# Patient Record
Sex: Female | Born: 1940
Health system: Southern US, Community
[De-identification: ages and names within clinical notes are randomized; demographics above are authoritative.]

## PROBLEM LIST (undated history)

## (undated) DIAGNOSIS — I152 Hypertension secondary to endocrine disorders: Secondary | ICD-10-CM

## (undated) DIAGNOSIS — G4730 Sleep apnea, unspecified: Secondary | ICD-10-CM

## (undated) DIAGNOSIS — K219 Gastro-esophageal reflux disease without esophagitis: Secondary | ICD-10-CM

## (undated) DIAGNOSIS — E785 Hyperlipidemia, unspecified: Secondary | ICD-10-CM

## (undated) DIAGNOSIS — E559 Vitamin D deficiency, unspecified: Secondary | ICD-10-CM

## (undated) DIAGNOSIS — I10 Essential (primary) hypertension: Secondary | ICD-10-CM

## (undated) DIAGNOSIS — E1159 Type 2 diabetes mellitus with other circulatory complications: Secondary | ICD-10-CM

## (undated) DIAGNOSIS — M2550 Pain in unspecified joint: Secondary | ICD-10-CM

## (undated) DIAGNOSIS — M810 Age-related osteoporosis without current pathological fracture: Secondary | ICD-10-CM

## (undated) DIAGNOSIS — F329 Major depressive disorder, single episode, unspecified: Secondary | ICD-10-CM

## (undated) DIAGNOSIS — G8929 Other chronic pain: Secondary | ICD-10-CM

## (undated) DIAGNOSIS — E1169 Type 2 diabetes mellitus with other specified complication: Secondary | ICD-10-CM

## (undated) DIAGNOSIS — I509 Heart failure, unspecified: Secondary | ICD-10-CM

## (undated) DIAGNOSIS — F419 Anxiety disorder, unspecified: Secondary | ICD-10-CM

## (undated) DIAGNOSIS — E7800 Pure hypercholesterolemia, unspecified: Secondary | ICD-10-CM

## (undated) DIAGNOSIS — E039 Hypothyroidism, unspecified: Secondary | ICD-10-CM

## (undated) DIAGNOSIS — F32A Depression, unspecified: Secondary | ICD-10-CM

## (undated) DIAGNOSIS — M1990 Unspecified osteoarthritis, unspecified site: Secondary | ICD-10-CM

## (undated) HISTORY — DX: Pain in unspecified joint: M25.50

## (undated) HISTORY — DX: Essential (primary) hypertension: I10

## (undated) HISTORY — DX: Heart failure, unspecified: I50.9

## (undated) HISTORY — DX: Age-related osteoporosis without current pathological fracture: M81.0

## (undated) HISTORY — DX: Anxiety disorder, unspecified: F41.9

## (undated) HISTORY — PX: BREAST EXCISIONAL BIOPSY: SUR124

## (undated) HISTORY — DX: Type 2 diabetes mellitus with other specified complication: E11.69

## (undated) HISTORY — DX: Unspecified osteoarthritis, unspecified site: M19.90

## (undated) HISTORY — DX: Type 2 diabetes mellitus with other specified complication: E78.00

## (undated) HISTORY — DX: Type 2 diabetes mellitus with other circulatory complications: E11.59

## (undated) HISTORY — DX: Gastro-esophageal reflux disease without esophagitis: K21.9

## (undated) HISTORY — PX: ABDOMINAL HERNIA REPAIR: SHX539

## (undated) HISTORY — DX: Vitamin D deficiency, unspecified: E55.9

## (undated) HISTORY — DX: Other chronic pain: G89.29

## (undated) HISTORY — DX: Hyperlipidemia, unspecified: E78.5

## (undated) HISTORY — DX: Hypertension secondary to endocrine disorders: I15.2

---

## 1975-12-26 HISTORY — PX: ABDOMINAL HYSTERECTOMY: SHX81

## 1999-03-25 ENCOUNTER — Ambulatory Visit (HOSPITAL_COMMUNITY): Admission: RE | Admit: 1999-03-25 | Discharge: 1999-03-25 | Payer: Self-pay | Admitting: Gastroenterology

## 2001-07-29 ENCOUNTER — Ambulatory Visit (HOSPITAL_BASED_OUTPATIENT_CLINIC_OR_DEPARTMENT_OTHER): Admission: RE | Admit: 2001-07-29 | Discharge: 2001-07-29 | Payer: Self-pay | Admitting: General Surgery

## 2001-07-29 ENCOUNTER — Encounter (INDEPENDENT_AMBULATORY_CARE_PROVIDER_SITE_OTHER): Payer: Self-pay | Admitting: *Deleted

## 2001-12-13 ENCOUNTER — Encounter: Admission: RE | Admit: 2001-12-13 | Discharge: 2001-12-13 | Payer: Self-pay | Admitting: Family Medicine

## 2001-12-13 ENCOUNTER — Encounter: Payer: Self-pay | Admitting: Family Medicine

## 2001-12-13 ENCOUNTER — Encounter (INDEPENDENT_AMBULATORY_CARE_PROVIDER_SITE_OTHER): Payer: Self-pay

## 2002-02-25 ENCOUNTER — Encounter: Admission: RE | Admit: 2002-02-25 | Discharge: 2002-02-25 | Payer: Self-pay | Admitting: Family Medicine

## 2002-02-25 ENCOUNTER — Encounter: Payer: Self-pay | Admitting: Family Medicine

## 2002-03-06 ENCOUNTER — Encounter (INDEPENDENT_AMBULATORY_CARE_PROVIDER_SITE_OTHER): Payer: Self-pay | Admitting: Specialist

## 2002-03-06 ENCOUNTER — Ambulatory Visit (HOSPITAL_COMMUNITY): Admission: RE | Admit: 2002-03-06 | Discharge: 2002-03-06 | Payer: Self-pay | Admitting: Gastroenterology

## 2002-12-15 ENCOUNTER — Encounter: Admission: RE | Admit: 2002-12-15 | Discharge: 2002-12-15 | Payer: Self-pay | Admitting: Family Medicine

## 2002-12-15 ENCOUNTER — Encounter: Payer: Self-pay | Admitting: Family Medicine

## 2004-01-01 ENCOUNTER — Encounter: Admission: RE | Admit: 2004-01-01 | Discharge: 2004-01-01 | Payer: Self-pay | Admitting: Family Medicine

## 2005-01-16 ENCOUNTER — Encounter: Admission: RE | Admit: 2005-01-16 | Discharge: 2005-01-16 | Payer: Self-pay | Admitting: Family Medicine

## 2006-02-21 ENCOUNTER — Encounter: Admission: RE | Admit: 2006-02-21 | Discharge: 2006-02-21 | Payer: Self-pay | Admitting: Family Medicine

## 2007-02-26 ENCOUNTER — Encounter: Admission: RE | Admit: 2007-02-26 | Discharge: 2007-02-26 | Payer: Self-pay | Admitting: Unknown Physician Specialty

## 2008-02-27 ENCOUNTER — Encounter: Admission: RE | Admit: 2008-02-27 | Discharge: 2008-02-27 | Payer: Self-pay | Admitting: Family Medicine

## 2008-03-09 ENCOUNTER — Ambulatory Visit: Payer: Self-pay | Admitting: Cardiology

## 2008-03-09 LAB — CONVERTED CEMR LAB: Pro B Natriuretic peptide (BNP): 61 pg/mL (ref 0.0–100.0)

## 2008-03-23 ENCOUNTER — Encounter: Payer: Self-pay | Admitting: Cardiology

## 2008-03-23 ENCOUNTER — Ambulatory Visit: Payer: Self-pay

## 2008-04-15 ENCOUNTER — Ambulatory Visit: Payer: Self-pay | Admitting: Cardiology

## 2009-03-01 ENCOUNTER — Encounter: Admission: RE | Admit: 2009-03-01 | Discharge: 2009-03-01 | Payer: Self-pay | Admitting: Family Medicine

## 2010-03-02 ENCOUNTER — Encounter: Admission: RE | Admit: 2010-03-02 | Discharge: 2010-03-02 | Payer: Self-pay | Admitting: Unknown Physician Specialty

## 2011-02-09 ENCOUNTER — Other Ambulatory Visit: Payer: Self-pay | Admitting: Gastroenterology

## 2011-02-20 ENCOUNTER — Other Ambulatory Visit: Payer: Self-pay | Admitting: Family Medicine

## 2011-02-20 DIAGNOSIS — Z1231 Encounter for screening mammogram for malignant neoplasm of breast: Secondary | ICD-10-CM

## 2011-03-07 ENCOUNTER — Ambulatory Visit
Admission: RE | Admit: 2011-03-07 | Discharge: 2011-03-07 | Disposition: A | Discharge status: Home / Self Care | Payer: BC Managed Care – PPO | Source: Ambulatory Visit | Attending: Family Medicine | Admitting: Family Medicine

## 2011-03-07 DIAGNOSIS — Z1231 Encounter for screening mammogram for malignant neoplasm of breast: Secondary | ICD-10-CM

## 2011-05-09 NOTE — Assessment & Plan Note (Signed)
[facility]                            CARDIOLOGY OFFICE NOTE   NAME:Danielle Parrish, Danielle Parrish                       MRN:          [id number]  DATE:03/09/2008                            DOB:          11/29/1941    PRIMARY:  Danielle Parrish, P.A., [facility].   REASON FOR REFERRAL:  Evaluate patient with new onset dyspnea.   HISTORY OF PRESENT ILLNESS:  The patient is a pleasant 70 year old who  did have a stress test, she reports, many years ago in our office.  This  was apparently a nuclear study, and she thinks it was normal.  I do not  have these results.  She never had any other cardiac history.  She  noticed shortness of breath and decreased exercise tolerance for the  past couple of weeks.  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  She was working with  patients and had to sit down after doing her usual routine.  That day,  she left work early because of this tiredness, and since then she has  continued to have dyspnea with minimal exertion such as walking a level  distance in her house.  She is not describing resting shortness of  breath and has not had any PND or orthopnea.  She has felt very  fatigued.  She has not been having any chest discomfort, neck or arm  discomfort.  She has had no palpitations, presyncope or syncope.  The  patient has not had these symptoms before.  She has had some increased  abdominal swelling but has not had any overall weight gain or lower  extremity swelling.  She has had no fevers, chills or cough.  She does  have sleep apnea and has been tolerating her CPAP and has had no change  in this.   She was recently treated with Norvasc to treat her blood pressure.  She  said this dropped her pressures down to the one teens, and she stopped  this on her own.   PAST MEDICAL HISTORY:  1. Hypertension x40 years.  2. Hypothyroidism.  3.  Hyperlipidemia.  4. Sleep apnea treated with CPAP.   PAST SURGICAL HISTORY:  1. Hysterectomy.  2. Hernia repair.  3. Breast biopsy.   ALLERGIES:  None.   MEDICATIONS:  1. Zoloft 200 mg daily.  2. Benazepril 40 mg daily.  3. Atenolol 100 mg daily.  4. Synthroid 100 mcg daily.  5. Lovastatin 40 mg q.h.s.  6. Niaspan 500 mg q.h.s.  7. Hydrochlorothiazide 25 mg daily.  8. Aspirin 81 mg daily.  9. Fish oil.  10.Iron.  11.Calcium.   SOCIAL HISTORY:  The patient is a CNA.  She is widowed.  She has 2  children.  She has never smoked cigarettes, does not drink alcohol.   FAMILY HISTORY:  Noncontributory for early coronary artery disease.   REVIEW OF SYSTEMS:  As stated in the HPI and otherwise negative for  other systems.   PHYSICAL EXAMINATION:  GENERAL:  The patient  is in no distress.  VITAL SIGNS:  Blood pressure 186/89, heart rate 52 and regular, weight  194 pounds, body mass index 34.  HEENT:  Eyelids are unremarkable.  Pupils equal, round and reactive to  light.  Fundi within normal limits.  Oral mucosa unremarkable.  NECK:  No jugular distention at 45 degrees.  Carotid upstroke brisk and  symmetric.  No bruits, no thyromegaly.  LYMPHATICS:  No cervical, axillary or inguinal adenopathy.  LUNGS:  Clear to auscultation bilaterally.  BACK:  No costovertebral angle tenderness.  CHEST:  Unremarkable.  HEART:  PMI not displaced or sustained.  S1-S2 within normal limits.  No  S3, no S4, no clicks, rubs or murmurs.  ABDOMEN:  Obese, positive bowel sounds.  Normal in frequency and pitch.  No bruits, rebound, guarding.  No midline pulsatile mass.  No  hepatomegaly, no splenomegaly.  SKIN:  No rashes, no nodules.  EXTREMITIES:  2+ pulses throughout.  No edema, no cyanosis, no clubbing.  NEUROLOGIC:  Oriented to person, place and time.  Cranial nerves II-XII  grossly intact.  Motor grossly intact.   ELECTROCARDIOGRAM:  Sinus bradycardia, rate 52.  Axis within normal  limits,  intervals within normal limits.  No acute ST-T wave changes.   ASSESSMENT/PLAN:  1. Acute dyspnea.  The patient had the sudden onset of dyspnea without      other obvious change.  At this point, this could be an anginal      equivalent.  I am going to order a stress perfusion study.  She      would not be able to walk on a treadmill, so this will have to be      an adenosine Cardiolite.  I will check a BNP level, as well.  I      will also check an echocardiogram.  There is some vague history of      some heart failure but apparently with a preserved ejection      fraction.  I suspect that she probably has some diastolic      dysfunction, given her hypertension.  Further evaluation will be      based on these results.  2. Hypertension.  Blood pressure is elevated today.  I have encouraged      her to restart 2.5 mg of Norvasc which she was given.  3. Obesity.  She understands the need to lose weight with diet and      exercise, and we will talk about this again in the future.  4. Dyslipidemia.  This is under careful follow up of [facility].  5. Sleep apnea.  She continues to wear a CPAP.  6. Follow up will be after the results of the above.     Rollene Rotunda, MD, [facility]  Electronically Signed    JH/MedQ  DD: 03/09/2008  DT: 03/09/2008  Job #: [id number]   cc:   Danielle Parrish, P.A.

## 2011-05-09 NOTE — Assessment & Plan Note (Signed)
[facility]                            CARDIOLOGY OFFICE NOTE   NAME:Danielle Parrish, Danielle Parrish                       MRN:          [id number]  DATE:04/15/2008                            DOB:          [birth year]    PRIMARY CARE:  [name], P.A., [facility].   REASON FOR PRESENTATION:  Evaluate patient with dyspnea.   HISTORY OF PRESENT ILLNESS:  This is the second office visit for this  lovely, 70 year old white female, with dyspnea as described in the  previous note.  After that visit, I did perform an echocardiogram, which  demonstrated an EF of 55-65%.  There was some very mild aortic valve  thickness with a slight gradient and some trivial aortic insufficiency.  She had a stress perfusion study, which suggested possibly apical  ischemia, but a well preserved ejection fraction.  Her BNP level was 61.  Since  that time the patient has had none of the acute symptoms that she  had the evening at work.  She thinks her breathing has slowly gotten  better.  She did start her Norvasc and has had much better blood  pressure control.  She has not had any acute episodes of resting  shortness of breath.  She denies any PND or orthopnea.  She has had no  palpitations, no presyncope or syncope.  She denies any chest  discomfort, neck or arm discomfort.   PAST MEDICAL HISTORY:  Hypertension x40 years, hypothyroidism,  hyperlipidemia, sleep apnea treated with CPAP, hysterectomy, hernia  repair, breast biopsy.   ALLERGIES:  None.   MEDICATIONS:  1. Zoloft 200 mg daily.  2. Benazepril 40 mg daily.  3. Atenolol 100 mg daily.  4. Synthroid 100 mcg daily.  5. Lovastatin 80 mg daily.  6. Niaspan 500 mg nightly.  7. Hydrochlorothiazide 25 mg daily.  8. Aspirin 81 mg daily.  9. Fish oil,  10.Iron.  11.Calcium.  12.CPAP.  13.Norvasc 2.5 mg daily.   REVIEW OF SYSTEMS:  As stated in the HPI.  Negative for other systems.   PHYSICAL  EXAMINATION:  Patient is in no distress.  Blood pressure  110/68, heart rate 64, regular.  Weight 193 pounds.  Body mass index 34.  HEENT: Eyes unremarkable, pupils equal, round and reactive to light.  Fundi not visualized.  Oral mucosa unremarkable.  NECK:  No jugular venous distension at 45 degrees, carotid upstroke  brisk and symmetric.  No bruits, no thyromegaly.  LYMPHATICS:  No cervical, axillary, inguinal adenopathy.  LUNGS:  Clear to auscultation bilaterally.  BACK: No costovertebral angle tenderness.  CHEST:  Unremarkable.  HEART:  PMI not displaced or sustained.  S1, S2 within normal limits  No  S3, no S4, clicks, rubs or murmurs.  ABDOMEN:  Obese.  Positive bowel sounds normal in frequency and pitch.  No bruits, rebound, no guarding.  No midline pulsatile mass.  No  organomegaly.  SKIN:  No rashes.  EXTREMITIES:  2+ pulses.  No edema.   ASSESSMENT/PLAN:  1. Dyspnea.  The patient's dyspnea does not have a cardiac etiology  aside from the possibility of some mild diastolic dysfunction.  It      could be related to weight, sedentary lifestyle.  At this point no      further cardiovascular testing or specific therapy is warranted.  I      did suggest to her that she might want to see a pulmonologist.  She      said she did have a chest x-ray, which she thinks was normal.  I      asked her to follow up on this.  She will otherwise see Mr. Jacqulyn Bath to      consider any further evaluation or management.  2. Aortic stenosis.  Patient has a very mild aortic stenosis.  This      can be followed clinically and with repeat echocardiograms in a few      years.  3. Obesity.  She understands the need to lose weight with diet and      exercise.  4. Will be back in this clinic as needed.     Rollene Rotunda, MD, [facility]  Electronically Signed    JH/MedQ  DD: 04/15/2008  DT: 04/15/2008  Job #: [id number]

## 2011-05-12 NOTE — Op Note (Signed)
[place]  Patient:    Danielle Parrish, Danielle Parrish                       MRN: [id number] Proc. Date: 07/29/01 Adm. Date:  [id number] Attending:  Brandy Hale CC:         Gweneth Dimitri, M.D., [facility]   Operative Report  PREOPERATIVE DIAGNOSIS:  Cyst of scalp.  POSTOPERATIVE DIAGNOSIS:  Cyst of scalp.  PROCEDURE:  Excision of 2.0 cm diameter cyst of scalp.  SURGEON:  Angelia Mould. Derrell Lolling, M.D.  OPERATIVE INDICATION:  This is a 70 year old white female who has had a mass on the scalp at the vertex for many, many years.  This has never been infected, but it has been enlarging and has become more tender lately.  On examination, she has a 2-2.5 cm prominent subcutaneous mass at the vertex of the scalp consistent with a benign chronic subcutaneous cyst.  There is no other skin change, but this is quite large.  She is brought to the operating room electively.  DESCRIPTION OF PROCEDURE:  The patient was brought to [facility] day surgery center to the minor surgery room.  She was placed prone on the operating table with appropriate pillows for cushioning.  The hair was shaved around the prominent mass at the vertex of the scalp.  This was prepped and draped in a sterile fashion.  Xylocaine 1% with epinephrine was used as a local infiltration anesthetic.  A sagittally-oriented elliptical incision was made.  Using sharp dissection I excised a large, thick-walled, chronic sebaceous cyst.  This was sent to pathology for routine histology.  There was a fair amount of bleeding from the scalp vessels.  This required suture ligature of one of these vessels with a 4-0 Vicryl suture ligature.  Electrocautery was used to control the rest of the bleeding.  At the completion of the case there was excellent hemostasis.  The wound was irrigated.  The skin was closed with a running horizontal mattress suture of 3-0 nylon, and Durabond was used to cover  the skin.  The patient tolerated the procedure well and was returned to the waiting room in excellent condition.  Estimated blood loss was about 30 cc. Complications:  None.  Sponge, needle, and instrument counts were correct. DD:  07/29/01 TD:  07/30/01 Job: [id number]

## 2011-05-12 NOTE — Procedures (Signed)
[facility]  Patient:    Danielle Parrish, Danielle Parrish Visit Number: [id number] MRN: [id number]          Service Type: END Location: ENDO Attending Physician:  Louie Bun Dictated by:   Everardo All Madilyn Fireman, M.D. Proc. Date: 03/06/02 Admit Date:  03/06/2002   CC:         Gweneth Dimitri, M.D.   Procedure Report  PROCEDURE:  Colonoscopy with polypectomy.  INDICATION FOR PROCEDURE:  Rectal bleeding in a 70 year old patient with no previous colon screening.  DESCRIPTION OF PROCEDURE:  The patient was placed in the left lateral decubitus position and placed on the pulse monitor with continuous low-flow oxygen delivered by nasal cannula.  She was sedated with 50 mg of IV Demerol and 5 mg of IV Versed.  The Olympus video colonoscope was inserted into the rectum and advanced to the cecum, confirmed by transillumination at McBurneys point and visualization of the ileocecal valve and appendiceal orifice.  The prep was excellent.  The cecum and ascending colon appeared normal with no no masses, polyps, diverticula, or other mucosal abnormalities.  Within the transverse colon, there was a superficial polyp which was removed by hot biopsy.  This was approximately 6 mm in diameter.  The remainder of the transverse, descending, and sigmoid colon appeared normal with no further polyps, masses, diverticula, or other mucosa abnormalities.  The rectum likewise appeared normal, and retroflexed view of the anus revealed small internal hemorrhoids.  The colonoscope was then withdrawn, and the patient returned to the recovery room in stable condition.  She tolerated the procedure well, and there were no immediate complications.  IMPRESSION: 1. Small transverse colon polyp. 2. Internal hemorrhoids.  PLAN:  Treat hemorrhoids symptomatically and await histology on the polyp for determination of method and interval for future colons screening. Dictated by:   Everardo All Madilyn Fireman,  M.D. Attending Physician:  Louie Bun DD:  03/06/02 TD:  03/08/02 Job: [id number]

## 2012-03-20 ENCOUNTER — Other Ambulatory Visit: Payer: Self-pay | Admitting: Family Medicine

## 2012-03-20 DIAGNOSIS — Z1231 Encounter for screening mammogram for malignant neoplasm of breast: Secondary | ICD-10-CM

## 2012-04-10 ENCOUNTER — Ambulatory Visit
Admission: RE | Admit: 2012-04-10 | Discharge: 2012-04-10 | Disposition: A | Discharge status: Home / Self Care | Payer: BC Managed Care – PPO | Source: Ambulatory Visit | Attending: Family Medicine | Admitting: Family Medicine

## 2012-04-10 DIAGNOSIS — Z1231 Encounter for screening mammogram for malignant neoplasm of breast: Secondary | ICD-10-CM

## 2012-04-12 ENCOUNTER — Other Ambulatory Visit: Payer: Self-pay | Admitting: Family Medicine

## 2012-04-12 DIAGNOSIS — R928 Other abnormal and inconclusive findings on diagnostic imaging of breast: Secondary | ICD-10-CM

## 2012-04-16 DIAGNOSIS — H538 Other visual disturbances: Secondary | ICD-10-CM | POA: Diagnosis not present

## 2012-04-16 DIAGNOSIS — H2510 Age-related nuclear cataract, unspecified eye: Secondary | ICD-10-CM | POA: Diagnosis not present

## 2012-04-22 ENCOUNTER — Other Ambulatory Visit: Payer: Self-pay | Admitting: Family Medicine

## 2012-04-22 ENCOUNTER — Ambulatory Visit
Admission: RE | Admit: 2012-04-22 | Discharge: 2012-04-22 | Disposition: A | Discharge status: Home / Self Care | Payer: BC Managed Care – PPO | Source: Ambulatory Visit | Attending: Family Medicine | Admitting: Family Medicine

## 2012-04-22 DIAGNOSIS — N63 Unspecified lump in unspecified breast: Secondary | ICD-10-CM | POA: Diagnosis not present

## 2012-04-22 DIAGNOSIS — R928 Other abnormal and inconclusive findings on diagnostic imaging of breast: Secondary | ICD-10-CM

## 2012-05-01 ENCOUNTER — Ambulatory Visit
Admission: RE | Admit: 2012-05-01 | Discharge: 2012-05-01 | Disposition: A | Discharge status: Home / Self Care | Payer: BC Managed Care – PPO | Source: Ambulatory Visit | Attending: Family Medicine | Admitting: Family Medicine

## 2012-05-01 ENCOUNTER — Ambulatory Visit
Admission: RE | Admit: 2012-05-01 | Discharge: 2012-05-01 | Disposition: A | Discharge status: Home / Self Care | Payer: Medicare Other | Source: Ambulatory Visit | Attending: Family Medicine | Admitting: Family Medicine

## 2012-05-01 DIAGNOSIS — N63 Unspecified lump in unspecified breast: Secondary | ICD-10-CM | POA: Diagnosis not present

## 2012-05-01 DIAGNOSIS — D249 Benign neoplasm of unspecified breast: Secondary | ICD-10-CM | POA: Diagnosis not present

## 2012-05-10 DIAGNOSIS — E785 Hyperlipidemia, unspecified: Secondary | ICD-10-CM | POA: Diagnosis not present

## 2012-05-10 DIAGNOSIS — I10 Essential (primary) hypertension: Secondary | ICD-10-CM | POA: Diagnosis not present

## 2012-05-10 DIAGNOSIS — E119 Type 2 diabetes mellitus without complications: Secondary | ICD-10-CM | POA: Diagnosis not present

## 2012-05-10 DIAGNOSIS — E039 Hypothyroidism, unspecified: Secondary | ICD-10-CM | POA: Diagnosis not present

## 2012-08-09 DIAGNOSIS — I10 Essential (primary) hypertension: Secondary | ICD-10-CM | POA: Diagnosis not present

## 2012-08-09 DIAGNOSIS — E119 Type 2 diabetes mellitus without complications: Secondary | ICD-10-CM | POA: Diagnosis not present

## 2013-02-17 DIAGNOSIS — R5381 Other malaise: Secondary | ICD-10-CM | POA: Diagnosis not present

## 2013-02-17 DIAGNOSIS — E785 Hyperlipidemia, unspecified: Secondary | ICD-10-CM | POA: Diagnosis not present

## 2013-02-17 DIAGNOSIS — I10 Essential (primary) hypertension: Secondary | ICD-10-CM | POA: Diagnosis not present

## 2013-02-17 DIAGNOSIS — R5383 Other fatigue: Secondary | ICD-10-CM | POA: Diagnosis not present

## 2013-02-26 DIAGNOSIS — M79609 Pain in unspecified limb: Secondary | ICD-10-CM | POA: Diagnosis not present

## 2013-02-26 DIAGNOSIS — I10 Essential (primary) hypertension: Secondary | ICD-10-CM | POA: Diagnosis not present

## 2013-02-26 DIAGNOSIS — M7120 Synovial cyst of popliteal space [Baker], unspecified knee: Secondary | ICD-10-CM | POA: Diagnosis not present

## 2013-03-27 ENCOUNTER — Telehealth: Payer: Self-pay | Admitting: Family Medicine

## 2013-04-01 ENCOUNTER — Other Ambulatory Visit: Payer: Self-pay | Admitting: Family Medicine

## 2013-04-01 ENCOUNTER — Telehealth: Payer: Self-pay

## 2013-04-01 MED ORDER — HYDROCODONE-ACETAMINOPHEN 7.5-325 MG PO TABS: 1.0000 | ORAL_TABLET | Freq: Four times a day (QID) | ORAL | Status: DC | PRN

## 2013-04-01 NOTE — Telephone Encounter (Signed)
Spoke to grandson informed him her rx  hydrocodone ready for pick up He stated he would give her the message

## 2013-04-01 NOTE — Telephone Encounter (Signed)
Spoke with pt results given  appt with dr Modesto Charon Apr 27 2013

## 2013-04-01 NOTE — Telephone Encounter (Signed)
Noncompliant with follow up appointments. Needs OV to check his BP.

## 2013-04-01 NOTE — Telephone Encounter (Signed)
Last seen 02/17/13

## 2013-04-03 NOTE — Telephone Encounter (Signed)
Spoke wqith pt stated she already picked up rx and does not need another rx  Dr Modesto Charon aware.

## 2013-04-07 ENCOUNTER — Encounter: Payer: Self-pay | Admitting: Family Medicine

## 2013-04-28 ENCOUNTER — Other Ambulatory Visit: Payer: Self-pay | Admitting: *Deleted

## 2013-04-28 MED ORDER — HYDROCODONE-ACETAMINOPHEN 7.5-325 MG PO TABS: 1.0000 | ORAL_TABLET | Freq: Four times a day (QID) | ORAL | Status: DC | PRN

## 2013-04-28 NOTE — Telephone Encounter (Signed)
Patient last seen in office on 2-24.  Rx last filled on 4-9 for #45. Please advise. If approved please have nurse phone in to [facility] pharmacy. Thank you

## 2013-05-01 ENCOUNTER — Telehealth: Payer: Self-pay | Admitting: Family Medicine

## 2013-05-02 NOTE — Telephone Encounter (Signed)
rx at front desk and was written on may 5 Pt aware rx ready for pick up

## 2013-05-15 ENCOUNTER — Ambulatory Visit (INDEPENDENT_AMBULATORY_CARE_PROVIDER_SITE_OTHER): Payer: Commercial Indemnity | Admitting: Family Medicine

## 2013-05-15 ENCOUNTER — Encounter: Payer: Self-pay | Admitting: Family Medicine

## 2013-05-15 VITALS — BP 154/78 | HR 54 | Temp 97.4°F | Ht 62.0 in | Wt 177.0 lb

## 2013-05-15 DIAGNOSIS — E669 Obesity, unspecified: Secondary | ICD-10-CM

## 2013-05-15 DIAGNOSIS — I10 Essential (primary) hypertension: Secondary | ICD-10-CM

## 2013-05-15 DIAGNOSIS — R7309 Other abnormal glucose: Secondary | ICD-10-CM

## 2013-05-15 DIAGNOSIS — E1159 Type 2 diabetes mellitus with other circulatory complications: Secondary | ICD-10-CM | POA: Insufficient documentation

## 2013-05-15 DIAGNOSIS — R7303 Prediabetes: Secondary | ICD-10-CM

## 2013-05-15 DIAGNOSIS — E1169 Type 2 diabetes mellitus with other specified complication: Secondary | ICD-10-CM | POA: Insufficient documentation

## 2013-05-15 DIAGNOSIS — E785 Hyperlipidemia, unspecified: Secondary | ICD-10-CM

## 2013-05-15 DIAGNOSIS — I152 Hypertension secondary to endocrine disorders: Secondary | ICD-10-CM | POA: Insufficient documentation

## 2013-05-15 DIAGNOSIS — E039 Hypothyroidism, unspecified: Secondary | ICD-10-CM | POA: Diagnosis not present

## 2013-05-15 LAB — COMPLETE METABOLIC PANEL WITH GFR
ALT: 23 U/L (ref 0–35)
AST: 25 U/L (ref 0–37)
Albumin: 4.3 g/dL (ref 3.5–5.2)
Alkaline Phosphatase: 43 U/L (ref 39–117)
BUN: 10 mg/dL (ref 6–23)
CO2: 27 mEq/L (ref 19–32)
Calcium: 9.8 mg/dL (ref 8.4–10.5)
Chloride: 101 mEq/L (ref 96–112)
Creat: 0.86 mg/dL (ref 0.50–1.10)
GFR, Est African American: 78 mL/min
GFR, Est Non African American: 68 mL/min
Glucose, Bld: 101 mg/dL — ABNORMAL HIGH (ref 70–99)
Potassium: 4.1 mEq/L (ref 3.5–5.3)
Sodium: 138 mEq/L (ref 135–145)
Total Bilirubin: 0.5 mg/dL (ref 0.3–1.2)
Total Protein: 6.6 g/dL (ref 6.0–8.3)

## 2013-05-15 LAB — POCT GLYCOSYLATED HEMOGLOBIN (HGB A1C): Hemoglobin A1C: 5.8

## 2013-05-15 LAB — TSH: TSH: 1.917 u[IU]/mL (ref 0.350–4.500)

## 2013-05-15 MED ORDER — FENOFIBRATE 160 MG PO TABS: 160.0000 mg | ORAL_TABLET | Freq: Every day | ORAL | Status: DC

## 2013-05-15 NOTE — Progress Notes (Signed)
Patient ID: Danielle Parrish, female   DOB: [birth year], 72 y.o.   MRN: [id number] SUBJECTIVE: HPI: Patient is here for follow up of hypertension, Hypertension/Prediabetes:  denies Headache;deniesChest Pain;denies weakness;denies Shortness of Breath or Orthopnea;denies Visual changes;denies palpitations;denies cough;denies pedal edema;denies symptoms of TIA or stroke; admits to Compliance with medications. denies Problems with medications.  Quit drinking Dr Alcus Dad. And this  Seems to have helped with her weight loss. The legs don't hurt anymore. And she feels better.  PMH/PSH: reviewed/updated in Epic  SH/FH: reviewed/updated in Epic  Allergies: reviewed/updated in Epic  Medications: reviewed/updated in Epic  Immunizations: reviewed/updated in Epic  ROS: As above in the HPI. All other systems are stable or negative.  OBJECTIVE: APPEARANCE:  Patient in no acute distress.The patient appeared well nourished and normally developed. Acyanotic. Waist: VITAL SIGNS:BP 154/78  Pulse 54  Temp(Src) 97.4 F (36.3 C) (Oral)  Ht 5\' 2"  (1.575 m)  Wt 177 lb (80.287 kg)  BMI 32.37 kg/m2  140/84 recheck WF Obese  Weight reduced 12 lbs.  SKIN: warm and  Dry without overt rashes, tattoos and scars  HEAD and Neck: without JVD, Head and scalp: normal Eyes:No scleral icterus. Fundi normal, eye movements normal. Ears: Auricle normal, canal normal, Tympanic membranes normal, insufflation normal. Nose: normal Throat: normal Neck & thyroid: normal  CHEST & LUNGS: Chest wall: normal Lungs: Clear  CVS: Reveals the PMI to be normally located. Regular rhythm, First and Second Heart sounds are normal,  absence of murmurs, rubs or gallops. Peripheral vasculature: Radial pulses: normal Dorsal pedis pulses: normal Posterior pulses: normal  ABDOMEN:  Appearance: obese soft Benign,, no organomegaly, no masses, no Abdominal Aortic enlargement. No Guarding , no rebound. No Bruits. Bowel  sounds: normal  RECTAL: N/A GU: N/A  EXTREMETIES: nonedematous. Both Femoral and Pedal pulses are normal.  MUSCULOSKELETAL:  Spine:mild kyphosis Joints: intact  NEUROLOGIC: oriented to time,place and person; nonfocal. Strength is normal Sensory is normal  ASSESSMENT: HTN (hypertension) - Plan: COMPLETE METABOLIC PANEL WITH GFR  HLD (hyperlipidemia) - Plan: COMPLETE METABOLIC PANEL WITH GFR, NMR Lipoprofile with Lipids, fenofibrate 160 MG tablet  Prediabetes - Plan: COMPLETE METABOLIC PANEL WITH GFR, POCT glycosylated hemoglobin (Hb A1C)  Obesity, unspecified  Unspecified hypothyroidism - Plan: TSH   PLAN: Orders Placed This Encounter  Procedures  . TSH  . COMPLETE METABOLIC PANEL WITH GFR  . NMR Lipoprofile with Lipids  . POCT glycosylated hemoglobin (Hb A1C)   Meds ordered this encounter  Medications  . atenolol (TENORMIN) 100 MG tablet    Sig: Take 100 mg by mouth daily.  . benazepril (LOTENSIN) 40 MG tablet    Sig: Take 40 mg by mouth daily.  Marland Kitchen DISCONTD: fenofibrate 160 MG tablet    Sig: Take 160 mg by mouth daily.  . sertraline (ZOLOFT) 100 MG tablet    Sig: Take 100 mg by mouth daily. Take 2 tabs by mouth every day  . levothyroxine (SYNTHROID, LEVOTHROID) 100 MCG tablet    Sig: Take 100 mcg by mouth daily before breakfast.  . omeprazole (PRILOSEC) 40 MG capsule    Sig: Take 40 mg by mouth daily.  . Multiple Vitamin (MULTIVITAMIN) tablet    Sig: Take 1 tablet by mouth daily.  . ergocalciferol (VITAMIN D2) 50000 UNITS capsule    Sig: Take 50,000 Units by mouth once a week.  . lovastatin (MEVACOR) 40 MG tablet    Sig: Take 40 mg by mouth at bedtime. Take 2 tablets by mouth at bedtime  .  amLODipine (NORVASC) 5 MG tablet    Sig: Take 5 mg by mouth daily.  Marland Kitchen aspirin 81 MG tablet    Sig: Take 81 mg by mouth daily.  . fenofibrate 160 MG tablet    Sig: Take 1 tablet (160 mg total) by mouth daily.    Dispense:  30 tablet    Refill:  11    Diet and exercise  discussed. Recommend trying to be vegetarian 2 to 3 times a week to see if this will help her to improve her weight loss and health. 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 Modesto Charon, M.D.

## 2013-05-15 NOTE — Progress Notes (Signed)
Patient ID: Danielle Parrish, female   DOB: January 08, 1941, 72 y.o.   MRN: [id number] Patient had not taken her BP meds last night. She was busy working so BP was elevated today.  Her BPs at work and home are normal 124/64 136/67 133 79 124/59 118/57  A.  HTN excellent control  P As in body of the visit note.  Shaquisha Wynn P. Modesto Charon, M.D.

## 2013-05-16 LAB — NMR LIPOPROFILE WITH LIPIDS
Cholesterol, Total: 150 mg/dL (ref <200)
HDL Particle Number: 39.5 umol/L (ref >=30.5)
HDL Size: 9.3 nm (ref >=9.2)
HDL-C: 48 mg/dL (ref >=40)
LDL (calc): 77 mg/dL (ref <100)
LDL Particle Number: 1061 nmol/L — ABNORMAL HIGH (ref <1000)
LDL Size: 20.3 nm — ABNORMAL LOW (ref >20.5)
LP-IR Score: 56 — ABNORMAL HIGH (ref <=45)
Large HDL-P: 4.8 umol/L (ref >=4.8)
Large VLDL-P: 3.9 nmol/L — ABNORMAL HIGH (ref <=2.7)
Small LDL Particle Number: 687 nmol/L — ABNORMAL HIGH (ref <=527)
Triglycerides: 123 mg/dL (ref <150)
VLDL Size: 51.5 nm — ABNORMAL HIGH (ref <=46.6)

## 2013-05-17 NOTE — Progress Notes (Signed)
Quick Note:  Lab result at goal. No change in Medications for now. No Change in plans and follow up. ______ 

## 2013-05-20 ENCOUNTER — Encounter: Payer: Self-pay | Admitting: *Deleted

## 2013-05-23 ENCOUNTER — Other Ambulatory Visit: Payer: Self-pay | Admitting: Family Medicine

## 2013-05-23 DIAGNOSIS — N6320 Unspecified lump in the left breast, unspecified quadrant: Secondary | ICD-10-CM

## 2013-05-26 ENCOUNTER — Other Ambulatory Visit: Payer: Self-pay

## 2013-05-26 MED ORDER — AMLODIPINE BESYLATE 5 MG PO TABS: 5.0000 mg | ORAL_TABLET | Freq: Every day | ORAL | Status: DC

## 2013-05-26 MED ORDER — LOVASTATIN 40 MG PO TABS: 40.0000 mg | ORAL_TABLET | Freq: Every day | ORAL | Status: DC

## 2013-05-26 MED ORDER — LEVOTHYROXINE SODIUM 100 MCG PO TABS: 100.0000 ug | ORAL_TABLET | Freq: Every day | ORAL | Status: DC

## 2013-06-04 ENCOUNTER — Ambulatory Visit
Admission: RE | Admit: 2013-06-04 | Discharge: 2013-06-04 | Disposition: A | Discharge status: Home / Self Care | Payer: Medicare Other | Source: Ambulatory Visit | Attending: Family Medicine | Admitting: Family Medicine

## 2013-06-04 DIAGNOSIS — N6320 Unspecified lump in the left breast, unspecified quadrant: Secondary | ICD-10-CM

## 2013-06-04 DIAGNOSIS — R928 Other abnormal and inconclusive findings on diagnostic imaging of breast: Secondary | ICD-10-CM | POA: Diagnosis not present

## 2013-06-06 ENCOUNTER — Other Ambulatory Visit: Payer: Self-pay | Admitting: *Deleted

## 2013-06-06 NOTE — Telephone Encounter (Signed)
Last seen 05/15/13, last filled 04/28/13. Have Danielle Parrish H to call pt to pick up

## 2013-06-09 ENCOUNTER — Other Ambulatory Visit: Payer: Self-pay | Admitting: *Deleted

## 2013-06-09 MED ORDER — BENAZEPRIL HCL 40 MG PO TABS: 40.0000 mg | ORAL_TABLET | Freq: Every day | ORAL | Status: DC

## 2013-06-09 MED ORDER — HYDROCODONE-ACETAMINOPHEN 7.5-325 MG PO TABS: 1.0000 | ORAL_TABLET | Freq: Four times a day (QID) | ORAL | Status: DC | PRN

## 2013-06-16 ENCOUNTER — Other Ambulatory Visit: Payer: Self-pay | Admitting: *Deleted

## 2013-06-16 DIAGNOSIS — I10 Essential (primary) hypertension: Secondary | ICD-10-CM | POA: Diagnosis not present

## 2013-06-16 DIAGNOSIS — Z87898 Personal history of other specified conditions: Secondary | ICD-10-CM | POA: Diagnosis not present

## 2013-06-16 DIAGNOSIS — M7989 Other specified soft tissue disorders: Secondary | ICD-10-CM | POA: Diagnosis not present

## 2013-06-16 DIAGNOSIS — M79609 Pain in unspecified limb: Secondary | ICD-10-CM | POA: Diagnosis not present

## 2013-06-16 DIAGNOSIS — I824Z9 Acute embolism and thrombosis of unspecified deep veins of unspecified distal lower extremity: Secondary | ICD-10-CM | POA: Diagnosis not present

## 2013-06-16 DIAGNOSIS — Z7982 Long term (current) use of aspirin: Secondary | ICD-10-CM | POA: Diagnosis not present

## 2013-06-16 DIAGNOSIS — Z79899 Other long term (current) drug therapy: Secondary | ICD-10-CM | POA: Diagnosis not present

## 2013-06-16 MED ORDER — ERGOCALCIFEROL 1.25 MG (50000 UT) PO CAPS: 50000.0000 [IU] | ORAL_CAPSULE | ORAL | Status: DC

## 2013-06-25 ENCOUNTER — Other Ambulatory Visit: Payer: Self-pay | Admitting: Family Medicine

## 2013-07-07 ENCOUNTER — Other Ambulatory Visit: Payer: Self-pay

## 2013-07-07 NOTE — Telephone Encounter (Signed)
Last filled 06/06/13  Last seen 05/15/13  FPW   If approved print and have nurse call patient to pick up

## 2013-07-08 MED ORDER — HYDROCODONE-ACETAMINOPHEN 7.5-325 MG PO TABS: 1.0000 | ORAL_TABLET | Freq: Four times a day (QID) | ORAL | Status: DC | PRN

## 2013-07-15 ENCOUNTER — Other Ambulatory Visit: Payer: Self-pay | Admitting: *Deleted

## 2013-07-15 MED ORDER — HYDROCHLOROTHIAZIDE 25 MG PO TABS: 25.0000 mg | ORAL_TABLET | Freq: Every day | ORAL | Status: DC

## 2013-07-15 NOTE — Telephone Encounter (Signed)
Patient last seen in office on 5-22. Rx last filled on 04-18-13. Med not on medication list. Please advise

## 2013-08-08 ENCOUNTER — Other Ambulatory Visit: Payer: Self-pay | Admitting: *Deleted

## 2013-08-08 NOTE — Telephone Encounter (Signed)
Last filled 07/07/13, last seen 05/15/13 Will print, call pt. (Danielle Parrish)

## 2013-08-11 NOTE — Telephone Encounter (Signed)
[place] to address 

## 2013-08-11 NOTE — Telephone Encounter (Signed)
Please check with patient  Directly if she uses this. iIsaw her in the past and she did not need a refill. And son received the message and came for Rx the last refill after I saw her? Potential for diversion???

## 2013-08-14 NOTE — Telephone Encounter (Signed)
Spoke with pt this am and she states she still needs the hydrocodone and at the time of last visit ;she told Dr Modesto Charon she didn't need it but "meant I didn't need refill at that time . [facility] already [facility] her to pick up her rx

## 2013-08-14 NOTE — Telephone Encounter (Signed)
Patient already picked up.

## 2013-08-22 ENCOUNTER — Other Ambulatory Visit: Payer: Self-pay | Admitting: Family Medicine

## 2013-08-22 ENCOUNTER — Telehealth: Payer: Self-pay | Admitting: Family Medicine

## 2013-08-26 ENCOUNTER — Other Ambulatory Visit: Payer: Self-pay

## 2013-08-26 MED ORDER — HYDROCODONE-ACETAMINOPHEN 7.5-325 MG PO TABS: 1.0000 | ORAL_TABLET | Freq: Four times a day (QID) | ORAL | Status: DC | PRN

## 2013-08-26 NOTE — Telephone Encounter (Signed)
Called spoke to [place] and informed him that her rx ready for pick up and also called left message on her voice mail that rx ready

## 2013-08-26 NOTE — Telephone Encounter (Signed)
Rx ready for pick up. 

## 2013-08-26 NOTE — Telephone Encounter (Signed)
Last seen 05/15/13  FPW  Last filled 07/07/13   If approved print and route to nurse

## 2013-09-05 ENCOUNTER — Other Ambulatory Visit: Payer: Self-pay

## 2013-09-05 MED ORDER — ERGOCALCIFEROL 1.25 MG (50000 UT) PO CAPS: 50000.0000 [IU] | ORAL_CAPSULE | ORAL | Status: DC

## 2013-09-05 NOTE — Telephone Encounter (Signed)
Last Vit D level 05/10/12  48 Normal  FPW

## 2013-09-05 NOTE — Telephone Encounter (Signed)
Prescription renewed in EPIC. However, is now due for office visit and may be able to go on OTC dose. Too much can cause kidney stones.

## 2013-09-08 NOTE — Telephone Encounter (Signed)
Left message on pt cell phone

## 2013-09-18 ENCOUNTER — Ambulatory Visit (INDEPENDENT_AMBULATORY_CARE_PROVIDER_SITE_OTHER): Payer: Commercial Indemnity | Admitting: Family Medicine

## 2013-09-18 ENCOUNTER — Encounter: Payer: Self-pay | Admitting: Family Medicine

## 2013-09-18 VITALS — BP 141/67 | HR 46 | Temp 98.7°F | Ht 62.0 in | Wt 176.2 lb

## 2013-09-18 DIAGNOSIS — R7309 Other abnormal glucose: Secondary | ICD-10-CM

## 2013-09-18 DIAGNOSIS — E039 Hypothyroidism, unspecified: Secondary | ICD-10-CM | POA: Diagnosis not present

## 2013-09-18 DIAGNOSIS — E559 Vitamin D deficiency, unspecified: Secondary | ICD-10-CM | POA: Diagnosis not present

## 2013-09-18 DIAGNOSIS — M129 Arthropathy, unspecified: Secondary | ICD-10-CM

## 2013-09-18 DIAGNOSIS — F411 Generalized anxiety disorder: Secondary | ICD-10-CM

## 2013-09-18 DIAGNOSIS — I10 Essential (primary) hypertension: Secondary | ICD-10-CM

## 2013-09-18 DIAGNOSIS — R635 Abnormal weight gain: Secondary | ICD-10-CM | POA: Diagnosis not present

## 2013-09-18 DIAGNOSIS — R7303 Prediabetes: Secondary | ICD-10-CM

## 2013-09-18 DIAGNOSIS — G894 Chronic pain syndrome: Secondary | ICD-10-CM

## 2013-09-18 DIAGNOSIS — F419 Anxiety disorder, unspecified: Secondary | ICD-10-CM | POA: Insufficient documentation

## 2013-09-18 DIAGNOSIS — E785 Hyperlipidemia, unspecified: Secondary | ICD-10-CM

## 2013-09-18 DIAGNOSIS — K219 Gastro-esophageal reflux disease without esophagitis: Secondary | ICD-10-CM

## 2013-09-18 DIAGNOSIS — G8929 Other chronic pain: Secondary | ICD-10-CM

## 2013-09-18 DIAGNOSIS — M1990 Unspecified osteoarthritis, unspecified site: Secondary | ICD-10-CM | POA: Insufficient documentation

## 2013-09-18 DIAGNOSIS — M2550 Pain in unspecified joint: Secondary | ICD-10-CM

## 2013-09-18 DIAGNOSIS — E669 Obesity, unspecified: Secondary | ICD-10-CM

## 2013-09-18 LAB — POCT GLYCOSYLATED HEMOGLOBIN (HGB A1C): Hemoglobin A1C: 5.6

## 2013-09-18 MED ORDER — HYDROCODONE-ACETAMINOPHEN 7.5-325 MG PO TABS: 1.0000 | ORAL_TABLET | Freq: Four times a day (QID) | ORAL | Status: DC | PRN

## 2013-09-18 MED ORDER — FENOFIBRATE 160 MG PO TABS: 160.0000 mg | ORAL_TABLET | Freq: Every day | ORAL | Status: DC

## 2013-09-18 MED ORDER — OMEPRAZOLE 40 MG PO CPDR: DELAYED_RELEASE_CAPSULE | ORAL | Status: DC

## 2013-09-18 MED ORDER — ATENOLOL 100 MG PO TABS: ORAL_TABLET | ORAL | Status: DC

## 2013-09-18 MED ORDER — ERGOCALCIFEROL 1.25 MG (50000 UT) PO CAPS: 50000.0000 [IU] | ORAL_CAPSULE | ORAL | Status: DC

## 2013-09-18 MED ORDER — BENAZEPRIL HCL 40 MG PO TABS: 40.0000 mg | ORAL_TABLET | Freq: Every day | ORAL | Status: DC

## 2013-09-18 MED ORDER — AMLODIPINE BESYLATE 5 MG PO TABS: 5.0000 mg | ORAL_TABLET | Freq: Every day | ORAL | Status: DC

## 2013-09-18 MED ORDER — SERTRALINE HCL 100 MG PO TABS: ORAL_TABLET | ORAL | Status: DC

## 2013-09-18 MED ORDER — LEVOTHYROXINE SODIUM 100 MCG PO TABS: 100.0000 ug | ORAL_TABLET | Freq: Every day | ORAL | Status: DC

## 2013-09-18 MED ORDER — LOVASTATIN 40 MG PO TABS: 40.0000 mg | ORAL_TABLET | Freq: Every day | ORAL | Status: DC

## 2013-09-18 MED ORDER — HYDROCHLOROTHIAZIDE 25 MG PO TABS: 25.0000 mg | ORAL_TABLET | Freq: Every day | ORAL | Status: DC

## 2013-09-18 NOTE — Progress Notes (Signed)
Patient ID: Danielle Parrish, female   DOB: Dec 15, 1941, 72 y.o.   MRN: [id number] SUBJECTIVE: CC: Chief Complaint  Patient presents with  . Follow-up    4 month follow up   . Medication Refill    HPI: Has been okay since last visit. excepy for right leg cramp. Treated in [place] where she works. U/S negative for a blood clot . Received a shot of cortisone and it has been okay since.  Patient is here for follow up of hyperlipidemia/HTN/Prediabetes: denies Headache;denies Chest Pain;denies weakness;denies Shortness of Breath and orthopnea;denies Visual changes;denies palpitations;denies cough;denies pedal edema;denies symptoms of TIA or stroke;deniesClaudication symptoms. admits to Compliance with medications; denies Problems with medications. Doesn't check sugars. Once a week the BP goes high: she sometimes forgets her medications. Other times the BP is normal: 140/60-70s She knows she needs to do better. Works at the hospital in [place].   Past Medical History  Diagnosis Date  . Hypertension   . Diabetes mellitus without complication   . Thyroid disease   . Osteoporosis   . Hyperlipidemia    Past Surgical History  Procedure Laterality Date  . Abdominal hysterectomy    . Abdominal hernia repair     History   Social History  . Marital Status: Married    Spouse Name: N/A    Number of Children: N/A  . Years of Education: N/A   Occupational History  . Not on file.   Social History Main Topics  . Smoking status: Never Smoker   . Smokeless tobacco: Not on file  . Alcohol Use: Not on file  . Drug Use: Not on file  . Sexual Activity: Not on file   Other Topics Concern  . Not on file   Social History Narrative  . No narrative on file   No family history on file. Current Outpatient Prescriptions on File Prior to Visit  Medication Sig Dispense Refill  . amLODipine (NORVASC) 5 MG tablet Take 1 tablet (5 mg total) by mouth daily.  30 tablet  5  . aspirin 81 MG tablet Take 81  mg by mouth daily.      Marland Kitchen atenolol (TENORMIN) 100 MG tablet TAKE ONE TABLET DAILY  30 tablet  4  . benazepril (LOTENSIN) 40 MG tablet Take 1 tablet (40 mg total) by mouth daily.  30 tablet  4  . ergocalciferol (VITAMIN D2) 50000 UNITS capsule Take 1 capsule (50,000 Units total) by mouth once a week.  4 capsule  0  . fenofibrate 160 MG tablet Take 1 tablet (160 mg total) by mouth daily.  30 tablet  11  . hydrochlorothiazide (HYDRODIURIL) 25 MG tablet Take 1 tablet (25 mg total) by mouth daily.  30 tablet  3  . HYDROcodone-acetaminophen (NORCO) 7.5-325 MG per tablet Take 1 tablet by mouth every 6 (six) hours as needed for pain.  45 tablet  0  . levothyroxine (SYNTHROID, LEVOTHROID) 100 MCG tablet Take 1 tablet (100 mcg total) by mouth daily before breakfast.  30 tablet  11  . lovastatin (MEVACOR) 40 MG tablet Take 1 tablet (40 mg total) by mouth at bedtime. Take 2 tablets by mouth at bedtime  30 tablet  5  . Multiple Vitamin (MULTIVITAMIN) tablet Take 1 tablet by mouth daily.      Marland Kitchen omeprazole (PRILOSEC) 40 MG capsule TAKE (1) CAPSULE BY MOUTH EACH MORNING.  30 capsule  4  . sertraline (ZOLOFT) 100 MG tablet TAKE (2) TABLETS BY MOUTH DAILY  60 tablet  0   No current facility-administered medications on file prior to visit.   Allergies  Allergen Reactions  . Crestor [Rosuvastatin]   . Nsaids   . Penicillins   . Vytorin [Ezetimibe-Simvastatin]     There is no immunization history on file for this patient. Prior to Admission medications   Medication Sig Start Date End Date Taking? Authorizing Provider  amLODipine (NORVASC) 5 MG tablet Take 1 tablet (5 mg total) by mouth daily. 05/26/13  Yes Ileana Ladd, MD  aspirin 81 MG tablet Take 81 mg by mouth daily.   Yes Historical Provider, MD  atenolol (TENORMIN) 100 MG tablet TAKE ONE TABLET DAILY 06/25/13  Yes Ileana Ladd, MD  benazepril (LOTENSIN) 40 MG tablet Take 1 tablet (40 mg total) by mouth daily. 06/09/13  Yes Ileana Ladd, MD   calcium-vitamin D (OSCAL WITH D) 500-200 MG-UNIT per tablet Take 1 tablet by mouth.   Yes Historical Provider, MD  diphenhydrAMINE (BENADRYL) 25 MG tablet Take 25 mg by mouth 2 (two) times daily.   Yes Historical Provider, MD  ergocalciferol (VITAMIN D2) 50000 UNITS capsule Take 1 capsule (50,000 Units total) by mouth once a week. 09/05/13  Yes Ileana Ladd, MD  fenofibrate 160 MG tablet Take 1 tablet (160 mg total) by mouth daily. 05/15/13  Yes Ileana Ladd, MD  hydrochlorothiazide (HYDRODIURIL) 25 MG tablet Take 1 tablet (25 mg total) by mouth daily. 07/15/13  Yes Ileana Ladd, MD  HYDROcodone-acetaminophen (NORCO) 7.5-325 MG per tablet Take 1 tablet by mouth every 6 (six) hours as needed for pain. 08/26/13  Yes Ileana Ladd, MD  levothyroxine (SYNTHROID, LEVOTHROID) 100 MCG tablet Take 1 tablet (100 mcg total) by mouth daily before breakfast. 05/26/13  Yes Ileana Ladd, MD  lovastatin (MEVACOR) 40 MG tablet Take 1 tablet (40 mg total) by mouth at bedtime. Take 2 tablets by mouth at bedtime 05/26/13  Yes Ileana Ladd, MD  Multiple Vitamin (MULTIVITAMIN) tablet Take 1 tablet by mouth daily.   Yes Historical Provider, MD  niacin (SLO-NIACIN) 500 MG tablet Take 500 mg by mouth 2 (two) times daily with a meal.   Yes Historical Provider, MD  omeprazole (PRILOSEC) 40 MG capsule TAKE (1) CAPSULE BY MOUTH EACH MORNING. 06/25/13  Yes Ileana Ladd, MD  sertraline (ZOLOFT) 100 MG tablet TAKE (2) TABLETS BY MOUTH DAILY 08/22/13  Yes Ileana Ladd, MD  sertraline (ZOLOFT) 100 MG tablet Take 100 mg by mouth daily.   Yes Historical Provider, MD    ROS: As above in the HPI. All other systems are stable or negative.  OBJECTIVE: APPEARANCE:  Patient in no acute distress.The patient appeared well nourished and normally developed. Acyanotic. Waist: VITAL SIGNS:BP 141/67  Pulse 46  Temp(Src) 98.7 F (37.1 C) (Oral)  Ht 5\' 2"  (1.575 m)  Wt 176 lb 3.2 oz (79.924 kg)  BMI 32.22 kg/m2 WF obese  SKIN:  warm and  Dry without overt rashes, tattoos and scars  HEAD and Neck: without JVD, Head and scalp: normal Eyes:No scleral icterus. Fundi normal, eye movements normal. Ears: Auricle normal, canal normal, Tympanic membranes normal, insufflation normal. Nose: normal Throat: normal Neck & thyroid: normal  CHEST & LUNGS: Chest wall: normal Lungs: Clear  CVS: Reveals the PMI to be normally located. Regular rhythm, First and Second Heart sounds are normal,  absence of murmurs, rubs or gallops. Peripheral vasculature: Radial pulses: normal Dorsal pedis pulses: normal Posterior pulses: normal  ABDOMEN:  Appearance: obese  Benign, no organomegaly, no masses, no Abdominal Aortic enlargement. No Guarding , no rebound. No Bruits. Bowel sounds: normal  RECTAL: N/A GU: N/A  EXTREMETIES: nonedematous.  MUSCULOSKELETAL:  Spine: normal Joints: intact  NEUROLOGIC: oriented to time,place and person; nonfocal. Strength is normal Sensory is normal Reflexes are normal Cranial Nerves are normal.  Results for orders placed in visit on 05/15/13  TSH      Result Value Range   TSH 1.917  0.350 - 4.500 uIU/mL  COMPLETE METABOLIC PANEL WITH GFR      Result Value Range   Sodium 138  135 - 145 mEq/L   Potassium 4.1  3.5 - 5.3 mEq/L   Chloride 101  96 - 112 mEq/L   CO2 27  19 - 32 mEq/L   Glucose, Bld 101 (*) 70 - 99 mg/dL   BUN 10  6 - 23 mg/dL   Creat 9.81  1.91 - 4.78 mg/dL   Total Bilirubin 0.5  0.3 - 1.2 mg/dL   Alkaline Phosphatase 43  39 - 117 U/L   AST 25  0 - 37 U/L   ALT 23  0 - 35 U/L   Total Protein 6.6  6.0 - 8.3 g/dL   Albumin 4.3  3.5 - 5.2 g/dL   Calcium 9.8  8.4 - 29.5 mg/dL   GFR, Est African American 78     GFR, Est Non African American 68    NMR LIPOPROFILE WITH LIPIDS      Result Value Range   LDL Particle Number [id number] (*) <1000 nmol/L   LDL (calc) 77  <100 mg/dL   HDL-C 48  >=62 mg/dL   Triglycerides 130  <865 mg/dL   Cholesterol, Total 784  <200 mg/dL    HDL Particle Number 39.5  >=69.6 umol/L   Large HDL-P 4.8  >=4.8 umol/L   Large VLDL-P 3.9 (*) <=2.7 nmol/L   Small LDL Particle Number [id number] (*) <=527 nmol/L   LDL Size 20.3 (*) >20.5 nm   HDL Size 9.3  >=9.2 nm   VLDL Size 51.5 (*) <=46.6 nm   LP-IR Score 56 (*) <=45  POCT GLYCOSYLATED HEMOGLOBIN (HGB A1C)      Result Value Range   Hemoglobin A1C 5.8%      ASSESSMENT: HLD (hyperlipidemia) - Plan: fenofibrate 160 MG tablet, lovastatin (MEVACOR) 40 MG tablet, CMP14+EGFR, NMR, lipoprofile  HTN (hypertension) - Plan: benazepril (LOTENSIN) 40 MG tablet, atenolol (TENORMIN) 100 MG tablet, amLODipine (NORVASC) 5 MG tablet, hydrochlorothiazide (HYDRODIURIL) 25 MG tablet, CMP14+EGFR  Obesity, unspecified  Prediabetes - Plan: POCT glycosylated hemoglobin (Hb A1C)  Unspecified hypothyroidism - Plan: levothyroxine (SYNTHROID, LEVOTHROID) 100 MCG tablet, TSH  Unspecified vitamin D deficiency - Plan: ergocalciferol (VITAMIN D2) 50000 UNITS capsule, Vit D  25 hydroxy (rtn osteoporosis monitoring)  Chronic pain syndrome - Plan: HYDROcodone-acetaminophen (NORCO) 7.5-325 MG per tablet  GERD (gastroesophageal reflux disease) - Plan: omeprazole (PRILOSEC) 40 MG capsule  Generalized anxiety disorder - Plan: sertraline (ZOLOFT) 100 MG tablet  Anxiety  Vitamin D deficiency  Arthritis  Chronic pain of multiple joints   PLAN: Orders Placed This Encounter  Procedures  . CMP14+EGFR  . NMR, lipoprofile  . TSH  . Vit D  25 hydroxy (rtn osteoporosis monitoring)  . POCT glycosylated hemoglobin (Hb A1C)    Meds ordered this encounter  Medications  . niacin (SLO-NIACIN) 500 MG tablet    Sig: Take 500 mg by mouth 2 (two) times daily with a meal.  . calcium-vitamin D (OSCAL WITH  D) 500-200 MG-UNIT per tablet    Sig: Take 1 tablet by mouth.  . diphenhydrAMINE (BENADRYL) 25 MG tablet    Sig: Take 25 mg by mouth 2 (two) times daily.  Marland Kitchen DISCONTD: sertraline (ZOLOFT) 100 MG tablet    Sig: Take  100 mg by mouth daily.  . benazepril (LOTENSIN) 40 MG tablet    Sig: Take 1 tablet (40 mg total) by mouth daily.    Dispense:  30 tablet    Refill:  5  . atenolol (TENORMIN) 100 MG tablet    Sig: TAKE ONE TABLET DAILY    Dispense:  30 tablet    Refill:  5  . amLODipine (NORVASC) 5 MG tablet    Sig: Take 1 tablet (5 mg total) by mouth daily.    Dispense:  30 tablet    Refill:  5  . fenofibrate 160 MG tablet    Sig: Take 1 tablet (160 mg total) by mouth daily.    Dispense:  30 tablet    Refill:  5  . lovastatin (MEVACOR) 40 MG tablet    Sig: Take 1 tablet (40 mg total) by mouth at bedtime. Take 2 tablets by mouth at bedtime    Dispense:  30 tablet    Refill:  5  . ergocalciferol (VITAMIN D2) 50000 UNITS capsule    Sig: Take 1 capsule (50,000 Units total) by mouth once a week.    Dispense:  12 capsule    Refill:  0  . HYDROcodone-acetaminophen (NORCO) 7.5-325 MG per tablet    Sig: Take 1 tablet by mouth every 6 (six) hours as needed for pain.    Dispense:  45 tablet    Refill:  0  . omeprazole (PRILOSEC) 40 MG capsule    Sig: TAKE (1) CAPSULE BY MOUTH EACH MORNING.    Dispense:  30 capsule    Refill:  5  . sertraline (ZOLOFT) 100 MG tablet    Sig: TAKE (2) TABLETS BY MOUTH DAILY    Dispense:  60 tablet    Refill:  11  . hydrochlorothiazide (HYDRODIURIL) 25 MG tablet    Sig: Take 1 tablet (25 mg total) by mouth daily.    Dispense:  30 tablet    Refill:  5  . levothyroxine (SYNTHROID, LEVOTHROID) 100 MCG tablet    Sig: Take 1 tablet (100 mcg total) by mouth daily before breakfast.    Dispense:  30 tablet    Refill:  5   Organizing medications and compliance discussed and taking the tinme to take care of herself.       Dr Woodroe Mode Recommendations  For nutrition information, I recommend books:  1).Eat to Live by Dr Monico Hoar. 2).Prevent and Reverse Heart Disease by Dr Suzzette Righter. 3) Dr Katherina Right Book:  Program to Reverse Diabetes  Exercise  recommendations are:  If unable to walk, then the patient can exercise in a chair 3 times a day. By flapping arms like a bird gently and raising legs outwards to the front.  If ambulatory, the patient can go for walks for 30 minutes 3 times a week. Then increase the intensity and duration as tolerated.  Goal is to try to attain exercise frequency to 5 times a week.  If applicable: Best to perform resistance exercises (machines or weights) 2 days a week and cardio type exercises 3 days per week.  Keep active  Return in about 3 months (around 12/18/2013) for Recheck medical problems.  Keerthana Vanrossum P. Modesto Charon,  M.D.

## 2013-09-18 NOTE — Patient Instructions (Signed)
      Dr Mulki Roesler's Recommendations  For nutrition information, I recommend books:  1).Eat to Live by Dr Joel Fuhrman. 2).Prevent and Reverse Heart Disease by Dr Caldwell Esselstyn. 3) Dr Neal Barnard's Book:  Program to Reverse Diabetes  Exercise recommendations are:  If unable to walk, then the patient can exercise in a chair 3 times a day. By flapping arms like a bird gently and raising legs outwards to the front.  If ambulatory, the patient can go for walks for 30 minutes 3 times a week. Then increase the intensity and duration as tolerated.  Goal is to try to attain exercise frequency to 5 times a week.  If applicable: Best to perform resistance exercises (machines or weights) 2 days a week and cardio type exercises 3 days per week.  

## 2013-09-20 LAB — CMP14+EGFR
ALT: 14 IU/L (ref 0–32)
AST: 12 IU/L (ref 0–40)
Albumin/Globulin Ratio: 2 (ref 1.1–2.5)
Albumin: 4.2 g/dL (ref 3.5–4.8)
Alkaline Phosphatase: 49 IU/L (ref 39–117)
BUN/Creatinine Ratio: 15 (ref 11–26)
BUN: 12 mg/dL (ref 8–27)
CO2: 27 mmol/L (ref 18–29)
Calcium: 9.6 mg/dL (ref 8.6–10.2)
Chloride: 99 mmol/L (ref 97–108)
Creatinine, Ser: 0.79 mg/dL (ref 0.57–1.00)
GFR calc Af Amer: 86 mL/min/{1.73_m2} (ref >59)
GFR calc non Af Amer: 75 mL/min/{1.73_m2} (ref >59)
Globulin, Total: 2.1 g/dL (ref 1.5–4.5)
Glucose: 90 mg/dL (ref 65–99)
Potassium: 4.3 mmol/L (ref 3.5–5.2)
Sodium: 141 mmol/L (ref 134–144)
Total Bilirubin: 0.4 mg/dL (ref 0.0–1.2)
Total Protein: 6.3 g/dL (ref 6.0–8.5)

## 2013-09-20 LAB — NMR, LIPOPROFILE
Cholesterol: 137 mg/dL (ref <200)
HDL Cholesterol by NMR: 59 mg/dL (ref >=40)
HDL Particle Number: 38.6 umol/L (ref >=30.5)
LDL Particle Number: 1227 nmol/L — ABNORMAL HIGH (ref <1000)
LDL Size: 19.9 nm — ABNORMAL LOW (ref >20.5)
LDLC SERPL CALC-MCNC: 60 mg/dL (ref <100)
LP-IR Score: 54 — ABNORMAL HIGH (ref <=45)
Small LDL Particle Number: 1081 nmol/L — ABNORMAL HIGH (ref <=527)
Triglycerides by NMR: 88 mg/dL (ref <150)

## 2013-09-20 LAB — TSH: TSH: 1.67 u[IU]/mL (ref 0.450–4.500)

## 2013-09-20 LAB — VITAMIN D 25 HYDROXY (VIT D DEFICIENCY, FRACTURES): Vit D, 25-Hydroxy: 39.7 ng/mL (ref 30.0–100.0)

## 2013-10-15 ENCOUNTER — Ambulatory Visit (INDEPENDENT_AMBULATORY_CARE_PROVIDER_SITE_OTHER): Payer: Commercial Indemnity | Admitting: *Deleted

## 2013-10-15 DIAGNOSIS — Z23 Encounter for immunization: Secondary | ICD-10-CM | POA: Diagnosis not present

## 2013-10-15 NOTE — Progress Notes (Signed)
tdap and fluarix given pt tolerated well

## 2013-10-17 ENCOUNTER — Telehealth: Payer: Self-pay | Admitting: Family Medicine

## 2013-10-17 ENCOUNTER — Other Ambulatory Visit: Payer: Self-pay | Admitting: *Deleted

## 2013-10-17 DIAGNOSIS — G894 Chronic pain syndrome: Secondary | ICD-10-CM

## 2013-10-17 MED ORDER — HYDROCODONE-ACETAMINOPHEN 7.5-325 MG PO TABS: 1.0000 | ORAL_TABLET | Freq: Four times a day (QID) | ORAL | Status: DC | PRN

## 2013-10-17 NOTE — Telephone Encounter (Signed)
rx ready for pickup 

## 2013-10-17 NOTE — Telephone Encounter (Signed)
LAST SEEN AND FILLED BY [facility] ON 09/18/13. WILL PRINT

## 2013-11-12 ENCOUNTER — Other Ambulatory Visit: Payer: Self-pay | Admitting: *Deleted

## 2013-11-12 DIAGNOSIS — G894 Chronic pain syndrome: Secondary | ICD-10-CM

## 2013-11-12 MED ORDER — HYDROCODONE-ACETAMINOPHEN 7.5-325 MG PO TABS: 1.0000 | ORAL_TABLET | Freq: Four times a day (QID) | ORAL | Status: DC | PRN

## 2013-11-12 NOTE — Telephone Encounter (Signed)
LAST OV 09/18/13. LAST RF 10/20/13. PLEASE PRINT AND CALL PT WHEN READY. THANKS.

## 2013-11-12 NOTE — Telephone Encounter (Signed)
rx ready for pickup 

## 2013-11-13 NOTE — Telephone Encounter (Signed)
Up front patient aware 

## 2013-11-19 DIAGNOSIS — M62838 Other muscle spasm: Secondary | ICD-10-CM | POA: Diagnosis not present

## 2013-11-19 DIAGNOSIS — G8929 Other chronic pain: Secondary | ICD-10-CM | POA: Diagnosis not present

## 2013-11-19 DIAGNOSIS — M1710 Unilateral primary osteoarthritis, unspecified knee: Secondary | ICD-10-CM | POA: Diagnosis not present

## 2013-11-19 DIAGNOSIS — Z79899 Other long term (current) drug therapy: Secondary | ICD-10-CM | POA: Diagnosis not present

## 2013-11-19 DIAGNOSIS — M25569 Pain in unspecified knee: Secondary | ICD-10-CM | POA: Diagnosis not present

## 2013-11-27 DIAGNOSIS — IMO0002 Reserved for concepts with insufficient information to code with codable children: Secondary | ICD-10-CM | POA: Diagnosis not present

## 2013-11-27 DIAGNOSIS — M1710 Unilateral primary osteoarthritis, unspecified knee: Secondary | ICD-10-CM | POA: Diagnosis not present

## 2013-11-27 DIAGNOSIS — M25569 Pain in unspecified knee: Secondary | ICD-10-CM | POA: Diagnosis not present

## 2013-12-08 ENCOUNTER — Other Ambulatory Visit: Payer: Self-pay | Admitting: Family Medicine

## 2013-12-10 ENCOUNTER — Other Ambulatory Visit: Payer: Self-pay

## 2013-12-10 DIAGNOSIS — G894 Chronic pain syndrome: Secondary | ICD-10-CM

## 2013-12-10 NOTE — Telephone Encounter (Signed)
Last seen 09/18/13  [facility]  IF approved print and route to nurse

## 2013-12-11 MED ORDER — HYDROCODONE-ACETAMINOPHEN 7.5-325 MG PO TABS: 1.0000 | ORAL_TABLET | Freq: Four times a day (QID) | ORAL | Status: DC | PRN

## 2013-12-11 NOTE — Telephone Encounter (Signed)
Rx ready for pick up. 

## 2013-12-15 NOTE — Telephone Encounter (Signed)
Pt said she already picked up  rx.

## 2013-12-26 ENCOUNTER — Encounter (INDEPENDENT_AMBULATORY_CARE_PROVIDER_SITE_OTHER): Payer: Medicare Other | Admitting: Family Medicine

## 2013-12-27 NOTE — Progress Notes (Signed)
Patient ID: Danielle Parrish, female   DOB: 1941/04/18, 73 y.o.   MRN: [id number] No show

## 2014-01-05 ENCOUNTER — Telehealth: Payer: Self-pay | Admitting: Family Medicine

## 2014-01-06 NOTE — Telephone Encounter (Signed)
Needs to be seen . Not seen since September last year.

## 2014-01-06 NOTE — Telephone Encounter (Signed)
Spoke with pt and advised needs appt  Pt verbalized understanding

## 2014-01-15 ENCOUNTER — Encounter: Payer: Self-pay | Admitting: Family Medicine

## 2014-01-15 ENCOUNTER — Ambulatory Visit (INDEPENDENT_AMBULATORY_CARE_PROVIDER_SITE_OTHER): Payer: Commercial Indemnity | Admitting: Family Medicine

## 2014-01-15 VITALS — BP 138/76 | HR 56 | Temp 98.0°F | Ht 62.0 in | Wt 176.2 lb

## 2014-01-15 DIAGNOSIS — K219 Gastro-esophageal reflux disease without esophagitis: Secondary | ICD-10-CM

## 2014-01-15 DIAGNOSIS — E559 Vitamin D deficiency, unspecified: Secondary | ICD-10-CM | POA: Diagnosis not present

## 2014-01-15 DIAGNOSIS — M2550 Pain in unspecified joint: Secondary | ICD-10-CM

## 2014-01-15 DIAGNOSIS — E785 Hyperlipidemia, unspecified: Secondary | ICD-10-CM

## 2014-01-15 DIAGNOSIS — G894 Chronic pain syndrome: Secondary | ICD-10-CM

## 2014-01-15 DIAGNOSIS — F419 Anxiety disorder, unspecified: Secondary | ICD-10-CM

## 2014-01-15 DIAGNOSIS — M1990 Unspecified osteoarthritis, unspecified site: Secondary | ICD-10-CM

## 2014-01-15 DIAGNOSIS — R7309 Other abnormal glucose: Secondary | ICD-10-CM | POA: Diagnosis not present

## 2014-01-15 DIAGNOSIS — F411 Generalized anxiety disorder: Secondary | ICD-10-CM

## 2014-01-15 DIAGNOSIS — E039 Hypothyroidism, unspecified: Secondary | ICD-10-CM

## 2014-01-15 DIAGNOSIS — I10 Essential (primary) hypertension: Secondary | ICD-10-CM

## 2014-01-15 DIAGNOSIS — M129 Arthropathy, unspecified: Secondary | ICD-10-CM

## 2014-01-15 DIAGNOSIS — E669 Obesity, unspecified: Secondary | ICD-10-CM

## 2014-01-15 DIAGNOSIS — R7303 Prediabetes: Secondary | ICD-10-CM

## 2014-01-15 DIAGNOSIS — G8929 Other chronic pain: Secondary | ICD-10-CM

## 2014-01-15 LAB — POCT GLYCOSYLATED HEMOGLOBIN (HGB A1C): Hemoglobin A1C: 5.8

## 2014-01-15 MED ORDER — OMEPRAZOLE 40 MG PO CPDR: DELAYED_RELEASE_CAPSULE | ORAL | Status: DC

## 2014-01-15 MED ORDER — HYDROCODONE-ACETAMINOPHEN 7.5-325 MG PO TABS: 1.0000 | ORAL_TABLET | Freq: Four times a day (QID) | ORAL | Status: DC | PRN

## 2014-01-15 NOTE — Progress Notes (Signed)
Patient ID: Danielle Parrish, female   DOB: Aug 25, 1941, 73 y.o.   MRN: [id number] SUBJECTIVE: CC: Chief Complaint  Patient presents with  . Follow-up    needs FMLA forms completed states BP  goes up and down and Left knee pain     HPI:  Patient is here for follow up of hyperlipidemia/HTN Arthritis of the knee: denies Headache;denies Chest Pain;denies weakness;denies Shortness of Breath and orthopnea;denies Visual changes;denies palpitations;denies cough;denies pedal edema;denies symptoms of TIA or stroke;deniesClaudication symptoms. admits to Compliance with medications; denies Problems with medications.  Works at [employer]. Treated and had Xrays there. The left knee swollen and had to be treated. Has been off the knee. Needs FMLA form completed to be out of work. Has been out for more than a week. This week makes 2 weeks.   Past Medical History  Diagnosis Date  . Hypertension   . Diabetes mellitus without complication   . Thyroid disease   . Osteoporosis   . Hyperlipidemia   . Vitamin D deficiency   . Anxiety   . GERD (gastroesophageal reflux disease)   . Arthritis   . Chronic pain of multiple joints    Past Surgical History  Procedure Laterality Date  . Abdominal hysterectomy    . Abdominal hernia repair     History   Social History  . Marital Status: Married    Spouse Name: N/A    Number of Children: N/A  . Years of Education: N/A   Occupational History  . Not on file.   Social History Main Topics  . Smoking status: Never Smoker   . Smokeless tobacco: Not on file  . Alcohol Use: Not on file  . Drug Use: Not on file  . Sexual Activity: Not on file   Other Topics Concern  . Not on file   Social History Narrative  . No narrative on file   No family history on file. Current Outpatient Prescriptions on File Prior to Visit  Medication Sig Dispense Refill  . amLODipine (NORVASC) 5 MG tablet TAKE ONE TABLET DAILY  30 tablet  2  . aspirin 81 MG tablet  Take 81 mg by mouth daily.      Marland Kitchen atenolol (TENORMIN) 100 MG tablet TAKE ONE TABLET DAILY  30 tablet  5  . benazepril (LOTENSIN) 40 MG tablet Take 1 tablet (40 mg total) by mouth daily.  30 tablet  5  . calcium-vitamin D (OSCAL WITH D) 500-200 MG-UNIT per tablet Take 1 tablet by mouth.      . diphenhydrAMINE (BENADRYL) 25 MG tablet Take 25 mg by mouth 2 (two) times daily.      . ergocalciferol (VITAMIN D2) 50000 UNITS capsule Take 1 capsule (50,000 Units total) by mouth once a week.  12 capsule  0  . fenofibrate 160 MG tablet Take 1 tablet (160 mg total) by mouth daily.  30 tablet  5  . hydrochlorothiazide (HYDRODIURIL) 25 MG tablet Take 1 tablet (25 mg total) by mouth daily.  30 tablet  5  . levothyroxine (SYNTHROID, LEVOTHROID) 100 MCG tablet Take 1 tablet (100 mcg total) by mouth daily before breakfast.  30 tablet  5  . lovastatin (MEVACOR) 40 MG tablet Take 1 tablet (40 mg total) by mouth at bedtime. Take 2 tablets by mouth at bedtime  30 tablet  5  . Multiple Vitamin (MULTIVITAMIN) tablet Take 1 tablet by mouth daily.      . niacin (SLO-NIACIN) 500 MG tablet Take 500 mg by  mouth 2 (two) times daily with a meal.      . sertraline (ZOLOFT) 100 MG tablet TAKE (2) TABLETS BY MOUTH DAILY  60 tablet  11   No current facility-administered medications on file prior to visit.   Allergies  Allergen Reactions  . Crestor [Rosuvastatin]   . Nsaids   . Penicillins   . Vytorin [Ezetimibe-Simvastatin]    Immunization History  Administered Date(s) Administered  . Influenza,inj,Quad PF,36+ Mos 10/15/2013  . Pneumococcal-Unspecified 12/26/2013  . Tdap 10/15/2013  . Zoster 12/26/2013   Prior to Admission medications   Medication Sig Start Date End Date Taking? Authorizing Provider  amLODipine (NORVASC) 5 MG tablet TAKE ONE TABLET DAILY 12/08/13  Yes Vernie Shanks, MD  aspirin 81 MG tablet Take 81 mg by mouth daily.   Yes Historical Provider, MD  atenolol (TENORMIN) 100 MG tablet TAKE ONE TABLET  DAILY 09/18/13  Yes Vernie Shanks, MD  benazepril (LOTENSIN) 40 MG tablet Take 1 tablet (40 mg total) by mouth daily. 09/18/13  Yes Vernie Shanks, MD  calcium-vitamin D (OSCAL WITH D) 500-200 MG-UNIT per tablet Take 1 tablet by mouth.   Yes Historical Provider, MD  diphenhydrAMINE (BENADRYL) 25 MG tablet Take 25 mg by mouth 2 (two) times daily.   Yes Historical Provider, MD  ergocalciferol (VITAMIN D2) 50000 UNITS capsule Take 1 capsule (50,000 Units total) by mouth once a week. 09/18/13  Yes Vernie Shanks, MD  fenofibrate 160 MG tablet Take 1 tablet (160 mg total) by mouth daily. 09/18/13  Yes Vernie Shanks, MD  hydrochlorothiazide (HYDRODIURIL) 25 MG tablet Take 1 tablet (25 mg total) by mouth daily. 09/18/13  Yes Vernie Shanks, MD  HYDROcodone-acetaminophen (NORCO) 7.5-325 MG per tablet Take 1 tablet by mouth every 6 (six) hours as needed. 12/10/13  Yes Vernie Shanks, MD  levothyroxine (SYNTHROID, LEVOTHROID) 100 MCG tablet Take 1 tablet (100 mcg total) by mouth daily before breakfast. 09/18/13  Yes Vernie Shanks, MD  lovastatin (MEVACOR) 40 MG tablet Take 1 tablet (40 mg total) by mouth at bedtime. Take 2 tablets by mouth at bedtime 09/18/13  Yes Vernie Shanks, MD  Multiple Vitamin (MULTIVITAMIN) tablet Take 1 tablet by mouth daily.   Yes Historical Provider, MD  niacin (SLO-NIACIN) 500 MG tablet Take 500 mg by mouth 2 (two) times daily with a meal.   Yes Historical Provider, MD  omeprazole (PRILOSEC) 40 MG capsule TAKE (1) CAPSULE BY MOUTH EACH MORNING. 09/18/13  Yes Vernie Shanks, MD  sertraline (ZOLOFT) 100 MG tablet TAKE (2) TABLETS BY MOUTH DAILY 09/18/13  Yes Vernie Shanks, MD     ROS: As above in the HPI. All other systems are stable or negative.  OBJECTIVE: APPEARANCE:  Patient in no acute distress.The patient appeared well nourished and normally developed. Acyanotic. Waist: VITAL SIGNS:BP 138/76  Pulse 56  Temp(Src) 98 F (36.7 C) (Oral)  Ht '5\' 2"'  (1.575 m)  Wt 176 lb 3.2  oz (79.924 kg)  BMI 32.22 kg/m2  WF Obese SKIN: warm and  Dry without overt rashes, tattoos and scars  HEAD and Neck: without JVD, Head and scalp: normal Eyes:No scleral icterus. Fundi normal, eye movements normal. Ears: Auricle normal, canal normal, Tympanic membranes normal, insufflation normal. Nose: normal Throat: normal Neck & thyroid: normal  CHEST & LUNGS: Chest wall: normal Lungs: Clear  CVS: Reveals the PMI to be normally located. Regular rhythm, First and Second Heart sounds are normal,  absence of murmurs,  rubs or gallops. Peripheral vasculature: Radial pulses: normal Dorsal pedis pulses: normal Posterior pulses: normal  ABDOMEN:  Appearance: normal Benign, no organomegaly, no masses, no Abdominal Aortic enlargement. No Guarding , no rebound. No Bruits. Bowel sounds: normal  RECTAL: N/A GU: N/A  EXTREMETIES: nonedematous.  MUSCULOSKELETAL:  Spine: limited ROM Joints: crepitus knees. No effusion today , but patient limps and it is painful ROM.  NEUROLOGIC: oriented to time,place and person; nonfocal.  ASSESSMENT:  Prediabetes - Plan: POCT glycosylated hemoglobin (Hb A1C)  Obesity, unspecified  HTN (hypertension) - Plan: CMP14+EGFR  HLD (hyperlipidemia) - Plan: CMP14+EGFR, Lipid panel  Vitamin D deficiency  Unspecified hypothyroidism - Plan: TSH  GERD (gastroesophageal reflux disease) - Plan: omeprazole (PRILOSEC) 40 MG capsule  Anxiety  Arthritis - Plan: Ambulatory referral to Orthopedic Surgery  Chronic pain of multiple joints  Chronic pain syndrome - Plan: HYDROcodone-acetaminophen (NORCO) 7.5-325 MG per tablet especially of the knees. Left worse than right with advanced arthritis. She will need to get copies of her recent Xrays from [facility] where she works for the orthopedic consultation.   PLAN:       Dr Paula Libra Recommendations  For nutrition information, I recommend books:  1).Eat to Live by Dr Excell Seltzer. 2).Prevent and Reverse Heart Disease by Dr Karl Luke. 3) Dr Janene Harvey Book:  Program to Reverse Diabetes  Exercise recommendations are:  If unable to walk, then the patient can exercise in a chair 3 times a day. By flapping arms like a bird gently and raising legs outwards to the front.  If ambulatory, the patient can go for walks for 30 minutes 3 times a week. Then increase the intensity and duration as tolerated.  Goal is to try to attain exercise frequency to 5 times a week.  If applicable: Best to perform resistance exercises (machines or weights) 2 days a week and cardio type exercises 3 days per week.   Recommend using the voltaren cream four times a day.  Orders Placed This Encounter  Procedures  . CMP14+EGFR  . Lipid panel  . TSH  . Ambulatory referral to Orthopedic Surgery    Referral Priority:  Routine    Referral Type:  Surgical    Referral Reason:  Specialty Services Required    Requested Specialty:  Orthopedic Surgery    Number of Visits Requested:  1  . POCT glycosylated hemoglobin (Hb A1C)   Meds ordered this encounter  Medications  . diclofenac sodium (VOLTAREN) 1 % GEL    Sig: Apply 2 g topically 4 (four) times daily.  Marland Kitchen HYDROcodone-acetaminophen (NORCO) 7.5-325 MG per tablet    Sig: Take 1 tablet by mouth every 6 (six) hours as needed.    Dispense:  45 tablet    Refill:  0  . omeprazole (PRILOSEC) 40 MG capsule    Sig: TAKE (1) CAPSULE BY MOUTH EACH MORNING.    Dispense:  30 capsule    Refill:  5   Medications Discontinued During This Encounter  Medication Reason  . HYDROcodone-acetaminophen (NORCO) 7.5-325 MG per tablet Reorder  . omeprazole (PRILOSEC) 40 MG capsule Reorder   Return in about 3 months (around 04/15/2014) for Recheck medical problems.  [employer]esan [employer] P. Jacelyn Grip, M.D.

## 2014-01-15 NOTE — Patient Instructions (Signed)
      Dr Anthonio Mizzell's Recommendations  For nutrition information, I recommend books:  1).Eat to Live by Dr Joel Fuhrman. 2).Prevent and Reverse Heart Disease by Dr Caldwell Esselstyn. 3) Dr Neal Barnard's Book:  Program to Reverse Diabetes  Exercise recommendations are:  If unable to walk, then the patient can exercise in a chair 3 times a day. By flapping arms like a bird gently and raising legs outwards to the front.  If ambulatory, the patient can go for walks for 30 minutes 3 times a week. Then increase the intensity and duration as tolerated.  Goal is to try to attain exercise frequency to 5 times a week.  If applicable: Best to perform resistance exercises (machines or weights) 2 days a week and cardio type exercises 3 days per week.  

## 2014-01-16 LAB — LIPID PANEL
Chol/HDL Ratio: 2.4 ratio units (ref 0.0–4.4)
Cholesterol, Total: 151 mg/dL (ref 100–199)
HDL: 64 mg/dL (ref >39)
LDL Calculated: 68 mg/dL (ref 0–99)
Triglycerides: 96 mg/dL (ref 0–149)
VLDL Cholesterol Cal: 19 mg/dL (ref 5–40)

## 2014-01-16 LAB — CMP14+EGFR
ALT: 21 IU/L (ref 0–32)
AST: 22 IU/L (ref 0–40)
Albumin/Globulin Ratio: 2.1 (ref 1.1–2.5)
Albumin: 4.5 g/dL (ref 3.5–4.8)
Alkaline Phosphatase: 47 IU/L (ref 39–117)
BUN/Creatinine Ratio: 24 (ref 11–26)
BUN: 24 mg/dL (ref 8–27)
CO2: 25 mmol/L (ref 18–29)
Calcium: 9.8 mg/dL (ref 8.7–10.3)
Chloride: 97 mmol/L (ref 97–108)
Creatinine, Ser: 1.02 mg/dL — ABNORMAL HIGH (ref 0.57–1.00)
GFR calc Af Amer: 63 mL/min/{1.73_m2} (ref >59)
GFR calc non Af Amer: 55 mL/min/{1.73_m2} — ABNORMAL LOW (ref >59)
Globulin, Total: 2.1 g/dL (ref 1.5–4.5)
Glucose: 100 mg/dL — ABNORMAL HIGH (ref 65–99)
Potassium: 4.7 mmol/L (ref 3.5–5.2)
Sodium: 139 mmol/L (ref 134–144)
Total Bilirubin: 0.3 mg/dL (ref 0.0–1.2)
Total Protein: 6.6 g/dL (ref 6.0–8.5)

## 2014-01-16 LAB — TSH: TSH: 1.49 u[IU]/mL (ref 0.450–4.500)

## 2014-01-21 DIAGNOSIS — IMO0002 Reserved for concepts with insufficient information to code with codable children: Secondary | ICD-10-CM | POA: Diagnosis not present

## 2014-01-21 DIAGNOSIS — M1710 Unilateral primary osteoarthritis, unspecified knee: Secondary | ICD-10-CM | POA: Diagnosis not present

## 2014-01-23 ENCOUNTER — Other Ambulatory Visit: Payer: Self-pay | Admitting: *Deleted

## 2014-01-23 DIAGNOSIS — E559 Vitamin D deficiency, unspecified: Secondary | ICD-10-CM

## 2014-01-23 MED ORDER — ERGOCALCIFEROL 1.25 MG (50000 UT) PO CAPS: 50000.0000 [IU] | ORAL_CAPSULE | ORAL | Status: DC

## 2014-01-23 NOTE — Telephone Encounter (Signed)
Call patient : Prescription refilled & sent to pharmacy in [place]. 

## 2014-01-23 NOTE — Telephone Encounter (Signed)
Do you want patient to continue on rx vit d? Please advise

## 2014-01-27 ENCOUNTER — Other Ambulatory Visit: Payer: Self-pay | Admitting: Family Medicine

## 2014-02-02 ENCOUNTER — Telehealth: Payer: Self-pay | Admitting: Family Medicine

## 2014-02-02 NOTE — Telephone Encounter (Signed)
Pt notiifed and dates are from jan 11 for 4 weeks

## 2014-02-03 ENCOUNTER — Telehealth: Payer: Self-pay | Admitting: Family Medicine

## 2014-02-05 NOTE — Telephone Encounter (Signed)
Spoke to pt and she states she is ready to return to work  Needs note saying can return to work without restrictions  Will pick up 02-06-14 after 2 pm

## 2014-02-06 ENCOUNTER — Other Ambulatory Visit: Payer: Self-pay | Admitting: Family Medicine

## 2014-02-06 NOTE — Telephone Encounter (Signed)
Letter done

## 2014-02-18 ENCOUNTER — Telehealth: Payer: Self-pay | Admitting: Family Medicine

## 2014-02-20 ENCOUNTER — Other Ambulatory Visit: Payer: Self-pay | Admitting: Family Medicine

## 2014-02-20 DIAGNOSIS — G894 Chronic pain syndrome: Secondary | ICD-10-CM

## 2014-02-20 MED ORDER — HYDROCODONE-ACETAMINOPHEN 7.5-325 MG PO TABS: 1.0000 | ORAL_TABLET | Freq: Four times a day (QID) | ORAL | Status: DC | PRN

## 2014-02-20 NOTE — Telephone Encounter (Signed)
Aware,rx ready. 

## 2014-02-20 NOTE — Telephone Encounter (Signed)
Rx ready for pick up. 

## 2014-03-18 ENCOUNTER — Telehealth: Payer: Self-pay | Admitting: Family Medicine

## 2014-03-20 ENCOUNTER — Other Ambulatory Visit: Payer: Self-pay | Admitting: *Deleted

## 2014-03-20 DIAGNOSIS — E559 Vitamin D deficiency, unspecified: Secondary | ICD-10-CM

## 2014-03-20 MED ORDER — ERGOCALCIFEROL 1.25 MG (50000 UT) PO CAPS: 50000.0000 [IU] | ORAL_CAPSULE | ORAL | Status: DC

## 2014-03-23 ENCOUNTER — Other Ambulatory Visit: Payer: Self-pay | Admitting: Family Medicine

## 2014-03-23 DIAGNOSIS — G894 Chronic pain syndrome: Secondary | ICD-10-CM

## 2014-03-23 MED ORDER — HYDROCODONE-ACETAMINOPHEN 7.5-325 MG PO TABS
1.0000 | ORAL_TABLET | Freq: Four times a day (QID) | ORAL | Status: DC | PRN
Start: 2014-03-23 — End: 2014-04-23

## 2014-03-23 NOTE — Telephone Encounter (Signed)
Pt notified to pick up rx at front desk for her hydrocodone

## 2014-03-23 NOTE — Telephone Encounter (Signed)
Rx ready for pick up. 

## 2014-04-03 ENCOUNTER — Other Ambulatory Visit: Payer: Self-pay

## 2014-04-03 DIAGNOSIS — Z1231 Encounter for screening mammogram for malignant neoplasm of breast: Secondary | ICD-10-CM

## 2014-04-08 ENCOUNTER — Other Ambulatory Visit: Payer: Self-pay | Admitting: *Deleted

## 2014-04-08 DIAGNOSIS — E039 Hypothyroidism, unspecified: Secondary | ICD-10-CM

## 2014-04-08 DIAGNOSIS — K219 Gastro-esophageal reflux disease without esophagitis: Secondary | ICD-10-CM

## 2014-04-08 DIAGNOSIS — I10 Essential (primary) hypertension: Secondary | ICD-10-CM

## 2014-04-08 DIAGNOSIS — E785 Hyperlipidemia, unspecified: Secondary | ICD-10-CM

## 2014-04-08 DIAGNOSIS — F411 Generalized anxiety disorder: Secondary | ICD-10-CM

## 2014-04-08 MED ORDER — BENAZEPRIL HCL 40 MG PO TABS
40.0000 mg | ORAL_TABLET | Freq: Every day | ORAL | Status: DC
Start: 2014-04-08 — End: 2014-06-30

## 2014-04-08 MED ORDER — LEVOTHYROXINE SODIUM 100 MCG PO TABS: 100.0000 ug | ORAL_TABLET | Freq: Every day | ORAL | Status: DC

## 2014-04-08 MED ORDER — LOVASTATIN 40 MG PO TABS: 40.0000 mg | ORAL_TABLET | Freq: Every day | ORAL | Status: DC

## 2014-04-08 MED ORDER — FENOFIBRATE 160 MG PO TABS: 160.0000 mg | ORAL_TABLET | Freq: Every day | ORAL | Status: DC

## 2014-04-08 MED ORDER — SERTRALINE HCL 100 MG PO TABS
ORAL_TABLET | ORAL | Status: DC
Start: 2014-04-08 — End: 2014-06-30

## 2014-04-08 MED ORDER — HYDROCHLOROTHIAZIDE 25 MG PO TABS: 25.0000 mg | ORAL_TABLET | Freq: Every day | ORAL | Status: DC

## 2014-04-08 MED ORDER — OMEPRAZOLE 40 MG PO CPDR: DELAYED_RELEASE_CAPSULE | ORAL | Status: DC

## 2014-04-08 MED ORDER — AMLODIPINE BESYLATE 5 MG PO TABS: 5.0000 mg | ORAL_TABLET | Freq: Every day | ORAL | Status: DC

## 2014-04-08 MED ORDER — ATENOLOL 100 MG PO TABS: ORAL_TABLET | ORAL | Status: DC

## 2014-04-22 ENCOUNTER — Telehealth: Payer: Self-pay | Admitting: Family Medicine

## 2014-04-22 DIAGNOSIS — G894 Chronic pain syndrome: Secondary | ICD-10-CM

## 2014-04-23 ENCOUNTER — Other Ambulatory Visit: Payer: Self-pay | Admitting: Family Medicine

## 2014-04-23 DIAGNOSIS — G894 Chronic pain syndrome: Secondary | ICD-10-CM

## 2014-04-23 MED ORDER — HYDROCODONE-ACETAMINOPHEN 7.5-325 MG PO TABS: 1.0000 | ORAL_TABLET | Freq: Four times a day (QID) | ORAL | Status: DC | PRN

## 2014-04-23 NOTE — Telephone Encounter (Signed)
Rx ready for pick up. 

## 2014-04-23 NOTE — Telephone Encounter (Signed)
Patient aware.

## 2014-05-12 ENCOUNTER — Encounter: Payer: Self-pay | Admitting: Physician Assistant

## 2014-05-12 ENCOUNTER — Ambulatory Visit (INDEPENDENT_AMBULATORY_CARE_PROVIDER_SITE_OTHER): Payer: Medicare Other | Admitting: Physician Assistant

## 2014-05-12 VITALS — BP 126/70 | HR 81 | Temp 99.2°F | Ht 62.0 in | Wt 189.0 lb

## 2014-05-12 DIAGNOSIS — M25569 Pain in unspecified knee: Secondary | ICD-10-CM | POA: Diagnosis not present

## 2014-05-12 DIAGNOSIS — M12569 Traumatic arthropathy, unspecified knee: Secondary | ICD-10-CM

## 2014-05-12 DIAGNOSIS — M25562 Pain in left knee: Secondary | ICD-10-CM

## 2014-05-12 NOTE — Progress Notes (Signed)
Subjective:     Patient ID: Danielle Parrish, female   DOB: 1941-08-06, 73 y.o.   MRN: [id number]  HPI Pt with a hx of degen arthritis to the L knee Pt prev seen by Ortho and had injection to the knee ~ 3 months ago She has had return of sx of pain to the medial knee  Review of Systems + pain and swelling to the L knee Sx worse with activity Denies locking or giving way of the knee    Objective:   Physical Exam + edema/effusion to the L knee FROM f the knee but there is patellar crepitus No apprec laxity Strength is good distal + TTP along the medial joint line Offered injection Consent obtained 1.5cc Mar/1.5 cc kenalog under sterile technique to the medial joint space Dressing place    Assessment:     Knee pain    Plan:     Heat/Ice Activity as tol Informed we want to get at least 3 months between injections F/U prn

## 2014-05-12 NOTE — Patient Instructions (Signed)
Knee Pain Knee pain can be a result of an injury or other medical conditions. Treatment will depend on the cause of your pain. HOME CARE  Only take medicine as told by your doctor.  Keep a healthy weight. Being overweight can make the knee hurt more.  Stretch before exercising or playing sports.  If there is constant knee pain, change the way you exercise. Ask your doctor for advice.  Make sure shoes fit well. Choose the right shoe for the sport or activity.  Protect your knees. Wear kneepads if needed.  Rest when you are tired. GET HELP RIGHT AWAY IF:   Your knee pain does not stop.  Your knee pain does not get better.  Your knee joint feels hot to the touch.  You have a fever. MAKE SURE YOU:   Understand these instructions.  Will watch this condition.  Will get help right away if you are not doing well or get worse. Document Released: 03/09/2009 Document Revised: 03/04/2012 Document Reviewed: 03/09/2009 [employer] Patient Information 2014 [employer].  

## 2014-05-15 ENCOUNTER — Telehealth: Payer: Self-pay | Admitting: Family Medicine

## 2014-05-15 NOTE — Telephone Encounter (Signed)
Patient is still complaining with a lot of pain in her knee. She states she is using her pain medication and it is not helping at all what do you advise is her next step

## 2014-05-15 NOTE — Telephone Encounter (Signed)
Can refer back to Ortho Continue with meds

## 2014-05-19 NOTE — Telephone Encounter (Signed)
Patient will call ortho.

## 2014-05-28 ENCOUNTER — Telehealth: Payer: Self-pay | Admitting: Family Medicine

## 2014-05-28 DIAGNOSIS — G894 Chronic pain syndrome: Secondary | ICD-10-CM

## 2014-05-28 MED ORDER — HYDROCODONE-ACETAMINOPHEN 7.5-325 MG PO TABS: 1.0000 | ORAL_TABLET | Freq: Four times a day (QID) | ORAL | Status: DC | PRN

## 2014-05-28 NOTE — Telephone Encounter (Signed)
Pain medication refilled

## 2014-05-29 ENCOUNTER — Other Ambulatory Visit: Payer: Self-pay | Admitting: Family

## 2014-05-29 DIAGNOSIS — G894 Chronic pain syndrome: Secondary | ICD-10-CM

## 2014-05-29 MED ORDER — HYDROCODONE-ACETAMINOPHEN 7.5-325 MG PO TABS: 1.0000 | ORAL_TABLET | Freq: Four times a day (QID) | ORAL | Status: DC | PRN

## 2014-05-29 NOTE — Progress Notes (Signed)
Patient aware.

## 2014-05-29 NOTE — Telephone Encounter (Signed)
Patient aware.

## 2014-05-30 DIAGNOSIS — M1710 Unilateral primary osteoarthritis, unspecified knee: Secondary | ICD-10-CM | POA: Diagnosis not present

## 2014-06-05 ENCOUNTER — Ambulatory Visit
Admission: RE | Admit: 2014-06-05 | Discharge: 2014-06-05 | Disposition: A | Discharge status: Home / Self Care | Payer: Medicare Other | Source: Ambulatory Visit

## 2014-06-05 DIAGNOSIS — Z1231 Encounter for screening mammogram for malignant neoplasm of breast: Secondary | ICD-10-CM

## 2014-06-08 DIAGNOSIS — M1710 Unilateral primary osteoarthritis, unspecified knee: Secondary | ICD-10-CM | POA: Diagnosis not present

## 2014-06-16 DIAGNOSIS — M23305 Other meniscus derangements, unspecified medial meniscus, unspecified knee: Secondary | ICD-10-CM | POA: Diagnosis not present

## 2014-06-19 ENCOUNTER — Telehealth: Payer: Self-pay | Admitting: Family

## 2014-06-19 NOTE — Telephone Encounter (Signed)
Pt has decided to to do surgical clearance on 06/30/14.  Disregard message for appt

## 2014-06-30 ENCOUNTER — Ambulatory Visit (INDEPENDENT_AMBULATORY_CARE_PROVIDER_SITE_OTHER): Payer: Medicare Other | Admitting: Family

## 2014-06-30 ENCOUNTER — Encounter: Payer: Self-pay | Admitting: Family

## 2014-06-30 ENCOUNTER — Ambulatory Visit (INDEPENDENT_AMBULATORY_CARE_PROVIDER_SITE_OTHER): Payer: Medicare Other

## 2014-06-30 VITALS — BP 130/64 | HR 54 | Temp 98.7°F | Ht 62.0 in | Wt 185.0 lb

## 2014-06-30 DIAGNOSIS — Z419 Encounter for procedure for purposes other than remedying health state, unspecified: Secondary | ICD-10-CM

## 2014-06-30 DIAGNOSIS — E538 Deficiency of other specified B group vitamins: Secondary | ICD-10-CM | POA: Diagnosis not present

## 2014-06-30 DIAGNOSIS — E785 Hyperlipidemia, unspecified: Secondary | ICD-10-CM | POA: Diagnosis not present

## 2014-06-30 DIAGNOSIS — I10 Essential (primary) hypertension: Secondary | ICD-10-CM

## 2014-06-30 DIAGNOSIS — Z79899 Other long term (current) drug therapy: Secondary | ICD-10-CM | POA: Diagnosis not present

## 2014-06-30 DIAGNOSIS — K219 Gastro-esophageal reflux disease without esophagitis: Secondary | ICD-10-CM | POA: Diagnosis not present

## 2014-06-30 DIAGNOSIS — G894 Chronic pain syndrome: Secondary | ICD-10-CM

## 2014-06-30 DIAGNOSIS — F411 Generalized anxiety disorder: Secondary | ICD-10-CM

## 2014-06-30 DIAGNOSIS — E559 Vitamin D deficiency, unspecified: Secondary | ICD-10-CM | POA: Diagnosis not present

## 2014-06-30 DIAGNOSIS — R7989 Other specified abnormal findings of blood chemistry: Secondary | ICD-10-CM | POA: Diagnosis not present

## 2014-06-30 DIAGNOSIS — E039 Hypothyroidism, unspecified: Secondary | ICD-10-CM

## 2014-06-30 LAB — POCT CBC
Granulocyte percent: 53.5 %G (ref 37–80)
HCT, POC: 36.6 % — AB (ref 37.7–47.9)
Hemoglobin: 11.7 g/dL — AB (ref 12.2–16.2)
Lymph, poc: 1.8 (ref 0.6–3.4)
MCH, POC: 28.1 pg (ref 27–31.2)
MCHC: 32.1 g/dL (ref 31.8–35.4)
MCV: 87.7 fL (ref 80–97)
MPV: 7 fL (ref 0–99.8)
POC Granulocyte: 2.5 (ref 2–6.9)
POC LYMPH PERCENT: 37.8 %L (ref 10–50)
Platelet Count, POC: 238 10*3/uL (ref 142–424)
RBC: 4.2 M/uL (ref 4.04–5.48)
RDW, POC: 15.3 %
WBC: 4.7 10*3/uL (ref 4.6–10.2)

## 2014-06-30 MED ORDER — BENAZEPRIL HCL 40 MG PO TABS: 40.0000 mg | ORAL_TABLET | Freq: Every day | ORAL | Status: DC

## 2014-06-30 MED ORDER — SERTRALINE HCL 100 MG PO TABS: ORAL_TABLET | ORAL | Status: DC

## 2014-06-30 MED ORDER — HYDROCODONE-ACETAMINOPHEN 7.5-325 MG PO TABS: 1.0000 | ORAL_TABLET | Freq: Four times a day (QID) | ORAL | Status: DC | PRN

## 2014-06-30 MED ORDER — OMEPRAZOLE 40 MG PO CPDR: DELAYED_RELEASE_CAPSULE | ORAL | Status: DC

## 2014-06-30 MED ORDER — ATENOLOL 100 MG PO TABS: ORAL_TABLET | ORAL | Status: DC

## 2014-06-30 MED ORDER — HYDROCHLOROTHIAZIDE 25 MG PO TABS: 25.0000 mg | ORAL_TABLET | Freq: Every day | ORAL | Status: DC

## 2014-06-30 MED ORDER — AMLODIPINE BESYLATE 5 MG PO TABS: 5.0000 mg | ORAL_TABLET | Freq: Every day | ORAL | Status: DC

## 2014-06-30 MED ORDER — LOVASTATIN 40 MG PO TABS: 40.0000 mg | ORAL_TABLET | Freq: Every day | ORAL | Status: DC

## 2014-06-30 MED ORDER — FENOFIBRATE 160 MG PO TABS: 160.0000 mg | ORAL_TABLET | Freq: Every day | ORAL | Status: DC

## 2014-06-30 NOTE — Patient Instructions (Signed)

## 2014-06-30 NOTE — Progress Notes (Signed)
 Subjective:    Patient ID: Danielle Parrish, female    DOB: 05/15/1941, 72 y.o.   MRN: [id number]  Pt presents to the office for surgical clearance for left knee surgery. Pt has the following chronic conditions:  Hypertension This is a chronic problem. The current episode started more than 1 year ago. The problem has been resolved since onset. The problem is controlled. Associated symptoms include anxiety. Pertinent negatives include no blurred vision, chest pain, headaches, palpitations, peripheral edema or shortness of breath. Risk factors for coronary artery disease include dyslipidemia, obesity and post-menopausal state. Past treatments include ACE inhibitors, calcium channel blockers, beta blockers and diuretics. The current treatment provides significant improvement. Hypertensive end-organ damage includes a thyroid problem. There is no history of kidney disease, CAD/MI or heart failure. Identifiable causes of hypertension include sleep apnea.  Hyperlipidemia This is a chronic problem. The current episode started more than 1 year ago. The problem is controlled. Recent lipid tests were reviewed and are normal. Exacerbating diseases include hypothyroidism. She has no history of diabetes. Pertinent negatives include no chest pain, focal sensory loss, leg pain, myalgias or shortness of breath. Current antihyperlipidemic treatment includes statins and fibric acid derivatives. The current treatment provides significant improvement of lipids. Risk factors for coronary artery disease include dyslipidemia, hypertension, obesity and post-menopausal.  Thyroid Problem Presents for follow-up visit. Symptoms include anxiety. Patient reports no depressed mood, diarrhea, dry skin, fatigue, hair loss, heat intolerance, leg swelling or palpitations. The symptoms have been stable. Past treatments include levothyroxine. The treatment provided significant relief. Her past medical history is significant for  hyperlipidemia. There is no history of diabetes or heart failure.  Anxiety Presents for follow-up visit. Patient reports no chest pain, depressed mood, excessive worry, insomnia, irritability, nausea, nervous/anxious behavior, palpitations, panic, restlessness or shortness of breath. Symptoms occur rarely. The severity of symptoms is mild. The quality of sleep is good.   Her past medical history is significant for anxiety/panic attacks. There is no history of depression. Past treatments include SSRIs.  Gastrophageal Reflux She reports no chest pain, no choking, no coughing, no heartburn, no nausea, no sore throat, no tooth decay or no wheezing. This is a chronic problem. The current episode started more than 1 year ago. The problem occurs rarely. The problem has been resolved. The symptoms are aggravated by certain foods. Pertinent negatives include no fatigue. She has tried a PPI for the symptoms. The treatment provided significant relief.  *Pt also has sleep apnea which she uses a CPAP machine every night for.   Review of Systems  Constitutional: Negative.  Negative for irritability and fatigue.  HENT: Negative.  Negative for sore throat.   Eyes: Negative.  Negative for blurred vision.  Respiratory: Negative.  Negative for cough, choking, shortness of breath and wheezing.   Cardiovascular: Negative.  Negative for chest pain and palpitations.  Gastrointestinal: Negative.  Negative for heartburn, nausea and diarrhea.  Endocrine: Negative.  Negative for heat intolerance.  Genitourinary: Negative.   Musculoskeletal: Negative.  Negative for myalgias.  Neurological: Negative.  Negative for headaches.  Hematological: Negative.   Psychiatric/Behavioral: Negative.  The patient is not nervous/anxious and does not have insomnia.   All other systems reviewed and are negative.      Objective:   Physical Exam  Vitals reviewed. Constitutional: She is oriented to person, place, and time. She appears  well-developed and well-nourished. No distress.  HENT:  Head: Normocephalic and atraumatic.  Right Ear: External ear normal.  Mouth/Throat: Oropharynx   is clear and moist.  Eyes: Pupils are equal, round, and reactive to light.  Neck: Normal range of motion. Neck supple. No thyromegaly present.  Cardiovascular: Normal rate, regular rhythm, normal heart sounds and intact distal pulses.   No murmur heard. Pulmonary/Chest: Effort normal and breath sounds normal. No respiratory distress. She has no wheezes.  Abdominal: Soft. Bowel sounds are normal. She exhibits no distension. There is no tenderness.  Musculoskeletal: Normal range of motion. She exhibits tenderness. She exhibits no edema.  Slow movements of left knee r/t to pain   Neurological: She is alert and oriented to person, place, and time. She has normal reflexes. No cranial nerve deficit.  Skin: Skin is warm and dry.  Psychiatric: She has a normal mood and affect. Her behavior is normal. Judgment and thought content normal.   EKG-WNL Chest X-ray- WNL Preliminary reading by Christy Hawks, FNP [facility]    BP 130/64  Pulse 54  Temp(Src) 98.7 F (37.1 C) (Oral)  Ht 5' 2" (1.575 m)  Wt 185 lb (83.915 kg)  BMI 33.83 kg/m2  SpO2 93%     Assessment & Plan:  1. Essential hypertension - DG Chest 2 View; Future - EKG 12-Lead - CMP14+EGFR - amLODipine (NORVASC) 5 MG tablet; Take 1 tablet (5 mg total) by mouth daily.  Dispense: 90 tablet; Refill: 2 - hydrochlorothiazide (HYDRODIURIL) 25 MG tablet; Take 1 tablet (25 mg total) by mouth daily.  Dispense: 90 tablet; Refill: 3 - benazepril (LOTENSIN) 40 MG tablet; Take 1 tablet (40 mg total) by mouth daily.  Dispense: 90 tablet; Refill: 3 - atenolol (TENORMIN) 100 MG tablet; TAKE ONE TABLET DAILY  Dispense: 90 tablet; Refill: 3  2. Gastroesophageal reflux disease without esophagitis - omeprazole (PRILOSEC) 40 MG capsule; TAKE (1) CAPSULE BY MOUTH EACH MORNING.  Dispense: 90 capsule; Refill:  3  3. Unspecified hypothyroidism - Thyroid Panel With TSH  4. HLD (hyperlipidemia) - Lipid panel - lovastatin (MEVACOR) 40 MG tablet; Take 1 tablet (40 mg total) by mouth at bedtime.  Dispense: 90 tablet; Refill: 3 - fenofibrate 160 MG tablet; Take 1 tablet (160 mg total) by mouth daily.  Dispense: 90 tablet; Refill: 0  5. Vitamin D deficiency - Vit D  25 hydroxy (rtn osteoporosis monitoring)  6. Generalized anxiety disorder - sertraline (ZOLOFT) 100 MG tablet; TAKE (2) TABLETS BY MOUTH DAILY  Dispense: 180 tablet; Refill: 3  7. Surgery, elective - POCT CBC  8. Chronic pain syndrome  - HYDROcodone-acetaminophen (NORCO) 7.5-325 MG per tablet; Take 1 tablet by mouth every 6 (six) hours as needed.  Dispense: 45 tablet; Refill: 0   Continue all meds Labs pending Health Maintenance reviewed Diet and exercise encouraged RTO 3 months  Christy Hawks, FNP   

## 2014-07-01 ENCOUNTER — Other Ambulatory Visit: Payer: Self-pay | Admitting: Family

## 2014-07-01 DIAGNOSIS — I517 Cardiomegaly: Secondary | ICD-10-CM

## 2014-07-01 LAB — LIPID PANEL
Chol/HDL Ratio: 2.8 ratio units (ref 0.0–4.4)
Cholesterol, Total: 145 mg/dL (ref 100–199)
HDL: 52 mg/dL (ref >39)
LDL Calculated: 71 mg/dL (ref 0–99)
Triglycerides: 111 mg/dL (ref 0–149)
VLDL Cholesterol Cal: 22 mg/dL (ref 5–40)

## 2014-07-01 LAB — CMP14+EGFR
ALT: 17 IU/L (ref 0–32)
AST: 21 IU/L (ref 0–40)
Albumin/Globulin Ratio: 1.8 (ref 1.1–2.5)
Albumin: 4.2 g/dL (ref 3.5–4.8)
Alkaline Phosphatase: 50 IU/L (ref 39–117)
BUN/Creatinine Ratio: 18 (ref 11–26)
BUN: 17 mg/dL (ref 8–27)
CO2: 27 mmol/L (ref 18–29)
Calcium: 9.9 mg/dL (ref 8.7–10.3)
Chloride: 96 mmol/L — ABNORMAL LOW (ref 97–108)
Creatinine, Ser: 0.93 mg/dL (ref 0.57–1.00)
GFR calc Af Amer: 71 mL/min/{1.73_m2} (ref >59)
GFR calc non Af Amer: 61 mL/min/{1.73_m2} (ref >59)
Globulin, Total: 2.3 g/dL (ref 1.5–4.5)
Glucose: 95 mg/dL (ref 65–99)
Potassium: 4.4 mmol/L (ref 3.5–5.2)
Sodium: 137 mmol/L (ref 134–144)
Total Bilirubin: 0.3 mg/dL (ref 0.0–1.2)
Total Protein: 6.5 g/dL (ref 6.0–8.5)

## 2014-07-01 LAB — THYROID PANEL WITH TSH
Free Thyroxine Index: 3.3 (ref 1.2–4.9)
T3 Uptake Ratio: 33 % (ref 24–39)
T4, Total: 10.1 ug/dL (ref 4.5–12.0)
TSH: 1.51 u[IU]/mL (ref 0.450–4.500)

## 2014-07-01 LAB — VITAMIN D 25 HYDROXY (VIT D DEFICIENCY, FRACTURES): Vit D, 25-Hydroxy: 26.4 ng/mL — ABNORMAL LOW (ref 30.0–100.0)

## 2014-07-02 LAB — ANEMIA PROFILE B
Ferritin: 16 ng/mL (ref 15–150)
Folate: 17.1 ng/mL (ref >3.0)
Iron Saturation: 13 % — ABNORMAL LOW (ref 15–55)
Iron: 56 ug/dL (ref 35–155)
TIBC: 425 ug/dL (ref 250–450)
UIBC: 369 ug/dL (ref 150–375)
Vitamin B-12: 272 pg/mL (ref 211–946)

## 2014-07-02 LAB — SPECIMEN STATUS REPORT

## 2014-07-03 ENCOUNTER — Telehealth: Payer: Self-pay | Admitting: Family

## 2014-07-03 NOTE — Telephone Encounter (Signed)
Pt given appointment with instructions to call for cancellations

## 2014-07-06 ENCOUNTER — Other Ambulatory Visit: Payer: Self-pay | Admitting: Family

## 2014-07-06 MED ORDER — FERROUS SULFATE 90 (18 FE) MG PO TABS: 90.0000 mg | ORAL_TABLET | Freq: Two times a day (BID) | ORAL | Status: DC

## 2014-07-20 ENCOUNTER — Telehealth: Payer: Self-pay | Admitting: Family Medicine

## 2014-07-20 DIAGNOSIS — E039 Hypothyroidism, unspecified: Secondary | ICD-10-CM

## 2014-07-21 ENCOUNTER — Telehealth: Payer: Self-pay | Admitting: *Deleted

## 2014-07-21 DIAGNOSIS — E039 Hypothyroidism, unspecified: Secondary | ICD-10-CM

## 2014-07-21 MED ORDER — LEVOTHYROXINE SODIUM 100 MCG PO TABS: 100.0000 ug | ORAL_TABLET | Freq: Every day | ORAL | Status: DC

## 2014-07-21 NOTE — Telephone Encounter (Signed)
done

## 2014-08-06 ENCOUNTER — Ambulatory Visit (INDEPENDENT_AMBULATORY_CARE_PROVIDER_SITE_OTHER): Payer: Medicare Other | Admitting: Cardiovascular Disease

## 2014-08-06 ENCOUNTER — Encounter: Payer: Self-pay | Admitting: Cardiovascular Disease

## 2014-08-06 VITALS — BP 148/78 | HR 60 | Ht 62.0 in | Wt 187.0 lb

## 2014-08-06 DIAGNOSIS — R011 Cardiac murmur, unspecified: Secondary | ICD-10-CM | POA: Diagnosis not present

## 2014-08-06 DIAGNOSIS — R7303 Prediabetes: Secondary | ICD-10-CM

## 2014-08-06 DIAGNOSIS — I517 Cardiomegaly: Secondary | ICD-10-CM

## 2014-08-06 DIAGNOSIS — R7309 Other abnormal glucose: Secondary | ICD-10-CM

## 2014-08-06 DIAGNOSIS — I10 Essential (primary) hypertension: Secondary | ICD-10-CM

## 2014-08-06 DIAGNOSIS — E785 Hyperlipidemia, unspecified: Secondary | ICD-10-CM

## 2014-08-06 DIAGNOSIS — Z01810 Encounter for preprocedural cardiovascular examination: Secondary | ICD-10-CM

## 2014-08-06 NOTE — Assessment & Plan Note (Signed)
Discussed low carb diet.  Target hemoglobin A1c is 6.5 or less.  Continue current medications.  

## 2014-08-06 NOTE — Assessment & Plan Note (Signed)
Well controlled.  Continue current medications and low sodium Dash type diet.    

## 2014-08-06 NOTE — Assessment & Plan Note (Signed)
No need for stress test No chest pain and normal ECG  Vague history of "congestive failure"  With CE on CXR and SEM  Will order echo to look at LV/RV size and function and assess valves

## 2014-08-06 NOTE — Patient Instructions (Signed)
**Note De-Identified Quintasha Gren Obfuscation** Your physician recommends that you continue on your current medications as directed. Please refer to the Current Medication list given to you today.  Your physician has requested that you have an echocardiogram. Echocardiography is a painless test that uses sound waves to create images of your heart. It provides your doctor with information about the size and shape of your heart and how well your heart's chambers and valves are working. This procedure takes approximately one hour. There are no restrictions for this procedure.  Your physician recommends that you schedule a follow-up appointment in: as needed

## 2014-08-06 NOTE — Progress Notes (Signed)
Patient ID: Danielle Parrish, female   DOB: 07-Nov-1941, 73 y.o.   MRN: [id number]    73 yo referred by Dr Linden Dolin for preop clearance.  She has failed injections and needs arthroscopic left Knee surgery.  She indicates having "congestive" failure a long time ago but I see no documentation of this.  She has mild cardiomegaly on CXR.  No PND, orthopnea, edema.  She has HTN and elevated lipids on Rx.  She has had two myovues in past that were normal but none recently.  No chest pain  Activity limited by knees.  Compliant with meds. No previous anesthetic or bleeding issues.  She fell/tripped two days ago and has a black eye .       ROS: Denies fever, malais, weight loss, blurry vision, decreased visual acuity, cough, sputum, SOB, hemoptysis, pleuritic pain, palpitaitons, heartburn, abdominal pain, melena, lower extremity edema, claudication, or rash.  All other systems reviewed and negative   General: Affect appropriate Healthy:  appears stated age 49: echymosis over right orbit  Neck supple with no adenopathy JVP normal no bruits no thyromegaly Lungs clear with no wheezing and good diaphragmatic motion Heart:  S1/S2 SEM soft  murmur,rub, gallop or click PMI normal Abdomen: benighn, BS positve, no tenderness, no AAA no bruit.  No HSM or HJR Distal pulses intact with no bruits No edema Neuro non-focal Skin warm and dry No muscular weakness  Medications Current Outpatient Prescriptions  Medication Sig Dispense Refill  . amLODipine (NORVASC) 5 MG tablet Take 1 tablet (5 mg total) by mouth daily.  90 tablet  2  . aspirin 81 MG tablet Take 81 mg by mouth daily.      Marland Kitchen atenolol (TENORMIN) 100 MG tablet TAKE ONE TABLET DAILY  90 tablet  3  . benazepril (LOTENSIN) 40 MG tablet Take 1 tablet (40 mg total) by mouth daily.  90 tablet  3  . calcium-vitamin D (OSCAL WITH D) 500-200 MG-UNIT per tablet Take 1 tablet by mouth.      . diclofenac sodium (VOLTAREN) 1 % GEL Apply 2 g topically 4 (four)  times daily.      . diphenhydrAMINE (BENADRYL) 25 MG tablet Take 25 mg by mouth 2 (two) times daily.      . ergocalciferol (VITAMIN D2) 50000 UNITS capsule Take 1 capsule (50,000 Units total) by mouth once a week.  12 capsule  0  . fenofibrate 160 MG tablet Take 1 tablet (160 mg total) by mouth daily.  90 tablet  0  . Ferrous Sulfate 90 (18 FE) MG TABS Take 90 mg by mouth 2 (two) times daily.  60 each  6  . hydrochlorothiazide (HYDRODIURIL) 25 MG tablet Take 1 tablet (25 mg total) by mouth daily.  90 tablet  3  . HYDROcodone-acetaminophen (NORCO) 7.5-325 MG per tablet Take 1 tablet by mouth every 6 (six) hours as needed.  45 tablet  0  . levothyroxine (SYNTHROID, LEVOTHROID) 100 MCG tablet Take 1 tablet (100 mcg total) by mouth daily before breakfast.  15 tablet  0  . lovastatin (MEVACOR) 40 MG tablet Take 1 tablet (40 mg total) by mouth at bedtime.  90 tablet  3  . Multiple Vitamin (MULTIVITAMIN) tablet Take 1 tablet by mouth daily.      . niacin (SLO-NIACIN) 500 MG tablet Take 500 mg by mouth 2 (two) times daily with a meal.      . omeprazole (PRILOSEC) 40 MG capsule TAKE (1) CAPSULE BY MOUTH EACH MORNING.  90 capsule  3  . sertraline (ZOLOFT) 100 MG tablet TAKE (2) TABLETS BY MOUTH DAILY  180 tablet  3   No current facility-administered medications for this visit.    Allergies Crestor; Nsaids; Penicillins; and Vytorin  Family History: Family History  Problem Relation Age of Onset  . Hypertension Mother   . Stroke Mother   . Cancer Father     Social History: History   Social History  . Marital Status: Married    Spouse Name: N/A    Number of Children: N/A  . Years of Education: N/A   Occupational History  . Not on file.   Social History Main Topics  . Smoking status: Never Smoker   . Smokeless tobacco: Not on file  . Alcohol Use: No  . Drug Use: No  . Sexual Activity: Not on file   Other Topics Concern  . Not on file   Social History Narrative  . No narrative on  file    Electrocardiogram:  SR rate 51 normal   Assessment and Plan

## 2014-08-06 NOTE — Addendum Note (Signed)
**Note De-Identified Sherill Mangen Obfuscation** Addended by: Dennie Fetters on: 08/06/2014 09:46 AM   Modules accepted: Orders

## 2014-08-06 NOTE — Assessment & Plan Note (Signed)
Cholesterol is at goal.  Continue current dose of statin and diet Rx.  No myalgias or side effects.  F/U  LFT's in 6 months. Lab Results  Component Value Date   LDLCALC 71 06/30/2014

## 2014-08-07 ENCOUNTER — Telehealth: Payer: Self-pay | Admitting: Family Medicine

## 2014-08-07 ENCOUNTER — Telehealth: Payer: Self-pay | Admitting: *Deleted

## 2014-08-07 DIAGNOSIS — G894 Chronic pain syndrome: Secondary | ICD-10-CM

## 2014-08-07 MED ORDER — HYDROCODONE-ACETAMINOPHEN 7.5-325 MG PO TABS: 1.0000 | ORAL_TABLET | Freq: Four times a day (QID) | ORAL | Status: DC | PRN

## 2014-08-07 NOTE — Telephone Encounter (Signed)
Patient aware.

## 2014-08-07 NOTE — Telephone Encounter (Signed)
Aware,rx ready. 

## 2014-08-07 NOTE — Telephone Encounter (Signed)
Requesting refill on hydrocodone 7.5/325. Last written 06/30/14 for #45. If approved route to nurse to call and pickup.

## 2014-08-10 ENCOUNTER — Ambulatory Visit: Payer: Medicare Other | Admitting: Cardiology

## 2014-08-19 ENCOUNTER — Other Ambulatory Visit: Payer: Self-pay

## 2014-08-19 ENCOUNTER — Other Ambulatory Visit (INDEPENDENT_AMBULATORY_CARE_PROVIDER_SITE_OTHER): Payer: Medicare Other

## 2014-08-19 DIAGNOSIS — R011 Cardiac murmur, unspecified: Secondary | ICD-10-CM | POA: Diagnosis not present

## 2014-08-19 DIAGNOSIS — I517 Cardiomegaly: Secondary | ICD-10-CM

## 2014-08-19 DIAGNOSIS — I359 Nonrheumatic aortic valve disorder, unspecified: Secondary | ICD-10-CM

## 2014-09-08 ENCOUNTER — Telehealth: Payer: Self-pay | Admitting: Family Medicine

## 2014-09-08 DIAGNOSIS — G894 Chronic pain syndrome: Secondary | ICD-10-CM

## 2014-09-08 MED ORDER — HYDROCODONE-ACETAMINOPHEN 7.5-325 MG PO TABS: 1.0000 | ORAL_TABLET | Freq: Four times a day (QID) | ORAL | Status: DC | PRN

## 2014-09-08 NOTE — Telephone Encounter (Signed)
Patient aware rx ready to be picked up 

## 2014-09-11 ENCOUNTER — Telehealth: Payer: Self-pay | Admitting: Cardiovascular Disease

## 2014-09-11 NOTE — Telephone Encounter (Signed)
Patient would like results from ECHO and also questioning them being sent to another doctor to clear her for surgery

## 2014-09-11 NOTE — Telephone Encounter (Signed)
**Note De-Identified Danielle Parrish Obfuscation** The pt is due to have arthroscopic left Knee surgery with Dr Linden Dolin. She states that it was her understanding that if her Echo was ok she could have her surgery. Her Echo results were:  EF normal nothing too bad AV sclerosis not stenosis. She wants to know if she is cleared and if so he needs [place] to fax clearance to Dr Linden Dolin. Note sent to Dr Johnsie Cancel.

## 2014-09-13 NOTE — Telephone Encounter (Signed)
Ok to have surgery

## 2014-09-14 ENCOUNTER — Telehealth: Payer: Self-pay

## 2014-09-14 NOTE — Telephone Encounter (Signed)
There are no MRI results in [facility]. Where did pt have this done at?

## 2014-09-14 NOTE — Telephone Encounter (Signed)
Patient had MRI in [place]. This was ordered through Dr. Johnsie Cancel for preop clearance with Dr. Linden Dolin. Advised that she will have to obtain these results from the ordering physician and that he will have to forward the results and the preop clearance to Dr. Linden Dolin. Patient stated understanding.

## 2014-09-14 NOTE — Telephone Encounter (Signed)
Wants MRI results 

## 2014-09-14 NOTE — Telephone Encounter (Signed)
After reviewing chart it appears that patient is referring to an Echo. Not an MRI.

## 2014-09-14 NOTE — Telephone Encounter (Signed)
THIS  NOTE  FAXED  TO [facility]

## 2014-09-15 ENCOUNTER — Other Ambulatory Visit: Payer: Self-pay | Admitting: Surgical

## 2014-09-24 ENCOUNTER — Encounter (HOSPITAL_COMMUNITY): Payer: Self-pay | Admitting: Pharmacy Technician

## 2014-09-24 NOTE — Patient Instructions (Addendum)
Danielle Parrish  09/24/2014   Your procedure is scheduled on:  09/30/2014    Report to [facility].  Follow the Signs to [place] at  1200noon  Call this number if you have problems the morning of surgery: [phone]   Remember:   Do not eat food after midnite. May have clear liquids until 0730am then npo.    Take these medicines the morning of surgery with A SIP OF WATER: Amlodipine ( Norvasc), Hydrocodone if needed, Levothyroxine, Prilosec, Zoloft, Ocean nasal spray    Do not wear jewelry, make-up or nail polish.  Do not wear lotions, powders, or perfumes.  deodorant.  Do not shave 48 hours prior to surgery.   Do not bring valuables to the hospital.  Contacts, dentures or bridgework may not be worn into surgery.  Leave suitcase in the car. After surgery it may be brought to your room.  For patients admitted to the hospital, checkout time is 11:00 AM the day of  discharge.       Please read over the following fact sheets that you were given:    [place] Allowed                                                                     Foods Excluded  Coffee and tea, regular and decaf                             liquids that you cannot  Plain Jell-O in any flavor                                             see through such as: Fruit ices (not with fruit pulp)                                     milk, soups, orange juice  Iced Popsicles                                    All solid food Carbonated beverages, regular and diet                                    Cranberry, grape and apple juices Sports drinks like Gatorade Lightly seasoned clear broth or consume(fat free) Sugar, honey syrup  Sample Menu Breakfast                                Lunch                                     Supper Cranberry juice  Beef broth                            Chicken broth Jell-O                                     Grape juice                            Apple juice Coffee or tea                        Jell-O                                      Popsicle                                                Coffee or tea                        Coffee or tea  _____________________________________________________________________  [facility] - Preparing for Surgery Before surgery, you can play an important role.  Because skin is not sterile, your skin needs to be as free of germs as possible.  You can reduce the number of germs on your skin by washing with CHG (chlorahexidine gluconate) soap before surgery.  CHG is an antiseptic cleaner which kills germs and bonds with the skin to continue killing germs even after washing. Please DO NOT use if you have an allergy to CHG or antibacterial soaps.  If your skin becomes reddened/irritated stop using the CHG and inform your nurse when you arrive at Short Stay. Do not shave (including legs and underarms) for at least 48 hours prior to the first CHG shower.  You may shave your face/neck. Please follow these instructions carefully:  1.  Shower with CHG Soap the night before surgery and the  morning of Surgery.  2.  If you choose to wash your hair, wash your hair first as usual with your  normal  shampoo.  3.  After you shampoo, rinse your hair and body thoroughly to remove the  shampoo.                           4.  Use CHG as you would any other liquid soap.  You can apply chg directly  to the skin and wash                       Gently with a scrungie or clean washcloth.  5.  Apply the CHG Soap to your body ONLY FROM THE NECK DOWN.   Do not use on face/ open                           Wound or open sores. Avoid contact with eyes, ears mouth and genitals (private parts).                       Wash face,  Genitals (private parts) with your normal soap.             6.  Wash thoroughly, paying special attention to the area where your surgery  will be performed.  7.  Thoroughly rinse your body with warm water  from the neck down.  8.  DO NOT shower/wash with your normal soap after using and rinsing off  the CHG Soap.                9.  Pat yourself dry with a clean towel.            10.  Wear clean pajamas.            11.  Place clean sheets on your bed the night of your first shower and do not  sleep with pets. Day of Surgery : Do not apply any lotions/deodorants the morning of surgery.  Please wear clean clothes to the hospital/surgery center.  FAILURE TO FOLLOW THESE INSTRUCTIONS MAY RESULT IN THE CANCELLATION OF YOUR SURGERY PATIENT SIGNATURE_________________________________  NURSE SIGNATURE__________________________________  ________________________________________________________________________  coughing and deep breathing exercises, leg exercises

## 2014-09-28 ENCOUNTER — Encounter (INDEPENDENT_AMBULATORY_CARE_PROVIDER_SITE_OTHER): Payer: Self-pay

## 2014-09-28 ENCOUNTER — Encounter (HOSPITAL_COMMUNITY)
Admission: RE | Admit: 2014-09-28 | Discharge: 2014-09-28 | Disposition: A | Discharge status: Home / Self Care | Payer: Medicare Other | Source: Ambulatory Visit | Attending: Orthopedic Surgery | Admitting: Orthopedic Surgery

## 2014-09-28 ENCOUNTER — Encounter (HOSPITAL_COMMUNITY): Payer: Self-pay

## 2014-09-28 DIAGNOSIS — G4730 Sleep apnea, unspecified: Secondary | ICD-10-CM | POA: Diagnosis not present

## 2014-09-28 DIAGNOSIS — F419 Anxiety disorder, unspecified: Secondary | ICD-10-CM | POA: Diagnosis not present

## 2014-09-28 DIAGNOSIS — E559 Vitamin D deficiency, unspecified: Secondary | ICD-10-CM | POA: Diagnosis not present

## 2014-09-28 DIAGNOSIS — M179 Osteoarthritis of knee, unspecified: Secondary | ICD-10-CM | POA: Diagnosis not present

## 2014-09-28 DIAGNOSIS — S83242A Other tear of medial meniscus, current injury, left knee, initial encounter: Secondary | ICD-10-CM | POA: Diagnosis not present

## 2014-09-28 DIAGNOSIS — I10 Essential (primary) hypertension: Secondary | ICD-10-CM | POA: Diagnosis not present

## 2014-09-28 DIAGNOSIS — E039 Hypothyroidism, unspecified: Secondary | ICD-10-CM | POA: Diagnosis not present

## 2014-09-28 DIAGNOSIS — Y929 Unspecified place or not applicable: Secondary | ICD-10-CM | POA: Diagnosis not present

## 2014-09-28 DIAGNOSIS — X58XXXA Exposure to other specified factors, initial encounter: Secondary | ICD-10-CM | POA: Diagnosis not present

## 2014-09-28 DIAGNOSIS — Z886 Allergy status to analgesic agent status: Secondary | ICD-10-CM | POA: Diagnosis not present

## 2014-09-28 DIAGNOSIS — E785 Hyperlipidemia, unspecified: Secondary | ICD-10-CM | POA: Diagnosis not present

## 2014-09-28 DIAGNOSIS — K219 Gastro-esophageal reflux disease without esophagitis: Secondary | ICD-10-CM | POA: Diagnosis not present

## 2014-09-28 DIAGNOSIS — Z888 Allergy status to other drugs, medicaments and biological substances status: Secondary | ICD-10-CM | POA: Diagnosis not present

## 2014-09-28 DIAGNOSIS — Z880 Allergy status to penicillin: Secondary | ICD-10-CM | POA: Diagnosis not present

## 2014-09-28 DIAGNOSIS — M810 Age-related osteoporosis without current pathological fracture: Secondary | ICD-10-CM | POA: Diagnosis not present

## 2014-09-28 DIAGNOSIS — F329 Major depressive disorder, single episode, unspecified: Secondary | ICD-10-CM | POA: Diagnosis not present

## 2014-09-28 DIAGNOSIS — M659 Synovitis and tenosynovitis, unspecified: Secondary | ICD-10-CM | POA: Diagnosis not present

## 2014-09-28 HISTORY — DX: Hypothyroidism, unspecified: E03.9

## 2014-09-28 HISTORY — DX: Depression, unspecified: F32.A

## 2014-09-28 HISTORY — DX: Major depressive disorder, single episode, unspecified: F32.9

## 2014-09-28 HISTORY — DX: Sleep apnea, unspecified: G47.30

## 2014-09-28 LAB — CBC
HCT: 37.7 % (ref 36.0–46.0)
Hemoglobin: 12.5 g/dL (ref 12.0–15.0)
MCH: 30.6 pg (ref 26.0–34.0)
MCHC: 33.2 g/dL (ref 30.0–36.0)
MCV: 92.4 fL (ref 78.0–100.0)
Platelets: 267 10*3/uL (ref 150–400)
RBC: 4.08 MIL/uL (ref 3.87–5.11)
RDW: 13.2 % (ref 11.5–15.5)
WBC: 4.9 10*3/uL (ref 4.0–10.5)

## 2014-09-28 LAB — BASIC METABOLIC PANEL
Anion gap: 10 (ref 5–15)
BUN: 18 mg/dL (ref 6–23)
CO2: 31 mEq/L (ref 19–32)
Calcium: 10.2 mg/dL (ref 8.4–10.5)
Chloride: 97 mEq/L (ref 96–112)
Creatinine, Ser: 0.95 mg/dL (ref 0.50–1.10)
GFR calc Af Amer: 67 mL/min — ABNORMAL LOW (ref >90)
GFR calc non Af Amer: 58 mL/min — ABNORMAL LOW (ref >90)
Glucose, Bld: 108 mg/dL — ABNORMAL HIGH (ref 70–99)
Potassium: 5 mEq/L (ref 3.7–5.3)
Sodium: 138 mEq/L (ref 137–147)

## 2014-09-28 NOTE — Progress Notes (Signed)
Called and left Saintclair Halsted a message regarding ADMIT versus AMBULATORY SURGERY since patient is scheduled for a left knee arthroscopy.

## 2014-09-28 NOTE — Progress Notes (Signed)
ECHO- 08/19/14 [facility]  Dr Jenkins Rouge- 09/13/14 Clearance on chart  LOV with Dr Jenkins Rouge - 08/06/14- [facility]

## 2014-09-29 ENCOUNTER — Other Ambulatory Visit (HOSPITAL_COMMUNITY): Payer: Self-pay | Admitting: Orthopedic Surgery

## 2014-09-29 ENCOUNTER — Ambulatory Visit (HOSPITAL_BASED_OUTPATIENT_CLINIC_OR_DEPARTMENT_OTHER)
Admission: RE | Admit: 2014-09-29 | Discharge: 2014-09-29 | Disposition: A | Discharge status: Home / Self Care | Payer: Medicare Other | Source: Ambulatory Visit | Attending: Cardiovascular Disease | Admitting: Cardiovascular Disease

## 2014-09-29 DIAGNOSIS — I82402 Acute embolism and thrombosis of unspecified deep veins of left lower extremity: Secondary | ICD-10-CM

## 2014-09-29 DIAGNOSIS — M7989 Other specified soft tissue disorders: Secondary | ICD-10-CM

## 2014-09-29 DIAGNOSIS — Z01818 Encounter for other preprocedural examination: Secondary | ICD-10-CM

## 2014-09-29 DIAGNOSIS — S83242A Other tear of medial meniscus, current injury, left knee, initial encounter: Secondary | ICD-10-CM | POA: Diagnosis not present

## 2014-09-29 NOTE — Progress Notes (Signed)
Left Lower Ext. Venous Duplex Completed. Preliminary results by tech - Negative for DVT or SVT. There is a large complex mass behind the left knee extending into the proximal calf measuring approximately 5.6 x 1.6 x 2.9 cm. Oda Cogan, BS, RDMS, RVT

## 2014-09-29 NOTE — H&P (Signed)
Danielle Parrish is an 73 y.o. female.   Chief Complaint: left knee pain HPI: The patient presented with the chief complaint of left knee pain in February. She reported having pain in the left knee for several years but worse recently. She also had locking, catching, and swelling of the knee. She did not improve with conservative treatment including cortisone injections. MRI showed a complex tear of the medial meniscus of the left knee.    Past Medical History  Diagnosis Date  . Hypertension   . Thyroid disease   . Osteoporosis   . Hyperlipidemia   . Vitamin D deficiency   . Anxiety   . GERD (gastroesophageal reflux disease)   . Arthritis   . Chronic pain of multiple joints   . Sleep apnea     cpap   . Hypothyroidism   . Depression     Past Surgical History  Procedure Laterality Date  . Abdominal hernia repair    . Abdominal hysterectomy  1977    Family History  Problem Relation Age of Onset  . Hypertension Mother   . Stroke Mother   . Cancer Father    Social History:  reports that she has never smoked. She has never used smokeless tobacco. She reports that she does not drink alcohol or use illicit drugs.  Allergies:  Allergies  Allergen Reactions  . Crestor [Rosuvastatin] Other (See Comments)    Body aches.   . Nsaids     Burns stomach.   . Penicillins Hives  . Vytorin [Ezetimibe-Simvastatin]     Body aches.      Current outpatient prescriptions: acetaminophen (TYLENOL) 500 MG tablet, Take 500 mg by mouth once as needed for mild pain or headache., Disp: , Rfl: ;   amLODipine (NORVASC) 5 MG tablet, Take 5 mg by mouth every morning., Disp: , Rfl: ;   aspirin 81 MG tablet, Take 81 mg by mouth at bedtime. , Disp: , Rfl: ;   Aspirin-Acetaminophen (GOODYS BODY PAIN PO), Take 1 each by mouth once as needed (pain.)., Disp: , Rfl:  atenolol (TENORMIN) 100 MG tablet, Take 100 mg by mouth every evening., Disp: , Rfl: ;   benazepril (LOTENSIN) 40 MG tablet, Take 40 mg by  mouth every evening., Disp: , Rfl: ;   calcium-vitamin D (OSCAL WITH D) 500-200 MG-UNIT per tablet, Take 1 tablet by mouth every morning. , Disp: , Rfl: ;   diphenhydrAMINE (BENADRYL) 25 MG tablet, Take 25 mg by mouth 2 (two) times daily as needed for allergies. , Disp: , Rfl:  fenofibrate 160 MG tablet, Take 160 mg by mouth at bedtime., Disp: , Rfl: ;   Ferrous Sulfate 90 (18 FE) MG TABS, Take 1 tablet by mouth every morning., Disp: , Rfl: ;   hydrochlorothiazide (HYDRODIURIL) 25 MG tablet, Take 25 mg by mouth every morning., Disp: , Rfl: ;   HYDROcodone-acetaminophen (NORCO) 7.5-325 MG per tablet, Take 1 tablet by mouth every 6 (six) hours as needed for moderate pain., Disp: , Rfl:  levothyroxine (SYNTHROID, LEVOTHROID) 100 MCG tablet, Take 100 mcg by mouth daily before breakfast., Disp: , Rfl: ;   lovastatin (MEVACOR) 40 MG tablet, Take 40 mg by mouth at bedtime., Disp: , Rfl: ;   Multiple Vitamin (MULTIVITAMIN) tablet, Take 1 tablet by mouth every morning. , Disp: , Rfl: ;   niacin (SLO-NIACIN) 500 MG tablet, Take 500 mg by mouth at bedtime. , Disp: , Rfl:  omeprazole (PRILOSEC) 40 MG capsule, Take 40 mg  by mouth every morning., Disp: , Rfl: ;  sertraline (ZOLOFT) 100 MG tablet, Take 100 mg by mouth every morning., Disp: , Rfl: ;  sodium chloride (OCEAN) 0.65 % SOLN nasal spray, Place 1 spray into both nostrils as needed for congestion., Disp: , Rfl: ;   Vitamin D, Ergocalciferol, (DRISDOL) 50000 UNITS CAPS capsule, Take 50,000 Units by mouth every 7 (seven) days., Disp: , Rfl:  VITAMIN E PO, Take 1 tablet by mouth every morning., Disp: , Rfl:   Results for orders placed during the hospital encounter of 09/28/14 (from the past 48 hour(s))  CBC     Status: None   Collection Time    09/28/14 11:05 AM      Result Value Ref Range   WBC 4.9  4.0 - 10.5 K/uL   RBC 4.08  3.87 - 5.11 MIL/uL   Hemoglobin 12.5  12.0 - 15.0 g/dL   HCT 37.7  36.0 - 46.0 %   MCV 92.4  78.0 - 100.0 fL   MCH 30.6  26.0  - 34.0 pg   MCHC 33.2  30.0 - 36.0 g/dL   RDW 13.2  11.5 - 15.5 %   Platelets 267  150 - 400 K/uL  BASIC METABOLIC PANEL     Status: Abnormal   Collection Time    09/28/14 11:05 AM      Result Value Ref Range   Sodium 138  137 - 147 mEq/L   Potassium 5.0  3.7 - 5.3 mEq/L   Chloride 97  96 - 112 mEq/L   CO2 31  19 - 32 mEq/L   Glucose, Bld 108 (*) 70 - 99 mg/dL   BUN 18  6 - 23 mg/dL   Creatinine, Ser 0.95  0.50 - 1.10 mg/dL   Calcium 10.2  8.4 - 10.5 mg/dL   GFR calc non Af Amer 58 (*) >90 mL/min   GFR calc Af Amer 67 (*) >90 mL/min   Comment: (NOTE)     The eGFR has been calculated using the CKD EPI equation.     This calculation has not been validated in all clinical situations.     eGFR's persistently <90 mL/min signify possible Chronic Kidney     Disease.   Anion gap 10  5 - 15    Review of Systems  Constitutional: Negative.   HENT: Negative.   Eyes: Negative.   Respiratory: Positive for shortness of breath. Negative for cough, hemoptysis, sputum production and wheezing.        SOB on exertion  Cardiovascular: Negative.   Gastrointestinal: Positive for heartburn. Negative for nausea, vomiting, abdominal pain, diarrhea, constipation, blood in stool and melena.  Genitourinary: Negative.   Musculoskeletal: Positive for back pain and joint pain. Negative for falls, myalgias and neck pain.       Left knee pain  Skin: Negative.   Neurological: Negative.   Endo/Heme/Allergies: Negative.   Psychiatric/Behavioral: Negative.    Vitals Weight: 180 lb Height: 62 in Body Surface Area: 1.89 m Body Mass Index: 32.92 kg/m Pulse: 55 (Regular) BP: 157/73 (Sitting, Left Arm, Standard)  Physical Exam  Constitutional: She is oriented to person, place, and time. She appears well-developed and well-nourished. No distress.  HENT:  Head: Normocephalic and atraumatic.  Right Ear: External ear normal.  Left Ear: External ear normal.  Nose: Nose normal.  Mouth/Throat:  Oropharynx is clear and moist.  Eyes: Conjunctivae and EOM are normal.  Neck: Normal range of motion. Neck supple.  Cardiovascular: Normal rate,  regular rhythm, normal heart sounds and intact distal pulses.   No murmur heard. Respiratory: Effort normal and breath sounds normal. No respiratory distress. She has no wheezes.  GI: Soft. Bowel sounds are normal.  Musculoskeletal:       Right hip: Normal.       Left hip: Normal.       Right knee: Normal.       Left knee: She exhibits decreased range of motion, swelling and effusion. She exhibits no erythema. Tenderness found. Medial joint line and lateral joint line tenderness noted.       Right lower leg: She exhibits no tenderness and no swelling.       Left lower leg: She exhibits no tenderness and no swelling.  Neurological: She is alert and oriented to person, place, and time. She has normal strength and normal reflexes. No sensory deficit.  Skin: No rash noted. She is not diaphoretic. No erythema.  Psychiatric: She has a normal mood and affect. Her behavior is normal.     Assessment/Plan Left knee, medial meniscus tear She has a complex tear of the medial meniscus in her left knee. She needs a left knee arthroscopy with partial medial menisectomy. Risks and benefits of the procedure discussed with the patient by Dr. Gladstone Lighter.    H&P performed by Dr. Latanya Maudlin Documented by Ardeen Jourdain, PA-C   [place], Briza Bark Ander Purpura 09/29/2014, 5:12 PM

## 2014-09-30 ENCOUNTER — Ambulatory Visit (HOSPITAL_COMMUNITY)
Admission: RE | Admit: 2014-09-30 | Discharge: 2014-09-30 | Disposition: A | Discharge status: Home / Self Care | Payer: Medicare Other | Source: Ambulatory Visit | Attending: Orthopedic Surgery | Admitting: Orthopedic Surgery

## 2014-09-30 ENCOUNTER — Encounter (HOSPITAL_COMMUNITY): Payer: Self-pay | Admitting: *Deleted

## 2014-09-30 ENCOUNTER — Encounter (HOSPITAL_COMMUNITY)
Admission: RE | Disposition: A | Discharge status: Home / Self Care | Payer: Self-pay | Source: Ambulatory Visit | Attending: Orthopedic Surgery

## 2014-09-30 ENCOUNTER — Inpatient Hospital Stay (HOSPITAL_COMMUNITY): Payer: Medicare Other | Admitting: Anesthesiology

## 2014-09-30 ENCOUNTER — Encounter (HOSPITAL_COMMUNITY): Payer: Medicare Other | Admitting: Anesthesiology

## 2014-09-30 DIAGNOSIS — M1990 Unspecified osteoarthritis, unspecified site: Secondary | ICD-10-CM | POA: Diagnosis not present

## 2014-09-30 DIAGNOSIS — X58XXXA Exposure to other specified factors, initial encounter: Secondary | ICD-10-CM | POA: Insufficient documentation

## 2014-09-30 DIAGNOSIS — F419 Anxiety disorder, unspecified: Secondary | ICD-10-CM | POA: Insufficient documentation

## 2014-09-30 DIAGNOSIS — M1712 Unilateral primary osteoarthritis, left knee: Secondary | ICD-10-CM | POA: Diagnosis present

## 2014-09-30 DIAGNOSIS — M179 Osteoarthritis of knee, unspecified: Secondary | ICD-10-CM | POA: Diagnosis not present

## 2014-09-30 DIAGNOSIS — E559 Vitamin D deficiency, unspecified: Secondary | ICD-10-CM | POA: Insufficient documentation

## 2014-09-30 DIAGNOSIS — F329 Major depressive disorder, single episode, unspecified: Secondary | ICD-10-CM | POA: Insufficient documentation

## 2014-09-30 DIAGNOSIS — K219 Gastro-esophageal reflux disease without esophagitis: Secondary | ICD-10-CM | POA: Diagnosis not present

## 2014-09-30 DIAGNOSIS — I10 Essential (primary) hypertension: Secondary | ICD-10-CM | POA: Diagnosis not present

## 2014-09-30 DIAGNOSIS — M23304 Other meniscus derangements, unspecified medial meniscus, left knee: Secondary | ICD-10-CM | POA: Diagnosis not present

## 2014-09-30 DIAGNOSIS — E785 Hyperlipidemia, unspecified: Secondary | ICD-10-CM | POA: Insufficient documentation

## 2014-09-30 DIAGNOSIS — S83242A Other tear of medial meniscus, current injury, left knee, initial encounter: Secondary | ICD-10-CM

## 2014-09-30 DIAGNOSIS — E039 Hypothyroidism, unspecified: Secondary | ICD-10-CM | POA: Insufficient documentation

## 2014-09-30 DIAGNOSIS — Z888 Allergy status to other drugs, medicaments and biological substances status: Secondary | ICD-10-CM | POA: Insufficient documentation

## 2014-09-30 DIAGNOSIS — Z880 Allergy status to penicillin: Secondary | ICD-10-CM | POA: Insufficient documentation

## 2014-09-30 DIAGNOSIS — Z886 Allergy status to analgesic agent status: Secondary | ICD-10-CM | POA: Insufficient documentation

## 2014-09-30 DIAGNOSIS — S83249A Other tear of medial meniscus, current injury, unspecified knee, initial encounter: Secondary | ICD-10-CM | POA: Diagnosis present

## 2014-09-30 DIAGNOSIS — M810 Age-related osteoporosis without current pathological fracture: Secondary | ICD-10-CM | POA: Insufficient documentation

## 2014-09-30 DIAGNOSIS — Y929 Unspecified place or not applicable: Secondary | ICD-10-CM | POA: Insufficient documentation

## 2014-09-30 DIAGNOSIS — G4730 Sleep apnea, unspecified: Secondary | ICD-10-CM | POA: Insufficient documentation

## 2014-09-30 DIAGNOSIS — M659 Synovitis and tenosynovitis, unspecified: Secondary | ICD-10-CM | POA: Insufficient documentation

## 2014-09-30 HISTORY — PX: KNEE ARTHROSCOPY: SHX127

## 2014-09-30 SURGERY — ARTHROSCOPY, KNEE
Anesthesia: General | Laterality: Left

## 2014-09-30 MED ORDER — SODIUM CHLORIDE 0.9 % IJ SOLN
INTRAMUSCULAR | Status: AC
  Filled 2014-09-30: qty 10

## 2014-09-30 MED ORDER — FENTANYL CITRATE 0.05 MG/ML IJ SOLN
INTRAMUSCULAR | Status: DC | PRN
  Administered 2014-09-30 (×2): 50 ug via INTRAVENOUS

## 2014-09-30 MED ORDER — EPHEDRINE SULFATE 50 MG/ML IJ SOLN
INTRAMUSCULAR | Status: DC | PRN
  Administered 2014-09-30 (×3): 10 mg via INTRAVENOUS

## 2014-09-30 MED ORDER — DEXAMETHASONE SODIUM PHOSPHATE 10 MG/ML IJ SOLN
INTRAMUSCULAR | Status: AC
  Filled 2014-09-30: qty 1

## 2014-09-30 MED ORDER — BUPIVACAINE-EPINEPHRINE (PF) 0.25% -1:200000 IJ SOLN
INTRAMUSCULAR | Status: AC
  Filled 2014-09-30: qty 30

## 2014-09-30 MED ORDER — BUPIVACAINE HCL (PF) 0.5 % IJ SOLN
INTRAMUSCULAR | Status: DC | PRN
  Administered 2014-09-30: 10 mL

## 2014-09-30 MED ORDER — DEXAMETHASONE SODIUM PHOSPHATE 10 MG/ML IJ SOLN
INTRAMUSCULAR | Status: DC | PRN
  Administered 2014-09-30: 10 mg via INTRAVENOUS

## 2014-09-30 MED ORDER — LACTATED RINGERS IV SOLN
INTRAVENOUS | Status: DC
  Administered 2014-09-30: 1000 mL via INTRAVENOUS
  Administered 2014-09-30: 15:00:00 via INTRAVENOUS

## 2014-09-30 MED ORDER — BACITRACIN ZINC 500 UNIT/GM EX OINT
TOPICAL_OINTMENT | CUTANEOUS | Status: DC | PRN
  Administered 2014-09-30: 1 via TOPICAL

## 2014-09-30 MED ORDER — OXYCODONE HCL 5 MG PO TABS: 5.0000 mg | ORAL_TABLET | Freq: Once | ORAL | Status: DC | PRN

## 2014-09-30 MED ORDER — ONDANSETRON HCL 4 MG/2ML IJ SOLN
INTRAMUSCULAR | Status: AC
  Filled 2014-09-30: qty 2

## 2014-09-30 MED ORDER — MEPERIDINE HCL 50 MG/ML IJ SOLN: 6.2500 mg | INTRAMUSCULAR | Status: DC | PRN

## 2014-09-30 MED ORDER — LIDOCAINE HCL (CARDIAC) 20 MG/ML IV SOLN
INTRAVENOUS | Status: DC | PRN
  Administered 2014-09-30: 100 mg via INTRAVENOUS

## 2014-09-30 MED ORDER — HYDROMORPHONE HCL 1 MG/ML IJ SOLN: 0.2500 mg | INTRAMUSCULAR | Status: DC | PRN

## 2014-09-30 MED ORDER — CLINDAMYCIN PHOSPHATE 900 MG/50ML IV SOLN
900.0000 mg | INTRAVENOUS | Status: AC
  Administered 2014-09-30: 900 mg via INTRAVENOUS

## 2014-09-30 MED ORDER — PROPOFOL 10 MG/ML IV BOLUS
INTRAVENOUS | Status: DC | PRN
  Administered 2014-09-30: 200 mg via INTRAVENOUS

## 2014-09-30 MED ORDER — BACITRACIN-NEOMYCIN-POLYMYXIN 400-5-5000 EX OINT
TOPICAL_OINTMENT | CUTANEOUS | Status: AC
  Filled 2014-09-30: qty 1

## 2014-09-30 MED ORDER — BUPIVACAINE-EPINEPHRINE 0.25% -1:200000 IJ SOLN
INTRAMUSCULAR | Status: DC | PRN
  Administered 2014-09-30: 30 mL

## 2014-09-30 MED ORDER — PROMETHAZINE HCL 25 MG/ML IJ SOLN: 6.2500 mg | INTRAMUSCULAR | Status: DC | PRN

## 2014-09-30 MED ORDER — GLYCOPYRROLATE 0.2 MG/ML IJ SOLN
INTRAMUSCULAR | Status: DC | PRN
  Administered 2014-09-30: 0.2 mg via INTRAVENOUS

## 2014-09-30 MED ORDER — LACTATED RINGERS IR SOLN
Status: DC | PRN
  Administered 2014-09-30: 6000 mL

## 2014-09-30 MED ORDER — FENTANYL CITRATE 0.05 MG/ML IJ SOLN
INTRAMUSCULAR | Status: AC
  Filled 2014-09-30: qty 2

## 2014-09-30 MED ORDER — OXYCODONE-ACETAMINOPHEN 10-325 MG PO TABS: 1.0000 | ORAL_TABLET | ORAL | Status: DC | PRN

## 2014-09-30 MED ORDER — EPHEDRINE SULFATE 50 MG/ML IJ SOLN
INTRAMUSCULAR | Status: AC
  Filled 2014-09-30: qty 1

## 2014-09-30 MED ORDER — OXYCODONE HCL 5 MG/5ML PO SOLN
5.0000 mg | Freq: Once | ORAL | Status: DC | PRN
  Filled 2014-09-30: qty 5

## 2014-09-30 MED ORDER — ONDANSETRON HCL 4 MG/2ML IJ SOLN
INTRAMUSCULAR | Status: DC | PRN
  Administered 2014-09-30: 4 mg via INTRAVENOUS

## 2014-09-30 MED ORDER — BUPIVACAINE HCL (PF) 0.5 % IJ SOLN
INTRAMUSCULAR | Status: AC
  Filled 2014-09-30: qty 30

## 2014-09-30 MED ORDER — CLINDAMYCIN PHOSPHATE 900 MG/50ML IV SOLN
INTRAVENOUS | Status: AC
  Filled 2014-09-30: qty 50

## 2014-09-30 MED ORDER — SODIUM CHLORIDE 0.9 % IR SOLN
Status: AC
  Filled 2014-09-30: qty 1

## 2014-09-30 MED ORDER — PROPOFOL 10 MG/ML IV BOLUS
INTRAVENOUS | Status: AC
  Filled 2014-09-30: qty 20

## 2014-09-30 SURGICAL SUPPLY — 29 items
BANDAGE ELASTIC 4 VELCRO ST LF (GAUZE/BANDAGES/DRESSINGS) ×2 IMPLANT
BANDAGE ELASTIC 6 VELCRO ST LF (GAUZE/BANDAGES/DRESSINGS) ×2 IMPLANT
BLADE GREAT WHITE 4.2 (BLADE) IMPLANT
BLADE SURG SZ11 CARB STEEL (BLADE) IMPLANT
BNDG COHESIVE 6X5 TAN STRL LF (GAUZE/BANDAGES/DRESSINGS) ×2 IMPLANT
DRAPE SHEET LG 3/4 BI-LAMINATE (DRAPES) ×2 IMPLANT
DRSG EMULSION OIL 3X3 NADH (GAUZE/BANDAGES/DRESSINGS) ×2 IMPLANT
DRSG PAD ABDOMINAL 8X10 ST (GAUZE/BANDAGES/DRESSINGS) ×6 IMPLANT
DURAPREP 26ML APPLICATOR (WOUND CARE) ×2 IMPLANT
GAUZE SPONGE 4X4 12PLY STRL (GAUZE/BANDAGES/DRESSINGS) ×1 IMPLANT
GLOVE BIOGEL PI IND STRL 8 (GLOVE) ×1 IMPLANT
GLOVE BIOGEL PI INDICATOR 8 (GLOVE) ×1
GLOVE ECLIPSE 8.0 STRL XLNG CF (GLOVE) ×2 IMPLANT
GOWN STRL REUS W/TWL LRG LVL3 (GOWN DISPOSABLE) ×2 IMPLANT
GOWN STRL REUS W/TWL XL LVL3 (GOWN DISPOSABLE) ×2 IMPLANT
KIT BASIN OR (CUSTOM PROCEDURE TRAY) ×2 IMPLANT
MANIFOLD NEPTUNE II (INSTRUMENTS) ×2 IMPLANT
PACK ARTHROSCOPY WL (CUSTOM PROCEDURE TRAY) ×2 IMPLANT
PACK ICE MAXI GEL EZY WRAP (MISCELLANEOUS) ×2 IMPLANT
PAD MASON LEG HOLDER (PIN) ×2 IMPLANT
PADDING CAST COTTON 6X4 STRL (CAST SUPPLIES) ×1 IMPLANT
SET ARTHROSCOPY TUBING (MISCELLANEOUS) ×2
SET ARTHROSCOPY TUBING LN (MISCELLANEOUS) ×1 IMPLANT
SUT ETHILON 3 0 PS 1 (SUTURE) ×2 IMPLANT
SYR 20CC LL (SYRINGE) ×2 IMPLANT
TOWEL OR 17X26 10 PK STRL BLUE (TOWEL DISPOSABLE) ×2 IMPLANT
TUBING CONNECTING 10 (TUBING) ×2 IMPLANT
WAND 90 DEG TURBOVAC W/CORD (SURGICAL WAND) IMPLANT
WRAP KNEE MAXI GEL POST OP (GAUZE/BANDAGES/DRESSINGS) ×2 IMPLANT

## 2014-09-30 NOTE — Anesthesia Preprocedure Evaluation (Signed)
Anesthesia Evaluation  Patient identified by MRN, date of birth, ID band Patient awake    Reviewed: Allergy & Precautions, H&P , NPO status , Patient's Chart, lab work & pertinent test results, reviewed documented beta blocker date and time   Airway Mallampati: II TM Distance: >3 FB Neck ROM: Full    Dental no notable dental hx.    Pulmonary sleep apnea ,  breath sounds clear to auscultation  Pulmonary exam normal       Cardiovascular hypertension, Pt. on medications and Pt. on home beta blockers Rhythm:Regular Rate:Normal     Neuro/Psych PSYCHIATRIC DISORDERS Anxiety Depression negative neurological ROS     GI/Hepatic Neg liver ROS, GERD-  ,  Endo/Other  Hypothyroidism   Renal/GU negative Renal ROS     Musculoskeletal  (+) Arthritis -, Osteoarthritis,    Abdominal   Peds  Hematology negative hematology ROS (+)   Anesthesia Other Findings   Reproductive/Obstetrics negative OB ROS                           Anesthesia Physical Anesthesia Plan  ASA: II  Anesthesia Plan: General   Post-op Pain Management:    Induction: Intravenous  Airway Management Planned: LMA  Additional Equipment:   Intra-op Plan:   Post-operative Plan: Extubation in OR  Informed Consent: I have reviewed the patients History and Physical, chart, labs and discussed the procedure including the risks, benefits and alternatives for the proposed anesthesia with the patient or authorized representative who has indicated his/her understanding and acceptance.   Dental advisory given  Plan Discussed with: CRNA  Anesthesia Plan Comments:         Anesthesia Quick Evaluation

## 2014-09-30 NOTE — Interval H&P Note (Signed)
History and Physical Interval Note:  09/30/2014 1:41 PM  Danielle Parrish  has presented today for surgery, with the diagnosis of LEFT KNEE MEDIAL MENISCUS TEAR  The various methods of treatment have been discussed with the patient and family. After consideration of risks, benefits and other options for treatment, the patient has consented to  Procedure(s): LEFT ARTHROSCOPY KNEE (Left) as a surgical intervention .  The patient's history has been reviewed, patient examined, no change in status, stable for surgery.  I have reviewed the patient's chart and labs.  Questions were answered to the patient's satisfaction.     Livio Ledwith A

## 2014-09-30 NOTE — Progress Notes (Signed)
Obtained order for walker for patient status post left knee arthroscopy.

## 2014-09-30 NOTE — Transfer of Care (Signed)
Immediate Anesthesia Transfer of Care Note  Patient: Danielle Parrish  Procedure(s) Performed: Procedure(s): LEFT KNEE ARTHROSCOPY MEDIAL MENISECTOMY ABRASION CHONDROPLASTY SYNOVECTOMY SUPRAPATELLER CUFF (Left)  Patient Location: PACU  Anesthesia Type:General  Level of Consciousness: sedated  Airway & Oxygen Therapy: Patient Spontanous Breathing and Patient connected to face mask oxygen  Post-op Assessment: Report given to PACU RN and Post -op Vital signs reviewed and stable  Post vital signs: Reviewed and stable  Complications: No apparent anesthesia complications

## 2014-09-30 NOTE — Brief Op Note (Signed)
09/30/2014  2:53 PM  PATIENT:  Danielle Parrish  73 y.o. female  PRE-OPERATIVE DIAGNOSIS:  LEFT KNEE MEDIAL MENISCUS TEAR and Osteoarthritis  POST-OPERATIVE DIAGNOSIS:  LEFT KNEE MEDIAL MENISCUS TEAR and Osteoarthritis  PROCEDURE:  Procedure(s): LEFT KNEE ARTHROSCOPY MEDIAL MENISECTOMY ABRASION CHONDROPLASTY SYNOVECTOMY SUPRAPATELLER CUFF (Left). The Abraison Chondroplasty was of the Medial Femoral Condyle.  SURGEON:  Surgeon(s) and Role:    * Tobi Bastos, MD - Primary  :   ASSISTANTS: OR Tech  ANESTHESIA:   general  EBL:  Total I/O In: 900 [I.V.:900] Out: -   BLOOD ADMINISTERED:none  DRAINS: none   LOCAL MEDICATIONS USED:  MARCAINE  10cc 20f plain 0.5% was used locally at the start of the case and 30cc of 0.50% with Epinephrine was injected into the Knee Joint at the end of the case.  SPECIMEN:  No Specimen  DISPOSITION OF SPECIMEN:  N/A  COUNTS:  YES  TOURNIQUET:  * No tourniquets in log *  DICTATION: .Other Dictation: Dictation Number [id number]  PLAN OF CARE: Discharge to home after PACU  PATIENT DISPOSITION:  PACU - hemodynamically stable.   Delay start of Pharmacological VTE agent (>24hrs) due to surgical blood loss or risk of bleeding: yes

## 2014-09-30 NOTE — Op Note (Signed)
[name], Danielle Parrish NO.:  [id number]  MEDICAL RECORD NO.:  [id number]  LOCATION:  [facility]                         FACILITY:  [facility]  PHYSICIAN:  Kipp Brood. Kellyn Mccary, M.D.DATE OF BIRTH:  1941/05/20  DATE OF PROCEDURE:  09/30/2014 DATE OF DISCHARGE:  09/30/2014                              OPERATIVE REPORT   SURGEON:  Jori Moll A. Rayn Enderson, MD  ASSISTANT:  OR tech.  PREOPERATIVE DIAGNOSIS: 1. Osteoarthritis, left knee. 2. Complex tear, medial meniscus, left knee.  POSTOPERATIVE DIAGNOSIS: 1. Osteoarthritis, left knee. 2. Complex tear, medial meniscus, left knee.  OPERATION: 1. Synovectomy for chronic synovitis, suprapatellar pouch, left knee. 2. Medial meniscectomy, left knee. 3. Abrasion chondroplasty, medial femoral condyle, left knee.  DESCRIPTION OF PROCEDURE:  Under general anesthesia, routine orthopedic prepping and draping of the left lower extremity was carried out.  Left lower extremity was placed in a knee holder.  Appropriate time-out was first carried out.  I also marked the appropriate left leg in the holding area.  At this time, after sterile prep and draping, a small incision was made in the suprapatellar pouch.  Inflow cannula was inserted.  Also, she had 2 g of IV Ancef.  At this particular time, the knee was distended with fluid as I mentioned.  Another small punctate incision made in the anterolateral joint.  The arthroscope was entered from the lateral approach and a complete diagnostic arthroscopy was carried out.  I went up in the suprapatellar pouch with the ArthroCare and did a synovectomy.  She had extensive synovitis.  I went down in the lateral joint, she had some mild arthritic changes.  No major meniscal tears.  Cruciates were intact, and over the medial joint where the main problem was.  She had severe chondral loss of the tibial plateau and the femoral condyle.  I did an abrasion chondroplasty of medial femoral condyle.  Also, she  had a severe complex tear of the medial meniscus. We did a partial medial meniscectomy, thoroughly irrigated out the knee, inspected the knee.  There were no other abnormalities.  I closed all 3 punctate incisions with 3-0 nylon suture.  I injected 30 mL of 0.25% Marcaine with epinephrine in the joint.  Note, beginning her anesthesia was very light because of her pressure, so I did local injection only in the skin with plain Marcaine 0.5% about 10 mL.  After, the sterile dressings then were applied.  The patient left the operating room in satisfactory condition.  She will later need a total knee replacement.          ______________________________ Kipp Brood. Gladstone Lighter, M.D.     RAG/MEDQ  D:  09/30/2014  T:  09/30/2014  Job:  [id number]

## 2014-10-01 ENCOUNTER — Encounter (HOSPITAL_COMMUNITY): Payer: Self-pay | Admitting: Orthopedic Surgery

## 2014-10-01 NOTE — Anesthesia Postprocedure Evaluation (Signed)
Anesthesia Post Note  Patient: Danielle Parrish  Procedure(s) Performed: Procedure(s) (LRB): LEFT KNEE ARTHROSCOPY MEDIAL MENISECTOMY ABRASION CHONDROPLASTY SYNOVECTOMY SUPRAPATELLER CUFF (Left)  Anesthesia type: General  Patient location: PACU  Post pain: Pain level controlled  Post assessment: Post-op Vital signs reviewed  Last Vitals: BP 122/47  Pulse 66  Temp(Src) 36.6 C (Oral)  Resp 16  Ht 5\' 3"  (1.6 m)  Wt 185 lb (83.915 kg)  BMI 32.78 kg/m2  SpO2 98%  Post vital signs: Reviewed  Level of consciousness: sedated  Complications: No apparent anesthesia complications

## 2014-10-06 ENCOUNTER — Telehealth (HOSPITAL_COMMUNITY): Payer: Self-pay | Admitting: *Deleted

## 2014-10-31 ENCOUNTER — Ambulatory Visit (INDEPENDENT_AMBULATORY_CARE_PROVIDER_SITE_OTHER): Payer: Medicare Other | Admitting: Family Medicine

## 2014-10-31 VITALS — BP 131/70 | HR 55 | Temp 98.1°F | Ht 63.0 in | Wt 182.2 lb

## 2014-10-31 DIAGNOSIS — J069 Acute upper respiratory infection, unspecified: Secondary | ICD-10-CM

## 2014-10-31 MED ORDER — ERYTHROMYCIN BASE 333 MG PO TBEC: 333.0000 mg | DELAYED_RELEASE_TABLET | Freq: Three times a day (TID) | ORAL | Status: DC

## 2014-10-31 NOTE — Progress Notes (Signed)
   Subjective:    Patient ID: Danielle Parrish, female    DOB: 06/16/1941, 73 y.o.   MRN: [id number]  HPI C/o uri sx's.  She has had sinus drainage for 6 weeks.  She is spitting up green and yellow.  Review of Systems  Constitutional: Negative for fever.  HENT: Negative for ear pain.   Eyes: Negative for discharge.  Respiratory: Negative for cough.   Cardiovascular: Negative for chest pain.  Gastrointestinal: Negative for abdominal distention.  Endocrine: Negative for polyuria.  Genitourinary: Negative for difficulty urinating.  Musculoskeletal: Negative for gait problem and neck pain.  Skin: Negative for color change and rash.  Neurological: Negative for speech difficulty and headaches.  Psychiatric/Behavioral: Negative for agitation.       Objective:    BP 131/70 mmHg  Pulse 55  Temp(Src) 98.1 F (36.7 C) (Oral)  Ht 5\' 3"  (1.6 m)  Wt 182 lb 4 oz (82.668 kg)  BMI 32.29 kg/m2  SpO2 94%   Physical Exam  Constitutional: She is oriented to person, place, and time. She appears well-developed and well-nourished.  HENT:  Head: Normocephalic and atraumatic.  Mouth/Throat: Oropharynx is clear and moist.  Eyes: Pupils are equal, round, and reactive to light.  Neck: Normal range of motion. Neck supple.  Cardiovascular: Normal rate and regular rhythm.   No murmur heard. Pulmonary/Chest: Effort normal and breath sounds normal.  Abdominal: Soft. Bowel sounds are normal. There is no tenderness.  Neurological: She is alert and oriented to person, place, and time.  Skin: Skin is warm and dry.  Psychiatric: She has a normal mood and affect.          Assessment & Plan:     ICD-9-CM ICD-10-CM   1. URI (upper respiratory infection) 465.9 J06.9 erythromycin (ERY-TAB) 333 MG EC tablet   Push po fluids, rest, tylenol and motrin otc prn as directed for fever, arthralgias, and myalgias.  Follow up prn if sx's continue or persist.  Return if symptoms worsen or fail to  improve.  Lysbeth Penner FNP

## 2014-10-31 NOTE — Progress Notes (Signed)
   Subjective:    Patient ID: Danielle Parrish, female    DOB: [birth year], 73 y.o.   MRN: [id number]  HPI C/o cough congestion and uri sx's.  Review of Systems  Constitutional: Negative for fever.  HENT: Negative for ear pain.   Eyes: Negative for discharge.  Respiratory: Negative for cough.   Cardiovascular: Negative for chest pain.  Gastrointestinal: Negative for abdominal distention.  Endocrine: Negative for polyuria.  Genitourinary: Negative for difficulty urinating.  Musculoskeletal: Negative for gait problem and neck pain.  Skin: Negative for color change and rash.  Neurological: Negative for speech difficulty and headaches.  Psychiatric/Behavioral: Negative for agitation.       Objective:    BP 131/70 mmHg  Pulse 55  Temp(Src) 98.1 F (36.7 C) (Oral)  Ht 5\' 3"  (1.6 m)  Wt 182 lb 4 oz (82.668 kg)  BMI 32.29 kg/m2  SpO2 94%   Physical Exam  Constitutional: She is oriented to person, place, and time. She appears well-developed and well-nourished.  HENT:  Head: Normocephalic and atraumatic.  Mouth/Throat: Oropharynx is clear and moist.  Eyes: Pupils are equal, round, and reactive to light.  Neck: Normal range of motion. Neck supple.  Cardiovascular: Normal rate and regular rhythm.   No murmur heard. Pulmonary/Chest: Effort normal and breath sounds normal.  Abdominal: Soft. Bowel sounds are normal. There is no tenderness.  Neurological: She is alert and oriented to person, place, and time.  Skin: Skin is warm and dry.  Psychiatric: She has a normal mood and affect.          Assessment & Plan:  No diagnosis found.   No Follow-up on file.  Lysbeth Penner FNP

## 2014-11-02 ENCOUNTER — Telehealth: Payer: Self-pay | Admitting: Family Medicine

## 2014-11-02 ENCOUNTER — Other Ambulatory Visit: Payer: Self-pay | Admitting: *Deleted

## 2014-11-02 ENCOUNTER — Other Ambulatory Visit: Payer: Self-pay | Admitting: Family Medicine

## 2014-11-02 MED ORDER — DOXYCYCLINE HYCLATE 100 MG PO TABS: 100.0000 mg | ORAL_TABLET | Freq: Two times a day (BID) | ORAL | Status: DC

## 2014-11-02 NOTE — Telephone Encounter (Signed)
Patient was seen Saturday by you for URI . She needs cheaper medication.   Erythromycin cost too much .  She wanted to remind you she  can't take Z-packs as discussed with you. Please send in new script to [facility].  Thanks

## 2014-11-03 ENCOUNTER — Telehealth: Payer: Self-pay | Admitting: Family Medicine

## 2014-11-03 ENCOUNTER — Other Ambulatory Visit: Payer: Self-pay | Admitting: Family

## 2014-11-03 ENCOUNTER — Other Ambulatory Visit: Payer: Self-pay | Admitting: *Deleted

## 2014-11-03 NOTE — Telephone Encounter (Signed)
Last lipids 7/15.

## 2014-11-04 ENCOUNTER — Telehealth: Payer: Self-pay | Admitting: *Deleted

## 2014-11-04 MED ORDER — HYDROCHLOROTHIAZIDE 25 MG PO TABS: 25.0000 mg | ORAL_TABLET | ORAL | Status: DC

## 2014-11-04 NOTE — Telephone Encounter (Signed)
done

## 2014-11-16 ENCOUNTER — Other Ambulatory Visit: Payer: Self-pay | Admitting: Family

## 2014-11-16 ENCOUNTER — Telehealth: Payer: Self-pay | Admitting: Family

## 2014-11-16 NOTE — Telephone Encounter (Signed)
Diflucan called in.

## 2014-11-17 ENCOUNTER — Telehealth: Payer: Self-pay | Admitting: Family Medicine

## 2014-11-17 ENCOUNTER — Ambulatory Visit: Payer: Medicare Other | Admitting: *Deleted

## 2014-11-17 ENCOUNTER — Ambulatory Visit (INDEPENDENT_AMBULATORY_CARE_PROVIDER_SITE_OTHER): Payer: Medicare Other | Admitting: *Deleted

## 2014-11-17 DIAGNOSIS — Z23 Encounter for immunization: Secondary | ICD-10-CM

## 2014-11-17 MED ORDER — HYDROCODONE-ACETAMINOPHEN 7.5-325 MG PO TABS: 1.0000 | ORAL_TABLET | Freq: Four times a day (QID) | ORAL | Status: DC | PRN

## 2014-11-17 NOTE — Telephone Encounter (Signed)
Patient last seen by B. Oxford on 10-31-14 for URI.   Seen 06-30-14 by C. Hawks for hypertension.  Last time hydrocodone was filled was 09-08-14 for qty 45. Refill?

## 2014-11-17 NOTE — Telephone Encounter (Signed)
hydrcodone rx ready for pick up no more refills without being seen

## 2014-11-18 NOTE — Telephone Encounter (Signed)
Pt aware rx ready for pickup and that she won't get anymore refills until seen in office, pt voiced understanding.

## 2014-11-20 ENCOUNTER — Telehealth: Payer: Self-pay | Admitting: Nurse Practitioner

## 2014-11-20 MED ORDER — FLUCONAZOLE 150 MG PO TABS: 150.0000 mg | ORAL_TABLET | Freq: Once | ORAL | Status: DC

## 2014-11-20 NOTE — Telephone Encounter (Signed)
Diflucan sent to pharmacy.

## 2014-11-20 NOTE — Telephone Encounter (Signed)
Please advise 

## 2014-11-28 ENCOUNTER — Telehealth: Payer: Self-pay | Admitting: Nurse Practitioner

## 2014-11-28 ENCOUNTER — Other Ambulatory Visit: Payer: Self-pay | Admitting: Nurse Practitioner

## 2014-11-30 NOTE — Telephone Encounter (Signed)
Patient had a diflucan in November. Refill.

## 2014-12-02 ENCOUNTER — Encounter: Payer: Self-pay | Admitting: *Deleted

## 2014-12-02 MED ORDER — FLUCONAZOLE 150 MG PO TABS: 150.0000 mg | ORAL_TABLET | Freq: Once | ORAL | Status: DC

## 2015-01-07 ENCOUNTER — Telehealth: Payer: Self-pay | Admitting: Nurse Practitioner

## 2015-01-07 ENCOUNTER — Ambulatory Visit (INDEPENDENT_AMBULATORY_CARE_PROVIDER_SITE_OTHER): Payer: Medicare Other | Admitting: Nurse Practitioner

## 2015-01-07 ENCOUNTER — Encounter: Payer: Self-pay | Admitting: Nurse Practitioner

## 2015-01-07 VITALS — BP 136/65 | HR 56 | Temp 97.8°F | Ht 63.0 in | Wt 182.0 lb

## 2015-01-07 DIAGNOSIS — E039 Hypothyroidism, unspecified: Secondary | ICD-10-CM | POA: Diagnosis not present

## 2015-01-07 DIAGNOSIS — M1990 Unspecified osteoarthritis, unspecified site: Secondary | ICD-10-CM | POA: Diagnosis not present

## 2015-01-07 DIAGNOSIS — E669 Obesity, unspecified: Secondary | ICD-10-CM

## 2015-01-07 DIAGNOSIS — K219 Gastro-esophageal reflux disease without esophagitis: Secondary | ICD-10-CM | POA: Diagnosis not present

## 2015-01-07 DIAGNOSIS — E559 Vitamin D deficiency, unspecified: Secondary | ICD-10-CM | POA: Diagnosis not present

## 2015-01-07 DIAGNOSIS — R7303 Prediabetes: Secondary | ICD-10-CM

## 2015-01-07 DIAGNOSIS — D649 Anemia, unspecified: Secondary | ICD-10-CM | POA: Diagnosis not present

## 2015-01-07 DIAGNOSIS — R7309 Other abnormal glucose: Secondary | ICD-10-CM

## 2015-01-07 DIAGNOSIS — I10 Essential (primary) hypertension: Secondary | ICD-10-CM | POA: Diagnosis not present

## 2015-01-07 DIAGNOSIS — F419 Anxiety disorder, unspecified: Secondary | ICD-10-CM | POA: Diagnosis not present

## 2015-01-07 DIAGNOSIS — Z13820 Encounter for screening for osteoporosis: Secondary | ICD-10-CM | POA: Diagnosis not present

## 2015-01-07 DIAGNOSIS — E785 Hyperlipidemia, unspecified: Secondary | ICD-10-CM | POA: Diagnosis not present

## 2015-01-07 MED ORDER — HYDROCODONE-ACETAMINOPHEN 7.5-325 MG PO TABS: 1.0000 | ORAL_TABLET | Freq: Four times a day (QID) | ORAL | Status: DC | PRN

## 2015-01-07 MED ORDER — FENOFIBRATE 160 MG PO TABS: ORAL_TABLET | ORAL | Status: DC

## 2015-01-07 MED ORDER — BENAZEPRIL HCL 40 MG PO TABS: 40.0000 mg | ORAL_TABLET | Freq: Every evening | ORAL | Status: DC

## 2015-01-07 MED ORDER — AMLODIPINE BESYLATE 5 MG PO TABS: 5.0000 mg | ORAL_TABLET | ORAL | Status: DC

## 2015-01-07 MED ORDER — SERTRALINE HCL 100 MG PO TABS: 100.0000 mg | ORAL_TABLET | ORAL | Status: DC

## 2015-01-07 MED ORDER — LEVOTHYROXINE SODIUM 100 MCG PO TABS: 100.0000 ug | ORAL_TABLET | Freq: Every day | ORAL | Status: DC

## 2015-01-07 MED ORDER — LOVASTATIN 40 MG PO TABS: 40.0000 mg | ORAL_TABLET | Freq: Every day | ORAL | Status: DC

## 2015-01-07 MED ORDER — VITAMIN D (ERGOCALCIFEROL) 1.25 MG (50000 UNIT) PO CAPS: 50000.0000 [IU] | ORAL_CAPSULE | ORAL | Status: DC

## 2015-01-07 MED ORDER — ATENOLOL 100 MG PO TABS: 100.0000 mg | ORAL_TABLET | Freq: Every evening | ORAL | Status: DC

## 2015-01-07 MED ORDER — FERROUS SULFATE 90 (18 FE) MG PO TABS: 1.0000 | ORAL_TABLET | ORAL | Status: DC

## 2015-01-07 MED ORDER — OMEPRAZOLE 40 MG PO CPDR: 40.0000 mg | DELAYED_RELEASE_CAPSULE | ORAL | Status: DC

## 2015-01-07 NOTE — Progress Notes (Signed)
Subjective:    Patient ID: Danielle Parrish, female    DOB: [birth year], 74 y.o.   MRN: [id number]  Pt presents to the office for surgical clearance for left knee surgery. Pt has the following chronic conditions:  Hypertension This is a chronic problem. The current episode started more than 1 year ago. The problem is controlled. Pertinent negatives include no headaches. Risk factors for coronary artery disease include family history, obesity, post-menopausal state and sedentary lifestyle. Past treatments include calcium channel blockers, beta blockers, ACE inhibitors and diuretics. The current treatment provides moderate improvement. Compliance problems include diet and exercise.  Hypertensive end-organ damage includes a thyroid problem.  Hyperlipidemia This is a chronic problem. The current episode started more than 1 year ago. The problem is controlled. Recent lipid tests were reviewed and are high. She has no history of diabetes. Pertinent negatives include no myalgias. Current antihyperlipidemic treatment includes statins, nicotinic acid and fibric acid derivatives. The current treatment provides moderate improvement of lipids. Compliance problems include adherence to diet and adherence to exercise.  Risk factors for coronary artery disease include dyslipidemia, hypertension, obesity and post-menopausal.  Thyroid Problem Visit type: hypothyroidism. Patient reports no diarrhea or heat intolerance. The symptoms have been stable. Her past medical history is significant for hyperlipidemia. There is no history of diabetes.  Sleep apnea Uses CPAP nightly GAD/Depression Has been on zoloft  For awhile now- keeps her calm and from worrying all the time. anemia Daily iron supplements GERD Omeprazole daily- keeps symptoms under control Vitamin d def Vitamin d weekly  Review of Systems  Constitutional: Negative.   HENT: Negative.   Eyes: Negative.   Respiratory: Negative.   Cardiovascular:  Negative.   Gastrointestinal: Negative.  Negative for diarrhea.  Endocrine: Negative.  Negative for heat intolerance.  Genitourinary: Negative.   Musculoskeletal: Negative.  Negative for myalgias.  Neurological: Negative.  Negative for headaches.  Hematological: Negative.   Psychiatric/Behavioral: Negative.   All other systems reviewed and are negative.      Objective:   Physical Exam  Constitutional: She is oriented to person, place, and time. She appears well-developed and well-nourished. No distress.  HENT:  Head: Normocephalic and atraumatic.  Right Ear: External ear normal.  Mouth/Throat: Oropharynx is clear and moist.  Eyes: Pupils are equal, round, and reactive to light.  Neck: Normal range of motion. Neck supple. No thyromegaly present.  Cardiovascular: Normal rate, regular rhythm, normal heart sounds and intact distal pulses.   No murmur heard. Pulmonary/Chest: Effort normal and breath sounds normal. No respiratory distress. She has no wheezes.  Abdominal: Soft. Bowel sounds are normal. She exhibits no distension. There is no tenderness.  Musculoskeletal: Normal range of motion. She exhibits tenderness. She exhibits no edema.  Slow movements of left knee r/t to pain   Neurological: She is alert and oriented to person, place, and time. She has normal reflexes. No cranial nerve deficit.  Skin: Skin is warm and dry.  2cm raised flesh color mole on left upper abd wall.  Psychiatric: She has a normal mood and affect. Her behavior is normal. Judgment and thought content normal.  Vitals reviewed.   BP 136/65 mmHg  Pulse 56  Temp(Src) 97.8 F (36.6 C) (Oral)  Ht _0  (1.6 m)  Wt 182 lb (82.555 kg)  BMI 32.25 kg/m2     Assessment & Plan:  1. Essential hypertension Do not add slat to diet - amLODipine (NORVASC) 5 MG tablet; Take 1 tablet (5 mg total) by mouth  every morning.  Dispense: 90 tablet; Refill: 1 - atenolol (TENORMIN) 100 MG tablet; Take 1 tablet (100 mg  total) by mouth every evening.  Dispense: 90 tablet; Refill: 1 - benazepril (LOTENSIN) 40 MG tablet; Take 1 tablet (40 mg total) by mouth every evening.  Dispense: 90 tablet; Refill: 1 - CMP14+EGFR  2. Gastroesophageal reflux disease without esophagitis Avoid spicy foods Do not eat 2 hours prior to bedtime - omeprazole (PRILOSEC) 40 MG capsule; Take 1 capsule (40 mg total) by mouth every morning.  Dispense: 90 capsule; Refill: 1  3. Prediabetes Watch carbs in diet  4. Hypothyroidism, unspecified hypothyroidism type - levothyroxine (SYNTHROID, LEVOTHROID) 100 MCG tablet; Take 1 tablet (100 mcg total) by mouth daily before breakfast.  Dispense: 90 tablet; Refill: 1 - Thyroid Panel With TSH  5. Anxiety Stress management - sertraline (ZOLOFT) 100 MG tablet; Take 1 tablet (100 mg total) by mouth every morning.  Dispense: 90 tablet; Refill: 1  6. HLD (hyperlipidemia) Low fat diet - fenofibrate 160 MG tablet; TAKE 1 TABLET ONE TIME DAILY  Dispense: 90 tablet; Refill: 1 - lovastatin (MEVACOR) 40 MG tablet; Take 1 tablet (40 mg total) by mouth at bedtime.  Dispense: 90 tablet; Refill: 1 - NMR, lipoprofile  7. Obesity Discussed diet and exercise for person with BMI >25 Will recheck weight in 3-6 months   8. Vitamin D deficiency  - Vitamin D, Ergocalciferol, (DRISDOL) 50000 UNITS CAPS capsule; Take 1 capsule (50,000 Units total) by mouth every 7 (seven) days.  Dispense: 12 capsule; Refill: 1 - Vit D  25 hydroxy (rtn osteoporosis monitoring)  9. Arthritis - HYDROcodone-acetaminophen (NORCO) 7.5-325 MG per tablet; Take 1 tablet by mouth every 6 (six) hours as needed for moderate pain.  Dispense: 45 tablet; Refill: 0  10. Screening for osteoporosis Weight bearing exercises encouraged - DG Bone Density; Future  11. Anemia, unspecified anemia type - Ferrous Sulfate 90 (18 FE) MG TABS; Take 1 tablet by mouth every morning.  Dispense: 90 each; Refill: 1    Labs pending Health  maintenance reviewed Diet and exercise encouraged Continue all meds Follow up  In 3 month   [place], FNP

## 2015-01-07 NOTE — Telephone Encounter (Signed)
Danielle Parrish will come in 11-09-15 for labs to be drawn that were ordered on her 11-08-15 visit

## 2015-01-07 NOTE — Patient Instructions (Signed)

## 2015-01-07 NOTE — Telephone Encounter (Signed)
Stp advised she was to have labs drawn prior to leaving clinic, pt states she will come back tomorrow.

## 2015-01-08 ENCOUNTER — Other Ambulatory Visit: Payer: Medicare Other

## 2015-01-08 DIAGNOSIS — E785 Hyperlipidemia, unspecified: Secondary | ICD-10-CM | POA: Diagnosis not present

## 2015-01-08 DIAGNOSIS — I10 Essential (primary) hypertension: Secondary | ICD-10-CM | POA: Diagnosis not present

## 2015-01-08 DIAGNOSIS — E559 Vitamin D deficiency, unspecified: Secondary | ICD-10-CM | POA: Diagnosis not present

## 2015-01-08 DIAGNOSIS — E039 Hypothyroidism, unspecified: Secondary | ICD-10-CM | POA: Diagnosis not present

## 2015-01-08 NOTE — Progress Notes (Signed)
Labs only

## 2015-01-09 LAB — THYROID PANEL WITH TSH
Free Thyroxine Index: 2.4 (ref 1.2–4.9)
T3 Uptake Ratio: 27 % (ref 24–39)
T4, Total: 8.8 ug/dL (ref 4.5–12.0)
TSH: 1.43 u[IU]/mL (ref 0.450–4.500)

## 2015-01-09 LAB — CMP14+EGFR
ALT: 24 IU/L (ref 0–32)
AST: 21 IU/L (ref 0–40)
Albumin/Globulin Ratio: 2.1 (ref 1.1–2.5)
Albumin: 4.4 g/dL (ref 3.5–4.8)
Alkaline Phosphatase: 45 IU/L (ref 39–117)
BUN/Creatinine Ratio: 18 (ref 11–26)
BUN: 15 mg/dL (ref 8–27)
CO2: 27 mmol/L (ref 18–29)
Calcium: 9.8 mg/dL (ref 8.7–10.3)
Chloride: 98 mmol/L (ref 97–108)
Creatinine, Ser: 0.82 mg/dL (ref 0.57–1.00)
GFR calc Af Amer: 82 mL/min/{1.73_m2} (ref >59)
GFR calc non Af Amer: 71 mL/min/{1.73_m2} (ref >59)
Globulin, Total: 2.1 g/dL (ref 1.5–4.5)
Glucose: 103 mg/dL — ABNORMAL HIGH (ref 65–99)
Potassium: 5.1 mmol/L (ref 3.5–5.2)
Sodium: 140 mmol/L (ref 134–144)
Total Bilirubin: 0.4 mg/dL (ref 0.0–1.2)
Total Protein: 6.5 g/dL (ref 6.0–8.5)

## 2015-01-09 LAB — NMR, LIPOPROFILE
Cholesterol: 154 mg/dL (ref 100–199)
HDL Cholesterol by NMR: 47 mg/dL (ref >39)
HDL Particle Number: 38 umol/L (ref >=30.5)
LDL Particle Number: 1258 nmol/L — ABNORMAL HIGH (ref <1000)
LDL Size: 20.6 nm (ref >20.5)
LDL-C: 80 mg/dL (ref 0–99)
LP-IR Score: 70 — ABNORMAL HIGH (ref <=45)
Small LDL Particle Number: 828 nmol/L — ABNORMAL HIGH (ref <=527)
Triglycerides by NMR: 135 mg/dL (ref 0–149)

## 2015-01-09 LAB — VITAMIN D 25 HYDROXY (VIT D DEFICIENCY, FRACTURES): Vit D, 25-Hydroxy: 60.3 ng/mL (ref 30.0–100.0)

## 2015-01-11 ENCOUNTER — Telehealth: Payer: Self-pay | Admitting: Nurse Practitioner

## 2015-01-11 NOTE — Telephone Encounter (Signed)
Stp and she's aware that she needs to inform us when she uses multiple pharmacies for mulitple meds. Pt voiced understanding.

## 2015-02-10 ENCOUNTER — Other Ambulatory Visit: Payer: Self-pay | Admitting: Nurse Practitioner

## 2015-02-10 DIAGNOSIS — M1990 Unspecified osteoarthritis, unspecified site: Secondary | ICD-10-CM

## 2015-02-11 MED ORDER — HYDROCODONE-ACETAMINOPHEN 7.5-325 MG PO TABS: 1.0000 | ORAL_TABLET | Freq: Four times a day (QID) | ORAL | Status: DC | PRN

## 2015-02-11 NOTE — Telephone Encounter (Signed)
vicodin rx ready for pick up

## 2015-02-11 NOTE — Telephone Encounter (Signed)
Patient notified that rx up front and ready for pick up 

## 2015-03-09 ENCOUNTER — Other Ambulatory Visit: Payer: Self-pay | Admitting: Family

## 2015-03-10 ENCOUNTER — Telehealth: Payer: Self-pay

## 2015-03-10 ENCOUNTER — Other Ambulatory Visit: Payer: Self-pay | Admitting: Nurse Practitioner

## 2015-03-10 DIAGNOSIS — M1990 Unspecified osteoarthritis, unspecified site: Secondary | ICD-10-CM

## 2015-03-10 DIAGNOSIS — Z780 Asymptomatic menopausal state: Secondary | ICD-10-CM

## 2015-03-11 MED ORDER — HYDROCODONE-ACETAMINOPHEN 7.5-325 MG PO TABS: 1.0000 | ORAL_TABLET | Freq: Four times a day (QID) | ORAL | Status: DC | PRN

## 2015-03-11 NOTE — Telephone Encounter (Signed)
Pt aware.

## 2015-03-11 NOTE — Telephone Encounter (Signed)
Hydrocodone rx ready for pick up 

## 2015-03-15 ENCOUNTER — Other Ambulatory Visit: Payer: Medicare Other

## 2015-03-25 ENCOUNTER — Ambulatory Visit (INDEPENDENT_AMBULATORY_CARE_PROVIDER_SITE_OTHER): Payer: Medicare Other

## 2015-03-25 DIAGNOSIS — Z780 Asymptomatic menopausal state: Secondary | ICD-10-CM

## 2015-04-08 ENCOUNTER — Encounter: Payer: Self-pay | Admitting: Family

## 2015-04-08 ENCOUNTER — Ambulatory Visit (INDEPENDENT_AMBULATORY_CARE_PROVIDER_SITE_OTHER): Payer: Medicare Other | Admitting: Family

## 2015-04-08 VITALS — BP 134/78 | HR 54 | Temp 97.4°F | Ht 63.0 in | Wt 185.4 lb

## 2015-04-08 DIAGNOSIS — E559 Vitamin D deficiency, unspecified: Secondary | ICD-10-CM

## 2015-04-08 DIAGNOSIS — K219 Gastro-esophageal reflux disease without esophagitis: Secondary | ICD-10-CM | POA: Diagnosis not present

## 2015-04-08 DIAGNOSIS — E669 Obesity, unspecified: Secondary | ICD-10-CM

## 2015-04-08 DIAGNOSIS — F339 Major depressive disorder, recurrent, unspecified: Secondary | ICD-10-CM | POA: Insufficient documentation

## 2015-04-08 DIAGNOSIS — Z23 Encounter for immunization: Secondary | ICD-10-CM

## 2015-04-08 DIAGNOSIS — F329 Major depressive disorder, single episode, unspecified: Secondary | ICD-10-CM

## 2015-04-08 DIAGNOSIS — M1990 Unspecified osteoarthritis, unspecified site: Secondary | ICD-10-CM

## 2015-04-08 DIAGNOSIS — E039 Hypothyroidism, unspecified: Secondary | ICD-10-CM

## 2015-04-08 DIAGNOSIS — F32A Depression, unspecified: Secondary | ICD-10-CM

## 2015-04-08 DIAGNOSIS — E785 Hyperlipidemia, unspecified: Secondary | ICD-10-CM | POA: Diagnosis not present

## 2015-04-08 DIAGNOSIS — I10 Essential (primary) hypertension: Secondary | ICD-10-CM

## 2015-04-08 DIAGNOSIS — M129 Arthropathy, unspecified: Secondary | ICD-10-CM | POA: Diagnosis not present

## 2015-04-08 MED ORDER — FENOFIBRATE 160 MG PO TABS: ORAL_TABLET | ORAL | Status: DC

## 2015-04-08 NOTE — Addendum Note (Signed)
Addended by: Shelbie Ammons on: 04/08/2015 09:14 AM   Modules accepted: Orders

## 2015-04-08 NOTE — Patient Instructions (Signed)

## 2015-04-08 NOTE — Progress Notes (Signed)
Subjective:    Patient ID: Danielle Parrish, female    DOB: 1941/08/17, 74 y.o.   MRN: [id number] Pt presents to the office today for chronic follow up. Hyperlipidemia This is a chronic problem. The current episode started more than 1 year ago. The problem is controlled. Recent lipid tests were reviewed and are high. She has no history of diabetes. Pertinent negatives include no myalgias or shortness of breath. Current antihyperlipidemic treatment includes statins, nicotinic acid and fibric acid derivatives. The current treatment provides moderate improvement of lipids. Compliance problems include adherence to diet and adherence to exercise.  Risk factors for coronary artery disease include dyslipidemia, hypertension, obesity and post-menopausal.  Hypertension This is a chronic problem. The current episode started more than 1 year ago. The problem is controlled. Associated symptoms include anxiety. Pertinent negatives include no blurred vision, headaches, palpitations, peripheral edema or shortness of breath. Risk factors for coronary artery disease include family history, obesity, post-menopausal state and sedentary lifestyle. Past treatments include calcium channel blockers, beta blockers, ACE inhibitors and diuretics. The current treatment provides moderate improvement. Compliance problems include diet and exercise.  Hypertensive end-organ damage includes a thyroid problem. There is no history of kidney disease, CAD/MI, CVA or heart failure. Identifiable causes of hypertension include sleep apnea.  Thyroid Problem Presents for follow-up (hypothyroidism) visit. Patient reports no anxiety, depressed mood, diarrhea, fatigue, heat intolerance, nail problem or palpitations. The symptoms have been stable. Past treatments include levothyroxine. The treatment provided significant relief. Her past medical history is significant for hyperlipidemia. There is no history of diabetes or heart failure.   Anxiety Presents for follow-up visit. Patient reports no depressed mood, excessive worry, insomnia, nervous/anxious behavior, palpitations or shortness of breath. Symptoms occur rarely. The severity of symptoms is moderate. The quality of sleep is good.   Her past medical history is significant for anxiety/panic attacks and depression. Past treatments include SSRIs. Compliance with prior treatments has been good.      Review of Systems  Constitutional: Negative.  Negative for fatigue.  HENT: Negative.   Eyes: Negative.  Negative for blurred vision.  Respiratory: Negative.  Negative for shortness of breath.   Cardiovascular: Negative.  Negative for palpitations.  Gastrointestinal: Negative.  Negative for diarrhea.  Endocrine: Negative.  Negative for heat intolerance.  Genitourinary: Negative.   Musculoskeletal: Negative.  Negative for myalgias.  Neurological: Negative.  Negative for headaches.  Hematological: Negative.   Psychiatric/Behavioral: Negative.  The patient is not nervous/anxious and does not have insomnia.   All other systems reviewed and are negative.      Objective:   Physical Exam  Constitutional: She is oriented to person, place, and time. She appears well-developed and well-nourished. No distress.  HENT:  Head: Normocephalic and atraumatic.  Right Ear: External ear normal.  Left Ear: External ear normal.  Nose: Nose normal.  Mouth/Throat: Oropharynx is clear and moist.  Eyes: Pupils are equal, round, and reactive to light.  Neck: Normal range of motion. Neck supple. No thyromegaly present.  Cardiovascular: Normal rate, regular rhythm, normal heart sounds and intact distal pulses.   No murmur heard. Pulmonary/Chest: Effort normal and breath sounds normal. No respiratory distress. She has no wheezes.  Abdominal: Soft. Bowel sounds are normal. She exhibits no distension. There is no tenderness.  Musculoskeletal: Normal range of motion. She exhibits edema (Trace  amt in bilateral ankles). She exhibits no tenderness.  Neurological: She is alert and oriented to person, place, and time. She has normal reflexes. No cranial  nerve deficit.  Skin: Skin is warm and dry.  Psychiatric: She has a normal mood and affect. Her behavior is normal. Judgment and thought content normal.  Vitals reviewed.     BP 134/78 mmHg  Pulse 54  Temp(Src) 97.4 F (36.3 C) (Oral)  Ht _0  (1.6 m)  Wt 185 lb 6.4 oz (84.097 kg)  BMI 32.85 kg/m2     Assessment & Plan:  1. Essential hypertension - CMP14+EGFR  2. Gastroesophageal reflux disease without esophagitis - CMP14+EGFR  3. Hypothyroidism, unspecified hypothyroidism type - CMP14+EGFR - Thyroid Panel With TSH  4. Arthritis - CMP14+EGFR  5. HLD (hyperlipidemia) - CMP14+EGFR - Lipid panel - fenofibrate 160 MG tablet; TAKE 1 TABLET ONE TIME DAILY  Dispense: 90 tablet; Refill: 3  6. Obesity - CMP14+EGFR  7. Vitamin D deficiency - CMP14+EGFR - Vit D  25 hydroxy (rtn osteoporosis monitoring)  8. Depression - CMP14+EGFR   Continue all meds Labs pending Health Maintenance reviewed- Prevnar given today Diet and exercise encouraged RTO 3 months  Evelina Dun, FNP

## 2015-04-09 LAB — VITAMIN D 25 HYDROXY (VIT D DEFICIENCY, FRACTURES): Vit D, 25-Hydroxy: 44.9 ng/mL (ref 30.0–100.0)

## 2015-04-09 LAB — CMP14+EGFR
ALT: 41 IU/L — ABNORMAL HIGH (ref 0–32)
AST: 28 IU/L (ref 0–40)
Albumin/Globulin Ratio: 2 (ref 1.1–2.5)
Albumin: 4.5 g/dL (ref 3.5–4.8)
Alkaline Phosphatase: 59 IU/L (ref 39–117)
BUN/Creatinine Ratio: 20 (ref 11–26)
BUN: 16 mg/dL (ref 8–27)
Bilirubin Total: 0.3 mg/dL (ref 0.0–1.2)
CO2: 29 mmol/L (ref 18–29)
Calcium: 9.5 mg/dL (ref 8.7–10.3)
Chloride: 97 mmol/L (ref 97–108)
Creatinine, Ser: 0.82 mg/dL (ref 0.57–1.00)
GFR calc Af Amer: 82 mL/min/{1.73_m2} (ref >59)
GFR calc non Af Amer: 71 mL/min/{1.73_m2} (ref >59)
Globulin, Total: 2.2 g/dL (ref 1.5–4.5)
Glucose: 100 mg/dL — ABNORMAL HIGH (ref 65–99)
Potassium: 4.4 mmol/L (ref 3.5–5.2)
Sodium: 139 mmol/L (ref 134–144)
Total Protein: 6.7 g/dL (ref 6.0–8.5)

## 2015-04-09 LAB — LIPID PANEL
Chol/HDL Ratio: 3 ratio units (ref 0.0–4.4)
Cholesterol, Total: 149 mg/dL (ref 100–199)
HDL: 49 mg/dL (ref >39)
LDL Calculated: 72 mg/dL (ref 0–99)
Triglycerides: 138 mg/dL (ref 0–149)
VLDL Cholesterol Cal: 28 mg/dL (ref 5–40)

## 2015-04-09 LAB — THYROID PANEL WITH TSH
Free Thyroxine Index: 2.3 (ref 1.2–4.9)
T3 Uptake Ratio: 29 % (ref 24–39)
T4, Total: 8.1 ug/dL (ref 4.5–12.0)
TSH: 1.63 u[IU]/mL (ref 0.450–4.500)

## 2015-04-13 ENCOUNTER — Other Ambulatory Visit: Payer: Self-pay | Admitting: *Deleted

## 2015-04-13 DIAGNOSIS — M1990 Unspecified osteoarthritis, unspecified site: Secondary | ICD-10-CM

## 2015-04-13 MED ORDER — HYDROCODONE-ACETAMINOPHEN 7.5-325 MG PO TABS: 1.0000 | ORAL_TABLET | Freq: Four times a day (QID) | ORAL | Status: DC | PRN

## 2015-04-13 NOTE — Telephone Encounter (Signed)
Stp regarding test results, reviewed results and then pt is requesting refill on her hydrocodone. Last filled 3/17 #45.

## 2015-04-13 NOTE — Telephone Encounter (Signed)
-----   Message from Sharion Balloon, [place] sent at 04/12/2015 10:04 AM EDT ----- Kidney and liver function stable Cholesterol levels WNL Thyroid levels stable  Vit D levels WNL

## 2015-04-13 NOTE — Telephone Encounter (Signed)
Patient aware to come for hydrocodone script.

## 2015-04-13 NOTE — Telephone Encounter (Signed)
RX ready for pick up 

## 2015-05-04 DIAGNOSIS — H2513 Age-related nuclear cataract, bilateral: Secondary | ICD-10-CM | POA: Diagnosis not present

## 2015-05-19 ENCOUNTER — Other Ambulatory Visit: Payer: Self-pay | Admitting: Nurse Practitioner

## 2015-05-19 DIAGNOSIS — M1990 Unspecified osteoarthritis, unspecified site: Secondary | ICD-10-CM

## 2015-05-19 MED ORDER — HYDROCODONE-ACETAMINOPHEN 7.5-325 MG PO TABS: 1.0000 | ORAL_TABLET | Freq: Four times a day (QID) | ORAL | Status: DC | PRN

## 2015-05-19 NOTE — Telephone Encounter (Signed)
RX ready for pick up 

## 2015-05-20 NOTE — Telephone Encounter (Signed)
Aware,script for pain medication ready. 

## 2015-06-09 ENCOUNTER — Other Ambulatory Visit: Payer: Self-pay | Admitting: Family

## 2015-07-09 ENCOUNTER — Telehealth: Payer: Self-pay | Admitting: Family

## 2015-07-09 DIAGNOSIS — M1990 Unspecified osteoarthritis, unspecified site: Secondary | ICD-10-CM

## 2015-07-09 MED ORDER — HYDROCODONE-ACETAMINOPHEN 7.5-325 MG PO TABS: 1.0000 | ORAL_TABLET | Freq: Four times a day (QID) | ORAL | Status: DC | PRN

## 2015-07-09 NOTE — Telephone Encounter (Signed)
Pt notified Rx is ready for pick up.

## 2015-07-09 NOTE — Telephone Encounter (Signed)
RX ready for pick up 

## 2015-07-20 ENCOUNTER — Encounter: Payer: Self-pay | Admitting: Family Medicine

## 2015-07-20 ENCOUNTER — Ambulatory Visit (INDEPENDENT_AMBULATORY_CARE_PROVIDER_SITE_OTHER): Payer: Medicare Other | Admitting: Family Medicine

## 2015-07-20 VITALS — BP 88/54 | HR 66 | Temp 98.2°F | Ht 63.0 in | Wt 173.0 lb

## 2015-07-20 DIAGNOSIS — E871 Hypo-osmolality and hyponatremia: Secondary | ICD-10-CM

## 2015-07-20 DIAGNOSIS — I10 Essential (primary) hypertension: Secondary | ICD-10-CM | POA: Diagnosis not present

## 2015-07-20 DIAGNOSIS — M791 Myalgia, unspecified site: Secondary | ICD-10-CM

## 2015-07-20 LAB — POCT CBC
Granulocyte percent: 83.1 %G — AB (ref 37–80)
HCT, POC: 37.6 % — AB (ref 37.7–47.9)
Hemoglobin: 11.8 g/dL — AB (ref 12.2–16.2)
Lymph, poc: 1.3 (ref 0.6–3.4)
MCH, POC: 28.5 pg (ref 27–31.2)
MCHC: 31.5 g/dL — AB (ref 31.8–35.4)
MCV: 90.6 fL (ref 80–97)
MPV: 7.4 fL (ref 0–99.8)
POC Granulocyte: 9.5 — AB (ref 2–6.9)
POC LYMPH PERCENT: 11.7 %L (ref 10–50)
Platelet Count, POC: 318 10*3/uL (ref 142–424)
RBC: 4.15 M/uL (ref 4.04–5.48)
RDW, POC: 12.5 %
WBC: 11.4 10*3/uL — AB (ref 4.6–10.2)

## 2015-07-20 MED ORDER — ATENOLOL 50 MG PO TABS: 50.0000 mg | ORAL_TABLET | Freq: Every evening | ORAL | Status: DC

## 2015-07-20 MED ORDER — BENAZEPRIL HCL 20 MG PO TABS: 20.0000 mg | ORAL_TABLET | Freq: Every evening | ORAL | Status: DC

## 2015-07-20 MED ORDER — DOXYCYCLINE HYCLATE 100 MG PO TABS: 100.0000 mg | ORAL_TABLET | Freq: Two times a day (BID) | ORAL | Status: DC

## 2015-07-20 NOTE — Progress Notes (Signed)
Subjective:    Patient ID: Danielle Parrish, female    DOB: 09-23-1941, 74 y.o.   MRN: [id number]  HPI 74 year old female with a several week history of headache myalgias nausea. She has not eaten or drunk much in her weight has decreased by history. Also noted today that her blood pressure is much lower than normal. Review of her blood pressure meds includes Tenormin 100 mg, benazepril 40 mg, and hydrochlorothiazide 25 mg she has several reddened areas one on her left lower leg and one on her right lower quadrant that could be spiders or possibly tics. She denies any fever with the above symptoms.    Review of Systems  Constitutional: Positive for appetite change, fatigue and unexpected weight change.  Respiratory: Positive for cough.   Cardiovascular: Negative.   Gastrointestinal: Negative.   Neurological: Positive for headaches.  Psychiatric/Behavioral: Negative.    Patient Active Problem List   Diagnosis Date Noted  . Depression 04/08/2015  . Osteoarthritis of left knee 09/30/2014  . Vitamin D deficiency   . Anxiety   . GERD (gastroesophageal reflux disease)   . Arthritis   . Chronic pain of multiple joints   . HTN (hypertension) 05/15/2013  . HLD (hyperlipidemia) 05/15/2013  . Prediabetes 05/15/2013  . Obesity 05/15/2013  . Hypothyroidism 05/15/2013   Outpatient Encounter Prescriptions as of 07/20/2015  Medication Sig  . amLODipine (NORVASC) 5 MG tablet TAKE 1 TABLET ONE TIME DAILY  . atenolol (TENORMIN) 50 MG tablet Take 1 tablet (50 mg total) by mouth every evening.  . benazepril (LOTENSIN) 20 MG tablet Take 1 tablet (20 mg total) by mouth every evening.  . calcium-vitamin D (OSCAL WITH D) 500-200 MG-UNIT per tablet Take 1 tablet by mouth every morning.   . diphenhydrAMINE (BENADRYL) 25 MG tablet Take 25 mg by mouth 2 (two) times daily as needed for allergies.   . fenofibrate 160 MG tablet TAKE 1 TABLET ONE TIME DAILY  . Ferrous Sulfate 90 (18 FE) MG TABS Take 1 tablet by  mouth every morning.  . hydrochlorothiazide (HYDRODIURIL) 25 MG tablet Take 1 tablet (25 mg total) by mouth every morning.  Marland Kitchen HYDROcodone-acetaminophen (NORCO) 7.5-325 MG per tablet Take 1 tablet by mouth every 6 (six) hours as needed for moderate pain.  Marland Kitchen levothyroxine (SYNTHROID, LEVOTHROID) 100 MCG tablet Take 1 tablet (100 mcg total) by mouth daily before breakfast.  . lovastatin (MEVACOR) 40 MG tablet Take 1 tablet (40 mg total) by mouth at bedtime.  . Multiple Vitamin (MULTIVITAMIN) tablet Take 1 tablet by mouth every morning.   . niacin (SLO-NIACIN) 500 MG tablet Take 500 mg by mouth at bedtime.   Marland Kitchen omeprazole (PRILOSEC) 40 MG capsule Take 1 capsule (40 mg total) by mouth every morning.  . sertraline (ZOLOFT) 100 MG tablet Take 1 tablet (100 mg total) by mouth every morning.  . sodium chloride (OCEAN) 0.65 % SOLN nasal spray Place 1 spray into both nostrils as needed for congestion.  Marland Kitchen VITAMIN E PO Take 1 tablet by mouth every morning.  . [DISCONTINUED] atenolol (TENORMIN) 100 MG tablet Take 1 tablet (100 mg total) by mouth every evening.  . [DISCONTINUED] benazepril (LOTENSIN) 40 MG tablet Take 1 tablet (40 mg total) by mouth every evening.  Marland Kitchen doxycycline (VIBRA-TABS) 100 MG tablet Take 1 tablet (100 mg total) by mouth 2 (two) times daily.   No facility-administered encounter medications on file as of 07/20/2015.       Objective:   Physical Exam  Constitutional:  She is oriented to person, place, and time. She appears well-developed and well-nourished.  HENT:  Head: Normocephalic.  Frontal tenderness to percussion  Neck: Normal range of motion. Neck supple.  Cardiovascular: Normal rate and regular rhythm.   Pulmonary/Chest: Effort normal and breath sounds normal.  Neurological: She is alert and oriented to person, place, and time.  Psychiatric: She has a normal mood and affect.          Assessment & Plan:  1. Muscle pain I think the myalgias are a symptom of whatever is  going on with her. Considerations include sinus infection, tickborne illness. She has been on statin for many years so was thought likely that that's the cause of her muscle pain. Will provide Rx for doxycycline to treat possible sinus infection or tick fever - BMP8+EGFR - POCT CBC  2. Essential hypertension Since her blood pressure is low today I would like to cut in half doses of atenolol and benazepril to see if that perks her up a little - atenolol (TENORMIN) 50 MG tablet; Take 1 tablet (50 mg total) by mouth every evening.  Dispense: 90 tablet; Refill: 1 - benazepril (LOTENSIN) 20 MG tablet; Take 1 tablet (20 mg total) by mouth every evening.  Dispense: 90 tablet; Refill: 1  Wardell Honour MD

## 2015-07-20 NOTE — Patient Instructions (Signed)
You may receive a survey either by mail or email. Please take time to complete this as it helps us to serve you better.  Fall Prevention and Home Safety Falls cause injuries and can affect all age groups. It is possible to use preventive measures to significantly decrease the likelihood of falls. There are many simple measures which can make your home safer and prevent falls. OUTDOORS  Repair cracks and edges of walkways and driveways.  Remove high doorway thresholds.  Trim shrubbery on the main path into your home.  Have good outside lighting.  Clear walkways of tools, rocks, debris, and clutter.  Check that handrails are not broken and are securely fastened. Both sides of steps should have handrails.  Have leaves, snow, and ice cleared regularly.  Use sand or salt on walkways during winter months.  In the garage, clean up grease or oil spills. BATHROOM  Install night lights.  Install grab bars by the toilet and in the tub and shower.  Use non-skid mats or decals in the tub or shower.  Place a plastic non-slip stool in the shower to sit on, if needed.  Keep floors dry and clean up all water on the floor immediately.  Remove soap buildup in the tub or shower on a regular basis.  Secure bath mats with non-slip, double-sided rug tape.  Remove throw rugs and tripping hazards from the floors. BEDROOMS  Install night lights.  Make sure a bedside light is easy to reach.  Do not use oversized bedding.  Keep a telephone by your bedside.  Have a firm chair with side arms to use for getting dressed.  Remove throw rugs and tripping hazards from the floor. KITCHEN  Keep handles on pots and pans turned toward the center of the stove. Use back burners when possible.  Clean up spills quickly and allow time for drying.  Avoid walking on wet floors.  Avoid hot utensils and knives.  Position shelves so they are not too high or low.  Place commonly used objects within  easy reach.  If necessary, use a sturdy step stool with a grab bar when reaching.  Keep electrical cables out of the way.  Do not use floor polish or wax that makes floors slippery. If you must use wax, use non-skid floor wax.  Remove throw rugs and tripping hazards from the floor. STAIRWAYS  Never leave objects on stairs.  Place handrails on both sides of stairways and use them. Fix any loose handrails. Make sure handrails on both sides of the stairways are as long as the stairs.  Check carpeting to make sure it is firmly attached along stairs. Make repairs to worn or loose carpet promptly.  Avoid placing throw rugs at the top or bottom of stairways, or properly secure the rug with carpet tape to prevent slippage. Get rid of throw rugs, if possible.  Have an electrician put in a light switch at the top and bottom of the stairs. OTHER FALL PREVENTION TIPS  Wear low-heel or rubber-soled shoes that are supportive and fit well. Wear closed toe shoes.  When using a stepladder, make sure it is fully opened and both spreaders are firmly locked. Do not climb a closed stepladder.  Add color or contrast paint or tape to grab bars and handrails in your home. Place contrasting color strips on first and last steps.  Learn and use mobility aids as needed. Install an electrical emergency response system.  Turn on lights to avoid dark areas.   Replace light bulbs that burn out immediately. Get light switches that glow.  Arrange furniture to create clear pathways. Keep furniture in the same place.  Firmly attach carpet with non-skid or double-sided tape.  Eliminate uneven floor surfaces.  Select a carpet pattern that does not visually hide the edge of steps.  Be aware of all pets. OTHER HOME SAFETY TIPS  Set the water temperature for 120 F (48.8 C).  Keep emergency numbers on or near the telephone.  Keep smoke detectors on every level of the home and near sleeping areas. Document  Released: 12/01/2002 Document Revised: 06/11/2012 Document Reviewed: 03/01/2012 [employer] Patient Information 2015 [employer]. This information is not intended to replace advice given to you by your health care provider. Make sure you discuss any questions you have with your health care provider.  

## 2015-07-21 LAB — BMP8+EGFR
BUN/Creatinine Ratio: 26 (ref 11–26)
BUN: 23 mg/dL (ref 8–27)
CO2: 24 mmol/L (ref 18–29)
Calcium: 9.2 mg/dL (ref 8.7–10.3)
Chloride: 87 mmol/L — ABNORMAL LOW (ref 97–108)
Creatinine, Ser: 0.88 mg/dL (ref 0.57–1.00)
GFR calc Af Amer: 75 mL/min/{1.73_m2} (ref >59)
GFR calc non Af Amer: 65 mL/min/{1.73_m2} (ref >59)
Glucose: 139 mg/dL — ABNORMAL HIGH (ref 65–99)
Potassium: 4.1 mmol/L (ref 3.5–5.2)
Sodium: 130 mmol/L — ABNORMAL LOW (ref 134–144)

## 2015-07-22 NOTE — Addendum Note (Signed)
Addended by: Thana Ates on: 07/22/2015 04:48 PM   Modules accepted: Orders

## 2015-07-24 ENCOUNTER — Ambulatory Visit (INDEPENDENT_AMBULATORY_CARE_PROVIDER_SITE_OTHER): Payer: Medicare Other | Admitting: Family Medicine

## 2015-07-24 VITALS — BP 118/72 | HR 51 | Temp 97.4°F | Ht 63.0 in | Wt 169.0 lb

## 2015-07-24 DIAGNOSIS — I8312 Varicose veins of left lower extremity with inflammation: Secondary | ICD-10-CM | POA: Diagnosis not present

## 2015-07-24 NOTE — Progress Notes (Signed)
Subjective:  Patient ID: Danielle Parrish, female    DOB: 1941-08-18  Age: 74 y.o. MRN: [id number]  CC: left leg red and swollen and Nausea   HPI Danielle Parrish presents for recurrent edema. Minimal discomfort. Feels tight. Gradual worsening from trace edema about a week ago. Denies dydspnea. Energy is diminished. No chest pain or palpittations reported.  Nausea onset after starting the doxcycline a few days ago. Worse shortly after each dose.   History Danielle Parrish has a past medical history of Hypertension; Thyroid disease; Osteoporosis; Hyperlipidemia; Vitamin D deficiency; Anxiety; GERD (gastroesophageal reflux disease); Arthritis; Chronic pain of multiple joints; Sleep apnea; Hypothyroidism; and Depression.   She has past surgical history that includes Abdominal hernia repair; Abdominal hysterectomy (1977); and Knee arthroscopy (Left, 09/30/2014).   Her family history includes Cancer in her father; Hypertension in her mother; Stroke in her mother.She reports that she has never smoked. She has never used smokeless tobacco. She reports that she does not drink alcohol or use illicit drugs.  Current Outpatient Prescriptions on File Prior to Visit  Medication Sig Dispense Refill  . amLODipine (NORVASC) 5 MG tablet TAKE 1 TABLET ONE TIME DAILY 90 tablet 0  . atenolol (TENORMIN) 50 MG tablet Take 1 tablet (50 mg total) by mouth every evening. 90 tablet 1  . benazepril (LOTENSIN) 20 MG tablet Take 1 tablet (20 mg total) by mouth every evening. 90 tablet 1  . calcium-vitamin D (OSCAL WITH D) 500-200 MG-UNIT per tablet Take 1 tablet by mouth every morning.     . diphenhydrAMINE (BENADRYL) 25 MG tablet Take 25 mg by mouth 2 (two) times daily as needed for allergies.     Marland Kitchen doxycycline (VIBRA-TABS) 100 MG tablet Take 1 tablet (100 mg total) by mouth 2 (two) times daily. 20 tablet 0  . fenofibrate 160 MG tablet TAKE 1 TABLET ONE TIME DAILY 90 tablet 3  . Ferrous Sulfate 90 (18 FE) MG TABS Take 1 tablet by  mouth every morning. 90 each 1  . hydrochlorothiazide (HYDRODIURIL) 25 MG tablet Take 1 tablet (25 mg total) by mouth every morning. 30 tablet 0  . HYDROcodone-acetaminophen (NORCO) 7.5-325 MG per tablet Take 1 tablet by mouth every 6 (six) hours as needed for moderate pain. 45 tablet 0  . levothyroxine (SYNTHROID, LEVOTHROID) 100 MCG tablet Take 1 tablet (100 mcg total) by mouth daily before breakfast. 90 tablet 1  . lovastatin (MEVACOR) 40 MG tablet Take 1 tablet (40 mg total) by mouth at bedtime. 90 tablet 1  . Multiple Vitamin (MULTIVITAMIN) tablet Take 1 tablet by mouth every morning.     . niacin (SLO-NIACIN) 500 MG tablet Take 500 mg by mouth at bedtime.     Marland Kitchen omeprazole (PRILOSEC) 40 MG capsule Take 1 capsule (40 mg total) by mouth every morning. 90 capsule 1  . sertraline (ZOLOFT) 100 MG tablet Take 1 tablet (100 mg total) by mouth every morning. 90 tablet 1  . sodium chloride (OCEAN) 0.65 % SOLN nasal spray Place 1 spray into both nostrils as needed for congestion.    Marland Kitchen VITAMIN E PO Take 1 tablet by mouth every morning.     No current facility-administered medications on file prior to visit.    ROS Review of Systems  Constitutional: Negative for fever, chills, diaphoresis, appetite change, fatigue and unexpected weight change.  HENT: Negative for congestion, ear pain, hearing loss, postnasal drip, rhinorrhea, sneezing, sore throat and trouble swallowing.   Eyes: Negative for pain.  Respiratory:  Negative for cough, chest tightness and shortness of breath.   Cardiovascular: Negative for chest pain and palpitations.  Gastrointestinal: Negative for nausea, vomiting, abdominal pain, diarrhea and constipation.  Genitourinary: Negative for dysuria, frequency and menstrual problem.  Musculoskeletal: Negative for joint swelling and arthralgias.  Skin: Negative for rash.  Neurological: Negative for dizziness, weakness, numbness and headaches.  Psychiatric/Behavioral: Negative for dysphoric  mood and agitation.    Objective:  BP 118/72 mmHg  Pulse 51  Temp(Src) 97.4 F (36.3 C) (Oral)  Ht 5\' 3"  (1.6 m)  Wt 169 lb (76.658 kg)  BMI 29.94 kg/m2  Physical Exam  Constitutional: She is oriented to person, place, and time. She appears well-developed and well-nourished. No distress.  HENT:  Head: Normocephalic and atraumatic.  Right Ear: External ear normal.  Left Ear: External ear normal.  Nose: Nose normal.  Mouth/Throat: Oropharynx is clear and moist.  Eyes: Conjunctivae and EOM are normal. Pupils are equal, round, and reactive to light.  Neck: Normal range of motion. Neck supple. No thyromegaly present.  Cardiovascular: Normal rate, regular rhythm, normal heart sounds and intact distal pulses.   No murmur heard. Pulmonary/Chest: Effort normal and breath sounds normal. No respiratory distress. She has no wheezes. She has no rales.  Abdominal: Soft. Bowel sounds are normal. She exhibits no distension. There is no tenderness.  Lymphadenopathy:    She has no cervical adenopathy.  Neurological: She is alert and oriented to person, place, and time. She has normal reflexes.  Skin: Skin is warm and dry. There is erythema (left shin from ankle to 1/2 way to knee.).  Psychiatric: She has a normal mood and affect. Her behavior is normal. Judgment and thought content normal.    Assessment & Plan:   Danielle Parrish was seen today for left leg red and swollen and nausea.  Diagnoses and all orders for this visit:  Stasis dermatitis, acute, left  Other orders -     betamethasone acetate-betamethasone sodium phosphate (CELESTONE) injection 6 mg; Inject 1 mL (6 mg total) into the muscle once.   I am having Danielle Parrish maintain her multivitamin, niacin, calcium-vitamin D, diphenhydrAMINE, sodium chloride, VITAMIN E PO, hydrochlorothiazide, Ferrous Sulfate, levothyroxine, lovastatin, omeprazole, sertraline, fenofibrate, amLODipine, HYDROcodone-acetaminophen, doxycycline, atenolol, and  benazepril.  No orders of the defined types were placed in this encounter.   Monitor nauasea. If worsens, may need to switch antibiotic.  Follow-up: Return in about 2 weeks (around 08/07/2015).  Claretta Fraise, M.D.

## 2015-07-25 MED ORDER — BETAMETHASONE SOD PHOS & ACET 6 (3-3) MG/ML IJ SUSP
6.0000 mg | Freq: Once | INTRAMUSCULAR | Status: AC
  Administered 2015-07-24: 6 mg via INTRAMUSCULAR

## 2015-07-28 ENCOUNTER — Other Ambulatory Visit (INDEPENDENT_AMBULATORY_CARE_PROVIDER_SITE_OTHER): Payer: Medicare Other

## 2015-07-28 ENCOUNTER — Telehealth: Payer: Self-pay | Admitting: *Deleted

## 2015-07-28 ENCOUNTER — Ambulatory Visit: Payer: Medicare Other | Admitting: Family

## 2015-07-28 DIAGNOSIS — E871 Hypo-osmolality and hyponatremia: Secondary | ICD-10-CM | POA: Diagnosis not present

## 2015-07-28 NOTE — Telephone Encounter (Signed)
Patient came in for labs and wanted to speak with triage about her legs. She is being treated for venous stasis dermatitis and complains that her legs are burning. She has been applying Neosporin to the areas. The rash has improved but remains mildly erythemic. There are no breaks in the skin. Advised to d/c neosporin and to apply hydrocortisone cream instead. Suggested polysporin instead of neosporin for future antibiotic ointment use.  Patient will follow up if symptoms worsen or persist.

## 2015-07-28 NOTE — Progress Notes (Signed)
Lab only 

## 2015-07-29 LAB — BMP8+EGFR
BUN/Creatinine Ratio: 29 — ABNORMAL HIGH (ref 11–26)
BUN: 22 mg/dL (ref 8–27)
CO2: 27 mmol/L (ref 18–29)
Calcium: 10.6 mg/dL — ABNORMAL HIGH (ref 8.7–10.3)
Chloride: 92 mmol/L — ABNORMAL LOW (ref 97–108)
Creatinine, Ser: 0.77 mg/dL (ref 0.57–1.00)
GFR calc Af Amer: 88 mL/min/{1.73_m2} (ref >59)
GFR calc non Af Amer: 76 mL/min/{1.73_m2} (ref >59)
Glucose: 103 mg/dL — ABNORMAL HIGH (ref 65–99)
Potassium: 4.7 mmol/L (ref 3.5–5.2)
Sodium: 134 mmol/L (ref 134–144)

## 2015-07-30 ENCOUNTER — Telehealth: Payer: Self-pay | Admitting: Family Medicine

## 2015-07-30 NOTE — Telephone Encounter (Signed)
Her calcium is borderline high. Everything else looks good. There are some marginal numbers that do not appear significant. Overall she should be just fine.

## 2015-07-30 NOTE — Telephone Encounter (Signed)
Patient is calling requesting her lab results and Dr. Sabra Heck is out of the office will you please review her labs

## 2015-07-30 NOTE — Telephone Encounter (Signed)
Pt notified of results Verbalizes understanding She did say she continues to have nausea Please advise

## 2015-08-02 NOTE — Telephone Encounter (Signed)
Patient aware of labs and states she is weak and tired and wants to be seen with Sabra Heck this week, appointment given for Wednesday @ 4:00.

## 2015-08-04 ENCOUNTER — Ambulatory Visit (INDEPENDENT_AMBULATORY_CARE_PROVIDER_SITE_OTHER): Payer: Medicare Other | Admitting: Family Medicine

## 2015-08-04 ENCOUNTER — Encounter: Payer: Self-pay | Admitting: Family Medicine

## 2015-08-04 VITALS — BP 120/74 | HR 73 | Temp 97.5°F | Ht 63.0 in | Wt 163.0 lb

## 2015-08-04 DIAGNOSIS — R5383 Other fatigue: Secondary | ICD-10-CM | POA: Insufficient documentation

## 2015-08-04 DIAGNOSIS — R1314 Dysphagia, pharyngoesophageal phase: Secondary | ICD-10-CM | POA: Diagnosis not present

## 2015-08-04 DIAGNOSIS — R5381 Other malaise: Secondary | ICD-10-CM | POA: Diagnosis not present

## 2015-08-04 DIAGNOSIS — R634 Abnormal weight loss: Secondary | ICD-10-CM | POA: Diagnosis not present

## 2015-08-04 DIAGNOSIS — R630 Anorexia: Secondary | ICD-10-CM | POA: Diagnosis not present

## 2015-08-04 NOTE — Progress Notes (Signed)
Subjective:    Patient ID: Danielle Parrish, female    DOB: Mar 25, 1941, 74 y.o.   MRN: [id number]  HPI 74 year old female with anorexia and weight loss malaise and fatigue and dysphagia. She also relates some problems with her balance and several recent falls but denies memory loss or true incontinence. She was found to be hyponatremic initially and was also treated for sinusitis with doxycycline. Her headache resolved but the above symptoms persist. She has now lost weight in excess of 20 pounds over the past 3 months.  She denies specific pain but does have a sensation of food sticking in her esophagus and really has to make herself eat so she has anorexia  Patient Active Problem List   Diagnosis Date Noted  . Depression 04/08/2015  . Osteoarthritis of left knee 09/30/2014  . Vitamin D deficiency   . Anxiety   . GERD (gastroesophageal reflux disease)   . Arthritis   . Chronic pain of multiple joints   . HTN (hypertension) 05/15/2013  . HLD (hyperlipidemia) 05/15/2013  . Prediabetes 05/15/2013  . Obesity 05/15/2013  . Hypothyroidism 05/15/2013   Outpatient Encounter Prescriptions as of 08/04/2015  Medication Sig  . amLODipine (NORVASC) 5 MG tablet TAKE 1 TABLET ONE TIME DAILY  . atenolol (TENORMIN) 50 MG tablet Take 1 tablet (50 mg total) by mouth every evening.  . benazepril (LOTENSIN) 20 MG tablet Take 1 tablet (20 mg total) by mouth every evening.  . diphenhydrAMINE (BENADRYL) 25 MG tablet Take 25 mg by mouth 2 (two) times daily as needed for allergies.   Marland Kitchen doxycycline (VIBRA-TABS) 100 MG tablet Take 1 tablet (100 mg total) by mouth 2 (two) times daily.  . fenofibrate 160 MG tablet TAKE 1 TABLET ONE TIME DAILY  . Ferrous Sulfate 90 (18 FE) MG TABS Take 1 tablet by mouth every morning.  . hydrochlorothiazide (HYDRODIURIL) 25 MG tablet Take 1 tablet (25 mg total) by mouth every morning.  Marland Kitchen HYDROcodone-acetaminophen (NORCO) 7.5-325 MG per tablet Take 1 tablet by mouth every 6 (six)  hours as needed for moderate pain.  Marland Kitchen levothyroxine (SYNTHROID, LEVOTHROID) 100 MCG tablet Take 1 tablet (100 mcg total) by mouth daily before breakfast.  . lovastatin (MEVACOR) 40 MG tablet Take 1 tablet (40 mg total) by mouth at bedtime.  Marland Kitchen omeprazole (PRILOSEC) 40 MG capsule Take 1 capsule (40 mg total) by mouth every morning.  . sertraline (ZOLOFT) 100 MG tablet Take 1 tablet (100 mg total) by mouth every morning.  . sodium chloride (OCEAN) 0.65 % SOLN nasal spray Place 1 spray into both nostrils as needed for congestion.  Marland Kitchen VITAMIN E PO Take 1 tablet by mouth every morning.  . calcium-vitamin D (OSCAL WITH D) 500-200 MG-UNIT per tablet Take 1 tablet by mouth every morning.   . Multiple Vitamin (MULTIVITAMIN) tablet Take 1 tablet by mouth every morning.   . niacin (SLO-NIACIN) 500 MG tablet Take 500 mg by mouth at bedtime.    No facility-administered encounter medications on file as of 08/04/2015.      Review of Systems  Constitutional: Positive for appetite change and unexpected weight change.  HENT: Negative.   Respiratory: Negative.   Cardiovascular: Negative.   Gastrointestinal: Negative.   Genitourinary: Negative.   Neurological: Negative.   Psychiatric/Behavioral: Negative.        Objective:   Physical Exam  Constitutional: She is oriented to person, place, and time. She appears well-developed and well-nourished.  HENT:  Head: Normocephalic.  Eyes: Pupils are  equal, round, and reactive to light.  Neck: Normal range of motion.  Cardiovascular: Normal rate and regular rhythm.   Abdominal: Soft. Bowel sounds are normal. She exhibits no mass. There is no tenderness.  Musculoskeletal: Normal range of motion.  Neurological: She is alert and oriented to person, place, and time. She displays normal reflexes. Coordination normal.  Psychiatric: She has a normal mood and affect. Her behavior is normal.          Assessment & Plan:  1. Loss of weight With this constellation  of symptoms I am really concerned that there may be some underlying pathology especially possibility of malignancy exists - CMP14+EGFR - POCT UA - Microscopic Only - CBC with Differential/Platelet - Sedimentation rate  2. Dysphagia, pharyngoesophageal phase She is not having any excessive salivation or vomiting but possibility of esophageal stricture or mass exists - CBC with Differential/Platelet - Sedimentation rate  3. Anorexia This just goes along with her other symptoms of weight loss repeated labs today in addition sedimentation rate - CBC with Differential/Platelet - Sedimentation rate  4. Malaise and fatigue Hyponatremia may have contributed to previously and I think we need to look it sodium again as well as other labs  Wardell Honour MD - CBC with Differential/Platelet - Sedimentation rate

## 2015-08-05 ENCOUNTER — Telehealth: Payer: Self-pay | Admitting: Family Medicine

## 2015-08-05 ENCOUNTER — Other Ambulatory Visit: Payer: Medicare Other

## 2015-08-05 LAB — CBC WITH DIFFERENTIAL/PLATELET
Basophils Absolute: 0 10*3/uL (ref 0.0–0.2)
Basos: 1 %
EOS (ABSOLUTE): 0 10*3/uL (ref 0.0–0.4)
Eos: 1 %
Hematocrit: 37.1 % (ref 34.0–46.6)
Hemoglobin: 12.3 g/dL (ref 11.1–15.9)
Immature Grans (Abs): 0 10*3/uL (ref 0.0–0.1)
Immature Granulocytes: 0 %
Lymphocytes Absolute: 1.6 10*3/uL (ref 0.7–3.1)
Lymphs: 24 %
MCH: 29.7 pg (ref 26.6–33.0)
MCHC: 33.2 g/dL (ref 31.5–35.7)
MCV: 90 fL (ref 79–97)
Monocytes Absolute: 0.8 10*3/uL (ref 0.1–0.9)
Monocytes: 12 %
Neutrophils Absolute: 4.1 10*3/uL (ref 1.4–7.0)
Neutrophils: 62 %
Platelets: 278 10*3/uL (ref 150–379)
RBC: 4.14 x10E6/uL (ref 3.77–5.28)
RDW: 12.9 % (ref 12.3–15.4)
WBC: 6.5 10*3/uL (ref 3.4–10.8)

## 2015-08-05 LAB — CMP14+EGFR
ALT: 16 IU/L (ref 0–32)
AST: 24 IU/L (ref 0–40)
Albumin/Globulin Ratio: 1.1 (ref 1.1–2.5)
Albumin: 3.5 g/dL (ref 3.5–4.8)
Alkaline Phosphatase: 75 IU/L (ref 39–117)
BUN/Creatinine Ratio: 24 (ref 11–26)
BUN: 19 mg/dL (ref 8–27)
Bilirubin Total: 0.3 mg/dL (ref 0.0–1.2)
CO2: 31 mmol/L — ABNORMAL HIGH (ref 18–29)
Calcium: 9.9 mg/dL (ref 8.7–10.3)
Chloride: 93 mmol/L — ABNORMAL LOW (ref 97–108)
Creatinine, Ser: 0.79 mg/dL (ref 0.57–1.00)
GFR calc Af Amer: 85 mL/min/{1.73_m2} (ref >59)
GFR calc non Af Amer: 74 mL/min/{1.73_m2} (ref >59)
Globulin, Total: 3.3 g/dL (ref 1.5–4.5)
Glucose: 112 mg/dL — ABNORMAL HIGH (ref 65–99)
Potassium: 4.4 mmol/L (ref 3.5–5.2)
Sodium: 134 mmol/L (ref 134–144)
Total Protein: 6.8 g/dL (ref 6.0–8.5)

## 2015-08-05 LAB — POCT UA - MICROSCOPIC ONLY
Casts, Ur, LPF, POC: NEGATIVE
Mucus, UA: NEGATIVE
Yeast, UA: NEGATIVE

## 2015-08-05 LAB — SEDIMENTATION RATE: Sed Rate: 27 mm/hr (ref 0–40)

## 2015-08-05 NOTE — Addendum Note (Signed)
Addended by: Ilean China on: 08/05/2015 10:53 AM   Modules accepted: Orders

## 2015-08-05 NOTE — Telephone Encounter (Signed)
Patient states that she has been diagnosed with CHF in the past and she wants to know if you think any of her problems could be coming from this.

## 2015-08-06 NOTE — Telephone Encounter (Signed)
Does not have sx of CHF

## 2015-08-09 ENCOUNTER — Other Ambulatory Visit: Payer: Self-pay | Admitting: *Deleted

## 2015-08-09 ENCOUNTER — Other Ambulatory Visit: Payer: Medicare Other

## 2015-08-09 DIAGNOSIS — R8281 Pyuria: Secondary | ICD-10-CM

## 2015-08-09 NOTE — Telephone Encounter (Signed)
Patient has appointment with Dr. Sabra Heck tomorrow nurse already spoke with patient this morning.

## 2015-08-10 ENCOUNTER — Encounter: Payer: Self-pay | Admitting: Family Medicine

## 2015-08-10 ENCOUNTER — Ambulatory Visit (INDEPENDENT_AMBULATORY_CARE_PROVIDER_SITE_OTHER): Payer: Medicare Other | Admitting: Family Medicine

## 2015-08-10 VITALS — BP 113/66 | HR 75 | Temp 98.1°F | Ht 63.0 in | Wt 163.0 lb

## 2015-08-10 DIAGNOSIS — R609 Edema, unspecified: Secondary | ICD-10-CM

## 2015-08-10 DIAGNOSIS — M1990 Unspecified osteoarthritis, unspecified site: Secondary | ICD-10-CM

## 2015-08-10 MED ORDER — HYDROCODONE-ACETAMINOPHEN 7.5-325 MG PO TABS: 1.0000 | ORAL_TABLET | Freq: Four times a day (QID) | ORAL | Status: DC | PRN

## 2015-08-10 MED ORDER — FUROSEMIDE 20 MG PO TABS: 20.0000 mg | ORAL_TABLET | Freq: Every day | ORAL | Status: DC | PRN

## 2015-08-10 NOTE — Progress Notes (Signed)
Subjective:    Patient ID: Danielle Parrish, female    DOB: Sep 27, 1941, 74 y.o.   MRN: [id number]  HPI 74 year old female who been following for weight loss fatigue anorexia. She presents today with some lower extremity edema. Review of her lab work for the last several weeks she has normal renal function and normal protein and albumen. She tells me there is a remote history of heart failure but that has not been a problem in recent years. She denies shortness of breath or chest pain. She does have moderately severe varicosities in her lower legs and is likely that venous disease contributes to this edema.  Patient Active Problem List   Diagnosis Date Noted  . Loss of weight 08/04/2015  . Dysphagia, pharyngoesophageal phase 08/04/2015  . Anorexia 08/04/2015  . Fatigue 08/04/2015  . Malaise and fatigue 08/04/2015  . Depression 04/08/2015  . Osteoarthritis of left knee 09/30/2014  . Vitamin D deficiency   . Anxiety   . GERD (gastroesophageal reflux disease)   . Arthritis   . Chronic pain of multiple joints   . HTN (hypertension) 05/15/2013  . HLD (hyperlipidemia) 05/15/2013  . Prediabetes 05/15/2013  . Obesity 05/15/2013  . Hypothyroidism 05/15/2013   Outpatient Encounter Prescriptions as of 08/10/2015  Medication Sig  . amLODipine (NORVASC) 5 MG tablet TAKE 1 TABLET ONE TIME DAILY  . calcium-vitamin D (OSCAL WITH D) 500-200 MG-UNIT per tablet Take 1 tablet by mouth every morning.   . diphenhydrAMINE (BENADRYL) 25 MG tablet Take 25 mg by mouth 2 (two) times daily as needed for allergies.   . Ferrous Sulfate 90 (18 FE) MG TABS Take 1 tablet by mouth every morning.  . hydrochlorothiazide (HYDRODIURIL) 25 MG tablet Take 1 tablet (25 mg total) by mouth every morning.  Marland Kitchen HYDROcodone-acetaminophen (NORCO) 7.5-325 MG per tablet Take 1 tablet by mouth every 6 (six) hours as needed for moderate pain.  Marland Kitchen levothyroxine (SYNTHROID, LEVOTHROID) 100 MCG tablet Take 1 tablet (100 mcg total) by mouth  daily before breakfast.  . Multiple Vitamin (MULTIVITAMIN) tablet Take 1 tablet by mouth every morning.   . niacin (SLO-NIACIN) 500 MG tablet Take 500 mg by mouth at bedtime.   Marland Kitchen omeprazole (PRILOSEC) 40 MG capsule Take 1 capsule (40 mg total) by mouth every morning.  . sertraline (ZOLOFT) 100 MG tablet Take 1 tablet (100 mg total) by mouth every morning.  . sodium chloride (OCEAN) 0.65 % SOLN nasal spray Place 1 spray into both nostrils as needed for congestion.  Marland Kitchen VITAMIN E PO Take 1 tablet by mouth every morning.  . [DISCONTINUED] doxycycline (VIBRA-TABS) 100 MG tablet Take 1 tablet (100 mg total) by mouth 2 (two) times daily.  . [DISCONTINUED] atenolol (TENORMIN) 50 MG tablet Take 1 tablet (50 mg total) by mouth every evening.  . [DISCONTINUED] benazepril (LOTENSIN) 20 MG tablet Take 1 tablet (20 mg total) by mouth every evening.  . [DISCONTINUED] fenofibrate 160 MG tablet TAKE 1 TABLET ONE TIME DAILY  . [DISCONTINUED] lovastatin (MEVACOR) 40 MG tablet Take 1 tablet (40 mg total) by mouth at bedtime.   No facility-administered encounter medications on file as of 08/10/2015.       Review of Systems  Constitutional: Positive for unexpected weight change.  Respiratory: Negative.   Cardiovascular: Positive for leg swelling.  Musculoskeletal: Positive for back pain and arthralgias.  Neurological: Negative.   Psychiatric/Behavioral: Negative.        Objective:   Physical Exam  Constitutional: She appears well-developed and  well-nourished.  Cardiovascular: Normal rate and normal heart sounds.   Pulmonary/Chest: Effort normal and breath sounds normal.  Musculoskeletal: She exhibits edema.  I would estimate edema 2+ as morning. She states that it will be worse tonight but does typically resolve with overnight bedrest    BP 113/66 mmHg  Pulse 75  Temp(Src) 98.1 F (36.7 C) (Oral)  Ht 5\' 3"  (1.6 m)  Wt 163 lb (73.936 kg)  BMI 28.88 kg/m2       Assessment & Plan:  1.  Arthritis There has been some consideration for knee replacement patient does not feel like that is warranted as yet - HYDROcodone-acetaminophen (NORCO) 7.5-325 MG per tablet; Take 1 tablet by mouth every 6 (six) hours as needed for moderate pain.  Dispense: 45 tablet; Refill: 0  2. Edema Certain as to etiology of edema at. At this point I think venous stasis disease is most likely calls will provide Lasix 20 mg to take 2 or 3 days a week. On days she does not take Lasix continue with HCTZ  Wardell Honour MD

## 2015-08-12 ENCOUNTER — Telehealth: Payer: Self-pay | Admitting: Family Medicine

## 2015-08-12 NOTE — Telephone Encounter (Signed)
Is she using hydrocodone qid?

## 2015-08-12 NOTE — Telephone Encounter (Signed)
Complains of bilateral leg and ankle pain. States that it feels like a "charlie horse." Legs aren't very swollen this morning but she did go ahead and take Lasix. She didn't take it yesterday. Her pain medication hasn't helped either. Advised her to elevate her legs and rest. I will forward her message to Dr Sabra Heck for advice.

## 2015-08-13 ENCOUNTER — Telehealth: Payer: Self-pay | Admitting: Family Medicine

## 2015-08-13 ENCOUNTER — Other Ambulatory Visit: Payer: Self-pay | Admitting: Gastroenterology

## 2015-08-13 DIAGNOSIS — R1314 Dysphagia, pharyngoesophageal phase: Secondary | ICD-10-CM | POA: Diagnosis not present

## 2015-08-13 DIAGNOSIS — R634 Abnormal weight loss: Secondary | ICD-10-CM | POA: Diagnosis not present

## 2015-08-13 DIAGNOSIS — R1310 Dysphagia, unspecified: Secondary | ICD-10-CM

## 2015-08-13 NOTE — Telephone Encounter (Signed)
Patient hasn't been taking them qid. Asked her to try and see if that helps. She was worried about constipation. Advised to continue metamucil with plenty of water and to take a stool softener as well.  She has a follow up with Dr Sabra Heck early next week and will call back sooner if needed.

## 2015-08-13 NOTE — Telephone Encounter (Signed)
This was taken care of in a different encounter.

## 2015-08-16 ENCOUNTER — Ambulatory Visit
Admission: RE | Admit: 2015-08-16 | Discharge: 2015-08-16 | Disposition: A | Discharge status: Home / Self Care | Payer: Medicare Other | Source: Ambulatory Visit | Attending: Gastroenterology | Admitting: Gastroenterology

## 2015-08-16 ENCOUNTER — Other Ambulatory Visit: Payer: Medicare Other

## 2015-08-16 DIAGNOSIS — K224 Dyskinesia of esophagus: Secondary | ICD-10-CM | POA: Diagnosis not present

## 2015-08-16 DIAGNOSIS — R1310 Dysphagia, unspecified: Secondary | ICD-10-CM

## 2015-08-17 ENCOUNTER — Encounter: Payer: Self-pay | Admitting: Family Medicine

## 2015-08-17 ENCOUNTER — Ambulatory Visit: Payer: Medicare Other

## 2015-08-17 ENCOUNTER — Ambulatory Visit (INDEPENDENT_AMBULATORY_CARE_PROVIDER_SITE_OTHER): Payer: Medicare Other

## 2015-08-17 ENCOUNTER — Ambulatory Visit (INDEPENDENT_AMBULATORY_CARE_PROVIDER_SITE_OTHER): Payer: Medicare Other | Admitting: Family Medicine

## 2015-08-17 VITALS — BP 125/83 | HR 82 | Temp 97.3°F | Ht 63.0 in | Wt 164.6 lb

## 2015-08-17 DIAGNOSIS — M79672 Pain in left foot: Secondary | ICD-10-CM

## 2015-08-17 DIAGNOSIS — M79671 Pain in right foot: Secondary | ICD-10-CM | POA: Diagnosis not present

## 2015-08-17 DIAGNOSIS — I10 Essential (primary) hypertension: Secondary | ICD-10-CM | POA: Diagnosis not present

## 2015-08-17 NOTE — Progress Notes (Signed)
Subjective:    Patient ID: Danielle Parrish, female    DOB: [birth year], 74 y.o.   MRN: [id number]  HPI 74 year old female with multiple symptoms but no clear-cut diagnosis. She has been seen multiple times in the last 6 or 8 weeks with various symptoms including anorexia weight loss fatigue and weakness dysphagia. She saw a gastroenterologist last week who ordered barium swallow. Apparently this showed no obstruction or stricture. Generally, I think she feels better. Today she is complaining of some right lower quadrant pain and this is a new symptom. She is being more constipated since she takes more hydrocodone for her joint pain.  Patient Active Problem List   Diagnosis Date Noted  . Loss of weight 08/04/2015  . Dysphagia, pharyngoesophageal phase 08/04/2015  . Anorexia 08/04/2015  . Fatigue 08/04/2015  . Malaise and fatigue 08/04/2015  . Depression 04/08/2015  . Osteoarthritis of left knee 09/30/2014  . Vitamin D deficiency   . Anxiety   . GERD (gastroesophageal reflux disease)   . Arthritis   . Chronic pain of multiple joints   . HTN (hypertension) 05/15/2013  . HLD (hyperlipidemia) 05/15/2013  . Prediabetes 05/15/2013  . Obesity 05/15/2013  . Hypothyroidism 05/15/2013   Outpatient Encounter Prescriptions as of 08/17/2015  Medication Sig  . amLODipine (NORVASC) 5 MG tablet TAKE 1 TABLET ONE TIME DAILY  . Ferrous Sulfate 90 (18 FE) MG TABS Take 1 tablet by mouth every morning.  . furosemide (LASIX) 20 MG tablet Take 1 tablet (20 mg total) by mouth daily as needed.  Marland Kitchen HYDROcodone-acetaminophen (NORCO) 7.5-325 MG per tablet Take 1 tablet by mouth every 6 (six) hours as needed for moderate pain.  Marland Kitchen levothyroxine (SYNTHROID, LEVOTHROID) 100 MCG tablet Take 1 tablet (100 mcg total) by mouth daily before breakfast.  . Multiple Vitamin (MULTIVITAMIN) tablet Take 1 tablet by mouth every morning.   Marland Kitchen omeprazole (PRILOSEC) 40 MG capsule Take 1 capsule (40 mg total) by mouth every morning.   . sertraline (ZOLOFT) 100 MG tablet Take 1 tablet (100 mg total) by mouth every morning.  . sodium chloride (OCEAN) 0.65 % SOLN nasal spray Place 1 spray into both nostrils as needed for congestion.  . [DISCONTINUED] calcium-vitamin D (OSCAL WITH D) 500-200 MG-UNIT per tablet Take 1 tablet by mouth every morning.   . [DISCONTINUED] diphenhydrAMINE (BENADRYL) 25 MG tablet Take 25 mg by mouth 2 (two) times daily as needed for allergies.   . [DISCONTINUED] hydrochlorothiazide (HYDRODIURIL) 25 MG tablet Take 1 tablet (25 mg total) by mouth every morning.  . [DISCONTINUED] niacin (SLO-NIACIN) 500 MG tablet Take 500 mg by mouth at bedtime.   . [DISCONTINUED] VITAMIN E PO Take 1 tablet by mouth every morning.   No facility-administered encounter medications on file as of 08/17/2015.      Review of Systems  Constitutional: Positive for fatigue.  HENT: Negative.   Respiratory: Negative.   Cardiovascular: Negative.   Gastrointestinal: Positive for abdominal pain.  Psychiatric/Behavioral: Negative.        Objective:   Physical Exam  Constitutional: She appears well-developed and well-nourished.  Weight is up 2 pounds from last visit  HENT:  Head: Normocephalic.  Cardiovascular: Normal rate and regular rhythm.   Pulmonary/Chest: Effort normal and breath sounds normal.  Abdominal:  Bruising noted in right lower quadrant. Abdomen Contour is lumpy but I can feel no definite mass.  Musculoskeletal:  Pain in left heel hurts when she will stands on it. That raises suspicion of heel spur. X-ray  pending  There are bilateral heel spurs left greater than right.          Assessment & Plan:  1. Essential hypertension Blood pressure is good today at 125/83 continue with amlodipine and furosemide - DG Foot Complete Left; Future - DG Foot Complete Right; Future  2. Bilateral foot pain Bilateral spurring. I have recommended a Tuli cup to be used in her shoes. If this does not resolve symptoms  consider steroid-injection - DG Foot Complete Left; Future - DG Foot Complete Right; Future  Wardell Honour MD

## 2015-08-25 ENCOUNTER — Telehealth: Payer: Self-pay | Admitting: Family Medicine

## 2015-08-25 NOTE — Telephone Encounter (Signed)
Patient complains of constant epigastric pain that is worse with deep inhalation.  She has taken Tums and double up on her omeprazole. This has not helped. She was last seen by Dr Sabra Heck on 08/17/15. I would like to schedule with him tomorrow for continuity but he did not have any available appts. Appointment scheduled with Dr Wendi Snipes. She was reluctant to schedule with a new provider but reassured her that Dr Sabra Heck would be available for consultation if necessary.  Patient stated understanding and agreement to plan.

## 2015-08-26 ENCOUNTER — Ambulatory Visit (INDEPENDENT_AMBULATORY_CARE_PROVIDER_SITE_OTHER): Payer: Medicare Other | Admitting: Family Medicine

## 2015-08-26 ENCOUNTER — Encounter: Payer: Self-pay | Admitting: Family Medicine

## 2015-08-26 VITALS — BP 131/72 | HR 69 | Temp 97.1°F | Ht 63.0 in | Wt 160.6 lb

## 2015-08-26 DIAGNOSIS — R1013 Epigastric pain: Secondary | ICD-10-CM | POA: Insufficient documentation

## 2015-08-26 NOTE — Progress Notes (Signed)
   HPI  Patient presents today for epigastric pain evaluation  Patient explains dull achy midepigastric abdominal pain for the last 3-4 weeks. She states that the pain is worse with taking deep breaths. She has some nausea but no vomiting. She's lost her appetite but does tolerate food and fluids normally.  She's tried times, double dose of PPI, and her usual Vicodin dose which has not helped her pain.  She states that the pain is constant and intermittently worse with deep inspiration. Has no association of food. She has had chills.  She seen Dr. Amedeo Plenty at [facility], he's done a barium swallow which was normal.  PMH: Smoking status noted ROS: Per HPI  Objective: BP 131/72 mmHg  Pulse 69  Temp(Src) 97.1 F (36.2 C) (Oral)  Ht _0  (1.6 m)  Wt 160 lb 9.6 oz (72.848 kg)  BMI 28.46 kg/m2 Gen: NAD, alert, cooperative with exam HEENT: NCAT CV: RRR, good S1/S2, no murmur Resp: CTABL, no wheezes, non-labored Abd: Soft, tenderness to palpation right upper quadrant and mid epigastric area, moderate diastases recti, voluntary guarding, positive bowel sounds Ext: No edema, warm Neuro: Alert and oriented, No gross deficits  Assessment and plan:  # Epigastric pain 3-4 weeks of epigastric pain and 25 pounds weight loss reported from the patient, 20 kg in her chart over the last 4 months, considering her duration of pain and weight loss of think it's a good idea to go ahead and pursue imaging. Also checking lipase, CMP Barium swallow was normal at GI, request records CT ordered, with contrast Reviewed reasons for return in detail   Orders Placed This Encounter  Procedures  . CT Abdomen Pelvis W Contrast    Standing Status: Future     Number of Occurrences:      Standing Expiration Date: 11/24/2016    Order Specific Question:  Reason for Exam (SYMPTOM  OR DIAGNOSIS REQUIRED)    Answer:  epigastric pain 3-4 week swith weigh tloss    Order Specific Question:  Preferred imaging location?     Answer:  [facility]  . Lipase  . CBC with Differential    No orders of the defined types were placed in this encounter.    Laroy Apple, MD [facility] 08/26/2015, 8:49 AM

## 2015-08-26 NOTE — Patient Instructions (Signed)
Great to meet you!  We will call you with you results as soon as we can, this can take up to a week.   If you develop worsening symptoms, if you're unable to tolerate food or fluids by mouth, or if you develop fevers please get medical help right away

## 2015-08-27 LAB — CMP14+EGFR
ALT: 12 IU/L (ref 0–32)
AST: 13 IU/L (ref 0–40)
Albumin/Globulin Ratio: 1.3 (ref 1.1–2.5)
Albumin: 3.6 g/dL (ref 3.5–4.8)
Alkaline Phosphatase: 107 IU/L (ref 39–117)
BUN/Creatinine Ratio: 15 (ref 11–26)
BUN: 9 mg/dL (ref 8–27)
Bilirubin Total: 0.2 mg/dL (ref 0.0–1.2)
CO2: 25 mmol/L (ref 18–29)
Calcium: 9.5 mg/dL (ref 8.7–10.3)
Chloride: 95 mmol/L — ABNORMAL LOW (ref 97–108)
Creatinine, Ser: 0.62 mg/dL (ref 0.57–1.00)
GFR calc Af Amer: 103 mL/min/{1.73_m2} (ref >59)
GFR calc non Af Amer: 89 mL/min/{1.73_m2} (ref >59)
Globulin, Total: 2.8 g/dL (ref 1.5–4.5)
Glucose: 102 mg/dL — ABNORMAL HIGH (ref 65–99)
Potassium: 4.2 mmol/L (ref 3.5–5.2)
Sodium: 138 mmol/L (ref 134–144)
Total Protein: 6.4 g/dL (ref 6.0–8.5)

## 2015-08-27 LAB — CBC WITH DIFFERENTIAL/PLATELET
Basophils Absolute: 0 10*3/uL (ref 0.0–0.2)
Basos: 0 %
EOS (ABSOLUTE): 0.1 10*3/uL (ref 0.0–0.4)
Eos: 1 %
Hematocrit: 35.3 % (ref 34.0–46.6)
Hemoglobin: 11.2 g/dL (ref 11.1–15.9)
Immature Grans (Abs): 0 10*3/uL (ref 0.0–0.1)
Immature Granulocytes: 0 %
Lymphocytes Absolute: 1.4 10*3/uL (ref 0.7–3.1)
Lymphs: 20 %
MCH: 28.6 pg (ref 26.6–33.0)
MCHC: 31.7 g/dL (ref 31.5–35.7)
MCV: 90 fL (ref 79–97)
Monocytes Absolute: 0.7 10*3/uL (ref 0.1–0.9)
Monocytes: 11 %
Neutrophils Absolute: 4.5 10*3/uL (ref 1.4–7.0)
Neutrophils: 68 %
Platelets: 434 10*3/uL — ABNORMAL HIGH (ref 150–379)
RBC: 3.91 x10E6/uL (ref 3.77–5.28)
RDW: 13.5 % (ref 12.3–15.4)
WBC: 6.8 10*3/uL (ref 3.4–10.8)

## 2015-08-27 LAB — LIPASE: Lipase: 30 U/L (ref 0–59)

## 2015-08-31 ENCOUNTER — Other Ambulatory Visit: Payer: Self-pay | Admitting: Family Medicine

## 2015-08-31 DIAGNOSIS — M1990 Unspecified osteoarthritis, unspecified site: Secondary | ICD-10-CM

## 2015-08-31 NOTE — Telephone Encounter (Signed)
Last filled 08/10/15 for #45. Quantity is not lasting a month. Last seen 08/17/15

## 2015-09-01 ENCOUNTER — Ambulatory Visit (HOSPITAL_COMMUNITY)
Admission: RE | Admit: 2015-09-01 | Discharge: 2015-09-01 | Disposition: A | Discharge status: Home / Self Care | Payer: Medicare Other | Source: Ambulatory Visit | Attending: Family Medicine | Admitting: Family Medicine

## 2015-09-01 DIAGNOSIS — R634 Abnormal weight loss: Secondary | ICD-10-CM | POA: Diagnosis not present

## 2015-09-01 DIAGNOSIS — R1013 Epigastric pain: Secondary | ICD-10-CM | POA: Insufficient documentation

## 2015-09-01 DIAGNOSIS — R933 Abnormal findings on diagnostic imaging of other parts of digestive tract: Secondary | ICD-10-CM | POA: Diagnosis not present

## 2015-09-01 DIAGNOSIS — R110 Nausea: Secondary | ICD-10-CM | POA: Insufficient documentation

## 2015-09-01 MED ORDER — IOHEXOL 300 MG/ML  SOLN
100.0000 mL | Freq: Once | INTRAMUSCULAR | Status: AC | PRN
  Administered 2015-09-01: 100 mL via INTRAVENOUS

## 2015-09-01 MED ORDER — HYDROCODONE-ACETAMINOPHEN 7.5-325 MG PO TABS: 1.0000 | ORAL_TABLET | Freq: Four times a day (QID) | ORAL | Status: DC | PRN

## 2015-09-02 ENCOUNTER — Telehealth: Payer: Self-pay | Admitting: Family Medicine

## 2015-09-02 DIAGNOSIS — R0601 Orthopnea: Secondary | ICD-10-CM | POA: Insufficient documentation

## 2015-09-02 NOTE — Telephone Encounter (Signed)
Patient aware that script is ready for pickup

## 2015-09-02 NOTE — Telephone Encounter (Signed)
Called to discuss CT results.   It showed Trace pleural effusions, some free fluid, transient signs of gastritis. I doubt she is having gastritis for this duration. She has Hx of Grade 1 diastolic dysfunction and complains of worsening orthopnea on the phone. Eval with Echo  Consider CT chest if no improvement and no reason for pleural effusion can be found.  Continue lasix She is sending results of a sleep study, she needs Cpap ordered.   Laroy Apple, MD [facility] 09/02/2015, 1:09 PM

## 2015-09-03 ENCOUNTER — Other Ambulatory Visit: Payer: Self-pay | Admitting: *Deleted

## 2015-09-03 ENCOUNTER — Encounter: Payer: Self-pay | Admitting: Family Medicine

## 2015-09-03 ENCOUNTER — Ambulatory Visit (INDEPENDENT_AMBULATORY_CARE_PROVIDER_SITE_OTHER): Payer: Medicare Other | Admitting: Family Medicine

## 2015-09-03 VITALS — BP 118/70 | HR 76 | Temp 97.4°F | Ht 63.0 in | Wt 163.0 lb

## 2015-09-03 DIAGNOSIS — F419 Anxiety disorder, unspecified: Secondary | ICD-10-CM

## 2015-09-03 DIAGNOSIS — R1013 Epigastric pain: Secondary | ICD-10-CM

## 2015-09-03 DIAGNOSIS — R1031 Right lower quadrant pain: Secondary | ICD-10-CM

## 2015-09-03 DIAGNOSIS — E039 Hypothyroidism, unspecified: Secondary | ICD-10-CM

## 2015-09-03 MED ORDER — SERTRALINE HCL 100 MG PO TABS: 100.0000 mg | ORAL_TABLET | ORAL | Status: DC

## 2015-09-03 MED ORDER — AMLODIPINE BESYLATE 5 MG PO TABS: 5.0000 mg | ORAL_TABLET | Freq: Every day | ORAL | Status: DC

## 2015-09-03 MED ORDER — CIPROFLOXACIN HCL 500 MG PO TABS: 500.0000 mg | ORAL_TABLET | Freq: Two times a day (BID) | ORAL | Status: DC

## 2015-09-03 MED ORDER — METRONIDAZOLE 500 MG PO TABS: 500.0000 mg | ORAL_TABLET | Freq: Two times a day (BID) | ORAL | Status: DC

## 2015-09-03 MED ORDER — LEVOTHYROXINE SODIUM 100 MCG PO TABS: 100.0000 ug | ORAL_TABLET | Freq: Every day | ORAL | Status: DC

## 2015-09-03 NOTE — Patient Instructions (Signed)
Great to see you!  If you develop fevers, chills, bloody stools or worsening symptoms please dont hesitate to call us or seek emergency care.    SEEK IMMEDIATE MEDICAL CARE IF:   You develop chills.  You have an oral temperature above 102 F (38.9 C), not controlled by medicine.  You have extreme weakness, fainting, or dehydration.  You have repeated vomiting.  You develop severe belly (abdominal) pain or are passing bloody or tarry stools.

## 2015-09-03 NOTE — Progress Notes (Signed)
   HPI  Patient presents today for continued abdominal pain  Patient explains that she's had 3-4 weeks now of midepigastric and right lower quadrant pain. She states that the right lower quadrant pain is been present but much worse over the last few days.  She's had no other changes in her pain. She still denies fevers, chills, sweats, or food or fluid intolerance.  She states that the pain is sharp and continuous. She has no aggravating or alleviating factors. She states that her appetite is less but that she still tolerating at least one good meal daily. She states that she had bacon and For breakfast and that she did not have any worsening pain after that. She also states that she's been working hard cleaning her house today. She states her pain is an 8 out of 10. She has Norco that she uses chronically, this does not help the pain.  PMH: Smoking status noted ROS: Per HPI  Objective: BP 118/70 mmHg  Pulse 76  Temp(Src) 97.4 F (36.3 C) (Oral)  Ht 5\' 3"  (1.6 m)  Wt 163 lb (73.936 kg)  BMI 28.88 kg/m2 Gen: NAD, alert, cooperative with exam HEENT: NCAT CV: RRR, good S1/S2, no murmur Resp: CTABL, no wheezes, non-labored Abd: Soft, slight tenderness to palpation of the midepigastric area, no Murphy sign, no tenderness to palpation of right lower quadrant, very deep palpation with no tenderness, no guarding or rebound Ext: No edema, warm Neuro: Alert and oriented, No gross deficits  Assessment and plan:  # Abdominal pain Now 4 weeks of abdominal pain, abdominal exam is largely benign. She does have some tenderness in the midepigastric area. Her CT was reviewed in detail, she has some free fluid in the abdomen but no concerns for appendicitis or ovarian pathology. She has a transient appearance of possible gastritis. She states that she does not have a history of diverticula, she has had several polyps. At this time given the free fluid in her continued pain I think it's at least  reasonable to treat her for infectious colitis with Cipro and Flagyl. Red flags for return or seeking emergent medical care were reviewed.   Meds ordered this encounter  Medications  . ciprofloxacin (CIPRO) 500 MG tablet    Sig: Take 1 tablet (500 mg total) by mouth 2 (two) times daily.    Dispense:  20 tablet    Refill:  0  . metroNIDAZOLE (FLAGYL) 500 MG tablet    Sig: Take 1 tablet (500 mg total) by mouth 2 (two) times daily.    Dispense:  20 tablet    Refill:  0    Laroy Apple, MD [facility] 09/03/2015, 5:41 PM

## 2015-09-04 ENCOUNTER — Telehealth: Payer: Self-pay | Admitting: Family Medicine

## 2015-09-04 MED ORDER — METRONIDAZOLE 500 MG PO TABS: 500.0000 mg | ORAL_TABLET | Freq: Two times a day (BID) | ORAL | Status: DC

## 2015-09-04 NOTE — Telephone Encounter (Signed)
Re-sent to correct pharm

## 2015-09-09 ENCOUNTER — Ambulatory Visit (HOSPITAL_COMMUNITY)
Admission: RE | Admit: 2015-09-09 | Discharge: 2015-09-09 | Disposition: A | Discharge status: Home / Self Care | Payer: Medicare Other | Source: Ambulatory Visit | Attending: Family Medicine | Admitting: Family Medicine

## 2015-09-09 DIAGNOSIS — I10 Essential (primary) hypertension: Secondary | ICD-10-CM | POA: Insufficient documentation

## 2015-09-09 DIAGNOSIS — E785 Hyperlipidemia, unspecified: Secondary | ICD-10-CM | POA: Insufficient documentation

## 2015-09-09 DIAGNOSIS — R0601 Orthopnea: Secondary | ICD-10-CM | POA: Insufficient documentation

## 2015-09-10 ENCOUNTER — Telehealth: Payer: Self-pay | Admitting: Family Medicine

## 2015-09-10 ENCOUNTER — Encounter: Payer: Self-pay | Admitting: Family Medicine

## 2015-09-10 DIAGNOSIS — I5032 Chronic diastolic (congestive) heart failure: Secondary | ICD-10-CM | POA: Insufficient documentation

## 2015-09-10 NOTE — Telephone Encounter (Signed)
Pt calling to discuss echo results. Grade 2 diastolic dysfunction, unlikely contributing to pain.   Possibly her pain is pleuritic chect pain with effusions and worsening with deep breath.   She is tsill hurting but reports low heart rates to the 50s at home. Now having less energy.   Recommended close f.u next week to look at bradycardia and to discus her abd pain.   Laroy Apple, MD [facility] 09/10/2015, 4:45 PM

## 2015-09-10 NOTE — Telephone Encounter (Signed)
I called and left a voicemail, I was going to discuss her recent echocardiogram results.  She does not have any major abnormalities but does have grade 2 diastolic dysfunction. This is dysfunction in the way to the heart relaxes and fills with blood and is not a very serious finding. It is not likely the cause of any of her symptoms.  I will ask nursing to call and ask her to follow up she is still having abdominal pain.Laroy Apple, MD [facility] 09/10/2015, 9:13 AM

## 2015-09-14 ENCOUNTER — Ambulatory Visit (INDEPENDENT_AMBULATORY_CARE_PROVIDER_SITE_OTHER): Payer: Medicare Other | Admitting: Family Medicine

## 2015-09-14 ENCOUNTER — Encounter: Payer: Self-pay | Admitting: Family Medicine

## 2015-09-14 ENCOUNTER — Ambulatory Visit (INDEPENDENT_AMBULATORY_CARE_PROVIDER_SITE_OTHER): Payer: Medicare Other

## 2015-09-14 VITALS — BP 143/68 | HR 52 | Temp 98.4°F | Ht 63.0 in | Wt 160.2 lb

## 2015-09-14 DIAGNOSIS — R1013 Epigastric pain: Secondary | ICD-10-CM | POA: Diagnosis not present

## 2015-09-14 DIAGNOSIS — G4733 Obstructive sleep apnea (adult) (pediatric): Secondary | ICD-10-CM | POA: Insufficient documentation

## 2015-09-14 DIAGNOSIS — R001 Bradycardia, unspecified: Secondary | ICD-10-CM | POA: Diagnosis not present

## 2015-09-14 DIAGNOSIS — J90 Pleural effusion, not elsewhere classified: Secondary | ICD-10-CM

## 2015-09-14 MED ORDER — FLUCONAZOLE 150 MG PO TABS: 150.0000 mg | ORAL_TABLET | Freq: Once | ORAL | Status: DC

## 2015-09-14 NOTE — Progress Notes (Signed)
   HPI  Patient presents today for eval of continued abd pain  She explains that her abdominal pain is predominantly epigastric that is worsening with deep inspiration. She also has right lower quadrant pain. Been going on now for 6-8 weeks.  In the interval she has had an echocardiogram to evaluate for orthopnea with pleural effusions, she was found to have grade 2 diastolic dysfunction and trace mitral and tricuspid regurgitation In the last 2-3 weeks she has developed generalized fatigue and states that she's measured her heart rate to be in the upper 40s but most often in the low 50s.  She denies any syncope, dizziness, or near syncope. She denies dyspnea or chest pain.  She has a follow-up appointment coming up with her GI doctor.  PMH: Smoking status noted ROS: Per HPI  Objective: BP 143/68 mmHg  Pulse 52  Temp(Src) 98.4 F (36.9 C) (Oral)  Ht 5\' 3"  (1.6 m)  Wt 160 lb 3.2 oz (72.666 kg)  BMI 28.39 kg/m2 Gen: NAD, alert, cooperative with exam HEENT: NCAT CV: Bradycardia, regular rhythm, no murmur Resp: CTABL, no wheezes, non-labored Abd: Mild tenderness to palpation of bilateral upper quadrants and epigastric area, otherwise soft and nontender, no guarding, positive bowel sounds Ext: No edema, warm Neuro: Alert and oriented, No gross deficits  EKG 09/14/2015: Sinus bradycardia, poor R-wave progression, no ischemic changes. Computer reads old anterior infarct, however cannot appreciate any changes consistent with this in V3 or V4.   DG chest- L sided pleural effusion, no infiltrate  Assessment and plan:  # Epigastric abdominal pain Continued, unimproved Workup so far including CT scan negative for discrete etiology Consider referred pleuritic chest pain with worsening on deep inspiration, however this seems unlikely. Follow-up with GI coming up,will ask them to review my workup so far  # Pleural effusion, trace Chest x-ray with stable trace pleural effusions from  her CAT scan. Grade 2 diastolic dysfunction likely does not explain these Unclear etiology  # Bradycardia EKG today shows sinus bradycardia Besides fatigue she is not asymptomatic, I do not see medications that would cause this bradycardia Refer to cardiology  # OSA Reviewed records from 2004 sleep study with sever OSA orderefd CPAP Sleep study reviewed-  that was performed on 10/14/2003, she has severe obstructive sleep apnea with an apnea-hypopnea index of 103.8 and oxygen desaturation to 83% Recommended CT Pressure of 13 cm of water Simplicity small mask   Orders Placed This Encounter  Procedures  . DG Chest 2 View    Standing Status: Future     Number of Occurrences:      Standing Expiration Date: 11/13/2016    Order Specific Question:  Reason for Exam (SYMPTOM  OR DIAGNOSIS REQUIRED)    Answer:  pleural effusion on CT a few weeks ago, pleuritic pain    Order Specific Question:  Preferred imaging location?    Answer:  Internal  . EKG 12-Lead    Laroy Apple, MD [facility] 09/14/2015, 8:57 AM

## 2015-09-14 NOTE — Patient Instructions (Signed)
Great to see you today!  You will called by cardiology to schedule an appointment, There is no heart block or other reason I see for your low heart rate (good news!)  Please follow up with Dr. Amedeo Plenty like we discussed  Lets see you back in 4-6 weeks to to follow up on your abd pain.    Bradycardia Bradycardia is a term for a heart rate (pulse) that, in adults, is slower than 60 beats per minute. A normal rate is 60 to 100 beats per minute. A heart rate below 60 beats per minute may be normal for some adults with healthy hearts. If the rate is too slow, the heart may have trouble pumping the volume of blood the body needs. If the heart rate gets too low, blood flow to the brain may be decreased and may make you feel lightheaded, dizzy, or faint. The heart has a natural pacemaker in the top of the heart called the SA node (sinoatrial or sinus node). This pacemaker sends out regular electrical signals to the muscle of the heart, telling the heart muscle when to beat (contract). The electrical signal travels from the upper parts of the heart (atria) through the AV node (atrioventricular node), to the lower chambers of the heart (ventricles). The ventricles squeeze, pumping the blood from your heart to your lungs and to the rest of your body. CAUSES   Problem with the heart's electrical system.  Problem with the heart's natural pacemaker.  Heart disease, damage, or infection.  Medications.  Problems with minerals and salts (electrolytes). SYMPTOMS   Fainting (syncope).  Fatigue and weakness.  Shortness of breath (dyspnea).  Chest pain (angina).  Drowsiness.  Confusion. DIAGNOSIS   An electrocardiogram (ECG) can help your caregiver determine the type of slow heart rate you have.  If the cause is not seen on an ECG, you may need to wear a heart monitor that records your heart rhythm for several hours or days.  Blood tests. TREATMENT   Electrolyte  supplements.  Medications.  Withholding medication which is causing a slow heart rate.  Pacemaker placement. SEEK IMMEDIATE MEDICAL CARE IF:   You feel lightheaded or faint.  You develop an irregular heart rate.  You feel chest pain or have trouble breathing. MAKE SURE YOU:   Understand these instructions.  Will watch your condition.  Will get help right away if you are not doing well or get worse. Document Released: 09/02/2002 Document Revised: 03/04/2012 Document Reviewed: 03/18/2014 [facility] Patient Information 2015 [place], [place]. This information is not intended to replace advice given to you by your health care provider. Make sure you discuss any questions you have with your health care provider.

## 2015-09-16 ENCOUNTER — Other Ambulatory Visit: Payer: Self-pay

## 2015-09-16 ENCOUNTER — Telehealth: Payer: Self-pay | Admitting: Family Medicine

## 2015-09-16 DIAGNOSIS — Z1231 Encounter for screening mammogram for malignant neoplasm of breast: Secondary | ICD-10-CM

## 2015-09-16 NOTE — Telephone Encounter (Signed)
Patient aware of results.

## 2015-09-17 ENCOUNTER — Ambulatory Visit
Admission: RE | Admit: 2015-09-17 | Discharge: 2015-09-17 | Disposition: A | Discharge status: Home / Self Care | Payer: Medicare Other | Source: Ambulatory Visit

## 2015-09-17 DIAGNOSIS — Z1231 Encounter for screening mammogram for malignant neoplasm of breast: Secondary | ICD-10-CM

## 2015-09-21 ENCOUNTER — Telehealth: Payer: Self-pay | Admitting: Family Medicine

## 2015-09-21 MED ORDER — FLUCONAZOLE 150 MG PO TABS: 150.0000 mg | ORAL_TABLET | Freq: Once | ORAL | Status: DC

## 2015-09-21 NOTE — Telephone Encounter (Signed)
Refilled diflucan, decline norco Rx as it is a controlled substance and she should be seen before a refill.   Laroy Apple, MD [facility] 09/21/2015, 12:14 PM

## 2015-09-23 ENCOUNTER — Encounter: Payer: Self-pay | Admitting: Family Medicine

## 2015-09-23 ENCOUNTER — Ambulatory Visit (INDEPENDENT_AMBULATORY_CARE_PROVIDER_SITE_OTHER): Payer: Medicare Other | Admitting: Family Medicine

## 2015-09-23 VITALS — BP 114/73 | HR 54 | Temp 97.5°F | Wt 158.0 lb

## 2015-09-23 DIAGNOSIS — Z23 Encounter for immunization: Secondary | ICD-10-CM

## 2015-09-23 DIAGNOSIS — M1990 Unspecified osteoarthritis, unspecified site: Secondary | ICD-10-CM

## 2015-09-23 MED ORDER — HYDROCODONE-ACETAMINOPHEN 7.5-325 MG PO TABS: 1.0000 | ORAL_TABLET | Freq: Four times a day (QID) | ORAL | Status: DC | PRN

## 2015-09-23 MED ORDER — FLUCONAZOLE 150 MG PO TABS: 150.0000 mg | ORAL_TABLET | Freq: Once | ORAL | Status: DC

## 2015-09-23 NOTE — Progress Notes (Signed)
   HPI  Patient presents today for follow-up abdominal pain  Has continued abdominal pain, described as sharp intermittent left upper quadrant and right lower quadrant pain worse with cough and deep inspiration. It lasts a few moments and then goes away. She states that the pain is so severe that she is started using more hydrocodone.  She denies any dyspnea. She has had a left-sided sharp chest pain a few days ago originating from her left breast to her left back, it was not exertional and remained present for approximately 2 days when it resolved on its end. He had no aggravating or alleviating factors. It was not accompanied by tachycardia, dyspnea, or cough.  PMH: Smoking status noted ROS: Per HPI  Objective: BP 114/73 mmHg  Pulse 54  Temp(Src) 97.5 F (36.4 C) (Oral)  Wt 158 lb (71.668 kg) Gen: NAD, alert, cooperative with exam HEENT: NCAT CV: RRR, good S1/S2, no murmur Resp: CTABL, no wheezes, non-labored Abd: SNTND, BS present, no guarding or organomegaly Ext:RLE with 1+ pitting edema, calf size 35 cm symmetric Neuro: Alert and oriented, No gross deficits  Assessment and plan:  # Abdominal pain Persistent, unclear etiology Workup included CT abdomen showed mild nonspecific free fluid in the pelvis, small pleural effusions, no acute findings. Follow-up echocardiogram showed grade 2 diastolic dysfunction She states that she's had a small bowel follow-through with GI which was normal. She has a GI appointment on October 10 She's been giving hydrocodone as a temporizing treatment while her workup is completed, at this point it's very difficult to say what the next appropriate right step. She has several nonspecific findings with no unifying diagnosis. Appreciate GI assistance   Orders Placed This Encounter  Procedures  . Flu Vaccine QUAD 36+ mos IM    Meds ordered this encounter  Medications  . HYDROcodone-acetaminophen (NORCO) 7.5-325 MG tablet    Sig: Take 1  tablet by mouth every 6 (six) hours as needed for moderate pain.    Dispense:  45 tablet    Refill:  0  . fluconazole (DIFLUCAN) 150 MG tablet    Sig: Take 1 tablet (150 mg total) by mouth once. And repeat in 3 days    Dispense:  2 tablet    Refill:  [place], MD [facility] 09/23/2015, 11:23 AM

## 2015-09-23 NOTE — Patient Instructions (Signed)
Great to see you!  We will need to see you before another refill for hydrocodone because this medicine is temporary.   Be sure to see Dr. Amedeo Plenty and have him send [place] his records

## 2015-09-27 ENCOUNTER — Telehealth: Payer: Self-pay | Admitting: Family Medicine

## 2015-09-29 ENCOUNTER — Ambulatory Visit (INDEPENDENT_AMBULATORY_CARE_PROVIDER_SITE_OTHER): Payer: Medicare Other | Admitting: Cardiology

## 2015-09-29 ENCOUNTER — Encounter: Payer: Self-pay | Admitting: Cardiology

## 2015-09-29 VITALS — BP 142/80 | HR 49 | Ht 63.0 in | Wt 156.0 lb

## 2015-09-29 DIAGNOSIS — R001 Bradycardia, unspecified: Secondary | ICD-10-CM

## 2015-09-29 DIAGNOSIS — I10 Essential (primary) hypertension: Secondary | ICD-10-CM

## 2015-09-29 NOTE — Telephone Encounter (Signed)
Multiple attempts made to contact patient.  This encounter will now be closed  

## 2015-09-29 NOTE — Progress Notes (Signed)
Cardiology Office Note  Date: 09/29/2015   ID: Danielle Parrish, Danielle Parrish [birth year], MRN [id number]  PCP: Kenn File, MD  Consulting Cardiologist: Rozann Lesches, MD   Chief Complaint  Patient presents with  . Bradycardia    History of Present Illness: Danielle Parrish is a 74 y.o. female referred for cardiology consultation by Dr. Wendi Snipes. I reviewed her recent history. She has been noted to have bradycardia on evaluation, not entirely clear whether this has been symptom provoking or not. She mainly complains of abdominal discomfort, has had a 30 pound weight loss over a period of several weeks, nausea during the day. She has had recent imaging studies including a CT as outlined below. She continues to work with Dr. Wendi Snipes for further evaluation.  Record review finds prior evaluation by Dr. Johnsie Cancel in August 2015 for preoperative assessment. At that time her ECG showed sinus bradycardia at 51 bpm with borderline prolonged PR interval. She also had an echocardiogram reporting LVEF 60-65% with grade 1 diastolic dysfunction, sclerotic aortic valve with trivial aortic regurgitation, MAC, and mild left atrial enlargement.  She tells me that she has had a slow heart rate documented in the past. She does not endorse any sudden unexplained lightheadedness or syncope. She does have frequent falls and relates this to gait instability and trouble with her knees. She has never lost consciousness with these falls.  Recent ECG reviewed below, normal intervals noted with sinus bradycardia. TSH was within normal range earlier this year. She does not report any history of tick born illness. Current medications reviewed, nothing to clearly explain a slow heart rate, Norvasc would typically not be implicated.   Past Medical History  Diagnosis Date  . Essential hypertension   . Osteoporosis   . Hyperlipidemia   . Vitamin D deficiency   . Anxiety   . GERD (gastroesophageal reflux disease)   .  Arthritis   . Chronic pain of multiple joints   . Sleep apnea     CPAP  . Hypothyroidism   . Depression     Past Surgical History  Procedure Laterality Date  . Abdominal hernia repair    . Abdominal hysterectomy  1977  . Knee arthroscopy Left 09/30/2014    Procedure: LEFT KNEE ARTHROSCOPY MEDIAL MENISECTOMY ABRASION CHONDROPLASTY SYNOVECTOMY SUPRAPATELLER CUFF;  Surgeon: Tobi Bastos, MD;  Location: [facility];  Service: Orthopedics;  Laterality: Left;    Current Outpatient Prescriptions  Medication Sig Dispense Refill  . amLODipine (NORVASC) 5 MG tablet Take 1 tablet (5 mg total) by mouth daily. 90 tablet 1  . Ferrous Sulfate 90 (18 FE) MG TABS Take 1 tablet by mouth every morning. 90 each 1  . furosemide (LASIX) 20 MG tablet Take 1 tablet (20 mg total) by mouth daily as needed. 30 tablet 0  . HYDROcodone-acetaminophen (NORCO) 7.5-325 MG tablet Take 1 tablet by mouth every 6 (six) hours as needed for moderate pain. 45 tablet 0  . levothyroxine (SYNTHROID, LEVOTHROID) 100 MCG tablet Take 1 tablet (100 mcg total) by mouth daily before breakfast. 90 tablet 1  . Multiple Vitamin (MULTIVITAMIN) tablet Take 1 tablet by mouth every morning.     Marland Kitchen omeprazole (PRILOSEC) 40 MG capsule Take 1 capsule (40 mg total) by mouth every morning. 90 capsule 1  . sertraline (ZOLOFT) 100 MG tablet Take 1 tablet (100 mg total) by mouth every morning. 90 tablet 1  . sodium chloride (OCEAN) 0.65 % SOLN nasal spray Place 1 spray into both nostrils  as needed for congestion.     No current facility-administered medications for this visit.    Allergies:  Crestor; Nsaids; Penicillins; and Vytorin   Social History: The patient  reports that she has never smoked. She has never used smokeless tobacco. She reports that she does not drink alcohol or use illicit drugs.   Family History: The patient's family history includes Cancer in her father; Hypertension in her mother; Stroke in her mother.   ROS:  Please see  the history of present illness. Otherwise, complete review of systems is positive for intermittent abdominal pain, nausea, and weight loss as outlined. Chronic knee pain.  All other systems are reviewed and negative.   Physical Exam: VS:  BP 142/80 mmHg  Pulse 49  Ht 5\' 3"  (1.6 m)  Wt 156 lb (70.761 kg)  BMI 27.64 kg/m2  SpO2 98%, BMI Body mass index is 27.64 kg/(m^2).  Wt Readings from Last 3 Encounters:  09/29/15 156 lb (70.761 kg)  09/23/15 158 lb (71.668 kg)  09/14/15 160 lb 3.2 oz (72.666 kg)     General: Patient appears comfortable at rest. HEENT: Conjunctiva and lids normal, oropharynx clear. Neck: Supple, no elevated JVP or carotid bruits, no thyromegaly. Lungs: Clear to auscultation, nonlabored breathing at rest. Cardiac: Regular rate and rhythm, no S3, soft systolic murmur, no pericardial rub. Abdomen: Soft, nontender, bowel sounds present, no guarding or rebound. Extremities: Mild ankle edema, left greater than right, distal pulses 2+. Skin: Warm and dry. Musculoskeletal: No kyphosis. Neuropsychiatric: Alert and oriented x3, affect grossly appropriate.   ECG: Tracing from 09/14/2015 showed sinus bradycardia at 51 bpm with decreased R wave progression, normal intervals.  Recent Labwork: 04/08/2015: TSH 1.630 07/20/2015: Hemoglobin 11.8* 08/26/2015: ALT 12; AST 13; BUN 9; Creatinine, Ser 0.62; Potassium 4.2; Sodium 138     Component Value Date/Time   CHOL 149 04/08/2015 0845   CHOL 154 01/08/2015 1026   CHOL 150 05/15/2013 0941   TRIG 138 04/08/2015 0845   TRIG 135 01/08/2015 1026   TRIG 123 05/15/2013 0941   HDL 49 04/08/2015 0845   HDL 47 01/08/2015 1026   HDL 48 05/15/2013 0941   CHOLHDL 3.0 04/08/2015 0845   LDLCALC 72 04/08/2015 0845   LDLCALC 60 09/18/2013 0849   LDLCALC 77 05/15/2013 0941    Other Studies Reviewed Today:  Echocardiogram 09/09/2015: Study Conclusions  - Left ventricle: The cavity size was normal. Wall thickness was normal. Systolic  function was normal. The estimated ejection fraction was in the range of 55% to 60%. Wall motion was normal; there were no regional wall motion abnormalities. Features are consistent with a pseudonormal left ventricular filling pattern, with concomitant abnormal relaxation and increased filling pressure (grade 2 diastolic dysfunction). - Aortic valve: Mildly calcified annulus. Trileaflet; mildly calcified leaflets. There was no stenosis. Mean gradient (S): 7 mm Hg. VTI ratio of LVOT to aortic valve: 0.71. Valve area (VTI): 2.01 cm^2. Valve area (Vmax): 2.25 cm^2. Valve area (Vmean): 2.34 cm^2. - Mitral valve: Calcified annulus. There was trivial regurgitation. - Left atrium: The atrium was mildly dilated. - Right atrium: Central venous pressure (est): 3 mm Hg. - Tricuspid valve: There was trivial regurgitation. - Pulmonary arteries: PA peak pressure: 28 mm Hg (S). - Pericardium, extracardiac: There was no pericardial effusion.  Impressions:  - Normal LV wall thickness with LVEF 55-60% and grade 2 diastolic dysfunction. Mild left atrial enlargement. Mild mitral and aortic annular calcification. Sclerotic aortic valve without stenosis. Trivial tricuspid regurgitation with PASP 28  mmHg.   ASSESSMENT AND PLAN:  1. Sinus bradycardia, not entirely clear if symptomatic or not. She does not endorse any lightheadedness or frank syncope. Recent ECG reviewed with normal intervals. Echocardiogram shows preserved LVEF with grade 2 diastolic dysfunction. Aortic valve was sclerotic but not stenotic. She has no history of tick born illness, thyroid studies within the last year were within normal range. Recommendation is to obtain a 48-hour Holter monitor to better understand heart rate variability. Otherwise, would continue observation, there is no clear indication for pacemaker at this time.  2. Essential hypertension, on Norvasc. It would not be typical for Norvasc to lead to  significant bradycardia.  3. Hypothyroidism, on Synthroid. TSH was normal back in April.  Current medicines were reviewed at length with the patient today.   Orders Placed This Encounter  Procedures  . Holter monitor - 48 hour    Disposition: Call with results.   Signed, Satira Sark, MD, [facility] 09/29/2015 10:57 AM    [facility]. [address], [place], [place] [address] Phone: [phone]; Fax: [phone]

## 2015-09-29 NOTE — Patient Instructions (Signed)
Your physician recommends that you schedule a follow-up appointment in: To be determined- will call with results of holter monitor  Your physician has recommended that you wear a holter monitor. Holter monitors are medical devices that record the heart's electrical activity. Doctors most often use these monitors to diagnose arrhythmias. Arrhythmias are problems with the speed or rhythm of the heartbeat. The monitor is a small, portable device. You can wear one while you do your normal daily activities. This is usually used to diagnose what is causing palpitations/syncope (passing out).  Your physician recommends that you continue on your current medications as directed. Please refer to the Current Medication list given to you today.  Thanks for choosing [facility]!!!

## 2015-10-04 ENCOUNTER — Encounter (HOSPITAL_COMMUNITY): Payer: Medicare Other

## 2015-10-04 DIAGNOSIS — F458 Other somatoform disorders: Secondary | ICD-10-CM | POA: Diagnosis not present

## 2015-10-04 DIAGNOSIS — Z86010 Personal history of colonic polyps: Secondary | ICD-10-CM | POA: Diagnosis not present

## 2015-10-04 DIAGNOSIS — R1314 Dysphagia, pharyngoesophageal phase: Secondary | ICD-10-CM | POA: Diagnosis not present

## 2015-10-05 ENCOUNTER — Ambulatory Visit (HOSPITAL_COMMUNITY)
Admission: RE | Admit: 2015-10-05 | Discharge: 2015-10-05 | Disposition: A | Discharge status: Home / Self Care | Payer: Medicare Other | Source: Ambulatory Visit | Attending: Cardiology | Admitting: Cardiology

## 2015-10-05 DIAGNOSIS — R001 Bradycardia, unspecified: Secondary | ICD-10-CM | POA: Diagnosis not present

## 2015-10-06 ENCOUNTER — Ambulatory Visit (INDEPENDENT_AMBULATORY_CARE_PROVIDER_SITE_OTHER): Payer: Medicare Other | Admitting: Family Medicine

## 2015-10-06 ENCOUNTER — Telehealth: Payer: Self-pay | Admitting: Family Medicine

## 2015-10-06 ENCOUNTER — Encounter: Payer: Self-pay | Admitting: Family Medicine

## 2015-10-06 VITALS — BP 140/82 | HR 86 | Temp 99.5°F | Ht 63.0 in | Wt 154.0 lb

## 2015-10-06 DIAGNOSIS — S161XXA Strain of muscle, fascia and tendon at neck level, initial encounter: Secondary | ICD-10-CM | POA: Diagnosis not present

## 2015-10-06 MED ORDER — KETOROLAC TROMETHAMINE 30 MG/ML IJ SOLN
30.0000 mg | Freq: Once | INTRAMUSCULAR | Status: AC
Start: 2015-10-06 — End: 2015-10-06
  Administered 2015-10-06: 30 mg via INTRAMUSCULAR

## 2015-10-06 NOTE — Telephone Encounter (Signed)
Pt given appt this afternoon at 5:15 in the after hours clinic.

## 2015-10-06 NOTE — Progress Notes (Signed)
BP 140/82 mmHg  Pulse 86  Temp(Src) 99.5 F (37.5 C) (Oral)  Ht '5\' 3"'  (1.6 m)  Wt 154 lb (69.854 kg)  BMI 27.29 kg/m2   Subjective:    Patient ID: Danielle Parrish, female    DOB: 1941-03-05, 74 y.o.   MRN: [id number]  HPI: Danielle Parrish is a 74 y.o. female presenting on 10/06/2015 for Headache; Neck Pain; and Fever and chills   HPI Neck strain Patient has 3 day history of neck pain bilaterally in the muscles that she describes as tightness. That is leaving the headaches going up over her head. She gets headaches regularly she also gets muscular pains regularly, similar to a fibromyalgia patient. She took her Vicodin and it did not affect his pain. She denies any fevers or chills or visual changes or tingling or numbness.  Relevant past medical, surgical, family and social history reviewed and updated as indicated. Interim medical history since our last visit reviewed. Allergies and medications reviewed and updated.  Review of Systems  Constitutional: Negative for fever and chills.  HENT: Negative for congestion, ear discharge and ear pain.   Eyes: Negative for redness and visual disturbance.  Respiratory: Negative for chest tightness and shortness of breath.   Cardiovascular: Negative for chest pain and leg swelling.  Genitourinary: Negative for dysuria and difficulty urinating.  Musculoskeletal: Positive for myalgias, neck pain and neck stiffness. Negative for back pain and gait problem.  Skin: Negative for rash.  Neurological: Positive for headaches. Negative for light-headedness.  Psychiatric/Behavioral: Negative for behavioral problems and agitation.  All other systems reviewed and are negative.   Per HPI unless specifically indicated above     Medication List       This list is accurate as of: 10/06/15  5:47 PM.  Always use your most recent med list.               amLODipine 5 MG tablet  Commonly known as:  NORVASC  Take 1 tablet (5 mg total) by mouth daily.      Ferrous Sulfate 90 (18 FE) MG Tabs  Take 1 tablet by mouth every morning.     furosemide 20 MG tablet  Commonly known as:  LASIX  Take 1 tablet (20 mg total) by mouth daily as needed.     HYDROcodone-acetaminophen 7.5-325 MG tablet  Commonly known as:  NORCO  Take 1 tablet by mouth every 6 (six) hours as needed for moderate pain.     levothyroxine 100 MCG tablet  Commonly known as:  SYNTHROID, LEVOTHROID  Take 1 tablet (100 mcg total) by mouth daily before breakfast.     multivitamin tablet  Take 1 tablet by mouth every morning.     omeprazole 40 MG capsule  Commonly known as:  PRILOSEC  Take 1 capsule (40 mg total) by mouth every morning.     sertraline 100 MG tablet  Commonly known as:  ZOLOFT  Take 1 tablet (100 mg total) by mouth every morning.     sodium chloride 0.65 % Soln nasal spray  Commonly known as:  OCEAN  Place 1 spray into both nostrils as needed for congestion.           Objective:    BP 140/82 mmHg  Pulse 86  Temp(Src) 99.5 F (37.5 C) (Oral)  Ht '5\' 3"'  (1.6 m)  Wt 154 lb (69.854 kg)  BMI 27.29 kg/m2  Wt Readings from Last 3 Encounters:  10/06/15 154 lb (69.854 kg)  09/29/15 156 lb (70.761 kg)  09/23/15 158 lb (71.668 kg)    Physical Exam  Constitutional: She is oriented to person, place, and time. She appears well-developed and well-nourished. No distress.  Eyes: Conjunctivae and EOM are normal. Pupils are equal, round, and reactive to light.  Cardiovascular: Normal rate, regular rhythm, normal heart sounds and intact distal pulses.   No murmur heard. Pulmonary/Chest: Effort normal and breath sounds normal. No respiratory distress. She has no wheezes.  Musculoskeletal: Normal range of motion. She exhibits no edema.       Cervical back: She exhibits tenderness and spasm. She exhibits normal range of motion, no bony tenderness, no swelling, no edema, no deformity and normal pulse.  Neurological: She is alert and oriented to person, place,  and time. She has normal strength. No cranial nerve deficit or sensory deficit. Coordination normal.  Skin: Skin is warm and dry. No rash noted. She is not diaphoretic.  Psychiatric: She has a normal mood and affect. Her behavior is normal.  Vitals reviewed.   Results for orders placed or performed in visit on 08/26/15  CMP14+EGFR  Result Value Ref Range   Glucose 102 (H) 65 - 99 mg/dL   BUN 9 8 - 27 mg/dL   Creatinine, Ser 0.62 0.57 - 1.00 mg/dL   GFR calc non Af Amer 89 >59 mL/min/1.73   GFR calc Af Amer 103 >59 mL/min/1.73   BUN/Creatinine Ratio 15 11 - 26   Sodium 138 134 - 144 mmol/L   Potassium 4.2 3.5 - 5.2 mmol/L   Chloride 95 (L) 97 - 108 mmol/L   CO2 25 18 - 29 mmol/L   Calcium 9.5 8.7 - 10.3 mg/dL   Total Protein 6.4 6.0 - 8.5 g/dL   Albumin 3.6 3.5 - 4.8 g/dL   Globulin, Total 2.8 1.5 - 4.5 g/dL   Albumin/Globulin Ratio 1.3 1.1 - 2.5   Bilirubin Total 0.2 0.0 - 1.2 mg/dL   Alkaline Phosphatase 107 39 - 117 IU/L   AST 13 0 - 40 IU/L   ALT 12 0 - 32 IU/L  Lipase  Result Value Ref Range   Lipase 30 0 - 59 U/L  CBC with Differential  Result Value Ref Range   WBC 6.8 3.4 - 10.8 x10E3/uL   RBC 3.91 3.77 - 5.28 x10E6/uL   Hemoglobin 11.2 11.1 - 15.9 g/dL   Hematocrit 35.3 34.0 - 46.6 %   MCV 90 79 - 97 fL   MCH 28.6 26.6 - 33.0 pg   MCHC 31.7 31.5 - 35.7 g/dL   RDW 13.5 12.3 - 15.4 %   Platelets 434 (H) 150 - 379 x10E3/uL   Neutrophils 68 %   Lymphs 20 %   Monocytes 11 %   Eos 1 %   Basos 0 %   Neutrophils Absolute 4.5 1.4 - 7.0 x10E3/uL   Lymphocytes Absolute 1.4 0.7 - 3.1 x10E3/uL   Monocytes Absolute 0.7 0.1 - 0.9 x10E3/uL   EOS (ABSOLUTE) 0.1 0.0 - 0.4 x10E3/uL   Basophils Absolute 0.0 0.0 - 0.2 x10E3/uL   Immature Granulocytes 0 %   Immature Grans (Abs) 0.0 0.0 - 0.1 x10E3/uL      Assessment & Plan:       Problem List Items Addressed This Visit    None    Visit Diagnoses    Neck strain, initial encounter    -  Primary    She's had neck strain  and Tightness that is causing headaches for the past  2 days, we will try Toradol. If not improved send to PT    Relevant Medications    ketorolac (TORADOL) 30 MG/ML injection 30 mg (Start on 10/06/2015  6:00 PM)        Follow up plan: Return if symptoms worsen or fail to improve.  Caryl Pina, MD [facility] 10/06/2015, 5:47 PM

## 2015-10-06 NOTE — Patient Instructions (Signed)
Muscle Pain, Adult  Muscle pain (myalgia) may be caused by many things, including:  · Overuse or muscle strain, especially if you are not in shape. This is the most common cause of muscle pain.  · Injury.  · Bruises.  · Viruses, such as the flu.  · Infectious diseases.  · Fibromyalgia, which is a chronic condition that causes muscle tenderness, fatigue, and headache.  · Autoimmune diseases, including lupus.  · Certain drugs, including ACE inhibitors and statins.  Muscle pain may be mild or severe. In most cases, the pain lasts only a short time and goes away without treatment. To diagnose the cause of your muscle pain, your health care provider will take your medical history. This means he or she will ask you when your muscle pain began and what has been happening. If you have not had muscle pain for very long, your health care provider may want to wait before doing much testing. If your muscle pain has lasted a long time, your health care provider may want to run tests right away. If your health care provider thinks your muscle pain may be caused by illness, you may need to have additional tests to rule out certain conditions.   Treatment for muscle pain depends on the cause. Home care is often enough to relieve muscle pain. Your health care provider may also prescribe anti-inflammatory medicine.  HOME CARE INSTRUCTIONS  Watch your condition for any changes. The following actions may help to lessen any discomfort you are feeling:  · Only take over-the-counter or prescription medicines as directed by your health care provider.  · Apply ice to the sore muscle:    Put ice in a plastic bag.    Place a towel between your skin and the bag.    Leave the ice on for 15-20 minutes, 3-4 times a day.  · You may alternate applying hot and cold packs to the muscle as directed by your health care provider.  · If overuse is causing your muscle pain, slow down your activities until the pain goes away.    Remember that it is normal  to feel some muscle pain after starting a workout program. Muscles that have not been used often will be sore at first.    Do regular, gentle exercises if you are not usually active.    Warm up before exercising to lower your risk of muscle pain.  · Do not continue working out if the pain is very bad. Bad pain could mean you have injured a muscle.  SEEK MEDICAL CARE IF:  · Your muscle pain gets worse, and medicines do not help.  · You have muscle pain that lasts longer than 3 days.  · You have a rash or fever along with muscle pain.  · You have muscle pain after a tick bite.  · You have muscle pain while working out, even though you are in good physical condition.  · You have redness, soreness, or swelling along with muscle pain.  · You have muscle pain after starting a new medicine or changing the dose of a medicine.  SEEK IMMEDIATE MEDICAL CARE IF:  · You have trouble breathing.  · You have trouble swallowing.  · You have muscle pain along with a stiff neck, fever, and vomiting.  · You have severe muscle weakness or cannot move part of your body.  MAKE SURE YOU:   · Understand these instructions.  · Will watch your condition.  · Will get   help right away if you are not doing well or get worse.     This information is not intended to replace advice given to you by your health care provider. Make sure you discuss any questions you have with your health care provider.     Document Released: 11/02/2006 Document Revised: 01/01/2015 Document Reviewed: 10/07/2013  Elsevier Interactive Patient Education ©2016 [employer]

## 2015-10-11 ENCOUNTER — Telehealth: Payer: Self-pay | Admitting: Family Medicine

## 2015-10-11 NOTE — Telephone Encounter (Signed)
This is still causing her problems then she will have to be seen again for another shot to be given. I would be happy to see her.

## 2015-10-11 NOTE — Telephone Encounter (Signed)
Stp and she says her head is hurting again and it's not due to the neck strain, she wants to see if she can get another shot of Toradol. Please advise.

## 2015-10-11 NOTE — Telephone Encounter (Signed)
Patient aware and appointment given for tomorrow am with [facility].

## 2015-10-12 ENCOUNTER — Ambulatory Visit (INDEPENDENT_AMBULATORY_CARE_PROVIDER_SITE_OTHER): Payer: Medicare Other | Admitting: Family Medicine

## 2015-10-12 ENCOUNTER — Encounter: Payer: Self-pay | Admitting: Family Medicine

## 2015-10-12 VITALS — BP 174/78 | HR 57 | Temp 97.5°F | Ht 63.0 in | Wt 157.2 lb

## 2015-10-12 DIAGNOSIS — E039 Hypothyroidism, unspecified: Secondary | ICD-10-CM

## 2015-10-12 DIAGNOSIS — R51 Headache: Secondary | ICD-10-CM | POA: Diagnosis not present

## 2015-10-12 DIAGNOSIS — I10 Essential (primary) hypertension: Secondary | ICD-10-CM

## 2015-10-12 DIAGNOSIS — R519 Headache, unspecified: Secondary | ICD-10-CM

## 2015-10-12 MED ORDER — AMLODIPINE BESYLATE 10 MG PO TABS: 10.0000 mg | ORAL_TABLET | Freq: Every day | ORAL | Status: DC

## 2015-10-12 MED ORDER — KETOROLAC TROMETHAMINE 30 MG/ML IJ SOLN
30.0000 mg | Freq: Once | INTRAMUSCULAR | Status: AC
  Administered 2015-10-12: 30 mg via INTRAMUSCULAR

## 2015-10-12 MED ORDER — FUROSEMIDE 20 MG PO TABS: 20.0000 mg | ORAL_TABLET | Freq: Every day | ORAL | Status: DC | PRN

## 2015-10-12 NOTE — Progress Notes (Signed)
BP 174/78 mmHg  Pulse 57  Temp(Src) 97.5 F (36.4 C) (Oral)  Ht '5\' 3"'  (1.6 m)  Wt 157 lb 3.2 oz (71.305 kg)  BMI 27.85 kg/m2   Subjective:    Patient ID: Danielle Parrish, female    DOB: 15-Mar-1941, 74 y.o.   MRN: [id number]  HPI: Danielle Parrish is a 74 y.o. female presenting on 10/12/2015 for Headache; Hypertension; and Pain in right side of chest/rib area   HPI Hypertension Patient has elevated blood pressure today despite being on her 2 medications. She used to be on 4 medications for blood pressure but that was stopped because she was getting too low and dizzy. Over the past few days at home she is also begin blood pressures up in the 180s and 259D on the systolic side. She has been having headaches from this, but denies blurred vision or weakness or numbness or facial droop.  Hypothyroidism She is having a lot of symptoms of low heart rate is been evaluated by cardiology and high blood pressure and weight issues that she is gaining weight. She has not had her thyroid checked in 6 months. At that time it was normal. She denies any issues with hair changes.  Frequent headaches Patient is concerned about her frequent headaches which improved greatly after the Toradol shot she had last visit while she was here. She says her headaches now are in the temporal region on the left side, but denies any problems with photophobia or phonophobia. She is concerned about whether there could be a tumor or something inside her brain.  Relevant past medical, surgical, family and social history reviewed and updated as indicated. Interim medical history since our last visit reviewed. Allergies and medications reviewed and updated.  Review of Systems  Constitutional: Negative for fever and chills.  HENT: Negative for congestion, ear discharge and ear pain.   Eyes: Negative for redness and visual disturbance.  Respiratory: Negative for chest tightness and shortness of breath.   Cardiovascular:  Negative for chest pain, palpitations and leg swelling.  Endocrine: Negative for cold intolerance and heat intolerance.  Genitourinary: Negative for dysuria and difficulty urinating.  Musculoskeletal: Negative for back pain and gait problem.  Skin: Negative for color change and rash.  Neurological: Positive for headaches. Negative for dizziness, weakness, light-headedness and numbness.  Psychiatric/Behavioral: Negative for behavioral problems and agitation.  All other systems reviewed and are negative.   Per HPI unless specifically indicated above     Medication List       This list is accurate as of: 10/12/15  9:07 AM.  Always use your most recent med list.               amLODipine 10 MG tablet  Commonly known as:  NORVASC  Take 1 tablet (10 mg total) by mouth daily.     Ferrous Sulfate 90 (18 FE) MG Tabs  Take 1 tablet by mouth every morning.     furosemide 20 MG tablet  Commonly known as:  LASIX  Take 1 tablet (20 mg total) by mouth daily as needed.     hydrochlorothiazide 25 MG tablet  Commonly known as:  HYDRODIURIL  Take 25 mg by mouth daily.     HYDROcodone-acetaminophen 7.5-325 MG tablet  Commonly known as:  NORCO  Take 1 tablet by mouth every 6 (six) hours as needed for moderate pain.     levothyroxine 100 MCG tablet  Commonly known as:  SYNTHROID, LEVOTHROID  Take 1  tablet (100 mcg total) by mouth daily before breakfast.     multivitamin tablet  Take 1 tablet by mouth every morning.     omeprazole 40 MG capsule  Commonly known as:  PRILOSEC  Take 1 capsule (40 mg total) by mouth every morning.     sertraline 100 MG tablet  Commonly known as:  ZOLOFT  Take 1 tablet (100 mg total) by mouth every morning.     sodium chloride 0.65 % Soln nasal spray  Commonly known as:  OCEAN  Place 1 spray into both nostrils as needed for congestion.           Objective:    BP 174/78 mmHg  Pulse 57  Temp(Src) 97.5 F (36.4 C) (Oral)  Ht '5\' 3"'  (1.6 m)  Wt  157 lb 3.2 oz (71.305 kg)  BMI 27.85 kg/m2  Wt Readings from Last 3 Encounters:  10/12/15 157 lb 3.2 oz (71.305 kg)  10/06/15 154 lb (69.854 kg)  09/29/15 156 lb (70.761 kg)    Physical Exam  Constitutional: She is oriented to person, place, and time. She appears well-developed and well-nourished. No distress.  HENT:  Right Ear: External ear normal.  Left Ear: External ear normal.  Nose: Nose normal.  Mouth/Throat: Oropharynx is clear and moist. No oropharyngeal exudate.  Eyes: Conjunctivae and EOM are normal. Pupils are equal, round, and reactive to light.  Neck: Neck supple. No thyromegaly present.  Cardiovascular: Normal rate, regular rhythm, normal heart sounds and intact distal pulses.   No murmur heard. Pulmonary/Chest: Effort normal and breath sounds normal. No respiratory distress. She has no wheezes.  Musculoskeletal: Normal range of motion. She exhibits no edema or tenderness (no tenderness in back and neck were over scalp to palpation).  Lymphadenopathy:    She has no cervical adenopathy.  Neurological: She is alert and oriented to person, place, and time. Coordination normal.  Skin: Skin is warm and dry. No rash noted. She is not diaphoretic.  Psychiatric: She has a normal mood and affect. Her behavior is normal.  Vitals reviewed.   Results for orders placed or performed in visit on 08/26/15  CMP14+EGFR  Result Value Ref Range   Glucose 102 (H) 65 - 99 mg/dL   BUN 9 8 - 27 mg/dL   Creatinine, Ser 0.62 0.57 - 1.00 mg/dL   GFR calc non Af Amer 89 >59 mL/min/1.73   GFR calc Af Amer 103 >59 mL/min/1.73   BUN/Creatinine Ratio 15 11 - 26   Sodium 138 134 - 144 mmol/L   Potassium 4.2 3.5 - 5.2 mmol/L   Chloride 95 (L) 97 - 108 mmol/L   CO2 25 18 - 29 mmol/L   Calcium 9.5 8.7 - 10.3 mg/dL   Total Protein 6.4 6.0 - 8.5 g/dL   Albumin 3.6 3.5 - 4.8 g/dL   Globulin, Total 2.8 1.5 - 4.5 g/dL   Albumin/Globulin Ratio 1.3 1.1 - 2.5   Bilirubin Total 0.2 0.0 - 1.2 mg/dL    Alkaline Phosphatase 107 39 - 117 IU/L   AST 13 0 - 40 IU/L   ALT 12 0 - 32 IU/L  Lipase  Result Value Ref Range   Lipase 30 0 - 59 U/L  CBC with Differential  Result Value Ref Range   WBC 6.8 3.4 - 10.8 x10E3/uL   RBC 3.91 3.77 - 5.28 x10E6/uL   Hemoglobin 11.2 11.1 - 15.9 g/dL   Hematocrit 35.3 34.0 - 46.6 %   MCV 90 79 - 97  fL   MCH 28.6 26.6 - 33.0 pg   MCHC 31.7 31.5 - 35.7 g/dL   RDW 13.5 12.3 - 15.4 %   Platelets 434 (H) 150 - 379 x10E3/uL   Neutrophils 68 %   Lymphs 20 %   Monocytes 11 %   Eos 1 %   Basos 0 %   Neutrophils Absolute 4.5 1.4 - 7.0 x10E3/uL   Lymphocytes Absolute 1.4 0.7 - 3.1 x10E3/uL   Monocytes Absolute 0.7 0.1 - 0.9 x10E3/uL   EOS (ABSOLUTE) 0.1 0.0 - 0.4 x10E3/uL   Basophils Absolute 0.0 0.0 - 0.2 x10E3/uL   Immature Granulocytes 0 %   Immature Grans (Abs) 0.0 0.0 - 0.1 x10E3/uL      Assessment & Plan:   Problem List Items Addressed This Visit      Cardiovascular and Mediastinum   HTN (hypertension) - Primary    Blood pressure elevated, increased Norvasc from 5 to 10, continue hydrochlorothiazide      Relevant Medications   hydrochlorothiazide (HYDRODIURIL) 25 MG tablet   furosemide (LASIX) 20 MG tablet   amLODipine (NORVASC) 10 MG tablet     Endocrine   Hypothyroidism    Recheck thyroid levels      Relevant Orders   Thyroid Panel With TSH (Completed)    Other Visit Diagnoses    Frequent headaches        Do a CT scan and refer to neurology    Relevant Medications    amLODipine (NORVASC) 10 MG tablet    ketorolac (TORADOL) 30 MG/ML injection 30 mg (Completed)    Other Relevant Orders    CT Head Wo Contrast    Ambulatory referral to Neurology        Follow up plan: Return in about 4 weeks (around 11/09/2015), or if symptoms worsen or fail to improve, for htn.  Caryl Pina, MD [facility] 10/12/2015, 9:07 AM

## 2015-10-13 NOTE — Assessment & Plan Note (Signed)
Recheck thyroid levels

## 2015-10-13 NOTE — Assessment & Plan Note (Signed)
Blood pressure elevated, increased Norvasc from 5 to 10, continue hydrochlorothiazide

## 2015-10-14 ENCOUNTER — Telehealth: Payer: Self-pay | Admitting: Family Medicine

## 2015-10-14 LAB — THYROID PANEL WITH TSH
Free Thyroxine Index: 3.3 (ref 1.2–4.9)
T3 Uptake Ratio: 32 % (ref 24–39)
T4, Total: 10.3 ug/dL (ref 4.5–12.0)
TSH: 1.51 u[IU]/mL (ref 0.450–4.500)

## 2015-10-15 ENCOUNTER — Ambulatory Visit (HOSPITAL_COMMUNITY): Payer: Medicare Other

## 2015-10-20 ENCOUNTER — Telehealth: Payer: Self-pay | Admitting: Family Medicine

## 2015-10-20 DIAGNOSIS — M1990 Unspecified osteoarthritis, unspecified site: Secondary | ICD-10-CM

## 2015-10-21 NOTE — Telephone Encounter (Signed)
Called and discussed. No narcotic refill over the phone, she understands, states she will ome in tomorrow.   Laroy Apple, MD [facility] 10/21/2015, 1:26 PM

## 2015-10-21 NOTE — Telephone Encounter (Signed)
Last filled and seen by you on 09/23/15. Rx will print

## 2015-10-22 ENCOUNTER — Encounter: Payer: Self-pay | Admitting: Family Medicine

## 2015-10-22 ENCOUNTER — Ambulatory Visit (INDEPENDENT_AMBULATORY_CARE_PROVIDER_SITE_OTHER): Payer: Medicare Other | Admitting: Family Medicine

## 2015-10-22 VITALS — BP 127/75 | HR 73 | Temp 97.9°F | Ht 63.0 in | Wt 157.2 lb

## 2015-10-22 DIAGNOSIS — G894 Chronic pain syndrome: Secondary | ICD-10-CM | POA: Diagnosis not present

## 2015-10-22 DIAGNOSIS — M1990 Unspecified osteoarthritis, unspecified site: Secondary | ICD-10-CM | POA: Diagnosis not present

## 2015-10-22 DIAGNOSIS — M1712 Unilateral primary osteoarthritis, left knee: Secondary | ICD-10-CM | POA: Diagnosis not present

## 2015-10-22 DIAGNOSIS — R51 Headache: Secondary | ICD-10-CM

## 2015-10-22 DIAGNOSIS — R519 Headache, unspecified: Secondary | ICD-10-CM

## 2015-10-22 MED ORDER — HYDROCODONE-ACETAMINOPHEN 7.5-325 MG PO TABS: 1.0000 | ORAL_TABLET | Freq: Four times a day (QID) | ORAL | Status: DC | PRN

## 2015-10-22 NOTE — Progress Notes (Signed)
   HPI  Patient presents today here for follow-up of chronic pain and headache.  She's been taking hydrocodone for about 2-3 years. She began for left knee pain. She states that she does not require medications everyday, she uses a half a pill most of the time and a whole pill when the pain is most severe. She does go 1-2 days without medications frequently but never one week.H zHer last pill was this morning.  HA L sided temporal HA with dull persistent nature, some eye swelling that has resolved and some tearing.  State sthat her temple is frequently tender to the touch Has been going on for about 2 weeks, hydrocodone not helping much Has neuro appt next month   PMH: Smoking status noted ROS: Per HPI  Objective: BP 127/75 mmHg  Pulse 73  Temp(Src) 97.9 F (36.6 C) (Oral)  Ht $R'5\' 3"'Zu$  (1.6 m)  Wt 157 lb 3.2 oz (71.305 kg)  BMI 27.85 kg/m2 Gen: NAD, alert, cooperative with exam HEENT: NCAT, EOMI, PERRL, tenderness to palpation over L temporal area CV: RRR, good S1/S2, no murmur Resp: CTABL, no wheezes, non-labored Ext: No edema, warm Neuro: Alert and oriented, 2+ patellar tendon reflexes, strength 5/5 and sensation intact in all 4 extremities, cranial nerves II through XII intact  Assessment and plan:  # Chronic pain syndrome Primary chronic pain is her left knee pain. She is reasonable narcotic use We discussed a plan for safe narcotic prescribing including reviewing the state database, annual urine drug screens, and using only one practice for prescriptions. Also no prescriptions without appointment. She does go days without use, she also uses a half of a pill whenever she can. Consider changing SSRI to SNRI for adjunct pain relief  # HA Unclear etiology, possibly cluster HA, possibly GCA with temporal tenderness ESR to r/o GCA Encouraged keeping her neuro appt Consider sumatriptan sub q    Orders Placed This Encounter  Procedures  . Sedimentation rate  .  ToxASSURE Select 13 (MW), Urine    Meds ordered this encounter  Medications  . HYDROcodone-acetaminophen (NORCO) 7.5-325 MG tablet    Sig: Take 1 tablet by mouth every 6 (six) hours as needed for moderate pain.    Dispense:  45 tablet    Refill:  0    Laroy Apple, MD [facility] 10/22/2015, 10:58 AM

## 2015-10-22 NOTE — Patient Instructions (Addendum)
Great to see you!  You will need to be seen before refills for hydrocodone in general.   We will call with your lab result within 1 week  Be sure to keep your appointment with neurology

## 2015-10-29 LAB — TOXASSURE SELECT 13 (MW), URINE: PDF: 0

## 2015-10-29 LAB — SEDIMENTATION RATE: Sed Rate: 18 mm/hr (ref 0–40)

## 2015-11-05 ENCOUNTER — Telehealth: Payer: Self-pay | Admitting: Family Medicine

## 2015-11-06 ENCOUNTER — Encounter (HOSPITAL_COMMUNITY): Payer: Self-pay | Admitting: Emergency Medicine

## 2015-11-06 ENCOUNTER — Emergency Department (HOSPITAL_COMMUNITY): Payer: Medicare Other

## 2015-11-06 ENCOUNTER — Emergency Department (HOSPITAL_COMMUNITY)
Admission: EM | Admit: 2015-11-06 | Discharge: 2015-11-06 | Disposition: A | Discharge status: Home / Self Care | Payer: Medicare Other | Attending: Emergency Medicine | Admitting: Emergency Medicine

## 2015-11-06 DIAGNOSIS — K219 Gastro-esophageal reflux disease without esophagitis: Secondary | ICD-10-CM | POA: Diagnosis not present

## 2015-11-06 DIAGNOSIS — G510 Bell's palsy: Secondary | ICD-10-CM | POA: Diagnosis not present

## 2015-11-06 DIAGNOSIS — F1110 Opioid abuse, uncomplicated: Secondary | ICD-10-CM | POA: Diagnosis not present

## 2015-11-06 DIAGNOSIS — R29810 Facial weakness: Secondary | ICD-10-CM | POA: Diagnosis not present

## 2015-11-06 DIAGNOSIS — G8929 Other chronic pain: Secondary | ICD-10-CM | POA: Insufficient documentation

## 2015-11-06 DIAGNOSIS — N390 Urinary tract infection, site not specified: Secondary | ICD-10-CM

## 2015-11-06 DIAGNOSIS — F419 Anxiety disorder, unspecified: Secondary | ICD-10-CM | POA: Diagnosis not present

## 2015-11-06 DIAGNOSIS — E039 Hypothyroidism, unspecified: Secondary | ICD-10-CM | POA: Insufficient documentation

## 2015-11-06 DIAGNOSIS — Z79899 Other long term (current) drug therapy: Secondary | ICD-10-CM | POA: Diagnosis not present

## 2015-11-06 DIAGNOSIS — Z880 Allergy status to penicillin: Secondary | ICD-10-CM | POA: Diagnosis not present

## 2015-11-06 DIAGNOSIS — F329 Major depressive disorder, single episode, unspecified: Secondary | ICD-10-CM | POA: Insufficient documentation

## 2015-11-06 DIAGNOSIS — M1990 Unspecified osteoarthritis, unspecified site: Secondary | ICD-10-CM | POA: Insufficient documentation

## 2015-11-06 DIAGNOSIS — Z9981 Dependence on supplemental oxygen: Secondary | ICD-10-CM | POA: Diagnosis not present

## 2015-11-06 DIAGNOSIS — G4730 Sleep apnea, unspecified: Secondary | ICD-10-CM | POA: Insufficient documentation

## 2015-11-06 DIAGNOSIS — I10 Essential (primary) hypertension: Secondary | ICD-10-CM | POA: Diagnosis not present

## 2015-11-06 LAB — COMPREHENSIVE METABOLIC PANEL
ALT: 13 U/L — ABNORMAL LOW (ref 14–54)
AST: 14 U/L — ABNORMAL LOW (ref 15–41)
Albumin: 3.4 g/dL — ABNORMAL LOW (ref 3.5–5.0)
Alkaline Phosphatase: 121 U/L (ref 38–126)
Anion gap: 7 (ref 5–15)
BUN: 11 mg/dL (ref 6–20)
CO2: 32 mmol/L (ref 22–32)
Calcium: 9.6 mg/dL (ref 8.9–10.3)
Chloride: 99 mmol/L — ABNORMAL LOW (ref 101–111)
Creatinine, Ser: 0.66 mg/dL (ref 0.44–1.00)
GFR calc Af Amer: >60 mL/min (ref >60)
GFR calc non Af Amer: >60 mL/min (ref >60)
Glucose, Bld: 95 mg/dL (ref 65–99)
Potassium: 4 mmol/L (ref 3.5–5.1)
Sodium: 138 mmol/L (ref 135–145)
Total Bilirubin: 0.3 mg/dL (ref 0.3–1.2)
Total Protein: 7.5 g/dL (ref 6.5–8.1)

## 2015-11-06 LAB — PROTIME-INR
INR: 1.03 (ref 0.00–1.49)
Prothrombin Time: 13.7 seconds (ref 11.6–15.2)

## 2015-11-06 LAB — RAPID URINE DRUG SCREEN, HOSP PERFORMED
Amphetamines: NOT DETECTED
Barbiturates: NOT DETECTED
Benzodiazepines: NOT DETECTED
Cocaine: NOT DETECTED
Opiates: POSITIVE — AB
Tetrahydrocannabinol: NOT DETECTED

## 2015-11-06 LAB — URINALYSIS, ROUTINE W REFLEX MICROSCOPIC
Bilirubin Urine: NEGATIVE
Glucose, UA: NEGATIVE mg/dL
Hgb urine dipstick: NEGATIVE
Ketones, ur: NEGATIVE mg/dL
Leukocytes, UA: NEGATIVE
Nitrite: POSITIVE — AB
Protein, ur: NEGATIVE mg/dL
Specific Gravity, Urine: 1.015 (ref 1.005–1.030)
Urobilinogen, UA: 0.2 mg/dL (ref 0.0–1.0)
pH: 6 (ref 5.0–8.0)

## 2015-11-06 LAB — I-STAT CHEM 8, ED
BUN: 10 mg/dL (ref 6–20)
Calcium, Ion: 1.17 mmol/L (ref 1.13–1.30)
Chloride: 97 mmol/L — ABNORMAL LOW (ref 101–111)
Creatinine, Ser: 0.7 mg/dL (ref 0.44–1.00)
Glucose, Bld: 92 mg/dL (ref 65–99)
HCT: 40 % (ref 36.0–46.0)
Hemoglobin: 13.6 g/dL (ref 12.0–15.0)
Potassium: 3.9 mmol/L (ref 3.5–5.1)
Sodium: 137 mmol/L (ref 135–145)
TCO2: 29 mmol/L (ref 0–100)

## 2015-11-06 LAB — APTT: aPTT: 38 seconds — ABNORMAL HIGH (ref 24–37)

## 2015-11-06 LAB — CBC
HCT: 37 % (ref 36.0–46.0)
Hemoglobin: 11.7 g/dL — ABNORMAL LOW (ref 12.0–15.0)
MCH: 28.1 pg (ref 26.0–34.0)
MCHC: 31.6 g/dL (ref 30.0–36.0)
MCV: 88.7 fL (ref 78.0–100.0)
Platelets: 317 10*3/uL (ref 150–400)
RBC: 4.17 MIL/uL (ref 3.87–5.11)
RDW: 14.3 % (ref 11.5–15.5)
WBC: 8.8 10*3/uL (ref 4.0–10.5)

## 2015-11-06 LAB — DIFFERENTIAL
Basophils Absolute: 0 10*3/uL (ref 0.0–0.1)
Basophils Relative: 0 %
Eosinophils Absolute: 0.3 10*3/uL (ref 0.0–0.7)
Eosinophils Relative: 3 %
Lymphocytes Relative: 29 %
Lymphs Abs: 2.6 10*3/uL (ref 0.7–4.0)
Monocytes Absolute: 0.8 10*3/uL (ref 0.1–1.0)
Monocytes Relative: 10 %
Neutro Abs: 5.1 10*3/uL (ref 1.7–7.7)
Neutrophils Relative %: 58 %

## 2015-11-06 LAB — URINE MICROSCOPIC-ADD ON

## 2015-11-06 LAB — ETHANOL: Alcohol, Ethyl (B): <5 mg/dL (ref <5)

## 2015-11-06 MED ORDER — CIPROFLOXACIN HCL 250 MG PO TABS
500.0000 mg | ORAL_TABLET | Freq: Once | ORAL | Status: AC
  Administered 2015-11-06: 500 mg via ORAL
  Filled 2015-11-06: qty 2

## 2015-11-06 MED ORDER — CIPROFLOXACIN HCL 500 MG PO TABS: 500.0000 mg | ORAL_TABLET | Freq: Two times a day (BID) | ORAL | Status: DC

## 2015-11-06 MED ORDER — VALACYCLOVIR HCL 1 G PO TABS: 500.0000 mg | ORAL_TABLET | Freq: Two times a day (BID) | ORAL | Status: DC

## 2015-11-06 MED ORDER — ARTIFICIAL TEARS OP OINT: TOPICAL_OINTMENT | OPHTHALMIC | Status: DC | PRN

## 2015-11-06 NOTE — Discharge Instructions (Signed)
See your doctor this week - read attachments - occasionally you will need referral to a Neurologist for evaluation if you continue to have symptoms past 2 weeks.

## 2015-11-06 NOTE — ED Provider Notes (Signed)
CSN: [id number]     Arrival date & time 11/06/15  1723 History   First MD Initiated Contact with Patient 11/06/15 1725     Chief Complaint  Patient presents with  . Facial Droop     (Consider location/radiation/quality/duration/timing/severity/associated sxs/prior Treatment) HPI Comments: The patient is a 74 year old female, she has a history of hypertension and hyperlipidemia, she presents to the hospital with a complaint of right-sided facial droop which started last night at about 6:00 PM. The grandson noticed her face drooping, she has not had any other symptoms including blurred vision, slurred speech, difficulty with strength sensation or balance. The symptoms are persistent over the last 24 hours, nothing seems to make it better or worse. She has no prior history of transient ischemic attack or stroke. She has not taken any medications for this. She finally agreed to come to the hospital after a paramedic friend of the family evaluated her and encouraged her to come.  The history is provided by the patient.    Past Medical History  Diagnosis Date  . Essential hypertension   . Osteoporosis   . Hyperlipidemia   . Vitamin D deficiency   . Anxiety   . GERD (gastroesophageal reflux disease)   . Arthritis   . Chronic pain of multiple joints   . Sleep apnea     CPAP  . Hypothyroidism   . Depression    Past Surgical History  Procedure Laterality Date  . Abdominal hernia repair    . Abdominal hysterectomy  1977  . Knee arthroscopy Left 09/30/2014    Procedure: LEFT KNEE ARTHROSCOPY MEDIAL MENISECTOMY ABRASION CHONDROPLASTY SYNOVECTOMY SUPRAPATELLER CUFF;  Surgeon: Tobi Bastos, MD;  Location: [facility];  Service: Orthopedics;  Laterality: Left;   Family History  Problem Relation Age of Onset  . Hypertension Mother   . Stroke Mother   . Cancer Father    Social History  Substance Use Topics  . Smoking status: Never Smoker   . Smokeless tobacco: Never Used  . Alcohol Use:  No   OB History    No data available     Review of Systems  All other systems reviewed and are negative.     Allergies  Crestor; Nsaids; Penicillins; and Vytorin  Home Medications   Prior to Admission medications   Medication Sig Start Date End Date Taking? Authorizing Provider  amLODipine (NORVASC) 10 MG tablet Take 1 tablet (10 mg total) by mouth daily. 10/12/15  Yes Fransisca Kaufmann Dettinger, MD  diphenhydrAMINE (BENADRYL) 25 MG tablet Take 50 mg by mouth at bedtime.   Yes Historical Provider, MD  Ferrous Sulfate 90 (18 FE) MG TABS Take 1 tablet by mouth every morning. 01/07/15  Yes Mary-Margaret Hassell Done, FNP  furosemide (LASIX) 20 MG tablet Take 1 tablet (20 mg total) by mouth daily as needed. Patient taking differently: Take 20 mg by mouth daily.  10/12/15  Yes Fransisca Kaufmann Dettinger, MD  HYDROcodone-acetaminophen ([place]) 7.5-325 MG tablet Take 1 tablet by mouth every 6 (six) hours as needed for moderate pain. Patient taking differently: Take 0.5-1 tablets by mouth every 6 (six) hours as needed for moderate pain.  10/22/15  Yes Timmothy Euler, MD  levothyroxine (SYNTHROID, LEVOTHROID) 100 MCG tablet Take 1 tablet (100 mcg total) by mouth daily before breakfast. 09/03/15  Yes Wardell Honour, MD  Multiple Vitamin (MULTIVITAMIN) tablet Take 1 tablet by mouth every morning.    Yes Historical Provider, MD  niacin 500 MG tablet Take 500 mg by  mouth at bedtime.   Yes Historical Provider, MD  omeprazole (PRILOSEC) 40 MG capsule Take 1 capsule (40 mg total) by mouth every morning. 01/07/15  Yes Mary-Margaret Hassell Done, FNP  sertraline (ZOLOFT) 100 MG tablet Take 1 tablet (100 mg total) by mouth every morning. 09/03/15  Yes Wardell Honour, MD  vitamin E 400 UNIT capsule Take 400 Units by mouth daily.   Yes Historical Provider, MD  artificial tears (LACRILUBE) OINT ophthalmic ointment Place into both eyes every 3 (three) hours as needed for dry eyes. 11/06/15   Noemi Chapel, MD  ciprofloxacin (CIPRO)  500 MG tablet Take 1 tablet (500 mg total) by mouth 2 (two) times daily. 11/06/15   Noemi Chapel, MD  sodium chloride (OCEAN) 0.65 % SOLN nasal spray Place 1 spray into both nostrils as needed for congestion.    Historical Provider, MD  valACYclovir (VALTREX) 1000 MG tablet Take 0.5 tablets (500 mg total) by mouth 2 (two) times daily. 11/06/15   Noemi Chapel, MD   BP 150/94 mmHg  Pulse 79  Temp(Src) 97.8 F (36.6 C) (Oral)  Resp 18  Ht 5\' 4"  (1.626 m)  Wt 157 lb (71.215 kg)  BMI 26.94 kg/m2  SpO2 99% Physical Exam  Constitutional: She appears well-developed and well-nourished. No distress.  HENT:  Head: Normocephalic and atraumatic.  Mouth/Throat: Oropharynx is clear and moist. No oropharyngeal exudate.  Eyes: Conjunctivae and EOM are normal. Pupils are equal, round, and reactive to light. Right eye exhibits no discharge. Left eye exhibits no discharge. No scleral icterus.  Neck: Normal range of motion. Neck supple. No JVD present. No thyromegaly present.  Cardiovascular: Normal rate, regular rhythm, normal heart sounds and intact distal pulses.  Exam reveals no gallop and no friction rub.   No murmur heard. Pulmonary/Chest: Effort normal and breath sounds normal. No respiratory distress. She has no wheezes. She has no rales.  Abdominal: Soft. Bowel sounds are normal. She exhibits no distension and no mass. There is no tenderness.  Musculoskeletal: Normal range of motion. She exhibits no edema or tenderness.  Lymphadenopathy:    She has no cervical adenopathy.  Neurological: She is alert. Coordination normal.  Right-sided facial droop, inability to completely close the right eye, otherwise normal strength and sensation in all 4 extremities, cranial nerves III through XII are otherwise intact, vision is grossly normal, coordination is normal by finger-nose-finger and heel shin bilaterally, gait is normal. Memory is normal.  Skin: Skin is warm and dry. No rash noted. No erythema.   Psychiatric: She has a normal mood and affect. Her behavior is normal.  Nursing note and vitals reviewed.   ED Course  Procedures (including critical care time) Labs Review Labs Reviewed  APTT - Abnormal; Notable for the following:    aPTT 38 (*)    All other components within normal limits  CBC - Abnormal; Notable for the following:    Hemoglobin 11.7 (*)    All other components within normal limits  COMPREHENSIVE METABOLIC PANEL - Abnormal; Notable for the following:    Chloride 99 (*)    Albumin 3.4 (*)    AST 14 (*)    ALT 13 (*)    All other components within normal limits  URINE RAPID DRUG SCREEN, HOSP PERFORMED - Abnormal; Notable for the following:    Opiates POSITIVE (*)    All other components within normal limits  URINALYSIS, ROUTINE W REFLEX MICROSCOPIC (NOT AT [facility]) - Abnormal; Notable for the following:    Nitrite  POSITIVE (*)    All other components within normal limits  URINE MICROSCOPIC-ADD ON - Abnormal; Notable for the following:    Squamous Epithelial / LPF MANY (*)    Bacteria, UA MANY (*)    All other components within normal limits  I-STAT CHEM 8, ED - Abnormal; Notable for the following:    Chloride 97 (*)    All other components within normal limits  URINE CULTURE  ETHANOL  PROTIME-INR  DIFFERENTIAL  I-STAT TROPOININ, ED    Imaging Review Ct Head Wo Contrast  11/06/2015  CLINICAL DATA:  Right-sided facial droop for 1 day EXAM: CT HEAD WITHOUT CONTRAST TECHNIQUE: Contiguous axial images were obtained from the base of the skull through the vertex without intravenous contrast. COMPARISON:  None. FINDINGS: The bony calvarium is intact. No gross soft tissue abnormality is noted. No findings to suggest acute hemorrhage, acute infarction or space-occupying mass lesion are noted. IMPRESSION: No acute abnormality seen. Electronically Signed   By: Inez Catalina M.D.   On: 11/06/2015 18:28   I have personally reviewed and evaluated these images and lab  results as part of my medical decision-making.   EKG Interpretation   Date/Time:  Saturday November 06 2015 17:32:44 EST Ventricular Rate:  72 PR Interval:  190 QRS Duration: 84 QT Interval:  383 QTC Calculation: 419 R Axis:   3 Text Interpretation:  Sinus rhythm Low voltage, precordial leads No old  tracing to compare Confirmed by Tara Rud  MD, Lorane ([id number]) on 11/06/2015  5:40:04 PM      MDM   Final diagnoses:  Bell's palsy  UTI (lower urinary tract infection)    The patient does not have sparing of the forehead, she has no elevation of the right side, this appears to be consistent with a Bell's palsy but given the patient's risk factors we'll obtain CT scan imaging and lab workup. The patient is in agreement with the plan.  On repeat exam the patient has a very classic Bell's palsy exam with the inability to close the right eye, inability to raise the right forehead, facial droop on the right. There is still no other neurologic findings, this is all consistent with a benign source, she will be treated with Valtrex as well as with artificial tears and recommended follow-up closely with her family doctor. All of the results were discussed with the patient and her family members. Labs and CT scan unremarkable, urinalysis does reveal urinary infection for which she will need antibiotic therapy. They have expressed her understanding.  , Meds given in ED:  Medications  ciprofloxacin (CIPRO) tablet 500 mg (not administered)    New Prescriptions   ARTIFICIAL TEARS (LACRILUBE) OINT OPHTHALMIC OINTMENT    Place into both eyes every 3 (three) hours as needed for dry eyes.   CIPROFLOXACIN (CIPRO) 500 MG TABLET    Take 1 tablet (500 mg total) by mouth 2 (two) times daily.   VALACYCLOVIR (VALTREX) 1000 MG TABLET    Take 0.5 tablets (500 mg total) by mouth 2 (two) times daily.       Noemi Chapel, MD 11/06/15 2105

## 2015-11-06 NOTE — ED Notes (Signed)
Having facial drooping to right side of face since yesterday.  Right eye drooping and watery.  Denies any pain.  Dr Sabra Heck at bedside.

## 2015-11-10 LAB — URINE CULTURE: Culture: >=100000

## 2015-11-11 ENCOUNTER — Telehealth (HOSPITAL_BASED_OUTPATIENT_CLINIC_OR_DEPARTMENT_OTHER): Payer: Self-pay | Admitting: Emergency Medicine

## 2015-11-11 NOTE — Telephone Encounter (Signed)
Post ED Visit - Positive Culture Follow-up  Culture report reviewed by antimicrobial stewardship pharmacist:  [x]  Elenor Quinones, Pharm.D. []  Heide Guile, Pharm.D., BCPS []  Parks Neptune, Pharm.D. []  Alycia Rossetti, Pharm.D., BCPS []  [place], Pharm.D., BCPS, AAHIVP []  Legrand Como, Pharm.D., BCPS, AAHIVP []  Milus Glazier, Pharm.D. []  Stephens November, [place].D.  Positive urine culture E. coli Treated with ciprofloxacin, organism sensitive to the same and no further patient follow-up is required at this time.  [name] 11/11/2015, 10:43 AM

## 2015-11-12 ENCOUNTER — Ambulatory Visit (INDEPENDENT_AMBULATORY_CARE_PROVIDER_SITE_OTHER): Payer: Medicare Other | Admitting: Family Medicine

## 2015-11-12 ENCOUNTER — Encounter: Payer: Self-pay | Admitting: Family Medicine

## 2015-11-12 VITALS — BP 112/61 | HR 60 | Temp 97.0°F | Ht 64.0 in | Wt 157.0 lb

## 2015-11-12 DIAGNOSIS — M1990 Unspecified osteoarthritis, unspecified site: Secondary | ICD-10-CM

## 2015-11-12 DIAGNOSIS — G894 Chronic pain syndrome: Secondary | ICD-10-CM | POA: Diagnosis not present

## 2015-11-12 DIAGNOSIS — G510 Bell's palsy: Secondary | ICD-10-CM | POA: Diagnosis not present

## 2015-11-12 MED ORDER — HYDROCODONE-ACETAMINOPHEN 7.5-325 MG PO TABS: 0.5000 | ORAL_TABLET | Freq: Four times a day (QID) | ORAL | Status: DC | PRN

## 2015-11-12 MED ORDER — FLUCONAZOLE 150 MG PO TABS: 150.0000 mg | ORAL_TABLET | Freq: Once | ORAL | Status: DC

## 2015-11-12 NOTE — Addendum Note (Signed)
Addended by: Timmothy Euler on: 11/12/2015 11:43 AM   Modules accepted: Orders

## 2015-11-12 NOTE — Progress Notes (Signed)
   HPI  Patient presents today here for ER follow-up.  She was seen in the ER about 6 days ago and diagnosed with Bell's palsy. She was evaluated with a CAT scan of the head which ruled out an ischemic stroke. She's been treated also for UTI with ciprofloxacin. She is currently on Valtrex for the Bell's palsy. She is feeling better overall but still has one small area of tenderness on her right temple area She has formed body sensation right eye has not been able to get the ointment that was prescribed for her eye, she's been using an over-the-counter lubricating ointment recommended by the pharmacist. She states that the ointment messes up her vision, and she feels like there are stones in her eye.  She states that her droop seems to got worse that she has no other symptoms other than the d  PMH: Smoking status noted ROS: Per HPI  Objective: BP 112/61 mmHg  Pulse 60  Temp(Src) 97 F (36.1 C) (Oral)  Ht 5\' 4"  (1.626 m)  Wt 157 lb (71.215 kg)  BMI 26.94 kg/m2 Gen: NAD, alert, cooperative with exam HEENT: NCAT,  eye closing completely but inconsistently CV: RRR, good S1/S2, no murmur Resp: CTABL, no wheezes, non-labored Ext: 1-2+ pitting edema BL LE Neuro: Alert and oriented, R facial droop, CN 2-12 intact otherwise  Assessment and plan:  # Bell's palsy Stable to improving, continue Valtrex. Discussed with her signs and symptoms of a stroke and recommended that she seek immediate medical care if any of these develop. Refilled her chronic pain medication  Recommended artificial tears for her eye symptoms.    Laroy Apple, MD [facility] 11/12/2015, 10:50 AM

## 2015-11-12 NOTE — Patient Instructions (Signed)
Great to see you!  Try artificial tears for your eyes  Bell Palsy Bell palsy is a condition in which the muscles on one side of the face become paralyzed. This often causes one side of the face to droop. It is a common condition and most people recover completely. RISK FACTORS Risk factors for Bell palsy include:  Pregnancy.  Diabetes.  An infection by a virus, such as infections that cause cold sores. CAUSES  Bell palsy is caused by damage to or inflammation of a nerve in your face. It is unclear why this happens, but an infection by a virus may lead to it. Most of the time the reason it happens is unknown. SIGNS AND SYMPTOMS  Symptoms can range from mild to severe and can take place over a number of hours. Symptoms may include:  Being unable to:  Raise one or both eyebrows.  Close one or both eyes.  Feel parts of your face (facial numbness).  Drooping of the eyelid and corner of the mouth.  Weakness in the face.  Paralysis of half your face.  Loss of taste.  Sensitivity to loud noises.  Difficulty chewing.  Tearing up of the affected eye.  Dryness in the affected eye.  Drooling.  Pain behind one ear. DIAGNOSIS  Diagnosis of Bell palsy may include:  A medical history and physical exam.  An MRI.  A CT scan.  Electromyography (EMG). This is a test that checks how your nerves are working. TREATMENT  Treatment may include antiviral medicine to help shorten the length of the condition. Sometimes treatment is not needed and the symptoms go away on their own. HOME CARE INSTRUCTIONS   Take medicines only as directed by your health care provider.  Do facial massages and exercises as directed by your health care provider.  If your eye is affected:  Use moisturizing eye drops to prevent drying of your eye as directed by your health care provider.  Protect your eye as directed by your health care provider. SEEK MEDICAL CARE IF:  Your symptoms do not get  better or get worse.  You are drooling.  Your eye is red, irritated, or hurts. SEEK IMMEDIATE MEDICAL CARE IF:   Another part of your body feels weak or numb.  You have difficulty swallowing.  You have a fever along with symptoms of Bell palsy.  You develop neck pain. MAKE SURE YOU:   Understand these instructions.  Will watch your condition.  Will get help right away if you are not doing well or get worse.   This information is not intended to replace advice given to you by your health care provider. Make sure you discuss any questions you have with your health care provider.   Document Released: 12/11/2005 Document Revised: 09/01/2015 Document Reviewed: 03/20/2014 Elsevier Interactive Patient Education [employer].

## 2015-11-22 ENCOUNTER — Ambulatory Visit (INDEPENDENT_AMBULATORY_CARE_PROVIDER_SITE_OTHER): Payer: Medicare Other | Admitting: Family Medicine

## 2015-11-22 ENCOUNTER — Ambulatory Visit (INDEPENDENT_AMBULATORY_CARE_PROVIDER_SITE_OTHER): Payer: Medicare Other

## 2015-11-22 ENCOUNTER — Encounter: Payer: Self-pay | Admitting: Family Medicine

## 2015-11-22 VITALS — BP 135/69 | HR 79 | Temp 97.1°F | Ht 64.0 in | Wt 160.0 lb

## 2015-11-22 DIAGNOSIS — S6991XA Unspecified injury of right wrist, hand and finger(s), initial encounter: Secondary | ICD-10-CM | POA: Diagnosis not present

## 2015-11-22 DIAGNOSIS — S6291XA Unspecified fracture of right wrist and hand, initial encounter for closed fracture: Secondary | ICD-10-CM | POA: Diagnosis not present

## 2015-11-22 DIAGNOSIS — G510 Bell's palsy: Secondary | ICD-10-CM | POA: Diagnosis not present

## 2015-11-22 DIAGNOSIS — S0083XA Contusion of other part of head, initial encounter: Secondary | ICD-10-CM | POA: Diagnosis not present

## 2015-11-22 NOTE — Patient Instructions (Addendum)
Keep the left hand elevated and keep with the Ace bandage for support Apply ice off and on the rest of the evening Also apply ice to the right cheek Keep appointment tomorrow with orthopedic surgeon and keep appointment with neurologist as already planned in the morning Take Tylenol as needed for severe pain

## 2015-11-22 NOTE — Progress Notes (Signed)
Subjective:    Patient ID: Danielle Parrish, female    DOB: April 22, 1941, 74 y.o.   MRN: [id number]  HPI Patient here today for a fall that happened earlier today. She fell after getting out of the car. She has pain in her right cheek and  face and right hand. She has a slight contusion of the right cheek and the dressing that had been applied before coming to the office was removed and the area was cleansed and a new dressing was applied. She has a remote history of Bell's palsy on the right side also. The hand was grossly swollen and the fractures were apparent on the x-ray. She is alert and cooperative and indicates that she is had problems with falling in the past and actually has an appointment with the neurologist Tamara morning in [place].      Patient Active Problem List   Diagnosis Date Noted  . Chronic pain syndrome 10/22/2015  . Headache 10/22/2015  . Bradycardia 09/14/2015  . Pleural effusion 09/14/2015  . OSA (obstructive sleep apnea) 09/14/2015  . Chronic diastolic heart failure ([place]) 09/10/2015  . RLQ abdominal pain 09/03/2015  . Orthopnea 09/02/2015  . Abdominal pain, epigastric 08/26/2015  . Loss of weight 08/04/2015  . Dysphagia, pharyngoesophageal phase 08/04/2015  . Anorexia 08/04/2015  . Fatigue 08/04/2015  . Malaise and fatigue 08/04/2015  . Depression 04/08/2015  . Osteoarthritis of left knee 09/30/2014  . Vitamin D deficiency   . Anxiety   . GERD (gastroesophageal reflux disease)   . Arthritis   . Chronic pain of multiple joints   . HTN (hypertension) 05/15/2013  . HLD (hyperlipidemia) 05/15/2013  . Prediabetes 05/15/2013  . Obesity 05/15/2013  . Hypothyroidism 05/15/2013   Outpatient Encounter Prescriptions as of 11/22/2015  Medication Sig  . amLODipine (NORVASC) 10 MG tablet Take 1 tablet (10 mg total) by mouth daily.  Marland Kitchen artificial tears (LACRILUBE) OINT ophthalmic ointment Place into both eyes every 3 (three) hours as needed for dry eyes.  .  ciprofloxacin (CIPRO) 500 MG tablet Take 1 tablet (500 mg total) by mouth 2 (two) times daily.  . diphenhydrAMINE (BENADRYL) 25 MG tablet Take 50 mg by mouth at bedtime.  . Ferrous Sulfate 90 (18 FE) MG TABS Take 1 tablet by mouth every morning.  . furosemide (LASIX) 20 MG tablet Take 1 tablet (20 mg total) by mouth daily as needed. (Patient taking differently: Take 20 mg by mouth daily. )  . HYDROcodone-acetaminophen (NORCO) 7.5-325 MG tablet Take 0.5-1 tablets by mouth every 6 (six) hours as needed for moderate pain.  Marland Kitchen levothyroxine (SYNTHROID, LEVOTHROID) 100 MCG tablet Take 1 tablet (100 mcg total) by mouth daily before breakfast.  . Multiple Vitamin (MULTIVITAMIN) tablet Take 1 tablet by mouth every morning.   . niacin 500 MG tablet Take 500 mg by mouth at bedtime.  Marland Kitchen omeprazole (PRILOSEC) 40 MG capsule Take 1 capsule (40 mg total) by mouth every morning.  . sertraline (ZOLOFT) 100 MG tablet Take 1 tablet (100 mg total) by mouth every morning.  . sodium chloride (OCEAN) 0.65 % SOLN nasal spray Place 1 spray into both nostrils as needed for congestion.  . valACYclovir (VALTREX) 1000 MG tablet Take 0.5 tablets (500 mg total) by mouth 2 (two) times daily.  . vitamin E 400 UNIT capsule Take 400 Units by mouth daily.  . [DISCONTINUED] fluconazole (DIFLUCAN) 150 MG tablet Take 1 tablet (150 mg total) by mouth once. And repeat in 3 days   No  facility-administered encounter medications on file as of 11/22/2015.      Review of Systems  Constitutional: Negative.   HENT: Negative.   Eyes: Negative.   Respiratory: Negative.   Cardiovascular: Negative.   Gastrointestinal: Negative.   Endocrine: Negative.   Genitourinary: Negative.   Musculoskeletal: Positive for arthralgias (right hand, face).  Skin: Negative.   Allergic/Immunologic: Negative.   Neurological: Negative.   Hematological: Negative.   Psychiatric/Behavioral: Negative.        Objective:   Physical Exam  Constitutional: She  is oriented to person, place, and time. She appears well-developed and well-nourished. No distress.  The patient was alert and cooperative  HENT:  She has facial asymmetry secondary to Bell's palsy from the past. She has a contusion of the right orbit secondary to the fall and this area was cleansed and a new dressing was applied the patient said she is up-to-date on her tetanus shots.  Eyes: Conjunctivae and EOM are normal. Pupils are equal, round, and reactive to light. Right eye exhibits no discharge. Left eye exhibits no discharge. No scleral icterus.  Neck: Normal range of motion.  Cardiovascular: Normal rate, regular rhythm and normal heart sounds.   No murmur heard. Pulmonary/Chest: Effort normal and breath sounds normal. She has no wheezes. She has no rales.  Musculoskeletal: She exhibits edema and tenderness.  The patient has a significant amount of swelling and bruising to the right hand secondary to the fractures in the third and fourth proximal metacarpals  Neurological: She is alert and oriented to person, place, and time.  Skin: Skin is warm and dry. No rash noted.  Psychiatric: She has a normal mood and affect. Her behavior is normal. Judgment and thought content normal.  Nursing note and vitals reviewed.   BP 135/69 mmHg  Pulse 79  Temp(Src) 97.1 F (36.2 C) (Oral)  Ht 5\' 4"  (1.626 m)  Wt 160 lb (72.576 kg)  BMI 27.45 kg/m2  [facility] reading (PRIMARY) by  Dr. Camillo Flaming to the third and fourth proximal metacarpal                                       Assessment & Plan:   1. Hand injury, right, initial encounter -Ice and elevation - DG Hand Complete Right; Future - Ambulatory referral to Orthopedic Surgery  2. Facial contusion, initial encounter -Ice off and on this evening to keep swelling down  3. Bell's palsy -There is a history of Bell's palsy on the right side  4. Fracture of right hand, closed, initial encounter -Ice and elevation and Ace  bandage -Appointment with orthopedics tomorrow  Patient Instructions  Keep the left hand elevated and keep with the Ace bandage for support Apply ice off and on the rest of the evening Also apply ice to the right cheek Keep appointment tomorrow with orthopedic surgeon and keep appointment with neurologist as already planned in the morning Take Tylenol as needed for severe pain   Arrie Senate MD

## 2015-11-23 ENCOUNTER — Encounter: Payer: Self-pay | Admitting: Neurology

## 2015-11-23 ENCOUNTER — Ambulatory Visit (INDEPENDENT_AMBULATORY_CARE_PROVIDER_SITE_OTHER): Payer: Medicare Other | Admitting: Neurology

## 2015-11-23 VITALS — BP 132/76 | HR 84 | Ht 64.0 in | Wt 161.8 lb

## 2015-11-23 DIAGNOSIS — R51 Headache: Secondary | ICD-10-CM | POA: Diagnosis not present

## 2015-11-23 DIAGNOSIS — R296 Repeated falls: Secondary | ICD-10-CM

## 2015-11-23 DIAGNOSIS — R2681 Unsteadiness on feet: Secondary | ICD-10-CM | POA: Diagnosis not present

## 2015-11-23 DIAGNOSIS — M542 Cervicalgia: Secondary | ICD-10-CM

## 2015-11-23 DIAGNOSIS — R519 Headache, unspecified: Secondary | ICD-10-CM

## 2015-11-23 DIAGNOSIS — G510 Bell's palsy: Secondary | ICD-10-CM

## 2015-11-23 DIAGNOSIS — M79641 Pain in right hand: Secondary | ICD-10-CM | POA: Diagnosis not present

## 2015-11-23 DIAGNOSIS — S62644A Nondisplaced fracture of proximal phalanx of right ring finger, initial encounter for closed fracture: Secondary | ICD-10-CM | POA: Diagnosis not present

## 2015-11-23 DIAGNOSIS — M7989 Other specified soft tissue disorders: Secondary | ICD-10-CM | POA: Diagnosis not present

## 2015-11-23 MED ORDER — CYCLOBENZAPRINE HCL 5 MG PO TABS: 5.0000 mg | ORAL_TABLET | Freq: Three times a day (TID) | ORAL | Status: DC | PRN

## 2015-11-23 MED ORDER — GABAPENTIN 100 MG PO CAPS: ORAL_CAPSULE | ORAL | Status: DC

## 2015-11-23 NOTE — Addendum Note (Signed)
Addended by: Gerda Diss A on: 11/23/2015 10:59 AM   Modules accepted: Orders

## 2015-11-23 NOTE — Patient Instructions (Addendum)
1.  For headaches, start gabapentin 100mg  at bedtime for 7 days, then 100mg  twice daily.  Caution as may cause drowsiness or dizziness. 2.  Take cyclobenzaprine 5mg  when you develop neck pain.  Do not take more than three times daily as needed. 3.  Will get MRI of brain without contrast for new-onset headaches in patient over 50, frequent falls, gait instability 4.  Call in 3 or 4 weeks with update.  Follow up in 3 months.

## 2015-11-23 NOTE — Progress Notes (Signed)
NEUROLOGY CONSULTATION NOTE  [name] MRN: [id number] DOB: [birth year]  Referring provider: Dr. Warrick Parisian Primary care provider: Dr. Wendi Snipes  Reason for consult:  Headache  HISTORY OF PRESENT ILLNESS: Danielle Parrish is a 74 year old right-handed female with chronic knee pain, hypertension and hyperlipidemia who presents for headache.  History obtained by patient, her daughter, ED note and PCP note.  Labs and images of head CT reviewed.     She began having multiple medical problems in August, including dysphagia and decreased appetite, which was evaluated by GI with an unremarkable workup.  She was also evaluated by cardiology for sinus bradycardia in the 30s.  Workup, including Echo and holter, was also unremarkable and her HR has been normal.  She has been treated for sinus infection.  More recently, she has had edema in the feet and ankles and currently is on Lasix.  Around this same time, she developed headaches.  They are located in both temples (right worse than left), and back of head on left.  It is a 10/10 shooting pain that lasts up to 3 to 5 days.  They occur 3 or 4 times a month.  It is preceded by neck pain.  There is no associated nausea, photophobia, phonophobia or visual disturbance.  She takes hydrocodone daily for at least a couple of years for knee pain.  She denies prior history of headaches.    Over the past couple of years, she also reports gait instability and falls.  She says she just topples over.  She recently fell and fractured her ribs and right arm.  On 11/05/15 she developed right sided facial droop.  The next morning, she went to the ED for evaluation, where CT of the head was unremarkable.  UA did reveal UTI.  She was started on Valcyclovir for presumed Bell's palsy and Cipro for UTI.    Recent Sed Rate was 18.    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 Essential hypertension   . Osteoporosis   .  Hyperlipidemia   . Vitamin D deficiency   . Anxiety   . GERD (gastroesophageal reflux disease)   . Arthritis   . Chronic pain of multiple joints   . Sleep apnea     CPAP  . Hypothyroidism   . Depression     PAST SURGICAL HISTORY: Past Surgical History  Procedure Laterality Date  . Abdominal hernia repair    . Abdominal hysterectomy  1977  . Knee arthroscopy Left 09/30/2014    Procedure: LEFT KNEE ARTHROSCOPY MEDIAL MENISECTOMY ABRASION CHONDROPLASTY SYNOVECTOMY SUPRAPATELLER CUFF;  Surgeon: Tobi Bastos, MD;  Location: [facility];  Service: Orthopedics;  Laterality: Left;    MEDICATIONS: Current Outpatient Prescriptions on File Prior to Visit  Medication Sig Dispense Refill  . amLODipine (NORVASC) 10 MG tablet Take 1 tablet (10 mg total) by mouth daily. 90 tablet 3  . artificial tears (LACRILUBE) OINT ophthalmic ointment Place into both eyes every 3 (three) hours as needed for dry eyes. 3.5 g 1  . ciprofloxacin (CIPRO) 500 MG tablet Take 1 tablet (500 mg total) by mouth 2 (two) times daily. 14 tablet 0  . diphenhydrAMINE (BENADRYL) 25 MG tablet Take 50 mg by mouth at bedtime.    . Ferrous Sulfate 90 (18 FE) MG TABS Take 1 tablet by mouth every morning. 90 each 1  . furosemide (LASIX) 20 MG tablet Take 1 tablet (20 mg total)  by mouth daily as needed. (Patient taking differently: Take 20 mg by mouth daily. ) 30 tablet 2  . HYDROcodone-acetaminophen (NORCO) 7.5-325 MG tablet Take 0.5-1 tablets by mouth every 6 (six) hours as needed for moderate pain. 45 tablet 0  . levothyroxine (SYNTHROID, LEVOTHROID) 100 MCG tablet Take 1 tablet (100 mcg total) by mouth daily before breakfast. 90 tablet 1  . Multiple Vitamin (MULTIVITAMIN) tablet Take 1 tablet by mouth every morning.     . niacin 500 MG tablet Take 500 mg by mouth at bedtime.    Marland Kitchen omeprazole (PRILOSEC) 40 MG capsule Take 1 capsule (40 mg total) by mouth every morning. 90 capsule 1  . sertraline (ZOLOFT) 100 MG tablet Take 1 tablet  (100 mg total) by mouth every morning. 90 tablet 1  . sodium chloride (OCEAN) 0.65 % SOLN nasal spray Place 1 spray into both nostrils as needed for congestion.    . valACYclovir (VALTREX) 1000 MG tablet Take 0.5 tablets (500 mg total) by mouth 2 (two) times daily. 20 tablet 0  . vitamin E 400 UNIT capsule Take 400 Units by mouth daily.     No current facility-administered medications on file prior to visit.    ALLERGIES: Allergies  Allergen Reactions  . Crestor [Rosuvastatin] Other (See Comments)    Body aches.   . Nsaids     Burns stomach.   . Penicillins Hives    Has patient had a PCN reaction causing immediate rash, facial/tongue/throat swelling, SOB or lightheadedness with hypotension: Yes Has patient had a PCN reaction causing severe rash involving mucus membranes or skin necrosis: unknown Has patient had a PCN reaction that required hospitalization No Has patient had a PCN reaction occurring within the last 10 years: No If all of the above answers are "NO", then may proceed with Cephalosporin use.   . Vytorin [Ezetimibe-Simvastatin]     Body aches.     FAMILY HISTORY: Family History  Problem Relation Age of Onset  . Hypertension Mother   . Stroke Mother   . Cancer Father     SOCIAL HISTORY: Social History   Social History  . Marital Status: Married    Spouse Name: N/A  . Number of Children: N/A  . Years of Education: N/A   Occupational History  . Not on file.   Social History Main Topics  . Smoking status: Never Smoker   . Smokeless tobacco: Never Used  . Alcohol Use: No  . Drug Use: No  . Sexual Activity: Not on file   Other Topics Concern  . Not on file   Social History Narrative   Pt lives with grandson and wife. Does have stairs into the basement. Pt completed 12th grade.     REVIEW OF SYSTEMS: Constitutional: fatigue Eyes: No visual changes, double vision, eye pain Ear, nose and throat: No hearing loss, ear pain, nasal congestion, sore  throat Cardiovascular: No chest pain, palpitations Respiratory:  No shortness of breath at rest or with exertion, wheezes GastrointestinaI: nausea, decreased appetite Genitourinary:  No dysuria, urinary retention or frequency Musculoskeletal:  Neck and knee pain Integumentary: No rash, pruritus, skin lesions Neurological: as above Psychiatric: No depression, insomnia, anxiety Allergic/Immunologic: no itchy/runny eyes, nasal congestion, recent allergic reactions, rashes  PHYSICAL EXAM: Filed Vitals:   11/23/15 1002  BP: 132/76  Pulse: 84   General: No acute distress.  Patient appears well-groomed.  Head:  Normocephalic/atraumatic Eyes:  fundi unremarkable, without vessel changes, exudates, hemorrhages or papilledema. Neck: supple, no paraspinal  tenderness, full range of motion Back: No paraspinal tenderness Heart: regular rate and rhythm Lungs: Clear to auscultation bilaterally. Vascular: No carotid bruits. Neurological Exam: Mental status: alert and oriented to person, place, and time, recent and remote memory intact, fund of knowledge intact, attention and concentration intact, speech fluent and not dysarthric, language intact. Cranial nerves: CN I: not tested CN II: pupils equal, round and reactive to light, visual fields intact, fundi unremarkable, without vessel changes, exudates, hemorrhages or papilledema. CN III, IV, VI:  full range of motion, no nystagmus, no ptosis CN V: facial sensation intact CN VII: right upper and lower facial weakness CN VIII: hearing intact CN IX, X: gag intact, uvula midline CN XI: sternocleidomastoid and trapezius muscles intact CN XII: tongue midline Bulk & Tone: normal, no fasciculations. Motor:  5/5 throughout (unable to assess right upper extremity as it is in a cast and sling) Sensation:  Pinprick sensation inconsistent in feet. Vibration sensation intact. Deep Tendon Reflexes:  2+ throughout, toes downgoing.  Finger to nose testing:   Without dysmetria.  Heel to shin:  Without dysmetria.  Gait:  Mildly wide-based and cautious.  Able to turn, unable tandem walk. Romberg negative.  IMPRESSION: New onset headache in patient over 73 with no prior history of headache Gait instability and frequent falls.  She does not exhibit any focal weakness, signs of myelopathy or significant neuropathy.  I have no explanation at this time Right sided Bell's palsy  PLAN: 1.  Given the new-onset headaches in patient over 50, gait instability and frequent falls, we will get MRI of brain. 2.  For headache prevention, will start gabapentin 100mg  at bedtime for 7 days, then increase to 100mg  twice daily 3.  For neck pain (which may be a trigger for headache), will take cyclobenzaprine 5mg  (but caution strongly advised given side effects of drowsiness) 4.  Informed about rebound headache and limiting hydrocodone if possible. 5.  Follow up in 3 months.  Thank you for allowing me to take part in the care of this patient.  Metta Clines, DO  CC:  Kenn File, MD  Caryl Pina, MD

## 2015-11-27 DIAGNOSIS — S62644D Nondisplaced fracture of proximal phalanx of right ring finger, subsequent encounter for fracture with routine healing: Secondary | ICD-10-CM | POA: Diagnosis not present

## 2015-12-02 ENCOUNTER — Ambulatory Visit (INDEPENDENT_AMBULATORY_CARE_PROVIDER_SITE_OTHER): Payer: Medicare Other | Admitting: Family Medicine

## 2015-12-02 ENCOUNTER — Inpatient Hospital Stay: Admission: RE | Admit: 2015-12-02 | Payer: Medicare Other | Source: Ambulatory Visit

## 2015-12-02 ENCOUNTER — Encounter: Payer: Self-pay | Admitting: Family Medicine

## 2015-12-02 VITALS — BP 126/73 | HR 72 | Temp 98.1°F | Ht 64.0 in | Wt 160.2 lb

## 2015-12-02 DIAGNOSIS — M79621 Pain in right upper arm: Secondary | ICD-10-CM | POA: Diagnosis not present

## 2015-12-02 DIAGNOSIS — M25521 Pain in right elbow: Secondary | ICD-10-CM

## 2015-12-02 NOTE — Progress Notes (Addendum)
   HPI  Patient presents today here to discuss cast presure on her right arm and need for CPAP  Patient had an arm fracture and was casted fo, she complains of mild R 5th digit pain and pressure from the cast for 2-3 days. She has ahd the cast re-casted once by ortho due to writs pain.   She has been placing cottin balls in the cast since it began  She also complains of sleep apnea and is following up since her CPAP is still not arranged.   PMH: Smoking status noted ROS: Per HPI  Objective: BP 126/73 mmHg  Pulse 72  Temp(Src) 98.1 F (36.7 C) (Oral)  Ht 5\' 4"  (1.626 m)  Wt 160 lb 3.2 oz (72.666 kg)  BMI 27.48 kg/m2 Gen: NAD, alert, cooperative with exam HEENT: NCAT CV: RRR, good S1/S2, no murmur Resp: CTABL, no wheezes, non-labored Abd: SNTND, BS present, no guarding or organomegaly Ext: 1 + pitting edema BL Neuro: Alert and oriented, No gross deficits  R arm- 6-8 cottoin balls removed and the afinger of concern is visualized well without erythema or damage to thge skin, no tenderness to palpation on the finger at the site of concern  Assessment and plan:  # Cast related pain The area is well visualized and is intact and healthy appearing, recommended not packing cotton balls Call on call ortho or ED if worsening pain   # OSA Will follow up on CPAP order, poending with home health    Laroy Apple, MD [facility] 12/02/2015, 5:15 PM   Addendum  She has used CPAP in the past for OSA and gets very good benefit from it. She currently is suffering with decreased sleep since not having her CPAP. I feel she needs a CPAP to help her sleep and treat her OSA.   Laroy Apple, MD [facility] 12/13/2015, 12:50 PM

## 2015-12-03 NOTE — Progress Notes (Signed)
Ordered today, was waiting for her new insurance to start.

## 2015-12-09 DIAGNOSIS — S62642D Nondisplaced fracture of proximal phalanx of right middle finger, subsequent encounter for fracture with routine healing: Secondary | ICD-10-CM | POA: Diagnosis not present

## 2015-12-09 DIAGNOSIS — S62644D Nondisplaced fracture of proximal phalanx of right ring finger, subsequent encounter for fracture with routine healing: Secondary | ICD-10-CM | POA: Diagnosis not present

## 2015-12-09 DIAGNOSIS — M25641 Stiffness of right hand, not elsewhere classified: Secondary | ICD-10-CM | POA: Diagnosis not present

## 2015-12-09 DIAGNOSIS — M7989 Other specified soft tissue disorders: Secondary | ICD-10-CM | POA: Diagnosis not present

## 2015-12-13 ENCOUNTER — Other Ambulatory Visit: Payer: Medicare Other

## 2015-12-14 ENCOUNTER — Telehealth: Payer: Self-pay | Admitting: Family Medicine

## 2015-12-14 NOTE — Telephone Encounter (Signed)
Patient states that [facility] called and said they would be bringing her cpap machine and they never showed up and when she called them today they stated they didn't know what she was talking about. Can you help patient I see order from September

## 2015-12-15 DIAGNOSIS — M25641 Stiffness of right hand, not elsewhere classified: Secondary | ICD-10-CM | POA: Diagnosis not present

## 2015-12-15 DIAGNOSIS — M79641 Pain in right hand: Secondary | ICD-10-CM | POA: Diagnosis not present

## 2015-12-15 DIAGNOSIS — S62642D Nondisplaced fracture of proximal phalanx of right middle finger, subsequent encounter for fracture with routine healing: Secondary | ICD-10-CM | POA: Diagnosis not present

## 2015-12-15 DIAGNOSIS — S62644D Nondisplaced fracture of proximal phalanx of right ring finger, subsequent encounter for fracture with routine healing: Secondary | ICD-10-CM | POA: Diagnosis not present

## 2015-12-15 NOTE — Telephone Encounter (Signed)
Pt will never answer her phone and doesn't have machine. Her cpap is coming from [employer], not [place]. I have tried to contact her for weeks, even her grandson, he said, he couldn't help just keep trying

## 2015-12-16 ENCOUNTER — Telehealth: Payer: Self-pay | Admitting: Family Medicine

## 2015-12-17 NOTE — Telephone Encounter (Signed)
Spoke with pt, all is now clarified

## 2015-12-21 DIAGNOSIS — M25641 Stiffness of right hand, not elsewhere classified: Secondary | ICD-10-CM | POA: Diagnosis not present

## 2015-12-21 DIAGNOSIS — M79641 Pain in right hand: Secondary | ICD-10-CM | POA: Diagnosis not present

## 2015-12-21 DIAGNOSIS — S62642D Nondisplaced fracture of proximal phalanx of right middle finger, subsequent encounter for fracture with routine healing: Secondary | ICD-10-CM | POA: Diagnosis not present

## 2015-12-21 DIAGNOSIS — S62644D Nondisplaced fracture of proximal phalanx of right ring finger, subsequent encounter for fracture with routine healing: Secondary | ICD-10-CM | POA: Diagnosis not present

## 2015-12-22 DIAGNOSIS — S62642D Nondisplaced fracture of proximal phalanx of right middle finger, subsequent encounter for fracture with routine healing: Secondary | ICD-10-CM | POA: Diagnosis not present

## 2015-12-22 DIAGNOSIS — M79641 Pain in right hand: Secondary | ICD-10-CM | POA: Diagnosis not present

## 2015-12-22 DIAGNOSIS — S62644D Nondisplaced fracture of proximal phalanx of right ring finger, subsequent encounter for fracture with routine healing: Secondary | ICD-10-CM | POA: Diagnosis not present

## 2015-12-22 DIAGNOSIS — M25641 Stiffness of right hand, not elsewhere classified: Secondary | ICD-10-CM | POA: Diagnosis not present

## 2015-12-23 ENCOUNTER — Ambulatory Visit
Admission: RE | Admit: 2015-12-23 | Discharge: 2015-12-23 | Disposition: A | Discharge status: Home / Self Care | Payer: Medicare Other | Source: Ambulatory Visit | Attending: Neurology | Admitting: Neurology

## 2015-12-23 DIAGNOSIS — R51 Headache: Principal | ICD-10-CM

## 2015-12-23 DIAGNOSIS — R269 Unspecified abnormalities of gait and mobility: Secondary | ICD-10-CM | POA: Diagnosis not present

## 2015-12-23 DIAGNOSIS — R2681 Unsteadiness on feet: Secondary | ICD-10-CM

## 2015-12-23 DIAGNOSIS — R519 Headache, unspecified: Secondary | ICD-10-CM

## 2015-12-23 DIAGNOSIS — R296 Repeated falls: Secondary | ICD-10-CM

## 2015-12-28 ENCOUNTER — Other Ambulatory Visit: Payer: Self-pay | Admitting: Family Medicine

## 2015-12-28 ENCOUNTER — Encounter: Payer: Self-pay | Admitting: *Deleted

## 2015-12-28 DIAGNOSIS — M79641 Pain in right hand: Secondary | ICD-10-CM | POA: Diagnosis not present

## 2015-12-28 DIAGNOSIS — M1990 Unspecified osteoarthritis, unspecified site: Secondary | ICD-10-CM

## 2015-12-28 DIAGNOSIS — M25641 Stiffness of right hand, not elsewhere classified: Secondary | ICD-10-CM | POA: Diagnosis not present

## 2015-12-28 DIAGNOSIS — S62642D Nondisplaced fracture of proximal phalanx of right middle finger, subsequent encounter for fracture with routine healing: Secondary | ICD-10-CM | POA: Diagnosis not present

## 2015-12-28 DIAGNOSIS — S62644D Nondisplaced fracture of proximal phalanx of right ring finger, subsequent encounter for fracture with routine healing: Secondary | ICD-10-CM | POA: Diagnosis not present

## 2015-12-28 NOTE — Telephone Encounter (Signed)
Last filled 11/12/15, last seen 12/02/15. Rx will print

## 2015-12-28 NOTE — Telephone Encounter (Signed)
Needs an appointment to get refill of hydrocodone, we have discussed this before.   Laroy Apple, MD [facility] 12/28/2015, 4:26 PM

## 2015-12-29 DIAGNOSIS — S62644D Nondisplaced fracture of proximal phalanx of right ring finger, subsequent encounter for fracture with routine healing: Secondary | ICD-10-CM | POA: Diagnosis not present

## 2015-12-29 DIAGNOSIS — S62642D Nondisplaced fracture of proximal phalanx of right middle finger, subsequent encounter for fracture with routine healing: Secondary | ICD-10-CM | POA: Diagnosis not present

## 2015-12-29 DIAGNOSIS — M79641 Pain in right hand: Secondary | ICD-10-CM | POA: Diagnosis not present

## 2015-12-29 DIAGNOSIS — M25641 Stiffness of right hand, not elsewhere classified: Secondary | ICD-10-CM | POA: Diagnosis not present

## 2015-12-29 NOTE — Telephone Encounter (Signed)
Aware, must have an office visit to have refill on pain script.

## 2016-01-05 DIAGNOSIS — M79641 Pain in right hand: Secondary | ICD-10-CM | POA: Diagnosis not present

## 2016-01-05 DIAGNOSIS — S62642D Nondisplaced fracture of proximal phalanx of right middle finger, subsequent encounter for fracture with routine healing: Secondary | ICD-10-CM | POA: Diagnosis not present

## 2016-01-05 DIAGNOSIS — S62644D Nondisplaced fracture of proximal phalanx of right ring finger, subsequent encounter for fracture with routine healing: Secondary | ICD-10-CM | POA: Diagnosis not present

## 2016-01-05 DIAGNOSIS — M25641 Stiffness of right hand, not elsewhere classified: Secondary | ICD-10-CM | POA: Diagnosis not present

## 2016-01-06 DIAGNOSIS — M7989 Other specified soft tissue disorders: Secondary | ICD-10-CM | POA: Diagnosis not present

## 2016-01-06 DIAGNOSIS — S62642D Nondisplaced fracture of proximal phalanx of right middle finger, subsequent encounter for fracture with routine healing: Secondary | ICD-10-CM | POA: Diagnosis not present

## 2016-01-06 DIAGNOSIS — M79641 Pain in right hand: Secondary | ICD-10-CM | POA: Diagnosis not present

## 2016-01-06 DIAGNOSIS — M25641 Stiffness of right hand, not elsewhere classified: Secondary | ICD-10-CM | POA: Diagnosis not present

## 2016-01-06 DIAGNOSIS — S6991XD Unspecified injury of right wrist, hand and finger(s), subsequent encounter: Secondary | ICD-10-CM | POA: Diagnosis not present

## 2016-01-06 DIAGNOSIS — S62644D Nondisplaced fracture of proximal phalanx of right ring finger, subsequent encounter for fracture with routine healing: Secondary | ICD-10-CM | POA: Diagnosis not present

## 2016-01-07 ENCOUNTER — Other Ambulatory Visit: Payer: Self-pay | Admitting: Family Medicine

## 2016-01-10 DIAGNOSIS — M79641 Pain in right hand: Secondary | ICD-10-CM | POA: Diagnosis not present

## 2016-01-10 DIAGNOSIS — S62644D Nondisplaced fracture of proximal phalanx of right ring finger, subsequent encounter for fracture with routine healing: Secondary | ICD-10-CM | POA: Diagnosis not present

## 2016-01-10 DIAGNOSIS — M25641 Stiffness of right hand, not elsewhere classified: Secondary | ICD-10-CM | POA: Diagnosis not present

## 2016-01-10 DIAGNOSIS — S62642D Nondisplaced fracture of proximal phalanx of right middle finger, subsequent encounter for fracture with routine healing: Secondary | ICD-10-CM | POA: Diagnosis not present

## 2016-01-11 ENCOUNTER — Encounter: Payer: Self-pay | Admitting: Family Medicine

## 2016-01-11 ENCOUNTER — Ambulatory Visit (INDEPENDENT_AMBULATORY_CARE_PROVIDER_SITE_OTHER): Payer: Medicare Other | Admitting: Family Medicine

## 2016-01-11 VITALS — BP 128/70 | HR 57 | Temp 97.1°F | Ht 64.0 in | Wt 163.8 lb

## 2016-01-11 DIAGNOSIS — S62642D Nondisplaced fracture of proximal phalanx of right middle finger, subsequent encounter for fracture with routine healing: Secondary | ICD-10-CM | POA: Diagnosis not present

## 2016-01-11 DIAGNOSIS — M10072 Idiopathic gout, left ankle and foot: Secondary | ICD-10-CM | POA: Diagnosis not present

## 2016-01-11 DIAGNOSIS — S62644D Nondisplaced fracture of proximal phalanx of right ring finger, subsequent encounter for fracture with routine healing: Secondary | ICD-10-CM | POA: Diagnosis not present

## 2016-01-11 DIAGNOSIS — M25641 Stiffness of right hand, not elsewhere classified: Secondary | ICD-10-CM | POA: Diagnosis not present

## 2016-01-11 DIAGNOSIS — M109 Gout, unspecified: Secondary | ICD-10-CM | POA: Insufficient documentation

## 2016-01-11 DIAGNOSIS — M1990 Unspecified osteoarthritis, unspecified site: Secondary | ICD-10-CM | POA: Diagnosis not present

## 2016-01-11 DIAGNOSIS — M79641 Pain in right hand: Secondary | ICD-10-CM | POA: Diagnosis not present

## 2016-01-11 MED ORDER — HYDROCODONE-ACETAMINOPHEN 7.5-325 MG PO TABS: 0.5000 | ORAL_TABLET | Freq: Four times a day (QID) | ORAL | Status: DC | PRN

## 2016-01-11 MED ORDER — FUROSEMIDE 20 MG PO TABS
20.0000 mg | ORAL_TABLET | Freq: Every day | ORAL | Status: DC
Start: 2016-01-11 — End: 2017-03-31

## 2016-01-11 MED ORDER — GABAPENTIN 100 MG PO CAPS: ORAL_CAPSULE | ORAL | Status: DC

## 2016-01-11 MED ORDER — PREDNISONE 20 MG PO TABS: 40.0000 mg | ORAL_TABLET | Freq: Every day | ORAL | Status: DC

## 2016-01-11 NOTE — Progress Notes (Signed)
   HPI  Patient presents today here today to get a refill on chronic narcotics as well as left foot pain.  Left foot pain Reports 4 days of left ankle swelling, redness, and warmth. She had no injury or accident. She does not have a history of gout. She states that it hurts worse to walk on it, however she can move it and use it completely, several limitations walking with pain.  Chronic pain Chronic left knee pain She takes 0.5-1 pills of hydrocodone daily to help the pain. She does have days when she does not need medication She states that NSAIDs give her dyspepsia. He did have a narcotic prescription given by orthopedic surgery in the interim, however she came to the office and discuss this with me previously.   PMH: Smoking status noted ROS: Per HPI  Objective: BP 128/70 mmHg  Pulse 57  Temp(Src) 97.1 F (36.2 C) (Oral)  Ht 5\' 4"  (1.626 m)  Wt 163 lb 12.8 oz (74.299 kg)  BMI 28.10 kg/m2 Gen: NAD, alert, cooperative with exam HEENT: NCAT, mucous membranes appear dry Neck: No tender lymphadenopathy CV: RRR, good S1/S2, no murmur Resp: CTABL, no wheezes, non-labored Ext: Left foot slightly erythematous compared to the right with some nonpitting swelling and tenderness of palpation of the ankle, normal range of motion and no joint laxity Neuro: Alert and oriented, No gross deficits  Assessment and plan:  # Chronic pain syndrome Refilled hydrocodone She has started gabapentin and feels that it's helping, refill  # Ankle pain History and clinical exam most consistent with gout Treat with prednisone No concern for injury, no ligamentous normality Voiding NSAIDs given that she has dyspepsia with them    Meds ordered this encounter  Medications  . predniSONE (DELTASONE) 20 MG tablet    Sig: Take 2 tablets (40 mg total) by mouth daily with breakfast.    Dispense:  14 tablet    Refill:  0  . HYDROcodone-acetaminophen (NORCO) 7.5-325 MG tablet    Sig: Take 0.5-1  tablets by mouth every 6 (six) hours as needed for moderate pain.    Dispense:  45 tablet    Refill:  0    Please do not fill until 30 days after the previous fill.  . gabapentin (NEURONTIN) 100 MG capsule    Sig: Take 1cap at bedtime for 7 days, then 1 cap twice daily    Dispense:  60 capsule    Refill:  3  . furosemide (LASIX) 20 MG tablet    Sig: Take 1 tablet (20 mg total) by mouth daily.    Dispense:  30 tablet    Refill:  [place], MD [facility] 01/11/2016, 8:39 AM

## 2016-01-11 NOTE — Patient Instructions (Signed)
Great to see you!  Take prednisone for gout  Lets see you back in 3-6 weeks  Gout Gout is an inflammatory arthritis caused by a buildup of uric acid crystals in the joints. Uric acid is a chemical that is normally present in the blood. When the level of uric acid in the blood is too high it can form crystals that deposit in your joints and tissues. This causes joint redness, soreness, and swelling (inflammation). Repeat attacks are common. Over time, uric acid crystals can form into masses (tophi) near a joint, destroying bone and causing disfigurement. Gout is treatable and often preventable. CAUSES  The disease begins with elevated levels of uric acid in the blood. Uric acid is produced by your body when it breaks down a naturally found substance called purines. Certain foods you eat, such as meats and fish, contain high amounts of purines. Causes of an elevated uric acid level include:  Being passed down from parent to child (heredity).  Diseases that cause increased uric acid production (such as obesity, psoriasis, and certain cancers).  Excessive alcohol use.  Diet, especially diets rich in meat and seafood.  Medicines, including certain cancer-fighting medicines (chemotherapy), water pills (diuretics), and aspirin.  Chronic kidney disease. The kidneys are no longer able to remove uric acid well.  Problems with metabolism. Conditions strongly associated with gout include:  Obesity.  High blood pressure.  High cholesterol.  Diabetes. Not everyone with elevated uric acid levels gets gout. It is not understood why some people get gout and others do not. Surgery, joint injury, and eating too much of certain foods are some of the factors that can lead to gout attacks. SYMPTOMS   An attack of gout comes on quickly. It causes intense pain with redness, swelling, and warmth in a joint.  Fever can occur.  Often, only one joint is involved. Certain joints are more commonly  involved:  Base of the big toe.  Knee.  Ankle.  Wrist.  Finger. Without treatment, an attack usually goes away in a few days to weeks. Between attacks, you usually will not have symptoms, which is different from many other forms of arthritis. DIAGNOSIS  Your caregiver will suspect gout based on your symptoms and exam. In some cases, tests may be recommended. The tests may include:  Blood tests.  Urine tests.  X-rays.  Joint fluid exam. This exam requires a needle to remove fluid from the joint (arthrocentesis). Using a microscope, gout is confirmed when uric acid crystals are seen in the joint fluid. TREATMENT  There are two phases to gout treatment: treating the sudden onset (acute) attack and preventing attacks (prophylaxis).  Treatment of an Acute Attack.  Medicines are used. These include anti-inflammatory medicines or steroid medicines.  An injection of steroid medicine into the affected joint is sometimes necessary.  The painful joint is rested. Movement can worsen the arthritis.  You may use warm or cold treatments on painful joints, depending which works best for you.  Treatment to Prevent Attacks.  If you suffer from frequent gout attacks, your caregiver may advise preventive medicine. These medicines are started after the acute attack subsides. These medicines either help your kidneys eliminate uric acid from your body or decrease your uric acid production. You may need to stay on these medicines for a very long time.  The early phase of treatment with preventive medicine can be associated with an increase in acute gout attacks. For this reason, during the first few months of treatment,  your caregiver may also advise you to take medicines usually used for acute gout treatment. Be sure you understand your caregiver's directions. Your caregiver may make several adjustments to your medicine dose before these medicines are effective.  Discuss dietary treatment with your  caregiver or dietitian. Alcohol and drinks high in sugar and fructose and foods such as meat, poultry, and seafood can increase uric acid levels. Your caregiver or dietitian can advise you on drinks and foods that should be limited. HOME CARE INSTRUCTIONS   Do not take aspirin to relieve pain. This raises uric acid levels.  Only take over-the-counter or prescription medicines for pain, discomfort, or fever as directed by your caregiver.  Rest the joint as much as possible. When in bed, keep sheets and blankets off painful areas.  Keep the affected joint raised (elevated).  Apply warm or cold treatments to painful joints. Use of warm or cold treatments depends on which works best for you.  Use crutches if the painful joint is in your leg.  Drink enough fluids to keep your urine clear or pale yellow. This helps your body get rid of uric acid. Limit alcohol, sugary drinks, and fructose drinks.  Follow your dietary instructions. Pay careful attention to the amount of protein you eat. Your daily diet should emphasize fruits, vegetables, whole grains, and fat-free or low-fat milk products. Discuss the use of coffee, vitamin C, and cherries with your caregiver or dietitian. These may be helpful in lowering uric acid levels.  Maintain a healthy body weight. SEEK MEDICAL CARE IF:   You develop diarrhea, vomiting, or any side effects from medicines.  You do not feel better in 24 hours, or you are getting worse. SEEK IMMEDIATE MEDICAL CARE IF:   Your joint becomes suddenly more tender, and you have chills or a fever. MAKE SURE YOU:   Understand these instructions.  Will watch your condition.  Will get help right away if you are not doing well or get worse.   This information is not intended to replace advice given to you by your health care provider. Make sure you discuss any questions you have with your health care provider.   Document Released: 12/08/2000 Document Revised: 01/01/2015  Document Reviewed: 07/24/2012 Elsevier Interactive Patient Education [employer].

## 2016-01-19 DIAGNOSIS — M25641 Stiffness of right hand, not elsewhere classified: Secondary | ICD-10-CM | POA: Diagnosis not present

## 2016-01-19 DIAGNOSIS — S62642D Nondisplaced fracture of proximal phalanx of right middle finger, subsequent encounter for fracture with routine healing: Secondary | ICD-10-CM | POA: Diagnosis not present

## 2016-01-19 DIAGNOSIS — M79641 Pain in right hand: Secondary | ICD-10-CM | POA: Diagnosis not present

## 2016-01-19 DIAGNOSIS — S62644D Nondisplaced fracture of proximal phalanx of right ring finger, subsequent encounter for fracture with routine healing: Secondary | ICD-10-CM | POA: Diagnosis not present

## 2016-01-20 DIAGNOSIS — M79641 Pain in right hand: Secondary | ICD-10-CM | POA: Diagnosis not present

## 2016-01-20 DIAGNOSIS — M25641 Stiffness of right hand, not elsewhere classified: Secondary | ICD-10-CM | POA: Diagnosis not present

## 2016-01-20 DIAGNOSIS — S62642D Nondisplaced fracture of proximal phalanx of right middle finger, subsequent encounter for fracture with routine healing: Secondary | ICD-10-CM | POA: Diagnosis not present

## 2016-01-20 DIAGNOSIS — S62644D Nondisplaced fracture of proximal phalanx of right ring finger, subsequent encounter for fracture with routine healing: Secondary | ICD-10-CM | POA: Diagnosis not present

## 2016-01-24 DIAGNOSIS — M25641 Stiffness of right hand, not elsewhere classified: Secondary | ICD-10-CM | POA: Diagnosis not present

## 2016-01-24 DIAGNOSIS — M79641 Pain in right hand: Secondary | ICD-10-CM | POA: Diagnosis not present

## 2016-01-24 DIAGNOSIS — S62642D Nondisplaced fracture of proximal phalanx of right middle finger, subsequent encounter for fracture with routine healing: Secondary | ICD-10-CM | POA: Diagnosis not present

## 2016-01-24 DIAGNOSIS — S62644D Nondisplaced fracture of proximal phalanx of right ring finger, subsequent encounter for fracture with routine healing: Secondary | ICD-10-CM | POA: Diagnosis not present

## 2016-01-25 DIAGNOSIS — M25641 Stiffness of right hand, not elsewhere classified: Secondary | ICD-10-CM | POA: Diagnosis not present

## 2016-01-25 DIAGNOSIS — M79641 Pain in right hand: Secondary | ICD-10-CM | POA: Diagnosis not present

## 2016-01-25 DIAGNOSIS — S62642D Nondisplaced fracture of proximal phalanx of right middle finger, subsequent encounter for fracture with routine healing: Secondary | ICD-10-CM | POA: Diagnosis not present

## 2016-01-25 DIAGNOSIS — S62644D Nondisplaced fracture of proximal phalanx of right ring finger, subsequent encounter for fracture with routine healing: Secondary | ICD-10-CM | POA: Diagnosis not present

## 2016-02-01 DIAGNOSIS — S62644D Nondisplaced fracture of proximal phalanx of right ring finger, subsequent encounter for fracture with routine healing: Secondary | ICD-10-CM | POA: Diagnosis not present

## 2016-02-01 DIAGNOSIS — M25641 Stiffness of right hand, not elsewhere classified: Secondary | ICD-10-CM | POA: Diagnosis not present

## 2016-02-01 DIAGNOSIS — S62642D Nondisplaced fracture of proximal phalanx of right middle finger, subsequent encounter for fracture with routine healing: Secondary | ICD-10-CM | POA: Diagnosis not present

## 2016-02-01 DIAGNOSIS — M79641 Pain in right hand: Secondary | ICD-10-CM | POA: Diagnosis not present

## 2016-02-02 DIAGNOSIS — S62642D Nondisplaced fracture of proximal phalanx of right middle finger, subsequent encounter for fracture with routine healing: Secondary | ICD-10-CM | POA: Diagnosis not present

## 2016-02-02 DIAGNOSIS — S62644D Nondisplaced fracture of proximal phalanx of right ring finger, subsequent encounter for fracture with routine healing: Secondary | ICD-10-CM | POA: Diagnosis not present

## 2016-02-02 DIAGNOSIS — M79641 Pain in right hand: Secondary | ICD-10-CM | POA: Diagnosis not present

## 2016-02-02 DIAGNOSIS — M25641 Stiffness of right hand, not elsewhere classified: Secondary | ICD-10-CM | POA: Diagnosis not present

## 2016-02-03 DIAGNOSIS — M7989 Other specified soft tissue disorders: Secondary | ICD-10-CM | POA: Diagnosis not present

## 2016-02-03 DIAGNOSIS — S62642D Nondisplaced fracture of proximal phalanx of right middle finger, subsequent encounter for fracture with routine healing: Secondary | ICD-10-CM | POA: Diagnosis not present

## 2016-02-03 DIAGNOSIS — S62644D Nondisplaced fracture of proximal phalanx of right ring finger, subsequent encounter for fracture with routine healing: Secondary | ICD-10-CM | POA: Diagnosis not present

## 2016-02-03 DIAGNOSIS — M25641 Stiffness of right hand, not elsewhere classified: Secondary | ICD-10-CM | POA: Diagnosis not present

## 2016-02-07 DIAGNOSIS — M79641 Pain in right hand: Secondary | ICD-10-CM | POA: Diagnosis not present

## 2016-02-07 DIAGNOSIS — S62642D Nondisplaced fracture of proximal phalanx of right middle finger, subsequent encounter for fracture with routine healing: Secondary | ICD-10-CM | POA: Diagnosis not present

## 2016-02-07 DIAGNOSIS — S62644D Nondisplaced fracture of proximal phalanx of right ring finger, subsequent encounter for fracture with routine healing: Secondary | ICD-10-CM | POA: Diagnosis not present

## 2016-02-07 DIAGNOSIS — M25641 Stiffness of right hand, not elsewhere classified: Secondary | ICD-10-CM | POA: Diagnosis not present

## 2016-02-08 DIAGNOSIS — M25641 Stiffness of right hand, not elsewhere classified: Secondary | ICD-10-CM | POA: Diagnosis not present

## 2016-02-08 DIAGNOSIS — S62644D Nondisplaced fracture of proximal phalanx of right ring finger, subsequent encounter for fracture with routine healing: Secondary | ICD-10-CM | POA: Diagnosis not present

## 2016-02-08 DIAGNOSIS — S62642D Nondisplaced fracture of proximal phalanx of right middle finger, subsequent encounter for fracture with routine healing: Secondary | ICD-10-CM | POA: Diagnosis not present

## 2016-02-08 DIAGNOSIS — M79641 Pain in right hand: Secondary | ICD-10-CM | POA: Diagnosis not present

## 2016-02-14 DIAGNOSIS — M25641 Stiffness of right hand, not elsewhere classified: Secondary | ICD-10-CM | POA: Diagnosis not present

## 2016-02-14 DIAGNOSIS — S62642D Nondisplaced fracture of proximal phalanx of right middle finger, subsequent encounter for fracture with routine healing: Secondary | ICD-10-CM | POA: Diagnosis not present

## 2016-02-14 DIAGNOSIS — S62644D Nondisplaced fracture of proximal phalanx of right ring finger, subsequent encounter for fracture with routine healing: Secondary | ICD-10-CM | POA: Diagnosis not present

## 2016-02-14 DIAGNOSIS — M79641 Pain in right hand: Secondary | ICD-10-CM | POA: Diagnosis not present

## 2016-02-15 ENCOUNTER — Ambulatory Visit (INDEPENDENT_AMBULATORY_CARE_PROVIDER_SITE_OTHER): Payer: Medicare Other | Admitting: Family Medicine

## 2016-02-15 ENCOUNTER — Other Ambulatory Visit: Payer: Self-pay | Admitting: Family Medicine

## 2016-02-15 ENCOUNTER — Ambulatory Visit (INDEPENDENT_AMBULATORY_CARE_PROVIDER_SITE_OTHER): Payer: Medicare Other

## 2016-02-15 ENCOUNTER — Encounter: Payer: Self-pay | Admitting: Family Medicine

## 2016-02-15 VITALS — BP 146/77 | HR 77 | Temp 97.5°F | Ht 64.0 in | Wt 173.4 lb

## 2016-02-15 DIAGNOSIS — M25531 Pain in right wrist: Secondary | ICD-10-CM

## 2016-02-15 DIAGNOSIS — S62642D Nondisplaced fracture of proximal phalanx of right middle finger, subsequent encounter for fracture with routine healing: Secondary | ICD-10-CM | POA: Diagnosis not present

## 2016-02-15 DIAGNOSIS — S52121A Displaced fracture of head of right radius, initial encounter for closed fracture: Secondary | ICD-10-CM

## 2016-02-15 DIAGNOSIS — S62644D Nondisplaced fracture of proximal phalanx of right ring finger, subsequent encounter for fracture with routine healing: Secondary | ICD-10-CM | POA: Diagnosis not present

## 2016-02-15 DIAGNOSIS — S8001XA Contusion of right knee, initial encounter: Secondary | ICD-10-CM

## 2016-02-15 DIAGNOSIS — M25561 Pain in right knee: Secondary | ICD-10-CM | POA: Diagnosis not present

## 2016-02-15 DIAGNOSIS — M79641 Pain in right hand: Secondary | ICD-10-CM | POA: Diagnosis not present

## 2016-02-15 DIAGNOSIS — M25641 Stiffness of right hand, not elsewhere classified: Secondary | ICD-10-CM | POA: Diagnosis not present

## 2016-02-15 MED ORDER — OXYCODONE-ACETAMINOPHEN 5-325 MG PO TABS: 1.0000 | ORAL_TABLET | ORAL | Status: DC | PRN

## 2016-02-15 NOTE — Progress Notes (Signed)
Subjective:  Patient ID: Danielle Parrish, female    DOB: July 19, 1941  Age: 75 y.o. MRN: [id number]  CC: Knee Pain and Hand Pain   HPI Danielle Parrish presents for acute pain in the right hand and knee after falling on uneven pavement this morning. She landede on the knee and caught herself with the right hand outstretched. There is moderate level pain. Not relieved by hydrocodone. She has a history of osteoporosis    .  Danielle Parrish has a past medical history of Essential hypertension; Osteoporosis; Hyperlipidemia; Vitamin D deficiency; Anxiety; GERD (gastroesophageal reflux disease); Arthritis; Chronic pain of multiple joints; Sleep apnea; Hypothyroidism; and Depression.   She has past surgical history that includes Abdominal hernia repair; Abdominal hysterectomy (1977); and Knee arthroscopy (Left, 09/30/2014).   Her family history includes Cancer in her father; Hypertension in her mother; Stroke in her mother.She reports that she has never smoked. She has never used smokeless tobacco. She reports that she does not drink alcohol or use illicit drugs.    ROS Review of Systems  Constitutional: Negative for fever, activity change and appetite change.  HENT: Negative for congestion, rhinorrhea and sore throat.   Eyes: Negative for visual disturbance.  Respiratory: Negative for cough and shortness of breath.   Cardiovascular: Negative for chest pain and palpitations.  Gastrointestinal: Negative for nausea, abdominal pain and diarrhea.  Genitourinary: Negative for dysuria.  Musculoskeletal: Positive for myalgias and arthralgias.    Objective:  BP 146/77 mmHg  Pulse 77  Temp(Src) 97.5 F (36.4 C) (Oral)  Ht 5\' 4"  (1.626 m)  Wt 173 lb 6.4 oz (78.654 kg)  BMI 29.75 kg/m2  SpO2 94%  BP Readings from Last 3 Encounters:  02/15/16 146/77  01/11/16 128/70  12/02/15 126/73    Wt Readings from Last 3 Encounters:  02/15/16 173 lb 6.4 oz (78.654 kg)  01/11/16 163 lb 12.8 oz (74.299 kg)    12/02/15 160 lb 3.2 oz (72.666 kg)     Physical Exam  Constitutional: She appears well-developed and well-nourished.  HENT:  Head: Normocephalic.  Cardiovascular: Normal rate and regular rhythm.   No murmur heard. Pulmonary/Chest: Effort normal and breath sounds normal.  Musculoskeletal: Normal range of motion. She exhibits edema and tenderness.  The right  knee has full range of motion without discomfort  passively. Gait is normal without a limp. The joint lines are mildly tender. The patella is palpable with mild tenderness. Moderate edema. Not ballotable  Lachman / anterior drawer signs are negative for signs of instability and pain free. McMurray testing reveals no pop or excessive discomfort. Varus and valgus stree maneuvers do not cause ligamentous stretch or instability.   Right wrist has FROM mild tenderness. No edema. NV intact RUE & RLE       Lab Results  Component Value Date   WBC 8.8 11/06/2015   HGB 13.6 11/06/2015   HCT 40.0 11/06/2015   PLT 317 11/06/2015   GLUCOSE 92 11/06/2015   CHOL 149 04/08/2015   TRIG 138 04/08/2015   HDL 49 04/08/2015   LDLCALC 72 04/08/2015   ALT 13* 11/06/2015   AST 14* 11/06/2015   NA 137 11/06/2015   K 3.9 11/06/2015   CL 97* 11/06/2015   CREATININE 0.70 11/06/2015   BUN 10 11/06/2015   CO2 32 11/06/2015   TSH 1.510 10/12/2015   INR 1.03 11/06/2015   HGBA1C 5.8% 01/15/2014     Assessment & Plan:   Danielle Parrish was seen today for knee pain  and hand pain.  Diagnoses and all orders for this visit:  Right wrist pain -     DG Wrist Complete Right -     DME Other see comment  Right knee pain -     DG Knee 1-2 Views Right  Knee contusion, right, initial encounter  Right radial head fracture, closed, initial encounter -     Ambulatory referral to Orthopedics  Other orders -     oxyCODONE-acetaminophen (PERCOCET/ROXICET) 5-325 MG tablet; Take 1 tablet by mouth every 4 (four) hours as needed for severe pain.   Walk only  for essential activities such as toileting and eating. Stay off the right knee is much as possible. Apply ice packs frequently to the right knee and the right wrist. Wear wrist splint until seen by ortho.   The XR Reveals right radial head fracture - nondisplaced. No colles component appreciated  I have discontinued Danielle Parrish's predniSONE. I am also having her start on oxyCODONE-acetaminophen. Additionally, I am having her maintain her multivitamin, sodium chloride, Ferrous Sulfate, omeprazole, levothyroxine, sertraline, amLODipine, vitamin E, niacin, diphenhydrAMINE, HYDROcodone-acetaminophen, gabapentin, and furosemide.  Meds ordered this encounter  Medications  . oxyCODONE-acetaminophen (PERCOCET/ROXICET) 5-325 MG tablet    Sig: Take 1 tablet by mouth every 4 (four) hours as needed for severe pain.    Dispense:  30 tablet    Refill:  0   Walk only for essential activities such as toileting and eating. Stay off the right knee is much as possible. Apply ice packs frequently to the right knee and the right wrist.  Follow-up: Return in about 6 days (around 02/21/2016), or knee and wirst pain.  Claretta Fraise, M.D.

## 2016-02-15 NOTE — Patient Instructions (Signed)
Walk only for essential activities such as toileting and eating. Stay off the right knee is much as possible. Apply ice packs frequently to the right knee and the right wrist.

## 2016-02-15 NOTE — Progress Notes (Signed)
Quick Note:  Please contact the patient regarding:the xray confirmed that the area of concern in your wrist is a fracture. It appears minor, but you need to wear the brace and we will set up ortho referral. ______

## 2016-02-16 ENCOUNTER — Telehealth: Payer: Self-pay | Admitting: Family Medicine

## 2016-02-16 ENCOUNTER — Telehealth: Payer: Self-pay | Admitting: *Deleted

## 2016-02-16 NOTE — Progress Notes (Signed)
Pt notified appt scheduled with ortho

## 2016-02-16 NOTE — Telephone Encounter (Signed)
Pt wants to see Dr Drema Dallas (Ortho) instead of Dr Amedeo Plenty Please schedule

## 2016-02-16 NOTE — Telephone Encounter (Signed)
Called patient and she is OK with Dr Amedeo Plenty

## 2016-02-17 DIAGNOSIS — S63501A Unspecified sprain of right wrist, initial encounter: Secondary | ICD-10-CM | POA: Diagnosis not present

## 2016-02-17 MED ORDER — FLUCONAZOLE 150 MG PO TABS: ORAL_TABLET | ORAL | Status: DC

## 2016-02-17 NOTE — Telephone Encounter (Signed)
Stp and she gets yeast infections frequently, pt c/o vaginal itching thick white discharge, no foul smell or lesions noted. rx for Diflucan sent to pharmacy per Dr.Bradshaw and pt is aware. Advised pt if symptoms persist or worsen will ntbs for evaluation. Pt voiced understanding.

## 2016-02-18 ENCOUNTER — Ambulatory Visit (INDEPENDENT_AMBULATORY_CARE_PROVIDER_SITE_OTHER): Payer: Medicare Other

## 2016-02-18 ENCOUNTER — Encounter: Payer: Self-pay | Admitting: Family Medicine

## 2016-02-18 ENCOUNTER — Ambulatory Visit (INDEPENDENT_AMBULATORY_CARE_PROVIDER_SITE_OTHER): Payer: Medicare Other | Admitting: Family Medicine

## 2016-02-18 VITALS — BP 131/79 | HR 73 | Temp 98.3°F | Ht 64.0 in | Wt 174.8 lb

## 2016-02-18 DIAGNOSIS — I10 Essential (primary) hypertension: Secondary | ICD-10-CM

## 2016-02-18 DIAGNOSIS — G894 Chronic pain syndrome: Secondary | ICD-10-CM | POA: Diagnosis not present

## 2016-02-18 DIAGNOSIS — R0789 Other chest pain: Secondary | ICD-10-CM | POA: Diagnosis not present

## 2016-02-18 NOTE — Progress Notes (Signed)
   HPI  Patient presents today ere today with rib pain and needing a refill forchronic medications.  He explains that 3 days ago, the day that she was seen in this clinic 02/15/2016, [place]. She fell on her right side causing right knee painright away. After she left here she developed right-sided chest pain that concerned her. She has a sharp right-sided lower chest pain with deep inspiration, cough, or clearing her throat.  She has no signs ofillness including no fever, chills, sweats,increased cough, orshortness of breath.  She's been using oxycodone as prescribed 3 days ago which is giving her good pain relief.  He has been to orthopedic surgery since that visit andher right wrist which was previously broken2-3 months ago is okay.  PMH: Smoking status noted ROS: Per HPI  Objective: BP 131/79 mmHg  Pulse 73  Temp(Src) 98.3 F (36.8 C) (Oral)  Ht 5\' 4"  (1.626 m)  Wt 174 lb 12.8 oz (79.289 kg)  BMI 29.99 kg/m2 Gen: NAD, alert, cooperative with exam HEENT: NCAT, facial bruising CV: RRR, good S1/S2, no murmur Resp: CTABL, no wheezes, non-labored Abd: Soft, nontender, right lower ribs tenderness to palpation which reproduces pain approximately ribs7 through 9 no bruising apparent Ext: No edema, warm Neuro: Alert and oriented, No gross deficits  Plain film- ribs No acute findings   Assessment and plan:  # Sheila skeletal chest pain Continue pain medication as needed, ice Rib series clear Ice, pain meds RTC on monday as planned.     Orders Placed This Encounter  Procedures  . DG Ribs Unilateral W/Chest Right    Standing Status: Future     Number of Occurrences:      Standing Expiration Date: 04/19/2017    Order Specific Question:  Reason for Exam (SYMPTOM  OR DIAGNOSIS REQUIRED)    Answer:  fall with now rib pain    Order Specific Question:  Preferred imaging location?    Answer:  Internal    Meds ordered  this encounter  Medications  . vitamin C (ASCORBIC ACID) 500 MG tablet    Sig: Take 500 mg by mouth daily.    Laroy Apple, MD [facility] 02/18/2016, 1:32 PM

## 2016-02-22 ENCOUNTER — Encounter: Payer: Self-pay | Admitting: Family Medicine

## 2016-02-22 ENCOUNTER — Ambulatory Visit (INDEPENDENT_AMBULATORY_CARE_PROVIDER_SITE_OTHER): Payer: Medicare Other | Admitting: Family Medicine

## 2016-02-22 VITALS — BP 141/76 | HR 86 | Temp 98.2°F | Ht 64.0 in | Wt 175.6 lb

## 2016-02-22 DIAGNOSIS — S62644D Nondisplaced fracture of proximal phalanx of right ring finger, subsequent encounter for fracture with routine healing: Secondary | ICD-10-CM | POA: Diagnosis not present

## 2016-02-22 DIAGNOSIS — M1990 Unspecified osteoarthritis, unspecified site: Secondary | ICD-10-CM

## 2016-02-22 DIAGNOSIS — S62642D Nondisplaced fracture of proximal phalanx of right middle finger, subsequent encounter for fracture with routine healing: Secondary | ICD-10-CM | POA: Diagnosis not present

## 2016-02-22 DIAGNOSIS — M79641 Pain in right hand: Secondary | ICD-10-CM | POA: Diagnosis not present

## 2016-02-22 DIAGNOSIS — G894 Chronic pain syndrome: Secondary | ICD-10-CM | POA: Diagnosis not present

## 2016-02-22 DIAGNOSIS — M25641 Stiffness of right hand, not elsewhere classified: Secondary | ICD-10-CM | POA: Diagnosis not present

## 2016-02-22 MED ORDER — HYDROCODONE-ACETAMINOPHEN 7.5-325 MG PO TABS: 0.5000 | ORAL_TABLET | Freq: Four times a day (QID) | ORAL | Status: DC | PRN

## 2016-02-22 NOTE — Patient Instructions (Signed)
Great to see you!  Continue using the ice, compression of you rknee (an ace bandage) may also help  Try to begin cutting back on the oxycodone I have refilled your usual hydrocodone prescription

## 2016-02-22 NOTE — Progress Notes (Signed)
   HPI  Patient presents today follow-up after her fall last week.  Patient was seen last week after falling at the post office in the [facility].   She states she is till having R knee pain, R wrist pain, and R breast pain where she has diffuse bruising. Ice is helping.   She has been taking only oxycodone for pain, approx 3-4 pills daily. She has not been taking her usual hydrocodone.   She denies any fevers, dyspnea, or difficult eating or drinking  PMH: Smoking status noted ROS: Per HPI  Objective: BP 141/76 mmHg  Pulse 86  Temp(Src) 98.2 F (36.8 C) (Oral)  Ht 5\' 4"  (1.626 m)  Wt 175 lb 9.6 oz (79.652 kg)  BMI 30.13 kg/m2 Gen: NAD, alert, cooperative with exam HEENT: NCAT CV: RRR, good S1/S2, no murmur Resp: CTABL, no wheezes, non-labored Ext: No edema, warm Neuro: Alert and oriented, No gross deficits  MSK: R knee with mild effusion, no erythema, bruising, or gross deformity No pain with full flexion   Assessment and plan:  # chronic pain syndrome, pain after a fall Progressing slowly. Bruising resolving Refilled usual does of hydrocodone. Discussed extreme caution with narcotics and falls.  She will begins titrating back to her usual dose.  She describes bruising of the breast, declines exam RTC with any concerns or worsening symptoms.     Meds ordered this encounter  Medications  . HYDROcodone-acetaminophen (NORCO) 7.5-325 MG tablet    Sig: Take 0.5-1 tablets by mouth every 6 (six) hours as needed for moderate pain.    Dispense:  45 tablet    Refill:  0    Please do not fill until 30 days after the previous fill.    Laroy Apple, MD [facility] 02/22/2016, 3:25 PM

## 2016-02-23 DIAGNOSIS — M25641 Stiffness of right hand, not elsewhere classified: Secondary | ICD-10-CM | POA: Diagnosis not present

## 2016-02-23 DIAGNOSIS — M79641 Pain in right hand: Secondary | ICD-10-CM | POA: Diagnosis not present

## 2016-02-23 DIAGNOSIS — S62644D Nondisplaced fracture of proximal phalanx of right ring finger, subsequent encounter for fracture with routine healing: Secondary | ICD-10-CM | POA: Diagnosis not present

## 2016-02-23 DIAGNOSIS — S62642D Nondisplaced fracture of proximal phalanx of right middle finger, subsequent encounter for fracture with routine healing: Secondary | ICD-10-CM | POA: Diagnosis not present

## 2016-02-28 DIAGNOSIS — M79641 Pain in right hand: Secondary | ICD-10-CM | POA: Diagnosis not present

## 2016-02-28 DIAGNOSIS — S62642D Nondisplaced fracture of proximal phalanx of right middle finger, subsequent encounter for fracture with routine healing: Secondary | ICD-10-CM | POA: Diagnosis not present

## 2016-02-28 DIAGNOSIS — S62644D Nondisplaced fracture of proximal phalanx of right ring finger, subsequent encounter for fracture with routine healing: Secondary | ICD-10-CM | POA: Diagnosis not present

## 2016-02-28 DIAGNOSIS — M25641 Stiffness of right hand, not elsewhere classified: Secondary | ICD-10-CM | POA: Diagnosis not present

## 2016-02-29 DIAGNOSIS — M25641 Stiffness of right hand, not elsewhere classified: Secondary | ICD-10-CM | POA: Diagnosis not present

## 2016-02-29 DIAGNOSIS — S62644D Nondisplaced fracture of proximal phalanx of right ring finger, subsequent encounter for fracture with routine healing: Secondary | ICD-10-CM | POA: Diagnosis not present

## 2016-02-29 DIAGNOSIS — S62642D Nondisplaced fracture of proximal phalanx of right middle finger, subsequent encounter for fracture with routine healing: Secondary | ICD-10-CM | POA: Diagnosis not present

## 2016-02-29 DIAGNOSIS — M79641 Pain in right hand: Secondary | ICD-10-CM | POA: Diagnosis not present

## 2016-03-02 ENCOUNTER — Encounter: Payer: Self-pay | Admitting: Family

## 2016-03-02 ENCOUNTER — Ambulatory Visit (INDEPENDENT_AMBULATORY_CARE_PROVIDER_SITE_OTHER): Payer: Medicare Other | Admitting: Family

## 2016-03-02 VITALS — BP 126/74 | HR 92 | Temp 98.2°F | Ht 64.0 in | Wt 174.2 lb

## 2016-03-02 DIAGNOSIS — N390 Urinary tract infection, site not specified: Secondary | ICD-10-CM

## 2016-03-02 DIAGNOSIS — S62642D Nondisplaced fracture of proximal phalanx of right middle finger, subsequent encounter for fracture with routine healing: Secondary | ICD-10-CM | POA: Diagnosis not present

## 2016-03-02 DIAGNOSIS — R309 Painful micturition, unspecified: Secondary | ICD-10-CM | POA: Diagnosis not present

## 2016-03-02 DIAGNOSIS — M79644 Pain in right finger(s): Secondary | ICD-10-CM | POA: Diagnosis not present

## 2016-03-02 DIAGNOSIS — M1811 Unilateral primary osteoarthritis of first carpometacarpal joint, right hand: Secondary | ICD-10-CM | POA: Diagnosis not present

## 2016-03-02 DIAGNOSIS — M25641 Stiffness of right hand, not elsewhere classified: Secondary | ICD-10-CM | POA: Diagnosis not present

## 2016-03-02 LAB — URINALYSIS, COMPLETE
Bilirubin, UA: NEGATIVE
Glucose, UA: NEGATIVE
Ketones, UA: NEGATIVE
Nitrite, UA: NEGATIVE
Protein, UA: NEGATIVE
RBC, UA: NEGATIVE
Specific Gravity, UA: 1.015 (ref 1.005–1.030)
Urobilinogen, Ur: 0.2 mg/dL (ref 0.2–1.0)
pH, UA: 6 (ref 5.0–7.5)

## 2016-03-02 LAB — MICROSCOPIC EXAMINATION
Bacteria, UA: NONE SEEN
Epithelial Cells (non renal): NONE SEEN /hpf (ref 0–10)
RBC, UA: NONE SEEN /hpf (ref 0–?)

## 2016-03-02 MED ORDER — SULFAMETHOXAZOLE-TRIMETHOPRIM 800-160 MG PO TABS: 1.0000 | ORAL_TABLET | Freq: Two times a day (BID) | ORAL | Status: DC

## 2016-03-02 NOTE — Patient Instructions (Signed)

## 2016-03-02 NOTE — Progress Notes (Signed)
   Subjective:    Patient ID: Danielle Parrish, female    DOB: July 03, 1941, 75 y.o.   MRN: [id number]  Dysuria  This is a new problem. The current episode started in the past 7 days. The problem occurs intermittently. The problem has been unchanged. The quality of the pain is described as burning. The pain is at a severity of 4/10. The pain is mild. Associated symptoms include chills, a discharge, frequency and urgency. Pertinent negatives include no flank pain, hematuria, hesitancy or vomiting. She has tried increased fluids for the symptoms. The treatment provided mild relief.      Review of Systems  Constitutional: Positive for chills.  HENT: Negative.   Eyes: Negative.   Respiratory: Negative.  Negative for shortness of breath.   Cardiovascular: Negative.  Negative for palpitations.  Gastrointestinal: Negative.  Negative for vomiting.  Endocrine: Negative.   Genitourinary: Positive for dysuria, urgency and frequency. Negative for hesitancy, hematuria and flank pain.  Musculoskeletal: Negative.   Neurological: Negative.  Negative for headaches.  Hematological: Negative.   Psychiatric/Behavioral: Negative.   All other systems reviewed and are negative.      Objective:   Physical Exam  Constitutional: She is oriented to person, place, and time. She appears well-developed and well-nourished. No distress.  HENT:  Head: Normocephalic and atraumatic.  Eyes: Pupils are equal, round, and reactive to light.  Neck: Normal range of motion. Neck supple. No thyromegaly present.  Cardiovascular: Normal rate, regular rhythm, normal heart sounds and intact distal pulses.   No murmur heard. Pulmonary/Chest: Effort normal and breath sounds normal. No respiratory distress. She has no wheezes.  Abdominal: Soft. Bowel sounds are normal. She exhibits no distension. There is no tenderness.  Musculoskeletal: Normal range of motion. She exhibits no edema or tenderness.  Negative for CVA tenderness     Neurological: She is alert and oriented to person, place, and time. She has normal reflexes. No cranial nerve deficit.  Skin: Skin is warm and dry.  Psychiatric: She has a normal mood and affect. Her behavior is normal. Judgment and thought content normal.  Vitals reviewed.     BP 126/74 mmHg  Pulse 92  Temp(Src) 98.2 F (36.8 C) (Oral)  Ht 5\' 4"  (1.626 m)  Wt 174 lb 3.2 oz (79.017 kg)  BMI 29.89 kg/m2     Assessment & Plan:  1. Pain on voiding - Urinalysis, Complete  2. Urinary tract infection without hematuria, site unspecified -Force fluids AZO over the counter X2 days RTO prn - sulfamethoxazole-trimethoprim (BACTRIM DS) 800-160 MG tablet; Take 1 tablet by mouth 2 (two) times daily.  Dispense: 14 tablet; Refill: 0  Evelina Dun, FNP

## 2016-03-09 DIAGNOSIS — S6991XD Unspecified injury of right wrist, hand and finger(s), subsequent encounter: Secondary | ICD-10-CM | POA: Diagnosis not present

## 2016-03-17 ENCOUNTER — Telehealth: Payer: Self-pay | Admitting: Family Medicine

## 2016-03-21 ENCOUNTER — Other Ambulatory Visit: Payer: Self-pay | Admitting: Gastroenterology

## 2016-03-21 DIAGNOSIS — D126 Benign neoplasm of colon, unspecified: Secondary | ICD-10-CM | POA: Diagnosis not present

## 2016-03-21 DIAGNOSIS — D125 Benign neoplasm of sigmoid colon: Secondary | ICD-10-CM | POA: Diagnosis not present

## 2016-03-21 DIAGNOSIS — K5730 Diverticulosis of large intestine without perforation or abscess without bleeding: Secondary | ICD-10-CM | POA: Diagnosis not present

## 2016-03-21 DIAGNOSIS — Z86010 Personal history of colonic polyps: Secondary | ICD-10-CM | POA: Diagnosis not present

## 2016-03-23 ENCOUNTER — Ambulatory Visit: Payer: Medicare Other | Admitting: Neurology

## 2016-04-05 DIAGNOSIS — H538 Other visual disturbances: Secondary | ICD-10-CM | POA: Diagnosis not present

## 2016-04-05 DIAGNOSIS — H2513 Age-related nuclear cataract, bilateral: Secondary | ICD-10-CM | POA: Diagnosis not present

## 2016-04-10 ENCOUNTER — Other Ambulatory Visit: Payer: Self-pay | Admitting: Family Medicine

## 2016-04-11 ENCOUNTER — Other Ambulatory Visit: Payer: Self-pay | Admitting: Family Medicine

## 2016-04-11 DIAGNOSIS — F419 Anxiety disorder, unspecified: Secondary | ICD-10-CM

## 2016-04-11 DIAGNOSIS — I10 Essential (primary) hypertension: Secondary | ICD-10-CM

## 2016-04-11 MED ORDER — AMLODIPINE BESYLATE 10 MG PO TABS: 10.0000 mg | ORAL_TABLET | Freq: Every day | ORAL | Status: DC

## 2016-04-11 MED ORDER — SERTRALINE HCL 100 MG PO TABS: 100.0000 mg | ORAL_TABLET | ORAL | Status: DC

## 2016-04-11 NOTE — Telephone Encounter (Signed)
done

## 2016-04-13 ENCOUNTER — Ambulatory Visit (INDEPENDENT_AMBULATORY_CARE_PROVIDER_SITE_OTHER): Payer: Medicare Other | Admitting: Family Medicine

## 2016-04-13 ENCOUNTER — Encounter: Payer: Self-pay | Admitting: Family Medicine

## 2016-04-13 VITALS — BP 149/80 | HR 75 | Temp 98.0°F | Ht 64.0 in | Wt 177.8 lb

## 2016-04-13 DIAGNOSIS — F39 Unspecified mood [affective] disorder: Secondary | ICD-10-CM

## 2016-04-13 DIAGNOSIS — G894 Chronic pain syndrome: Secondary | ICD-10-CM | POA: Diagnosis not present

## 2016-04-13 DIAGNOSIS — M1990 Unspecified osteoarthritis, unspecified site: Secondary | ICD-10-CM

## 2016-04-13 MED ORDER — SERTRALINE HCL 100 MG PO TABS: 100.0000 mg | ORAL_TABLET | ORAL | Status: DC

## 2016-04-13 MED ORDER — HYDROCODONE-ACETAMINOPHEN 7.5-325 MG PO TABS: 0.5000 | ORAL_TABLET | Freq: Four times a day (QID) | ORAL | Status: DC | PRN

## 2016-04-13 NOTE — Patient Instructions (Addendum)
Great to see you!  Lets see you back in 2 months unless you need [place] sooner.

## 2016-04-13 NOTE — Progress Notes (Signed)
   HPI  Patient presents today here with ear wax impaction, pain, and depression.  Ears Patient explains she was being seen by audiology for hearing aid fitting and they told her that she had earwax impaction. They recommended seeing her doctor.  Pain Severe right knee pain, she has cut back significantly on her hydrocodone use. She states that she needs a refill for intermittent dosing.  Mood disorder, anxiety, depression States that she's been off of Zoloft for a while can tell a big difference, she is more emotional than usual, she has crying spells, she requests going back on it. Denies any suicidal thoughts  PMH: Smoking status noted ROS: Per HPI  Objective: BP 149/80 mmHg  Pulse 75  Temp(Src) 98 F (36.7 C) (Oral)  Ht 5\' 4"  (1.626 m)  Wt 177 lb 12.8 oz (80.65 kg)  BMI 30.50 kg/m2 Gen: NAD, alert, cooperative with exam HEENT: NCAT, TMs normal bilaterally, right TM was initially obscured by cerumen, however after washout it was easily identified CV: RRR, good S1/S2, no murmur Resp: CTABL, no wheezes, non-labored Ext: No edema, warm Neuro: Alert and oriented, No gross deficits  Assessment and plan:  # Earwax impaction Cleaned out today Recommend follow-up with audiology as needed  # Chronic knee pain, chronic pain syndrome Refilled hydrocodone I'm happy that she's reduced her dose  # Mood disorder, anxiety, depression Restart Zoloft Follow-up 2 months    Meds ordered this encounter  Medications  . HYDROcodone-acetaminophen (NORCO) 7.5-325 MG tablet    Sig: Take 0.5-1 tablets by mouth every 6 (six) hours as needed for moderate pain.    Dispense:  45 tablet    Refill:  0    Please do not fill until 30 days after the previous fill.  . sertraline (ZOLOFT) 100 MG tablet    Sig: Take 1 tablet (100 mg total) by mouth every morning.    Dispense:  90 tablet    Refill:  [place], MD [facility] 04/13/2016, 12:21 PM

## 2016-04-14 ENCOUNTER — Encounter (INDEPENDENT_AMBULATORY_CARE_PROVIDER_SITE_OTHER): Payer: Self-pay

## 2016-04-14 ENCOUNTER — Other Ambulatory Visit: Payer: Self-pay | Admitting: *Deleted

## 2016-04-14 DIAGNOSIS — M1990 Unspecified osteoarthritis, unspecified site: Secondary | ICD-10-CM

## 2016-04-14 MED ORDER — HYDROCODONE-ACETAMINOPHEN 7.5-325 MG PO TABS: 0.5000 | ORAL_TABLET | Freq: Four times a day (QID) | ORAL | Status: DC | PRN

## 2016-04-16 IMAGING — CR DG KNEE 1-2V*R*
2 series · 2 of 2 positions shown · non-contrast
Comparison: No prior.

CLINICAL DATA: Fall.  Pain.  Initial evaluation .

EXAM:
RIGHT KNEE - 1-2 VIEW

[view not recorded (1 of 2)]
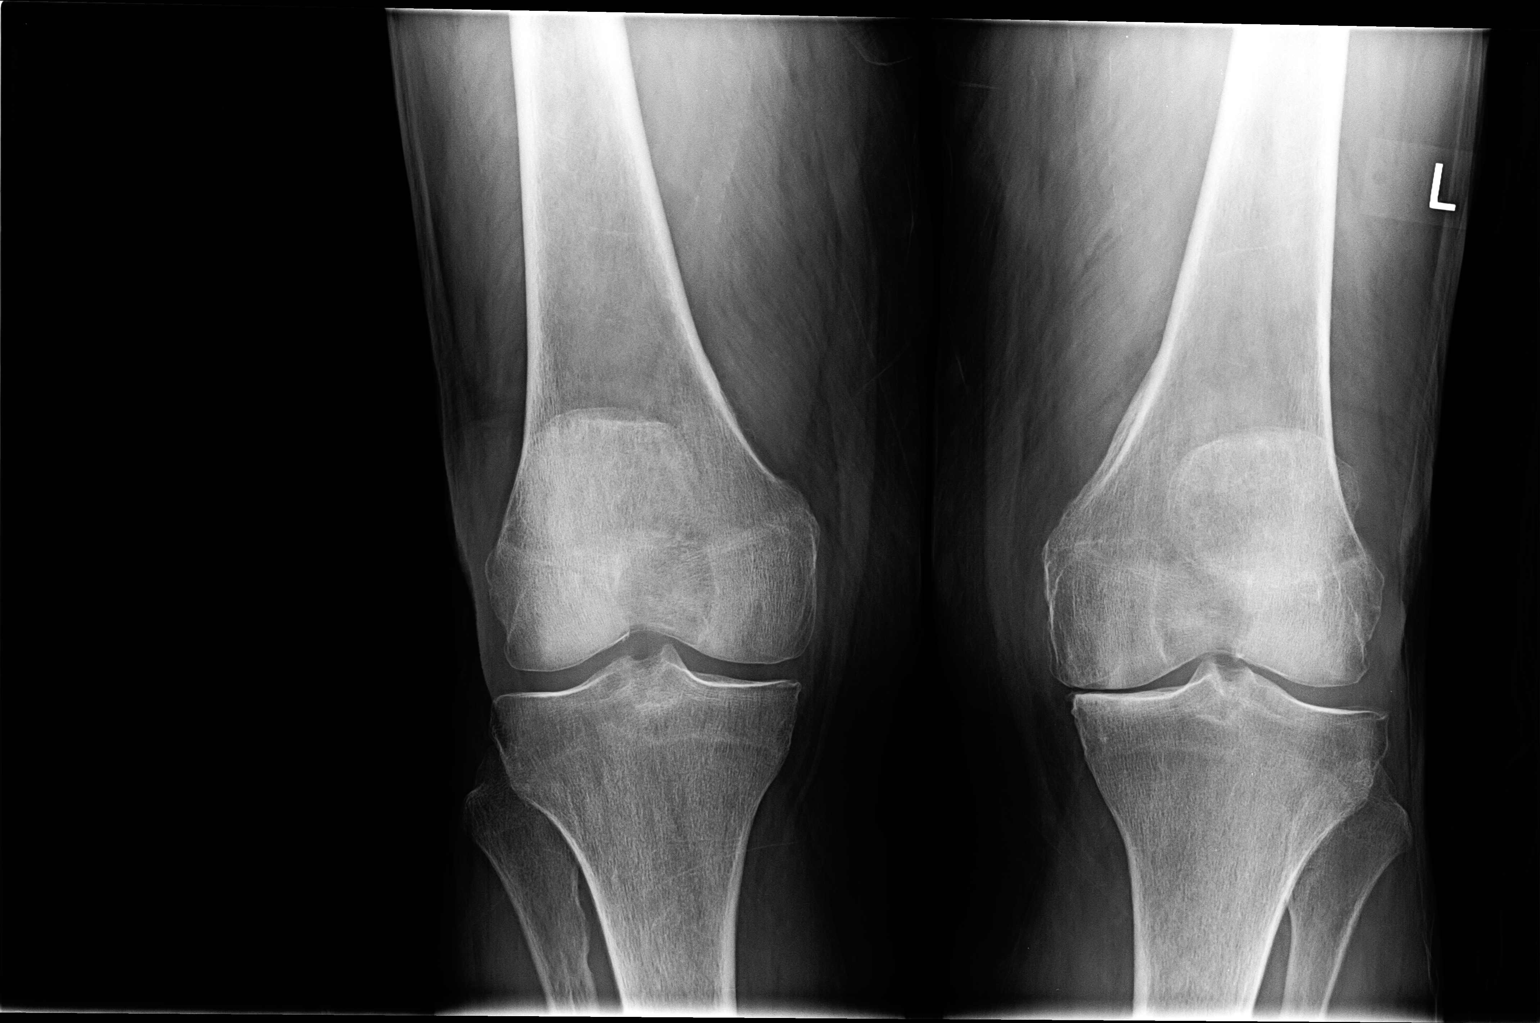

[view not recorded (2 of 2)]
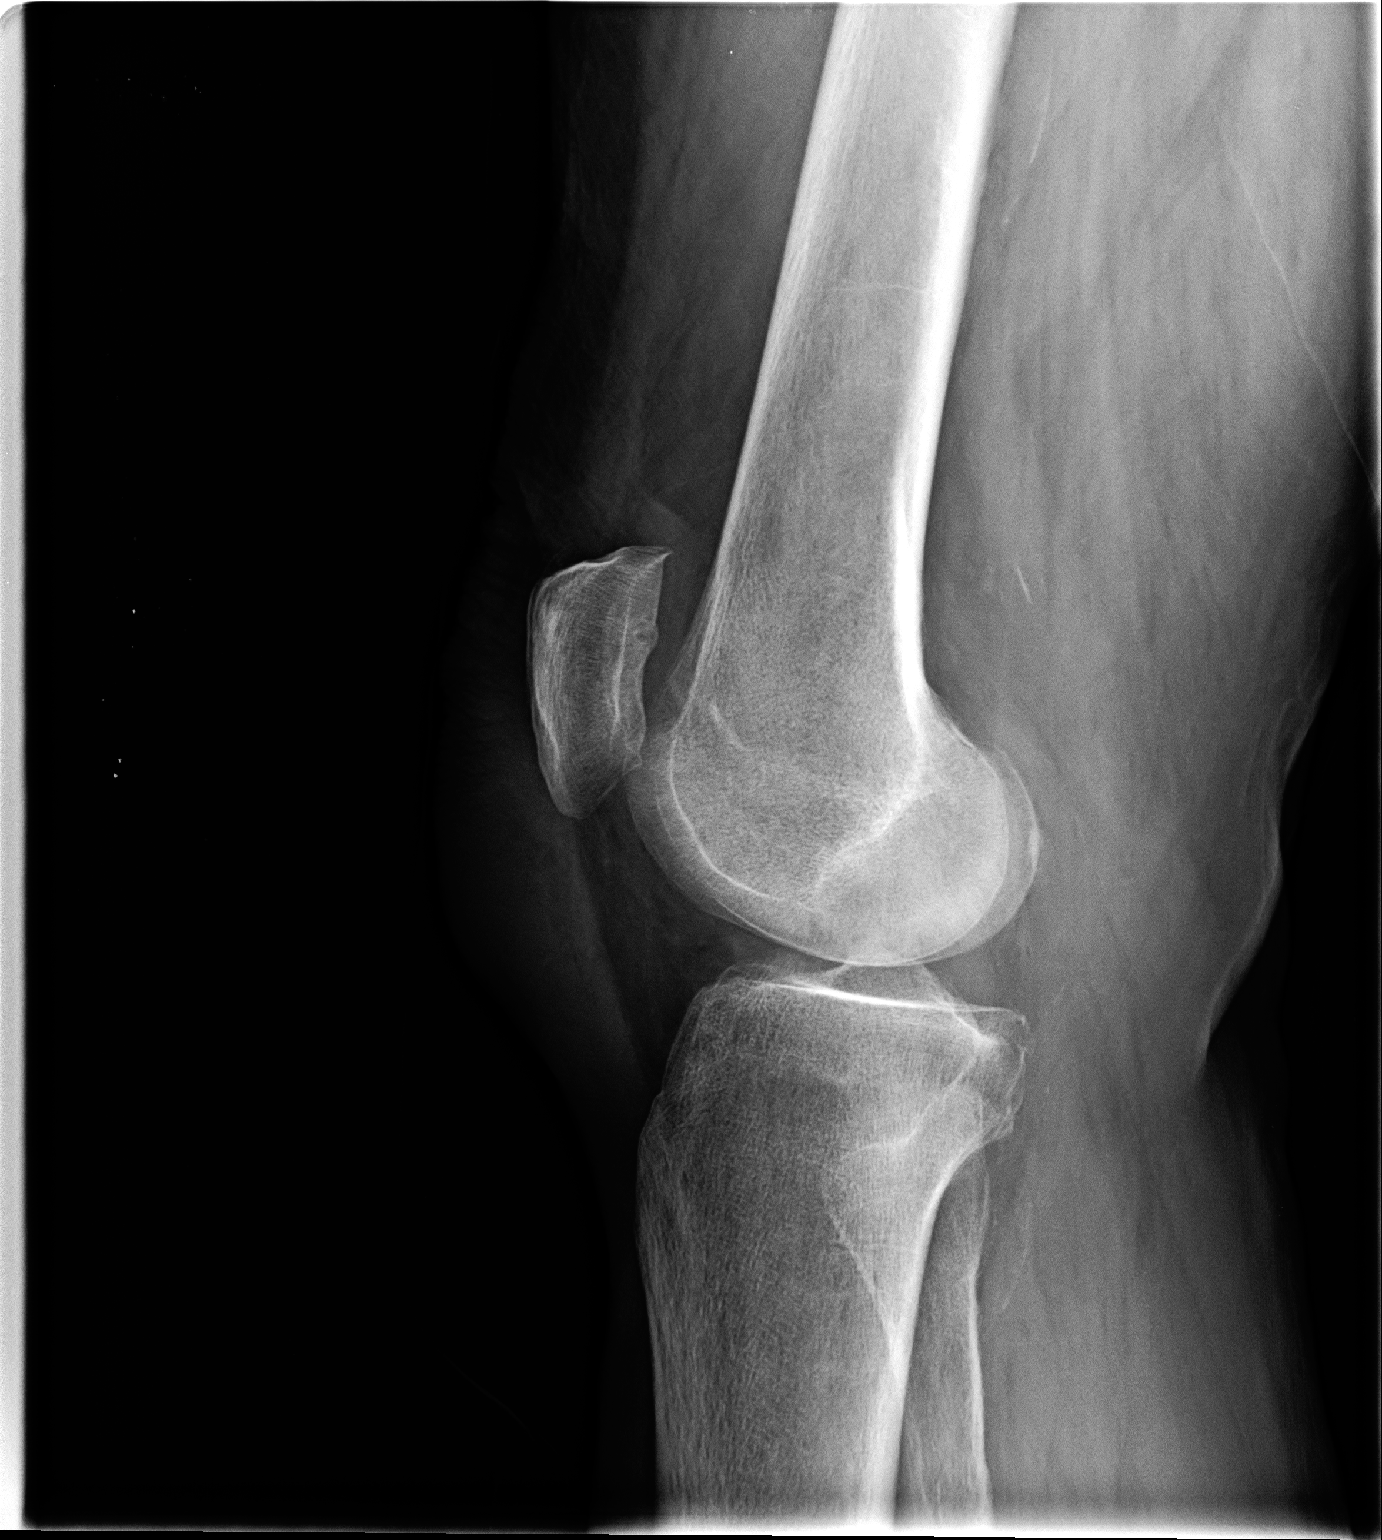

[2 of 2 positions shown; findings below may reference images not displayed]

FINDINGS: No acute bony or joint abnormality identified. Bilateral
degenerative change. No acute bony abnormality . Tiny right knee
joint effusion cannot be excluded . Peripheral vascular
calcification.
IMPRESSION: 1. Bilateral degenerative change. No acute bony abnormality. Tiny
right knee joint effusion cannot be excluded .

2. Peripheral vascular disease.

## 2016-05-29 ENCOUNTER — Ambulatory Visit: Payer: Medicare Other | Admitting: Family Medicine

## 2016-05-29 ENCOUNTER — Encounter: Payer: Self-pay | Admitting: Family Medicine

## 2016-05-29 ENCOUNTER — Telehealth: Payer: Self-pay | Admitting: *Deleted

## 2016-05-29 ENCOUNTER — Ambulatory Visit (INDEPENDENT_AMBULATORY_CARE_PROVIDER_SITE_OTHER): Payer: Medicare Other | Admitting: Family Medicine

## 2016-05-29 VITALS — BP 130/72 | HR 84 | Temp 100.4°F | Ht 64.0 in | Wt 180.4 lb

## 2016-05-29 DIAGNOSIS — L03116 Cellulitis of left lower limb: Secondary | ICD-10-CM | POA: Diagnosis not present

## 2016-05-29 MED ORDER — DOXYCYCLINE HYCLATE 100 MG PO TABS: 100.0000 mg | ORAL_TABLET | Freq: Two times a day (BID) | ORAL | Status: DC

## 2016-05-29 NOTE — Telephone Encounter (Signed)
Needs an OV for refill of pain meds. She discussed with dr. Warrick Parisian who reminded her that she will need to be seen for refill.   Laroy Apple, MD [facility] 05/29/2016, 4:58 PM

## 2016-05-29 NOTE — Telephone Encounter (Signed)
Danielle Parrish would like a refill on pain medication.  She was seen by another provider today but did not get that medication. (hydrocodone)

## 2016-05-29 NOTE — Progress Notes (Signed)
BP 130/72 mmHg  Pulse 84  Temp(Src) 100.4 F (38 C) (Oral)  Ht 5\' 4"  (1.626 m)  Wt 180 lb 6.4 oz (81.829 kg)  BMI 30.95 kg/m2   Subjective:    Patient ID: Danielle Parrish, female    DOB: [birth year], 75 y.o.   MRN: [id number]  HPI: Danielle Parrish is a 75 y.o. female presenting on 05/29/2016 for Left foot pain   HPI Left foot pain and swelling Patient has been having left foot pain and swelling on the medial aspect of her left foot that has been going on for the past 4 days. She denies knowing of any fevers or chills but does have a fever of 100.4 today here in the office. Now that she thinks about it she says she has been a little bit more warm throughout today. She denies any chills or shortness of breath or wheezing or pain going anywhere else besides that foot. She has had a history of gout but she does not remember what joint it was in. The pain is worse made medial foot and the swelling sense throughout.  Relevant past medical, surgical, family and social history reviewed and updated as indicated. Interim medical history since our last visit reviewed. Allergies and medications reviewed and updated.  Review of Systems  Constitutional: Negative for fever and chills.  HENT: Negative for congestion, ear discharge and ear pain.   Eyes: Negative for redness and visual disturbance.  Respiratory: Negative for chest tightness and shortness of breath.   Cardiovascular: Negative for chest pain and leg swelling.  Genitourinary: Negative for dysuria and difficulty urinating.  Musculoskeletal: Positive for joint swelling and arthralgias. Negative for back pain and gait problem.  Skin: Positive for color change. Negative for rash and wound.  Neurological: Negative for light-headedness and headaches.  Psychiatric/Behavioral: Negative for behavioral problems and agitation.  All other systems reviewed and are negative.   Per HPI unless specifically indicated above     Medication List      This list is accurate as of: 05/29/16  4:06 PM.  Always use your most recent med list.               amLODipine 10 MG tablet  Commonly known as:  NORVASC  Take 1 tablet (10 mg total) by mouth daily.     Biotin 10 MG Caps  Take by mouth.     diphenhydrAMINE 25 MG tablet  Commonly known as:  BENADRYL  Take 50 mg by mouth at bedtime.     doxycycline 100 MG tablet  Commonly known as:  VIBRA-TABS  Take 1 tablet (100 mg total) by mouth 2 (two) times daily. 1 po bid     Ferrous Sulfate 90 (18 Fe) MG Tabs  Take 1 tablet by mouth every morning.     furosemide 20 MG tablet  Commonly known as:  LASIX  Take 1 tablet (20 mg total) by mouth daily.     gabapentin 100 MG capsule  Commonly known as:  NEURONTIN  TAKE 1 CAPSULE AT BEDTIME FOR 7 DAYS, THEN 1 CAPSULE TWICE DAILY     HYDROcodone-acetaminophen 7.5-325 MG tablet  Commonly known as:  NORCO  Take 0.5-1 tablets by mouth every 6 (six) hours as needed for moderate pain.     levothyroxine 100 MCG tablet  Commonly known as:  SYNTHROID, LEVOTHROID  Take 1 tablet (100 mcg total) by mouth daily before breakfast.     multivitamin tablet  Take 1 tablet by  mouth every morning.     niacin 500 MG tablet  Take 500 mg by mouth at bedtime.     omeprazole 40 MG capsule  Commonly known as:  PRILOSEC  Take 1 capsule (40 mg total) by mouth every morning.     pyridOXINE 100 MG tablet  Commonly known as:  VITAMIN B-6  Take 100 mg by mouth daily.     sertraline 100 MG tablet  Commonly known as:  ZOLOFT  Take 1 tablet (100 mg total) by mouth every morning.     sodium chloride 0.65 % Soln nasal spray  Commonly known as:  OCEAN  Place 1 spray into both nostrils as needed for congestion.     vitamin B-12 100 MCG tablet  Commonly known as:  CYANOCOBALAMIN  Take 100 mcg by mouth daily.     vitamin C 500 MG tablet  Commonly known as:  ASCORBIC ACID  Take 500 mg by mouth daily.     vitamin E 400 UNIT capsule  Take 400 Units by mouth  daily.           Objective:    BP 130/72 mmHg  Pulse 84  Temp(Src) 100.4 F (38 C) (Oral)  Ht 5\' 4"  (1.626 m)  Wt 180 lb 6.4 oz (81.829 kg)  BMI 30.95 kg/m2  Wt Readings from Last 3 Encounters:  05/29/16 180 lb 6.4 oz (81.829 kg)  04/13/16 177 lb 12.8 oz (80.65 kg)  03/02/16 174 lb 3.2 oz (79.017 kg)    Physical Exam  Constitutional: She is oriented to person, place, and time. She appears well-developed and well-nourished. No distress.  Eyes: Conjunctivae and EOM are normal. Pupils are equal, round, and reactive to light.  Cardiovascular: Normal rate, regular rhythm, normal heart sounds and intact distal pulses.   No murmur heard. Pulmonary/Chest: Effort normal and breath sounds normal. No respiratory distress. She has no wheezes.  Musculoskeletal: Normal range of motion. She exhibits no edema or tenderness.  Neurological: She is alert and oriented to person, place, and time. Coordination normal.  Skin: Skin is warm and dry. Lesion noted. No rash noted. She is not diaphoretic. There is erythema (Swelling and erythema on medial aspect of left foot. No fluctuation or induration on wound).  Psychiatric: She has a normal mood and affect. Her behavior is normal.  Nursing note and vitals reviewed.     Assessment & Plan:   Problem List Items Addressed This Visit    None    Visit Diagnoses    Cellulitis of left lower extremity    -  Primary    Left foot cellulitis, fever, treat with doxycycline, also possible for gout but patient has low-grade fever so we'll treat like cellulitis    Relevant Medications    doxycycline (VIBRA-TABS) 100 MG tablet        Follow up plan: Return if symptoms worsen or fail to improve.  Counseling provided for all of the vaccine components No orders of the defined types were placed in this encounter.    Caryl Pina, MD [facility] 05/29/2016, 4:06 PM

## 2016-06-05 ENCOUNTER — Other Ambulatory Visit: Payer: Self-pay | Admitting: Family Medicine

## 2016-06-05 MED ORDER — FLUCONAZOLE 150 MG PO TABS
150.0000 mg | ORAL_TABLET | Freq: Once | ORAL | Status: DC
Start: 2016-06-05 — End: 2016-06-06

## 2016-06-05 NOTE — Telephone Encounter (Signed)
Sent Rx for diflucan, needs to have OV for refill of hydrocodne.    Laroy Apple, MD [facility] 06/05/2016, 5:39 PM

## 2016-06-05 NOTE — Telephone Encounter (Signed)
Last seen 04/13/16  Last filled 04/14/16

## 2016-06-06 ENCOUNTER — Encounter: Payer: Self-pay | Admitting: Family Medicine

## 2016-06-06 ENCOUNTER — Ambulatory Visit (INDEPENDENT_AMBULATORY_CARE_PROVIDER_SITE_OTHER): Payer: Medicare Other | Admitting: Family Medicine

## 2016-06-06 VITALS — BP 114/67 | HR 63 | Temp 97.8°F | Ht 64.0 in | Wt 178.6 lb

## 2016-06-06 DIAGNOSIS — E039 Hypothyroidism, unspecified: Secondary | ICD-10-CM | POA: Diagnosis not present

## 2016-06-06 DIAGNOSIS — M1712 Unilateral primary osteoarthritis, left knee: Secondary | ICD-10-CM | POA: Diagnosis not present

## 2016-06-06 DIAGNOSIS — Z79899 Other long term (current) drug therapy: Secondary | ICD-10-CM

## 2016-06-06 DIAGNOSIS — M1990 Unspecified osteoarthritis, unspecified site: Secondary | ICD-10-CM | POA: Diagnosis not present

## 2016-06-06 DIAGNOSIS — Z0289 Encounter for other administrative examinations: Secondary | ICD-10-CM | POA: Insufficient documentation

## 2016-06-06 MED ORDER — LEVOTHYROXINE SODIUM 100 MCG PO TABS: 100.0000 ug | ORAL_TABLET | Freq: Every day | ORAL | Status: DC

## 2016-06-06 MED ORDER — FLUCONAZOLE 150 MG PO TABS: 150.0000 mg | ORAL_TABLET | Freq: Once | ORAL | Status: DC

## 2016-06-06 MED ORDER — HYDROCODONE-ACETAMINOPHEN 7.5-325 MG PO TABS: 0.5000 | ORAL_TABLET | Freq: Four times a day (QID) | ORAL | Status: DC | PRN

## 2016-06-06 NOTE — Telephone Encounter (Signed)
Patient aware, appointment made today at 3:25 pm

## 2016-06-06 NOTE — Telephone Encounter (Signed)
Patent aware

## 2016-06-06 NOTE — Progress Notes (Signed)
   HPI  Patient presents today here for refill pain medications, follow up cellulitis, and use infection.  Patient has vaginal itching consistent with yeast infection after starting doxycycline. She's taken 1 pill a fluconazole, each she usually requires a follow-up pill.  Cellulitis Improving, feeling better. Tolerating doxycycline easily.  Pain Chronic left knee pain, she has been taking less pain medication lately.  She has lost her Synthroid, she needs a refill.  PMH: Smoking status noted ROS: Per HPI  Objective: BP 114/67 mmHg  Pulse 63  Temp(Src) 97.8 F (36.6 C) (Oral)  Ht 5\' 4"  (1.626 m)  Wt 178 lb 9.6 oz (81.012 kg)  BMI 30.64 kg/m2 Gen: NAD, alert, cooperative with exam HEENT: NCAT, CV: RRR, good S1/S2, no murmur Resp: CTABL, no wheezes, non-labored Ext: No edema, warm, area of previous cellulitis appears to be completely resolved, no redness, tenderness, or swelling Neuro: Alert and oriented, No gross deficits  Assessment and plan:  # Cellulitis of left lower extremity Improved, she still has a few days of antibiotics left. Finish antibiotics  # Left knee osteoarthritis, chronic pain, Refill hydrocodone per usual dose, she's not been using is much lately, I congratulated her on this.  # Hypothyroidism Refilled Synthroid 3 months Labs due in September/October     Meds ordered this encounter  Medications  . HYDROcodone-acetaminophen (NORCO) 7.5-325 MG tablet    Sig: Take 0.5-1 tablets by mouth every 6 (six) hours as needed for moderate pain.    Dispense:  45 tablet    Refill:  0    Please do not fill until 30 days after the previous fill.  . levothyroxine (SYNTHROID, LEVOTHROID) 100 MCG tablet    Sig: Take 1 tablet (100 mcg total) by mouth daily before breakfast.    Dispense:  90 tablet    Refill:  0  . fluconazole (DIFLUCAN) 150 MG tablet    Sig: Take 1 tablet (150 mg total) by mouth once. Repeat in 3 days    Dispense:  2 tablet    Refill:  [place], MD [facility] 06/06/2016, 3:41 PM

## 2016-06-06 NOTE — Patient Instructions (Signed)
Great to see you!  Lets follow up in September unless you need [place] sooner.

## 2016-06-13 NOTE — Telephone Encounter (Signed)
Medication was filled 06-06-2016.

## 2016-06-30 ENCOUNTER — Ambulatory Visit (INDEPENDENT_AMBULATORY_CARE_PROVIDER_SITE_OTHER): Payer: Medicare Other | Admitting: Family Medicine

## 2016-06-30 ENCOUNTER — Encounter: Payer: Self-pay | Admitting: Family Medicine

## 2016-06-30 VITALS — BP 144/77 | HR 65 | Temp 96.7°F | Ht 64.0 in | Wt 177.0 lb

## 2016-06-30 DIAGNOSIS — W57XXXA Bitten or stung by nonvenomous insect and other nonvenomous arthropods, initial encounter: Secondary | ICD-10-CM

## 2016-06-30 DIAGNOSIS — M79605 Pain in left leg: Secondary | ICD-10-CM | POA: Diagnosis not present

## 2016-06-30 DIAGNOSIS — I8393 Asymptomatic varicose veins of bilateral lower extremities: Secondary | ICD-10-CM | POA: Diagnosis not present

## 2016-06-30 DIAGNOSIS — T148 Other injury of unspecified body region: Secondary | ICD-10-CM

## 2016-06-30 DIAGNOSIS — E785 Hyperlipidemia, unspecified: Secondary | ICD-10-CM | POA: Diagnosis not present

## 2016-06-30 LAB — CMP14+EGFR
ALT: 17 IU/L (ref 0–32)
AST: 14 IU/L (ref 0–40)
Albumin/Globulin Ratio: 1.8 (ref 1.2–2.2)
Albumin: 4.2 g/dL (ref 3.5–4.8)
Alkaline Phosphatase: 86 IU/L (ref 39–117)
BUN/Creatinine Ratio: 11 — ABNORMAL LOW (ref 12–28)
BUN: 7 mg/dL — ABNORMAL LOW (ref 8–27)
Bilirubin Total: 0.3 mg/dL (ref 0.0–1.2)
CO2: 24 mmol/L (ref 18–29)
Calcium: 9.7 mg/dL (ref 8.7–10.3)
Chloride: 98 mmol/L (ref 96–106)
Creatinine, Ser: 0.63 mg/dL (ref 0.57–1.00)
GFR calc Af Amer: 101 mL/min/{1.73_m2} (ref >59)
GFR calc non Af Amer: 88 mL/min/{1.73_m2} (ref >59)
Globulin, Total: 2.4 g/dL (ref 1.5–4.5)
Glucose: 91 mg/dL (ref 65–99)
Potassium: 4.4 mmol/L (ref 3.5–5.2)
Sodium: 139 mmol/L (ref 134–144)
Total Protein: 6.6 g/dL (ref 6.0–8.5)

## 2016-06-30 LAB — LIPID PANEL
Chol/HDL Ratio: 4.4 ratio units (ref 0.0–4.4)
Cholesterol, Total: 235 mg/dL — ABNORMAL HIGH (ref 100–199)
HDL: 53 mg/dL (ref >39)
LDL Calculated: 152 mg/dL — ABNORMAL HIGH (ref 0–99)
Triglycerides: 152 mg/dL — ABNORMAL HIGH (ref 0–149)
VLDL Cholesterol Cal: 30 mg/dL (ref 5–40)

## 2016-06-30 MED ORDER — TRIAMCINOLONE ACETONIDE 0.1 % EX CREA: 1.0000 "application " | TOPICAL_CREAM | Freq: Two times a day (BID) | CUTANEOUS | Status: DC

## 2016-06-30 NOTE — Progress Notes (Signed)
Subjective:    Patient ID: Danielle Parrish, female    DOB: 01/15/1941, 75 y.o.   MRN: [id number]  HPI Patient here today for continued left leg pain and discomfort. Patient has had a ongoing problem with leg pain. She has been treated recently for arthritis and cellulitis. Redness in her feet comes and goes and really sounds more like a stasis dermatitis and cellulitis or gallops which she has also had. 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  Depression screen [facility] 2/9 06/30/2016 06/06/2016 05/29/2016 03/02/2016 12/02/2015  Decreased Interest 0 0 0 0 0  Down, Depressed, Hopeless 0 0 0 0 0  PHQ - 2 Score 0 0 0 0 0  Altered sleeping - - - - -  Tired, decreased energy - - - - -  Change in appetite - - - - -  Feeling bad or failure about yourself  - - - - -  Trouble concentrating - - - - -  Moving slowly or fidgety/restless - - - - -  Suicidal thoughts - - - - -  PHQ-9 Score - - - - -        Patient Active Problem List   Diagnosis Date Noted  . Pain medication agreement signed 06/06/2016  . Gout attack 01/11/2016  . OSA (obstructive sleep apnea) 09/14/2015  . Chronic diastolic heart failure ([place]) 09/10/2015  . Orthopnea 09/02/2015  . Dysphagia, pharyngoesophageal phase 08/04/2015  . Anorexia 08/04/2015  . Fatigue 08/04/2015  . Malaise and fatigue 08/04/2015  . Depression 04/08/2015  . Osteoarthritis of left knee 09/30/2014  . Vitamin D deficiency   . Anxiety   . GERD (gastroesophageal reflux disease)   . Arthritis   . Chronic pain of multiple joints   . HTN (hypertension) 05/15/2013  . HLD (hyperlipidemia) 05/15/2013  . Prediabetes 05/15/2013  . Obesity 05/15/2013  . Hypothyroidism 05/15/2013   Outpatient Encounter Prescriptions as of 06/30/2016  Medication Sig  . amLODipine (NORVASC) 10 MG tablet Take 1 tablet (10 mg total) by mouth daily.  . Biotin 10 MG CAPS Take by mouth.  . diphenhydrAMINE (BENADRYL) 25  MG tablet Take 50 mg by mouth at bedtime.  . Ferrous Sulfate 90 (18 FE) MG TABS Take 1 tablet by mouth every morning.  . furosemide (LASIX) 20 MG tablet Take 1 tablet (20 mg total) by mouth daily.  Marland Kitchen gabapentin (NEURONTIN) 100 MG capsule TAKE 1 CAPSULE AT BEDTIME FOR 7 DAYS, THEN 1 CAPSULE TWICE DAILY  . HYDROcodone-acetaminophen (NORCO) 7.5-325 MG tablet Take 0.5-1 tablets by mouth every 6 (six) hours as needed for moderate pain.  Marland Kitchen levothyroxine (SYNTHROID, LEVOTHROID) 100 MCG tablet Take 1 tablet (100 mcg total) by mouth daily before breakfast.  . Multiple Vitamin (MULTIVITAMIN) tablet Take 1 tablet by mouth every morning.   . niacin 500 MG tablet Take 500 mg by mouth at bedtime.  Marland Kitchen omeprazole (PRILOSEC) 40 MG capsule Take 1 capsule (40 mg total) by mouth every morning.  . pyridOXINE (VITAMIN B-6) 100 MG tablet Take 100 mg by mouth daily.  . sertraline (ZOLOFT) 100 MG tablet Take 1 tablet (100 mg total) by mouth every morning.  . sodium chloride (OCEAN) 0.65 % SOLN nasal spray Place 1 spray into both nostrils as needed for congestion.  . vitamin B-12 (CYANOCOBALAMIN) 100 MCG tablet Take 100 mcg by mouth daily.  . vitamin C (ASCORBIC ACID) 500 MG tablet Take 500 mg  by mouth daily.  . vitamin E 400 UNIT capsule Take 400 Units by mouth daily.  . [DISCONTINUED] doxycycline (VIBRA-TABS) 100 MG tablet Take 1 tablet (100 mg total) by mouth 2 (two) times daily. 1 po bid  . [DISCONTINUED] fluconazole (DIFLUCAN) 150 MG tablet Take 1 tablet (150 mg total) by mouth once. Repeat in 3 days   No facility-administered encounter medications on file as of 06/30/2016.      Review of Systems  Constitutional: Negative.   HENT: Negative.   Eyes: Negative.   Respiratory: Negative.   Cardiovascular: Negative.   Gastrointestinal: Negative.   Endocrine: Negative.   Genitourinary: Negative.   Musculoskeletal: Positive for arthralgias (left leg - mostly lower).  Skin: Negative.   Allergic/Immunologic:  Negative.   Neurological: Negative.   Hematological: Negative.   Psychiatric/Behavioral: Negative.        Objective:   Physical Exam  Constitutional: She appears well-developed and well-nourished.  Cardiovascular: Normal rate and normal heart sounds.   Musculoskeletal:  There are obvious visible and palpable varicosities in both legs. I think a lot of her symptoms related to her legs are related to this venous disease and wonder if she would benefit from some sort of intervention. Would like to send her to vascular surgery for consultation. She does wear support stockings when she is working.   BP 144/77 mmHg  Pulse 65  Temp(Src) 96.7 F (35.9 C) (Oral)  Ht '5\' 4"'  (1.626 m)  Wt 177 lb (80.287 kg)  BMI 30.37 kg/m2        Assessment & Plan:  1. Left leg pain Pain likely related to multiple factors including arthritis as well as venous disease; referral to vascular surgery - CMP14+EGFR - Lipid panel - Ambulatory referral to Vascular Surgery  2. Tick bite Asymptomatic at this time  3. Varicose veins of legs See above for comments. Did provide Rx for triamcinolone cream to use on her legs and feet for dermatitis - Ambulatory referral to Vascular Surgery  Wardell Honour MD

## 2016-07-03 ENCOUNTER — Other Ambulatory Visit: Payer: Self-pay | Admitting: Family Medicine

## 2016-07-03 ENCOUNTER — Telehealth: Payer: Self-pay | Admitting: Family Medicine

## 2016-07-03 DIAGNOSIS — M1990 Unspecified osteoarthritis, unspecified site: Secondary | ICD-10-CM

## 2016-07-04 NOTE — Telephone Encounter (Signed)
Appointment made 7/13 at 7:55.

## 2016-07-04 NOTE — Telephone Encounter (Signed)
Last seen and filled on 06/06/16

## 2016-07-06 ENCOUNTER — Ambulatory Visit (INDEPENDENT_AMBULATORY_CARE_PROVIDER_SITE_OTHER): Payer: Medicare Other | Admitting: Family Medicine

## 2016-07-06 ENCOUNTER — Encounter: Payer: Self-pay | Admitting: Family Medicine

## 2016-07-06 VITALS — BP 127/73 | HR 66 | Temp 97.3°F | Ht 64.0 in | Wt 175.6 lb

## 2016-07-06 DIAGNOSIS — F39 Unspecified mood [affective] disorder: Secondary | ICD-10-CM | POA: Diagnosis not present

## 2016-07-06 DIAGNOSIS — Z0289 Encounter for other administrative examinations: Secondary | ICD-10-CM

## 2016-07-06 DIAGNOSIS — F329 Major depressive disorder, single episode, unspecified: Secondary | ICD-10-CM

## 2016-07-06 DIAGNOSIS — M1990 Unspecified osteoarthritis, unspecified site: Secondary | ICD-10-CM

## 2016-07-06 DIAGNOSIS — M2550 Pain in unspecified joint: Secondary | ICD-10-CM

## 2016-07-06 DIAGNOSIS — E039 Hypothyroidism, unspecified: Secondary | ICD-10-CM

## 2016-07-06 DIAGNOSIS — I10 Essential (primary) hypertension: Secondary | ICD-10-CM

## 2016-07-06 DIAGNOSIS — Z79899 Other long term (current) drug therapy: Secondary | ICD-10-CM | POA: Diagnosis not present

## 2016-07-06 DIAGNOSIS — G8929 Other chronic pain: Secondary | ICD-10-CM | POA: Diagnosis not present

## 2016-07-06 DIAGNOSIS — E785 Hyperlipidemia, unspecified: Secondary | ICD-10-CM

## 2016-07-06 DIAGNOSIS — F32A Depression, unspecified: Secondary | ICD-10-CM

## 2016-07-06 MED ORDER — GABAPENTIN 100 MG PO CAPS: 100.0000 mg | ORAL_CAPSULE | Freq: Two times a day (BID) | ORAL | Status: DC

## 2016-07-06 MED ORDER — LOVASTATIN 40 MG PO TABS: 40.0000 mg | ORAL_TABLET | Freq: Every day | ORAL | Status: DC

## 2016-07-06 MED ORDER — LEVOTHYROXINE SODIUM 100 MCG PO TABS: 100.0000 ug | ORAL_TABLET | Freq: Every day | ORAL | Status: DC

## 2016-07-06 MED ORDER — AMLODIPINE BESYLATE 10 MG PO TABS: 10.0000 mg | ORAL_TABLET | Freq: Every day | ORAL | Status: DC

## 2016-07-06 MED ORDER — SERTRALINE HCL 100 MG PO TABS: 100.0000 mg | ORAL_TABLET | ORAL | Status: DC

## 2016-07-06 MED ORDER — HYDROCODONE-ACETAMINOPHEN 7.5-325 MG PO TABS: 0.5000 | ORAL_TABLET | Freq: Four times a day (QID) | ORAL | Status: DC | PRN

## 2016-07-06 NOTE — Progress Notes (Signed)
   HPI  Patient presents today here for pain medication refill.  Patient has a long history of chronic pain treated with narcotics area she waxes and wanes in her use, recently she's had increased leg pain and has used more medication than usual. Still, itsl been one month since her last refill. Her leg pain today seems to be improving slightly, she's been referred to vascular surgeon for evaluation a few days ago by one of my partners.  Depression Doing well, denies anhedonia and feelings of depression Denies suicidal thoughts.  Hypertension No problems with medication compliance No chest pain, dyspnea, palpitations Leg edema is resolving and waxing waning.    PMH: Smoking status noted ROS: Per HPI  Objective: BP 127/73 mmHg  Pulse 66  Temp(Src) 97.3 F (36.3 C) (Oral)  Ht 5\' 4"  (1.626 m)  Wt 175 lb 9.6 oz (79.652 kg)  BMI 30.13 kg/m2 Gen: NAD, alert, cooperative with exam HEENT: NCAT CV: RRR, good S1/S2, no murmur Resp: CTABL, no wheezes, non-labored Ext: No edema, warm Neuro: Alert and oriented, No gross deficits  Assessment and plan:  # Hypertension Well-controlled on amlodipine, continue Refilled  # HLD Checked last week Restart lovastatin, LDL 151  # Chronic pain Waxing and waining, currently slightly worse than usual Medications improve functional status in her case Refill Norco, increased to account by 15, given 7.5 mg pills times #60  # Depression Controlled Refilled Zoloft, doing well No suicidal ideation    Meds ordered this encounter  Medications  . HYDROcodone-acetaminophen (NORCO) 7.5-325 MG tablet    Sig: Take 0.5-1 tablets by mouth every 6 (six) hours as needed for moderate pain.    Dispense:  60 tablet    Refill:  0    Please do not fill until 30 days after the previous fill.  . sertraline (ZOLOFT) 100 MG tablet    Sig: Take 1 tablet (100 mg total) by mouth every morning.    Dispense:  90 tablet    Refill:  3  . amLODipine  (NORVASC) 10 MG tablet    Sig: Take 1 tablet (10 mg total) by mouth daily.    Dispense:  90 tablet    Refill:  0  . gabapentin (NEURONTIN) 100 MG capsule    Sig: Take 1 capsule (100 mg total) by mouth 2 (two) times daily.    Dispense:  180 capsule    Refill:  0  . lovastatin (MEVACOR) 40 MG tablet    Sig: Take 1 tablet (40 mg total) by mouth at bedtime.    Dispense:  90 tablet    Refill:  3  . levothyroxine (SYNTHROID, LEVOTHROID) 100 MCG tablet    Sig: Take 1 tablet (100 mcg total) by mouth daily before breakfast.    Dispense:  90 tablet    Refill:  [place], MD [facility] 07/06/2016, 8:08 AM

## 2016-07-06 NOTE — Patient Instructions (Signed)
Great to see you!  Lets see you again in 3 months (unless you need [place] sooner) and repeat your labs for high cholesterol

## 2016-07-27 ENCOUNTER — Encounter: Payer: Self-pay | Admitting: Vascular Surgery

## 2016-07-31 ENCOUNTER — Encounter: Payer: Self-pay | Admitting: Vascular Surgery

## 2016-07-31 ENCOUNTER — Ambulatory Visit (INDEPENDENT_AMBULATORY_CARE_PROVIDER_SITE_OTHER): Payer: Medicare Other | Admitting: Vascular Surgery

## 2016-07-31 VITALS — BP 146/84 | HR 66 | Ht 64.0 in | Wt 177.8 lb

## 2016-07-31 DIAGNOSIS — I83893 Varicose veins of bilateral lower extremities with other complications: Secondary | ICD-10-CM

## 2016-07-31 DIAGNOSIS — I83892 Varicose veins of left lower extremities with other complications: Secondary | ICD-10-CM | POA: Insufficient documentation

## 2016-07-31 NOTE — Progress Notes (Signed)
Subjective:     Patient ID: Danielle Parrish, female   DOB: [birth year], 75 y.o.   MRN: [id number]  HPI this 75 year old female was referred by Dr. Sabra Heck at [facility] him family medicine for evaluation of painful varicose veins and swelling in both legs. She states varicosities or been present for many years which have become increasingly painful and larger over the last 12 months. She has developed swelling in both ankles as the day progresses. She has no history of DVT thrombophlebitis stasis ulcers or bleeding. She does wear long leg elastic compression stockings which have not relieved her symptoms in his worn these for the past 2-3 years. Symptoms are beginning to affect her daily living.  Past Medical History:  Diagnosis Date  . Anxiety   . Arthritis   . CHF (congestive heart failure) ([place])   . Chronic pain of multiple joints   . Depression   . Essential hypertension   . GERD (gastroesophageal reflux disease)   . Hyperlipidemia   . Hypothyroidism   . Osteoporosis   . Sleep apnea    CPAP  . Vitamin D deficiency     Social History  Substance Use Topics  . Smoking status: Never Smoker  . Smokeless tobacco: Never Used  . Alcohol use No    Family History  Problem Relation Age of Onset  . Hypertension Mother   . Stroke Mother   . Cancer Father     Allergies  Allergen Reactions  . Crestor [Rosuvastatin] Other (See Comments)    Body aches.   . Nsaids     Burns stomach.   . Penicillins Hives    Has patient had a PCN reaction causing immediate rash, facial/tongue/throat swelling, SOB or lightheadedness with hypotension: Yes Has patient had a PCN reaction causing severe rash involving mucus membranes or skin necrosis: unknown Has patient had a PCN reaction that required hospitalization No Has patient had a PCN reaction occurring within the last 10 years: No If all of the above answers are "NO", then may proceed with Cephalosporin use.   . Vytorin [Ezetimibe-Simvastatin]      Body aches.      Current Outpatient Prescriptions:  .  amLODipine (NORVASC) 10 MG tablet, Take 1 tablet (10 mg total) by mouth daily., Disp: 90 tablet, Rfl: 0 .  Biotin 10 MG CAPS, Take by mouth., Disp: , Rfl:  .  Cholecalciferol (VITAMIN D-3 PO), Take by mouth., Disp: , Rfl:  .  diphenhydrAMINE (BENADRYL) 25 MG tablet, Take 50 mg by mouth at bedtime., Disp: , Rfl:  .  fenofibrate 160 MG tablet, Take 160 mg by mouth daily., Disp: , Rfl:  .  Ferrous Sulfate 90 (18 FE) MG TABS, Take 1 tablet by mouth every morning., Disp: 90 each, Rfl: 1 .  furosemide (LASIX) 20 MG tablet, Take 1 tablet (20 mg total) by mouth daily., Disp: 30 tablet, Rfl: 5 .  gabapentin (NEURONTIN) 100 MG capsule, Take 1 capsule (100 mg total) by mouth 2 (two) times daily., Disp: 180 capsule, Rfl: 0 .  HYDROcodone-acetaminophen (NORCO) 7.5-325 MG tablet, Take 0.5-1 tablets by mouth every 6 (six) hours as needed for moderate pain., Disp: 60 tablet, Rfl: 0 .  levothyroxine (SYNTHROID, LEVOTHROID) 100 MCG tablet, Take 1 tablet (100 mcg total) by mouth daily before breakfast., Disp: 90 tablet, Rfl: 3 .  lovastatin (MEVACOR) 40 MG tablet, Take 1 tablet (40 mg total) by mouth at bedtime., Disp: 90 tablet, Rfl: 3 .  Multiple Vitamin (MULTIVITAMIN) tablet,  Take 1 tablet by mouth every morning. , Disp: , Rfl:  .  niacin 500 MG tablet, Take 500 mg by mouth at bedtime., Disp: , Rfl:  .  omeprazole (PRILOSEC) 40 MG capsule, Take 1 capsule (40 mg total) by mouth every morning., Disp: 90 capsule, Rfl: 1 .  pyridOXINE (VITAMIN B-6) 100 MG tablet, Take 100 mg by mouth daily., Disp: , Rfl:  .  sertraline (ZOLOFT) 100 MG tablet, Take 1 tablet (100 mg total) by mouth every morning., Disp: 90 tablet, Rfl: 3 .  sodium chloride (OCEAN) 0.65 % SOLN nasal spray, Place 1 spray into both nostrils as needed for congestion., Disp: , Rfl:  .  triamcinolone cream (KENALOG) 0.1 %, Apply 1 application topically 2 (two) times daily., Disp: 30 g, Rfl: 1 .   vitamin B-12 (CYANOCOBALAMIN) 100 MCG tablet, Take 100 mcg by mouth daily., Disp: , Rfl:  .  vitamin C (ASCORBIC ACID) 500 MG tablet, Take 500 mg by mouth daily., Disp: , Rfl:  .  vitamin E 400 UNIT capsule, Take 400 Units by mouth daily., Disp: , Rfl:   Vitals:   07/31/16 1256 07/31/16 1258  BP: (!) 147/87 (!) 146/84  Pulse: 66   SpO2: 94%   Weight: 177 lb 12.8 oz (80.6 kg)   Height: 5\' 4"  (1.626 m)     Body mass index is 30.52 kg/m.          Review of Systems denies chest pain but does have dyspnea on exertion. Has history of congestive heart failure over the past several years treated with a diuretic. Also has history of hypertension, hyperlipidemia, GERD, sleep apnea. Able to ambulate about one block. Denies claudication symptoms. Other systems negative and complete review of systems     Objective:   Physical Exam BP (!) 146/84 (BP Location: Left Arm, Patient Position: Sitting, Cuff Size: Normal) Comment: recheck  Pulse 66   Ht 5\' 4"  (1.626 m)   Wt 177 lb 12.8 oz (80.6 kg)   SpO2 94%   BMI 30.52 kg/m     Gen.-alert and oriented x3 in no apparent distress HEENT normal for age Lungs no rhonchi or wheezing Cardiovascular regular rhythm no murmurs carotid pulses 3+ palpable no bruits audible Abdomen soft nontender no palpable masses Musculoskeletal free of  major deformities Skin clear -no rashes Neurologic normal Lower extremities 3+ femoral and dorsalis pedis pulses palpable bilaterally with 1+ edema bilaterally Bulging varicosities in both legs in the medial distal thigh and medial calf extending posteriorly and the great saphenous distribution. Early hyperpigmentation distally. No active ulcer noted.  Today I performed a bedside SonoSite ultrasound exam. This revealed both great saphenous veins to be enlarged with suspected reflux right greater than left supplying these painful varicosities       Assessment:     #1 painful varicosities and swelling  bilaterally due to suspected gross reflux bilateral great saphenous veins which are affecting patient's daily living #2 hypertension #3 hyperlipidemia #4 GERD #5 history of sleep apnea #6 history of congestive heart failure but no myocardial infarction    Plan:         #1 long leg elastic compression stockings 20-30 mm gradient #2 elevate legs as much as possible #3 ibuprofen daily on a regular basis for pain #4 return in 3 months-formal venous duplex exam will be performed at that time to assess for reflux in the great and small saphenous veins bilaterally We'll then make formal recommendation regarding treatment options pending on patient's  symptomatology at that time

## 2016-08-14 NOTE — Addendum Note (Signed)
Addended by: Mena Goes on: 08/14/2016 05:34 PM   Modules accepted: Orders

## 2016-08-21 ENCOUNTER — Encounter (HOSPITAL_COMMUNITY): Payer: Medicare Other

## 2016-08-30 ENCOUNTER — Other Ambulatory Visit: Payer: Self-pay | Admitting: Family Medicine

## 2016-08-30 NOTE — Telephone Encounter (Signed)
Is the policy that only he refills his chronic pain meds? Or since I am covering I do this?

## 2016-09-01 NOTE — Telephone Encounter (Signed)
Needs to be seen for refills of controlled substances.   Laroy Apple, MD [facility] 09/01/2016, 12:07 PM

## 2016-09-01 NOTE — Telephone Encounter (Signed)
Pt notified will NTBS Pt stated she does not have car at this time and is unable to come in for appt Will call back to schedule

## 2016-09-14 ENCOUNTER — Ambulatory Visit (INDEPENDENT_AMBULATORY_CARE_PROVIDER_SITE_OTHER): Payer: Medicare Other | Admitting: Family Medicine

## 2016-09-14 ENCOUNTER — Encounter: Payer: Self-pay | Admitting: Family Medicine

## 2016-09-14 VITALS — BP 136/78 | HR 62 | Temp 96.8°F | Ht 64.0 in | Wt 176.2 lb

## 2016-09-14 DIAGNOSIS — M1712 Unilateral primary osteoarthritis, left knee: Secondary | ICD-10-CM

## 2016-09-14 DIAGNOSIS — I10 Essential (primary) hypertension: Secondary | ICD-10-CM | POA: Diagnosis not present

## 2016-09-14 DIAGNOSIS — E785 Hyperlipidemia, unspecified: Secondary | ICD-10-CM

## 2016-09-14 DIAGNOSIS — Z23 Encounter for immunization: Secondary | ICD-10-CM

## 2016-09-14 DIAGNOSIS — Z0289 Encounter for other administrative examinations: Secondary | ICD-10-CM

## 2016-09-14 DIAGNOSIS — Z79899 Other long term (current) drug therapy: Secondary | ICD-10-CM

## 2016-09-14 DIAGNOSIS — M1990 Unspecified osteoarthritis, unspecified site: Secondary | ICD-10-CM | POA: Diagnosis not present

## 2016-09-14 MED ORDER — HYDROCODONE-ACETAMINOPHEN 7.5-325 MG PO TABS: 0.5000 | ORAL_TABLET | Freq: Four times a day (QID) | ORAL | 0 refills | Status: DC | PRN

## 2016-09-14 MED ORDER — DICLOFENAC SODIUM 1 % TD GEL: 2.0000 g | Freq: Four times a day (QID) | TRANSDERMAL | 5 refills | Status: DC

## 2016-09-14 MED ORDER — NYSTATIN 100000 UNIT/GM EX CREA: 1.0000 "application " | TOPICAL_CREAM | Freq: Two times a day (BID) | CUTANEOUS | 0 refills | Status: DC

## 2016-09-14 NOTE — Addendum Note (Signed)
Addended by: Karle Plumber on: 09/14/2016 08:35 AM   Modules accepted: Orders

## 2016-09-14 NOTE — Progress Notes (Signed)
   HPI  Patient presents today here for refill of hydrocodone, rash, knee pain, and chronic medical conditions.  Patient takes hydrocodone intermittently for chronic left knee and other chronic joint pains. She also requests for prescription for Voltaren gel to treat left knee pain. She has a friend who gets very good results with it.  She's had 3-4 days of itching under her bilateral breast and under her abdominal skin fold, she believes is a yeast infection.  Hyperlipidemia Has started taking medication that she had leftover at home, cannot remember the name.  Hypertension Good medication compliance No chest pain, dyspnea, palpitations  PMH: Smoking status noted ROS: Per HPI  Objective: BP 136/78   Pulse 62   Temp (!) 96.8 F (36 C) (Oral)   Ht 5\' 4"  (1.626 m)   Wt 176 lb 3.2 oz (79.9 kg)   BMI 30.24 kg/m  Gen: NAD, alert, cooperative with exam HEENT: NCAT, mild conjunctival injection bilaterally Ext: No edema, warm Neuro: Alert and oriented, No gross deficits  Skin: Erythematous maculopapular rash with satellite lesions under right breast, exam performed with my CMA present Janett Billow [place])  Assessment and plan:  # Chronic joint pain, pain medication agreement Refilled hydrocodone, she has reduced her use slowly over the last year. Also having Voltaren gel for left knee pain. [place] controlled substance database reviewed and it is consistent with what we have prescribed  # Hyperlipidemia Previous LDL elevated to the 150s, patient has started taking cholesterol medication, statin, that she had a home. Tolerating well. Repeat labs, patient will call back with name medication  # Hypertension Good medication compliance, well controlled  # HCM Flu shot today  Rash Intertrigo, yeast candidiasis Treat with nystatin cream    Orders Placed This Encounter  Procedures  . COMPLETE METABOLIC PANEL WITH GFR  . Lipid panel  . CBC with Differential     Meds ordered this encounter  Medications  . HYDROcodone-acetaminophen (NORCO) 7.5-325 MG tablet    Sig: Take 0.5-1 tablets by mouth every 6 (six) hours as needed for moderate pain.    Dispense:  60 tablet    Refill:  0    Please do not fill until 30 days after the previous fill.  . nystatin cream (MYCOSTATIN)    Sig: Apply 1 application topically 2 (two) times daily.    Dispense:  30 g    Refill:  0  . diclofenac sodium (VOLTAREN) 1 % GEL    Sig: Apply 2 g topically 4 (four) times daily.    Dispense:  100 g    Refill:  [place], MD [facility] 09/14/2016, 8:23 AM

## 2016-09-14 NOTE — Patient Instructions (Signed)
Great to see you!  I have sent nystatin for your rash, Voltaren gel for your knee pain, and I have refilled your hydrocodone.  Was follow-up in 2-3 months for chronic pain

## 2016-09-15 ENCOUNTER — Ambulatory Visit: Payer: Medicare Other | Admitting: Family Medicine

## 2016-09-15 LAB — CBC WITH DIFFERENTIAL/PLATELET
Basophils Absolute: 0 10*3/uL (ref 0.0–0.2)
Basos: 1 %
EOS (ABSOLUTE): 0.1 10*3/uL (ref 0.0–0.4)
Eos: 1 %
Hematocrit: 43.8 % (ref 34.0–46.6)
Hemoglobin: 14.1 g/dL (ref 11.1–15.9)
Immature Grans (Abs): 0 10*3/uL (ref 0.0–0.1)
Immature Granulocytes: 0 %
Lymphocytes Absolute: 2.5 10*3/uL (ref 0.7–3.1)
Lymphs: 44 %
MCH: 30.1 pg (ref 26.6–33.0)
MCHC: 32.2 g/dL (ref 31.5–35.7)
MCV: 93 fL (ref 79–97)
Monocytes Absolute: 0.5 10*3/uL (ref 0.1–0.9)
Monocytes: 8 %
Neutrophils Absolute: 2.6 10*3/uL (ref 1.4–7.0)
Neutrophils: 46 %
Platelets: 266 10*3/uL (ref 150–379)
RBC: 4.69 x10E6/uL (ref 3.77–5.28)
RDW: 13.9 % (ref 12.3–15.4)
WBC: 5.7 10*3/uL (ref 3.4–10.8)

## 2016-09-15 LAB — LIPID PANEL
Chol/HDL Ratio: 2.5 ratio units (ref 0.0–4.4)
Cholesterol, Total: 144 mg/dL (ref 100–199)
HDL: 58 mg/dL (ref >39)
LDL Calculated: 72 mg/dL (ref 0–99)
Triglycerides: 72 mg/dL (ref 0–149)
VLDL Cholesterol Cal: 14 mg/dL (ref 5–40)

## 2016-09-19 LAB — CMP14+EGFR
ALT: 25 IU/L (ref 0–32)
AST: 25 IU/L (ref 0–40)
Albumin/Globulin Ratio: 1.6 (ref 1.2–2.2)
Albumin: 4.4 g/dL (ref 3.5–4.8)
Alkaline Phosphatase: 59 IU/L (ref 39–117)
BUN/Creatinine Ratio: 16 (ref 12–28)
BUN: 13 mg/dL (ref 8–27)
Bilirubin Total: <0.2 mg/dL (ref 0.0–1.2)
CO2: 24 mmol/L (ref 18–29)
Calcium: 10.2 mg/dL (ref 8.7–10.3)
Chloride: 100 mmol/L (ref 96–106)
Creatinine, Ser: 0.83 mg/dL (ref 0.57–1.00)
GFR calc Af Amer: 80 mL/min/{1.73_m2} (ref >59)
GFR calc non Af Amer: 69 mL/min/{1.73_m2} (ref >59)
Globulin, Total: 2.7 g/dL (ref 1.5–4.5)
Glucose: 122 mg/dL — ABNORMAL HIGH (ref 65–99)
Potassium: 4.3 mmol/L (ref 3.5–5.2)
Sodium: 142 mmol/L (ref 134–144)
Total Protein: 7.1 g/dL (ref 6.0–8.5)

## 2016-09-19 LAB — SPECIMEN STATUS REPORT

## 2016-10-10 ENCOUNTER — Encounter (HOSPITAL_COMMUNITY): Payer: Medicare Other

## 2016-10-13 ENCOUNTER — Encounter: Payer: Self-pay | Admitting: Pediatrics

## 2016-10-13 ENCOUNTER — Telehealth: Payer: Self-pay | Admitting: Family Medicine

## 2016-10-13 ENCOUNTER — Other Ambulatory Visit: Payer: Self-pay | Admitting: Family Medicine

## 2016-10-13 ENCOUNTER — Ambulatory Visit (INDEPENDENT_AMBULATORY_CARE_PROVIDER_SITE_OTHER): Payer: Medicare Other | Admitting: Pediatrics

## 2016-10-13 VITALS — BP 155/90 | HR 81 | Temp 98.3°F | Ht 64.0 in | Wt 178.4 lb

## 2016-10-13 DIAGNOSIS — I10 Essential (primary) hypertension: Secondary | ICD-10-CM

## 2016-10-13 DIAGNOSIS — M10022 Idiopathic gout, left elbow: Secondary | ICD-10-CM

## 2016-10-13 DIAGNOSIS — K219 Gastro-esophageal reflux disease without esophagitis: Secondary | ICD-10-CM | POA: Diagnosis not present

## 2016-10-13 MED ORDER — PREDNISONE 10 MG PO TABS: 10.0000 mg | ORAL_TABLET | Freq: Every day | ORAL | 0 refills | Status: DC

## 2016-10-13 MED ORDER — OMEPRAZOLE 40 MG PO CPDR: 40.0000 mg | DELAYED_RELEASE_CAPSULE | ORAL | 1 refills | Status: DC

## 2016-10-13 NOTE — Progress Notes (Signed)
  Subjective:   Patient ID: Danielle Parrish, female    DOB: [birth year], 75 y.o.   MRN: [id number] CC: Elbow Pain (Left)  HPI: Danielle Parrish is a 75 y.o. female presenting for Elbow Pain (Left)  Started about a week ago Elbow has been red and hot R foot pain started a few days before elbow Started all of a sudden No injuries H/o gout in her foot Has had had in elbow before but feels similar Foot feeling slightly better now Elbow is no better Not taking anything for the pain  Relevant past medical, surgical, family and social history reviewed. Allergies and medications reviewed and updated. History  Smoking Status  . Never Smoker  Smokeless Tobacco  . Never Used   ROS: Per HPI   Objective:    BP (!) 155/90   Pulse 81   Temp 98.3 F (36.8 C) (Oral)   Ht 5\' 4"  (1.626 m)   Wt 178 lb 6.4 oz (80.9 kg)   BMI 30.62 kg/m   Wt Readings from Last 3 Encounters:  10/13/16 178 lb 6.4 oz (80.9 kg)  09/14/16 176 lb 3.2 oz (79.9 kg)  07/31/16 177 lb 12.8 oz (80.6 kg)    Gen: NAD, alert, cooperative with exam, NCAT EYES: EOMI, no conjunctival injection, or no icterus CV: DP pulses 2+ Resp:normal WOB Neuro: Alert and oriented, strength equal b/l UE and LE, coordination grossly normal MSK: L elbow red, slightly swollen, pain with elbow ext completely. No point tenderness over bony prominences, some swelling L midfoot, generalized tenderness over foot  Assessment & Plan:  Danielle Parrish was seen today for elbow pain.  Diagnoses and all orders for this visit:  Acute idiopathic gout of left elbow -     predniSONE (DELTASONE) 10 MG tablet; Take 1 tablet (10 mg total) by mouth daily with breakfast. Take 3 tabs for 3 days, 2 for 3 days, then 1 tab for 3 days  Gastroesophageal reflux disease without esophagitis -     omeprazole (PRILOSEC) 40 MG capsule; Take 1 capsule (40 mg total) by mouth every morning.   Follow up plan: prn Assunta Found, MD [place]

## 2016-10-13 NOTE — Telephone Encounter (Signed)
appt scheduled for L elbow pain

## 2016-10-27 ENCOUNTER — Encounter: Payer: Self-pay | Admitting: Vascular Surgery

## 2016-10-31 ENCOUNTER — Ambulatory Visit (HOSPITAL_COMMUNITY)
Admission: RE | Admit: 2016-10-31 | Discharge: 2016-10-31 | Disposition: A | Discharge status: Home / Self Care | Payer: Medicare Other | Source: Ambulatory Visit | Attending: Vascular Surgery | Admitting: Vascular Surgery

## 2016-10-31 ENCOUNTER — Ambulatory Visit (INDEPENDENT_AMBULATORY_CARE_PROVIDER_SITE_OTHER): Payer: Medicare Other | Admitting: Vascular Surgery

## 2016-10-31 ENCOUNTER — Encounter: Payer: Self-pay | Admitting: Vascular Surgery

## 2016-10-31 VITALS — BP 146/79 | HR 75 | Resp 20 | Ht 64.0 in | Wt 183.7 lb

## 2016-10-31 DIAGNOSIS — I83892 Varicose veins of left lower extremities with other complications: Secondary | ICD-10-CM

## 2016-10-31 DIAGNOSIS — I83893 Varicose veins of bilateral lower extremities with other complications: Secondary | ICD-10-CM

## 2016-10-31 DIAGNOSIS — R609 Edema, unspecified: Secondary | ICD-10-CM | POA: Diagnosis present

## 2016-10-31 NOTE — Progress Notes (Signed)
Subjective:     Patient ID: Danielle Parrish, female   DOB: 1941/05/15, 75 y.o.   MRN: [id number]  HPI This 75 year old female returns for continued follow-up regarding her varicose veins and swelling left worse than right. Patient states that she continues to have heavily aching burning discomfort in the left calf with swelling as the day progresses. She has tried long-leg elastic compression stockings 20-30 millimeter gradient as well as elevation and ibuprofen with no improvement in her symptoms. She has minimal symptoms in the contralateral right leg. She also has less edema in the right ankle. She has no history of DVT thrombophlebitis stasis ulcers or bleeding. She does have remote history of congestive heart failure. She's had no history of coronary artery disease or myocardial infarction.  Past Medical History:  Diagnosis Date  . Anxiety   . Arthritis   . CHF (congestive heart failure) ([place])   . Chronic pain of multiple joints   . Depression   . Essential hypertension   . GERD (gastroesophageal reflux disease)   . Hyperlipidemia   . Hypothyroidism   . Osteoporosis   . Sleep apnea    CPAP  . Vitamin D deficiency     Social History  Substance Use Topics  . Smoking status: Never Smoker  . Smokeless tobacco: Never Used  . Alcohol use No    Family History  Problem Relation Age of Onset  . Hypertension Mother   . Stroke Mother   . Cancer Father     Allergies  Allergen Reactions  . Crestor [Rosuvastatin] Other (See Comments)    Body aches.   . Nsaids     Burns stomach.   . Penicillins Hives    Has patient had a PCN reaction causing immediate rash, facial/tongue/throat swelling, SOB or lightheadedness with hypotension: Yes Has patient had a PCN reaction causing severe rash involving mucus membranes or skin necrosis: unknown Has patient had a PCN reaction that required hospitalization No Has patient had a PCN reaction occurring within the last 10 years: No If all of the  above answers are "NO", then may proceed with Cephalosporin use.   . Vytorin [Ezetimibe-Simvastatin]     Body aches.      Current Outpatient Prescriptions:  .  amLODipine (NORVASC) 10 MG tablet, TAKE 1 TABLET EVERY DAY, Disp: 90 tablet, Rfl: 0 .  Biotin 10 MG CAPS, Take by mouth., Disp: , Rfl:  .  Cholecalciferol (VITAMIN D-3 PO), Take by mouth., Disp: , Rfl:  .  diclofenac sodium (VOLTAREN) 1 % GEL, Apply 2 g topically 4 (four) times daily., Disp: 100 g, Rfl: 5 .  diphenhydrAMINE (BENADRYL) 25 MG tablet, Take 50 mg by mouth at bedtime., Disp: , Rfl:  .  fenofibrate 160 MG tablet, Take 160 mg by mouth daily., Disp: , Rfl:  .  Ferrous Sulfate 90 (18 FE) MG TABS, Take 1 tablet by mouth every morning., Disp: 90 each, Rfl: 1 .  furosemide (LASIX) 20 MG tablet, Take 1 tablet (20 mg total) by mouth daily., Disp: 30 tablet, Rfl: 5 .  gabapentin (NEURONTIN) 100 MG capsule, TAKE 1 CAPSULE TWICE DAILY, Disp: 180 capsule, Rfl: 0 .  HYDROcodone-acetaminophen (NORCO) 7.5-325 MG tablet, Take 0.5-1 tablets by mouth every 6 (six) hours as needed for moderate pain., Disp: 60 tablet, Rfl: 0 .  levothyroxine (SYNTHROID, LEVOTHROID) 100 MCG tablet, Take 1 tablet (100 mcg total) by mouth daily before breakfast., Disp: 90 tablet, Rfl: 3 .  lovastatin (MEVACOR) 40 MG tablet, Take  1 tablet (40 mg total) by mouth at bedtime., Disp: 90 tablet, Rfl: 3 .  Multiple Vitamin (MULTIVITAMIN) tablet, Take 1 tablet by mouth every morning. , Disp: , Rfl:  .  niacin 500 MG tablet, Take 500 mg by mouth at bedtime., Disp: , Rfl:  .  nystatin cream (MYCOSTATIN), Apply 1 application topically 2 (two) times daily., Disp: 30 g, Rfl: 0 .  omeprazole (PRILOSEC) 40 MG capsule, Take 1 capsule (40 mg total) by mouth every morning., Disp: 90 capsule, Rfl: 1 .  pyridOXINE (VITAMIN B-6) 100 MG tablet, Take 100 mg by mouth daily., Disp: , Rfl:  .  sertraline (ZOLOFT) 100 MG tablet, Take 1 tablet (100 mg total) by mouth every morning., Disp:  90 tablet, Rfl: 3 .  sodium chloride (OCEAN) 0.65 % SOLN nasal spray, Place 1 spray into both nostrils as needed for congestion., Disp: , Rfl:  .  vitamin B-12 (CYANOCOBALAMIN) 100 MCG tablet, Take 100 mcg by mouth daily., Disp: , Rfl:  .  vitamin C (ASCORBIC ACID) 500 MG tablet, Take 500 mg by mouth daily., Disp: , Rfl:  .  vitamin E 400 UNIT capsule, Take 400 Units by mouth daily., Disp: , Rfl:  .  predniSONE (DELTASONE) 10 MG tablet, Take 1 tablet (10 mg total) by mouth daily with breakfast. Take 3 tabs for 3 days, 2 for 3 days, then 1 tab for 3 days (Patient not taking: Reported on 10/31/2016), Disp: 18 tablet, Rfl: 0 .  triamcinolone cream (KENALOG) 0.1 %, Apply 1 application topically 2 (two) times daily. (Patient not taking: Reported on 10/31/2016), Disp: 30 g, Rfl: 1  Vitals:   10/31/16 1351 10/31/16 1354  BP: (!) 144/84 (!) 146/79  Pulse: 75   Resp: 20   SpO2: 93%   Weight: 183 lb 11.2 oz (83.3 kg)   Height: 5\' 4"  (1.626 m)     Body mass index is 31.53 kg/m.         Review of Systems Denies chest pain but does have dyspnea on exertion. Minimal claudication symptoms bilateral calf area left worse than right.    Objective:   Physical Exam BP (!) 146/79 (BP Location: Right Arm) Comment: recheck  Pulse 75   Resp 20   Ht 5\' 4"  (1.626 m)   Wt 183 lb 11.2 oz (83.3 kg)   SpO2 93%   BMI 31.53 kg/m   Gen. well-developed well-nourished female no apparent distress alert and oriented 3 Lungs no rhonchi or wheezing Left leg with bulging varicosities in the posterior calf and medial calf above the malleolus. Also patch of reticular veins and small varicosities in the medial distal thigh over about a 4 cm in diameter area which is tender to touch  Today I ordered a venous duplex exam of both legs which I reviewed and interpreted. There is no DVT. There is gross reflux in the left great saphenous system but not the small saphenous system or deep system. Right leg is free of any  significant reflux.  I performed a bedside sono site exam today confirming that the left great saphenous vein is enlarged with reflux down to the knee level supplying these painful varicosities    Assessment:     painful varicosities left leg with chronic edema not responding to conservative measures including long-leg elastic compression stockings 20-30 millimeter gradient, elevation, and ibuprofen. Symptoms are affecting patient's daily living and she does desire treatment.    Plan:     patient needs #1 laser ablation  left great saphenous vein followed by three-month waiting.. #2 she will return in 3 months for evaluation for possible sclerotherapy and or stab phlebectomy We will proceed with precertification to perform laser ablation in the near future

## 2016-11-01 ENCOUNTER — Other Ambulatory Visit: Payer: Self-pay | Admitting: *Deleted

## 2016-11-01 DIAGNOSIS — I83812 Varicose veins of left lower extremities with pain: Secondary | ICD-10-CM

## 2016-11-02 ENCOUNTER — Other Ambulatory Visit: Payer: Self-pay | Admitting: Family Medicine

## 2016-11-02 DIAGNOSIS — Z1231 Encounter for screening mammogram for malignant neoplasm of breast: Secondary | ICD-10-CM

## 2016-11-21 ENCOUNTER — Ambulatory Visit: Payer: Medicare Other | Admitting: Family Medicine

## 2016-11-23 ENCOUNTER — Ambulatory Visit (INDEPENDENT_AMBULATORY_CARE_PROVIDER_SITE_OTHER): Payer: Medicare Other | Admitting: Family Medicine

## 2016-11-23 ENCOUNTER — Encounter: Payer: Self-pay | Admitting: Family Medicine

## 2016-11-23 VITALS — BP 137/72 | HR 67 | Temp 97.1°F | Ht 64.0 in | Wt 180.6 lb

## 2016-11-23 DIAGNOSIS — I10 Essential (primary) hypertension: Secondary | ICD-10-CM

## 2016-11-23 DIAGNOSIS — M1990 Unspecified osteoarthritis, unspecified site: Secondary | ICD-10-CM

## 2016-11-23 DIAGNOSIS — G8929 Other chronic pain: Secondary | ICD-10-CM

## 2016-11-23 DIAGNOSIS — M2550 Pain in unspecified joint: Secondary | ICD-10-CM | POA: Diagnosis not present

## 2016-11-23 DIAGNOSIS — W19XXXA Unspecified fall, initial encounter: Secondary | ICD-10-CM

## 2016-11-23 DIAGNOSIS — Z0289 Encounter for other administrative examinations: Secondary | ICD-10-CM

## 2016-11-23 MED ORDER — HYDROCODONE-ACETAMINOPHEN 7.5-325 MG PO TABS: 0.5000 | ORAL_TABLET | Freq: Four times a day (QID) | ORAL | 0 refills | Status: DC | PRN

## 2016-11-23 MED ORDER — DICLOFENAC SODIUM 1 % TD GEL: 2.0000 g | Freq: Four times a day (QID) | TRANSDERMAL | 5 refills | Status: DC

## 2016-11-23 NOTE — Patient Instructions (Signed)
Great to see you!  Come back in 3 months for routine follow up  I hope you have a great christmas!

## 2016-11-23 NOTE — Progress Notes (Signed)
   HPI  Patient presents today to follow-up for chronic pain, hypertension, angina discuss a recent fall.  3 weeks ago the patient tripped while at work in the bathroom and landed on the posterior part of her head on concrete floor. She had a large "goose egg" but did not lose consciousness. She states that she did not have any headaches afterwards and the soreness from the area has completely resolved.  Hypertension Good medication compliance No chest pain or dyspnea.  Chronic pain Chronic left knee pain, controlled by Voltaren and Norco. Patient's 60 pills of Norco 10 to last about 6-10 weeks. She takes medications as needed, not scheduled, she takes a half a pill whenever possible, these control her pain is well.   PMH: Smoking status noted ROS: Per HPI  Objective: BP 137/72   Pulse 67   Temp 97.1 F (36.2 C) (Oral)   Ht 5\' 4"  (1.626 m)   Wt 180 lb 9.6 oz (81.9 kg)   BMI 31.00 kg/m  Gen: NAD, alert, cooperative with exam HEENT: NCAT, palpation of the head without tenderness or gross deformity CV: RRR, good S1/S2, no murmur Resp: CTABL, no wheezes, non-labored Ext: No edema, warm Neuro: Alert and oriented, No gross deficits  Assessment and plan:  # Fall Fall from what is described to be tripping on someone else's walker, patient now completely resolved from symptoms. Discussed low threshold for getting checked out for any falls   # Chronic pain syndrome, chronic left knee pain, pain management agreement signs Refilled hydrocodone 2 prescriptions, this should cover her for 3 months.  database reviewed today- no red flags   # Hypertension Well-controlled on Lasix and amlodipine. Continue current medications, labs up-to-date   Meds ordered this encounter  Medications  . HYDROcodone-acetaminophen (NORCO) 7.5-325 MG tablet    Sig: Take 0.5-1 tablets by mouth every 6 (six) hours as needed for moderate pain.    Dispense:  60 tablet    Refill:  0    Please do not  fill until 30 days after the previous fill.  Marland Kitchen HYDROcodone-acetaminophen (NORCO) 7.5-325 MG tablet    Sig: Take 0.5-1 tablets by mouth every 6 (six) hours as needed for moderate pain.    Dispense:  60 tablet    Refill:  0    Please do not fill until at least 30 days after date written  . diclofenac sodium (VOLTAREN) 1 % GEL    Sig: Apply 2 g topically 4 (four) times daily.    Dispense:  100 g    Refill:  [place], MD [place] 11/23/2016, 9:40 AM

## 2016-11-29 ENCOUNTER — Encounter: Payer: Self-pay | Admitting: Vascular Surgery

## 2016-11-30 ENCOUNTER — Ambulatory Visit
Admission: RE | Admit: 2016-11-30 | Discharge: 2016-11-30 | Disposition: A | Discharge status: Home / Self Care | Payer: Medicare Other | Source: Ambulatory Visit | Attending: Family Medicine | Admitting: Family Medicine

## 2016-11-30 DIAGNOSIS — Z1231 Encounter for screening mammogram for malignant neoplasm of breast: Secondary | ICD-10-CM | POA: Diagnosis not present

## 2016-12-05 ENCOUNTER — Encounter: Payer: Self-pay | Admitting: Vascular Surgery

## 2016-12-05 ENCOUNTER — Ambulatory Visit (INDEPENDENT_AMBULATORY_CARE_PROVIDER_SITE_OTHER): Payer: Medicare Other | Admitting: Vascular Surgery

## 2016-12-05 VITALS — BP 154/77 | HR 71 | Temp 98.0°F | Resp 16 | Ht 63.0 in | Wt 180.0 lb

## 2016-12-05 DIAGNOSIS — I83892 Varicose veins of left lower extremities with other complications: Secondary | ICD-10-CM

## 2016-12-05 NOTE — Progress Notes (Signed)
Subjective:     Patient ID: Danielle Parrish, female   DOB: 12-May-1941, 75 y.o.   MRN: [id number]  HPI 75 year old female had laser ablation left great saphenous vein from the proximal calf to near the saphenofemoral junction performed under local tumescent anesthesia. A total of approximately 1800 J of energy was utilized. She tolerated the procedure well.  Review of Systems     Objective:   Physical Exam BP (!) 154/77   Pulse 71   Temp 98 F (36.7 C)   Resp 16   Ht 5\' 3"  (1.6 m)   Wt 180 lb (81.6 kg)   SpO2 98%   BMI 31.89 kg/m        Assessment:     Well-tolerated laser ablation left great saphenous vein performed under local tumescent anesthesia    Plan:     Return in 1 week for venous duplex exam to confirm closure left great saphenous vein

## 2016-12-05 NOTE — Progress Notes (Signed)
Laser Ablation Procedure    Date: 12/05/2016   Danielle Parrish [place] DOB:02-Jan-1941  Consent signed: Yes    Surgeon:  Dr. Nelda Severe. Kellie Simmering  Procedure: Laser Ablation: left Greater Saphenous Vein  BP (!) 154/77   Pulse 71   Temp 98 F (36.7 C)   Resp 16   Ht 5\' 3"  (1.6 m)   Wt 180 lb (81.6 kg)   SpO2 98%   BMI 31.89 kg/m   Tumescent Anesthesia: 250 cc 0.9% NaCl with 50 cc Lidocaine HCL with 1% Epi and 15 cc 8.4% NaHCO3  Local Anesthesia: 3 cc Lidocaine HCL and NaHCO3 (ratio 2:1)  Pulsed Mode: 15 watts, 537ms delay, 1.0 duration  Total Energy: 1856             Total Pulses: 124               Total Time: 2:04    Patient tolerated procedure well  Notes:   Description of Procedure:  After marking the course of the secondary varicosities, the patient was placed on the operating table in the supine position, and the left leg was prepped and draped in sterile fashion.   Local anesthetic was administered and under ultrasound guidance the saphenous vein was accessed with a micro needle and guide wire; then the mirco puncture sheath was placed.  A guide wire was inserted saphenofemoral junction , followed by a 5 french sheath.  The position of the sheath and then the laser fiber below the junction was confirmed using the ultrasound.  Tumescent anesthesia was administered along the course of the saphenous vein using ultrasound guidance. The patient was placed in Trendelenburg position and protective laser glasses were placed on patient and staff, and the laser was fired at 15 watts continuous mode advancing 1-56mm/second for a total of 1856 joules.     Steri strips were applied to the stab wounds and ABD pads and thigh high compression stockings were applied.  Ace wrap bandages were applied over the phlebectomy sites and at the top of the saphenofemoral junction. Blood loss was less than 15 cc.  The patient ambulated out of the operating room having tolerated the procedure well.

## 2016-12-12 ENCOUNTER — Ambulatory Visit (INDEPENDENT_AMBULATORY_CARE_PROVIDER_SITE_OTHER): Payer: Medicare Other | Admitting: Vascular Surgery

## 2016-12-12 ENCOUNTER — Ambulatory Visit (HOSPITAL_COMMUNITY)
Admission: RE | Admit: 2016-12-12 | Discharge: 2016-12-12 | Disposition: A | Discharge status: Home / Self Care | Payer: Medicare Other | Source: Ambulatory Visit | Attending: Vascular Surgery | Admitting: Vascular Surgery

## 2016-12-12 ENCOUNTER — Encounter: Payer: Self-pay | Admitting: Vascular Surgery

## 2016-12-12 VITALS — BP 145/80 | HR 66 | Temp 97.8°F | Resp 14 | Ht 64.0 in | Wt 180.0 lb

## 2016-12-12 DIAGNOSIS — I83892 Varicose veins of left lower extremities with other complications: Secondary | ICD-10-CM | POA: Insufficient documentation

## 2016-12-12 DIAGNOSIS — I83812 Varicose veins of left lower extremities with pain: Secondary | ICD-10-CM | POA: Diagnosis not present

## 2016-12-12 DIAGNOSIS — Z9889 Other specified postprocedural states: Secondary | ICD-10-CM | POA: Insufficient documentation

## 2016-12-12 NOTE — Progress Notes (Signed)
Subjective:     Patient ID: Danielle Parrish, female   DOB: 1941-11-11, 75 y.o.   MRN: [id number]  HPI This 75 year old female returns 1 week post-laser ablation left great saphenous vein from distal thigh to near the saphenofemoral junction for gross reflux with painful varicosities and swelling. She has had mild to moderate discomfort along the course of the great saphenous vein which is improving. She did take her ibuprofen and where her elastic compression stockings as instructed. She has had some mild edema distally as she was experiencing previously.  Past Medical History:  Diagnosis Date  . Anxiety   . Arthritis   . CHF (congestive heart failure) ([place])   . Chronic pain of multiple joints   . Depression   . Essential hypertension   . GERD (gastroesophageal reflux disease)   . Hyperlipidemia   . Hypothyroidism   . Osteoporosis   . Sleep apnea    CPAP  . Vitamin D deficiency     Social History  Substance Use Topics  . Smoking status: Never Smoker  . Smokeless tobacco: Never Used  . Alcohol use No    Family History  Problem Relation Age of Onset  . Hypertension Mother   . Stroke Mother   . Cancer Father     Allergies  Allergen Reactions  . Crestor [Rosuvastatin] Other (See Comments)    Body aches.   . Nsaids     Burns stomach.   . Penicillins Hives    Has patient had a PCN reaction causing immediate rash, facial/tongue/throat swelling, SOB or lightheadedness with hypotension: Yes Has patient had a PCN reaction causing severe rash involving mucus membranes or skin necrosis: unknown Has patient had a PCN reaction that required hospitalization No Has patient had a PCN reaction occurring within the last 10 years: No If all of the above answers are "NO", then may proceed with Cephalosporin use.   . Vytorin [Ezetimibe-Simvastatin]     Body aches.      Current Outpatient Prescriptions:  .  amLODipine (NORVASC) 10 MG tablet, TAKE 1 TABLET EVERY DAY, Disp: 90 tablet,  Rfl: 0 .  Biotin 10 MG CAPS, Take by mouth., Disp: , Rfl:  .  Cholecalciferol (VITAMIN D-3 PO), Take by mouth., Disp: , Rfl:  .  diclofenac sodium (VOLTAREN) 1 % GEL, Apply 2 g topically 4 (four) times daily., Disp: 100 g, Rfl: 5 .  diphenhydrAMINE (BENADRYL) 25 MG tablet, Take 50 mg by mouth at bedtime., Disp: , Rfl:  .  fenofibrate 160 MG tablet, Take 160 mg by mouth daily., Disp: , Rfl:  .  Ferrous Sulfate 90 (18 FE) MG TABS, Take 1 tablet by mouth every morning., Disp: 90 each, Rfl: 1 .  furosemide (LASIX) 20 MG tablet, Take 1 tablet (20 mg total) by mouth daily., Disp: 30 tablet, Rfl: 5 .  gabapentin (NEURONTIN) 100 MG capsule, TAKE 1 CAPSULE TWICE DAILY, Disp: 180 capsule, Rfl: 0 .  HYDROcodone-acetaminophen (NORCO) 7.5-325 MG tablet, Take 0.5-1 tablets by mouth every 6 (six) hours as needed for moderate pain., Disp: 60 tablet, Rfl: 0 .  HYDROcodone-acetaminophen (NORCO) 7.5-325 MG tablet, Take 0.5-1 tablets by mouth every 6 (six) hours as needed for moderate pain., Disp: 60 tablet, Rfl: 0 .  levothyroxine (SYNTHROID, LEVOTHROID) 100 MCG tablet, Take 1 tablet (100 mcg total) by mouth daily before breakfast., Disp: 90 tablet, Rfl: 3 .  lovastatin (MEVACOR) 40 MG tablet, Take 1 tablet (40 mg total) by mouth at bedtime., Disp: 90 tablet,  Rfl: 3 .  Multiple Vitamin (MULTIVITAMIN) tablet, Take 1 tablet by mouth every morning. , Disp: , Rfl:  .  niacin 500 MG tablet, Take 500 mg by mouth at bedtime., Disp: , Rfl:  .  nystatin cream (MYCOSTATIN), Apply 1 application topically 2 (two) times daily., Disp: 30 g, Rfl: 0 .  omeprazole (PRILOSEC) 40 MG capsule, Take 1 capsule (40 mg total) by mouth every morning., Disp: 90 capsule, Rfl: 1 .  predniSONE (DELTASONE) 10 MG tablet, , Disp: , Rfl:  .  pyridOXINE (VITAMIN B-6) 100 MG tablet, Take 100 mg by mouth daily., Disp: , Rfl:  .  sertraline (ZOLOFT) 100 MG tablet, Take 1 tablet (100 mg total) by mouth every morning., Disp: 90 tablet, Rfl: 3 .  sodium  chloride (OCEAN) 0.65 % SOLN nasal spray, Place 1 spray into both nostrils as needed for congestion., Disp: , Rfl:  .  vitamin B-12 (CYANOCOBALAMIN) 100 MCG tablet, Take 100 mcg by mouth daily., Disp: , Rfl:  .  vitamin C (ASCORBIC ACID) 500 MG tablet, Take 500 mg by mouth daily., Disp: , Rfl:  .  vitamin E 400 UNIT capsule, Take 400 Units by mouth daily., Disp: , Rfl:   Vitals:   12/12/16 1340 12/12/16 1343  BP: (!) 153/78 (!) 145/80  Pulse: 64 66  Resp: 14   Temp: 97.8 F (36.6 C)   SpO2: 92%   Weight: 180 lb (81.6 kg)   Height: 5\' 4"  (1.626 m)     Body mass index is 30.9 kg/m.        Review of Systems Positive for GERD. Denies chest pain, dyspnea on exertion, PND, orthopnea, hemoptysis    Objective:   Physical Exam BP (!) 145/80 (BP Location: Left Arm, Patient Position: Sitting, Cuff Size: Large)   Pulse 66   Temp 97.8 F (36.6 C)   Resp 14   Ht 5\' 4"  (1.626 m)   Wt 180 lb (81.6 kg)   SpO2 92%   BMI 30.90 kg/m   Gen. well-developed well-nourished female no apparent distress alert and oriented 3 Lungs no rhonchi or wheezing Cardiovascular regular rhythm no murmurs Left leg with mild to moderate discomfort upon deep palpation of the great saphenous vein from thefi to the inguinal crease. Vein is palpable. 1+ distal edema. Bulging varicosities and medial calf and posterior calf noted. 3+ dorsalis pedis pulse palpable.  Today I ordered a venous duplex exam the left leg which I reviewed and interpreted. There is no DVT. There is total closure of the great saphenous vein from the distal thigh to within 3 cm of the saphenofemoral junction(     Assessment:     Successful laser ablation left great saphenous vein for gross reflux with painful varicosities    Plan:     Return in 3 months for further evaluation to determine if this stab phlebectomy and/or sclerotherapy will be indicated

## 2016-12-12 NOTE — Addendum Note (Signed)
Addended by: Lianne Cure A on: 12/12/2016 09:22 AM   Modules accepted: Orders

## 2017-02-06 ENCOUNTER — Other Ambulatory Visit: Payer: Self-pay | Admitting: Family Medicine

## 2017-02-13 ENCOUNTER — Ambulatory Visit: Payer: Medicare Other | Admitting: Family Medicine

## 2017-02-14 ENCOUNTER — Encounter: Payer: Self-pay | Admitting: Family Medicine

## 2017-02-16 ENCOUNTER — Ambulatory Visit (INDEPENDENT_AMBULATORY_CARE_PROVIDER_SITE_OTHER): Payer: Medicare Other

## 2017-02-16 ENCOUNTER — Ambulatory Visit (INDEPENDENT_AMBULATORY_CARE_PROVIDER_SITE_OTHER): Payer: Medicare Other | Admitting: Family Medicine

## 2017-02-16 ENCOUNTER — Encounter: Payer: Self-pay | Admitting: Family Medicine

## 2017-02-16 VITALS — BP 149/74 | HR 60 | Temp 97.6°F | Ht 64.0 in | Wt 179.4 lb

## 2017-02-16 DIAGNOSIS — M1990 Unspecified osteoarthritis, unspecified site: Secondary | ICD-10-CM | POA: Diagnosis not present

## 2017-02-16 DIAGNOSIS — I10 Essential (primary) hypertension: Secondary | ICD-10-CM

## 2017-02-16 DIAGNOSIS — M25561 Pain in right knee: Secondary | ICD-10-CM

## 2017-02-16 DIAGNOSIS — Z0289 Encounter for other administrative examinations: Secondary | ICD-10-CM

## 2017-02-16 MED ORDER — AMLODIPINE BESYLATE 10 MG PO TABS
10.0000 mg | ORAL_TABLET | Freq: Every day | ORAL | 3 refills | Status: DC
Start: 2017-02-16 — End: 2017-02-16

## 2017-02-16 MED ORDER — AMLODIPINE BESYLATE 10 MG PO TABS: 10.0000 mg | ORAL_TABLET | Freq: Every day | ORAL | 0 refills | Status: DC

## 2017-02-16 MED ORDER — HYDROCODONE-ACETAMINOPHEN 7.5-325 MG PO TABS: 0.5000 | ORAL_TABLET | Freq: Four times a day (QID) | ORAL | 0 refills | Status: DC | PRN

## 2017-02-16 MED ORDER — AMLODIPINE BESYLATE 10 MG PO TABS: 10.0000 mg | ORAL_TABLET | Freq: Every day | ORAL | 3 refills | Status: DC

## 2017-02-16 NOTE — Patient Instructions (Signed)
Great to see you!  Come back in 4 months unless you need [place] sooner.   We can refer to orthopedics if you would like for your knee

## 2017-02-16 NOTE — Progress Notes (Signed)
HPI  Patient presents today here to discuss right knee pain.  Patient has chronic left knee pain due to arthritis which is treated with hydrocodone intermittently, she has done well with this recently and reduce her hydrocodone dose quite a bit. She's needed more medication recently due to right knee pain.  Right knee pain started about one week ago, described as medial distal right knee pain No injury. No instability or locking symptoms.  Patient has hypertension as well, blood pressure medication has been missed for about one week due to running out of amlodipine.  PMH: Smoking status noted ROS: Per HPI  Objective: BP (!) 149/74   Pulse 60   Temp 97.6 F (36.4 C) (Oral)   Ht 5\' 4"  (1.626 m)   Wt 179 lb 6.4 oz (81.4 kg)   BMI 30.79 kg/m  Gen: NAD, alert, cooperative with exam HEENT: NCAT CV: RRR, good [id number], 2/6 systolic murmur loudest at RSB 2nd-3rd  IC space Resp: CTABL, no wheezes, non-labored Ext: No edema, warm Neuro: Alert and oriented, No gross deficits  MSK: R knee without erythema, effusion, bruising, or gross deformity No joint line tenderness.  ligamentously intact to Lachman's and with varus and valgus stress.  Negative McMurray's test  Slight bluish discoloration of the medial proximal ankle area, no warmth and no significant swelling.   Plain film shows mild OA changes on the right knee, medial compartment OA on the left  Assessment and plan:  # Right knee pain Clinically osteoarthritis, patient is getting good benefit from the medication she is taking for her left knee Offered orthopedics, she will consider  # Left knee arthritis, arthritis, pain medication agreement Stable Refill hydrocodone, I confirmed that she has one refill still on file at her pharmacy, given 1 additional refill to cover 3 months prescription.   # Hypertension Elevated today, likely due to being out of amlodipine, 2 week supply sent to local pharmacy, one year supply sent  to mail in pharmacy Continue Lasix, has diastolic dysfunction with no signs of volume overload    Orders Placed This Encounter  Procedures  . DG Knee 1-2 Views Right    Standing Status:   Future    Number of Occurrences:   1    Standing Expiration Date:   04/18/2018    Order Specific Question:   Reason for Exam (SYMPTOM  OR DIAGNOSIS REQUIRED)    Answer:   R knee pain,    Order Specific Question:   Preferred imaging location?    Answer:   Internal    Meds ordered this encounter  Medications  . DISCONTD: amLODipine (NORVASC) 10 MG tablet    Sig: Take 1 tablet (10 mg total) by mouth daily.    Dispense:  90 tablet    Refill:  3  . HYDROcodone-acetaminophen (NORCO) 7.5-325 MG tablet    Sig: Take 0.5-1 tablets by mouth every 6 (six) hours as needed for moderate pain.    Dispense:  60 tablet    Refill:  0    Please do not fill until 03/17/2017 or after  . DISCONTD: amLODipine (NORVASC) 10 MG tablet    Sig: Take 1 tablet (10 mg total) by mouth daily.    Dispense:  14 tablet    Refill:  0    D/C first rx  . amLODipine (NORVASC) 10 MG tablet    Sig: Take 1 tablet (10 mg total) by mouth daily.    Dispense:  90 tablet    Refill:  3    D/C first rx    Laroy Apple, MD [facility] 02/16/2017, 10:37 AM

## 2017-03-02 ENCOUNTER — Encounter: Payer: Self-pay | Admitting: Vascular Surgery

## 2017-03-13 ENCOUNTER — Ambulatory Visit: Payer: Medicare Other | Admitting: Vascular Surgery

## 2017-03-22 ENCOUNTER — Ambulatory Visit: Payer: Medicare Other

## 2017-03-31 ENCOUNTER — Encounter: Payer: Self-pay | Admitting: Pediatrics

## 2017-03-31 ENCOUNTER — Ambulatory Visit (INDEPENDENT_AMBULATORY_CARE_PROVIDER_SITE_OTHER): Payer: Medicare Other | Admitting: Pediatrics

## 2017-03-31 VITALS — BP 135/67 | HR 65 | Temp 97.6°F | Ht 64.0 in | Wt 182.4 lb

## 2017-03-31 DIAGNOSIS — G8929 Other chronic pain: Secondary | ICD-10-CM | POA: Diagnosis not present

## 2017-03-31 DIAGNOSIS — M25561 Pain in right knee: Secondary | ICD-10-CM

## 2017-03-31 MED ORDER — DICLOFENAC SODIUM 1 % TD GEL: 2.0000 g | Freq: Four times a day (QID) | TRANSDERMAL | 5 refills | Status: DC

## 2017-03-31 NOTE — Progress Notes (Signed)
  Subjective:   Patient ID: Danielle Parrish, female    DOB: Oct 25, 1941, 76 y.o.   MRN: [id number] CC: Knee Pain (right)   HPI: Danielle Parrish is a 76 y.o. female presenting for Knee Pain (right)   Ongoing for about a week No known injury Hurts medial knee Has had L knee pain for a while, R knee pain more recent over past few months Was seen 6 weeks ago for similar symptoms Pt says knee pain improved, then got worse Has been using voltaren gel, helps take the edge off Not able to take NSAIDs Does use vicodin some for pain, takes as prescribed Recent xray of R knee with OA  Relevant past medical, surgical, family and social history reviewed. Allergies and medications reviewed and updated. History  Smoking Status  . Never Smoker  Smokeless Tobacco  . Never Used   ROS: Per HPI   Objective:    BP 135/67   Pulse 65   Temp 97.6 F (36.4 C) (Oral)   Ht 5\' 4"  (1.626 m)   Wt 182 lb 6.4 oz (82.7 kg)   BMI 31.31 kg/m   Wt Readings from Last 3 Encounters:  03/31/17 182 lb 6.4 oz (82.7 kg)  02/16/17 179 lb 6.4 oz (81.4 kg)  12/12/16 180 lb (81.6 kg)    Gen: NAD, alert, cooperative with exam, NCAT EYES: EOMI, no conjunctival injection, or no icterus CV: NRRR, normal S1/S2, no murmur, distal pulses 2+ b/l Resp: CTABL, no wheezes, normal WOB Ext: No edema, warm Neuro: Alert and oriented, strength equal b/l UE and LE, coordination grossly normal MSK: tender over pes anserine bursa R knee No redness No joint line tenderness No effusion Skin: blanching slightly red rash over anterior ankles, non-tender, not warm  Assessment & Plan:  Danielle Parrish was seen today for knee pain.  Diagnoses and all orders for this visit:  Chronic pain of right knee Ice, rest, topical NSAIDs -     diclofenac sodium (VOLTAREN) 1 % GEL; Apply 2 g topically 4 (four) times daily. -     Ambulatory referral to Orthopedic Surgery  Follow up plan: 4 weeks Assunta Found, MD [place]

## 2017-04-02 ENCOUNTER — Telehealth: Payer: Self-pay | Admitting: Pediatrics

## 2017-04-02 ENCOUNTER — Telehealth: Payer: Self-pay | Admitting: Family Medicine

## 2017-04-02 DIAGNOSIS — G8929 Other chronic pain: Secondary | ICD-10-CM

## 2017-04-02 DIAGNOSIS — M25561 Pain in right knee: Principal | ICD-10-CM

## 2017-04-02 NOTE — Telephone Encounter (Signed)
That will be fine but let dr. Evette Doffing order and get results

## 2017-04-02 NOTE — Telephone Encounter (Signed)
Patient was seen on 04/07 by Dr. Evette Doffing for knee pain and is wanting to come in for just a Xray.  Last Xray showed Degenerative changes

## 2017-04-03 ENCOUNTER — Ambulatory Visit (INDEPENDENT_AMBULATORY_CARE_PROVIDER_SITE_OTHER): Payer: Medicare Other

## 2017-04-03 ENCOUNTER — Other Ambulatory Visit (INDEPENDENT_AMBULATORY_CARE_PROVIDER_SITE_OTHER): Payer: Medicare Other

## 2017-04-03 DIAGNOSIS — G8929 Other chronic pain: Secondary | ICD-10-CM | POA: Diagnosis not present

## 2017-04-03 DIAGNOSIS — M25561 Pain in right knee: Secondary | ICD-10-CM | POA: Diagnosis not present

## 2017-04-03 NOTE — Addendum Note (Signed)
Addended by: Antonietta Barcelona D on: 04/03/2017 08:20 AM   Modules accepted: Orders

## 2017-04-03 NOTE — Telephone Encounter (Signed)
Pt aware she can come for xray Order placed under Dr. Evette Doffing.

## 2017-04-03 NOTE — Telephone Encounter (Signed)
Closing encounter addressed in another TC encounter

## 2017-04-05 DIAGNOSIS — M7051 Other bursitis of knee, right knee: Secondary | ICD-10-CM | POA: Diagnosis not present

## 2017-04-05 DIAGNOSIS — M25561 Pain in right knee: Secondary | ICD-10-CM | POA: Diagnosis not present

## 2017-04-13 ENCOUNTER — Telehealth: Payer: Self-pay | Admitting: Family Medicine

## 2017-04-13 MED ORDER — GABAPENTIN 100 MG PO CAPS: 100.0000 mg | ORAL_CAPSULE | Freq: Two times a day (BID) | ORAL | 1 refills | Status: DC

## 2017-04-13 NOTE — Telephone Encounter (Signed)
What is the name of the medication? Gabapentin pt is out  Have you contacted your pharmacy to request a refill? No because its mail order, and needs a refill sent to [facility] would you like this sent to? needs a refill sent to [place]   Patient notified that their request is being sent to the clinical staff for review and that they should receive a call once it is complete. If they do not receive a call within 24 hours they can check with their pharmacy or our office.

## 2017-04-13 NOTE — Telephone Encounter (Signed)
Rx sent in to mail in [place], MD [facility] 04/13/2017, 5:10 PM

## 2017-04-13 NOTE — Telephone Encounter (Signed)
Error

## 2017-04-13 NOTE — Addendum Note (Signed)
Addended by: Wardell Heath on: 04/13/2017 06:13 PM   Modules accepted: Orders

## 2017-04-13 NOTE — Telephone Encounter (Signed)
Per patient request gabapentin was resent to [facility]

## 2017-04-16 ENCOUNTER — Encounter: Payer: Self-pay | Admitting: Family Medicine

## 2017-04-16 ENCOUNTER — Ambulatory Visit (INDEPENDENT_AMBULATORY_CARE_PROVIDER_SITE_OTHER): Payer: Medicare Other | Admitting: Family Medicine

## 2017-04-16 VITALS — BP 145/78 | HR 71 | Temp 97.0°F | Ht 64.0 in | Wt 178.2 lb

## 2017-04-16 DIAGNOSIS — G8929 Other chronic pain: Secondary | ICD-10-CM | POA: Diagnosis not present

## 2017-04-16 DIAGNOSIS — M7050 Other bursitis of knee, unspecified knee: Secondary | ICD-10-CM | POA: Diagnosis not present

## 2017-04-16 DIAGNOSIS — M25561 Pain in right knee: Secondary | ICD-10-CM | POA: Diagnosis not present

## 2017-04-16 MED ORDER — PREDNISONE 20 MG PO TABS: ORAL_TABLET | ORAL | 0 refills | Status: DC

## 2017-04-16 NOTE — Progress Notes (Signed)
BP (!) 145/78   Pulse 71   Temp 97 F (36.1 C) (Oral)   Ht 5\' 4"  (1.626 m)   Wt 178 lb 3.2 oz (80.8 kg)   BMI 30.59 kg/m    Subjective:    Patient ID: Danielle Parrish, female    DOB: [birth year], 76 y.o.   MRN: [id number]  HPI: Danielle Parrish is a 76 y.o. female presenting on 04/16/2017 for Knee Pain (right, one spot on inner side below knee cap. has seen Dr. Evette Doffing & Dr. Gladstone Lighter, pt suspects gout, she has had in past)   HPI Right medial knee pain Patient has been having right medial knee pain that has been bothering her for the past few weeks. She has seen Dr. Evette Doffing had a knee injection and then saw Dr. Gladstone Lighter and had an injection of the bursa, the injection of the bursa didn't help but the other did not. She did get some relief but it is coming back. She denies any fevers or chills or redness or warmth. She is concerned about whether or not could be gout wants to be tested for that. She does have some swelling in the knee and is able to ambulate but is using a cane when she doesn't normally use a cane. She denies any giving way or popping or catching.  Relevant past medical, surgical, family and social history reviewed and updated as indicated. Interim medical history since our last visit reviewed. Allergies and medications reviewed and updated.  Review of Systems  Constitutional: Negative for chills and fever.  HENT: Negative for congestion, ear discharge and ear pain.   Eyes: Negative for redness and visual disturbance.  Respiratory: Negative for chest tightness and shortness of breath.   Cardiovascular: Negative for chest pain and leg swelling.  Genitourinary: Negative for difficulty urinating and dysuria.  Musculoskeletal: Positive for arthralgias and myalgias. Negative for back pain and gait problem.  Skin: Negative for rash.  Neurological: Negative for light-headedness and headaches.  Psychiatric/Behavioral: Negative for agitation and behavioral problems.  All other  systems reviewed and are negative.   Per HPI unless specifically indicated above        Objective:    BP (!) 145/78   Pulse 71   Temp 97 F (36.1 C) (Oral)   Ht 5\' 4"  (1.626 m)   Wt 178 lb 3.2 oz (80.8 kg)   BMI 30.59 kg/m   Wt Readings from Last 3 Encounters:  04/16/17 178 lb 3.2 oz (80.8 kg)  03/31/17 182 lb 6.4 oz (82.7 kg)  02/16/17 179 lb 6.4 oz (81.4 kg)    Physical Exam  Constitutional: She is oriented to person, place, and time. She appears well-developed and well-nourished. No distress.  Eyes: Conjunctivae are normal.  Musculoskeletal: Normal range of motion. She exhibits no edema.       Right knee: She exhibits normal range of motion, no swelling, no effusion, normal alignment, no LCL laxity, normal patellar mobility, normal meniscus and no MCL laxity. Tenderness found.       Legs: Neurological: She is alert and oriented to person, place, and time. Coordination normal.  Skin: Skin is warm and dry. No rash noted. She is not diaphoretic.  Psychiatric: She has a normal mood and affect. Her behavior is normal.  Nursing note and vitals reviewed.      Assessment & Plan:   Problem List Items Addressed This Visit    None    Visit Diagnoses    Pes anserine bursitis    -  Primary   Relevant Medications   predniSONE (DELTASONE) 20 MG tablet   Other Relevant Orders   CBC with Differential/Platelet   Uric acid   Chronic pain of right knee       Relevant Orders   CBC with Differential/Platelet   Uric acid       Follow up plan: Return if symptoms worsen or fail to improve.  Counseling provided for all of the vaccine components Orders Placed This Encounter  Procedures  . CBC with Differential/Platelet  . Uric acid    Caryl Pina, MD [facility] 04/16/2017, 6:27 PM

## 2017-04-18 LAB — CBC WITH DIFFERENTIAL/PLATELET
Basophils Absolute: 0 10*3/uL (ref 0.0–0.2)
Basos: 0 %
EOS (ABSOLUTE): 0 10*3/uL (ref 0.0–0.4)
Eos: 0 %
Hematocrit: 46.1 % (ref 34.0–46.6)
Hemoglobin: 15.2 g/dL (ref 11.1–15.9)
Immature Grans (Abs): 0 10*3/uL (ref 0.0–0.1)
Immature Granulocytes: 0 %
Lymphocytes Absolute: 3.9 10*3/uL — ABNORMAL HIGH (ref 0.7–3.1)
Lymphs: 34 %
MCH: 30.8 pg (ref 26.6–33.0)
MCHC: 33 g/dL (ref 31.5–35.7)
MCV: 94 fL (ref 79–97)
Monocytes Absolute: 0.7 10*3/uL (ref 0.1–0.9)
Monocytes: 6 %
Neutrophils Absolute: 6.8 10*3/uL (ref 1.4–7.0)
Neutrophils: 60 %
Platelets: 262 10*3/uL (ref 150–379)
RBC: 4.93 x10E6/uL (ref 3.77–5.28)
RDW: 13.3 % (ref 12.3–15.4)
WBC: 11.4 10*3/uL — ABNORMAL HIGH (ref 3.4–10.8)

## 2017-04-18 LAB — URIC ACID: Uric Acid: 3.9 mg/dL (ref 2.5–7.1)

## 2017-05-24 ENCOUNTER — Encounter: Payer: Self-pay | Admitting: Family Medicine

## 2017-05-24 ENCOUNTER — Ambulatory Visit (INDEPENDENT_AMBULATORY_CARE_PROVIDER_SITE_OTHER): Payer: Medicare Other | Admitting: Family Medicine

## 2017-05-24 VITALS — BP 128/82 | HR 86 | Temp 98.3°F | Ht 64.0 in | Wt 181.4 lb

## 2017-05-24 DIAGNOSIS — E785 Hyperlipidemia, unspecified: Secondary | ICD-10-CM | POA: Diagnosis not present

## 2017-05-24 DIAGNOSIS — B373 Candidiasis of vulva and vagina: Secondary | ICD-10-CM

## 2017-05-24 DIAGNOSIS — Z0289 Encounter for other administrative examinations: Secondary | ICD-10-CM | POA: Diagnosis not present

## 2017-05-24 DIAGNOSIS — K219 Gastro-esophageal reflux disease without esophagitis: Secondary | ICD-10-CM | POA: Diagnosis not present

## 2017-05-24 DIAGNOSIS — F39 Unspecified mood [affective] disorder: Secondary | ICD-10-CM

## 2017-05-24 DIAGNOSIS — B3731 Acute candidiasis of vulva and vagina: Secondary | ICD-10-CM

## 2017-05-24 DIAGNOSIS — R21 Rash and other nonspecific skin eruption: Secondary | ICD-10-CM

## 2017-05-24 MED ORDER — SERTRALINE HCL 100 MG PO TABS
100.0000 mg | ORAL_TABLET | ORAL | 3 refills | Status: DC
Start: 2017-05-24 — End: 2018-02-21

## 2017-05-24 MED ORDER — SALINE SPRAY 0.65 % NA SOLN: 1.0000 | NASAL | 99 refills | Status: DC | PRN

## 2017-05-24 MED ORDER — FLUCONAZOLE 150 MG PO TABS: ORAL_TABLET | ORAL | 0 refills | Status: DC

## 2017-05-24 MED ORDER — OMEPRAZOLE 40 MG PO CPDR: 40.0000 mg | DELAYED_RELEASE_CAPSULE | ORAL | 1 refills | Status: DC

## 2017-05-24 MED ORDER — LOVASTATIN 40 MG PO TABS: 40.0000 mg | ORAL_TABLET | Freq: Every day | ORAL | 3 refills | Status: DC

## 2017-05-24 NOTE — Progress Notes (Signed)
HPI  Patient presents today to follow-up for chronic medical conditions as well as knee pain. Patient with chronic knee pain, this is new and more severe. Describes anterior medial knee pain, she has been seen by [place] on 2 occasions and by orthopedic surgery without improvement of pain. Patient does admit that pain has improved somewhat Ibuprofen 600 mg helps very effectively, however she understands this is dangerous for her at her age. Tramadol did not help, hydrocodone helping.  Patient has a pain contract with [place], she did receive tramadol from her orthopedic surgeon, we have discussed this and she understands not to do this again, she states that tramadol is not effective and did not realize that it was a controlled substance.  Rash Right lower leg rash that seems to be worsening over the last few days. This began about 2 weeks ago. She has long-term irritation of the tongue and eyes, she wonders if these are associated.  Patient states that she has a yeast infection after taking clindamycin from the dentist about 6 weeks ago.  Statins cause muscle aches for her. Patient taking niacin and fenofibrate. Not currently taking lovastatin. Lovastatin is much better than Crestor.  She needs a refill of Zoloft  PMH: Smoking status noted ROS: Per HPI  Objective: BP 128/82   Pulse 86   Temp 98.3 F (36.8 C) (Oral)   Ht _0  (1.626 m)   Wt 181 lb 6.4 oz (82.3 kg)   BMI 31.14 kg/m  Gen: NAD, alert, cooperative with exam HEENT: NCAT, EOMI, PERRL CV: RRR, good S1/S2, no murmur Resp: CTABL, no wheezes, non-labored Ext: No edema, warm Neuro: Alert and oriented, No gross deficits  Skin Right medial leg with petechial rash coalescing in several areas as shown below, also spreading up to the right proximal lateral leg     Assessment and plan:  # Rash "unclear etiology, petechial in appearance Labs including sedimentation rate and CBC. Does not appear to be infectious  # Pain  medication agreement signed, multiple joint pain Patient with right knee pain today Discussed very clearly with her that if she fills a controlled substance again without notifying us that we will no longer be able to prescribe controlled substances. In this case I believe that she was not trying to decieve [place].    # Hyperlipidemia Restart statin, discontinue 5 right and niacin Consider vascepa* Repeat labs in 2-3 months  # Yeast vaginitis Diflucan  GERD- refill PPI, stable Mood disorder- Refill SSRI, stable    Orders Placed This Encounter  Procedures  . CMP14+EGFR  . CBC with Differential/Platelet  . Sedimentation Rate    Meds ordered this encounter  Medications  . sertraline (ZOLOFT) 100 MG tablet    Sig: Take 1 tablet (100 mg total) by mouth every morning.    Dispense:  90 tablet    Refill:  3  . sodium chloride (OCEAN) 0.65 % SOLN nasal spray    Sig: Place 1 spray into both nostrils as needed for congestion.    Dispense:  1 Bottle    Refill:  prn  . omeprazole (PRILOSEC) 40 MG capsule    Sig: Take 1 capsule (40 mg total) by mouth every morning.    Dispense:  90 capsule    Refill:  1  . lovastatin (MEVACOR) 40 MG tablet    Sig: Take 1 tablet (40 mg total) by mouth at bedtime.    Dispense:  90 tablet    Refill:  3  DC niacin and fenofibrate  . fluconazole (DIFLUCAN) 150 MG tablet    Sig: Take one pill and repeat in 3 days    Dispense:  2 tablet    Refill:  0    Laroy Apple, MD [facility] 05/24/2017, 3:49 PM

## 2017-05-24 NOTE — Patient Instructions (Signed)
Great to see you!  Come back in 2 months for repeat labs ( please come fasting)  We will keep an eye on the rash and do a biopsy if needed, hopefully it will improve prior to that.

## 2017-05-25 LAB — CMP14+EGFR
ALT: 16 IU/L (ref 0–32)
AST: 17 IU/L (ref 0–40)
Albumin/Globulin Ratio: 1.5 (ref 1.2–2.2)
Albumin: 4.1 g/dL (ref 3.5–4.8)
Alkaline Phosphatase: 82 IU/L (ref 39–117)
BUN/Creatinine Ratio: 10 — ABNORMAL LOW (ref 12–28)
BUN: 12 mg/dL (ref 8–27)
Bilirubin Total: 0.3 mg/dL (ref 0.0–1.2)
CO2: 29 mmol/L (ref 18–29)
Calcium: 9.8 mg/dL (ref 8.7–10.3)
Chloride: 99 mmol/L (ref 96–106)
Creatinine, Ser: 1.17 mg/dL — ABNORMAL HIGH (ref 0.57–1.00)
GFR calc Af Amer: 52 mL/min/{1.73_m2} — ABNORMAL LOW (ref >59)
GFR calc non Af Amer: 45 mL/min/{1.73_m2} — ABNORMAL LOW (ref >59)
Globulin, Total: 2.8 g/dL (ref 1.5–4.5)
Glucose: 110 mg/dL — ABNORMAL HIGH (ref 65–99)
Potassium: 4.2 mmol/L (ref 3.5–5.2)
Sodium: 142 mmol/L (ref 134–144)
Total Protein: 6.9 g/dL (ref 6.0–8.5)

## 2017-05-25 LAB — CBC WITH DIFFERENTIAL/PLATELET
Basophils Absolute: 0 10*3/uL (ref 0.0–0.2)
Basos: 0 %
EOS (ABSOLUTE): 0.1 10*3/uL (ref 0.0–0.4)
Eos: 1 %
Hematocrit: 41.7 % (ref 34.0–46.6)
Hemoglobin: 14.1 g/dL (ref 11.1–15.9)
Immature Grans (Abs): 0 10*3/uL (ref 0.0–0.1)
Immature Granulocytes: 0 %
Lymphocytes Absolute: 2.2 10*3/uL (ref 0.7–3.1)
Lymphs: 35 %
MCH: 31.5 pg (ref 26.6–33.0)
MCHC: 33.8 g/dL (ref 31.5–35.7)
MCV: 93 fL (ref 79–97)
Monocytes Absolute: 0.7 10*3/uL (ref 0.1–0.9)
Monocytes: 11 %
Neutrophils Absolute: 3.3 10*3/uL (ref 1.4–7.0)
Neutrophils: 53 %
Platelets: 218 10*3/uL (ref 150–379)
RBC: 4.48 x10E6/uL (ref 3.77–5.28)
RDW: 13.1 % (ref 12.3–15.4)
WBC: 6.2 10*3/uL (ref 3.4–10.8)

## 2017-05-25 LAB — SEDIMENTATION RATE: Sed Rate: 17 mm/hr (ref 0–40)

## 2017-05-30 ENCOUNTER — Other Ambulatory Visit: Payer: Self-pay | Admitting: Family Medicine

## 2017-05-30 DIAGNOSIS — M1990 Unspecified osteoarthritis, unspecified site: Secondary | ICD-10-CM

## 2017-05-30 MED ORDER — FLUCONAZOLE 150 MG PO TABS: ORAL_TABLET | ORAL | 0 refills | Status: DC

## 2017-05-30 NOTE — Telephone Encounter (Signed)
Pt notified of RX 

## 2017-05-30 NOTE — Telephone Encounter (Signed)
Continued itching with discharge Please advise

## 2017-05-30 NOTE — Telephone Encounter (Signed)
Refilled, will need to be seen for wet prep if symptoms are not improving.   Laroy Apple, MD [facility] 05/30/2017, 11:50 AM

## 2017-06-08 ENCOUNTER — Ambulatory Visit (INDEPENDENT_AMBULATORY_CARE_PROVIDER_SITE_OTHER): Payer: Medicare Other | Admitting: Family Medicine

## 2017-06-08 ENCOUNTER — Encounter: Payer: Self-pay | Admitting: Family Medicine

## 2017-06-08 VITALS — BP 144/80 | HR 71 | Ht 64.0 in | Wt 177.6 lb

## 2017-06-08 DIAGNOSIS — M79604 Pain in right leg: Secondary | ICD-10-CM | POA: Diagnosis not present

## 2017-06-08 DIAGNOSIS — R7989 Other specified abnormal findings of blood chemistry: Secondary | ICD-10-CM

## 2017-06-08 DIAGNOSIS — M1990 Unspecified osteoarthritis, unspecified site: Secondary | ICD-10-CM | POA: Diagnosis not present

## 2017-06-08 DIAGNOSIS — L304 Erythema intertrigo: Secondary | ICD-10-CM

## 2017-06-08 LAB — BMP8+EGFR
BUN/Creatinine Ratio: 15 (ref 12–28)
BUN: 10 mg/dL (ref 8–27)
CO2: 29 mmol/L (ref 20–29)
Calcium: 9.9 mg/dL (ref 8.7–10.3)
Chloride: 99 mmol/L (ref 96–106)
Creatinine, Ser: 0.66 mg/dL (ref 0.57–1.00)
GFR calc Af Amer: 99 mL/min/{1.73_m2} (ref >59)
GFR calc non Af Amer: 86 mL/min/{1.73_m2} (ref >59)
Glucose: 102 mg/dL — ABNORMAL HIGH (ref 65–99)
Potassium: 3.9 mmol/L (ref 3.5–5.2)
Sodium: 141 mmol/L (ref 134–144)

## 2017-06-08 MED ORDER — HYDROCODONE-ACETAMINOPHEN 7.5-325 MG PO TABS: 0.5000 | ORAL_TABLET | Freq: Four times a day (QID) | ORAL | 0 refills | Status: DC | PRN

## 2017-06-08 MED ORDER — NYSTATIN 100000 UNIT/GM EX CREA: 1.0000 "application " | TOPICAL_CREAM | Freq: Two times a day (BID) | CUTANEOUS | 99 refills | Status: DC

## 2017-06-08 MED ORDER — NYSTATIN 100000 UNIT/GM EX CREA: 1.0000 "application " | TOPICAL_CREAM | Freq: Two times a day (BID) | CUTANEOUS | 0 refills | Status: DC

## 2017-06-08 NOTE — Patient Instructions (Signed)
Great to see you!  Come back in 3 months unless you need [place] sooner.   I have sent  Referral to Dr. Paulla Fore at [facility] sports medicine.   Try tylenol [id number] mg for pain.

## 2017-06-08 NOTE — Progress Notes (Signed)
   HPI  Patient presents today for follow-up elevated creatinine, knee pain, and intertrigo.  Intertrigo under the bilateral breast has returned, needs refill of nystatin, she usually has good response to this.  Right knee pain Continued, patient has seen orthopedics, x-ray was unchanged, injection was given with no improvement. Previous visit with me I recommended continued conservative treatment, she has not improved. Complains of medial proximal right leg pain, no persistent actual knee pain. Patient's finding it difficult to walk and work. She had good improvement with ibuprofen previously, however I recommended stopping this due to elevated creatinine.  Patient has stopped creatinine for the last 2 weeks. Would like the labs today. She has used hydrocodone chronically, her last refill was February, she did have tramadol for orthopedics since our last refill which was discussed last visit. Tramadol was not helpful.  PMH: Smoking status noted ROS: Per HPI  Objective: BP (!) 144/80   Pulse 71   Ht 5\' 4"  (1.626 m)   Wt 177 lb 9.6 oz (80.6 kg)   BMI 30.48 kg/m  Gen: NAD, alert, cooperative with exam HEENT: NCAT CV: RRR, good S1/S2, no murmur Resp: CTABL, no wheezes, non-labored Ext: No edema, warm Neuro: Alert and oriented, No gross deficits  MSK: R knee without erythema, effusion, bruising No joint line tenderness.  ligamentously intact to Lachman's and with varus and valgus stress.  Negative McMurray's test Point tenderness the proximal right leg, no visible abnormality in this area, no erythema, no induration, no apparent bony abnormality.   Assessment and plan:  # Right leg pain Refer to sports medicine for their opinion, appreciate evaluation and recommendations. Recommended trying full dose Tylenol for pain Refill narcotics  # Elevated creatinine Likely due to NSAIDs, repeat BMP  # Intertrigo Refill nystatin, patient generally has good response to  this.    Orders Placed This Encounter  Procedures  . Ambulatory referral to Sports Medicine    Referral Priority:   Routine    Referral Type:   Consultation    Number of Visits Requested:   1    Meds ordered this encounter  Medications  . OVER THE COUNTER MEDICATION    Sig: Stool softener  . OVER THE COUNTER MEDICATION    Sig: preservision  . nystatin cream (MYCOSTATIN)    Sig: Apply 1 application topically 2 (two) times daily.    Dispense:  30 g    Refill:  0  . HYDROcodone-acetaminophen (NORCO) 7.5-325 MG tablet    Sig: Take 0.5-1 tablets by mouth every 6 (six) hours as needed for moderate pain.    Dispense:  60 tablet    Refill:  0    Laroy Apple, MD [facility] 06/08/2017, 8:35 AM

## 2017-06-14 ENCOUNTER — Encounter: Payer: Self-pay | Admitting: Vascular Surgery

## 2017-06-14 ENCOUNTER — Encounter: Payer: Self-pay | Admitting: Sports Medicine

## 2017-06-14 ENCOUNTER — Ambulatory Visit: Payer: Self-pay

## 2017-06-14 ENCOUNTER — Ambulatory Visit (INDEPENDENT_AMBULATORY_CARE_PROVIDER_SITE_OTHER): Payer: Medicare Other | Admitting: Sports Medicine

## 2017-06-14 DIAGNOSIS — G8929 Other chronic pain: Secondary | ICD-10-CM

## 2017-06-14 DIAGNOSIS — M25561 Pain in right knee: Secondary | ICD-10-CM

## 2017-06-14 DIAGNOSIS — M23306 Other meniscus derangements, unspecified meniscus, right knee: Secondary | ICD-10-CM

## 2017-06-14 DIAGNOSIS — M1711 Unilateral primary osteoarthritis, right knee: Secondary | ICD-10-CM

## 2017-06-14 NOTE — Progress Notes (Signed)
OFFICE VISIT NOTE Danielle Parrish. Danielle Parrish, [place] at [facility] [phone]  Danielle Parrish - 76 y.o. female MRN [id number]  Date of birth: 10-27-1941  Visit Date: 06/14/2017  PCP: Timmothy Euler, MD   Referred by: Timmothy Euler, MD  Autumn McNeil,cma acting as scribe for Dr. Paulla Fore.  SUBJECTIVE:  No chief complaint on file.  HPI: As below and per problem based documentation when appropriate.  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 Previously seen by Orthopedic MD, xray performed. A few Steroid Injections done with very minimal relief. No relief with tramadol or hydrocodone. Unable to take Ibuprofen.due to affecting kidney function.  Taking 1 Tylenol every 4 hours with some relief. Works as a Quarry manager at [employer], therapeutic, on her feet constantly. She has been wearing a brace with support stocking that helps some. Going from a sitting to standing position triggers severe pain. Does not wake her up at night. Resting gives her the most relief. Minimal swelling. No extensive imaging done. Temporary relief with Voltaren gel.    Review of Systems  Constitutional: Negative for chills, diaphoresis, fever, malaise/fatigue and weight loss.  HENT: Negative.   Eyes: Negative.   Respiratory: Negative.   Cardiovascular: Negative.   Gastrointestinal: Negative.   Genitourinary: Negative.   Musculoskeletal: Positive for joint pain and myalgias. Negative for back pain, falls and neck pain.  Skin: Positive for rash. Negative for itching.  Neurological: Negative.   Endo/Heme/Allergies: Negative for environmental allergies and polydipsia. Does not bruise/bleed easily.  Psychiatric/Behavioral: Negative.     Otherwise per HPI.  HISTORY & PERTINENT PRIOR DATA:  No specialty comments available. She reports that she has never smoked. She has never used smokeless tobacco.   Recent Labs  04/16/17 1828  LABURIC 3.9   Medications &  Allergies reviewed per EMR Patient Active Problem List   Diagnosis Date Noted  . Meniscus degeneration, right 06/14/2017  . Varicose veins of left lower extremity with complications 93/57/0177  . Pain medication agreement signed 06/06/2016  . Gout attack 01/11/2016  . OSA (obstructive sleep apnea) 09/14/2015  . Chronic diastolic heart failure ([place]) 09/10/2015  . Orthopnea 09/02/2015  . Dysphagia, pharyngoesophageal phase 08/04/2015  . Anorexia 08/04/2015  . Fatigue 08/04/2015  . Malaise and fatigue 08/04/2015  . Depression 04/08/2015  . Osteoarthritis of left knee 09/30/2014  . Vitamin D deficiency   . Anxiety   . GERD (gastroesophageal reflux disease)   . Arthritis   . Chronic pain of multiple joints   . HTN (hypertension) 05/15/2013  . HLD (hyperlipidemia) 05/15/2013  . Prediabetes 05/15/2013  . Obesity 05/15/2013  . Hypothyroidism 05/15/2013   Past Medical History:  Diagnosis Date  . Anxiety   . Arthritis   . CHF (congestive heart failure) ([place])   . Chronic pain of multiple joints   . Depression   . Essential hypertension   . GERD (gastroesophageal reflux disease)   . Hyperlipidemia   . Hypothyroidism   . Osteoporosis   . Sleep apnea    CPAP  . Vitamin D deficiency    Family History  Problem Relation Age of Onset  . Hypertension Mother   . Stroke Mother   . Cancer Father    Past Surgical History:  Procedure Laterality Date  . ABDOMINAL HERNIA REPAIR    . ABDOMINAL HYSTERECTOMY  1977  . KNEE ARTHROSCOPY Left 09/30/2014   Procedure: LEFT KNEE ARTHROSCOPY MEDIAL MENISECTOMY ABRASION CHONDROPLASTY  SYNOVECTOMY SUPRAPATELLER CUFF;  Surgeon: Tobi Bastos, MD;  Location: [facility];  Service: Orthopedics;  Laterality: Left;   Social History   Occupational History  . Not on file.   Social History Main Topics  . Smoking status: Never Smoker  . Smokeless tobacco: Never Used  . Alcohol use No  . Drug use: No  . Sexual activity: Not on file    OBJECTIVE:    VS:  HT:    WT:   BMI:     BP:   HR: bpm  TEMP: ( )  RESP:  EXAM: Findings:  WDWN, NAD, Non-toxic appearing Alert & appropriately interactive Not depressed or anxious appearing No increased work of breathing. Pupils are equal. EOM intact without nystagmus No clubbing or cyanosis of the extremities appreciated No significant rashes/lesions/ulcerations overlying the examined area. DP & PT pulses 2+/4.  No significant pretibial edema. Sensation intact to light touch in lower extremities.  Right Knee: Generalized osteoarthritic bossing..   No significant effusion.   ROM: 0 to 120.   Extensor mechanism intact No significant medial or lateral joint line tenderness.  Tenderness to palpation over the medial proximal tibia just distal to the joint line.  No focal pain over the pes bursa but this is moderately tender.  No pain over the past musculature. Stable endpoint with valgus testing however 2-3 mm of opening.  Varus strain is stable.      No mechanical clicking with McMurray's..     Prior x-rays reviewed that show more patellofemoral wear on the right.  She has medial compartment collapse on the left but is asymptomatic.     No results found. ASSESSMENT & PLAN:   Problem List Items Addressed This Visit    Meniscus degeneration, right    Para meniscal cyst was injected as this is the area of her most significant symptoms.  If any lack of improvement can consider alternative injections including Visco supplementation.  6 week follow-up for clinical reevaluation.  Compression bracing discussed.  ++++++++++++++++++++++++++++++++++++++++++++ PROCEDURE NOTE -  ULTRASOUND GUIDEDINJECTION: Right medial parameniscal cyst injection Images were obtained and interpreted by myself, Teresa Coombs, DO  Images have been saved and stored to PACS system. Images obtained on: GE S7 Ultrasound machine  ULTRASOUND FINDINGS: Small effusion, degenerative spurring along the medial and lateral  joint lines.  Large degenerative bulging of the medial meniscus with inferior para meniscal cyst that is present but small.  No significant swelling or edema around the insertion of the peds anserine bursa.  DESCRIPTION OF PROCEDURE:  The patient's clinical condition is marked by substantial pain and/or significant functional disability. Other conservative therapy has not provided relief, is contraindicated, or not appropriate. There is a reasonable likelihood that injection will significantly improve the patient's pain and/or functional impairment. After discussing the risks, benefits and expected outcomes of the injection and all questions were reviewed and answered, the patient wished to undergo the above named procedure. Verbal consent was obtained. The ultrasound was used to identify the target structure and adjacent neurovascular structures. The skin was then prepped in sterile fashion and the target structure was injected under direct visualization using sterile technique as below: PREP: Alcohol, Ethel Chloride APPROACH: Medial, single injection 25g 1.5" needle INJECTATE: 2cc 1% lidocaine, 2cc 40mg  DepoMedrol ASPIRATE: N/A DRESSING: Band-Aid and 6" Ace-Wrap  Post procedural instructions including recommending icing and warning signs for infection were reviewed. This procedure was well tolerated and there were no complications.   IMPRESSION: Succesful US Guided Injection  Other Visit Diagnoses    Chronic pain of right knee    -  Primary   Relevant Orders   [place] LIMITED JOINT SPACE STRUCTURES LOW RIGHT(NO LINKED CHARGES)   Primary osteoarthritis of right knee          Follow-up: Return in about 6 weeks (around 07/26/2017).   CMA/ATC served as Education administrator during this visit. History, Physical, and Plan performed by medical provider. Documentation and orders reviewed and attested to.      Teresa Coombs, [facility] Physician

## 2017-06-14 NOTE — Assessment & Plan Note (Signed)
Para meniscal cyst was injected as this is the area of her most significant symptoms.  If any lack of improvement can consider alternative injections including Visco supplementation.  6 week follow-up for clinical reevaluation.  Compression bracing discussed.  ++++++++++++++++++++++++++++++++++++++++++++ PROCEDURE NOTE -  ULTRASOUND GUIDEDINJECTION: Right medial parameniscal cyst injection Images were obtained and interpreted by myself, Teresa Coombs, DO  Images have been saved and stored to PACS system. Images obtained on: GE S7 Ultrasound machine  ULTRASOUND FINDINGS: Small effusion, degenerative spurring along the medial and lateral joint lines.  Large degenerative bulging of the medial meniscus with inferior para meniscal cyst that is present but small.  No significant swelling or edema around the insertion of the peds anserine bursa.  DESCRIPTION OF PROCEDURE:  The patient's clinical condition is marked by substantial pain and/or significant functional disability. Other conservative therapy has not provided relief, is contraindicated, or not appropriate. There is a reasonable likelihood that injection will significantly improve the patient's pain and/or functional impairment. After discussing the risks, benefits and expected outcomes of the injection and all questions were reviewed and answered, the patient wished to undergo the above named procedure. Verbal consent was obtained. The ultrasound was used to identify the target structure and adjacent neurovascular structures. The skin was then prepped in sterile fashion and the target structure was injected under direct visualization using sterile technique as below: PREP: Alcohol, Ethel Chloride APPROACH: Medial, single injection 25g 1.5" needle INJECTATE: 2cc 1% lidocaine, 2cc 40mg  DepoMedrol ASPIRATE: N/A DRESSING: Band-Aid and 6" Ace-Wrap  Post procedural instructions including recommending icing and warning signs for infection were  reviewed. This procedure was well tolerated and there were no complications.   IMPRESSION: Succesful US Guided Injection

## 2017-06-14 NOTE — Patient Instructions (Signed)
You had an injection today.  Things to be aware of after injection are listed below: . You may experience no significant improvement or even a slight worsening in your symptoms during the first 24 to 48 hours.  After that we expect your symptoms to improve gradually over the next 2 weeks for the medicine to have its maximal effect.  You should continue to have improvement out to 6 weeks after your injection. . Dr. Giselle Brutus recommends icing the site of the injection for 20 minutes  1-2 times the day of your injection . You may shower but no swimming, tub bath or Jacuzzi for 24 hours. . If your bandage falls off this does not need to be replaced.  It is appropriate to remove the bandage after 4 hours. . You may resume light activities as tolerated unless otherwise directed per Dr. Shaivi Rothschild during your visit  POSSIBLE STEROID SIDE EFFECTS:  Side effects from injectable steroids tend to be less than when taken orally however you may experience some of the symptoms listed below.  If experienced these should only last for a short period of time. Change in menstrual flow  Edema (swelling)  Increased appetite Skin flushing (redness)  Skin rash/acne  Thrush (oral) Yeast vaginitis    Increased sweating  Depression Increased blood glucose levels Cramping and leg/calf  Euphoria (feeling happy)  POSSIBLE PROCEDURE SIDE EFFECTS: The side effects of the injection are usually fairly minimal however if you may experience some of the following side effects that are usually self-limited and will is off on their own.  If you are concerned please feel free to call the office with questions:  Increased numbness or tingling  Nausea or vomiting  Swelling or bruising at the injection site   Please call our office if if you experience any of the following symptoms over the next week as these can be signs of infection:   Fever greater than 100.5F  Significant swelling at the injection site  Significant redness or drainage  from the injection site  If after 2 weeks you are continuing to have worsening symptoms please call our office to discuss what the next appropriate actions should be including the potential for a return office visit or other diagnostic testing.    

## 2017-06-25 ENCOUNTER — Encounter: Payer: Self-pay | Admitting: Vascular Surgery

## 2017-06-25 ENCOUNTER — Ambulatory Visit (INDEPENDENT_AMBULATORY_CARE_PROVIDER_SITE_OTHER): Payer: Medicare Other | Admitting: Vascular Surgery

## 2017-06-25 VITALS — BP 140/89 | HR 69 | Temp 97.5°F | Resp 18 | Ht 64.0 in | Wt 180.4 lb

## 2017-06-25 DIAGNOSIS — I83892 Varicose veins of left lower extremities with other complications: Secondary | ICD-10-CM

## 2017-06-25 NOTE — Progress Notes (Signed)
Subjective:     Patient ID: Danielle Parrish, female   DOB: 03/07/1941, 76 y.o.   MRN: [id number]  HPI This 77 year old female returns 3 months post-laser ablation left great saphenous vein for painful varicosities and swelling. He states that she has had no swelling in the left ankle since her procedure. She also states that varicose veins are less prominent. She is having no pain. She has wearing elastic compression stockings. She has noted some new varicosities in the right calf. Past Medical History:  Diagnosis Date  . Anxiety   . Arthritis   . CHF (congestive heart failure) ([place])   . Chronic pain of multiple joints   . Depression   . Essential hypertension   . GERD (gastroesophageal reflux disease)   . Hyperlipidemia   . Hypothyroidism   . Osteoporosis   . Sleep apnea    CPAP  . Vitamin D deficiency     Social History  Substance Use Topics  . Smoking status: Never Smoker  . Smokeless tobacco: Never Used  . Alcohol use No    Family History  Problem Relation Age of Onset  . Hypertension Mother   . Stroke Mother   . Cancer Father     Allergies  Allergen Reactions  . Crestor [Rosuvastatin] Other (See Comments)    Body aches.   . Nsaids     Burns stomach.   . Penicillins Hives    Has patient had a PCN reaction causing immediate rash, facial/tongue/throat swelling, SOB or lightheadedness with hypotension: Yes Has patient had a PCN reaction causing severe rash involving mucus membranes or skin necrosis: unknown Has patient had a PCN reaction that required hospitalization No Has patient had a PCN reaction occurring within the last 10 years: No If all of the above answers are "NO", then may proceed with Cephalosporin use.   . Vytorin [Ezetimibe-Simvastatin]     Body aches.      Current Outpatient Prescriptions:  .  amLODipine (NORVASC) 10 MG tablet, Take 1 tablet (10 mg total) by mouth daily., Disp: 90 tablet, Rfl: 3 .  aspirin 81 MG chewable tablet, Chew 3 tablets by  mouth daily., Disp: , Rfl:  .  Biotin 10 MG CAPS, Take by mouth., Disp: , Rfl:  .  Cholecalciferol (VITAMIN D-3 PO), Take by mouth., Disp: , Rfl:  .  diclofenac sodium (VOLTAREN) 1 % GEL, Apply 2 g topically 4 (four) times daily., Disp: 100 g, Rfl: 5 .  diphenhydrAMINE (BENADRYL) 25 MG tablet, Take 50 mg by mouth at bedtime., Disp: , Rfl:  .  Ferrous Sulfate 90 (18 FE) MG TABS, Take 1 tablet by mouth every morning., Disp: 90 each, Rfl: 1 .  gabapentin (NEURONTIN) 100 MG capsule, Take 1 capsule (100 mg total) by mouth 2 (two) times daily., Disp: 180 capsule, Rfl: 1 .  HYDROcodone-acetaminophen (NORCO) 7.5-325 MG tablet, Take 0.5-1 tablets by mouth every 6 (six) hours as needed for moderate pain., Disp: 60 tablet, Rfl: 0 .  levothyroxine (SYNTHROID, LEVOTHROID) 100 MCG tablet, Take 1 tablet (100 mcg total) by mouth daily before breakfast., Disp: 90 tablet, Rfl: 3 .  lovastatin (MEVACOR) 40 MG tablet, Take 1 tablet (40 mg total) by mouth at bedtime., Disp: 90 tablet, Rfl: 3 .  Multiple Vitamin (MULTIVITAMIN) tablet, Take 1 tablet by mouth every morning. , Disp: , Rfl:  .  nystatin cream (MYCOSTATIN), Apply 1 application topically 2 (two) times daily., Disp: 30 g, Rfl: PRN .  omeprazole (PRILOSEC) 40  MG capsule, Take 1 capsule (40 mg total) by mouth every morning., Disp: 90 capsule, Rfl: 1 .  OVER THE COUNTER MEDICATION, Stool softener, Disp: , Rfl:  .  OVER THE COUNTER MEDICATION, preservision, Disp: , Rfl:  .  pyridOXINE (VITAMIN B-6) 100 MG tablet, Take 100 mg by mouth daily., Disp: , Rfl:  .  sertraline (ZOLOFT) 100 MG tablet, Take 1 tablet (100 mg total) by mouth every morning., Disp: 90 tablet, Rfl: 3 .  sodium chloride (OCEAN) 0.65 % SOLN nasal spray, Place 1 spray into both nostrils as needed for congestion., Disp: 1 Bottle, Rfl: prn .  vitamin C (ASCORBIC ACID) 500 MG tablet, Take 500 mg by mouth daily., Disp: , Rfl:  .  vitamin E 400 UNIT capsule, Take 400 Units by mouth daily., Disp: , Rfl:    Vitals:   06/25/17 1009  BP: 140/89  Pulse: 69  Resp: 18  Temp: 97.5 F (36.4 C)  TempSrc: Oral  SpO2: 90%  Weight: 180 lb 6.4 oz (81.8 kg)  Height: 5\' 4"  (1.626 m)    Body mass index is 30.97 kg/m.        Review of Systems Denies chest pain, dyspnea on exertion, PND, orthopnea, hemoptysis    Objective:   Physical Exam BP 140/89 (BP Location: Left Arm, Patient Position: Sitting, Cuff Size: Normal)   Pulse 69   Temp 97.5 F (36.4 C) (Oral)   Resp 18   Ht 5\' 4"  (1.626 m)   Wt 180 lb 6.4 oz (81.8 kg)   SpO2 90%   BMI 30.97 kg/m   Developed well-nourished female no apparent distress alert and oriented 3 Lungs no rhonchi or wheezing Cardiovascular regular rhythm no murmur Left leg has a few varicosities which are noted in the medial calf and posterior calf but no distal edema is noted no hyperpigmentation or ulcerations noted. 2+ dorsalis pedis pulse palpable. Right leg has a few varicosities in the medial calf with no distal edema.  Previous ultrasound performed in November 2017 revealed no significant reflux in the right great saphenous system. The ultrasound following the left great saphenous ablation showed total closure of the left great saphenous     Assessment:     Status post laser ablation left great saphenous vein for painful varicosities with swelling. The swelling is now resolved and the pain is gone. Varicosities are much less noticeable    Plan:     No need for further intervention on this patient and she'll return to see [place] on a when necessary basis

## 2017-07-02 ENCOUNTER — Telehealth: Payer: Self-pay | Admitting: Sports Medicine

## 2017-07-02 NOTE — Telephone Encounter (Signed)
Patient called to advise that te injection she got wore off a few days ago and she has almost equal pain to before she got the injection. She called back to request advice.

## 2017-07-02 NOTE — Telephone Encounter (Signed)
Pt received inj 06/14/2017.

## 2017-07-03 ENCOUNTER — Other Ambulatory Visit: Payer: Self-pay | Admitting: Family Medicine

## 2017-07-03 NOTE — Telephone Encounter (Signed)
Patient called to follow up on note. Advised no response yet. She states the her phone is broken and we might not be able to get to her via phone. She states that she will call back this afternoon.

## 2017-07-03 NOTE — Telephone Encounter (Signed)
Can try viscosupplementation and or bracing.  Can we get her approved for Monovisc or SynviscOne?  Consider DJO reaction brace

## 2017-07-03 NOTE — Telephone Encounter (Signed)
Called [phone], no answer/no VM.

## 2017-07-04 ENCOUNTER — Encounter: Payer: Self-pay | Admitting: Sports Medicine

## 2017-07-04 ENCOUNTER — Ambulatory Visit (INDEPENDENT_AMBULATORY_CARE_PROVIDER_SITE_OTHER): Payer: Medicare Other | Admitting: Sports Medicine

## 2017-07-04 VITALS — BP 124/82 | HR 69 | Ht 64.0 in | Wt 179.4 lb

## 2017-07-04 DIAGNOSIS — M23306 Other meniscus derangements, unspecified meniscus, right knee: Secondary | ICD-10-CM

## 2017-07-04 DIAGNOSIS — M25561 Pain in right knee: Secondary | ICD-10-CM

## 2017-07-04 NOTE — Progress Notes (Signed)
OFFICE VISIT NOTE [name]. Wayde Gopaul, [place] at [facility] [phone]  BERNETHA ANSCHUTZ - 76 y.o. female MRN [id number]  Date of birth: [birth year]  Visit Date: 07/04/2017  PCP: Timmothy Euler, MD   Referred by: Timmothy Euler, MD  Burlene Arnt, CMA acting as scribe for Dr. Paulla Fore.  SUBJECTIVE:   Chief Complaint  Patient presents with  . right leg pain   HPI: As below and per problem based documentation when appropriate.  Pt presents today in follow-up of recurrent right leg pain. She received steroid injection 06/14/17. Last xray was done 04/03/17. She has tried Tramadol and Hyrdocodone with no relief. Voltaren and support stocking have provided some relief in the past.   Pt reports that she did get about 2 weeks worth of relief from medial lower leg pain after receiving the injection but now the pain has returned. Pain is described as a stabbing pain that comes and goes when standing. She has minimal pain at rest. She hasn't noticed any swelling or bruising in the leg. She takes Tylenol Extra strength and gets minimal relief. She wears her brace when she is going to be up and moving. She says that she has been using the Voltaren but it doesn't seem to help. Pain is rated about 4/10 when sitting down but 8/10 when standing or walking. She has minimal pain when driving.   Pt denies fever, chills, night sweats.     Review of Systems  Constitutional: Negative for chills and fever.  Respiratory: Positive for shortness of breath and wheezing.   Cardiovascular: Negative for chest pain, palpitations and leg swelling.  Musculoskeletal: Negative for falls.  Neurological: Negative for dizziness, tingling and headaches.  Endo/Heme/Allergies: Does not bruise/bleed easily.    Otherwise per HPI.  HISTORY & PERTINENT PRIOR DATA:  20% coinsurance for Monovisc 07/03/17 BLS She reports that she has never smoked. She has never used smokeless  tobacco.   Recent Labs  04/16/17 1828  LABURIC 3.9   Medications & Allergies reviewed per EMR Patient Active Problem List   Diagnosis Date Noted  . Primary osteoarthritis of right knee 07/20/2017  . Meniscus degeneration, right 06/14/2017  . Varicose veins of left lower extremity with complications [id number]/41/[id number]  . Pain medication agreement signed 06/06/2016  . Gout attack 01/11/2016  . OSA (obstructive sleep apnea) 09/14/2015  . Chronic diastolic heart failure ([place]) 09/10/2015  . Orthopnea 09/02/2015  . Dysphagia, pharyngoesophageal phase 08/04/2015  . Anorexia 08/04/2015  . Fatigue 08/04/2015  . Malaise and fatigue 08/04/2015  . Depression 04/08/2015  . Osteoarthritis of left knee 09/30/2014  . Vitamin D deficiency   . Anxiety   . GERD (gastroesophageal reflux disease)   . Arthritis   . Chronic pain of multiple joints   . HTN (hypertension) 05/15/2013  . HLD (hyperlipidemia) 05/15/2013  . Prediabetes 05/15/2013  . Obesity 05/15/2013  . Hypothyroidism 05/15/2013   Past Medical History:  Diagnosis Date  . Anxiety   . Arthritis   . CHF (congestive heart failure) ([place])   . Chronic pain of multiple joints   . Depression   . Essential hypertension   . GERD (gastroesophageal reflux disease)   . Hyperlipidemia   . Hypothyroidism   . Osteoporosis   . Sleep apnea    CPAP  . Vitamin D deficiency    Family History  Problem Relation Age of Onset  . Hypertension Mother   . Stroke Mother   .  Cancer Father    Past Surgical History:  Procedure Laterality Date  . ABDOMINAL HERNIA REPAIR    . ABDOMINAL HYSTERECTOMY  1977  . KNEE ARTHROSCOPY Left 09/30/2014   Procedure: LEFT KNEE ARTHROSCOPY MEDIAL MENISECTOMY ABRASION CHONDROPLASTY SYNOVECTOMY SUPRAPATELLER CUFF;  Surgeon: Tobi Bastos, MD;  Location: [facility];  Service: Orthopedics;  Laterality: Left;   Social History   Occupational History  . Not on file.   Social History Main Topics  . Smoking status: Never  Smoker  . Smokeless tobacco: Never Used  . Alcohol use No  . Drug use: No  . Sexual activity: Not on file    OBJECTIVE:  VS:  HT:5\' 4"  (162.6 cm)   WT:179 lb 6.4 oz (81.4 kg)  BMI:30.9    BP:124/82  HR:69bpm  TEMP: ( )  RESP:94 % EXAM: Findings:  Right knee is overall well aligned.  She has a moderate effusion of the knee.  she has pain with McMurray's.  She is able to weight-bear and has a smooth arc but is limited in terminal extension.   No significant swelling of the lower extremity.  Sensation is intact light touch.  DP PT pulses palpable.     ASSESSMENT & PLAN:     ICD-10-CM   1. Right knee pain, unspecified chronicity M25.561 MR Knee Right Wo Contrast  2. Meniscus degeneration, right M23.306   ================================================================= Meniscus degeneration, right Given the persistent symptoms and now worsening mechanical symptoms with lack of improvement after intra-articular injection further advanced imaging for consideration of potential surgical intervention indicated.  MRI ordered today. ================================================================= Patient Instructions  We are ordering an MRI for you today.  The imaging office will be calling you to schedule your appointment after we obtain authorization from your insurance company.   Please be sure you have signed up for MyChart so that we can get your results to you.  We will be in touch with you as soon as we can.  Please know, it can take up to 3-4 business days for the radiologist and Dr. Paulla Fore to have time to review the results and determine the best appropriate action.  If there is something that appears to be surgical or needs a referral to other specialists we will let you know through [place] or telephone.  Otherwise we will plan to schedule a follow up appointment with Dr. Paulla Fore once we have the results.  ================================================================= No future  appointments.  Follow-up: Return for schedule follow-up after your have your MRI done to discuss results..   CMA/ATC served as Education administrator during this visit. History, Physical, and Plan performed by medical provider. Documentation and orders reviewed and attested to.      Teresa Coombs, [facility] Physician

## 2017-07-04 NOTE — Patient Instructions (Signed)

## 2017-07-19 ENCOUNTER — Encounter (HOSPITAL_COMMUNITY): Payer: Self-pay

## 2017-07-19 ENCOUNTER — Ambulatory Visit (HOSPITAL_COMMUNITY)
Admission: RE | Admit: 2017-07-19 | Discharge: 2017-07-19 | Disposition: A | Discharge status: Home / Self Care | Payer: Medicare Other | Source: Ambulatory Visit | Attending: Sports Medicine | Admitting: Sports Medicine

## 2017-07-19 DIAGNOSIS — X58XXXA Exposure to other specified factors, initial encounter: Secondary | ICD-10-CM | POA: Diagnosis not present

## 2017-07-19 DIAGNOSIS — S72431A Displaced fracture of medial condyle of right femur, initial encounter for closed fracture: Secondary | ICD-10-CM | POA: Diagnosis not present

## 2017-07-19 DIAGNOSIS — M25561 Pain in right knee: Secondary | ICD-10-CM | POA: Insufficient documentation

## 2017-07-19 DIAGNOSIS — M25761 Osteophyte, right knee: Secondary | ICD-10-CM | POA: Diagnosis not present

## 2017-07-19 DIAGNOSIS — M7121 Synovial cyst of popliteal space [Baker], right knee: Secondary | ICD-10-CM | POA: Insufficient documentation

## 2017-07-19 DIAGNOSIS — M25461 Effusion, right knee: Secondary | ICD-10-CM | POA: Diagnosis not present

## 2017-07-19 DIAGNOSIS — S83241A Other tear of medial meniscus, current injury, right knee, initial encounter: Secondary | ICD-10-CM | POA: Diagnosis not present

## 2017-07-19 DIAGNOSIS — M1711 Unilateral primary osteoarthritis, right knee: Secondary | ICD-10-CM | POA: Insufficient documentation

## 2017-07-19 NOTE — Telephone Encounter (Signed)
Patient does not use mychart, please call her at [phone].  She did not know you sent a message about MRI results until she just called to ask about them and I told her you messaged via mychart.  Thank you,  -LL

## 2017-07-20 ENCOUNTER — Ambulatory Visit (INDEPENDENT_AMBULATORY_CARE_PROVIDER_SITE_OTHER): Payer: Medicare Other | Admitting: Sports Medicine

## 2017-07-20 ENCOUNTER — Encounter: Payer: Self-pay | Admitting: Sports Medicine

## 2017-07-20 VITALS — BP 130/78 | HR 77 | Ht 64.0 in | Wt 178.8 lb

## 2017-07-20 DIAGNOSIS — M1711 Unilateral primary osteoarthritis, right knee: Secondary | ICD-10-CM | POA: Diagnosis not present

## 2017-07-20 NOTE — Progress Notes (Signed)
OFFICE VISIT NOTE Danielle Parrish. Danielle Parrish, [place] at [facility] [phone]  Danielle Parrish - 76 y.o. female MRN [id number]  Date of birth: November 16, 1941  Visit Date: 07/20/2017  PCP: Danielle Euler, MD   Referred by: Danielle Euler, MD  Danielle Parrish, CMA acting as scribe for Dr. Paulla Parrish.  SUBJECTIVE:   Chief Complaint  Patient presents with  . Follow-up    right knee pain   HPI: As below and per problem based documentation when appropriate.  [place] is an established patient who comes in today to review results of recent MRI of the right knee. MRI results are as follows: FINDINGS: MENISCI Medial meniscus: There is a complex tear of the body, which continues into the posterior horn has an oblique undersurface tear. Lateral meniscus: Grade 2 signal throughout the lateral meniscus without discrete tear. LIGAMENTS Cruciates:  Intact ACL and PCL. Collaterals: Medial collateral ligament is intact. Lateral collateral ligament complex is intact. CARTILAGE Patellofemoral: Full-thickness cartilage loss over the medial patellar facet with underlying subchondral marrow edema. Additional partial-thickness cartilage loss overlying the lateral patella facet and the deep trochlear groove. Medial: Diffuse cartilage thinning with areas of full-thickness cartilage loss over the medial femoral condyle. Tiny marginal osteophytes. Lateral: Diffuse cartilage thinning without focal full-thickness defect. Tiny marginal osteophytes. Joint:  Trace joint effusion. Popliteal Fossa:  Small Baker cyst.  Intact popliteus tendon. Extensor Mechanism:  Intact quadriceps tendon and patellar tendon. Bones: There is a subchondral insufficiency fracture of the medial femoral condyle with associated marrow edema. Prominent red marrow is noted in the distal femoral metaphysis. Degenerative edema within the peripheral aspect of the medial tibial plateau. Other:  None. IMPRESSION: 1. Subchondral insufficiency fracture of the medial femoral condyle. 2. Complex tear of the body and posterior horn of the medial meniscus. 3. Tricompartmental degenerative changes, worst in the medial and patellofemoral compartments.    Review of Systems  Constitutional: Negative for chills and fever.  Respiratory: Positive for shortness of breath and wheezing.   Cardiovascular: Negative for chest pain and palpitations.  Musculoskeletal: Negative for falls.  Neurological: Negative for dizziness, tingling and headaches.  Endo/Heme/Allergies: Does not bruise/bleed easily.    Otherwise per HPI.  HISTORY & PERTINENT PRIOR DATA:  20% coinsurance for Monovisc 07/03/17 BLS She reports that she has never smoked. She has never used smokeless tobacco.   Recent Labs  04/16/17 1828  LABURIC 3.9   Medications & Allergies reviewed per EMR Patient Active Problem List   Diagnosis Date Noted  . Primary osteoarthritis of right knee 07/20/2017  . Meniscus degeneration, right 06/14/2017  . Varicose veins of left lower extremity with complications 19/62/2297  . Pain medication agreement signed 06/06/2016  . Gout attack 01/11/2016  . OSA (obstructive sleep apnea) 09/14/2015  . Chronic diastolic heart failure ([place]) 09/10/2015  . Orthopnea 09/02/2015  . Dysphagia, pharyngoesophageal phase 08/04/2015  . Anorexia 08/04/2015  . Fatigue 08/04/2015  . Malaise and fatigue 08/04/2015  . Depression 04/08/2015  . Osteoarthritis of left knee 09/30/2014  . Vitamin D deficiency   . Anxiety   . GERD (gastroesophageal reflux disease)   . Arthritis   . Chronic pain of multiple joints   . HTN (hypertension) 05/15/2013  . HLD (hyperlipidemia) 05/15/2013  . Prediabetes 05/15/2013  . Obesity 05/15/2013  . Hypothyroidism 05/15/2013   Past Medical History:  Diagnosis Date  . Anxiety   . Arthritis   . CHF (congestive heart failure) ([place])   .  Chronic pain of multiple joints   .  Depression   . Essential hypertension   . GERD (gastroesophageal reflux disease)   . Hyperlipidemia   . Hypothyroidism   . Osteoporosis   . Sleep apnea    CPAP  . Vitamin D deficiency    Family History  Problem Relation Age of Onset  . Hypertension Mother   . Stroke Mother   . Cancer Father    Past Surgical History:  Procedure Laterality Date  . ABDOMINAL HERNIA REPAIR    . ABDOMINAL HYSTERECTOMY  1977  . KNEE ARTHROSCOPY Left 09/30/2014   Procedure: LEFT KNEE ARTHROSCOPY MEDIAL MENISECTOMY ABRASION CHONDROPLASTY SYNOVECTOMY SUPRAPATELLER CUFF;  Surgeon: Danielle Bastos, MD;  Location: [facility];  Service: Orthopedics;  Laterality: Left;   Social History   Occupational History  . Not on file.   Social History Main Topics  . Smoking status: Never Smoker  . Smokeless tobacco: Never Used  . Alcohol use No  . Drug use: No  . Sexual activity: Not on file    OBJECTIVE:  VS:  HT:5\' 4"  (162.6 cm)   WT:178 lb 12.8 oz (81.1 kg)  BMI:30.8    BP:130/78  HR:77bpm  TEMP: ( )  RESP:93 % EXAM: Findings:  Right lower extremities overall well aligned.  He has no significant effusion but has generalized synovitis.  He has pain with McMurray's pain is most focally over the medial proximal tibia.  No focal pain over the pedis bursa.  Pain is most focal along the joint line along the posterior medial joint line.  DP and PT pulses 2+/4.  Sensation intact to touch.  Ligamentously he has 3-4 mm of opening with valgus stressing.     Mr Knee Right Wo Contrast  Result Date: 07/19/2017 CLINICAL DATA:  Right knee pain for 4 months. EXAM: MRI OF THE RIGHT KNEE WITHOUT CONTRAST TECHNIQUE: Multiplanar, multisequence MR imaging of the knee was performed. No intravenous contrast was administered. COMPARISON:  Right knee x-rays dated April 03, 2017. FINDINGS: MENISCI Medial meniscus: There is a complex tear of the body, which continues into the posterior horn has an oblique undersurface tear. Lateral  meniscus: Grade 2 signal throughout the lateral meniscus without discrete tear. LIGAMENTS Cruciates:  Intact ACL and PCL. Collaterals: Medial collateral ligament is intact. Lateral collateral ligament complex is intact. CARTILAGE Patellofemoral: Full-thickness cartilage loss over the medial patellar facet with underlying subchondral marrow edema. Additional partial-thickness cartilage loss overlying the lateral patella facet and the deep trochlear groove. Medial: Diffuse cartilage thinning with areas of full-thickness cartilage loss over the medial femoral condyle. Tiny marginal osteophytes. Lateral: Diffuse cartilage thinning without focal full-thickness defect. Tiny marginal osteophytes. Joint:  Trace joint effusion. Popliteal Fossa:  Small Baker cyst.  Intact popliteus tendon. Extensor Mechanism:  Intact quadriceps tendon and patellar tendon. Bones: There is a subchondral insufficiency fracture of the medial femoral condyle with associated marrow edema. Prominent red marrow is noted in the distal femoral metaphysis. Degenerative edema within the peripheral aspect of the medial tibial plateau. Other: None. IMPRESSION: 1. Subchondral insufficiency fracture of the medial femoral condyle. 2. Complex tear of the body and posterior horn of the medial meniscus. 3. Tricompartmental degenerative changes, worst in the medial and patellofemoral compartments. Electronically Signed   By: Titus Dubin M.D.   On: 07/19/2017 09:21   ASSESSMENT & PLAN:     ICD-10-CM   1. Primary osteoarthritis of right knee M17.11   ================================================================= Primary osteoarthritis of right knee Mild degenerative change  more advanced on MRI than on plain films with subchondral insufficiency fracture the medial femoral condyle consistent with severe arthritis.  She has a complex tear of the posterior horn of the medial meniscus and tricompartmental degenerative changes.  Unfortunately she is not a  candidate for arthroscopic intervention and ultimately will require a total knee arthroplasty if surgical intervention is pursued.  I would like to see if we can help her symptoms from a pain standpoint with a medial offloading brace and and order and referral to [facility] has been provided to her.  We will plan to see her back in 4 weeks to see how  She is doing with her new brace and if any lack of improvement can consider repeat injection at that time.  Can also consider Visco supplementation but would like to start with repeat corticosteroid ================================================================= Patient Instructions  I would like for you to go to [facility] is located on [address] just [place].  They will be able to fit you for a knee brace that will help.  We will also get you preapproved for artificial joint fluid shots called Visco supplementation.  Medical illustrator Address: [address], [place], [place] [address] Phone: [phone] =================================================================  >50% of this 25 minute visit spent in direct patient counseling and/or coordination of care.  Discussion was focused on education regarding the in discussing the pathoetiology and anticipated clinical course of the above condition   Including surgical versus nonsurgical options.  Follow-up: Return in about 4 weeks (around 08/17/2017).   CMA/ATC served as Education administrator during this visit. History, Physical, and Plan performed by medical provider. Documentation and orders reviewed and attested to.      Teresa Coombs, [facility] Physician

## 2017-07-20 NOTE — Patient Instructions (Signed)
I would like for you to go to [facility] is located on [address] just [place].  They will be able to fit you for a knee brace that will help.  We will also get you preapproved for artificial joint fluid shots called Visco supplementation.  Medical illustrator Address: [address], [place], [place] [address] Phone: [phone]

## 2017-08-02 ENCOUNTER — Telehealth: Payer: Self-pay | Admitting: Family Medicine

## 2017-08-06 MED ORDER — LEVOTHYROXINE SODIUM 100 MCG PO TABS: 100.0000 ug | ORAL_TABLET | Freq: Every day | ORAL | 0 refills | Status: DC

## 2017-08-06 NOTE — Telephone Encounter (Signed)
Rx sent to [place], MD [facility] 08/06/2017, 7:41 AM

## 2017-08-13 NOTE — Assessment & Plan Note (Signed)
Given the persistent symptoms and now worsening mechanical symptoms with lack of improvement after intra-articular injection further advanced imaging for consideration of potential surgical intervention indicated.  MRI ordered today.

## 2017-08-15 NOTE — Assessment & Plan Note (Signed)
Mild degenerative change more advanced on MRI than on plain films with subchondral insufficiency fracture the medial femoral condyle consistent with severe arthritis.  She has a complex tear of the posterior horn of the medial meniscus and tricompartmental degenerative changes.  Unfortunately she is not a candidate for arthroscopic intervention and ultimately will require a total knee arthroplasty if surgical intervention is pursued.  I would like to see if we can help her symptoms from a pain standpoint with a medial offloading brace and and order and referral to [facility] has been provided to her.  We will plan to see her back in 4 weeks to see how  She is doing with her new brace and if any lack of improvement can consider repeat injection at that time.  Can also consider Visco supplementation but would like to start with repeat corticosteroid

## 2017-08-17 ENCOUNTER — Ambulatory Visit: Payer: Medicare Other | Admitting: Sports Medicine

## 2017-08-20 ENCOUNTER — Other Ambulatory Visit: Payer: Self-pay | Admitting: Family Medicine

## 2017-08-20 DIAGNOSIS — K219 Gastro-esophageal reflux disease without esophagitis: Secondary | ICD-10-CM

## 2017-08-23 DIAGNOSIS — N816 Rectocele: Secondary | ICD-10-CM | POA: Diagnosis not present

## 2017-10-18 ENCOUNTER — Ambulatory Visit (INDEPENDENT_AMBULATORY_CARE_PROVIDER_SITE_OTHER): Payer: Medicare Other | Admitting: Family Medicine

## 2017-10-18 ENCOUNTER — Encounter: Payer: Self-pay | Admitting: Family Medicine

## 2017-10-18 VITALS — BP 159/73 | HR 61 | Temp 97.2°F | Ht 64.0 in | Wt 183.6 lb

## 2017-10-18 DIAGNOSIS — M1990 Unspecified osteoarthritis, unspecified site: Secondary | ICD-10-CM

## 2017-10-18 DIAGNOSIS — I10 Essential (primary) hypertension: Secondary | ICD-10-CM | POA: Diagnosis not present

## 2017-10-18 DIAGNOSIS — E039 Hypothyroidism, unspecified: Secondary | ICD-10-CM

## 2017-10-18 DIAGNOSIS — E785 Hyperlipidemia, unspecified: Secondary | ICD-10-CM

## 2017-10-18 DIAGNOSIS — N3001 Acute cystitis with hematuria: Secondary | ICD-10-CM | POA: Diagnosis not present

## 2017-10-18 DIAGNOSIS — R300 Dysuria: Secondary | ICD-10-CM | POA: Diagnosis not present

## 2017-10-18 DIAGNOSIS — R7301 Impaired fasting glucose: Secondary | ICD-10-CM | POA: Diagnosis not present

## 2017-10-18 LAB — URINALYSIS, COMPLETE
Bilirubin, UA: NEGATIVE
Glucose, UA: NEGATIVE
Ketones, UA: NEGATIVE
Nitrite, UA: POSITIVE — AB
Protein, UA: NEGATIVE
Specific Gravity, UA: 1.025 (ref 1.005–1.030)
Urobilinogen, Ur: 0.2 mg/dL (ref 0.2–1.0)
pH, UA: 6 (ref 5.0–7.5)

## 2017-10-18 LAB — MICROSCOPIC EXAMINATION

## 2017-10-18 LAB — BAYER DCA HB A1C WAIVED: HB A1C (BAYER DCA - WAIVED): 6.2 % (ref <7.0)

## 2017-10-18 MED ORDER — HYDROCODONE-ACETAMINOPHEN 7.5-325 MG PO TABS: 0.5000 | ORAL_TABLET | Freq: Four times a day (QID) | ORAL | 0 refills | Status: DC | PRN

## 2017-10-18 MED ORDER — CIPROFLOXACIN HCL 250 MG PO TABS: 250.0000 mg | ORAL_TABLET | Freq: Two times a day (BID) | ORAL | 0 refills | Status: DC

## 2017-10-18 NOTE — Progress Notes (Signed)
   HPI  Patient presents today here for follow-up chronic medical conditions and UTI.  Chronic knee pain bilaterally, needs refill of hydrocodone, has not been taking this for a few months. No injury.  UTI Dysuria off and on for about 4 weeks.  No fever, chills, sweats.  She is tolerating food and fluids like usual.  Hyperlipidemia. Good medication compliance.  Hypertension Blood pressure at home is average 150s and 60s. Good medication compliance.  PMH: Smoking status noted ROS: Per HPI  Objective: BP (!) 159/73   Pulse 61   Temp (!) 97.2 F (36.2 C) (Oral)   Ht '5\' 4"'$  (1.626 m)   Wt 183 lb 9.6 oz (83.3 kg)   BMI 31.51 kg/m  Gen: NAD, alert, cooperative with exam HEENT: NCAT, EOMI, PERRL CV: RRR, good E2/A8, 3-4/1 systolic murmur Resp: CTABL, no wheezes, non-labored Ext: No edema, warm Neuro: Alert and oriented, No gross deficits  Assessment and plan:  #UTI No signs of urosepsis Treat with Cipro Culture  #Hypertension Elevated today, however in no red flags. No changes, given knee pain and UTI. Follow-up 1 month  #Chronic knee pain, pain medication agreement Refill Norco, patient has good established relationship with orthopedics  #Hypothyroidism Repeat labs, asymptomatic Continue Synthroid  Hyperlipidemia Repeat labs, continue statin Clinically stable    Orders Placed This Encounter  Procedures  . Urinalysis, Complete  . TSH  . CBC with Differential/Platelet  . CMP14+EGFR  . Lipid panel  . Bayer DCA Hb A1c Waived    Meds ordered this encounter  Medications  . HYDROcodone-acetaminophen (NORCO) 7.5-325 MG tablet    Sig: Take 0.5-1 tablets by mouth every 6 (six) hours as needed for moderate pain.    Dispense:  60 tablet    Refill:  0  . ciprofloxacin (CIPRO) 250 MG tablet    Sig: Take 1 tablet (250 mg total) by mouth 2 (two) times daily.    Dispense:  14 tablet    Refill:  0    Laroy Apple, MD [facility] 10/18/2017, 8:58 AM

## 2017-10-18 NOTE — Patient Instructions (Signed)
Great to see you!  Come [place] in 1 month to follow up for your blood pressure

## 2017-10-19 LAB — CBC WITH DIFFERENTIAL/PLATELET
Basophils Absolute: 0 10*3/uL (ref 0.0–0.2)
Basos: 0 %
EOS (ABSOLUTE): 0.1 10*3/uL (ref 0.0–0.4)
Eos: 1 %
Hematocrit: 40.2 % (ref 34.0–46.6)
Hemoglobin: 13.3 g/dL (ref 11.1–15.9)
Immature Grans (Abs): 0 10*3/uL (ref 0.0–0.1)
Immature Granulocytes: 0 %
Lymphocytes Absolute: 2.4 10*3/uL (ref 0.7–3.1)
Lymphs: 41 %
MCH: 30.7 pg (ref 26.6–33.0)
MCHC: 33.1 g/dL (ref 31.5–35.7)
MCV: 93 fL (ref 79–97)
Monocytes Absolute: 0.6 10*3/uL (ref 0.1–0.9)
Monocytes: 10 %
Neutrophils Absolute: 2.8 10*3/uL (ref 1.4–7.0)
Neutrophils: 48 %
Platelets: 229 10*3/uL (ref 150–379)
RBC: 4.33 x10E6/uL (ref 3.77–5.28)
RDW: 13.2 % (ref 12.3–15.4)
WBC: 5.8 10*3/uL (ref 3.4–10.8)

## 2017-10-19 LAB — CMP14+EGFR
ALT: 16 IU/L (ref 0–32)
AST: 16 IU/L (ref 0–40)
Albumin/Globulin Ratio: 1.8 (ref 1.2–2.2)
Albumin: 4.3 g/dL (ref 3.5–4.8)
Alkaline Phosphatase: 81 IU/L (ref 39–117)
BUN/Creatinine Ratio: 24 (ref 12–28)
BUN: 14 mg/dL (ref 8–27)
Bilirubin Total: 0.4 mg/dL (ref 0.0–1.2)
CO2: 28 mmol/L (ref 20–29)
Calcium: 9.8 mg/dL (ref 8.7–10.3)
Chloride: 100 mmol/L (ref 96–106)
Creatinine, Ser: 0.58 mg/dL (ref 0.57–1.00)
GFR calc Af Amer: 104 mL/min/{1.73_m2} (ref >59)
GFR calc non Af Amer: 90 mL/min/{1.73_m2} (ref >59)
Globulin, Total: 2.4 g/dL (ref 1.5–4.5)
Glucose: 106 mg/dL — ABNORMAL HIGH (ref 65–99)
Potassium: 4.4 mmol/L (ref 3.5–5.2)
Sodium: 141 mmol/L (ref 134–144)
Total Protein: 6.7 g/dL (ref 6.0–8.5)

## 2017-10-19 LAB — LIPID PANEL
Chol/HDL Ratio: 3.8 ratio (ref 0.0–4.4)
Cholesterol, Total: 200 mg/dL — ABNORMAL HIGH (ref 100–199)
HDL: 52 mg/dL (ref >39)
LDL Calculated: 118 mg/dL — ABNORMAL HIGH (ref 0–99)
Triglycerides: 148 mg/dL (ref 0–149)
VLDL Cholesterol Cal: 30 mg/dL (ref 5–40)

## 2017-10-19 LAB — TSH: TSH: 1.49 u[IU]/mL (ref 0.450–4.500)

## 2017-10-22 LAB — URINE CULTURE

## 2017-10-29 ENCOUNTER — Other Ambulatory Visit: Payer: Self-pay | Admitting: Family Medicine

## 2017-11-01 ENCOUNTER — Telehealth: Payer: Self-pay | Admitting: Family Medicine

## 2017-11-01 MED ORDER — FLUCONAZOLE 150 MG PO TABS: ORAL_TABLET | ORAL | 0 refills | Status: DC

## 2017-11-01 NOTE — Telephone Encounter (Signed)
Diflucan sent  Laroy Apple, MD [facility] 11/01/2017, 2:38 PM

## 2017-11-01 NOTE — Telephone Encounter (Signed)
White Vaginal discharge with burning and itching x1 week. Pt was recently on ABX. Pt wants [place]. Please advise. Pt states she has no Allergies.

## 2017-11-12 ENCOUNTER — Other Ambulatory Visit: Payer: Self-pay | Admitting: Family Medicine

## 2017-11-12 DIAGNOSIS — Z1231 Encounter for screening mammogram for malignant neoplasm of breast: Secondary | ICD-10-CM

## 2017-11-13 NOTE — Telephone Encounter (Signed)
Unable to contact patient.  This encounter will now be closed

## 2017-11-14 ENCOUNTER — Encounter: Payer: Self-pay | Admitting: Family

## 2017-11-14 ENCOUNTER — Ambulatory Visit (INDEPENDENT_AMBULATORY_CARE_PROVIDER_SITE_OTHER): Payer: Medicare Other | Admitting: Family

## 2017-11-14 ENCOUNTER — Ambulatory Visit (INDEPENDENT_AMBULATORY_CARE_PROVIDER_SITE_OTHER): Payer: Medicare Other | Admitting: *Deleted

## 2017-11-14 ENCOUNTER — Telehealth: Payer: Self-pay | Admitting: Family Medicine

## 2017-11-14 ENCOUNTER — Other Ambulatory Visit: Payer: Self-pay | Admitting: Family Medicine

## 2017-11-14 VITALS — BP 128/76 | HR 76 | Temp 97.0°F | Ht 64.0 in | Wt 183.4 lb

## 2017-11-14 DIAGNOSIS — B373 Candidiasis of vulva and vagina: Secondary | ICD-10-CM

## 2017-11-14 DIAGNOSIS — Z23 Encounter for immunization: Secondary | ICD-10-CM | POA: Diagnosis not present

## 2017-11-14 DIAGNOSIS — B372 Candidiasis of skin and nail: Secondary | ICD-10-CM

## 2017-11-14 DIAGNOSIS — B3731 Acute candidiasis of vulva and vagina: Secondary | ICD-10-CM

## 2017-11-14 LAB — WET PREP FOR TRICH, YEAST, CLUE
Clue Cell Exam: NEGATIVE
Trichomonas Exam: NEGATIVE
Yeast Exam: POSITIVE — AB

## 2017-11-14 MED ORDER — NYSTATIN 100000 UNIT/GM EX POWD: Freq: Four times a day (QID) | CUTANEOUS | 0 refills | Status: DC

## 2017-11-14 MED ORDER — FLUCONAZOLE 150 MG PO TABS: 150.0000 mg | ORAL_TABLET | ORAL | 0 refills | Status: DC | PRN

## 2017-11-14 NOTE — Patient Instructions (Signed)
Skin Yeast Infection Skin yeast infection is a condition in which there is an overgrowth of yeast (candida) that normally lives on the skin. This condition usually occurs in areas of the skin that are constantly warm and moist, such as the armpits or the groin. What are the causes? This condition is caused by a change in the normal balance of the yeast and bacteria that live on the skin. What increases the risk? This condition is more likely to develop in:  People who are obese.  Pregnant women.  Women who take birth control pills.  People who have diabetes.  People who take antibiotic medicines.  People who take steroid medicines.  People who are malnourished.  People who have a weak defense (immune) system.  People who are 65 years of age or older.  What are the signs or symptoms? Symptoms of this condition include:  A red, swollen area of the skin.  Bumps on the skin.  Itchiness.  How is this diagnosed? This condition is diagnosed with a medical history and physical exam. Your health care provider may check for yeast by taking light scrapings of the skin to be viewed under a microscope. How is this treated? This condition is treated with medicine. Medicines may be prescribed or be available over-the-counter. The medicines may be:  Taken by mouth (orally).  Applied as a cream.  Follow these instructions at home:  Take or apply over-the-counter and prescription medicines only as told by your health care provider.  Eat more yogurt. This may help to keep your yeast infection from returning.  Maintain a healthy weight. If you need help losing weight, talk with your health care provider.  Keep your skin clean and dry.  If you have diabetes, keep your blood sugar under control. Contact a health care provider if:  Your symptoms go away and then return.  Your symptoms do not get better with treatment.  Your symptoms get worse.  Your rash spreads.  You have a  fever or chills.  You have new symptoms.  You have new warmth or redness of your skin. This information is not intended to replace advice given to you by your health care provider. Make sure you discuss any questions you have with your health care provider. Document Released: 08/29/2011 Document Revised: 08/06/2016 Document Reviewed: 06/14/2015 Elsevier Interactive Patient Education  2018 Elsevier Inc.  

## 2017-11-14 NOTE — Telephone Encounter (Signed)
Patient scheduled for appointment today

## 2017-11-14 NOTE — Telephone Encounter (Signed)
Please have her schedule an appt to be seen w/ same day provider.  Yeast infection should be cleared up.  There may be something else going on

## 2017-11-14 NOTE — Telephone Encounter (Signed)
Patient states she was on an antibiotic a few weeks ago for a UTI and developed a yeast infection afterwards.  Dr. Wendi Snipes prescribed 2 diflucan for patient on 11/01/17 and she took these, but symptoms did not completely resolve.  Please advise.

## 2017-11-14 NOTE — Progress Notes (Signed)
error 

## 2017-11-14 NOTE — Addendum Note (Signed)
Addended by: Shelbie Ammons on: 11/14/2017 01:00 PM   Modules accepted: Orders

## 2017-11-14 NOTE — Progress Notes (Signed)
   Subjective:    Patient ID: Danielle Parrish, female    DOB: February 07, 1941, 76 y.o.   MRN: [id number]  Vaginal Discharge  The patient's primary symptoms include genital itching, a genital odor and vaginal discharge. The patient's pertinent negatives include no genital lesions. The current episode started 1 to 4 weeks ago. The problem occurs intermittently. The problem has been waxing and waning. The pain is mild. Associated symptoms include rash. Treatments tried: difluican. The treatment provided mild relief.  Rash  This is a recurrent problem. The current episode started 1 to 4 weeks ago. The problem has been waxing and waning since onset. Location: groin and bilateral breast. The rash is characterized by itchiness, pain and redness. Treatments tried: nystatin. The treatment provided mild relief.      Review of Systems  Genitourinary: Positive for vaginal discharge.  Skin: Positive for rash.  All other systems reviewed and are negative.      Objective:   Physical Exam  Constitutional: She is oriented to person, place, and time. She appears well-developed and well-nourished. No distress.  HENT:  Head: Normocephalic.  Eyes: Pupils are equal, round, and reactive to light.  Neck: Normal range of motion. Neck supple. No thyromegaly present.  Cardiovascular: Normal rate, regular rhythm, normal heart sounds and intact distal pulses.  No murmur heard. Pulmonary/Chest: Effort normal and breath sounds normal. No respiratory distress. She has no wheezes.  Abdominal: Soft. Bowel sounds are normal. She exhibits no distension. There is no tenderness.  Musculoskeletal: Normal range of motion. She exhibits no edema or tenderness.  Neurological: She is alert and oriented to person, place, and time. She has normal reflexes. No cranial nerve deficit.  Skin: Skin is warm and dry. Rash noted.  Erythemas yeast rash under bilateral breast and groin  Psychiatric: She has a normal mood and affect. Her  behavior is normal. Judgment and thought content normal.  Vitals reviewed.     BP 128/76   Pulse 76   Temp (!) 97 F (36.1 C) (Oral)   Ht 5\' 4"  (1.626 m)   Wt 183 lb 6.4 oz (83.2 kg)   BMI 31.48 kg/m      Assessment & Plan:  1. Vagina, candidiasis Keep clean and dry Do not scratch  - fluconazole (DIFLUCAN) 150 MG tablet; Take 1 tablet (150 mg total) by mouth every three (3) days as needed.  Dispense: 3 tablet; Refill: 0  2. Skin candidiasis Continue nystatin cream Keep clean and dry - fluconazole (DIFLUCAN) 150 MG tablet; Take 1 tablet (150 mg total) by mouth every three (3) days as needed.  Dispense: 3 tablet; Refill: 0 - nystatin (MYCOSTATIN/NYSTOP) powder; Apply topically 4 (four) times daily.  Dispense: 15 g; Refill: 0  RTO prn and keep follow up with PCP  Evelina Dun, FNP

## 2017-11-14 NOTE — Telephone Encounter (Signed)
What symptoms do you have? Yeast infection  How long have you been sick? Since she was out on antibiotic  Have you been seen for this problem? no  If your provider decides to give you a prescription, which pharmacy would you like for it to be sent to? [facility]   Patient informed that this information will be sent to the clinical staff for review and that they should receive a follow up call.

## 2017-11-14 NOTE — Telephone Encounter (Signed)
Left voicemail on patient cell phone number given to me by her grandson Marjory [phone].  Asked patient to call to schedule a same day appointment for today.

## 2017-12-11 ENCOUNTER — Ambulatory Visit
Admission: RE | Admit: 2017-12-11 | Discharge: 2017-12-11 | Disposition: A | Discharge status: Home / Self Care | Payer: Medicare Other | Source: Ambulatory Visit | Attending: Family Medicine | Admitting: Family Medicine

## 2017-12-11 DIAGNOSIS — Z1231 Encounter for screening mammogram for malignant neoplasm of breast: Secondary | ICD-10-CM | POA: Diagnosis not present

## 2017-12-27 ENCOUNTER — Other Ambulatory Visit: Payer: Self-pay | Admitting: Family Medicine

## 2017-12-27 DIAGNOSIS — I10 Essential (primary) hypertension: Secondary | ICD-10-CM

## 2017-12-27 MED ORDER — GABAPENTIN 100 MG PO CAPS: ORAL_CAPSULE | ORAL | 0 refills | Status: DC

## 2017-12-27 MED ORDER — AMLODIPINE BESYLATE 10 MG PO TABS: 10.0000 mg | ORAL_TABLET | Freq: Every day | ORAL | 0 refills | Status: DC

## 2017-12-27 MED ORDER — LEVOTHYROXINE SODIUM 100 MCG PO TABS: ORAL_TABLET | ORAL | 0 refills | Status: DC

## 2017-12-27 NOTE — Telephone Encounter (Signed)
Pt aware rx sent into pharmacy. 

## 2018-01-17 ENCOUNTER — Ambulatory Visit (INDEPENDENT_AMBULATORY_CARE_PROVIDER_SITE_OTHER): Payer: Medicare Other | Admitting: Family Medicine

## 2018-01-17 ENCOUNTER — Encounter: Payer: Self-pay | Admitting: Family Medicine

## 2018-01-17 VITALS — BP 165/77 | HR 70 | Temp 97.1°F | Ht 64.0 in | Wt 184.6 lb

## 2018-01-17 DIAGNOSIS — I10 Essential (primary) hypertension: Secondary | ICD-10-CM

## 2018-01-17 DIAGNOSIS — L989 Disorder of the skin and subcutaneous tissue, unspecified: Secondary | ICD-10-CM

## 2018-01-17 DIAGNOSIS — M1990 Unspecified osteoarthritis, unspecified site: Secondary | ICD-10-CM

## 2018-01-17 MED ORDER — DOXYCYCLINE HYCLATE 100 MG PO TABS: 100.0000 mg | ORAL_TABLET | Freq: Two times a day (BID) | ORAL | 0 refills | Status: DC

## 2018-01-17 MED ORDER — HYDROCODONE-ACETAMINOPHEN 7.5-325 MG PO TABS: 0.5000 | ORAL_TABLET | Freq: Four times a day (QID) | ORAL | 0 refills | Status: DC | PRN

## 2018-01-17 MED ORDER — MUPIROCIN 2 % EX OINT: 1.0000 "application " | TOPICAL_OINTMENT | Freq: Two times a day (BID) | CUTANEOUS | 0 refills | Status: DC

## 2018-01-17 NOTE — Patient Instructions (Signed)
Great to see you!  I have referred you to dermatology, I am treating this as an infection to start with.   Finish at least 7 days of doxycyline.

## 2018-01-17 NOTE — Progress Notes (Signed)
   HPI  Patient presents today here to follow-up for chronic pain as well as skin lesion on her left arm.  States the lesions been present for 4-6 weeks, it started out as a small pimple which she popped.  She states that it began to drain clear fluid and has now become larger, red, and tender.  She has bilateral knee pain which she uses hydrocodone for intermittently, she needs a refill of medication. Knee pain is about the same to slightly worse, the medication is very effective.  She often uses only half tablet  PMH: Smoking status noted ROS: Per HPI  Objective: BP (!) 165/77   Pulse 70   Temp (!) 97.1 F (36.2 C) (Oral)   Ht 5\' 4"  (1.626 m)   Wt 184 lb 9.6 oz (83.7 kg)   BMI 31.69 kg/m  Gen: NAD, alert, cooperative with exam HEENT: NCAT CV: RRR, good S1/S2, no murmur Resp: CTABL, no wheezes, non-labored Ext: No edema, warm Neuro: Alert and oriented, No gross deficits  Repeat manual blood pressure 140/90  Assessment and plan:  #Skin lesion Patient with erythematous skin lesion of the left arm, it is firm causing some slight concern for irritated squamous cell carcinoma, however with the erythema and drainage I have gone ahead and treated it as infectious. Also refer to dermatology, she will cancel referral if the lesion completely resolves with antibiotics  #Hypertension Elevated today, improved with rest No changes to medications  #Chronic pain, osteoarthritis of the right and left knee Refill hydrocodone, patient's last refill was about 3 months ago   Orders Placed This Encounter  Procedures  . Ambulatory referral to Dermatology    Referral Priority:   Routine    Referral Type:   Consultation    Referral Reason:   Specialty Services Required    Requested Specialty:   Dermatology    Number of Visits Requested:   1    Meds ordered this encounter  Medications  . doxycycline (VIBRA-TABS) 100 MG tablet    Sig: Take 1 tablet (100 mg total) by mouth 2 (two)  times daily. 1 po bid    Dispense:  20 tablet    Refill:  0  . mupirocin ointment (BACTROBAN) 2 %    Sig: Apply 1 application topically 2 (two) times daily.    Dispense:  22 g    Refill:  0  . HYDROcodone-acetaminophen (NORCO) 7.5-325 MG tablet    Sig: Take 0.5-1 tablets by mouth every 6 (six) hours as needed for moderate pain.    Dispense:  60 tablet    Refill:  0    Laroy Apple, MD [facility] 01/17/2018, 10:35 AM

## 2018-02-04 ENCOUNTER — Other Ambulatory Visit: Payer: Self-pay | Admitting: Family Medicine

## 2018-02-04 MED ORDER — FLUCONAZOLE 150 MG PO TABS: ORAL_TABLET | ORAL | 0 refills | Status: DC

## 2018-02-04 NOTE — Telephone Encounter (Signed)
Diflucan sent.   Laroy Apple, MD [facility] 02/04/2018, 5:16 PM  '

## 2018-02-04 NOTE — Telephone Encounter (Signed)
Patient aware via voicemail jkp 2/11

## 2018-02-12 ENCOUNTER — Other Ambulatory Visit: Payer: Self-pay | Admitting: Family Medicine

## 2018-02-21 ENCOUNTER — Ambulatory Visit (INDEPENDENT_AMBULATORY_CARE_PROVIDER_SITE_OTHER): Payer: Medicare Other | Admitting: Family Medicine

## 2018-02-21 ENCOUNTER — Encounter: Payer: Self-pay | Admitting: Family Medicine

## 2018-02-21 VITALS — BP 142/86 | HR 85 | Temp 97.3°F | Ht 64.0 in | Wt 184.6 lb

## 2018-02-21 DIAGNOSIS — L03115 Cellulitis of right lower limb: Secondary | ICD-10-CM | POA: Diagnosis not present

## 2018-02-21 DIAGNOSIS — F39 Unspecified mood [affective] disorder: Secondary | ICD-10-CM | POA: Diagnosis not present

## 2018-02-21 DIAGNOSIS — F32A Depression, unspecified: Secondary | ICD-10-CM

## 2018-02-21 DIAGNOSIS — F329 Major depressive disorder, single episode, unspecified: Secondary | ICD-10-CM | POA: Diagnosis not present

## 2018-02-21 DIAGNOSIS — L989 Disorder of the skin and subcutaneous tissue, unspecified: Secondary | ICD-10-CM

## 2018-02-21 MED ORDER — DOXYCYCLINE HYCLATE 100 MG PO TABS: 100.0000 mg | ORAL_TABLET | Freq: Two times a day (BID) | ORAL | 0 refills | Status: DC

## 2018-02-21 MED ORDER — FLUCONAZOLE 150 MG PO TABS: ORAL_TABLET | ORAL | 0 refills | Status: DC

## 2018-02-21 MED ORDER — SERTRALINE HCL 100 MG PO TABS: 150.0000 mg | ORAL_TABLET | ORAL | 3 refills | Status: DC

## 2018-02-21 MED ORDER — FUROSEMIDE 40 MG PO TABS: 40.0000 mg | ORAL_TABLET | Freq: Every day | ORAL | 0 refills | Status: DC

## 2018-02-21 NOTE — Progress Notes (Signed)
   HPI  Patient presents today here with bilateral feet swelling.  Patient explains that she has had bilateral foot swelling that feels tight and warm for about a week.  She states that her right leg feels warmer than the left. She denies any change in appetite, change in breathing. She denies fever, chills, sweats.  She has not had problems in her hands.  Depression Feels like Zoloft should be increased slightly, would like to take 1/2 tablets. No SI. Has increasing in crying spells when she discontinues the medication.  Skin lesion Patient was unaware that dermatology was trying to call her, she would like another referral,  left arm.  PMH: Smoking status noted ROS: Per HPI  Objective: BP (!) 142/86   Pulse 85   Temp (!) 97.3 F (36.3 C) (Oral)   Ht 5\' 4"  (1.626 m)   Wt 184 lb 9.6 oz (83.7 kg)   BMI 31.69 kg/m  Gen: NAD, alert, cooperative with exam HEENT: NCAT,  CV: RRR, good S1/S2, no murmur Resp: CTABL, no wheezes, non-labored Ext: 1+ pitting edema bilateral lower extremities, erythema bilaterally, warmth to the right lower extremity to the mid calf Neuro: Alert and oriented, No gross deficits Skin Left arm with hyperkeratotic skin lesion, likely cutaneous horn  Assessment and plan:  #Cellulitis right lower extremity Mild swelling, she does have a difference in warmth between the right and left leg Cover with doxycycline   #Skin lesion 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 Ambulatory referral to Dermatology    Referral Priority:   Routine    Referral Type:   Consultation    Referral Reason:   Specialty Services Required    Requested Specialty:   Dermatology    Number of Visits Requested:   1    Meds ordered this encounter  Medications  . furosemide (LASIX) 40 MG  tablet    Sig: Take 1 tablet (40 mg total) by mouth daily.    Dispense:  20 tablet    Refill:  0  . fluconazole (DIFLUCAN) 150 MG tablet    Sig: Take one pill and repeat in 3 days    Dispense:  2 tablet    Refill:  0  . sertraline (ZOLOFT) 100 MG tablet    Sig: Take 1.5 tablets (150 mg total) by mouth every morning.    Dispense:  135 tablet    Refill:  [place], MD [facility] 02/21/2018, 6:49 PM

## 2018-02-21 NOTE — Patient Instructions (Signed)
Great to see you!  Start doxycycline tonight for the leg redness.  You can stop after 7 days if symptoms have completely resolved.  Start Lasix 1 pill once daily for about 3 days tomorrow morning.  I have increased her Zoloft to 1/2 pills daily

## 2018-02-25 ENCOUNTER — Other Ambulatory Visit: Payer: Self-pay | Admitting: *Deleted

## 2018-02-25 DIAGNOSIS — F39 Unspecified mood [affective] disorder: Secondary | ICD-10-CM

## 2018-02-25 MED ORDER — SERTRALINE HCL 100 MG PO TABS
150.0000 mg | ORAL_TABLET | ORAL | 0 refills | Status: DC
Start: 2018-02-25 — End: 2018-06-13

## 2018-02-26 DIAGNOSIS — L821 Other seborrheic keratosis: Secondary | ICD-10-CM | POA: Diagnosis not present

## 2018-02-26 DIAGNOSIS — L570 Actinic keratosis: Secondary | ICD-10-CM | POA: Diagnosis not present

## 2018-02-26 DIAGNOSIS — D485 Neoplasm of uncertain behavior of skin: Secondary | ICD-10-CM | POA: Diagnosis not present

## 2018-02-26 DIAGNOSIS — D045 Carcinoma in situ of skin of trunk: Secondary | ICD-10-CM | POA: Diagnosis not present

## 2018-02-26 DIAGNOSIS — C44629 Squamous cell carcinoma of skin of left upper limb, including shoulder: Secondary | ICD-10-CM | POA: Diagnosis not present

## 2018-03-05 ENCOUNTER — Other Ambulatory Visit: Payer: Self-pay | Admitting: *Deleted

## 2018-03-05 DIAGNOSIS — I10 Essential (primary) hypertension: Secondary | ICD-10-CM

## 2018-03-05 MED ORDER — AMLODIPINE BESYLATE 10 MG PO TABS: 10.0000 mg | ORAL_TABLET | Freq: Every day | ORAL | 0 refills | Status: DC

## 2018-03-05 MED ORDER — LEVOTHYROXINE SODIUM 100 MCG PO TABS: ORAL_TABLET | ORAL | 3 refills | Status: DC

## 2018-03-05 NOTE — Addendum Note (Signed)
Addended by: Antonietta Barcelona D on: 03/05/2018 09:38 AM   Modules accepted: Orders

## 2018-03-19 ENCOUNTER — Other Ambulatory Visit: Payer: Self-pay | Admitting: *Deleted

## 2018-03-19 MED ORDER — GABAPENTIN 100 MG PO CAPS: ORAL_CAPSULE | ORAL | 0 refills | Status: DC

## 2018-03-20 ENCOUNTER — Other Ambulatory Visit: Payer: Self-pay | Admitting: Family Medicine

## 2018-03-20 DIAGNOSIS — I10 Essential (primary) hypertension: Secondary | ICD-10-CM

## 2018-03-21 DIAGNOSIS — C4442 Squamous cell carcinoma of skin of scalp and neck: Secondary | ICD-10-CM | POA: Diagnosis not present

## 2018-03-21 DIAGNOSIS — C44629 Squamous cell carcinoma of skin of left upper limb, including shoulder: Secondary | ICD-10-CM | POA: Diagnosis not present

## 2018-03-25 ENCOUNTER — Other Ambulatory Visit: Payer: Self-pay | Admitting: Family Medicine

## 2018-04-01 DIAGNOSIS — L0100 Impetigo, unspecified: Secondary | ICD-10-CM | POA: Diagnosis not present

## 2018-04-10 ENCOUNTER — Ambulatory Visit (INDEPENDENT_AMBULATORY_CARE_PROVIDER_SITE_OTHER): Payer: Medicare Other | Admitting: Family Medicine

## 2018-04-10 ENCOUNTER — Encounter: Payer: Self-pay | Admitting: Family Medicine

## 2018-04-10 VITALS — BP 120/73 | HR 82 | Temp 99.8°F | Ht 64.0 in | Wt 178.2 lb

## 2018-04-10 DIAGNOSIS — B372 Candidiasis of skin and nail: Secondary | ICD-10-CM | POA: Diagnosis not present

## 2018-04-10 DIAGNOSIS — M1990 Unspecified osteoarthritis, unspecified site: Secondary | ICD-10-CM

## 2018-04-10 DIAGNOSIS — R7303 Prediabetes: Secondary | ICD-10-CM | POA: Diagnosis not present

## 2018-04-10 DIAGNOSIS — R079 Chest pain, unspecified: Secondary | ICD-10-CM | POA: Diagnosis not present

## 2018-04-10 LAB — BAYER DCA HB A1C WAIVED: HB A1C (BAYER DCA - WAIVED): 5.8 % (ref <7.0)

## 2018-04-10 MED ORDER — HYDROCODONE-ACETAMINOPHEN 7.5-325 MG PO TABS: 0.5000 | ORAL_TABLET | Freq: Four times a day (QID) | ORAL | 0 refills | Status: DC | PRN

## 2018-04-10 MED ORDER — NYSTATIN 100000 UNIT/GM EX POWD: Freq: Four times a day (QID) | CUTANEOUS | 0 refills | Status: DC

## 2018-04-10 MED ORDER — FLUCONAZOLE 150 MG PO TABS: ORAL_TABLET | ORAL | 0 refills | Status: DC

## 2018-04-10 MED ORDER — NYSTATIN 100000 UNIT/GM EX CREA: 1.0000 "application " | TOPICAL_CREAM | Freq: Two times a day (BID) | CUTANEOUS | 99 refills | Status: DC

## 2018-04-10 NOTE — Progress Notes (Signed)
e

## 2018-04-10 NOTE — Patient Instructions (Signed)
Great to see you!   

## 2018-04-10 NOTE — Progress Notes (Signed)
   HPI  Patient presents today for follow-up multiple conditions as well as chest pain.  Patient states she has right-sided grabbing type chest pain that occurs frequently.  It happens at rest or with exercise.  It is not predictably exercise-induced. No association with food.  Prediabetes Watching diet moderately.  Patient has had difficulty with intertrigo of the groin and under her breast, refill requested of nystatin. Patient also has frequent vaginal irritation, she has just finished a course of antibiotics and believes she has a yeast infection.   PMH: Smoking status noted ROS: Per HPI  Objective: BP 120/73   Pulse 82   Temp 99.8 F (37.7 C) (Oral)   Ht '5\' 4"'$  (1.626 m)   Wt 178 lb 3.2 oz (80.8 kg)   BMI 30.59 kg/m  Gen: NAD, alert, cooperative with exam HEENT: NCAT CV: RRR, good S1/S2, no murmur Resp: CTABL, no wheezes, non-labored Ext: No edema, warm Neuro: Alert and oriented, No gross deficits  Assessment and plan:  #Prediabetes A1c pending, with increasing concern in frequency of yeast infection she could have uncontrolled CBGs  #Skin candidiasis, intertrigo, possible yeast infection Diflucan given, also nystatin powder and cream  #Arthritis Persistent knee pain Patient uses intermittent hydrocodone, refill  #Chest pain Atypical Risk factors include hyperlipidemia, hypertension, age EKG today shows no acute findings, does have a new inverted T wave in V2, previously only in V1 Refer to cardiology, appreciate the recommendations and evaluation No current chest pain, last episode was a few minutes this morning at rest.    Orders Placed This Encounter  Procedures  . CMP14+EGFR  . CBC with Differential/Platelet  . TSH  . Bayer DCA Hb A1c Waived  . Lipid panel  . Ambulatory referral to Cardiology    Referral Priority:   Routine    Referral Type:   Consultation    Referral Reason:   Specialty Services Required    Referred to Provider:   Satira Sark, MD    Requested Specialty:   Cardiology    Number of Visits Requested:   1  . EKG 12-Lead    Meds ordered this encounter  Medications  . nystatin (MYCOSTATIN/NYSTOP) powder    Sig: Apply topically 4 (four) times daily.    Dispense:  15 g    Refill:  0  . nystatin cream (MYCOSTATIN)    Sig: Apply 1 application topically 2 (two) times daily.    Dispense:  30 g    Refill:  PRN  . fluconazole (DIFLUCAN) 150 MG tablet    Sig: Take one pill and repeat in 3 days    Dispense:  2 tablet    Refill:  0  . HYDROcodone-acetaminophen (NORCO) 7.5-325 MG tablet    Sig: Take 0.5-1 tablets by mouth every 6 (six) hours as needed for moderate pain.    Dispense:  60 tablet    Refill:  0    Laroy Apple, MD [facility] 04/10/2018, 3:57 PM

## 2018-04-11 LAB — CMP14+EGFR
ALT: 23 IU/L (ref 0–32)
AST: 22 IU/L (ref 0–40)
Albumin/Globulin Ratio: 1.6 (ref 1.2–2.2)
Albumin: 4.4 g/dL (ref 3.5–4.8)
Alkaline Phosphatase: 83 IU/L (ref 39–117)
BUN/Creatinine Ratio: 17 (ref 12–28)
BUN: 11 mg/dL (ref 8–27)
Bilirubin Total: 0.3 mg/dL (ref 0.0–1.2)
CO2: 21 mmol/L (ref 20–29)
Calcium: 10.1 mg/dL (ref 8.7–10.3)
Chloride: 100 mmol/L (ref 96–106)
Creatinine, Ser: 0.64 mg/dL (ref 0.57–1.00)
GFR calc Af Amer: 100 mL/min/{1.73_m2} (ref >59)
GFR calc non Af Amer: 86 mL/min/{1.73_m2} (ref >59)
Globulin, Total: 2.8 g/dL (ref 1.5–4.5)
Glucose: 116 mg/dL — ABNORMAL HIGH (ref 65–99)
Potassium: 4.1 mmol/L (ref 3.5–5.2)
Sodium: 141 mmol/L (ref 134–144)
Total Protein: 7.2 g/dL (ref 6.0–8.5)

## 2018-04-11 LAB — LIPID PANEL
Chol/HDL Ratio: 3.8 ratio (ref 0.0–4.4)
Cholesterol, Total: 182 mg/dL (ref 100–199)
HDL: 48 mg/dL (ref >39)
LDL Calculated: 96 mg/dL (ref 0–99)
Triglycerides: 189 mg/dL — ABNORMAL HIGH (ref 0–149)
VLDL Cholesterol Cal: 38 mg/dL (ref 5–40)

## 2018-04-11 LAB — CBC WITH DIFFERENTIAL/PLATELET
Basophils Absolute: 0 10*3/uL (ref 0.0–0.2)
Basos: 0 %
EOS (ABSOLUTE): 0.1 10*3/uL (ref 0.0–0.4)
Eos: 1 %
Hematocrit: 43.5 % (ref 34.0–46.6)
Hemoglobin: 14.1 g/dL (ref 11.1–15.9)
Immature Grans (Abs): 0 10*3/uL (ref 0.0–0.1)
Immature Granulocytes: 0 %
Lymphocytes Absolute: 2.2 10*3/uL (ref 0.7–3.1)
Lymphs: 35 %
MCH: 31 pg (ref 26.6–33.0)
MCHC: 32.4 g/dL (ref 31.5–35.7)
MCV: 96 fL (ref 79–97)
Monocytes Absolute: 0.6 10*3/uL (ref 0.1–0.9)
Monocytes: 10 %
Neutrophils Absolute: 3.4 10*3/uL (ref 1.4–7.0)
Neutrophils: 54 %
Platelets: 234 10*3/uL (ref 150–379)
RBC: 4.55 x10E6/uL (ref 3.77–5.28)
RDW: 13.2 % (ref 12.3–15.4)
WBC: 6.2 10*3/uL (ref 3.4–10.8)

## 2018-04-11 LAB — TSH: TSH: 0.596 u[IU]/mL (ref 0.450–4.500)

## 2018-05-01 DIAGNOSIS — Z85828 Personal history of other malignant neoplasm of skin: Secondary | ICD-10-CM | POA: Diagnosis not present

## 2018-05-01 DIAGNOSIS — L570 Actinic keratosis: Secondary | ICD-10-CM | POA: Diagnosis not present

## 2018-05-01 DIAGNOSIS — D1801 Hemangioma of skin and subcutaneous tissue: Secondary | ICD-10-CM | POA: Diagnosis not present

## 2018-05-01 DIAGNOSIS — L819 Disorder of pigmentation, unspecified: Secondary | ICD-10-CM | POA: Diagnosis not present

## 2018-05-10 ENCOUNTER — Telehealth: Payer: Self-pay | Admitting: Family Medicine

## 2018-05-10 NOTE — Telephone Encounter (Signed)
Patient would like her gabapentin increased to four times a day instead of 2 times daily.  States it helps her with the higher amount and really helps her feet. Aware PCP is out of the office and will not be back until Monday- patient is fine with waiting. Please advise

## 2018-05-13 MED ORDER — GABAPENTIN 100 MG PO CAPS: 100.0000 mg | ORAL_CAPSULE | Freq: Four times a day (QID) | ORAL | 3 refills | Status: DC

## 2018-05-13 NOTE — Telephone Encounter (Signed)
Pt wanting to increase 100 mg gabapentin to QID from BID.   Ok with change, Rx sent  Laroy Apple, MD [facility] 05/13/2018, 7:40 AM

## 2018-06-09 ENCOUNTER — Other Ambulatory Visit: Payer: Self-pay | Admitting: Family Medicine

## 2018-06-09 DIAGNOSIS — I10 Essential (primary) hypertension: Secondary | ICD-10-CM

## 2018-06-10 NOTE — Telephone Encounter (Signed)
Last seen 04/10/18  Dr Bradshaw 

## 2018-06-13 ENCOUNTER — Ambulatory Visit (INDEPENDENT_AMBULATORY_CARE_PROVIDER_SITE_OTHER): Payer: Medicare Other | Admitting: Family Medicine

## 2018-06-13 ENCOUNTER — Encounter: Payer: Self-pay | Admitting: Family Medicine

## 2018-06-13 ENCOUNTER — Other Ambulatory Visit: Payer: Self-pay | Admitting: Physician Assistant

## 2018-06-13 VITALS — BP 124/79 | HR 82 | Temp 97.9°F | Ht 64.0 in | Wt 179.2 lb

## 2018-06-13 DIAGNOSIS — R05 Cough: Secondary | ICD-10-CM

## 2018-06-13 DIAGNOSIS — G8929 Other chronic pain: Secondary | ICD-10-CM | POA: Diagnosis not present

## 2018-06-13 DIAGNOSIS — M2550 Pain in unspecified joint: Secondary | ICD-10-CM | POA: Diagnosis not present

## 2018-06-13 DIAGNOSIS — L304 Erythema intertrigo: Secondary | ICD-10-CM | POA: Diagnosis not present

## 2018-06-13 DIAGNOSIS — M1990 Unspecified osteoarthritis, unspecified site: Secondary | ICD-10-CM

## 2018-06-13 DIAGNOSIS — R059 Cough, unspecified: Secondary | ICD-10-CM

## 2018-06-13 DIAGNOSIS — K068 Other specified disorders of gingiva and edentulous alveolar ridge: Secondary | ICD-10-CM

## 2018-06-13 DIAGNOSIS — F39 Unspecified mood [affective] disorder: Secondary | ICD-10-CM | POA: Diagnosis not present

## 2018-06-13 MED ORDER — HYDROCODONE-ACETAMINOPHEN 7.5-325 MG PO TABS: 0.5000 | ORAL_TABLET | Freq: Four times a day (QID) | ORAL | 0 refills | Status: DC | PRN

## 2018-06-13 MED ORDER — DOXYCYCLINE HYCLATE 100 MG PO TABS: 100.0000 mg | ORAL_TABLET | Freq: Two times a day (BID) | ORAL | 0 refills | Status: DC

## 2018-06-13 MED ORDER — SERTRALINE HCL 100 MG PO TABS: 150.0000 mg | ORAL_TABLET | ORAL | 3 refills | Status: DC

## 2018-06-13 MED ORDER — KETOCONAZOLE 2 % EX CREA: 1.0000 "application " | TOPICAL_CREAM | Freq: Every day | CUTANEOUS | 2 refills | Status: DC

## 2018-06-13 MED ORDER — FLUCONAZOLE 150 MG PO TABS: ORAL_TABLET | ORAL | 2 refills | Status: DC

## 2018-06-13 NOTE — Patient Instructions (Signed)
Great to see you!  Come back to see Danielle Parrish in 2-3 months

## 2018-06-13 NOTE — Progress Notes (Signed)
   HPI  Patient presents today here with chronic concerns and some acute problems.  Intertrigo-helped by Diflucan, using it occasionally, also helped by nystatin.  Needs a refill of Diflucan.  Cough Productive cough with thick white sputum, mild shortness of breath for 2 days. Patient also has gum pain on her left lower gumline, all of her teeth have been pulled in that area. She has tenderness to palpation, she is afraid she has an infection.  Patient needs refill of Zoloft, does well with the medication but has been out for a few days.  Chronic pain Multiple joints, knee arthritis is very significant Needs refill hydrocodone, using intermittently  PMH: Smoking status noted ROS: Per HPI  Objective: BP 124/79   Pulse 82   Temp 97.9 F (36.6 C) (Oral)   Ht 5\' 4"  (1.626 m)   Wt 179 lb 3.2 oz (81.3 kg)   BMI 30.76 kg/m  Gen: NAD, alert, cooperative with exam HEENT: NCAT, no gum lesions in the left lower gumline, no tenderness to palpation of the left jaw or parotid gland or submandibular glands, no tenderness to palpation of the sinuses CV: RRR, good S1/S2, no murmur Resp: CTABL, no wheezes, non-labored Ext: No edema, warm Neuro: Alert and oriented, No gross deficits  Assessment and plan:  #Gum pain, cough Covered with doxycycline, unlikely to be significant infection, however I understand her concerns.  #Intertrigo Diflucan given, also trial of ketoconazole cream  #Mood disorder Slightly worse, however out of medication, refill No SI  #Arthritis, chronic pain of multiple joints Refilled hydrocodone, discussed that next provider will likely review pain contract   Meds ordered this encounter  Medications  . fluconazole (DIFLUCAN) 150 MG tablet    Sig: Take one pill and repeat in 1 week    Dispense:  2 tablet    Refill:  2  . HYDROcodone-acetaminophen (NORCO) 7.5-325 MG tablet    Sig: Take 0.5-1 tablets by mouth every 6 (six) hours as needed for moderate pain.      Dispense:  60 tablet    Refill:  0  . sertraline (ZOLOFT) 100 MG tablet    Sig: Take 1.5 tablets (150 mg total) by mouth every morning.    Dispense:  135 tablet    Refill:  3  . ketoconazole (NIZORAL) 2 % cream    Sig: Apply 1 application topically daily.    Dispense:  60 g    Refill:  2  . doxycycline (VIBRA-TABS) 100 MG tablet    Sig: Take 1 tablet (100 mg total) by mouth 2 (two) times daily. 1 po bid    Dispense:  20 tablet    Refill:  0    Laroy Apple, MD [facility] 06/13/2018, 1:17 PM

## 2018-07-12 ENCOUNTER — Telehealth: Payer: Self-pay | Admitting: Family Medicine

## 2018-07-12 NOTE — Telephone Encounter (Signed)
Scheduled 7/30

## 2018-07-23 ENCOUNTER — Ambulatory Visit (INDEPENDENT_AMBULATORY_CARE_PROVIDER_SITE_OTHER): Payer: Medicare Other

## 2018-07-23 VITALS — BP 130/77 | HR 73 | Temp 98.0°F | Ht 64.0 in | Wt 170.0 lb

## 2018-07-23 DIAGNOSIS — Z0000 Encounter for general adult medical examination without abnormal findings: Secondary | ICD-10-CM

## 2018-07-23 NOTE — Patient Instructions (Signed)
  Danielle Parrish , Thank you for taking time to come for your Medicare Wellness Visit. I appreciate your ongoing commitment to your health goals. Please review the following plan we discussed and let me know if I can assist you in the future.   These are the goals we discussed: Goals    . Exercise 150 min/wk Moderate Activity       This is a list of the screening recommended for you and due dates:  Health Maintenance  Topic Date Due  . Flu Shot  07/25/2018  . Tetanus Vaccine  10/16/2023  . DEXA scan (bone density measurement)  Completed  . Pneumonia vaccines  Completed

## 2018-07-23 NOTE — Progress Notes (Addendum)
Subjective:   Danielle Parrish is a 77 y.o. female who presents for an Initial Medicare Annual Wellness Visit. She resides close by in [facility]. She currently lives alone. Her husband passed away in 03/23/2007. She has two children, a daughter that lives in [place], [place] and another daughter that lives in [place]. She is currently employed part time at [employer]. 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 She enjoys this type of work very much and it keeps her active. She also worked at [employer] for 12 years as a Quarry manager, Dentist for 29 years, [employer] for 9 years, and worked part time in [employer] many years ago. She enjoys reading the newspaper when she can. She doesn't attend church on a regular basis due to her work schedule but when she does she attends [facility] in [place]. She communicates with her children on a regular basis. She has a dog at home that she enjoys. She has steps that go down to her basement. She washes clothes in the basement and doesn't have any trouble getting up and down the stairs. She is independent of all ADLs and is very pleasant.   Review of Systems      Cardiac Risk Factors include: advanced age (>43men, >58 women);dyslipidemia;hypertension;obesity (BMI >30kg/m2)     Objective:    Today's Vitals   07/23/18 0822 07/23/18 0823  BP: 130/77   Pulse: 73   Temp: 98 F (36.7 C)   TempSrc: Oral   Weight: 170 lb (77.1 kg)   Height: 5\' 4"  (1.626 m)   PainSc:  9    Body mass index is 29.18 kg/m.  Advanced Directives 07/23/2018 06/25/2017 07/31/2016 11/06/2015 09/30/2014 09/28/2014  Does Patient Have a Medical Advance Directive? No No No No No No  Would patient like information on creating a medical advance directive? No - Patient declined No - Patient declined No - patient declined information No - patient declined information No - patient declined information No - patient declined  information   Offered patient information on an advanced directive but she declined at this time  Current Medications (verified) Outpatient Encounter Medications as of 07/23/2018  Medication Sig  . amLODipine (NORVASC) 10 MG tablet TAKE 1 TABLET BY MOUTH EVERY DAY  . aspirin 81 MG chewable tablet Chew 3 tablets by mouth daily.  . Biotin 10 MG CAPS Take by mouth.  . Cholecalciferol (VITAMIN D-3 PO) Take by mouth.  . diclofenac sodium (VOLTAREN) 1 % GEL Apply 2 g topically 4 (four) times daily.  . diphenhydrAMINE (BENADRYL) 25 MG tablet Take 50 mg by mouth at bedtime.  . Ferrous Sulfate 90 (18 FE) MG TABS Take 1 tablet by mouth every morning.  . furosemide (LASIX) 40 MG tablet Take 1 tablet (40 mg total) by mouth daily.  Marland Kitchen gabapentin (NEURONTIN) 100 MG capsule Take 100 mg by mouth 4 (four) times daily as needed.  Marland Kitchen HYDROcodone-acetaminophen (NORCO) 7.5-325 MG tablet Take 0.5-1 tablets by mouth every 6 (six) hours as needed for moderate pain.  Marland Kitchen ketoconazole (NIZORAL) 2 % cream Apply 1 application topically daily.  Marland Kitchen levothyroxine (SYNTHROID, LEVOTHROID) 100 MCG tablet Take 1 Tablet by mouth once daily BEFORE BREAKFAST  . lovastatin (MEVACOR) 40 MG tablet Take 1 tablet (40 mg total) by mouth at bedtime.  . Multiple Vitamin (MULTIVITAMIN) tablet Take 1 tablet by mouth every morning.   . mupirocin ointment (BACTROBAN)  2 % Apply 1 application topically 2 (two) times daily.  Marland Kitchen nystatin (MYCOSTATIN/NYSTOP) powder Apply topically 4 (four) times daily.  Marland Kitchen nystatin cream (MYCOSTATIN) Apply 1 application topically 2 (two) times daily.  Marland Kitchen omeprazole (PRILOSEC) 40 MG capsule Take 1 Capsule by mouth every morning  . OVER THE COUNTER MEDICATION Stool softener  . OVER THE COUNTER MEDICATION preservision  . pyridOXINE (VITAMIN B-6) 100 MG tablet Take 100 mg by mouth daily.  . sertraline (ZOLOFT) 100 MG tablet Take 1.5 tablets (150 mg total) by mouth every morning.  . sodium chloride (OCEAN) 0.65 % SOLN  nasal spray Place 1 spray into both nostrils as needed for congestion.  . vitamin C (ASCORBIC ACID) 500 MG tablet Take 500 mg by mouth daily.  . vitamin E 400 UNIT capsule Take 400 Units by mouth daily.  . [DISCONTINUED] doxycycline (VIBRA-TABS) 100 MG tablet Take 1 tablet (100 mg total) by mouth 2 (two) times daily. 1 po bid  . [DISCONTINUED] fluconazole (DIFLUCAN) 150 MG tablet Take one pill and repeat in 1 week  . [DISCONTINUED] gabapentin (NEURONTIN) 100 MG capsule Take 1 capsule (100 mg total) by mouth 4 (four) times daily. Take 1 Capsule by mouth 2 times a day  . [DISCONTINUED] gabapentin (NEURONTIN) 100 MG capsule TAKE 1 CAPSULE BY MOUTH TWICE A DAY   No facility-administered encounter medications on file as of 07/23/2018.     Allergies (verified) Crestor [rosuvastatin]; Nsaids; Penicillins; Vytorin [ezetimibe-simvastatin]; and Tolmetin   History: Past Medical History:  Diagnosis Date  . Anxiety   . Arthritis   . CHF (congestive heart failure) ([place])   . Chronic pain of multiple joints   . Depression   . Essential hypertension   . GERD (gastroesophageal reflux disease)   . Hyperlipidemia   . Hypothyroidism   . Osteoporosis   . Sleep apnea    CPAP  . Vitamin D deficiency    Past Surgical History:  Procedure Laterality Date  . ABDOMINAL HERNIA REPAIR    . ABDOMINAL HYSTERECTOMY  1977  . BREAST EXCISIONAL BIOPSY    . KNEE ARTHROSCOPY Left 09/30/2014   Procedure: LEFT KNEE ARTHROSCOPY MEDIAL MENISECTOMY ABRASION CHONDROPLASTY SYNOVECTOMY SUPRAPATELLER CUFF;  Surgeon: Tobi Bastos, MD;  Location: [facility];  Service: Orthopedics;  Laterality: Left;   Family History  Problem Relation Age of Onset  . Hypertension Mother   . Stroke Mother   . Cancer Father    Social History   Socioeconomic History  . Marital status: Widowed    Spouse name: Laverna Peace  . Number of children: 2  . Years of education: Not on file  . Highest education level: 12th grade  Occupational History    . Occupation: CNA    Comment: [employer]  . Financial resource strain: Very hard  . Food insecurity:    Worry: Sometimes true    Inability: Sometimes true  . Transportation needs:    Medical: No    Non-medical: No  Tobacco Use  . Smoking status: Never Smoker  . Smokeless tobacco: Never Used  Substance and Sexual Activity  . Alcohol use: No  . Drug use: No  . Sexual activity: Not Currently  Lifestyle  . Physical activity:    Days per week: 0 days    Minutes per session: 0 min  . Stress: Not at all  Relationships  . Social connections:    Talks on phone: More than three times a week    Gets together: More than  three times a week    Attends religious service: Never    Active member of club or organization: No    Attends meetings of clubs or organizations: Never    Relationship status: Widowed  Other Topics Concern  . Not on file  Social History Narrative  . Not on file   Patient states that sometimes she struggles paying bills and has to be careful with spending money.   Tobacco Counseling Patient has never been a smoker  Clinical Intake:  Pre-visit preparation completed: No  Pain : 0-10 Pain Score: 9  Pain Type: Chronic pain Pain Location: Knee Pain Orientation: Right, Left Pain Descriptors / Indicators: Aching, Nagging Pain Onset: More than a month ago Pain Frequency: Several days a week Pain Relieving Factors: Takes Hydrocodone prn. Has to get in a certain position to sleep Effect of Pain on Daily Activities: Makes it difficult some days  Pain Relieving Factors: Takes Hydrocodone prn. Has to get in a certain position to sleep  BMI - recorded: 30.76 Nutritional Status: BMI > 30  Obese Nutritional Risks: None Diabetes: No  How often do you need to have someone help you when you read instructions, pamphlets, or other written materials from your doctor or pharmacy?: 1 - Never What is the last grade level you completed in school?: 12th  grade  Interpreter Needed?: No  Information entered by :: Theodoro Clock LPN   Activities of Daily Living In your present state of health, do you have any difficulty performing the following activities: 07/23/2018  Hearing? Y  Comment Wears one hearing aid  Vision? Y  Comment Wears glasses all the time  Difficulty concentrating or making decisions? N  Walking or climbing stairs? Y  Comment When knees are hurting her  Dressing or bathing? N  Doing errands, shopping? N  Preparing Food and eating ? N  Using the Toilet? N  In the past six months, have you accidently leaked urine? N  Do you have problems with loss of bowel control? N  Managing your Medications? N  Managing your Finances? N  Housekeeping or managing your Housekeeping? N  Some recent data might be hidden     Immunizations and Health Maintenance Immunization History  Administered Date(s) Administered  . Influenza, High Dose Seasonal PF 11/14/2017  . Influenza,inj,Quad PF,6+ Mos 10/15/2013, 11/17/2014, 09/23/2015, 09/14/2016  . Pneumococcal Conjugate-13 04/08/2015  . Pneumococcal-Unspecified 12/26/2013  . Tdap 10/15/2013  . Zoster 12/26/2013   Patient uo to date on immunizations. Only recommended vaccine is shingrix. Checked with insurance today for price and it will be 173.13. Patient declines that today due to cost.   There are no preventive care reminders to display for this patient.  Patient Care Team: Timmothy Euler, MD as PCP - General (Family Medicine)  Indicate any recent Medical Services you may have received from other than [facility] providers in the past year (date may be approximate).     Assessment:   This is a routine wellness examination for [place].  Hearing/Vision screen Patient wears one hearing aid. She states that she needs two but cannot afford it.   Dietary issues and exercise activities discussed: Current Exercise Habits: The patient has a physically strenous job, but has no regular  exercise apart from work., Exercise limited by: orthopedic condition(s);cardiac condition(s)  Goals    . Exercise 150 min/wk Moderate Activity      Depression Screen PHQ 2/9 Scores 07/23/2018 06/13/2018 04/10/2018 02/21/2018 01/17/2018 11/14/2017 10/18/2017  PHQ - 2 Score 2  2 0 0 1 0 0  PHQ- 9 Score 8 5 - - - - -    Fall Risk Fall Risk  07/23/2018 06/13/2018 04/10/2018 02/21/2018 01/17/2018  Falls in the past year? No Yes No No No  Number falls in past yr: - 2 or more - - -  Injury with Fall? - Yes - - -  Comment - - - - -  Risk Factor Category  - - - - -  Risk for fall due to : - - - - -  Risk for fall due to: Comment - - - - -  Follow up - - - - -    Is the patient's home free of loose throw rugs in walkways, pet beds, electrical cords, etc?   yes      Grab bars in the bathroom? yes      Handrails on the stairs?   yes      Adequate lighting?   yes    Cognitive Function: MMSE - Mini Mental State Exam 07/23/2018  Orientation to time 5  Orientation to Place 5  Registration 3  Attention/ Calculation 4  Recall 2  Language- name 2 objects 2  Language- repeat 1  Language- follow 3 step command 3  Language- read & follow direction 1  Write a sentence 1  Copy design 1  Total score 28    Patient had no problem with MMSE    Screening Tests Health Maintenance  Topic Date Due  . INFLUENZA VACCINE  07/25/2018  . TETANUS/TDAP  10/16/2023  . DEXA SCAN  Completed  . PNA vac Low Risk Adult  Completed    Qualifies for Shingles Vaccine? Patient does qualify but after checking costs today it is 173.13. She declined today due to cost.   Cancer Screenings: Lung: Low Dose CT Chest recommended if Age 42-80 years, 30 pack-year currently smoking OR have quit w/in 15years. Patient does not qualify. Breast: Up to date on Mammogram? No   Patient does not want mammogram Up to date of Bone Density/Dexa? Yes Colorectal: NA  Additional Screenings:  Hepatitis C Screening:   Patient does not  qualify     Plan:     I have personally reviewed and noted the following in the patient's chart:   . Medical and social history . Use of alcohol, tobacco or illicit drugs  . Current medications and supplements . Functional ability and status . Nutritional status . Physical activity . Advanced directives . List of other physicians . Hospitalizations, surgeries, and ER visits in previous 12 months . Vitals . Screenings to include cognitive, depression, and falls . Referrals and appointments  In addition, I have reviewed and discussed with patient certain preventive protocols, quality metrics, and best practice recommendations. A written personalized care plan for preventive services as well as general preventive health recommendations were provided to patient.     Rolena Infante, LPN   04/02/1447   I have reviewed and agree with the above AWV documentation.   Terald Sleeper PA-C [facility] [address]  [place], [place] [address] [phone]

## 2018-09-11 ENCOUNTER — Ambulatory Visit (INDEPENDENT_AMBULATORY_CARE_PROVIDER_SITE_OTHER): Payer: Medicare Other

## 2018-09-11 ENCOUNTER — Ambulatory Visit: Payer: Medicare Other | Admitting: Family Medicine

## 2018-09-11 ENCOUNTER — Encounter: Payer: Self-pay | Admitting: Family Medicine

## 2018-09-11 ENCOUNTER — Ambulatory Visit (INDEPENDENT_AMBULATORY_CARE_PROVIDER_SITE_OTHER): Payer: Medicare Other | Admitting: Family Medicine

## 2018-09-11 VITALS — BP 151/85 | HR 95 | Temp 97.7°F | Ht 64.0 in | Wt 173.4 lb

## 2018-09-11 DIAGNOSIS — R0781 Pleurodynia: Secondary | ICD-10-CM | POA: Diagnosis not present

## 2018-09-11 DIAGNOSIS — W19XXXA Unspecified fall, initial encounter: Secondary | ICD-10-CM

## 2018-09-11 DIAGNOSIS — S299XXA Unspecified injury of thorax, initial encounter: Secondary | ICD-10-CM | POA: Diagnosis not present

## 2018-09-11 DIAGNOSIS — R0789 Other chest pain: Secondary | ICD-10-CM

## 2018-09-11 DIAGNOSIS — S20212A Contusion of left front wall of thorax, initial encounter: Secondary | ICD-10-CM

## 2018-09-11 NOTE — Progress Notes (Signed)
BP (!) 151/85   Pulse 95   Temp 97.7 F (36.5 C) (Oral)   Ht 5\' 4"  (1.626 m)   Wt 173 lb 6.4 oz (78.7 kg)   BMI 29.76 kg/m    Subjective:    Patient ID: Danielle Parrish, female    DOB: Jun 05, 1941, 77 y.o.   MRN: [id number]  HPI: Danielle Parrish is a 77 y.o. female presenting on 09/11/2018 for Chest Pain (Patient states she had a fall last week and c/o left sided rib pain and bilateral knee pain.)  Left side pain Patient had a fall last week and landed on her left shoulder and side as well as her knees. She tripped and fell on something. She did not feel dizziness or unsteady before her fall. She did not hit her head. The only area that is still bothering her from the fall is her left side in her upper abdominal area. It hurts the most when she is lying on her left side. She has not taken anything for the pain. She does not have pain with normal breaths, but its painful with deep inhales. She does admit to coughing more lately. She has not have a fever or trouble breathing.  Chest pain Patient has had chest pain over the past 6 months. She cannot correlate the chest pain to activity, eating, or anything in particular. When it occurs, it radiates into her back and lasts only a couple minutes. It occurs about 5 to 6 times per week. It goes away on its own. She does have a history of reflux, that she takes Omeprazole as needed.  Relevant past medical, surgical, family and social history reviewed and updated as indicated. Interim medical history since our last visit reviewed. Allergies and medications reviewed and updated.  Review of Systems  Constitutional: Negative for fever.  Respiratory: Negative for chest tightness and shortness of breath.   Cardiovascular: Positive for chest pain (intermittent). Negative for palpitations.  Musculoskeletal: Positive for myalgias (Left rib pain).  Neurological: Negative for dizziness, weakness and light-headedness.    Per HPI unless specifically  indicated above   Allergies as of 09/11/2018      Reactions   Crestor [rosuvastatin] Other (See Comments)   Body aches.    Nsaids    Burns stomach.    Penicillins Hives   Has patient had a PCN reaction causing immediate rash, facial/tongue/throat swelling, SOB or lightheadedness with hypotension: Yes Has patient had a PCN reaction causing severe rash involving mucus membranes or skin necrosis: unknown Has patient had a PCN reaction that required hospitalization No Has patient had a PCN reaction occurring within the last 10 years: No If all of the above answers are "NO", then may proceed with Cephalosporin use.   Vytorin [ezetimibe-simvastatin]    Body aches.    Tolmetin Nausea Only   Burns stomach.       Medication List        Accurate as of 09/11/18 11:59 PM. Always use your most recent med list.          amLODipine 10 MG tablet Commonly known as:  NORVASC TAKE 1 TABLET BY MOUTH EVERY DAY   aspirin 81 MG chewable tablet Chew 3 tablets by mouth daily.   Biotin 10 MG Caps Take by mouth.   diclofenac sodium 1 % Gel Commonly known as:  VOLTAREN Apply 2 g topically 4 (four) times daily.   diphenhydrAMINE 25 MG tablet Commonly known as:  BENADRYL Take 50 mg by  mouth at bedtime.   Ferrous Sulfate 90 (18 Fe) MG Tabs Take 1 tablet by mouth every morning.   furosemide 40 MG tablet Commonly known as:  LASIX Take 1 tablet (40 mg total) by mouth daily.   gabapentin 100 MG capsule Commonly known as:  NEURONTIN Take 100 mg by mouth 4 (four) times daily as needed.   HYDROcodone-acetaminophen 7.5-325 MG tablet Commonly known as:  NORCO Take 0.5-1 tablets by mouth every 6 (six) hours as needed for moderate pain.   ketoconazole 2 % cream Commonly known as:  NIZORAL Apply 1 application topically daily.   levothyroxine 100 MCG tablet Commonly known as:  SYNTHROID, LEVOTHROID Take 1 Tablet by mouth once daily BEFORE BREAKFAST   lovastatin 40 MG tablet Commonly known  as:  MEVACOR Take 1 tablet (40 mg total) by mouth at bedtime.   multivitamin tablet Take 1 tablet by mouth every morning.   mupirocin ointment 2 % Commonly known as:  BACTROBAN Apply 1 application topically 2 (two) times daily.   nystatin powder Commonly known as:  MYCOSTATIN/NYSTOP Apply topically 4 (four) times daily.   nystatin cream Commonly known as:  MYCOSTATIN Apply 1 application topically 2 (two) times daily.   omeprazole 40 MG capsule Commonly known as:  PRILOSEC Take 1 Capsule by mouth every morning   OVER THE COUNTER MEDICATION Stool softener   OVER THE COUNTER MEDICATION preservision   pyridOXINE 100 MG tablet Commonly known as:  VITAMIN B-6 Take 100 mg by mouth daily.   sertraline 100 MG tablet Commonly known as:  ZOLOFT Take 1.5 tablets (150 mg total) by mouth every morning.   sodium chloride 0.65 % Soln nasal spray Commonly known as:  OCEAN Place 1 spray into both nostrils as needed for congestion.   vitamin C 500 MG tablet Commonly known as:  ASCORBIC ACID Take 500 mg by mouth daily.   VITAMIN D-3 PO Take by mouth.   vitamin E 400 UNIT capsule Take 400 Units by mouth daily.          Objective:    BP (!) 151/85   Pulse 95   Temp 97.7 F (36.5 C) (Oral)   Ht 5\' 4"  (1.626 m)   Wt 173 lb 6.4 oz (78.7 kg)   BMI 29.76 kg/m   Wt Readings from Last 3 Encounters:  09/11/18 173 lb 6.4 oz (78.7 kg)  07/23/18 170 lb (77.1 kg)  06/13/18 179 lb 3.2 oz (81.3 kg)    Physical Exam  Constitutional: She is oriented to person, place, and time. She appears well-developed and well-nourished.  HENT:  Head: Atraumatic.  Cardiovascular: Normal rate, regular rhythm, normal heart sounds and intact distal pulses.  Pulmonary/Chest: Effort normal and breath sounds normal.  Abdominal: Soft. Bowel sounds are normal. There is no tenderness.  Musculoskeletal: Normal range of motion.       Left shoulder: She exhibits normal range of motion, no tenderness, no  swelling and normal strength.       Right knee: She exhibits normal range of motion and no swelling. No tenderness found.       Left knee: She exhibits normal range of motion and no swelling. No tenderness found.       Arms: Neurological: She is alert and oriented to person, place, and time.  Psychiatric: She has a normal mood and affect. Her behavior is normal.   Rib x-ray left: No signs of acute bony abnormalities     Assessment & Plan:   Problem List  Items Addressed This Visit    None    Visit Diagnoses    Fall, initial encounter    -  Primary   Relevant Orders   DG Ribs Unilateral W/Chest Left (Completed)   Rib contusion, left, initial encounter       Recommended using her Voltaren gel      Left rib contusion Patient reminded to take deep breaths at least every hour to prevent pneumonia. She was told she can use her Voltaren cream for pain relief as well as ibuprofen as needed. She was told to sleep on a pillow or use compression to help with the pain while sleeping.  Chest pain Follow up with PCP for further evaluation.  Likely related to GERD  Follow up plan: Return if symptoms worsen or fail to improve.  Counseling provided for all of the vaccine components Orders Placed This Encounter  Procedures  . DG Ribs Unilateral W/Chest Left    Caryl Pina, MD [facility] 09/17/2018, 9:47 PM

## 2018-09-29 ENCOUNTER — Encounter (HOSPITAL_COMMUNITY): Payer: Self-pay | Admitting: Emergency Medicine

## 2018-09-29 ENCOUNTER — Emergency Department (HOSPITAL_COMMUNITY): Payer: Medicare Other

## 2018-09-29 ENCOUNTER — Emergency Department (HOSPITAL_COMMUNITY)
Admission: EM | Admit: 2018-09-29 | Discharge: 2018-09-29 | Disposition: A | Discharge status: Home / Self Care | Payer: Medicare Other | Attending: Emergency Medicine | Admitting: Emergency Medicine

## 2018-09-29 ENCOUNTER — Other Ambulatory Visit: Payer: Self-pay

## 2018-09-29 DIAGNOSIS — Z7982 Long term (current) use of aspirin: Secondary | ICD-10-CM | POA: Diagnosis not present

## 2018-09-29 DIAGNOSIS — E785 Hyperlipidemia, unspecified: Secondary | ICD-10-CM | POA: Insufficient documentation

## 2018-09-29 DIAGNOSIS — K5730 Diverticulosis of large intestine without perforation or abscess without bleeding: Secondary | ICD-10-CM | POA: Diagnosis not present

## 2018-09-29 DIAGNOSIS — Y929 Unspecified place or not applicable: Secondary | ICD-10-CM | POA: Diagnosis not present

## 2018-09-29 DIAGNOSIS — S22080A Wedge compression fracture of T11-T12 vertebra, initial encounter for closed fracture: Secondary | ICD-10-CM | POA: Diagnosis not present

## 2018-09-29 DIAGNOSIS — I110 Hypertensive heart disease with heart failure: Secondary | ICD-10-CM | POA: Diagnosis not present

## 2018-09-29 DIAGNOSIS — E039 Hypothyroidism, unspecified: Secondary | ICD-10-CM | POA: Diagnosis not present

## 2018-09-29 DIAGNOSIS — W19XXXA Unspecified fall, initial encounter: Secondary | ICD-10-CM | POA: Diagnosis not present

## 2018-09-29 DIAGNOSIS — Y998 Other external cause status: Secondary | ICD-10-CM | POA: Diagnosis not present

## 2018-09-29 DIAGNOSIS — I5032 Chronic diastolic (congestive) heart failure: Secondary | ICD-10-CM | POA: Diagnosis not present

## 2018-09-29 DIAGNOSIS — S3992XA Unspecified injury of lower back, initial encounter: Secondary | ICD-10-CM | POA: Diagnosis present

## 2018-09-29 DIAGNOSIS — R109 Unspecified abdominal pain: Secondary | ICD-10-CM | POA: Diagnosis not present

## 2018-09-29 DIAGNOSIS — Y939 Activity, unspecified: Secondary | ICD-10-CM | POA: Diagnosis not present

## 2018-09-29 DIAGNOSIS — Z79899 Other long term (current) drug therapy: Secondary | ICD-10-CM | POA: Diagnosis not present

## 2018-09-29 LAB — CBC WITH DIFFERENTIAL/PLATELET
Basophils Absolute: 0 10*3/uL (ref 0.0–0.1)
Basophils Relative: 0 %
Eosinophils Absolute: 0.1 10*3/uL (ref 0.0–0.7)
Eosinophils Relative: 2 %
HCT: 39.6 % (ref 36.0–46.0)
Hemoglobin: 12.6 g/dL (ref 12.0–15.0)
Lymphocytes Relative: 32 %
Lymphs Abs: 1.8 10*3/uL (ref 0.7–4.0)
MCH: 29.8 pg (ref 26.0–34.0)
MCHC: 31.8 g/dL (ref 30.0–36.0)
MCV: 93.6 fL (ref 78.0–100.0)
Monocytes Absolute: 0.6 10*3/uL (ref 0.1–1.0)
Monocytes Relative: 12 %
Neutro Abs: 3 10*3/uL (ref 1.7–7.7)
Neutrophils Relative %: 54 %
Platelets: 261 10*3/uL (ref 150–400)
RBC: 4.23 MIL/uL (ref 3.87–5.11)
RDW: 13.1 % (ref 11.5–15.5)
WBC: 5.5 10*3/uL (ref 4.0–10.5)

## 2018-09-29 LAB — URINALYSIS, ROUTINE W REFLEX MICROSCOPIC
Bilirubin Urine: NEGATIVE
Glucose, UA: NEGATIVE mg/dL
Ketones, ur: NEGATIVE mg/dL
Nitrite: NEGATIVE
Protein, ur: NEGATIVE mg/dL
Specific Gravity, Urine: 1.006 (ref 1.005–1.030)
pH: 6 (ref 5.0–8.0)

## 2018-09-29 LAB — BASIC METABOLIC PANEL
Anion gap: 6 (ref 5–15)
BUN: 15 mg/dL (ref 8–23)
CO2: 30 mmol/L (ref 22–32)
Calcium: 9.5 mg/dL (ref 8.9–10.3)
Chloride: 100 mmol/L (ref 98–111)
Creatinine, Ser: 0.52 mg/dL (ref 0.44–1.00)
GFR calc Af Amer: >60 mL/min (ref >60)
GFR calc non Af Amer: >60 mL/min (ref >60)
Glucose, Bld: 117 mg/dL — ABNORMAL HIGH (ref 70–99)
Potassium: 3.6 mmol/L (ref 3.5–5.1)
Sodium: 136 mmol/L (ref 135–145)

## 2018-09-29 MED ORDER — HYDROCODONE-ACETAMINOPHEN 5-325 MG PO TABS
1.0000 | ORAL_TABLET | Freq: Once | ORAL | Status: AC
  Administered 2018-09-29: 1 via ORAL
  Filled 2018-09-29: qty 1

## 2018-09-29 MED ORDER — HYDROCODONE-ACETAMINOPHEN 5-325 MG PO TABS: 1.0000 | ORAL_TABLET | ORAL | 0 refills | Status: DC | PRN

## 2018-09-29 MED ORDER — HYDROCODONE-ACETAMINOPHEN 5-325 MG PO TABS: 1.0000 | ORAL_TABLET | Freq: Once | ORAL | Status: DC

## 2018-09-29 MED ORDER — IOPAMIDOL (ISOVUE-370) INJECTION 76%
100.0000 mL | Freq: Once | INTRAVENOUS | Status: AC | PRN
  Administered 2018-09-29: 100 mL via INTRAVENOUS

## 2018-09-29 NOTE — ED Triage Notes (Addendum)
Patient c/o lower back pain with intermittent sharp pain in right flank x1 week that is progressively getting worse. Denies any nausea, vomiting, diarrhea, urinary symptoms, known injury, or complications with BMs/urination. Patient states last BM this morning-normal, no blood noted.

## 2018-09-29 NOTE — ED Notes (Signed)
Repaged Vascular to Dr Orlena Sheldon @ [phone]

## 2018-09-29 NOTE — Discharge Instructions (Addendum)
You are evaluated in the emergency department for back pain.  You had lab work urinalysis and 2 CAT scans.  The primary finding is that you have a compression fracture of thoracic vertebra #12.  This will need to be followed up with your doctor and neurosurgery.  They also saw some inflammation on your aorta which we reviewed with vascular surgery and they did not think that this was a significant finding but offered that you could follow-up with them if your symptoms continue.  Please return if any worsening symptoms.

## 2018-09-29 NOTE — ED Triage Notes (Signed)
Patient now recalls falling last week in which she caught herself has her knees touched the ground.

## 2018-09-29 NOTE — ED Provider Notes (Signed)
[facility] Provider Note   CSN: [id number] Arrival date & time: 09/29/18  5027     History   Chief Complaint Chief Complaint  Patient presents with  . Back Pain    HPI Danielle Parrish is a 77 y.o. female.  She comes to the emergency department with complaints of mid and low back pain for about a week that has worsened over the last few days with a constant throbbing and then a sharp stabbing radiation around her right side to her abdomen.  It is not associated with any nausea vomiting diarrhea shortness of breath chest pain or urinary symptoms.  No numbness or weakness.  She had a minor fall a couple of weeks ago landing on her knees.  She is tried ibuprofen without relief.  The history is provided by the patient.  Back Pain   This is a new problem. The current episode started more than 1 week ago. The problem occurs constantly. The problem has not changed since onset.The pain is associated with falling. The pain is present in the lumbar spine. The quality of the pain is described as aching and shooting. Radiates to: r abdomen. The pain is moderate. The symptoms are aggravated by bending and twisting. Associated symptoms include abdominal pain. Pertinent negatives include no chest pain, no fever, no numbness, no bowel incontinence, no perianal numbness, no bladder incontinence, no dysuria, no leg pain, no paresthesias, no tingling and no weakness. She has tried NSAIDs for the symptoms. The treatment provided no relief.    Past Medical History:  Diagnosis Date  . Anxiety   . Arthritis   . CHF (congestive heart failure) ([place])   . Chronic pain of multiple joints   . Depression   . Essential hypertension   . GERD (gastroesophageal reflux disease)   . Hyperlipidemia   . Hypothyroidism   . Osteoporosis   . Sleep apnea    CPAP  . Vitamin D deficiency     Patient Active Problem List   Diagnosis Date Noted  . Primary osteoarthritis of right knee 07/20/2017  .  Meniscus degeneration, right 06/14/2017  . Varicose veins of left lower extremity with complications [id number]/11/8785  . Pain medication agreement signed 06/06/2016  . Gout attack 01/11/2016  . OSA (obstructive sleep apnea) 09/14/2015  . Chronic diastolic heart failure ([place]) 09/10/2015  . Orthopnea 09/02/2015  . Dysphagia, pharyngoesophageal phase 08/04/2015  . Anorexia 08/04/2015  . Fatigue 08/04/2015  . Malaise and fatigue 08/04/2015  . Depression 04/08/2015  . Osteoarthritis of left knee 09/30/2014  . Vitamin D deficiency   . Anxiety   . GERD (gastroesophageal reflux disease)   . Arthritis   . Chronic pain of multiple joints   . HTN (hypertension) 05/15/2013  . HLD (hyperlipidemia) 05/15/2013  . Pre-diabetes 05/15/2013  . Obesity 05/15/2013  . Hypothyroidism 05/15/2013    Past Surgical History:  Procedure Laterality Date  . ABDOMINAL HERNIA REPAIR    . ABDOMINAL HYSTERECTOMY  1977  . BREAST EXCISIONAL BIOPSY    . KNEE ARTHROSCOPY Left 09/30/2014   Procedure: LEFT KNEE ARTHROSCOPY MEDIAL MENISECTOMY ABRASION CHONDROPLASTY SYNOVECTOMY SUPRAPATELLER CUFF;  Surgeon: Tobi Bastos, MD;  Location: [facility];  Service: Orthopedics;  Laterality: Left;     OB History   None      Home Medications    Prior to Admission medications   Medication Sig Start Date End Date Taking? Authorizing Provider  amLODipine (NORVASC) 10 MG tablet TAKE 1 TABLET BY MOUTH EVERY  DAY 06/10/18   Timmothy Euler, MD  aspirin 81 MG chewable tablet Chew 3 tablets by mouth daily.    [provider]  Biotin 10 MG CAPS Take by mouth.    [provider]  Cholecalciferol (VITAMIN D-3 PO) Take by mouth.    [provider]  diclofenac sodium (VOLTAREN) 1 % GEL Apply 2 g topically 4 (four) times daily. 03/31/17   Eustaquio Maize, MD  diphenhydrAMINE (BENADRYL) 25 MG tablet Take 50 mg by mouth at bedtime.    [provider]  Ferrous Sulfate 90 (18 FE) MG TABS Take 1 tablet by  mouth every morning. 01/07/15   Hassell Done Mary-Margaret, FNP  furosemide (LASIX) 40 MG tablet Take 1 tablet (40 mg total) by mouth daily. 02/21/18   Timmothy Euler, MD  gabapentin (NEURONTIN) 100 MG capsule Take 100 mg by mouth 4 (four) times daily as needed.    [provider]  HYDROcodone-acetaminophen (NORCO) 7.5-325 MG tablet Take 0.5-1 tablets by mouth every 6 (six) hours as needed for moderate pain. 06/13/18   Terald Sleeper, PA-C  ketoconazole (NIZORAL) 2 % cream Apply 1 application topically daily. 06/13/18   Timmothy Euler, MD  levothyroxine (SYNTHROID, LEVOTHROID) 100 MCG tablet Take 1 Tablet by mouth once daily BEFORE BREAKFAST 03/05/18   Timmothy Euler, MD  lovastatin (MEVACOR) 40 MG tablet Take 1 tablet (40 mg total) by mouth at bedtime. 05/24/17   Timmothy Euler, MD  Multiple Vitamin (MULTIVITAMIN) tablet Take 1 tablet by mouth every morning.     [provider]  mupirocin ointment (BACTROBAN) 2 % Apply 1 application topically 2 (two) times daily. 01/17/18   Timmothy Euler, MD  nystatin (MYCOSTATIN/NYSTOP) powder Apply topically 4 (four) times daily. 04/10/18   Timmothy Euler, MD  nystatin cream (MYCOSTATIN) Apply 1 application topically 2 (two) times daily. 04/10/18   Timmothy Euler, MD  omeprazole (PRILOSEC) 40 MG capsule Take 1 Capsule by mouth every morning 08/21/17   Timmothy Euler, MD  OVER THE COUNTER MEDICATION Stool softener    [provider]  OVER THE COUNTER MEDICATION preservision    [provider]  pyridOXINE (VITAMIN B-6) 100 MG tablet Take 100 mg by mouth daily.    [provider]  sertraline (ZOLOFT) 100 MG tablet Take 1.5 tablets (150 mg total) by mouth every morning. 06/13/18   Timmothy Euler, MD  sodium chloride (OCEAN) 0.65 % SOLN nasal spray Place 1 spray into both nostrils as needed for congestion. 05/24/17   Timmothy Euler, MD  vitamin C (ASCORBIC ACID) 500 MG tablet Take 500 mg by mouth  daily.    [provider]  vitamin E 400 UNIT capsule Take 400 Units by mouth daily.    [provider]    Family History Family History  Problem Relation Age of Onset  . Hypertension Mother   . Stroke Mother   . Cancer Father     Social History Social History   Tobacco Use  . Smoking status: Never Smoker  . Smokeless tobacco: Never Used  Substance Use Topics  . Alcohol use: No  . Drug use: No     Allergies   Crestor [rosuvastatin]; Nsaids; Penicillins; Vytorin [ezetimibe-simvastatin]; and Tolmetin   Review of Systems Review of Systems  Constitutional: Negative for fever.  HENT: Negative for sore throat.   Eyes: Negative for visual disturbance.  Respiratory: Negative for shortness of breath.   Cardiovascular: Negative for chest  pain.  Gastrointestinal: Positive for abdominal pain. Negative for bowel incontinence.  Genitourinary: Negative for bladder incontinence and dysuria.  Musculoskeletal: Positive for back pain. Negative for neck pain.  Skin: Negative for rash.  Neurological: Negative for tingling, weakness, numbness and paresthesias.     Physical Exam Updated Vital Signs BP (!) 163/84   Pulse 70   Temp 97.8 F (36.6 C) (Oral)   Resp 18   Ht 5\' 3"  (1.6 m)   Wt 83.9 kg   SpO2 95%   BMI 32.77 kg/m   Physical Exam  Constitutional: She appears well-developed and well-nourished. No distress.  HENT:  Head: Normocephalic and atraumatic.  Eyes: Conjunctivae are normal.  Neck: Neck supple.  Cardiovascular: Normal rate and regular rhythm.  No murmur heard. Pulmonary/Chest: Effort normal and breath sounds normal. No respiratory distress.  Abdominal: Soft. There is no tenderness.  Musculoskeletal: She exhibits no edema or deformity.  She has some diffuse tenderness in her lower thoracic and lumbar spine with some paravertebral tenderness also.  Full range of motion of her extremities.  No numbness no weakness.  Neurological: She is alert.  She has normal strength. No sensory deficit. Gait normal. GCS eye subscore is 4. GCS verbal subscore is 5. GCS motor subscore is 6.  Skin: Skin is warm and dry.  Psychiatric: She has a normal mood and affect.  Nursing note and vitals reviewed.    ED Treatments / Results  Labs (all labs ordered are listed, but only abnormal results are displayed) Labs Reviewed  BASIC METABOLIC PANEL - Abnormal; Notable for the following components:      Result Value   Glucose, Bld 117 (*)    All other components within normal limits  URINALYSIS, ROUTINE W REFLEX MICROSCOPIC - Abnormal; Notable for the following components:   Color, Urine STRAW (*)    Hgb urine dipstick SMALL (*)    Leukocytes, UA SMALL (*)    Bacteria, UA RARE (*)    All other components within normal limits  CBC WITH DIFFERENTIAL/PLATELET    EKG None  Radiology Ct Renal Stone Study  Addendum Date: 09/29/2018   ADDENDUM REPORT: 09/29/2018 12:33 ADDENDUM: T12 vertebral body fracture without loss of height, posterior element involvement, or retropulsion, new since 09/01/2015. If this corresponds to the level of patient's pain and intervention is considered, MR lumbar spine suggested for further characterization. Electronically Signed   By: Lucrezia Europe M.D.   On: 09/29/2018 12:33   Result Date: 09/29/2018 CLINICAL DATA:  Pt c/o right sided lower back and flank pain x 1 week. Denies n/v, dysuria. EXAM: CT ABDOMEN AND PELVIS WITHOUT CONTRAST TECHNIQUE: Multidetector CT imaging of the abdomen and pelvis was performed following the standard protocol without IV contrast. COMPARISON:  08/31/2025 FINDINGS: Lower chest: No pleural or pericardial effusion. Visualized lung bases clear. Hepatobiliary: No focal liver abnormality is seen. No gallstones, gallbladder wall thickening, or biliary dilatation. Pancreas: Unremarkable. No pancreatic ductal dilatation or surrounding inflammatory changes. Spleen: Normal in size without focal abnormality.  Adrenals/Urinary Tract: 1 cm probable cyst, mid right kidney, incompletely characterized. No hydronephrosis or urolithiasis. Normal adrenals. Urinary bladder incompletely distended. Stomach/Bowel: Stomach and small bowel are nondistended. Appendix not discretely identified. No pericecal inflammatory/edematous change however. Moderate proximal colonic fecal material without dilatation. Scattered sigmoid diverticula without adjacent inflammatory/edematous change or abscess. Vascular/Lymphatic: Moderate aortoiliac atherosclerosis (ICD10-170.0) without aneurysm. Inflammatory/edematous changes around the mid abdominal aorta, new since previous. A few prominent left para-aortic and aortocaval nodes, none greater than 1 cm  short axis diameter. Reproductive: Status post hysterectomy. No adnexal masses. Other: No ascites.  No free air. Musculoskeletal: Multilevel spondylitic change in the lower thoracic and lumbar spine. Negative for fracture or worrisome bone lesion. Bilateral sacroiliitis. IMPRESSION: 1. New periaortic inflammatory/edematous change and regional borderline retroperitoneal adenopathy. Findings are nonspecific, may represent aortitis, other vasculitides. Consider CTA abdomen/pelvis to further characterize, rule out penetrating atheromatous ulcer. 2. Sigmoid diverticulosis. Electronically Signed: By: Lucrezia Europe M.D. On: 09/29/2018 10:58   Ct Angio Abd/pel W/ And/or W/o  Result Date: 09/29/2018 CLINICAL DATA:  Pt had renal stone study earlier today; new periaortic inflammatory/edematous change and regional borderline retroperitoneal adenopathy. EXAM: CTA ABDOMEN AND PELVIS WITH CONTRAST TECHNIQUE: Multidetector CT imaging of the abdomen and pelvis was performed using the standard protocol during bolus administration of intravenous contrast. Multiplanar reconstructed images and MIPs were obtained and reviewed to evaluate the vascular anatomy. CONTRAST:  183mL ISOVUE-370 IOPAMIDOL (ISOVUE-370) INJECTION 76%  COMPARISON:  Noncontrast study from earlier the same day, CT 05/01/2015 FINDINGS: VASCULAR Aorta: Moderate calcified atheromatous plaque particularly in the infrarenal segment. No dissection, aneurysm, penetrating atheromatous ulcer, or stenosis. Periaortic inflammatory/edematous change in the retroperitoneum extending from the level of the renal arteries to the level of the IMA origin. Celiac: Calcified ostial plaque resulting in short segment stenosis of possible hemodynamic significance, unremarkable distally. SMA: Partially calcified ostial plaque resulting in short segment stenosis of doubtful hemodynamic significance, unremarkable distally. Renals: Single right, widely patent. Duplicated left. The superior is dominant. Both show short segment narrowing just beyond their origins corresponding to the level of periaortic inflammatory/edematous change, new since most recent prior study 09/01/2015. both are patent distally. IMA: Patent without evidence of aneurysm, dissection, vasculitis or significant stenosis. Inflow: Mild scattered nonocclusive calcified plaque in bilateral common and internal iliac arteries. External iliac arteries unremarkable. Proximal Outflow: Bilateral common femoral and visualized portions of the superficial and profunda femoral arteries are patent without evidence of aneurysm, dissection, vasculitis or significant stenosis. Veins: No obvious venous abnormality within the limitations of this arterial phase study. Review of the MIP images confirms the above findings. NON-VASCULAR Lower chest: No acute abnormality. Hepatobiliary: No focal liver abnormality is seen. No gallstones, gallbladder wall thickening, or biliary dilatation. Pancreas: Unremarkable. No pancreatic ductal dilatation or surrounding inflammatory changes. Spleen: Normal in size without focal abnormality. Adrenals/Urinary Tract: Normal adrenals. Unremarkable left kidney. 1.3 cm lower pole right renal cyst. No hydronephrosis.  Urinary bladder physiologically distended. Stomach/Bowel: Stomach decompressed. Small bowel nondilated normal appendix. Colon is nondilated. Scattered sigmoid diverticula without adjacent inflammatory/edematous change or abscess. Lymphatic: Shotty subcentimeter left para-aortic and aortocaval nodes evident within the periaortic inflammatory/edematous change. No other abdominal or pelvic adenopathy. Reproductive: Status post hysterectomy. No adnexal masses. Other: No ascites.  No free air. Musculoskeletal: Fracture in the axial plane involving anterior and midportion of the T12 vertebral body, new since 09/01/2015. No evidence of involvement of the posterior cortex of the vertebral body. No retropulsion. There is no compression deformity or significant displacement. There is no evident involvement of posterior elements. Multilevel degenerative change in the lower thoracic and lumbar spine. IMPRESSION: VASCULAR 1. New segmental Periaortitis and regional retroperitoneal adenopathy, with probable involvement and narrowing of left renal arteries. Correlate with other clinical or laboratory findings of vasculitis. Early retroperitoneal fibrosis could have a similar appearance. NON-VASCULAR 1. T12 vertebral body fracture without loss of height, posterior element involvement, or retropulsion, new since 09/01/2015. If this corresponds to the level of patient's pain and intervention is considered, MR lumbar  spine suggested for further characterization. 2. Sigmoid diverticulosis. Electronically Signed   By: Lucrezia Europe M.D.   On: 09/29/2018 12:32    Procedures Procedures (including critical care time)  Medications Ordered in ED Medications  HYDROcodone-acetaminophen (NORCO/VICODIN) 5-325 MG per tablet 1 tablet (1 tablet Oral Given 09/29/18 0942)  HYDROcodone-acetaminophen (NORCO/VICODIN) 5-325 MG per tablet 1 tablet (1 tablet Oral Given 09/29/18 1106)  iopamidol (ISOVUE-370) 76 % injection 100 mL (100 mLs Intravenous  Contrast Given 09/29/18 1143)     Initial Impression / Assessment and Plan / ED Course  I have reviewed the triage vital signs and the nursing notes.  Pertinent labs & imaging results that were available during my care of the patient were reviewed by me and considered in my medical decision making (see chart for details).  Clinical Course as of Sep 29 1828  Sun Sep 29, 2018  1110 Patient CT imaging abnormal and that radiology see some peri-aortic inflammatory changes.  They recommend a CTA abdomen and pelvis.  I reviewed this with the patient and she is agreeable to repeat imaging.   [MB]  1254 Repeat CT shows a T12 compression fracture new from 2016.  It also shows some periaortic inflammatory changes.  Of updated the patient on the results and have put a call into vascular to review this with them   [MB]  1258 Aorta: Moderate calcified atheromatous plaque particularly in the infrarenal segment. No dissection, aneurysm, penetrating atheromatous ulcer, or stenosis. Periaortic inflammatory/edematous change in the retroperitoneum extending from the level of the renal arteries to the level of the IMA origin.     [MB]  1346 Discussed with Dr. Doren Custard from vascular surgery who we viewed her scans and felt that it probably is not the cause of her symptoms.  He offered that she can follow-up with him in the office if her symptoms continue.   [MB]    Clinical Course User Index [MB] Hayden Rasmussen, MD     Final Clinical Impressions(s) / ED Diagnoses   Final diagnoses:  Compression fracture of T12 vertebra, initial encounter [facility])    ED Discharge Orders         Ordered    HYDROcodone-acetaminophen (NORCO/VICODIN) 5-325 MG tablet  Every 4 hours PRN     09/29/18 1354           Hayden Rasmussen, MD 09/29/18 1830

## 2018-10-01 ENCOUNTER — Ambulatory Visit (INDEPENDENT_AMBULATORY_CARE_PROVIDER_SITE_OTHER): Payer: Medicare Other | Admitting: Family Medicine

## 2018-10-01 ENCOUNTER — Encounter: Payer: Self-pay | Admitting: Family Medicine

## 2018-10-01 VITALS — BP 175/88 | HR 81 | Temp 97.3°F | Ht 63.0 in | Wt 175.0 lb

## 2018-10-01 DIAGNOSIS — Z09 Encounter for follow-up examination after completed treatment for conditions other than malignant neoplasm: Secondary | ICD-10-CM

## 2018-10-01 DIAGNOSIS — I700 Atherosclerosis of aorta: Secondary | ICD-10-CM | POA: Diagnosis not present

## 2018-10-01 DIAGNOSIS — Z23 Encounter for immunization: Secondary | ICD-10-CM | POA: Diagnosis not present

## 2018-10-01 DIAGNOSIS — S22080A Wedge compression fracture of T11-T12 vertebra, initial encounter for closed fracture: Secondary | ICD-10-CM

## 2018-10-01 NOTE — Progress Notes (Addendum)
Subjective:    Patient ID: Danielle Parrish, female    DOB: 08/07/1941, 77 y.o.   MRN: [id number]  Chief Complaint:  ER followup, compression fracture and RLQ pain   HPI: Danielle Parrish is a 77 y.o. female presenting on 10/01/2018 for ER followup, compression fracture and RLQ pain  Pt presents today for follow up from an emergency room visit on 09/29/18. Pt went to the ED for back pain. Scans completed during this visit revealed a compression fracture of T12 and peri-aortic inflammation. Pts case was discussed with Dr. Doren Custard, vascular surgery. Pt was to schedule follow up appointment with Dr. Doren Custard if RLQ pain persisted. Pt was also provided a referral to neurosurgery, she has not made an appointment. Pt states her pain is in her mid to lower back and is sharp. States the pain is worse after activity. States it is controlled with the hydrocodone. States she had to work last night and was unable to take her medications. She did not take any over the counter pain medications during her shift at work. She denies nausea, vomiting, diarrhea, constipation, bowel or bladder incontinence, numbness, weakness, or loss of function.   Relevant past medical, surgical, family, and social history reviewed and updated as indicated.  Allergies and medications reviewed and updated.   Past Medical History:  Diagnosis Date  . Anxiety   . Arthritis   . CHF (congestive heart failure) ([place])   . Chronic pain of multiple joints   . Depression   . Essential hypertension   . GERD (gastroesophageal reflux disease)   . Hyperlipidemia   . Hypothyroidism   . Osteoporosis   . Sleep apnea    CPAP  . Vitamin D deficiency     Past Surgical History:  Procedure Laterality Date  . ABDOMINAL HERNIA REPAIR    . ABDOMINAL HYSTERECTOMY  1977  . BREAST EXCISIONAL BIOPSY    . KNEE ARTHROSCOPY Left 09/30/2014   Procedure: LEFT KNEE ARTHROSCOPY MEDIAL MENISECTOMY ABRASION CHONDROPLASTY SYNOVECTOMY SUPRAPATELLER CUFF;   Surgeon: Tobi Bastos, MD;  Location: [facility];  Service: Orthopedics;  Laterality: Left;    Social History   Socioeconomic History  . Marital status: Widowed    Spouse name: Laverna Peace  . Number of children: 2  . Years of education: Not on file  . Highest education level: 12th grade  Occupational History  . Occupation: CNA    Comment: [employer]  . Financial resource strain: Very hard  . Food insecurity:    Worry: Sometimes true    Inability: Sometimes true  . Transportation needs:    Medical: No    Non-medical: No  Tobacco Use  . Smoking status: Never Smoker  . Smokeless tobacco: Never Used  Substance and Sexual Activity  . Alcohol use: No  . Drug use: No  . Sexual activity: Not Currently  Lifestyle  . Physical activity:    Days per week: 0 days    Minutes per session: 0 min  . Stress: Not at all  Relationships  . Social connections:    Talks on phone: More than three times a week    Gets together: More than three times a week    Attends religious service: Never    Active member of club or organization: No    Attends meetings of clubs or organizations: Never    Relationship status: Widowed  . Intimate partner violence:    Fear of current or ex partner: No  Emotionally abused: No    Physically abused: No    Forced sexual activity: No  Other Topics Concern  . Not on file  Social History Narrative  . Not on file    Outpatient Encounter Medications as of 10/01/2018  Medication Sig  . amLODipine (NORVASC) 10 MG tablet TAKE 1 TABLET BY MOUTH EVERY DAY (Patient taking differently: Take 10 mg by mouth daily. )  . aspirin 81 MG chewable tablet Chew 4 tablets by mouth daily.   . Biotin 10 MG CAPS Take 1 capsule by mouth daily.   . Cholecalciferol (VITAMIN D-3 PO) Take 1 capsule by mouth daily.   . diclofenac sodium (VOLTAREN) 1 % GEL Apply 2 g topically 4 (four) times daily.  . diphenhydrAMINE (BENADRYL) 25 MG tablet Take 50 mg by mouth at bedtime as  needed for allergies.   . Ferrous Sulfate 90 (18 FE) MG TABS Take 1 tablet by mouth every morning.  . gabapentin (NEURONTIN) 100 MG capsule Take 100 mg by mouth 4 (four) times daily as needed.  Marland Kitchen HYDROcodone-acetaminophen (NORCO/VICODIN) 5-325 MG tablet Take 1-2 tablets by mouth every 4 (four) hours as needed for severe pain.  Marland Kitchen ketoconazole (NIZORAL) 2 % cream Apply 1 application topically daily.  Marland Kitchen levothyroxine (SYNTHROID, LEVOTHROID) 100 MCG tablet Take 1 Tablet by mouth once daily BEFORE BREAKFAST (Patient taking differently: Take 100 mcg by mouth daily before breakfast. Take 1 Tablet by mouth once daily BEFORE BREAKFAST)  . lovastatin (MEVACOR) 40 MG tablet Take 1 tablet (40 mg total) by mouth at bedtime.  . Multiple Vitamins-Minerals (MULTIVITAMIN ADULT) CHEW Chew 2 tablets by mouth daily.  . mupirocin ointment (BACTROBAN) 2 % Apply 1 application topically 2 (two) times daily.  Marland Kitchen nystatin (MYCOSTATIN/NYSTOP) powder Apply topically 4 (four) times daily.  Marland Kitchen nystatin cream (MYCOSTATIN) Apply 1 application topically 2 (two) times daily.  Marland Kitchen omeprazole (PRILOSEC) 40 MG capsule Take 1 Capsule by mouth every morning (Patient taking differently: Take 40 mg by mouth daily as needed. )  . pyridOXINE (VITAMIN B-6) 100 MG tablet Take 100 mg by mouth daily.  . sertraline (ZOLOFT) 100 MG tablet Take 1.5 tablets (150 mg total) by mouth every morning. (Patient taking differently: Take 100 mg by mouth 2 (two) times daily. )  . sodium chloride (OCEAN) 0.65 % SOLN nasal spray Place 1 spray into both nostrils as needed for congestion.   No facility-administered encounter medications on file as of 10/01/2018.     Allergies  Allergen Reactions  . Crestor [Rosuvastatin] Other (See Comments)    Body aches.   . Nsaids     Burns stomach.   . Penicillins Hives    Has patient had a PCN reaction causing immediate rash, facial/tongue/throat swelling, SOB or lightheadedness with hypotension: Yes Has patient had a  PCN reaction causing severe rash involving mucus membranes or skin necrosis: unknown Has patient had a PCN reaction that required hospitalization No Has patient had a PCN reaction occurring within the last 10 years: No If all of the above answers are "NO", then may proceed with Cephalosporin use.   . Vytorin [Ezetimibe-Simvastatin]     Body aches.   . Tolmetin Nausea Only    Burns stomach.     Review of Systems  Constitutional: Negative for appetite change, chills, fatigue and fever.  Respiratory: Negative for cough, chest tightness and shortness of breath.   Cardiovascular: Negative for chest pain, palpitations and leg swelling.  Gastrointestinal: Positive for abdominal pain (RLQ - intermittent sharp  shooting pain). Negative for abdominal distention, anal bleeding, blood in stool, constipation, diarrhea, nausea, rectal pain and vomiting.  Genitourinary: Negative for difficulty urinating, dysuria, flank pain, frequency and urgency.  Musculoskeletal: Positive for back pain (mid to lower back). Negative for arthralgias, myalgias, neck pain and neck stiffness.  Neurological: Negative for dizziness, seizures, weakness, light-headedness, numbness and headaches.  All other systems reviewed and are negative.       Objective:    BP (!) 175/88   Pulse 81   Temp (!) 97.3 F (36.3 C) (Oral)   Ht 5\' 3"  (1.6 m)   Wt 175 lb (79.4 kg)   BMI 31.00 kg/m    Wt Readings from Last 3 Encounters:  10/01/18 175 lb (79.4 kg)  09/29/18 185 lb (83.9 kg)  09/11/18 173 lb 6.4 oz (78.7 kg)    Physical Exam  Constitutional: She appears well-developed and well-nourished. No distress.  HENT:  Head: Normocephalic.  Cardiovascular: Normal rate, regular rhythm, normal heart sounds and intact distal pulses.  Pulmonary/Chest: Effort normal and breath sounds normal. No respiratory distress.  Abdominal: Soft. Bowel sounds are normal. She exhibits no distension and no mass. There is no tenderness. There is no  rebound and no guarding. No hernia.  Musculoskeletal:       Lumbar back: She exhibits decreased range of motion, tenderness and pain. She exhibits no swelling, no edema, no deformity, no laceration, no spasm and normal pulse.       Back:  Skin: Skin is warm and dry. Capillary refill takes less than 2 seconds.  Psychiatric: She has a normal mood and affect. Her behavior is normal. Judgment and thought content normal.  Nursing note and vitals reviewed.   Results for orders placed or performed during the hospital encounter of 16/10/96  Basic metabolic panel  Result Value Ref Range   Sodium 136 135 - 145 mmol/L   Potassium 3.6 3.5 - 5.1 mmol/L   Chloride 100 98 - 111 mmol/L   CO2 30 22 - 32 mmol/L   Glucose, Bld 117 (H) 70 - 99 mg/dL   BUN 15 8 - 23 mg/dL   Creatinine, Ser 0.52 0.44 - 1.00 mg/dL   Calcium 9.5 8.9 - 10.3 mg/dL   GFR calc non Af Amer >60 >60 mL/min   GFR calc Af Amer >60 >60 mL/min   Anion gap 6 5 - 15  CBC with Differential  Result Value Ref Range   WBC 5.5 4.0 - 10.5 K/uL   RBC 4.23 3.87 - 5.11 MIL/uL   Hemoglobin 12.6 12.0 - 15.0 g/dL   HCT 39.6 36.0 - 46.0 %   MCV 93.6 78.0 - 100.0 fL   MCH 29.8 26.0 - 34.0 pg   MCHC 31.8 30.0 - 36.0 g/dL   RDW 13.1 11.5 - 15.5 %   Platelets 261 150 - 400 K/uL   Neutrophils Relative % 54 %   Neutro Abs 3.0 1.7 - 7.7 K/uL   Lymphocytes Relative 32 %   Lymphs Abs 1.8 0.7 - 4.0 K/uL   Monocytes Relative 12 %   Monocytes Absolute 0.6 0.1 - 1.0 K/uL   Eosinophils Relative 2 %   Eosinophils Absolute 0.1 0.0 - 0.7 K/uL   Basophils Relative 0 %   Basophils Absolute 0.0 0.0 - 0.1 K/uL  Urinalysis, Routine w reflex microscopic  Result Value Ref Range   Color, Urine STRAW (A) YELLOW   APPearance CLEAR CLEAR   Specific Gravity, Urine 1.006 1.005 - 1.030  pH 6.0 5.0 - 8.0   Glucose, UA NEGATIVE NEGATIVE mg/dL   Hgb urine dipstick SMALL (A) NEGATIVE   Bilirubin Urine NEGATIVE NEGATIVE   Ketones, ur NEGATIVE NEGATIVE mg/dL    Protein, ur NEGATIVE NEGATIVE mg/dL   Nitrite NEGATIVE NEGATIVE   Leukocytes, UA SMALL (A) NEGATIVE   RBC / HPF 0-5 0 - 5 RBC/hpf   WBC, UA 6-10 0 - 5 WBC/hpf   Bacteria, UA RARE (A) NONE SEEN   Squamous Epithelial / LPF 0-5 0 - 5   Mucus PRESENT        Pertinent labs & imaging results that were available during my care of the patient were reviewed by me and considered in my medical decision making.  Assessment & Plan:  Jenesys was seen today for er followup, compression fracture and rlq pain.  Diagnoses and all orders for this visit:  Hospital discharge follow-up  Compression fracture of T12 vertebra, initial encounter ([place]) No new symptoms. Pt continues to have mid to low back pain and RLQ pain.  Pt to call neurosurgery and vascular surgery for follow up appointments as discussed in the ED. Pt to continue medications as prescribed in the ED.    Encounter for immunization.   Continue all other maintenance medications.  Follow up plan: Return if symptoms worsen or fail to improve.  Educational handout given for compression fracture  The above assessment and manag ement plan was discussed with the patient. The patient verbalized understanding of and has agreed to the management plan. Patient is aware to call the clinic if symptoms persist or worsen. Patient is aware when to return to the clinic for a follow-up visit. Patient educated on when it is appropriate to go to the emergency department.   Monia Pouch, FNP-C [facility] [phone]

## 2018-10-01 NOTE — Patient Instructions (Addendum)
Follow up with neurosurgery and Dr. Doren Custard as discussed when discharged from the emergency department   Spinal Compression Fracture A spinal compression fracture is a collapse of the bones that form the spine (vertebrae). With this type of fracture, the vertebrae become squashed (compressed) into a wedge shape. Most compression fractures happen in the middle or lower part of the spine. What are the causes? This condition may be caused by:  Thinning and loss of density in the bones (osteoporosis). This is the most common cause.  A fall.  A car or motorcycle accident.  Cancer.  Trauma, such as a heavy, direct hit to the head.  What increases the risk? You may be at greater risk for a spinal compression fracture if you:  Are 60 years old or older.  Have osteoporosis.  Have certain types of cancer, including: ? Multiple myeloma. ? Lymphoma. ? Prostate cancer. ? Lung cancer. ? Breast cancer.  What are the signs or symptoms? Symptoms of this condition include:  Severe pain.  Pain that gets worse over time.  Pain that is worse when you stand, walk, sit, or bend.  Sudden pain that is so bad that it is hard for you to move.  Bending or humping of the spine.  Gradual loss of height.  Numbness, tingling, or weakness in the back and legs.  Trouble walking.  Your symptoms will depend on the cause of the fracture and how quickly it develops. For example, fractures that are caused by osteoporosis can cause few symptoms, no symptoms, or symptoms that develop slowly over time. How is this diagnosed? This condition may be diagnosed based on symptoms, medical history, and a physical exam. During the physical exam, your health care provider may tap along the length of your spine to check for tenderness. Tests may be done to confirm the diagnosis. They may include:  A bone density test to check for osteoporosis.  Imaging tests, such as a spine X-ray, a CT scan, or MRI.  How is  this treated? Treatment for this condition depends on the cause and severity of the condition.Some fractures, such as those that are caused by osteoporosis, may heal on their own with supportive care. This may include:  Pain medicine.  Rest.  A back brace.  Physical therapy exercises.  Medicine that reduces bone pain.  Calcium and vitamin D supplements.  Fractures that cause the back to become misshapen, cause nerve pain or weakness, or do not respond to other treatment may be treated with a surgical procedure, such as:  Vertebroplasty. In this procedure, bone cement is injected into the collapsed vertebrae to stabilize them.  Balloon kyphoplasty. In this procedure, the collapsed vertebrae are expanded with a balloon and then bone cement is injected into them.  Spinal fusion. In this procedure, the collapsed vertebrae are connected (fused) to normal vertebrae.  Follow these instructions at home: General instructions  Take medicines only as directed by your health care provider.  Do not drive or operate heavy machinery while taking pain medicine.  If directed, apply ice to the injured area: ? Put ice in a plastic bag. ? Place a towel between your skin and the bag. ? Leave the ice on for 30 minutes every two hours at first. Then apply the ice as needed.  Wear your neck brace or back brace as directed by your health care provider.  Do not drink alcohol. Alcohol can interfere with your treatment.  Keep all follow-up visits as directed by your health  care provider. This is important. It can help to prevent permanent injury, disability, and long-lasting (chronic) pain. Activity  Stay in bed (on bed rest) only as directed by your health care provider. Being on bed rest for too long can make your condition worse.  Return to your normal activities as directed by your health care provider. Ask what activities are safe for you.  Do exercises to improve motion and strength in your  back (physical therapy), as recommended by your health care provider.  Exercise regularly as directed by your health care provider. Contact a health care provider if:  You have a fever.  You develop a cough that makes your pain worse.  Your pain medicine is not helping.  Your pain does not get better over time.  You cannot return to your normal activities as planned or expected. Get help right away if:  Your pain is very bad and it suddenly gets worse.  You are unable to move any body part (paralysis) that is below the level of your injury.  You have numbness, tingling, or weakness in any body part that is below the level of your injury.  You cannot control your bladder or bowels. This information is not intended to replace advice given to you by your health care provider. Make sure you discuss any questions you have with your health care provider. Document Released: 12/11/2005 Document Revised: 08/08/2016 Document Reviewed: 12/15/2014 Elsevier Interactive Patient Education  [employer].

## 2018-10-02 ENCOUNTER — Telehealth: Payer: Self-pay | Admitting: Family Medicine

## 2018-10-02 DIAGNOSIS — I700 Atherosclerosis of aorta: Secondary | ICD-10-CM | POA: Insufficient documentation

## 2018-10-02 NOTE — Telephone Encounter (Signed)
I will put the referral in. She had a referral from the ED for Dr. Doren Custard, I will place this again.

## 2018-10-02 NOTE — Telephone Encounter (Signed)
Patient aware.

## 2018-10-02 NOTE — Telephone Encounter (Signed)
Patient seen Danielle Parrish yesterday- patient states she needs a referral to cardiologist. Please advise

## 2018-10-02 NOTE — Addendum Note (Signed)
Addended by: Baruch Gouty on: 10/02/2018 02:59 PM   Modules accepted: Orders

## 2018-10-02 NOTE — Telephone Encounter (Signed)
PT states that she called cardiologist [facility] to make an apt they told her that we would need to call and send records to them so they could schedule. That offices fax number is [phone].  Pt is really wanting to get an apt there tomorrow 10/10 bc she has another dr apt at another office near there.

## 2018-10-03 DIAGNOSIS — S22088A Other fracture of T11-T12 vertebra, initial encounter for closed fracture: Secondary | ICD-10-CM | POA: Diagnosis not present

## 2018-10-04 ENCOUNTER — Telehealth: Payer: Self-pay | Admitting: Family Medicine

## 2018-10-04 DIAGNOSIS — S22088A Other fracture of T11-T12 vertebra, initial encounter for closed fracture: Secondary | ICD-10-CM | POA: Diagnosis not present

## 2018-10-04 NOTE — Telephone Encounter (Signed)
Changed to [facility]

## 2018-10-04 NOTE — Telephone Encounter (Signed)
Patient wants cardiology referral for [facility] in [place]. Please advise

## 2018-10-07 ENCOUNTER — Telehealth: Payer: Self-pay

## 2018-10-07 NOTE — Telephone Encounter (Signed)
It does not look like we initially placed this referral, looks like it was placed by the emergency department, if she would like [place] to we can call up to [facility] and see if they can get her in quicker but without seeing her I do not have an exact diagnosis to tell them into an urgent one so I would not initially but it is urgent but just give them a call and see if they can fit her in sooner than [facility]

## 2018-10-07 NOTE — Telephone Encounter (Signed)
Calling that she needs to see a cardiologist ASAP because she is hurting in her side.  Wants to go to [facility]  She says she has an appt in [place] at cardiology but not till Nov. And needs sooner.

## 2018-10-24 ENCOUNTER — Telehealth: Payer: Self-pay | Admitting: Family Medicine

## 2018-10-28 ENCOUNTER — Other Ambulatory Visit: Payer: Self-pay | Admitting: Family Medicine

## 2018-10-28 ENCOUNTER — Ambulatory Visit (INDEPENDENT_AMBULATORY_CARE_PROVIDER_SITE_OTHER): Payer: Medicare Other

## 2018-10-28 ENCOUNTER — Telehealth: Payer: Self-pay | Admitting: Family Medicine

## 2018-10-28 DIAGNOSIS — Z780 Asymptomatic menopausal state: Secondary | ICD-10-CM

## 2018-10-28 DIAGNOSIS — M8080XS Other osteoporosis with current pathological fracture, unspecified site, sequela: Secondary | ICD-10-CM

## 2018-10-28 DIAGNOSIS — M85851 Other specified disorders of bone density and structure, right thigh: Secondary | ICD-10-CM | POA: Diagnosis not present

## 2018-10-28 DIAGNOSIS — M8008XA Age-related osteoporosis with current pathological fracture, vertebra(e), initial encounter for fracture: Secondary | ICD-10-CM | POA: Insufficient documentation

## 2018-10-28 DIAGNOSIS — M85852 Other specified disorders of bone density and structure, left thigh: Secondary | ICD-10-CM | POA: Diagnosis not present

## 2018-10-28 DIAGNOSIS — M810 Age-related osteoporosis without current pathological fracture: Secondary | ICD-10-CM | POA: Diagnosis not present

## 2018-10-28 MED ORDER — ROMOSOZUMAB-AQQG 105 MG/1.17ML ~~LOC~~ SOSY: 210.0000 mg | PREFILLED_SYRINGE | SUBCUTANEOUS | 11 refills | Status: DC

## 2018-10-28 MED ORDER — CALCIUM CARBONATE-VITAMIN D 500-200 MG-UNIT PO TABS: 1.0000 | ORAL_TABLET | Freq: Every day | ORAL | 3 refills | Status: DC

## 2018-10-28 MED ORDER — DENOSUMAB 60 MG/ML ~~LOC~~ SOSY: 60.0000 mg | PREFILLED_SYRINGE | Freq: Once | SUBCUTANEOUS | 0 refills | Status: DC

## 2018-10-28 NOTE — Telephone Encounter (Signed)
Pt aware of DEXA scan results, T Score of -2.9, which indicates osteoporosis. Recommended starting Prolia. Pt states she is to see Dr. Beatris Ship on Thursday 10-31-18. States she will discuss this further with him.

## 2018-10-28 NOTE — Progress Notes (Signed)
Due to current compression fracture, will start with evenity instead of prolia.

## 2018-10-30 ENCOUNTER — Encounter: Payer: Self-pay | Admitting: Vascular Surgery

## 2018-10-30 ENCOUNTER — Ambulatory Visit (INDEPENDENT_AMBULATORY_CARE_PROVIDER_SITE_OTHER): Payer: Medicare Other | Admitting: Vascular Surgery

## 2018-10-30 ENCOUNTER — Encounter

## 2018-10-30 ENCOUNTER — Ambulatory Visit: Payer: Medicare Other | Admitting: Vascular Surgery

## 2018-10-30 VITALS — BP 155/87 | HR 65 | Temp 97.7°F | Resp 18 | Ht 63.0 in | Wt 172.0 lb

## 2018-10-30 DIAGNOSIS — I776 Arteritis, unspecified: Secondary | ICD-10-CM | POA: Diagnosis not present

## 2018-10-30 DIAGNOSIS — I83892 Varicose veins of left lower extremities with other complications: Secondary | ICD-10-CM | POA: Diagnosis not present

## 2018-10-30 NOTE — Progress Notes (Signed)
Patient name: Danielle Parrish MRN: [id number] DOB: [birth year] Sex: female  REASON FOR VISIT:   Periaortitis.  The consult is requested by the [facility] emergency department  HPI:   Danielle Parrish is a pleasant 77 y.o. female who was seen apparently at the [facility] emergency department on 09/29/2018 with right flank pain.  She was found to have a T12 compression fracture and has been in a brace since that time.  She underwent a CT of the abdomen and pelvis however which showed as an incidental finding periaortitis and some regional retroperitoneal adenopathy at the level of the renal arteries.  This resulted in some mild narrowing of the left renal artery.  She was sent for vascular consultation.  She has previously been seen in our office by Dr. Kellie Simmering with varicose veins of the left leg.  She tells me that her symptoms in the right flank have improved significantly.  She continues to have some mild persistent right lower quadrant pain.  She states that her blood pressures been under good control.  She has not had any hematuria.  She denies fever chills or significant weight loss.  She has been wearing her brace for her T12 fracture.  She denies any claudication, rest pain, or nonhealing ulcers.  Past Medical History:  Diagnosis Date  . Anxiety   . Arthritis   . CHF (congestive heart failure) ([place])   . Chronic pain of multiple joints   . Depression   . Essential hypertension   . GERD (gastroesophageal reflux disease)   . Hyperlipidemia   . Hypothyroidism   . Osteoporosis   . Sleep apnea    CPAP  . Vitamin D deficiency     Family History  Problem Relation Age of Onset  . Hypertension Mother   . Stroke Mother   . Cancer Father     SOCIAL HISTORY: Social History   Tobacco Use  . Smoking status: Never Smoker  . Smokeless tobacco: Never Used  Substance Use Topics  . Alcohol use: No    Allergies  Allergen Reactions  . Crestor [Rosuvastatin] Other (See Comments)    Body  aches.   . Nsaids     Burns stomach.   . Penicillins Hives    Has patient had a PCN reaction causing immediate rash, facial/tongue/throat swelling, SOB or lightheadedness with hypotension: Yes Has patient had a PCN reaction causing severe rash involving mucus membranes or skin necrosis: unknown Has patient had a PCN reaction that required hospitalization No Has patient had a PCN reaction occurring within the last 10 years: No If all of the above answers are "NO", then may proceed with Cephalosporin use.   . Vytorin [Ezetimibe-Simvastatin]     Body aches.   . Tolmetin Nausea Only    Burns stomach.     Current Outpatient Medications  Medication Sig Dispense Refill  . amLODipine (NORVASC) 10 MG tablet TAKE 1 TABLET BY MOUTH EVERY DAY (Patient taking differently: Take 10 mg by mouth daily. ) 90 tablet 1  . aspirin 81 MG chewable tablet Chew 4 tablets by mouth daily.     . Biotin 10 MG CAPS Take 1 capsule by mouth daily.     . calcium-vitamin D (OSCAL WITH D) 500-200 MG-UNIT tablet Take 1 tablet by mouth daily with breakfast. 30 tablet 3  . Cholecalciferol (VITAMIN D-3 PO) Take 1 capsule by mouth daily.     . diclofenac sodium (VOLTAREN) 1 % GEL Apply 2 g topically 4 (  four) times daily. 100 g 5  . diphenhydrAMINE (BENADRYL) 25 MG tablet Take 50 mg by mouth at bedtime as needed for allergies.     . Ferrous Sulfate 90 (18 FE) MG TABS Take 1 tablet by mouth every morning. 90 each 1  . gabapentin (NEURONTIN) 100 MG capsule Take 100 mg by mouth 4 (four) times daily as needed.    Marland Kitchen HYDROcodone-acetaminophen (NORCO/VICODIN) 5-325 MG tablet Take 1-2 tablets by mouth every 4 (four) hours as needed for severe pain. 20 tablet 0  . ketoconazole (NIZORAL) 2 % cream Apply 1 application topically daily. 60 g 2  . levothyroxine (SYNTHROID, LEVOTHROID) 100 MCG tablet Take 1 Tablet by mouth once daily BEFORE BREAKFAST (Patient taking differently: Take 100 mcg by mouth daily before breakfast. Take 1 Tablet by  mouth once daily BEFORE BREAKFAST) 90 tablet 3  . lovastatin (MEVACOR) 40 MG tablet Take 1 tablet (40 mg total) by mouth at bedtime. 90 tablet 3  . Multiple Vitamins-Minerals (MULTIVITAMIN ADULT) CHEW Chew 2 tablets by mouth daily.    . mupirocin ointment (BACTROBAN) 2 % Apply 1 application topically 2 (two) times daily. 22 g 0  . nystatin (MYCOSTATIN/NYSTOP) powder Apply topically 4 (four) times daily. 15 g 0  . nystatin cream (MYCOSTATIN) Apply 1 application topically 2 (two) times daily. 30 g PRN  . omeprazole (PRILOSEC) 40 MG capsule Take 1 Capsule by mouth every morning (Patient taking differently: Take 40 mg by mouth daily as needed. ) 90 capsule 0  . pyridOXINE (VITAMIN B-6) 100 MG tablet Take 100 mg by mouth daily.    . Romosozumab-aqqg (EVENITY) 105 MG/1.17ML SOSY Inject 210 mg into the skin every 30 (thirty) days for 12 doses. 2.4 mL 11  . sertraline (ZOLOFT) 100 MG tablet Take 1.5 tablets (150 mg total) by mouth every morning. (Patient taking differently: Take 100 mg by mouth 2 (two) times daily. ) 135 tablet 3  . sodium chloride (OCEAN) 0.65 % SOLN nasal spray Place 1 spray into both nostrils as needed for congestion. 1 Bottle prn   No current facility-administered medications for this visit.     REVIEW OF SYSTEMS:  [X]  denotes positive finding, [ ]  denotes negative finding Cardiac  Comments:  Chest pain or chest pressure:    Shortness of breath upon exertion: x   Short of breath when lying flat:    Irregular heart rhythm:        Vascular    Pain in calf, thigh, or hip brought on by ambulation:    Pain in feet at night that wakes you up from your sleep:     Blood clot in your veins:    Leg swelling:         Pulmonary    Oxygen at home:    Productive cough:     Wheezing:         Neurologic    Sudden weakness in arms or legs:     Sudden numbness in arms or legs:     Sudden onset of difficulty speaking or slurred speech:    Temporary loss of vision in one eye:       Problems with dizziness:         Gastrointestinal    Blood in stool:     Vomited blood:         Genitourinary    Burning when urinating:     Blood in urine:        Psychiatric    Major  depression:         Hematologic    Bleeding problems:    Problems with blood clotting too easily:        Skin    Rashes or ulcers:        Constitutional    Fever or chills:     PHYSICAL EXAM:   Vitals:   10/30/18 0945  BP: (!) 155/87  Pulse: 65  Resp: 18  Temp: 97.7 F (36.5 C)  SpO2: 90%  Weight: 172 lb (78 kg)  Height: 5\' 3"  (1.6 m)    GENERAL: The patient is a well-nourished female, in no acute distress. The vital signs are documented above. CARDIAC: There is a regular rate and rhythm.  She does have a systolic ejection murmur. VASCULAR: I do not detect carotid bruits. She has palpable femoral pulses and pedal pulses bilaterally. PULMONARY: There is good air exchange bilaterally without wheezing or rales. ABDOMEN: Soft and non-tender with normal pitched bowel sounds.  I do not palpate an aneurysm although her abdomen is difficult to assess because of her size. MUSCULOSKELETAL: There are no major deformities or cyanosis. NEUROLOGIC: No focal weakness or paresthesias are detected. SKIN: There are no ulcers or rashes noted. PSYCHIATRIC: The patient has a normal affect.  DATA:    CT ABDOMEN PELVIS: I have reviewed the CT of the abdomen and pelvis that was done on 09/29/2018.  I have reviewed the images.  There does appear to be some inflammation around the juxtarenal aorta.  There appears to be some mild narrowing of the left renal artery.  The peri-aortitis appears to be limited to this area.  I do not see any issues downstream from this.  She also has a T12 compression fracture and sigmoid diverticulosis.  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  I do not think her symptoms in the right flank are related to this.  I think  this is an incidental finding.  She has not had any fever chills or weight loss.  However given that the etiology of this is not clear I have recommended a follow-up CT scan 6 months from her last study which will be in April.  I do not think to be can simply follow this with ultrasound.  I will see her back after that study.  I reassured her that I do not think her symptoms in the right flank are related to this.  I think this is simply an incidental finding.  She does not have any symptoms currently to suggest a malignancy.  If this resolves then she will not need very aggressive follow-up.  However if this progresses she may need to be considered for biopsy if possible.  Deitra Mayo [facility] [address] [phone]

## 2018-10-31 DIAGNOSIS — R030 Elevated blood-pressure reading, without diagnosis of hypertension: Secondary | ICD-10-CM | POA: Diagnosis not present

## 2018-10-31 DIAGNOSIS — Z6830 Body mass index (BMI) 30.0-30.9, adult: Secondary | ICD-10-CM | POA: Diagnosis not present

## 2018-10-31 DIAGNOSIS — S22088A Other fracture of T11-T12 vertebra, initial encounter for closed fracture: Secondary | ICD-10-CM | POA: Diagnosis not present

## 2018-11-08 ENCOUNTER — Ambulatory Visit (INDEPENDENT_AMBULATORY_CARE_PROVIDER_SITE_OTHER): Payer: Medicare Other | Admitting: Family Medicine

## 2018-11-08 ENCOUNTER — Encounter: Payer: Self-pay | Admitting: Family Medicine

## 2018-11-08 VITALS — BP 131/87 | HR 79 | Temp 97.5°F | Ht 63.0 in | Wt 172.0 lb

## 2018-11-08 DIAGNOSIS — M2550 Pain in unspecified joint: Secondary | ICD-10-CM

## 2018-11-08 DIAGNOSIS — F320 Major depressive disorder, single episode, mild: Secondary | ICD-10-CM

## 2018-11-08 DIAGNOSIS — I10 Essential (primary) hypertension: Secondary | ICD-10-CM

## 2018-11-08 DIAGNOSIS — E039 Hypothyroidism, unspecified: Secondary | ICD-10-CM

## 2018-11-08 DIAGNOSIS — B3749 Other urogenital candidiasis: Secondary | ICD-10-CM | POA: Diagnosis not present

## 2018-11-08 DIAGNOSIS — I5032 Chronic diastolic (congestive) heart failure: Secondary | ICD-10-CM | POA: Diagnosis not present

## 2018-11-08 DIAGNOSIS — K219 Gastro-esophageal reflux disease without esophagitis: Secondary | ICD-10-CM

## 2018-11-08 DIAGNOSIS — N3001 Acute cystitis with hematuria: Secondary | ICD-10-CM | POA: Diagnosis not present

## 2018-11-08 DIAGNOSIS — S22080D Wedge compression fracture of T11-T12 vertebra, subsequent encounter for fracture with routine healing: Secondary | ICD-10-CM

## 2018-11-08 DIAGNOSIS — R109 Unspecified abdominal pain: Secondary | ICD-10-CM | POA: Diagnosis not present

## 2018-11-08 DIAGNOSIS — E1159 Type 2 diabetes mellitus with other circulatory complications: Secondary | ICD-10-CM

## 2018-11-08 DIAGNOSIS — M8080XS Other osteoporosis with current pathological fracture, unspecified site, sequela: Secondary | ICD-10-CM

## 2018-11-08 DIAGNOSIS — E1169 Type 2 diabetes mellitus with other specified complication: Secondary | ICD-10-CM

## 2018-11-08 DIAGNOSIS — R7303 Prediabetes: Secondary | ICD-10-CM | POA: Diagnosis not present

## 2018-11-08 DIAGNOSIS — E7800 Pure hypercholesterolemia, unspecified: Secondary | ICD-10-CM

## 2018-11-08 DIAGNOSIS — E782 Mixed hyperlipidemia: Secondary | ICD-10-CM | POA: Diagnosis not present

## 2018-11-08 DIAGNOSIS — E785 Hyperlipidemia, unspecified: Secondary | ICD-10-CM

## 2018-11-08 DIAGNOSIS — G8929 Other chronic pain: Secondary | ICD-10-CM

## 2018-11-08 DIAGNOSIS — I152 Hypertension secondary to endocrine disorders: Secondary | ICD-10-CM

## 2018-11-08 LAB — URINALYSIS, COMPLETE
Bilirubin, UA: NEGATIVE
Glucose, UA: NEGATIVE
Ketones, UA: NEGATIVE
Nitrite, UA: POSITIVE — AB
Protein, UA: NEGATIVE
Specific Gravity, UA: 1.02 (ref 1.005–1.030)
Urobilinogen, Ur: 0.2 mg/dL (ref 0.2–1.0)
pH, UA: 6 (ref 5.0–7.5)

## 2018-11-08 LAB — MICROSCOPIC EXAMINATION: Renal Epithel, UA: NONE SEEN /hpf

## 2018-11-08 LAB — BAYER DCA HB A1C WAIVED: HB A1C (BAYER DCA - WAIVED): 6.1 % (ref <7.0)

## 2018-11-08 MED ORDER — FLUCONAZOLE 150 MG PO TABS: 150.0000 mg | ORAL_TABLET | Freq: Once | ORAL | 0 refills | Status: AC

## 2018-11-08 MED ORDER — SERTRALINE HCL 100 MG PO TABS: 150.0000 mg | ORAL_TABLET | ORAL | 3 refills | Status: DC

## 2018-11-08 MED ORDER — GABAPENTIN 100 MG PO CAPS: 100.0000 mg | ORAL_CAPSULE | Freq: Four times a day (QID) | ORAL | 1 refills | Status: DC | PRN

## 2018-11-08 MED ORDER — LEVOTHYROXINE SODIUM 100 MCG PO TABS: ORAL_TABLET | ORAL | 3 refills | Status: DC

## 2018-11-08 MED ORDER — ROMOSOZUMAB-AQQG 105 MG/1.17ML ~~LOC~~ SOSY: 210.0000 mg | PREFILLED_SYRINGE | SUBCUTANEOUS | 11 refills | Status: DC

## 2018-11-08 MED ORDER — CALCIUM CARBONATE-VITAMIN D 500-200 MG-UNIT PO TABS: 1.0000 | ORAL_TABLET | Freq: Every day | ORAL | 3 refills | Status: DC

## 2018-11-08 MED ORDER — KETOCONAZOLE 2 % EX CREA: 1.0000 "application " | TOPICAL_CREAM | Freq: Every day | CUTANEOUS | 2 refills | Status: DC

## 2018-11-08 MED ORDER — LOVASTATIN 40 MG PO TABS: 40.0000 mg | ORAL_TABLET | Freq: Every day | ORAL | 3 refills | Status: DC

## 2018-11-08 MED ORDER — AMLODIPINE BESYLATE 10 MG PO TABS: 10.0000 mg | ORAL_TABLET | Freq: Every day | ORAL | 1 refills | Status: DC

## 2018-11-08 MED ORDER — SULFAMETHOXAZOLE-TRIMETHOPRIM 800-160 MG PO TABS: 1.0000 | ORAL_TABLET | Freq: Two times a day (BID) | ORAL | 0 refills | Status: DC

## 2018-11-08 MED ORDER — RANITIDINE HCL 150 MG PO TABS: 150.0000 mg | ORAL_TABLET | Freq: Two times a day (BID) | ORAL | 3 refills | Status: DC

## 2018-11-08 NOTE — Patient Instructions (Signed)

## 2018-11-08 NOTE — Progress Notes (Signed)
Subjective:    Patient ID: Danielle Parrish, female    DOB: 11/16/1941, 77 y.o.   MRN: [id number]  Chief Complaint:  Medical Management of Chronic Issues   HPI: Danielle Parrish is a 77 y.o. female presenting on 11/08/2018 for Medical Management of Chronic Issues  Pt reports she is doing ok overall. States she continues to have lower back pain from her recent fracture. States she has a follow up appointment with neurosurgery in 4 weeks, pt aware to follow up sooner if symptoms persist or worsen. Pt states she saw vascular surgery for her aortitis and has a follow up appointment with them in 4 weeks.   1. Essential hypertension  Complaint with meds - Yes Checking BP at home ranging no Exercising Regularly - No Watching Salt intake - No Pertinent ROS:  Headache - No Chest pain - No Dyspnea - Yes with exertion Palpitations - No LE edema - No They report good compliance with medications and can restate their regimen by memory. No medication side effects   2. Chronic diastolic heart failure (HCC)  Denies orthopnea, lower extremity swelling, weight gain. Does have intermittent exertional shortness of breath.    3. Gastroesophageal reflux disease without esophagitis  Only taking Prilosec as needed, usually 2 times per week. Denies sore throat, belching, heart burn, or coughing. States her symptoms are well controlled.    4. Hypothyroidism, unspecified type  States she is compliant with her medications. Denies changes in mood, weight, hair, skin, or nails. States she has been slightly constipated due to her pain medications. Denies fatigue.   5. Compression fracture of T12 vertebra with routine healing, subsequent encounter  Continues to have pain. She is wearing her brace and taking medications as prescribed. Is followed by neurosurgery and has a follow up appointment in 4 weeks.   6. Candidiasis of perineum  Pt complains of redness and itching to perineum and under skin folds. Pt  states this has been ongoing for several weeks. Has used the nystatin cream without relief of symptoms.    7. Pre-diabetes  Pt reports a history of prediabetes. She does not watch her diet or exercise on a regular basis. She does not check her sugar. Her last A1C was 5.8 on 04/10/2018. Denies polydipsia, polyuria, or polyphagia.    8. Mixed hyperlipidemia  She does not watch her diet or exercise on a regular basis. She does not take her medications as prescribed. States she takes the Mevacor twice a week. States she just wanted to stop taking so many medications, so she stopped taking it every day. She denies chest pain.   9. Current mild episode of major depressive disorder, unspecified whether recurrent ([place])  She reports good control with her medications. States she can tell when she does not take her medications. Denies increased depression, fatigue, or anxiety. Denies side effects from the medications.    Office Visit from 11/08/2018 in [place]  PHQ-9 Total Score  3       10. Chronic pain of multiple joints  Continue pain in hips, knees, back, and shoulders. States she uses the Voltaren gel and takes her hydrocodone and gabapentin as needed for pain. States she usually takes the gabapentin 2 times per day. Denies associated side effects from the gabapentin. States she does have some constipation from the hydrocodone but manages this with stool softeners.    11. Other osteoporosis with current pathological fracture, sequela  Pt states she discussed  the Evenity with her neurosurgeon and they agree to this therapy for her osteoporosis with recent fracture. Pt states she is not taking her calcium supplement. Pt does not exercise.      12. Right flank pain  Pt reports ongoing right flank pain and urinary frequency. Denies hematuria or dysuria. States this has been ongoing for 2-3 weeks. Denies fever, chills, confusion, or weakness.      Relevant past medical,  surgical, family, and social history reviewed and updated as indicated.  Allergies and medications reviewed and updated.   Past Medical History:  Diagnosis Date  . Anxiety   . Arthritis   . CHF (congestive heart failure) ([place])   . Chronic pain of multiple joints   . Depression   . Essential hypertension   . GERD (gastroesophageal reflux disease)   . Hyperlipidemia   . Hypothyroidism   . Osteoporosis   . Sleep apnea    CPAP  . Vitamin D deficiency     Past Surgical History:  Procedure Laterality Date  . ABDOMINAL HERNIA REPAIR    . ABDOMINAL HYSTERECTOMY  1977  . BREAST EXCISIONAL BIOPSY    . KNEE ARTHROSCOPY Left 09/30/2014   Procedure: LEFT KNEE ARTHROSCOPY MEDIAL MENISECTOMY ABRASION CHONDROPLASTY SYNOVECTOMY SUPRAPATELLER CUFF;  Surgeon: Tobi Bastos, MD;  Location: [facility];  Service: Orthopedics;  Laterality: Left;    Social History   Socioeconomic History  . Marital status: Widowed    Spouse name: Laverna Peace  . Number of children: 2  . Years of education: Not on file  . Highest education level: 12th grade  Occupational History  . Occupation: CNA    Comment: [employer]  . Financial resource strain: Very hard  . Food insecurity:    Worry: Sometimes true    Inability: Sometimes true  . Transportation needs:    Medical: No    Non-medical: No  Tobacco Use  . Smoking status: Never Smoker  . Smokeless tobacco: Never Used  Substance and Sexual Activity  . Alcohol use: No  . Drug use: No  . Sexual activity: Not Currently  Lifestyle  . Physical activity:    Days per week: 0 days    Minutes per session: 0 min  . Stress: Not at all  Relationships  . Social connections:    Talks on phone: More than three times a week    Gets together: More than three times a week    Attends religious service: Never    Active member of club or organization: No    Attends meetings of clubs or organizations: Never    Relationship status: Widowed  . Intimate  partner violence:    Fear of current or ex partner: No    Emotionally abused: No    Physically abused: No    Forced sexual activity: No  Other Topics Concern  . Not on file  Social History Narrative  . Not on file    Outpatient Encounter Medications as of 11/08/2018  Medication Sig  . amLODipine (NORVASC) 10 MG tablet Take 1 tablet (10 mg total) by mouth daily.  Marland Kitchen aspirin 81 MG chewable tablet Chew 4 tablets by mouth daily.   . Biotin 10 MG CAPS Take 1 capsule by mouth daily.   . Cholecalciferol (VITAMIN D-3 PO) Take 1 capsule by mouth daily.   . diclofenac sodium (VOLTAREN) 1 % GEL Apply 2 g topically 4 (four) times daily.  . diphenhydrAMINE (BENADRYL) 25 MG tablet Take 50 mg by  mouth at bedtime as needed for allergies.   . Ferrous Sulfate 90 (18 FE) MG TABS Take 1 tablet by mouth every morning.  . gabapentin (NEURONTIN) 100 MG capsule Take 1 capsule (100 mg total) by mouth 4 (four) times daily as needed.  Marland Kitchen HYDROcodone-acetaminophen (NORCO/VICODIN) 5-325 MG tablet Take 1-2 tablets by mouth every 4 (four) hours as needed for severe pain.  Marland Kitchen ketoconazole (NIZORAL) 2 % cream Apply 1 application topically daily.  Marland Kitchen levothyroxine (SYNTHROID, LEVOTHROID) 100 MCG tablet Take 1 Tablet by mouth once daily BEFORE BREAKFAST  . lovastatin (MEVACOR) 40 MG tablet Take 1 tablet (40 mg total) by mouth at bedtime.  . Multiple Vitamins-Minerals (MULTIVITAMIN ADULT) CHEW Chew 2 tablets by mouth daily.  . mupirocin ointment (BACTROBAN) 2 % Apply 1 application topically 2 (two) times daily.  Marland Kitchen nystatin (MYCOSTATIN/NYSTOP) powder Apply topically 4 (four) times daily.  Marland Kitchen nystatin cream (MYCOSTATIN) Apply 1 application topically 2 (two) times daily.  Marland Kitchen pyridOXINE (VITAMIN B-6) 100 MG tablet Take 100 mg by mouth daily.  . sertraline (ZOLOFT) 100 MG tablet Take 1.5 tablets (150 mg total) by mouth every morning.  . sodium chloride (OCEAN) 0.65 % SOLN nasal spray Place 1 spray into both nostrils as needed for  congestion.  . [DISCONTINUED] amLODipine (NORVASC) 10 MG tablet TAKE 1 TABLET BY MOUTH EVERY DAY (Patient taking differently: Take 10 mg by mouth daily. )  . [DISCONTINUED] gabapentin (NEURONTIN) 100 MG capsule Take 100 mg by mouth 4 (four) times daily as needed.  . [DISCONTINUED] ketoconazole (NIZORAL) 2 % cream Apply 1 application topically daily.  . [DISCONTINUED] levothyroxine (SYNTHROID, LEVOTHROID) 100 MCG tablet Take 1 Tablet by mouth once daily BEFORE BREAKFAST (Patient taking differently: Take 100 mcg by mouth daily before breakfast. Take 1 Tablet by mouth once daily BEFORE BREAKFAST)  . [DISCONTINUED] lovastatin (MEVACOR) 40 MG tablet Take 1 tablet (40 mg total) by mouth at bedtime.  . [DISCONTINUED] omeprazole (PRILOSEC) 40 MG capsule Take 1 Capsule by mouth every morning (Patient taking differently: Take 40 mg by mouth daily as needed. )  . [DISCONTINUED] sertraline (ZOLOFT) 100 MG tablet Take 1.5 tablets (150 mg total) by mouth every morning. (Patient taking differently: Take 100 mg by mouth 2 (two) times daily. )  . calcium-vitamin D (OSCAL WITH D) 500-200 MG-UNIT tablet Take 1 tablet by mouth daily with breakfast.  . fluconazole (DIFLUCAN) 150 MG tablet Take 1 tablet (150 mg total) by mouth once for 1 dose.  . ranitidine (ZANTAC) 150 MG tablet Take 1 tablet (150 mg total) by mouth 2 (two) times daily.  . Romosozumab-aqqg (EVENITY) 105 MG/1.17ML SOSY Inject 210 mg into the skin every 30 (thirty) days for 12 doses.  Marland Kitchen sulfamethoxazole-trimethoprim (BACTRIM DS) 800-160 MG tablet Take 1 tablet by mouth 2 (two) times daily for 7 days.  . [DISCONTINUED] calcium-vitamin D (OSCAL WITH D) 500-200 MG-UNIT tablet Take 1 tablet by mouth daily with breakfast.  . [DISCONTINUED] Romosozumab-aqqg (EVENITY) 105 MG/1.17ML SOSY Inject 210 mg into the skin every 30 (thirty) days for 12 doses. (Patient not taking: Reported on 11/08/2018)   No facility-administered encounter medications on file as of  11/08/2018.     Allergies  Allergen Reactions  . Crestor [Rosuvastatin] Other (See Comments)    Body aches.   . Nsaids     Burns stomach.   . Penicillins Hives    Has patient had a PCN reaction causing immediate rash, facial/tongue/throat swelling, SOB or lightheadedness with hypotension: Yes Has patient had a  PCN reaction causing severe rash involving mucus membranes or skin necrosis: unknown Has patient had a PCN reaction that required hospitalization No Has patient had a PCN reaction occurring within the last 10 years: No If all of the above answers are "NO", then may proceed with Cephalosporin use.   . Vytorin [Ezetimibe-Simvastatin]     Body aches.   . Tolmetin Nausea Only    Burns stomach.     Review of Systems  Constitutional: Negative for chills, fatigue, fever and unexpected weight change.  HENT: Negative for nosebleeds, sore throat and trouble swallowing.   Eyes: Negative for photophobia and visual disturbance.  Respiratory: Positive for shortness of breath (intermittent exertional ). Negative for cough and chest tightness.   Cardiovascular: Negative for chest pain, palpitations and leg swelling.  Gastrointestinal: Positive for constipation. Negative for abdominal distention, abdominal pain, blood in stool, nausea and vomiting.  Endocrine: Negative for cold intolerance, heat intolerance, polydipsia, polyphagia and polyuria.  Genitourinary: Positive for enuresis, flank pain (right) and frequency. Negative for difficulty urinating, dysuria, hematuria and vaginal discharge.  Musculoskeletal: Positive for arthralgias and back pain. Negative for gait problem, joint swelling, myalgias, neck pain and neck stiffness.  Skin: Positive for rash (under panniculus and to perineum).  Neurological: Negative for dizziness, tremors, seizures, syncope, weakness, light-headedness, numbness and headaches.  Psychiatric/Behavioral: Negative for behavioral problems, confusion, dysphoric mood,  sleep disturbance and suicidal ideas.  All other systems reviewed and are negative.       Objective:    BP 131/87   Pulse 79   Temp (!) 97.5 F (36.4 C) (Oral)   Ht '5\' 3"'  (1.6 m)   Wt 172 lb (78 kg)   BMI 30.47 kg/m    Wt Readings from Last 3 Encounters:  11/08/18 172 lb (78 kg)  10/30/18 172 lb (78 kg)  10/01/18 175 lb (79.4 kg)    Physical Exam  Constitutional: She is oriented to person, place, and time. She appears well-developed and well-nourished. She is cooperative. No distress.  HENT:  Head: Normocephalic and atraumatic.  Right Ear: Hearing, tympanic membrane, external ear and ear canal normal.  Left Ear: Tympanic membrane, external ear and ear canal normal. Decreased hearing (has hearing aid) is noted.  Nose: Nose normal.  Mouth/Throat: Uvula is midline, oropharynx is clear and moist and mucous membranes are normal. No posterior oropharyngeal erythema. No tonsillar exudate.  Eyes: Pupils are equal, round, and reactive to light. Conjunctivae, EOM and lids are normal.  Neck: Trachea normal, full passive range of motion without pain and phonation normal. Neck supple. No JVD present. Carotid bruit is not present. No thyroid mass and no thyromegaly present.  Cardiovascular: Normal rate, regular rhythm and normal pulses. Exam reveals no gallop and no friction rub.  Murmur heard.  Systolic murmur is present with a grade of 2/6. Pulmonary/Chest: Effort normal and breath sounds normal.  Abdominal: Soft. Normal appearance and bowel sounds are normal. There is no hepatosplenomegaly. There is no tenderness. There is no CVA tenderness.  Musculoskeletal:       Right shoulder: Normal.       Left shoulder: Normal.       Right knee: Normal.       Left knee: Normal.       Thoracic back: She exhibits tenderness. She exhibits no swelling, no edema and no deformity.       Lumbar back: She exhibits tenderness. She exhibits no swelling, no edema and no deformity.  Lymphadenopathy:     She  has no cervical adenopathy.  Neurological: She is alert and oriented to person, place, and time. She has normal strength. No cranial nerve deficit or sensory deficit.  Skin: Skin is warm and dry. Capillary refill takes less than 2 seconds. There is erythema (under panniculus ). No cyanosis.  Psychiatric: She has a normal mood and affect. Her speech is normal and behavior is normal. Judgment and thought content normal. Cognition and memory are normal.  Nursing note and vitals reviewed.   Results for orders placed or performed during the hospital encounter of 88/91/69  Basic metabolic panel  Result Value Ref Range   Sodium 136 135 - 145 mmol/L   Potassium 3.6 3.5 - 5.1 mmol/L   Chloride 100 98 - 111 mmol/L   CO2 30 22 - 32 mmol/L   Glucose, Bld 117 (H) 70 - 99 mg/dL   BUN 15 8 - 23 mg/dL   Creatinine, Ser 0.52 0.44 - 1.00 mg/dL   Calcium 9.5 8.9 - 10.3 mg/dL   GFR calc non Af Amer >60 >60 mL/min   GFR calc Af Amer >60 >60 mL/min   Anion gap 6 5 - 15  CBC with Differential  Result Value Ref Range   WBC 5.5 4.0 - 10.5 K/uL   RBC 4.23 3.87 - 5.11 MIL/uL   Hemoglobin 12.6 12.0 - 15.0 g/dL   HCT 39.6 36.0 - 46.0 %   MCV 93.6 78.0 - 100.0 fL   MCH 29.8 26.0 - 34.0 pg   MCHC 31.8 30.0 - 36.0 g/dL   RDW 13.1 11.5 - 15.5 %   Platelets 261 150 - 400 K/uL   Neutrophils Relative % 54 %   Neutro Abs 3.0 1.7 - 7.7 K/uL   Lymphocytes Relative 32 %   Lymphs Abs 1.8 0.7 - 4.0 K/uL   Monocytes Relative 12 %   Monocytes Absolute 0.6 0.1 - 1.0 K/uL   Eosinophils Relative 2 %   Eosinophils Absolute 0.1 0.0 - 0.7 K/uL   Basophils Relative 0 %   Basophils Absolute 0.0 0.0 - 0.1 K/uL  Urinalysis, Routine w reflex microscopic  Result Value Ref Range   Color, Urine STRAW (A) YELLOW   APPearance CLEAR CLEAR   Specific Gravity, Urine 1.006 1.005 - 1.030   pH 6.0 5.0 - 8.0   Glucose, UA NEGATIVE NEGATIVE mg/dL   Hgb urine dipstick SMALL (A) NEGATIVE   Bilirubin Urine NEGATIVE NEGATIVE    Ketones, ur NEGATIVE NEGATIVE mg/dL   Protein, ur NEGATIVE NEGATIVE mg/dL   Nitrite NEGATIVE NEGATIVE   Leukocytes, UA SMALL (A) NEGATIVE   RBC / HPF 0-5 0 - 5 RBC/hpf   WBC, UA 6-10 0 - 5 WBC/hpf   Bacteria, UA RARE (A) NONE SEEN   Squamous Epithelial / LPF 0-5 0 - 5   Mucus PRESENT      Urinalysis positive for yeast, leukocytes, nitrites, and trace blood.   Total time spent with patient 45 minutes.  Greater than 50% of encounter spent in coordination of care/counseling.   Pertinent labs & imaging results that were available during my care of the patient were reviewed by me and considered in my medical decision making.  Assessment & Plan:  Veleka was seen today for medical management of chronic issues.  Diagnoses and all orders for this visit:  Essential hypertension DASH diet. Exercise discussed. Continue medications as prescribed.  -     CBC with Differential/Platelet -     CMP14+EGFR -     Microalbumin /  creatinine urine ratio -     Lipid panel -     amLODipine (NORVASC) 10 MG tablet; Take 1 tablet (10 mg total) by mouth daily.  Chronic diastolic heart failure (HCC) Monitor weight. Limit sodium intake. Report any new or worsening symptoms. -     CMP14+EGFR -     Lipid panel  Gastroesophageal reflux disease without esophagitis Will switch Prilosec to zantac due to osteoporosis with recent fracture. Pt is well controlled on twice weekly Prilosec, so step down therapy should be effective.   Hypothyroidism, unspecified type Will adjust synthroid if warranted.  -     TSH -     levothyroxine (SYNTHROID, LEVOTHROID) 100 MCG tablet; Take 1 Tablet by mouth once daily BEFORE BREAKFAST  Compression fracture of T12 vertebra with routine healing, subsequent encounter Keep follow up with neurosurgery. Will start on Evenity. Refill for Oscal with D sent to pharmacy.  Candidiasis of perineum Urinalysis positive for yeast. Will treat with oral diflucan and provide ketoconazole cream  for skin folds. Keep skin folds clean and dry. Wear moisture wicking underwear.  -     ketoconazole (NIZORAL) 2 % cream; Apply 1 application topically daily. -     fluconazole (DIFLUCAN) 150 MG tablet; Take 1 tablet (150 mg total) by mouth once for 1 dose.  Type 2 diabetes mellitus with hypercholesterolemia (HCC) A1C today 6.1. Pt wants to trial diet and exercise for management and recheck A1C in 3 months. -     CMP14+EGFR -     Bayer DCA Hb A1c Waived  Mixed hyperlipidemia Diet and exercise encouraged. Medications as prescribed.  -     CMP14+EGFR -     Lipid panel -     lovastatin (MEVACOR) 40 MG tablet; Take 1 tablet (40 mg total) by mouth at bedtime.  Current mild episode of major depressive disorder, unspecified whether recurrent ([place]) Symptoms well controlled with Zoloft. Report any new or worsening symptoms. Medications as prescribed.  -     sertraline (ZOLOFT) 100 MG tablet; Take 1.5 tablets (150 mg total) by mouth every morning.  Chronic pain of multiple joints Continue medications as prescribed.  gabapentin (NEURONTIN) 100 MG capsule           Take 1 capsule (100 mg total) by mouth 4 (four) times daily as needed., Starting Fri 11/08/2018, Until Tue 01/07/2019, Normal    Other osteoporosis with current pathological fracture, sequela Keep follow up with neurosurgery. Will start on Evenity. Refill for Oscal with D sent to pharmacy. Weight bearing exercises encouraged.  -     Romosozumab-aqqg (EVENITY) 105 MG/1.17ML SOSY; Inject 210 mg into the skin every 30 (thirty) days for 12 doses.   Right flank pain Urinalysis positive for yeast, leukocytes, nitrites, and trace of blood.  -     Urinalysis, Complete  Acute cystitis with hematuria Urinalysis positive for yeast, leukocytes, nitrites, and trace of blood.  Increase water intake. Avoid baths, take showers. Proper wiping discussed. Will add culture to urine.  sulfamethoxazole-trimethoprim (BACTRIM DS) 800-160 MG tablet            Take 1 tablet by mouth 2 (two) times daily for 7 days., Starting Fri 11/08/2018, Until Fri 11/15/2018, Normal    Pt aware she has a pain management agreement with Dr. Warrick Parisian and will need to follow up with him for management of controlled substances.   Continue all other maintenance medications.  Follow up plan: Return in about 3 months (around 02/08/2019), or if symptoms worsen  or fail to improve.  Pt will follow up in 2 weeks to recheck urinalysis.   Educational handout given for health maintenance   The above assessment and management plan was discussed with the patient. The patient verbalized understanding of and has agreed to the management plan. Patient is aware to call the clinic if symptoms persist or worsen. Patient is aware when to return to the clinic for a follow-up visit. Patient educated on when it is appropriate to go to the emergency department.   Monia Pouch, FNP-C [facility] [phone]

## 2018-11-09 LAB — CMP14+EGFR
ALT: 13 IU/L (ref 0–32)
AST: 16 IU/L (ref 0–40)
Albumin/Globulin Ratio: 1.4 (ref 1.2–2.2)
Albumin: 4.2 g/dL (ref 3.5–4.8)
Alkaline Phosphatase: 97 IU/L (ref 39–117)
BUN/Creatinine Ratio: 14 (ref 12–28)
BUN: 10 mg/dL (ref 8–27)
Bilirubin Total: 0.2 mg/dL (ref 0.0–1.2)
CO2: 26 mmol/L (ref 20–29)
Calcium: 10.2 mg/dL (ref 8.7–10.3)
Chloride: 97 mmol/L (ref 96–106)
Creatinine, Ser: 0.73 mg/dL (ref 0.57–1.00)
GFR calc Af Amer: 92 mL/min/{1.73_m2} (ref >59)
GFR calc non Af Amer: 80 mL/min/{1.73_m2} (ref >59)
Globulin, Total: 3.1 g/dL (ref 1.5–4.5)
Glucose: 104 mg/dL — ABNORMAL HIGH (ref 65–99)
Potassium: 4.5 mmol/L (ref 3.5–5.2)
Sodium: 140 mmol/L (ref 134–144)
Total Protein: 7.3 g/dL (ref 6.0–8.5)

## 2018-11-09 LAB — CBC WITH DIFFERENTIAL/PLATELET
Basophils Absolute: 0 10*3/uL (ref 0.0–0.2)
Basos: 1 %
EOS (ABSOLUTE): 0.1 10*3/uL (ref 0.0–0.4)
Eos: 1 %
Hematocrit: 41.4 % (ref 34.0–46.6)
Hemoglobin: 13.2 g/dL (ref 11.1–15.9)
Immature Grans (Abs): 0 10*3/uL (ref 0.0–0.1)
Immature Granulocytes: 0 %
Lymphocytes Absolute: 2 10*3/uL (ref 0.7–3.1)
Lymphs: 28 %
MCH: 28.3 pg (ref 26.6–33.0)
MCHC: 31.9 g/dL (ref 31.5–35.7)
MCV: 89 fL (ref 79–97)
Monocytes Absolute: 0.6 10*3/uL (ref 0.1–0.9)
Monocytes: 9 %
Neutrophils Absolute: 4.3 10*3/uL (ref 1.4–7.0)
Neutrophils: 61 %
Platelets: 285 10*3/uL (ref 150–450)
RBC: 4.67 x10E6/uL (ref 3.77–5.28)
RDW: 12.7 % (ref 12.3–15.4)
WBC: 7 10*3/uL (ref 3.4–10.8)

## 2018-11-09 LAB — MICROALBUMIN / CREATININE URINE RATIO
Creatinine, Urine: 83 mg/dL
Microalb/Creat Ratio: 37.8 mg/g creat — ABNORMAL HIGH (ref 0.0–30.0)
Microalbumin, Urine: 31.4 ug/mL

## 2018-11-09 LAB — LIPID PANEL
Chol/HDL Ratio: 3.5 ratio (ref 0.0–4.4)
Cholesterol, Total: 165 mg/dL (ref 100–199)
HDL: 47 mg/dL (ref >39)
LDL Calculated: 89 mg/dL (ref 0–99)
Triglycerides: 143 mg/dL (ref 0–149)
VLDL Cholesterol Cal: 29 mg/dL (ref 5–40)

## 2018-11-09 LAB — TSH: TSH: 1.01 u[IU]/mL (ref 0.450–4.500)

## 2018-11-10 LAB — URINE CULTURE

## 2018-11-11 ENCOUNTER — Telehealth: Payer: Self-pay | Admitting: *Deleted

## 2018-11-11 ENCOUNTER — Other Ambulatory Visit: Payer: Self-pay | Admitting: Family Medicine

## 2018-11-11 DIAGNOSIS — N3001 Acute cystitis with hematuria: Secondary | ICD-10-CM

## 2018-11-11 MED ORDER — CIPROFLOXACIN HCL 500 MG PO TABS: 500.0000 mg | ORAL_TABLET | Freq: Two times a day (BID) | ORAL | 0 refills | Status: AC

## 2018-11-11 NOTE — Telephone Encounter (Signed)
Patient is able to get Prolia as buy and bill through her medical benefit at no cost to her. The script was originally sent in to her pharmacy. They contacted her and explained that it was being sent to the specialty pharmacy and that they would call her to authorize payment and shipment. I've explained the process to her and advised her to tell the specialty pharmacy that she does not agree to payment and shipment. If this is processed through her pharmacy benefit then she will owe a brand name copay and the amount paid by her insurance will go towards her doughnut hole. She is much better off using her medical benefit and purchasing it through our office. She stated understanding.   Appt scheduled for 11/19/2018 for injection. I will order Prolia from [facility].

## 2018-11-12 ENCOUNTER — Other Ambulatory Visit: Payer: Self-pay

## 2018-11-12 DIAGNOSIS — Z87440 Personal history of urinary (tract) infections: Secondary | ICD-10-CM

## 2018-11-18 ENCOUNTER — Telehealth: Payer: Self-pay | Admitting: Family Medicine

## 2018-11-18 ENCOUNTER — Encounter: Payer: Self-pay | Admitting: *Deleted

## 2018-11-18 NOTE — Telephone Encounter (Addendum)
Patient has central lower back pain. Denies flank pain, dysuria, or hematuria. Finished 5 days of cipro  2 days ago. E Coli present in culture and was sensitive to Cipro. Due to lack of other UTI symptoms I feel that another evaluation is warranted. Patient was agreeable and appt scheduled for tomorrow with her PCP at 2:05. She is aware that someone is available by phone 24x7 if she has any questions or problems. Severe worsening of symptoms would warrant an ED visit.

## 2018-11-18 NOTE — Telephone Encounter (Deleted)
Called patient

## 2018-11-19 ENCOUNTER — Other Ambulatory Visit: Payer: Self-pay | Admitting: Family Medicine

## 2018-11-19 ENCOUNTER — Ambulatory Visit (INDEPENDENT_AMBULATORY_CARE_PROVIDER_SITE_OTHER): Payer: Medicare Other | Admitting: Family Medicine

## 2018-11-19 ENCOUNTER — Encounter: Payer: Self-pay | Admitting: Family Medicine

## 2018-11-19 ENCOUNTER — Ambulatory Visit (INDEPENDENT_AMBULATORY_CARE_PROVIDER_SITE_OTHER): Payer: Medicare Other

## 2018-11-19 ENCOUNTER — Ambulatory Visit: Payer: Medicare Other

## 2018-11-19 VITALS — BP 136/70 | HR 70 | Temp 97.4°F | Ht 63.0 in | Wt 172.0 lb

## 2018-11-19 DIAGNOSIS — M545 Low back pain, unspecified: Secondary | ICD-10-CM

## 2018-11-19 DIAGNOSIS — Z1231 Encounter for screening mammogram for malignant neoplasm of breast: Secondary | ICD-10-CM

## 2018-11-19 DIAGNOSIS — Z87440 Personal history of urinary (tract) infections: Secondary | ICD-10-CM

## 2018-11-19 DIAGNOSIS — M549 Dorsalgia, unspecified: Secondary | ICD-10-CM | POA: Diagnosis not present

## 2018-11-19 LAB — MICROSCOPIC EXAMINATION: Renal Epithel, UA: NONE SEEN /hpf

## 2018-11-19 LAB — URINALYSIS, COMPLETE
Bilirubin, UA: NEGATIVE
Glucose, UA: NEGATIVE
Ketones, UA: NEGATIVE
Nitrite, UA: NEGATIVE
Protein, UA: NEGATIVE
Specific Gravity, UA: 1.02 (ref 1.005–1.030)
Urobilinogen, Ur: 0.2 mg/dL (ref 0.2–1.0)
pH, UA: 5.5 (ref 5.0–7.5)

## 2018-11-19 MED ORDER — KETOROLAC TROMETHAMINE 60 MG/2ML IM SOLN
60.0000 mg | Freq: Once | INTRAMUSCULAR | Status: AC
  Administered 2018-11-19: 60 mg via INTRAMUSCULAR

## 2018-11-19 NOTE — Patient Instructions (Signed)
Back Exercises If you have pain in your back, do these exercises 2-3 times each day or as told by your doctor. When the pain goes away, do the exercises once each day, but repeat the steps more times for each exercise (do more repetitions). If you do not have pain in your back, do these exercises once each day or as told by your doctor. Exercises Single Knee to Chest  Do these steps 3-5 times in a row for each leg: 1. Lie on your back on a firm bed or the floor with your legs stretched out. 2. Bring one knee to your chest. 3. Hold your knee to your chest by grabbing your knee or thigh. 4. Pull on your knee until you feel a gentle stretch in your lower back. 5. Keep doing the stretch for 10-30 seconds. 6. Slowly let go of your leg and straighten it.  Pelvic Tilt  Do these steps 5-10 times in a row: 1. Lie on your back on a firm bed or the floor with your legs stretched out. 2. Bend your knees so they point up to the ceiling. Your feet should be flat on the floor. 3. Tighten your lower belly (abdomen) muscles to press your lower back against the floor. This will make your tailbone point up to the ceiling instead of pointing down to your feet or the floor. 4. Stay in this position for 5-10 seconds while you gently tighten your muscles and breathe evenly.  Cat-Cow  Do these steps until your lower back bends more easily: 1. Get on your hands and knees on a firm surface. Keep your hands under your shoulders, and keep your knees under your hips. You may put padding under your knees. 2. Let your head hang down, and make your tailbone point down to the floor so your lower back is round like the back of a cat. 3. Stay in this position for 5 seconds. 4. Slowly lift your head and make your tailbone point up to the ceiling so your back hangs low (sags) like the back of a cow. 5. Stay in this position for 5 seconds.  Press-Ups  Do these steps 5-10 times in a row: 1. Lie on your belly (face-down)  on the floor. 2. Place your hands near your head, about shoulder-width apart. 3. While you keep your back relaxed and keep your hips on the floor, slowly straighten your arms to raise the top half of your body and lift your shoulders. Do not use your back muscles. To make yourself more comfortable, you may change where you place your hands. 4. Stay in this position for 5 seconds. 5. Slowly return to lying flat on the floor.  Bridges  Do these steps 10 times in a row: 1. Lie on your back on a firm surface. 2. Bend your knees so they point up to the ceiling. Your feet should be flat on the floor. 3. Tighten your butt muscles and lift your butt off of the floor until your waist is almost as high as your knees. If you do not feel the muscles working in your butt and the back of your thighs, slide your feet 1-2 inches farther away from your butt. 4. Stay in this position for 3-5 seconds. 5. Slowly lower your butt to the floor, and let your butt muscles relax.  If this exercise is too easy, try doing it with your arms crossed over your chest. Belly Crunches  Do these steps 5-10 times in   a row: 1. Lie on your back on a firm bed or the floor with your legs stretched out. 2. Bend your knees so they point up to the ceiling. Your feet should be flat on the floor. 3. Cross your arms over your chest. 4. Tip your chin a little bit toward your chest but do not bend your neck. 5. Tighten your belly muscles and slowly raise your chest just enough to lift your shoulder blades a tiny bit off of the floor. 6. Slowly lower your chest and your head to the floor.  Back Lifts Do these steps 5-10 times in a row: 1. Lie on your belly (face-down) with your arms at your sides, and rest your forehead on the floor. 2. Tighten the muscles in your legs and your butt. 3. Slowly lift your chest off of the floor while you keep your hips on the floor. Keep the back of your head in line with the curve in your back. Look at  the floor while you do this. 4. Stay in this position for 3-5 seconds. 5. Slowly lower your chest and your face to the floor.  Contact a doctor if:  Your back pain gets a lot worse when you do an exercise.  Your back pain does not lessen 2 hours after you exercise. If you have any of these problems, stop doing the exercises. Do not do them again unless your doctor says it is okay. Get help right away if:  You have sudden, very bad back pain. If this happens, stop doing the exercises. Do not do them again unless your doctor says it is okay. This information is not intended to replace advice given to you by your health care provider. Make sure you discuss any questions you have with your health care provider. Document Released: 01/13/2011 Document Revised: 05/18/2016 Document Reviewed: 02/04/2015 Elsevier Interactive Patient Education  2018 Elsevier Inc.  Back Pain, Adult Back pain is very common. The pain often gets better over time. The cause of back pain is usually not dangerous. Most people can learn to manage their back pain on their own. Follow these instructions at home: Watch your back pain for any changes. The following actions may help to lessen any pain you are feeling:  Stay active. Start with short walks on flat ground if you can. Try to walk farther each day.  Exercise regularly as told by your doctor. Exercise helps your back heal faster. It also helps avoid future injury by keeping your muscles strong and flexible.  Do not sit, drive, or stand in one place for more than 30 minutes.  Do not stay in bed. Resting more than 1-2 days can slow down your recovery.  Be careful when you bend or lift an object. Use good form when lifting: ? Bend at your knees. ? Keep the object close to your body. ? Do not twist.  Sleep on a firm mattress. Lie on your side, and bend your knees. If you lie on your back, put a pillow under your knees.  Take medicines only as told by your  doctor.  Put ice on the injured area. ? Put ice in a plastic bag. ? Place a towel between your skin and the bag. ? Leave the ice on for 20 minutes, 2-3 times a day for the first 2-3 days. After that, you can switch between ice and heat packs.  Avoid feeling anxious or stressed. Find good ways to deal with stress, such as exercise.    Maintain a healthy weight. Extra weight puts stress on your back.  Contact a doctor if:  You have pain that does not go away with rest or medicine.  You have worsening pain that goes down into your legs or buttocks.  You have pain that does not get better in one week.  You have pain at night.  You lose weight.  You have a fever or chills. Get help right away if:  You cannot control when you poop (bowel movement) or pee (urinate).  Your arms or legs feel weak.  Your arms or legs lose feeling (numbness).  You feel sick to your stomach (nauseous) or throw up (vomit).  You have belly (abdominal) pain.  You feel like you may pass out (faint). This information is not intended to replace advice given to you by your health care provider. Make sure you discuss any questions you have with your health care provider. Document Released: 05/29/2008 Document Revised: 05/18/2016 Document Reviewed: 04/14/2014 Elsevier Interactive Patient Education  2018 Elsevier Inc.  

## 2018-11-19 NOTE — Progress Notes (Signed)
Subjective:    Patient ID: Danielle Parrish, female    DOB: 10/19/1941, 77 y.o.   MRN: [id number]  Chief Complaint:  Urinary Tract Infection (hurting across lower back, sharp pain in right lower back)   HPI: Danielle Parrish is a 77 y.o. female presenting on 11/19/2018 for Urinary Tract Infection (hurting across lower back, sharp pain in right lower back)  Pt presents today with complaints of lower back pain. This is an ongoing problem. The current episode started 3 weeks ago. The problem occurs intermittently. The problem has been gradually worsening. The quality of the pain is described as sharp and burning. The pain is at a severity of 6/10. Associated symptoms include pain in right hip. Pt denies fever, chills, dysuria, unexplained weight loss, fatigue, recent injury, numbness or tingling in legs, bowel or bladder problems, weakness, or loss of function. She has tried hydrocodone for the symptoms. The treatment provided minimal relief.  She was recently treated for an UTI, will recheck today for resolution.   Relevant past medical, surgical, family, and social history reviewed and updated as indicated.  Allergies and medications reviewed and updated.   Past Medical History:  Diagnosis Date  . Anxiety   . Arthritis   . CHF (congestive heart failure) ([place])   . Chronic pain of multiple joints   . Depression   . Essential hypertension   . GERD (gastroesophageal reflux disease)   . Hyperlipidemia   . Hyperlipidemia associated with type 2 diabetes mellitus ([place])   . Hypertension associated with type 2 diabetes mellitus ([place])   . Hypothyroidism   . Osteoporosis   . Sleep apnea    CPAP  . Type 2 diabetes mellitus with hypercholesterolemia ([place])   . Vitamin D deficiency     Past Surgical History:  Procedure Laterality Date  . ABDOMINAL HERNIA REPAIR    . ABDOMINAL HYSTERECTOMY  1977  . BREAST EXCISIONAL BIOPSY    . KNEE ARTHROSCOPY Left 09/30/2014   Procedure: LEFT KNEE  ARTHROSCOPY MEDIAL MENISECTOMY ABRASION CHONDROPLASTY SYNOVECTOMY SUPRAPATELLER CUFF;  Surgeon: Tobi Bastos, MD;  Location: [facility];  Service: Orthopedics;  Laterality: Left;    Social History   Socioeconomic History  . Marital status: Widowed    Spouse name: Laverna Peace  . Number of children: 2  . Years of education: Not on file  . Highest education level: 12th grade  Occupational History  . Occupation: CNA    Comment: [employer]  . Financial resource strain: Very hard  . Food insecurity:    Worry: Sometimes true    Inability: Sometimes true  . Transportation needs:    Medical: No    Non-medical: No  Tobacco Use  . Smoking status: Never Smoker  . Smokeless tobacco: Never Used  Substance and Sexual Activity  . Alcohol use: No  . Drug use: No  . Sexual activity: Not Currently  Lifestyle  . Physical activity:    Days per week: 0 days    Minutes per session: 0 min  . Stress: Not at all  Relationships  . Social connections:    Talks on phone: More than three times a week    Gets together: More than three times a week    Attends religious service: Never    Active member of club or organization: No    Attends meetings of clubs or organizations: Never    Relationship status: Widowed  . Intimate partner violence:    Fear  of current or ex partner: No    Emotionally abused: No    Physically abused: No    Forced sexual activity: No  Other Topics Concern  . Not on file  Social History Narrative  . Not on file    Outpatient Encounter Medications as of 11/19/2018  Medication Sig  . amLODipine (NORVASC) 10 MG tablet Take 1 tablet (10 mg total) by mouth daily.  Marland Kitchen aspirin 81 MG chewable tablet Chew 4 tablets by mouth daily.   . Biotin 10 MG CAPS Take 1 capsule by mouth daily.   . calcium-vitamin D (OSCAL WITH D) 500-200 MG-UNIT tablet Take 1 tablet by mouth daily with breakfast.  . Cholecalciferol (VITAMIN D-3 PO) Take 1 capsule by mouth daily.   . diclofenac  sodium (VOLTAREN) 1 % GEL Apply 2 g topically 4 (four) times daily.  . diphenhydrAMINE (BENADRYL) 25 MG tablet Take 50 mg by mouth at bedtime as needed for allergies.   . Ferrous Sulfate 90 (18 FE) MG TABS Take 1 tablet by mouth every morning.  . gabapentin (NEURONTIN) 100 MG capsule Take 1 capsule (100 mg total) by mouth 4 (four) times daily as needed.  Marland Kitchen HYDROcodone-acetaminophen (NORCO/VICODIN) 5-325 MG tablet Take 1-2 tablets by mouth every 4 (four) hours as needed for severe pain.  Marland Kitchen ketoconazole (NIZORAL) 2 % cream Apply 1 application topically daily.  Marland Kitchen levothyroxine (SYNTHROID, LEVOTHROID) 100 MCG tablet Take 1 Tablet by mouth once daily BEFORE BREAKFAST  . lovastatin (MEVACOR) 40 MG tablet Take 1 tablet (40 mg total) by mouth at bedtime.  . Multiple Vitamins-Minerals (MULTIVITAMIN ADULT) CHEW Chew 2 tablets by mouth daily.  . mupirocin ointment (BACTROBAN) 2 % Apply 1 application topically 2 (two) times daily.  Marland Kitchen nystatin (MYCOSTATIN/NYSTOP) powder Apply topically 4 (four) times daily.  Marland Kitchen nystatin cream (MYCOSTATIN) Apply 1 application topically 2 (two) times daily.  Marland Kitchen pyridOXINE (VITAMIN B-6) 100 MG tablet Take 100 mg by mouth daily.  . ranitidine (ZANTAC) 150 MG tablet Take 1 tablet (150 mg total) by mouth 2 (two) times daily.  . Romosozumab-aqqg (EVENITY) 105 MG/1.17ML SOSY Inject 210 mg into the skin every 30 (thirty) days for 12 doses.  Marland Kitchen sertraline (ZOLOFT) 100 MG tablet Take 1.5 tablets (150 mg total) by mouth every morning.  . sodium chloride (OCEAN) 0.65 % SOLN nasal spray Place 1 spray into both nostrils as needed for congestion.   Facility-Administered Encounter Medications as of 11/19/2018  Medication  . ketorolac (TORADOL) injection 60 mg    Allergies  Allergen Reactions  . Crestor [Rosuvastatin] Other (See Comments)    Body aches.   . Nsaids     Burns stomach.   . Penicillins Hives    Has patient had a PCN reaction causing immediate rash, facial/tongue/throat  swelling, SOB or lightheadedness with hypotension: Yes Has patient had a PCN reaction causing severe rash involving mucus membranes or skin necrosis: unknown Has patient had a PCN reaction that required hospitalization No Has patient had a PCN reaction occurring within the last 10 years: No If all of the above answers are "NO", then may proceed with Cephalosporin use.   . Vytorin [Ezetimibe-Simvastatin]     Body aches.   . Tolmetin Nausea Only    Burns stomach.     Review of Systems  Constitutional: Negative for activity change, chills, fatigue, fever and unexpected weight change.  Respiratory: Negative for shortness of breath.   Cardiovascular: Negative for chest pain, palpitations and leg swelling.  Gastrointestinal: Negative for  abdominal pain, constipation, diarrhea, nausea, rectal pain and vomiting.  Genitourinary: Negative for dysuria, frequency, hematuria, pelvic pain, urgency and vaginal pain.  Musculoskeletal: Positive for back pain.  Neurological: Negative for dizziness, weakness, light-headedness, numbness and headaches.  Psychiatric/Behavioral: Negative for confusion.  All other systems reviewed and are negative.       Objective:    BP 136/70   Pulse 70   Temp (!) 97.4 F (36.3 C) (Oral)   Ht _0  (1.6 m)   Wt 172 lb (78 kg)   BMI 30.47 kg/m    Wt Readings from Last 3 Encounters:  11/19/18 172 lb (78 kg)  11/08/18 172 lb (78 kg)  10/30/18 172 lb (78 kg)    Physical Exam  Constitutional: She appears well-developed and well-nourished. She is cooperative. No distress.  HENT:  Head: Normocephalic.  Cardiovascular: Normal rate, regular rhythm and intact distal pulses.  Murmur heard.  Systolic murmur is present with a grade of 2/6. Pulmonary/Chest: Effort normal and breath sounds normal.  Musculoskeletal:       Lumbar back: She exhibits decreased range of motion, tenderness, bony tenderness and pain. She exhibits no swelling, no edema, no deformity, no  laceration, no spasm and normal pulse.       Back:  Neurological: She is alert. She has normal strength and normal reflexes. No sensory deficit.  Skin: Skin is warm, dry and intact. Capillary refill takes less than 2 seconds.  Psychiatric: She has a normal mood and affect. Her speech is normal and behavior is normal. Judgment and thought content normal. Cognition and memory are normal.  Nursing note and vitals reviewed.   Results for orders placed or performed in visit on 11/08/18  Urine Culture  Result Value Ref Range   Urine Culture, Routine Final report (A)    Organism ID, Bacteria Escherichia coli (A)    Antimicrobial Susceptibility Comment   Microscopic Examination  Result Value Ref Range   WBC, UA 6-10 (A) 0 - 5 /hpf   RBC, UA 3-10 (A) 0 - 2 /hpf   Epithelial Cells (non renal) 0-10 0 - 10 /hpf   Renal Epithel, UA None seen None seen /hpf   Bacteria, UA Many (A) None seen/Few   Yeast, UA Present None seen  CBC with Differential/Platelet  Result Value Ref Range   WBC 7.0 3.4 - 10.8 x10E3/uL   RBC 4.67 3.77 - 5.28 x10E6/uL   Hemoglobin 13.2 11.1 - 15.9 g/dL   Hematocrit 41.4 34.0 - 46.6 %   MCV 89 79 - 97 fL   MCH 28.3 26.6 - 33.0 pg   MCHC 31.9 31.5 - 35.7 g/dL   RDW 12.7 12.3 - 15.4 %   Platelets 285 150 - 450 x10E3/uL   Neutrophils 61 Not Estab. %   Lymphs 28 Not Estab. %   Monocytes 9 Not Estab. %   Eos 1 Not Estab. %   Basos 1 Not Estab. %   Neutrophils Absolute 4.3 1.4 - 7.0 x10E3/uL   Lymphocytes Absolute 2.0 0.7 - 3.1 x10E3/uL   Monocytes Absolute 0.6 0.1 - 0.9 x10E3/uL   EOS (ABSOLUTE) 0.1 0.0 - 0.4 x10E3/uL   Basophils Absolute 0.0 0.0 - 0.2 x10E3/uL   Immature Granulocytes 0 Not Estab. %   Immature Grans (Abs) 0.0 0.0 - 0.1 x10E3/uL  CMP14+EGFR  Result Value Ref Range   Glucose 104 (H) 65 - 99 mg/dL   BUN 10 8 - 27 mg/dL   Creatinine, Ser 0.73 0.57 - 1.00  mg/dL   GFR calc non Af Amer 80 >59 mL/min/1.73   GFR calc Af Amer 92 >59 mL/min/1.73    BUN/Creatinine Ratio 14 12 - 28   Sodium 140 134 - 144 mmol/L   Potassium 4.5 3.5 - 5.2 mmol/L   Chloride 97 96 - 106 mmol/L   CO2 26 20 - 29 mmol/L   Calcium 10.2 8.7 - 10.3 mg/dL   Total Protein 7.3 6.0 - 8.5 g/dL   Albumin 4.2 3.5 - 4.8 g/dL   Globulin, Total 3.1 1.5 - 4.5 g/dL   Albumin/Globulin Ratio 1.4 1.2 - 2.2   Bilirubin Total 0.2 0.0 - 1.2 mg/dL   Alkaline Phosphatase 97 39 - 117 IU/L   AST 16 0 - 40 IU/L   ALT 13 0 - 32 IU/L  Microalbumin / creatinine urine ratio  Result Value Ref Range   Creatinine, Urine 83.0 Not Estab. mg/dL   Microalbumin, Urine 31.4 Not Estab. ug/mL   Microalb/Creat Ratio 37.8 (H) 0.0 - 30.0 mg/g creat  Lipid panel  Result Value Ref Range   Cholesterol, Total 165 100 - 199 mg/dL   Triglycerides 143 0 - 149 mg/dL   HDL 47 >39 mg/dL   VLDL Cholesterol Cal 29 5 - 40 mg/dL   LDL Calculated 89 0 - 99 mg/dL   Chol/HDL Ratio 3.5 0.0 - 4.4 ratio  TSH  Result Value Ref Range   TSH 1.010 0.450 - 4.500 uIU/mL  Bayer DCA Hb A1c Waived  Result Value Ref Range   HB A1C (BAYER DCA - WAIVED) 6.1 <7.0 %  Urinalysis, Complete  Result Value Ref Range   Specific Gravity, UA 1.020 1.005 - 1.030   pH, UA 6.0 5.0 - 7.5   Color, UA Yellow Yellow   Appearance Ur Clear Clear   Leukocytes, UA 1+ (A) Negative   Protein, UA Negative Negative/Trace   Glucose, UA Negative Negative   Ketones, UA Negative Negative   RBC, UA Trace (A) Negative   Bilirubin, UA Negative Negative   Urobilinogen, Ur 0.2 0.2 - 1.0 mg/dL   Nitrite, UA Positive (A) Negative   Microscopic Examination See below:      X-Ray: Lumbar spine: No acute findings. Preliminary x-ray reading by Monia Pouch, FNP-C, [facility].   Pertinent labs & imaging results that were available during my care of the patient were reviewed by me and considered in my medical decision making.  Assessment & Plan:  Glynis was seen today for urinary tract infection.  Diagnoses and all orders for this visit:  Lumbar back  pain Pt with atraumatic lumbar back pain. Xray with office without acute findings. Pt states she is able to tolerate IM Toradol but not oral NSAIDS. Will dose with Toradol in office and refer to ortho for continue lower back pain. Pt declined PT referral.  -     DG Lumbar Spine 2-3 Views; Future -     ketorolac (TORADOL) injection 60 mg -     Ambulatory referral to Orthopedic Surgery  History of UTI Urinalysis in office: trace blood, 1+ leukocytes, and negative nitrites. Will culture and treat if warranted.  -     Urinalysis, Complete -     Urine Culture      Continue all other maintenance medications.  Follow up plan: Return if symptoms worsen or fail to improve.  Educational handout given for back pain, back exercises   The above assessment and management plan was discussed with the patient. The patient verbalized understanding of  and has agreed to the management plan. Patient is aware to call the clinic if symptoms persist or worsen. Patient is aware when to return to the clinic for a follow-up visit. Patient educated on when it is appropriate to go to the emergency department.   Monia Pouch, FNP-C [facility] [phone]

## 2018-11-22 ENCOUNTER — Other Ambulatory Visit: Payer: Self-pay | Admitting: Family Medicine

## 2018-11-22 DIAGNOSIS — N390 Urinary tract infection, site not specified: Secondary | ICD-10-CM

## 2018-11-22 DIAGNOSIS — B9620 Unspecified Escherichia coli [E. coli] as the cause of diseases classified elsewhere: Secondary | ICD-10-CM

## 2018-11-22 LAB — URINE CULTURE

## 2018-11-22 MED ORDER — CIPROFLOXACIN HCL 500 MG PO TABS: 500.0000 mg | ORAL_TABLET | Freq: Two times a day (BID) | ORAL | 0 refills | Status: AC

## 2018-11-22 NOTE — Addendum Note (Signed)
Addended by: Denyce Robert on: 11/22/2018 06:15 PM   Modules accepted: Miquel Dunn

## 2018-11-25 ENCOUNTER — Telehealth: Payer: Self-pay | Admitting: *Deleted

## 2018-11-25 NOTE — Telephone Encounter (Signed)
Osteoporosis Management  Patient prescribed Evenity for osteoporosis treatment. Summary of benefits from [place] received. Medication is covered at 100% through the medical benefit if purchased through the office. No summary provided for medical benefit if purchased from the pharmacy. No prior authorization required for the medical benefit coverage either. The problem is that our office does not purchase and bill for Evenity due to the high cost of the medication.   Coverage through the prescription benefit required a prior authorization which was denied. Even if approved I would be extremely reluctant to order it through the pharmacy benefit because of the high cost of the medication and the affect if would have on her benefits and the "dougnut" hole.   Discussed with patient. She is changing insurances at the beginning of the year. Since this is a year long medication and we aren't sure how it will be covered next year, then she decided to wait until January and start this process over with her new insurance.   Plan I will perform a new insurance verification after 12/25/2018 and reach back out to the patient at that time. I will update her PCP with this information.   [name], RN-BC, BSN

## 2018-11-26 DIAGNOSIS — S22088A Other fracture of T11-T12 vertebra, initial encounter for closed fracture: Secondary | ICD-10-CM | POA: Diagnosis not present

## 2018-11-28 ENCOUNTER — Telehealth: Payer: Self-pay | Admitting: Family Medicine

## 2018-11-28 ENCOUNTER — Other Ambulatory Visit: Payer: Medicare Other

## 2018-11-28 NOTE — Telephone Encounter (Signed)
Pt states she has taken all the antibiotics but her back is still hurting her. Advised her to come in and leave [place] a urine sample so we can check it and if her urine is clear she may ntbs for further evaluation and pt voiced understanding.

## 2018-11-28 NOTE — Telephone Encounter (Signed)
No answer, just kept ringing.

## 2018-11-29 ENCOUNTER — Other Ambulatory Visit: Payer: Medicare Other

## 2018-11-29 ENCOUNTER — Other Ambulatory Visit: Payer: Self-pay | Admitting: Family Medicine

## 2018-11-29 DIAGNOSIS — N390 Urinary tract infection, site not specified: Secondary | ICD-10-CM

## 2018-11-29 DIAGNOSIS — B9620 Unspecified Escherichia coli [E. coli] as the cause of diseases classified elsewhere: Secondary | ICD-10-CM

## 2018-11-29 LAB — URINALYSIS, COMPLETE
Bilirubin, UA: NEGATIVE
Glucose, UA: NEGATIVE
Ketones, UA: NEGATIVE
Nitrite, UA: NEGATIVE
Protein, UA: NEGATIVE
RBC, UA: NEGATIVE
Specific Gravity, UA: 1.01 (ref 1.005–1.030)
Urobilinogen, Ur: 0.2 mg/dL (ref 0.2–1.0)
pH, UA: 6 (ref 5.0–7.5)

## 2018-11-29 LAB — MICROSCOPIC EXAMINATION
RBC, UA: NONE SEEN /hpf (ref 0–2)
Renal Epithel, UA: NONE SEEN /hpf

## 2018-12-02 ENCOUNTER — Other Ambulatory Visit: Payer: Self-pay | Admitting: Family Medicine

## 2018-12-02 ENCOUNTER — Other Ambulatory Visit: Payer: Medicare Other

## 2018-12-02 DIAGNOSIS — B379 Candidiasis, unspecified: Secondary | ICD-10-CM

## 2018-12-02 DIAGNOSIS — N390 Urinary tract infection, site not specified: Secondary | ICD-10-CM | POA: Diagnosis not present

## 2018-12-02 DIAGNOSIS — Z87440 Personal history of urinary (tract) infections: Secondary | ICD-10-CM

## 2018-12-02 DIAGNOSIS — B9620 Unspecified Escherichia coli [E. coli] as the cause of diseases classified elsewhere: Secondary | ICD-10-CM | POA: Diagnosis not present

## 2018-12-02 LAB — URINALYSIS, COMPLETE
Bilirubin, UA: NEGATIVE
Glucose, UA: NEGATIVE
Ketones, UA: NEGATIVE
Nitrite, UA: NEGATIVE
Protein, UA: NEGATIVE
RBC, UA: NEGATIVE
Specific Gravity, UA: 1.015 (ref 1.005–1.030)
Urobilinogen, Ur: 0.2 mg/dL (ref 0.2–1.0)
pH, UA: 7 (ref 5.0–7.5)

## 2018-12-02 LAB — MICROSCOPIC EXAMINATION: Renal Epithel, UA: NONE SEEN /hpf

## 2018-12-02 MED ORDER — FLUCONAZOLE 150 MG PO TABS: 150.0000 mg | ORAL_TABLET | Freq: Once | ORAL | 0 refills | Status: AC

## 2018-12-02 NOTE — Progress Notes (Signed)
Pt with complaints of vaginal irritation and white discharge after antibiotic therapy.

## 2018-12-04 LAB — URINE CULTURE

## 2018-12-06 ENCOUNTER — Telehealth: Payer: Self-pay | Admitting: Family Medicine

## 2018-12-06 MED ORDER — FLUCONAZOLE 150 MG PO TABS: 150.0000 mg | ORAL_TABLET | Freq: Once | ORAL | 0 refills | Status: AC

## 2018-12-06 NOTE — Telephone Encounter (Signed)
Pt aware of results but requests another diflucan because dr Wendi Snipes always sent in 2 because 1 never got rid of her yeast infection. Advised rx sent to pharmacy as requested.

## 2018-12-20 ENCOUNTER — Telehealth: Payer: Self-pay | Admitting: Family Medicine

## 2018-12-20 DIAGNOSIS — E1159 Type 2 diabetes mellitus with other circulatory complications: Secondary | ICD-10-CM

## 2018-12-20 DIAGNOSIS — E039 Hypothyroidism, unspecified: Secondary | ICD-10-CM

## 2018-12-20 DIAGNOSIS — I10 Essential (primary) hypertension: Secondary | ICD-10-CM

## 2018-12-20 DIAGNOSIS — I152 Hypertension secondary to endocrine disorders: Secondary | ICD-10-CM

## 2018-12-20 MED ORDER — LEVOTHYROXINE SODIUM 100 MCG PO TABS: ORAL_TABLET | ORAL | 1 refills | Status: DC

## 2018-12-20 MED ORDER — AMLODIPINE BESYLATE 10 MG PO TABS: 10.0000 mg | ORAL_TABLET | Freq: Every day | ORAL | 1 refills | Status: DC

## 2018-12-20 NOTE — Telephone Encounter (Signed)
Pt is needing refills on amLODipine (NORVASC) 10 MG tablet levothyroxine (SYNTHROID, LEVOTHROID) 100 MCG tablet   Pharmacy: [facility]

## 2018-12-25 HISTORY — PX: EYE SURGERY: SHX253

## 2018-12-31 ENCOUNTER — Other Ambulatory Visit: Payer: Self-pay | Admitting: Family Medicine

## 2018-12-31 DIAGNOSIS — E1159 Type 2 diabetes mellitus with other circulatory complications: Secondary | ICD-10-CM

## 2018-12-31 DIAGNOSIS — S22088A Other fracture of T11-T12 vertebra, initial encounter for closed fracture: Secondary | ICD-10-CM | POA: Diagnosis not present

## 2018-12-31 DIAGNOSIS — E785 Hyperlipidemia, unspecified: Secondary | ICD-10-CM

## 2018-12-31 DIAGNOSIS — I10 Essential (primary) hypertension: Principal | ICD-10-CM

## 2018-12-31 DIAGNOSIS — R030 Elevated blood-pressure reading, without diagnosis of hypertension: Secondary | ICD-10-CM | POA: Diagnosis not present

## 2018-12-31 DIAGNOSIS — I152 Hypertension secondary to endocrine disorders: Secondary | ICD-10-CM

## 2018-12-31 DIAGNOSIS — E7800 Pure hypercholesterolemia, unspecified: Secondary | ICD-10-CM

## 2018-12-31 DIAGNOSIS — Z6833 Body mass index (BMI) 33.0-33.9, adult: Secondary | ICD-10-CM | POA: Diagnosis not present

## 2018-12-31 DIAGNOSIS — E1169 Type 2 diabetes mellitus with other specified complication: Secondary | ICD-10-CM

## 2018-12-31 DIAGNOSIS — I5032 Chronic diastolic (congestive) heart failure: Secondary | ICD-10-CM

## 2019-01-01 ENCOUNTER — Ambulatory Visit (INDEPENDENT_AMBULATORY_CARE_PROVIDER_SITE_OTHER): Payer: Medicare Other

## 2019-01-01 ENCOUNTER — Ambulatory Visit (INDEPENDENT_AMBULATORY_CARE_PROVIDER_SITE_OTHER): Payer: Medicare Other | Admitting: Family Medicine

## 2019-01-01 VITALS — BP 156/84 | HR 84 | Temp 98.2°F | Ht 63.0 in | Wt 176.0 lb

## 2019-01-01 DIAGNOSIS — M25511 Pain in right shoulder: Secondary | ICD-10-CM

## 2019-01-01 DIAGNOSIS — G8929 Other chronic pain: Secondary | ICD-10-CM | POA: Diagnosis not present

## 2019-01-01 DIAGNOSIS — M545 Low back pain, unspecified: Secondary | ICD-10-CM

## 2019-01-01 MED ORDER — METHYLPREDNISOLONE ACETATE 80 MG/ML IJ SUSP
80.0000 mg | Freq: Once | INTRAMUSCULAR | Status: AC
  Administered 2019-01-01: 80 mg via INTRAMUSCULAR

## 2019-01-01 NOTE — Patient Instructions (Signed)
You had multiple concerns today and I do recommend that you follow-up with Monia Pouch, your primary care provider, for other chronic issues.  For your back and shoulder pain, you were given a dose of Depo-Medrol intramuscularly today.  We are checking an x-ray of your shoulder and I have referred you to physical therapy [place].  You should see her back in the next 2 to 4 weeks for recheck.  Schedule an appointment with your cardiologist in [place] for other cardiac needs.

## 2019-01-01 NOTE — Progress Notes (Signed)
Subjective: CC: Low back pain/right shoulder pain PCP: Baruch Gouty, FNP AYT:KZSW Jerilynn Mages Chill is a 78 y.o. female presenting to clinic today for:  1.  Low back pain Patient reports a 43-month history of bilateral low back pain.  She reports that it extends across the entire low back in is constant.  Pain is refractory to gabapentin, heat and intermittent use of Norco.  She denies any lower extremity weakness, numbness, tingling or preceding injury to the low back but does report history of recent fracture and other vertebral segments.  She was seeing an orthopedist for this but was released from their care yesterday.  Physical therapy was recommended but she is not sure that this would be especially helpful.  Denies any saddle anesthesia, fecal incontinence, urinary retention.  2.  Right shoulder pain Patient reports a 2-week history of right shoulder pain that is a constant aching sensation extending down the lateral aspect of the right upper arm.  Denies any associated numbness or tingling.  No upper extremity weakness.  Pain is exacerbated by any movements which require her to raise her right upper extremity.  Again, use of Norco, heat and gabapentin are not especially helpful.   ROS: Per HPI  Allergies  Allergen Reactions  . Crestor [Rosuvastatin] Other (See Comments)    Body aches.   . Nsaids     Burns stomach.   . Penicillins Hives    Has patient had a PCN reaction causing immediate rash, facial/tongue/throat swelling, SOB or lightheadedness with hypotension: Yes Has patient had a PCN reaction causing severe rash involving mucus membranes or skin necrosis: unknown Has patient had a PCN reaction that required hospitalization No Has patient had a PCN reaction occurring within the last 10 years: No If all of the above answers are "NO", then may proceed with Cephalosporin use.   . Vytorin [Ezetimibe-Simvastatin]     Body aches.   . Tolmetin Nausea Only    Burns stomach.    Past  Medical History:  Diagnosis Date  . Anxiety   . Arthritis   . CHF (congestive heart failure) ([place])   . Chronic pain of multiple joints   . Depression   . Essential hypertension   . GERD (gastroesophageal reflux disease)   . Hyperlipidemia   . Hyperlipidemia associated with type 2 diabetes mellitus ([place])   . Hypertension associated with type 2 diabetes mellitus ([place])   . Hypothyroidism   . Osteoporosis   . Sleep apnea    CPAP  . Type 2 diabetes mellitus with hypercholesterolemia ([place])   . Vitamin D deficiency     Current Outpatient Medications:  .  amLODipine (NORVASC) 10 MG tablet, Take 1 tablet (10 mg total) by mouth daily., Disp: 90 tablet, Rfl: 1 .  aspirin 81 MG chewable tablet, Chew 4 tablets by mouth daily. , Disp: , Rfl:  .  Biotin 10 MG CAPS, Take 1 capsule by mouth daily. , Disp: , Rfl:  .  calcium-vitamin D (OSCAL WITH D) 500-200 MG-UNIT tablet, Take 1 tablet by mouth daily with breakfast., Disp: 30 tablet, Rfl: 3 .  Cholecalciferol (VITAMIN D-3 PO), Take 1 capsule by mouth daily. , Disp: , Rfl:  .  diclofenac sodium (VOLTAREN) 1 % GEL, Apply 2 g topically 4 (four) times daily., Disp: 100 g, Rfl: 5 .  diphenhydrAMINE (BENADRYL) 25 MG tablet, Take 50 mg by mouth at bedtime as needed for allergies. , Disp: , Rfl:  .  Ferrous Sulfate 90 (18 FE)  MG TABS, Take 1 tablet by mouth every morning., Disp: 90 each, Rfl: 1 .  gabapentin (NEURONTIN) 100 MG capsule, Take 1 capsule (100 mg total) by mouth 4 (four) times daily as needed., Disp: 120 capsule, Rfl: 1 .  HYDROcodone-acetaminophen (NORCO/VICODIN) 5-325 MG tablet, Take 1-2 tablets by mouth every 4 (four) hours as needed for severe pain., Disp: 20 tablet, Rfl: 0 .  ketoconazole (NIZORAL) 2 % cream, Apply 1 application topically daily., Disp: 60 g, Rfl: 2 .  levothyroxine (SYNTHROID, LEVOTHROID) 100 MCG tablet, Take 1 Tablet by mouth once daily BEFORE BREAKFAST, Disp: 90 tablet, Rfl: 1 .  lovastatin (MEVACOR) 40 MG tablet, Take 1  tablet (40 mg total) by mouth at bedtime., Disp: 90 tablet, Rfl: 3 .  Multiple Vitamins-Minerals (MULTIVITAMIN ADULT) CHEW, Chew 2 tablets by mouth daily., Disp: , Rfl:  .  mupirocin ointment (BACTROBAN) 2 %, Apply 1 application topically 2 (two) times daily., Disp: 22 g, Rfl: 0 .  nystatin (MYCOSTATIN/NYSTOP) powder, Apply topically 4 (four) times daily., Disp: 15 g, Rfl: 0 .  nystatin cream (MYCOSTATIN), Apply 1 application topically 2 (two) times daily., Disp: 30 g, Rfl: PRN .  pyridOXINE (VITAMIN B-6) 100 MG tablet, Take 100 mg by mouth daily., Disp: , Rfl:  .  ranitidine (ZANTAC) 150 MG tablet, Take 1 tablet (150 mg total) by mouth 2 (two) times daily., Disp: 60 tablet, Rfl: 3 .  Romosozumab-aqqg (EVENITY) 105 MG/1.17ML SOSY, Inject 210 mg into the skin every 30 (thirty) days for 12 doses., Disp: 2.4 mL, Rfl: 11 .  sertraline (ZOLOFT) 100 MG tablet, Take 1.5 tablets (150 mg total) by mouth every morning., Disp: 135 tablet, Rfl: 3 .  sodium chloride (OCEAN) 0.65 % SOLN nasal spray, Place 1 spray into both nostrils as needed for congestion., Disp: 1 Bottle, Rfl: prn Social History   Socioeconomic History  . Marital status: Widowed    Spouse name: Laverna Peace  . Number of children: 2  . Years of education: Not on file  . Highest education level: 12th grade  Occupational History  . Occupation: CNA    Comment: [employer]  . Financial resource strain: Very hard  . Food insecurity:    Worry: Sometimes true    Inability: Sometimes true  . Transportation needs:    Medical: No    Non-medical: No  Tobacco Use  . Smoking status: Never Smoker  . Smokeless tobacco: Never Used  Substance and Sexual Activity  . Alcohol use: No  . Drug use: No  . Sexual activity: Not Currently  Lifestyle  . Physical activity:    Days per week: 0 days    Minutes per session: 0 min  . Stress: Not at all  Relationships  . Social connections:    Talks on phone: More than three times a week     Gets together: More than three times a week    Attends religious service: Never    Active member of club or organization: No    Attends meetings of clubs or organizations: Never    Relationship status: Widowed  . Intimate partner violence:    Fear of current or ex partner: No    Emotionally abused: No    Physically abused: No    Forced sexual activity: No  Other Topics Concern  . Not on file  Social History Narrative  . Not on file   Family History  Problem Relation Age of Onset  . Hypertension Mother   .  Stroke Mother   . Cancer Father     Objective: Office vital signs reviewed. BP (!) 156/84   Pulse 84   Temp 98.2 F (36.8 C) (Oral)   Ht 5\' 3"  (1.6 m)   Wt 176 lb (79.8 kg)   BMI 31.18 kg/m   Physical Examination:  General: Awake, alert, No acute distress Cardio: regular rate and rhythm, S1S2 heard, no murmurs appreciated Pulm: clear to auscultation bilaterally, no wheezes, rhonchi or rales; normal work of breathing on room air Extremities: warm, well perfused, No edema, cyanosis or clubbing; +2 pulses bilaterally MSK: normal gait and hunched station  Thoracic spine: Prominent kyphotic curve noted.  No palpable bony abnormalities otherwise.  Lumbar spine: Active range of motion is limited secondary to pain.  She has no midline tenderness palpation.  No paraspinal muscle tenderness to palpation.  No palpable bony abnormalities.  She has full active range of motion of bilateral lower extremities.  Lower extremity strength is preserved and is 5 out of 5 bilaterally.  Light touch sensation grossly intact bilaterally as well.  Negative seated straight leg raise.  Right shoulder: Patient has full active range of motion bilaterally.  She does have pain with abduction of the right shoulder and internal rotation of the shoulder.  There are no palpable bony abnormalities or soft tissue defects.  She does have some atrophy of bilateral shoulder muscles.  No tenderness palpation to the  shoulder.  She has mild weakness with empty can testing.  She has mild pain with Hawkins test. Neuro: Sensation intact as above.  No results found.   Assessment/ Plan: 78 y.o. female   1. Chronic bilateral low back pain without sciatica I have recommended that she proceed with physical therapy and placed an order for her to be seen here in [place].  Depo-Medrol 80 mg administered intramuscularly today.  Okay to continue home care that has been prescribed by her PCP.  May need to consider further evaluation of the lumbar spine but I am unsure up to this point what has been actually done for the patient by her specialist.  Will defer further management of this to PCP. - methylPREDNISolone acetate (DEPO-MEDROL) injection 80 mg - Ambulatory referral to Physical Therapy  2. Acute pain of right shoulder X-ray was obtained to further evaluate.  While there were no visible abnormalities on exam, personal view of x-ray does not demonstrate fracture or dislocation.  I do question partial rotator cuff tear versus tendinopathy.  She was given a dose of Depo-Medrol 80 here in office and referred to physical therapy.  We also discussed consideration for referral back to orthopedics.  I will defer this to her PCP, who have asked her to follow-up within the next 2 to 4 weeks.  Okay to continue home care medications prescribed by PCP. - methylPREDNISolone acetate (DEPO-MEDROL) injection 80 mg - Ambulatory referral to Physical Therapy - DG Shoulder Right; Future   Orders Placed This Encounter  Procedures  . DG Shoulder Right    Standing Status:   Future    Number of Occurrences:   1    Standing Expiration Date:   03/01/2020    Order Specific Question:   Reason for Exam (SYMPTOM  OR DIAGNOSIS REQUIRED)    Answer:   2 week hx of right shoulder pain without injury    Order Specific Question:   Preferred imaging location?    Answer:   Internal    Order Specific Question:   Radiology Contrast Protocol -  do NOT  remove file path    Answer:   [email or web address]  . Ambulatory referral to Physical Therapy    Referral Priority:   Routine    Referral Type:   Physical Medicine    Referral Reason:   Specialty Services Required    Requested Specialty:   Physical Therapy    Number of Visits Requested:   1   Meds ordered this encounter  Medications  . methylPREDNISolone acetate (DEPO-MEDROL) injection 80 mg   Additionally, patient had many other concerns that she wanted discussed during today's visit.  However, I did explain to her that today was an acute care visit and did not allow for her many other chronic concerns.  I have asked that she follow-up with her PCP to further discuss any additional medical concerns.  Janora Norlander, DO [place] [phone]

## 2019-01-03 ENCOUNTER — Ambulatory Visit
Admission: RE | Admit: 2019-01-03 | Discharge: 2019-01-03 | Disposition: A | Discharge status: Home / Self Care | Payer: Medicare Other | Source: Ambulatory Visit | Attending: Family Medicine | Admitting: Family Medicine

## 2019-01-03 DIAGNOSIS — Z1231 Encounter for screening mammogram for malignant neoplasm of breast: Secondary | ICD-10-CM | POA: Diagnosis not present

## 2019-01-08 ENCOUNTER — Encounter: Payer: Self-pay | Admitting: *Deleted

## 2019-01-08 NOTE — Progress Notes (Signed)
Patient prescribed Evenity for osteoporosis with current vertebral fracture. She opted to wait until after the first of the year since her insurance changed from a medigap policy to an advantage plan.   Resubmitted insurance verification on [employer] website today. Will await coverage information before proceeding.   [name], RN-BC, BSN Nurse Case Manager [facility] Ph: [phone]

## 2019-01-09 ENCOUNTER — Other Ambulatory Visit: Payer: Self-pay

## 2019-01-09 DIAGNOSIS — M2550 Pain in unspecified joint: Principal | ICD-10-CM

## 2019-01-09 DIAGNOSIS — G8929 Other chronic pain: Secondary | ICD-10-CM

## 2019-01-09 MED ORDER — GABAPENTIN 100 MG PO CAPS: 100.0000 mg | ORAL_CAPSULE | Freq: Four times a day (QID) | ORAL | 0 refills | Status: DC | PRN

## 2019-01-15 ENCOUNTER — Ambulatory Visit: Payer: Medicare Other | Admitting: Physical Therapy

## 2019-01-16 ENCOUNTER — Telehealth: Payer: Medicare Other

## 2019-01-20 ENCOUNTER — Ambulatory Visit (INDEPENDENT_AMBULATORY_CARE_PROVIDER_SITE_OTHER): Payer: Medicare Other | Admitting: *Deleted

## 2019-01-20 DIAGNOSIS — I10 Essential (primary) hypertension: Secondary | ICD-10-CM

## 2019-01-20 DIAGNOSIS — I5032 Chronic diastolic (congestive) heart failure: Secondary | ICD-10-CM

## 2019-01-20 DIAGNOSIS — E1159 Type 2 diabetes mellitus with other circulatory complications: Secondary | ICD-10-CM | POA: Diagnosis not present

## 2019-01-20 DIAGNOSIS — M8008XS Age-related osteoporosis with current pathological fracture, vertebra(e), sequela: Secondary | ICD-10-CM

## 2019-01-20 DIAGNOSIS — G8929 Other chronic pain: Secondary | ICD-10-CM

## 2019-01-20 DIAGNOSIS — I700 Atherosclerosis of aorta: Secondary | ICD-10-CM

## 2019-01-20 DIAGNOSIS — M2550 Pain in unspecified joint: Secondary | ICD-10-CM

## 2019-01-20 DIAGNOSIS — E1169 Type 2 diabetes mellitus with other specified complication: Secondary | ICD-10-CM | POA: Diagnosis not present

## 2019-01-20 DIAGNOSIS — E7800 Pure hypercholesterolemia, unspecified: Secondary | ICD-10-CM

## 2019-01-20 DIAGNOSIS — F320 Major depressive disorder, single episode, mild: Secondary | ICD-10-CM

## 2019-01-20 NOTE — Chronic Care Management (AMB) (Signed)
Chronic Care Management   Initial Visit Note  01/20/2019 Name: Danielle Parrish MRN: [id number] DOB: [birth year]  Referred by: Baruch Gouty, FNP Reason for referral : Chronic Care Management (Initial Telephone Call)  Danielle Parrish is a 78 year old female primary care patient of Darla Lesches, [place] who was referred for CCM services due to DM, HTN, heart failure, chronic pain, osteoporosis, and CAD.   Subjective: Danielle Parrish lives at home home alone since her husband passed away in Feb 11, 2007. She has two adult children. One lives locally in [place] and the other in [place]. She is independent and performs ADLs on her own at this time. She states that she was seen for right shoulder pain a few weeks ago that has since resolved. There was no known injury and no associated symptoms. Hx of right humeral fracture. She has chronic joint pain and lower back pain and she has vicodin that she takes as needed for pain management.  She also tries conservative measures like heat application. She was referred to PT for lower back pain but has declined to schedule due to copay cost. She is willing to do home exercises if given proper instruction. She requested a test to be done at her next visit to see how she responds to mold and cigarette smoke. When asked why she would like theses tests, she did not provide a direct answer, but from our discussion it seems that she believes she is being exposed to mold in her home and she has visitors that smoke around her and in her home. She didn't feel that "No Smoking" signage or verbal requests would make them stop.   Objective:  Fall Risk  01/20/2019 01/01/2019 11/19/2018 11/08/2018  Falls in the past year? _0 Number falls in past yr: 1 0 1 1  Injury with Fall? _1 Comment - - - -  Risk Factor Category  - - - -  Risk for fall due to : History of fall(s);Medication side effect;Impaired balance/gait - - -  Risk for fall due to: Comment - - - -  Follow up Falls  prevention discussed - - -  Danielle Laban is at an increased risk for falls  BP Readings from Last 3 Encounters:  01/01/19 (!) 156/84  11/19/18 136/70  11/08/18 131/87    Lab Results  Component Value Date   HGBA1C 6.1 11/08/2018   HGBA1C 5.8 04/10/2018   HGBA1C 6.2 10/18/2017   Lab Results  Component Value Date   LDLCALC February 12, 1989 11/08/2018   CREATININE 0.73 11/08/2018    Dexa Scan Results from 10/28/2018: The BMD measured at Femur Neck Right is 0.641 g/cm2 with a T-score of -2.9.  # of ED visits last 12 months: 1 on 09/29/18 for a compression fracture   Assessment:  Danielle Parrish is at an increased risk for falls due to history of falls, medication side effects, and age related changes in balance. She is also at an increased risk for fractures do to her fall risk and osteoporosis. She would benefit from CCM to help manage osteoporosis, DM, HTN, heart disease, heart failure, chronic pain, and polypharmacy.   Review of patient status, including review of consultants reports, relevant laboratory and other test results, and collaboration with appropriate care team members and the patient's provider was performed as part of comprehensive patient evaluation and provision of chronic care management services.     Goals Addressed    . "I need help  with managing osteoporosis" (pt-stated)       Current Barriers:  . Cost of treatment . Insurance requirements for coverage  Nurse Case Manager Clinical Goal(s): Over the next 30 days patient will verbalize understanding of the plan of care for osteoporosis management which now includes an injectable medication, Prolia, that is given in office every 6 months. She will also verbalize understanding of the insurance coverage for Prolia, the cost of the medication, prescription assistance options, and general guidelines regarding treatment.  Interventions:  . Insurance verification submitted . Will review insurance coverage and out of pocket cost with  patient at office visit on 01/30/19 . Discussed that Evenity had been prescribed but that the cost is prohibitive . Benefits of treatment discussed . Side effects of treatment discussed . Handouts to be given at office visit on 01/30/2019 . Review medication requirements including appropriate Vit D and Calcium intake required  Patient Self Care Activities:  . Independent ADLs . Takes medication on her own  *initial goal documentation     . "I need to make sure that my heart is still doing ok" (pt-stated)       Current Barriers:   Knowledge deficit related to management of heart disease and heart failure  Knowledge deficit related to the s/s of heart attack in women  Nurse Case Manager Clinical Goal(s): Over the next 30 days patient will verbalize understanding of the signs & symptoms of heart disease, heart failure, and heart attacks in women.  Interventions:  . Assessed patient's knowledge of heart failure and heart attack . Discussed s/s . Noted previous referral to cardiology but no patient visit . Explained the importance of establishing care with a cardiologist even in the absence of symptoms . Reviewed previous cardiac test results . Will collaborate with PCP to place cardiac referral  . F/u on referral in 1 month  Patient Self Care Activities:  . Able to describe symptoms . Independent for ADLs at this time . Can drive to appointments  *initial goal documentation    . "I want to keep my pain under control"       Current Barriers:  . Lack of knowledge related to non narcotic pain management strategies . Cost of physical therapy copay  Nurse Case Manager Clinical Goal(s): Over the next 30 days patient will verbalize understanding of strategies to manage chronic pain with very limited, or no, use of prescription narcotics.   Interventions:  . Discussed physical therapy referral. Patient advsided that copay is to high to go for multiple visits. . Recommended a couple of  sessions with them so that they could do an assessment and possible form a home treatment plan that she could do on her own . No significant mobility issues and she is not home bound at this time. Would not qualify for Home Health services.  . Home Exercise handouts printed and she will pick them up at her office visit on 01/30/19 . Recommended alternating heat and/or ice for 20 minutes at a time several times a day . Medication list reviewed . Talked with patient about how she currently takes Vicodin. She only uses as needed and may not take it for several days in a row.   Patient Self Care Activities:  . Takes medication on her own . Independent ADLs  *initial goal documentation    . "I want to manage my breathing better" (pt-stated)       Current Barriers:  . Lack of family and/or visitor support  Nurse Case Manager Clinical Goal(s): Over the next 30 days patient will be able to verbalize understanding of the effects that different environmental factors have on her respiratory system.   Interventions:  . Talked with patient about her concerns and provided general education on the role environment plays in respiratory disease . Asked her to note symptoms as they happen and what factors could be causing them. Keep a log. . Reviewed patient's health record . Answered questions about available testing . Collaboration with PCP to discuss referral to allergist and in house spirometry testing to assess lung function . Patient should tell family and visitors that smoking is not allowed in her home. Signs can be placed at the entrance.   Patient Self Care Activities:  . Independent with ADLs  *initial goal documentation       Plan Next PCP appointment scheduled for: January 30, 2019.  I plan to talk with patient while she is in the office on 01/30/19 I will provide her with handouts related to the goals we discussed today Ongoing collaboration with patient, PCP, and other providers to  provide CCM services Consider referral to [facility] Pharmacist for polypharmacy medication review Consider LCSW referral to help with depression/anxiety  Danielle. Mcdonell was given information about Chronic Care Management services today including:  1. CCM service includes personalized support from designated clinical staff supervised by her physician, including individualized plan of care and coordination with other care providers 2. 24/7 contact phone numbers for assistance for urgent and routine care needs. 3. Service will only be billed when office clinical staff spend 20 minutes or more in a month to coordinate care. 4. Only one practitioner may furnish and bill the service in a calendar month. 5. The patient may stop CCM services at any time (effective at the end of the month) by phone call to the office staff. 6. The patient will be responsible for cost sharing (co-pay) of up to 20% of the service fee (after annual deductible is met).  Patient agreed to services and verbal consent obtained.    [name], RN-BC, BSN Nurse Case Manager [facility] Ph: [phone]

## 2019-01-20 NOTE — Patient Instructions (Signed)
Visit Information  Goals Addressed            This Visit's Progress   . "I need help with managing osteoporosis" (pt-stated)       Current Barriers:  . Cost of treatment . Insurance requirements for coverage  Nurse Case Manager Clinical Goal(s): Over the next 30 days patient will verbalize understanding of the plan of care for osteoporosis management which now includes an injectable medication, Prolia, that is given in office every 6 months. She will also verbalize understanding of the insurance coverage for Prolia, the cost of the medication, prescription assistance options, and general guidelines regarding treatment.  Interventions:  . Insurance verification submitted . Will review insurance coverage and out of pocket cost with patient at office visit on 01/30/19 . Discussed that Evenity had been prescribed but that the cost is prohibitive . Benefits of treatment discussed . Side effects of treatment discussed . Handouts to be given at office visit on 01/30/2019 . Review medication requirements including appropriate Vit D and Calcium intake required  Patient Self Care Activities:  . Independent ADLs . Takes medication on her own  *initial goal documentation     . "I need to make sure that my heart is still doing ok" (pt-stated)       Current Barriers:   Knowledge deficit related to management of heart disease and heart failure  Knowledge deficit related to the s/s of heart attack in women  Nurse Case Manager Clinical Goal(s): Over the next 30 days patient will verbalize understanding of the signs & symptoms of heart disease, heart failure, and heart attacks in women.  Interventions:  . Assessed patient's knowledge of heart failure and heart attack . Discussed s/s . Noted previous referral to cardiology but no patient visit . Explained the importance of establishing care with a cardiologist even in the absence of symptoms . Reviewed previous cardiac test results . Will  collaborate with PCP to place cardiac referral  . F/u on referral in 1 month  Patient Self Care Activities:  . Able to describe symptoms . Independent for ADLs at this time . Can drive to appointments  *initial goal documentation      . "I want to keep my pain under control"       Current Barriers:  . Lack of knowledge related to non narcotic pain management strategies . Cost of physical therapy copay  Nurse Case Manager Clinical Goal(s): Over the next 30 days patient will verbalize understanding of strategies to manage chronic pain with very limited, or no, use of prescription narcotics.   Interventions:  . Discussed physical therapy referral. Patient advsided that copay is to high to go for multiple visits. . Recommended a couple of sessions with them so that they could do an assessment and possible form a home treatment plan that she could do on her own . No significant mobility issues and she is not home bound at this time. Would not qualify for Home Health services.  . Home Exercise handouts printed and she will pick them up at her office visit on 01/30/19 . Recommended alternating heat and/or ice for 20 minutes at a time several times a day . Medication list reviewed . Talked with patient about how she currently takes Vicodin. She only uses as needed and may not take it for several days in a row.   Patient Self Care Activities:  . Takes medication on her own . Independent ADLs  *initial goal documentation      . "  I want to manage my breathing better" (pt-stated)       Current Barriers:  . Lack of family and/or visitor support  Nurse Case Manager Clinical Goal(s): Over the next 30 days patient will be able to verbalize understanding of the effects that different environmental factors have on her respiratory system.   Interventions:  . Talked with patient about her concerns and provided general education on the role environment plays in respiratory disease . Asked her to  note symptoms as they happen and what factors could be causing them. Keep a log. . Reviewed patient's health record . Answered questions about available testing . Collaboration with PCP to discuss referral to allergist and in house spirometry testing to assess lung function . Patient should tell family and visitors that smoking is not allowed in her home. Signs can be placed at the entrance.   Patient Self Care Activities:  . Independent with ADLs  *initial goal documentation          Education or Materials Provided:  1. Emmi handouts printed for visit on 01/30/19 2. Symptoms of heart failure and heart attack in women 3. Osteoporosis treatment options-Prolia    The patient verbalized understanding of instructions provided today and declined a print copy of patient instruction materials.    Ms. Rockman was given information about Chronic Care Management services today including:  1. CCM service includes personalized support from designated clinical staff supervised by her physician, including individualized plan of care and coordination with other care providers 2. 24/7 contact phone numbers for assistance for urgent and routine care needs. 3. Service will only be billed when office clinical staff spend 20 minutes or more in a month to coordinate care. 4. Only one practitioner may furnish and bill the service in a calendar month. 5. The patient may stop CCM services at any time (effective at the end of the month) by phone call to the office staff. 6. The patient will be responsible for cost sharing (co-pay) of up to 20% of the service fee (after annual deductible is met).  Patient agreed to services and verbal consent obtained.   Next PCP appointment scheduled for: 01/30/2019. I will talk with patient at this appointment.   [name], RN-BC, BSN Nurse Case Manager [facility] Ph: [phone]

## 2019-01-30 ENCOUNTER — Ambulatory Visit (INDEPENDENT_AMBULATORY_CARE_PROVIDER_SITE_OTHER): Payer: Medicare Other | Admitting: Family Medicine

## 2019-01-30 ENCOUNTER — Encounter: Payer: Self-pay | Admitting: Family Medicine

## 2019-01-30 VITALS — BP 168/70 | HR 56 | Temp 97.3°F | Ht 63.0 in | Wt 179.0 lb

## 2019-01-30 DIAGNOSIS — M545 Low back pain, unspecified: Secondary | ICD-10-CM | POA: Insufficient documentation

## 2019-01-30 DIAGNOSIS — F320 Major depressive disorder, single episode, mild: Secondary | ICD-10-CM | POA: Diagnosis not present

## 2019-01-30 DIAGNOSIS — G8929 Other chronic pain: Secondary | ICD-10-CM

## 2019-01-30 MED ORDER — SERTRALINE HCL 100 MG PO TABS: 200.0000 mg | ORAL_TABLET | Freq: Every day | ORAL | 3 refills | Status: DC

## 2019-01-30 MED ORDER — HYDROCODONE-ACETAMINOPHEN 5-325 MG PO TABS: 1.0000 | ORAL_TABLET | Freq: Four times a day (QID) | ORAL | 0 refills | Status: AC | PRN

## 2019-01-30 NOTE — Progress Notes (Signed)
Subjective:  Patient ID: Danielle Parrish, female    DOB: 01-Feb-1941, 78 y.o.   MRN: [id number]  Chief Complaint:  Followup low back pain (saw Dr. Lajuana Ripple one month ago)   HPI: Danielle Parrish is a 78 y.o. female presenting on 01/30/2019 for Followup low back pain (saw Dr. Lajuana Ripple one month ago)   1. Chronic midline low back pain without sciatica  Pt presents for ongoing midline low back pain. States she has not been to physical therapy as discussed. States she switched insurance and is going to try to go. States she takes the  Hydrocodone 3-4 times per week when the pain gets severe. States her pain is currently 8/10 and sharp to throbbing. She denies loss of function, weakness, numbness, tingling, or bowel or bladder incontinence.    2. Current mild episode of major depressive disorder, unspecified whether recurrent (HCC)  Currently on Zoloft 150 mg. States she feels her depression has worsened. States she is not suicidal or homicidal. States she just has increased depression.  Depression screen [facility] 2/9 01/30/2019 01/01/2019 11/19/2018 11/08/2018 10/01/2018  Decreased Interest 1 1 0 1 1  Down, Depressed, Hopeless 1 1 1 1 1   PHQ - 2 Score 2 2 1 2 2   Altered sleeping 1 0 - 0 0  Tired, decreased energy 1 1 - 0 1  Change in appetite 1 2 - 0 0  Feeling bad or failure about yourself  1 1 - 0 0  Trouble concentrating 1 1 - 1 1  Moving slowly or fidgety/restless 1 0 - 0 0  Suicidal thoughts 1 1 - 0 0  PHQ-9 Score 9 8 - 3 4  Difficult doing work/chores Not difficult at all Somewhat difficult - - -  Some recent data might be hidden        Relevant past medical, surgical, family, and social history reviewed and updated as indicated.  Allergies and medications reviewed and updated.   Past Medical History:  Diagnosis Date  . Anxiety   . Arthritis   . CHF (congestive heart failure) ([place])   . Chronic pain of multiple joints   . Depression   . Essential hypertension   . GERD  (gastroesophageal reflux disease)   . Hyperlipidemia   . Hyperlipidemia associated with type 2 diabetes mellitus ([place])   . Hypertension associated with type 2 diabetes mellitus ([place])   . Hypothyroidism   . Osteoporosis   . Sleep apnea    CPAP  . Type 2 diabetes mellitus with hypercholesterolemia ([place])   . Vitamin D deficiency     Past Surgical History:  Procedure Laterality Date  . ABDOMINAL HERNIA REPAIR    . ABDOMINAL HYSTERECTOMY  1977  . BREAST EXCISIONAL BIOPSY Right   . KNEE ARTHROSCOPY Left 09/30/2014   Procedure: LEFT KNEE ARTHROSCOPY MEDIAL MENISECTOMY ABRASION CHONDROPLASTY SYNOVECTOMY SUPRAPATELLER CUFF;  Surgeon: Tobi Bastos, MD;  Location: [facility];  Service: Orthopedics;  Laterality: Left;    Social History   Socioeconomic History  . Marital status: Widowed    Spouse name: Laverna Peace  . Number of children: 2  . Years of education: Not on file  . Highest education level: 12th grade  Occupational History  . Occupation: CNA    Comment: [employer]  . Financial resource strain: Very hard  . Food insecurity:    Worry: Sometimes true    Inability: Sometimes true  . Transportation needs:    Medical: No  Non-medical: No  Tobacco Use  . Smoking status: Never Smoker  . Smokeless tobacco: Never Used  Substance and Sexual Activity  . Alcohol use: No  . Drug use: No  . Sexual activity: Not Currently  Lifestyle  . Physical activity:    Days per week: 0 days    Minutes per session: 0 min  . Stress: Not at all  Relationships  . Social connections:    Talks on phone: More than three times a week    Gets together: More than three times a week    Attends religious service: Never    Active member of club or organization: No    Attends meetings of clubs or organizations: Never    Relationship status: Widowed  . Intimate partner violence:    Fear of current or ex partner: No    Emotionally abused: No    Physically abused: No    Forced sexual  activity: No  Other Topics Concern  . Not on file  Social History Narrative  . Not on file    Outpatient Encounter Medications as of 01/30/2019  Medication Sig  . amLODipine (NORVASC) 10 MG tablet Take 1 tablet (10 mg total) by mouth daily.  Marland Kitchen aspirin 81 MG chewable tablet Chew 4 tablets by mouth daily.   . Biotin 10 MG CAPS Take 1 capsule by mouth daily.   . calcium-vitamin D (OSCAL WITH D) 500-200 MG-UNIT tablet Take 1 tablet by mouth daily with breakfast.  . Cholecalciferol (VITAMIN D-3 PO) Take 1 capsule by mouth daily.   . diclofenac sodium (VOLTAREN) 1 % GEL Apply 2 g topically 4 (four) times daily.  . diphenhydrAMINE (BENADRYL) 25 MG tablet Take 50 mg by mouth at bedtime as needed for allergies.   . Ferrous Sulfate 90 (18 FE) MG TABS Take 1 tablet by mouth every morning.  . gabapentin (NEURONTIN) 100 MG capsule Take 1 capsule (100 mg total) by mouth 4 (four) times daily as needed.  Marland Kitchen ketoconazole (NIZORAL) 2 % cream Apply 1 application topically daily.  Marland Kitchen levothyroxine (SYNTHROID, LEVOTHROID) 100 MCG tablet Take 1 Tablet by mouth once daily BEFORE BREAKFAST  . lovastatin (MEVACOR) 40 MG tablet Take 1 tablet (40 mg total) by mouth at bedtime.  . Multiple Vitamins-Minerals (MULTIVITAMIN ADULT) CHEW Chew 2 tablets by mouth daily.  . mupirocin ointment (BACTROBAN) 2 % Apply 1 application topically 2 (two) times daily.  Marland Kitchen nystatin (MYCOSTATIN/NYSTOP) powder Apply topically 4 (four) times daily.  Marland Kitchen nystatin cream (MYCOSTATIN) Apply 1 application topically 2 (two) times daily.  Marland Kitchen pyridOXINE (VITAMIN B-6) 100 MG tablet Take 100 mg by mouth daily.  . ranitidine (ZANTAC) 150 MG tablet Take 1 tablet (150 mg total) by mouth 2 (two) times daily.  . sodium chloride (OCEAN) 0.65 % SOLN nasal spray Place 1 spray into both nostrils as needed for congestion.  . [DISCONTINUED] HYDROcodone-acetaminophen (NORCO/VICODIN) 5-325 MG tablet Take 1-2 tablets by mouth every 4 (four) hours as needed for severe  pain.  . [DISCONTINUED] sertraline (ZOLOFT) 100 MG tablet Take 1.5 tablets (150 mg total) by mouth every morning.  Marland Kitchen HYDROcodone-acetaminophen (LORTAB) 5-325 MG tablet Take 1 tablet by mouth every 6 (six) hours as needed for up to 7 days for moderate pain.  Marland Kitchen sertraline (ZOLOFT) 100 MG tablet Take 2 tablets (200 mg total) by mouth daily for 30 days.   No facility-administered encounter medications on file as of 01/30/2019.     Allergies  Allergen Reactions  . Crestor [Rosuvastatin] Other (  See Comments)    Body aches.   . Nsaids     Burns stomach.   . Penicillins Hives    Has patient had a PCN reaction causing immediate rash, facial/tongue/throat swelling, SOB or lightheadedness with hypotension: Yes Has patient had a PCN reaction causing severe rash involving mucus membranes or skin necrosis: unknown Has patient had a PCN reaction that required hospitalization No Has patient had a PCN reaction occurring within the last 10 years: No If all of the above answers are "NO", then may proceed with Cephalosporin use.   . Vytorin [Ezetimibe-Simvastatin]     Body aches.   . Tolmetin Nausea Only    Burns stomach.     Review of Systems  Constitutional: Positive for activity change and appetite change. Negative for chills, diaphoresis, fatigue, fever and unexpected weight change.  Eyes: Negative for photophobia and visual disturbance.  Respiratory: Negative for cough, choking, chest tightness and shortness of breath.   Cardiovascular: Negative for chest pain, palpitations and leg swelling.  Gastrointestinal: Negative for abdominal distention, abdominal pain, anal bleeding, blood in stool, constipation, diarrhea, nausea, rectal pain and vomiting.  Genitourinary: Negative for decreased urine volume and difficulty urinating.  Musculoskeletal: Positive for back pain (lower, chronic). Negative for gait problem.  Neurological: Negative for dizziness, tremors, facial asymmetry, weakness,  light-headedness, numbness and headaches.  Psychiatric/Behavioral: Positive for decreased concentration, dysphoric mood and sleep disturbance. Negative for agitation, behavioral problems, confusion, hallucinations, self-injury and suicidal ideas. The patient is not nervous/anxious and is not hyperactive.   All other systems reviewed and are negative.       Objective:  BP (!) 168/70   Pulse (!) 56   Temp (!) 97.3 F (36.3 C) (Oral)   Ht 5\' 3"  (1.6 m)   Wt 179 lb (81.2 kg)   BMI 31.71 kg/m    Wt Readings from Last 3 Encounters:  01/30/19 179 lb (81.2 kg)  01/01/19 176 lb (79.8 kg)  11/19/18 172 lb (78 kg)    Physical Exam Vitals signs and nursing note reviewed.  Constitutional:      General: She is not in acute distress.    Appearance: Normal appearance. She is not ill-appearing or toxic-appearing.  HENT:     Head: Normocephalic and atraumatic.     Right Ear: Tympanic membrane, ear canal and external ear normal.     Left Ear: Tympanic membrane, ear canal and external ear normal.  Neck:     Musculoskeletal: Normal range of motion and neck supple. No neck rigidity.  Cardiovascular:     Rate and Rhythm: Normal rate and regular rhythm.     Pulses: Normal pulses.     Heart sounds: Murmur present. Systolic murmur present with a grade of 2/6.  Abdominal:     General: Bowel sounds are normal.     Palpations: Abdomen is soft.     Tenderness: There is no abdominal tenderness. There is no right CVA tenderness or left CVA tenderness.  Musculoskeletal:     Thoracic back: Normal.     Lumbar back: She exhibits decreased range of motion, tenderness and pain. She exhibits no swelling, no edema, no deformity, no laceration, no spasm and normal pulse.  Skin:    General: Skin is warm and dry.     Capillary Refill: Capillary refill takes less than 2 seconds.  Neurological:     General: No focal deficit present.     Mental Status: She is alert and oriented to person, place, and time.  Sensory: Sensation is intact.     Motor: Motor function is intact.     Coordination: Coordination is intact.     Deep Tendon Reflexes: Reflexes are normal and symmetric.  Psychiatric:        Attention and Perception: Attention and perception normal.        Mood and Affect: Mood is depressed.        Speech: Speech normal.        Behavior: Behavior normal. Behavior is cooperative.        Thought Content: Thought content normal. Thought content does not include homicidal or suicidal ideation. Thought content does not include homicidal or suicidal plan.        Cognition and Memory: Cognition and memory normal.        Judgment: Judgment normal.     Results for orders placed or performed in visit on 12/02/18  Urine Culture  Result Value Ref Range   Urine Culture, Routine Final report    Organism ID, Bacteria Comment   Microscopic Examination  Result Value Ref Range   WBC, UA 11-30 (A) 0 - 5 /hpf   RBC, UA 0-2 0 - 2 /hpf   Epithelial Cells (non renal) 0-10 0 - 10 /hpf   Renal Epithel, UA None seen None seen /hpf   Bacteria, UA Few (A) None seen/Few  Urinalysis, Complete  Result Value Ref Range   Specific Gravity, UA 1.015 1.005 - 1.030   pH, UA 7.0 5.0 - 7.5   Color, UA Yellow Yellow   Appearance Ur Clear Clear   Leukocytes, UA 2+ (A) Negative   Protein, UA Negative Negative/Trace   Glucose, UA Negative Negative   Ketones, UA Negative Negative   RBC, UA Negative Negative   Bilirubin, UA Negative Negative   Urobilinogen, Ur 0.2 0.2 - 1.0 mg/dL   Nitrite, UA Negative Negative   Microscopic Examination See below:        Pertinent labs & imaging results that were available during my care of the patient were reviewed by me and considered in my medical decision making.  Assessment & Plan:  Jazae was seen today for followup low back pain.  Diagnoses and all orders for this visit:  Chronic midline low back pain without sciatica Will give one week of hydrocodone. If pain  continues, will refer to pain management. Pt aware she needs to make appointment with physical therapy as discussed. Report any new or worsening symptoms.  -     HYDROcodone-acetaminophen (LORTAB) 5-325 MG tablet; Take 1 tablet by mouth every 6 (six) hours as needed for up to 7 days for moderate pain. The Narcotic Database has been reviewed.  There were no red flags.    Current mild episode of major depressive disorder, unspecified whether recurrent (HCC) Will increase Zoloft to 200 mg daily. Report any new or worsening symptoms. Follow up in 6 weeks for reevaluation.  -     sertraline (ZOLOFT) 100 MG tablet; Take 2 tablets (200 mg total) by mouth daily for 30 days.     Continue all other maintenance medications.  Follow up plan: Return in about 6 weeks (around 03/13/2019), or if symptoms worsen or fail to improve.  Educational handout given for back exercises   The above assessment and management plan was discussed with the patient. The patient verbalized understanding of and has agreed to the management plan. Patient is aware to call the clinic if symptoms persist or worsen. Patient is aware when to return to  the clinic for a follow-up visit. Patient educated on when it is appropriate to go to the emergency department.   Monia Pouch, FNP-C [facility] [phone]

## 2019-01-30 NOTE — Patient Instructions (Signed)

## 2019-02-03 ENCOUNTER — Telehealth: Payer: Medicare Other | Admitting: *Deleted

## 2019-02-13 ENCOUNTER — Telehealth: Payer: Medicare Other

## 2019-02-13 ENCOUNTER — Ambulatory Visit: Payer: Medicare Other | Admitting: Family Medicine

## 2019-02-27 ENCOUNTER — Other Ambulatory Visit: Payer: Self-pay | Admitting: Family Medicine

## 2019-02-27 DIAGNOSIS — F320 Major depressive disorder, single episode, mild: Secondary | ICD-10-CM

## 2019-03-03 ENCOUNTER — Ambulatory Visit: Payer: Medicare Other | Admitting: Physical Therapy

## 2019-03-03 IMAGING — DX DG SHOULDER 2+V*R*
3 series · 3 of 3 positions shown · non-contrast
Comparison: Chest x-ray 09/11/2018

CLINICAL DATA: Right shoulder pain. Previous right humeral fracture
8500.

EXAM:
RIGHT SHOULDER - 2+ VIEW

[shoulder ap]
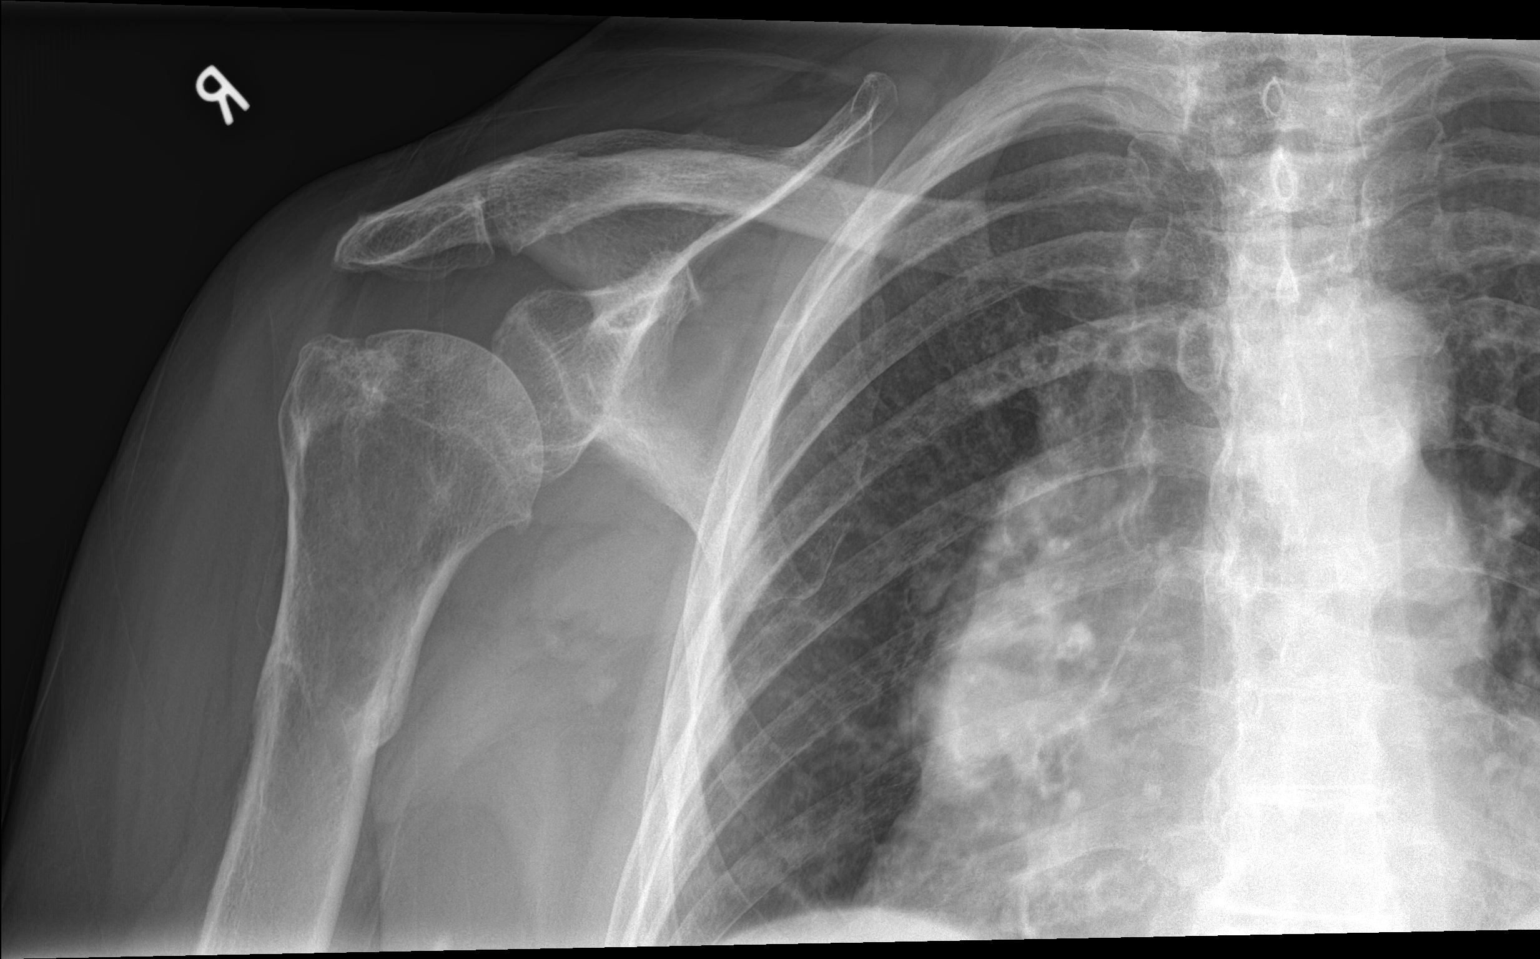

[shoulder obl]
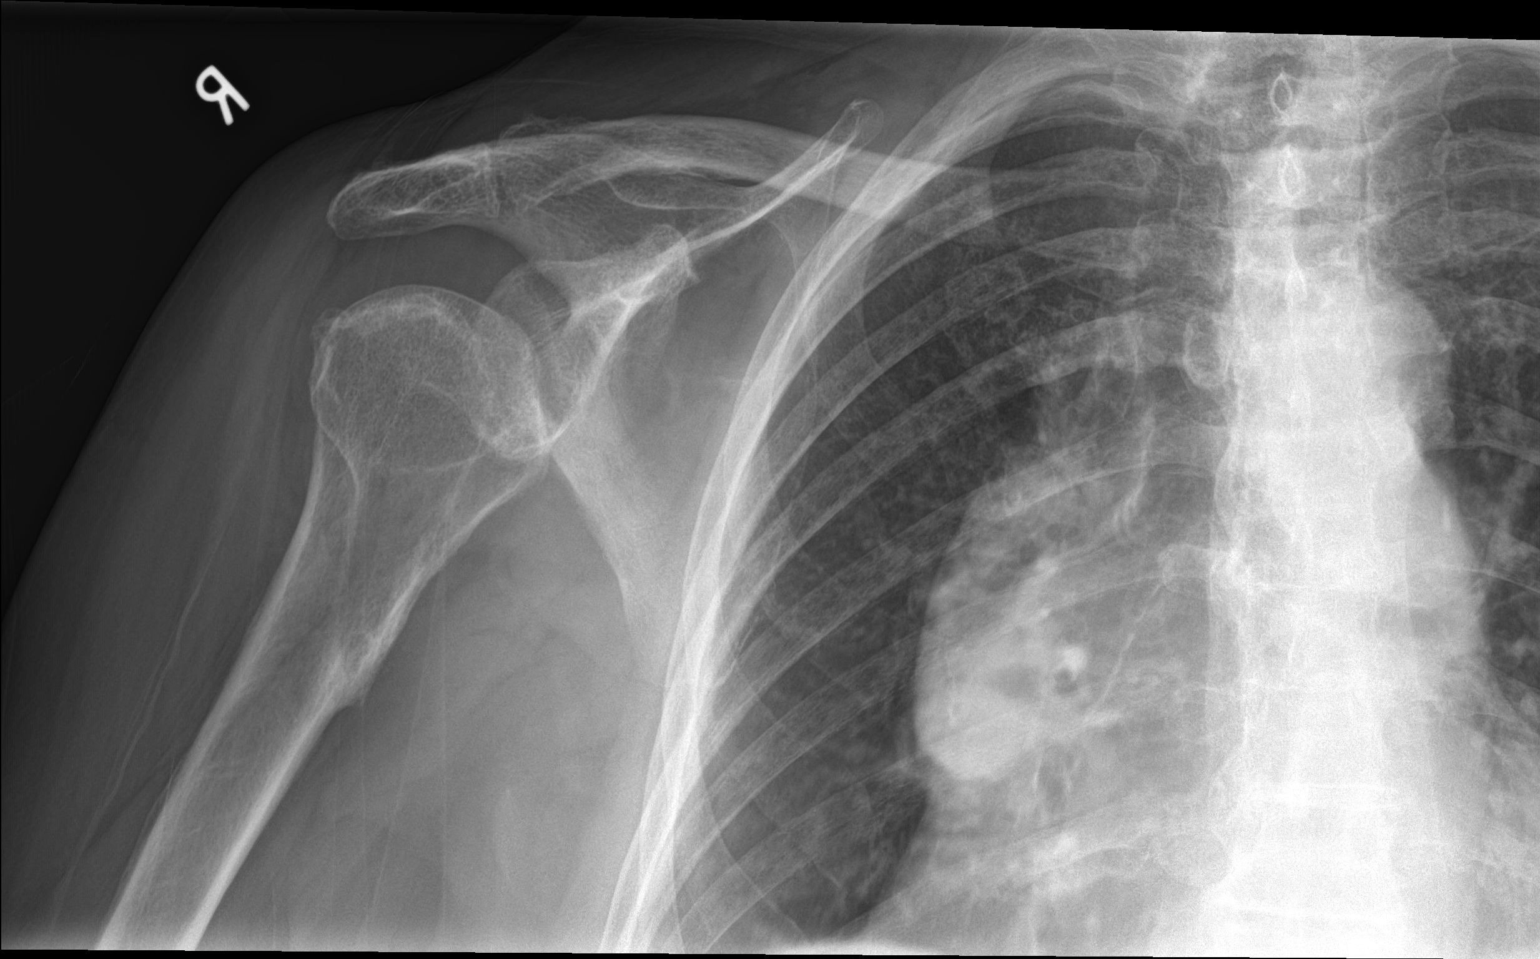

[shoulder axial]
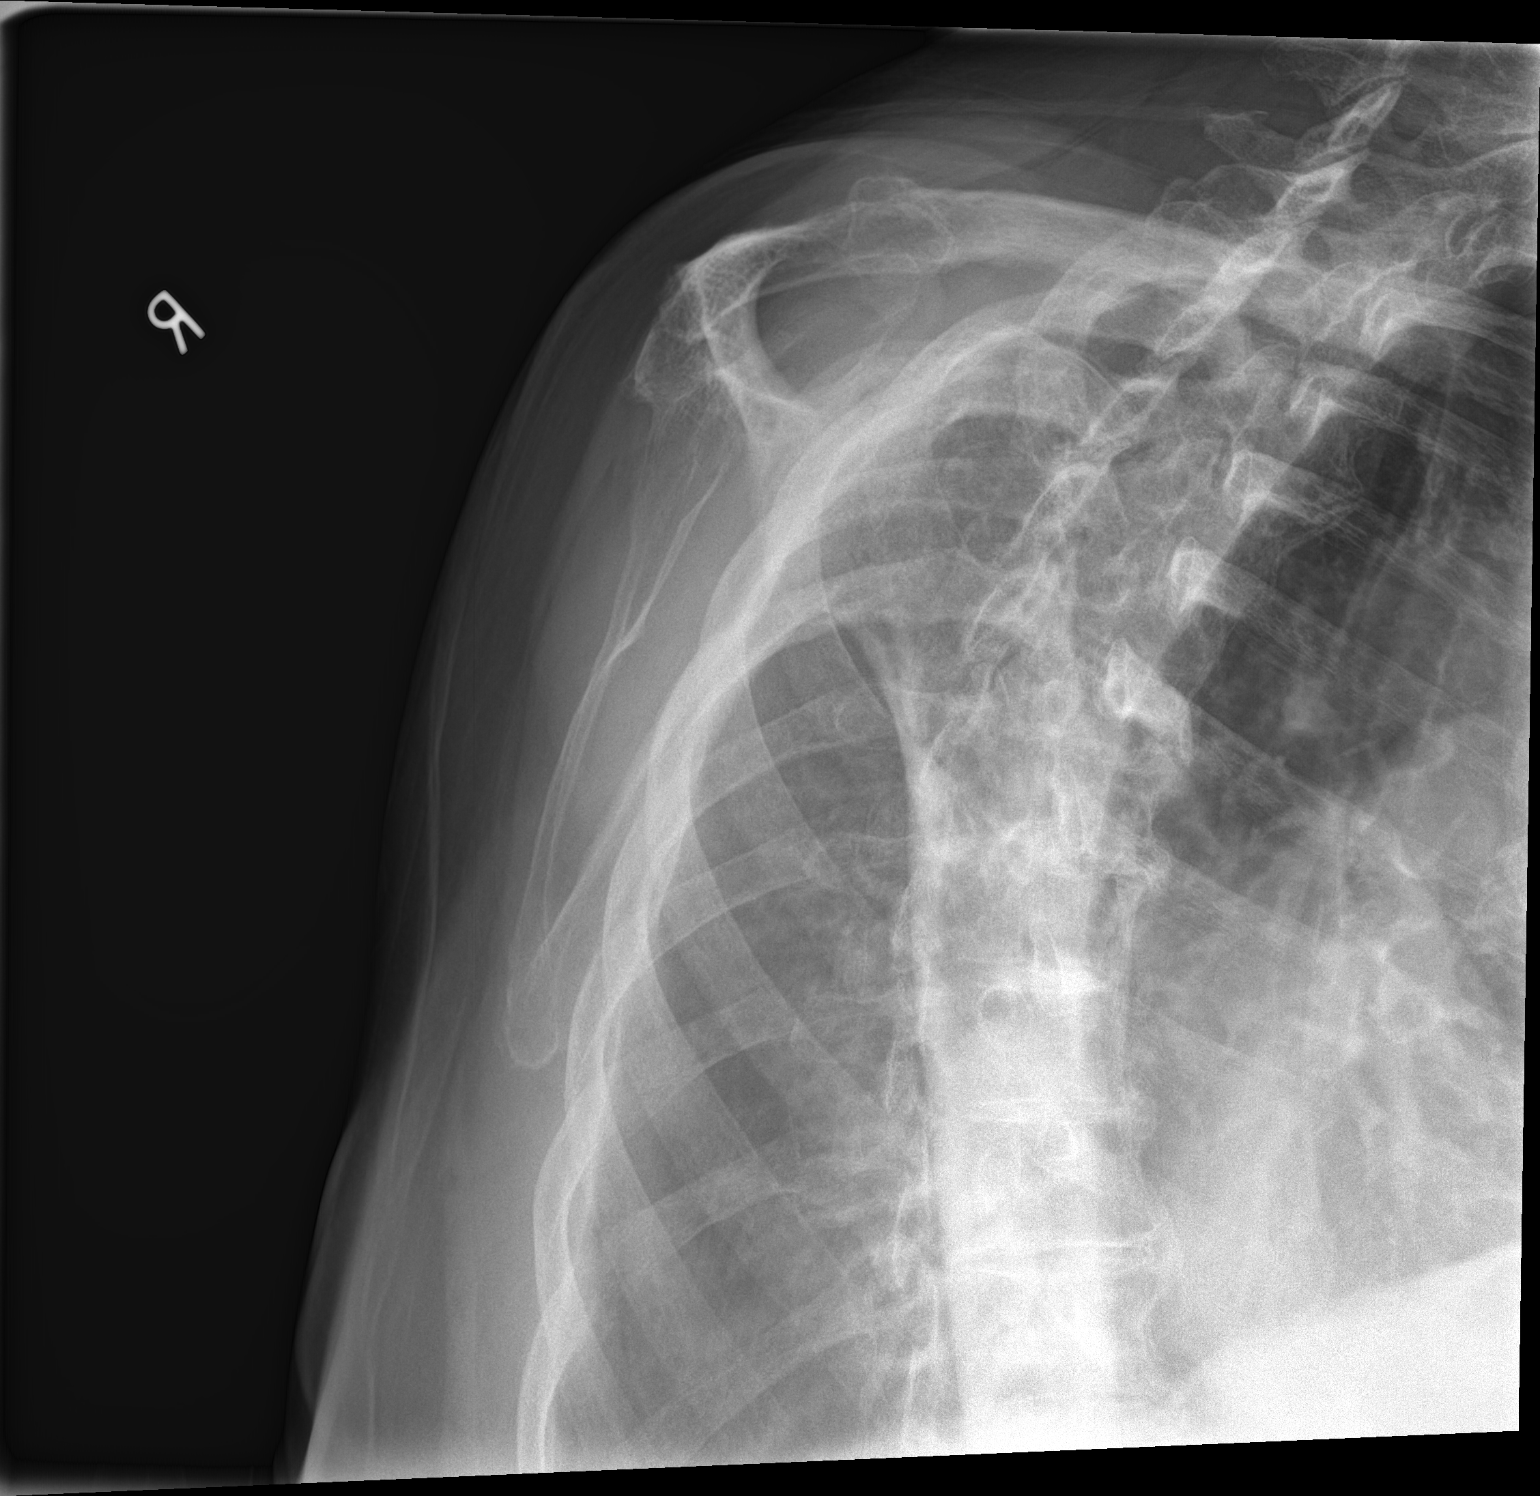

[3 of 3 positions shown; findings below may reference images not displayed]

FINDINGS: Mild degenerate changes of the AC joint and glenohumeral joints.
Evidence patient's old proximal right humeral diaphyseal fracture.
No evidence of acute fracture or dislocation.
IMPRESSION: No acute findings.

Degenerative change of the AC joint and glenohumeral joints.

## 2019-03-07 ENCOUNTER — Other Ambulatory Visit: Payer: Self-pay

## 2019-03-07 DIAGNOSIS — M549 Dorsalgia, unspecified: Secondary | ICD-10-CM

## 2019-03-13 ENCOUNTER — Ambulatory Visit (INDEPENDENT_AMBULATORY_CARE_PROVIDER_SITE_OTHER): Payer: Medicare Other | Admitting: Family Medicine

## 2019-03-13 ENCOUNTER — Other Ambulatory Visit: Payer: Self-pay

## 2019-03-13 ENCOUNTER — Encounter: Payer: Self-pay | Admitting: Family Medicine

## 2019-03-13 VITALS — BP 139/70 | HR 70 | Temp 98.6°F | Ht 63.0 in | Wt 184.0 lb

## 2019-03-13 DIAGNOSIS — E1169 Type 2 diabetes mellitus with other specified complication: Secondary | ICD-10-CM | POA: Diagnosis not present

## 2019-03-13 DIAGNOSIS — G8929 Other chronic pain: Secondary | ICD-10-CM

## 2019-03-13 DIAGNOSIS — E039 Hypothyroidism, unspecified: Secondary | ICD-10-CM

## 2019-03-13 DIAGNOSIS — I10 Essential (primary) hypertension: Secondary | ICD-10-CM

## 2019-03-13 DIAGNOSIS — M8000XS Age-related osteoporosis with current pathological fracture, unspecified site, sequela: Secondary | ICD-10-CM

## 2019-03-13 DIAGNOSIS — F339 Major depressive disorder, recurrent, unspecified: Secondary | ICD-10-CM

## 2019-03-13 DIAGNOSIS — M2550 Pain in unspecified joint: Secondary | ICD-10-CM

## 2019-03-13 DIAGNOSIS — I152 Hypertension secondary to endocrine disorders: Secondary | ICD-10-CM

## 2019-03-13 DIAGNOSIS — E785 Hyperlipidemia, unspecified: Secondary | ICD-10-CM

## 2019-03-13 DIAGNOSIS — E1159 Type 2 diabetes mellitus with other circulatory complications: Secondary | ICD-10-CM

## 2019-03-13 DIAGNOSIS — E7800 Pure hypercholesterolemia, unspecified: Secondary | ICD-10-CM

## 2019-03-13 DIAGNOSIS — M8000XA Age-related osteoporosis with current pathological fracture, unspecified site, initial encounter for fracture: Secondary | ICD-10-CM | POA: Insufficient documentation

## 2019-03-13 DIAGNOSIS — K219 Gastro-esophageal reflux disease without esophagitis: Secondary | ICD-10-CM

## 2019-03-13 LAB — BAYER DCA HB A1C WAIVED: HB A1C (BAYER DCA - WAIVED): 5.8 % (ref <7.0)

## 2019-03-13 MED ORDER — CALCIUM CARBONATE-VITAMIN D 500-200 MG-UNIT PO TABS: 1.0000 | ORAL_TABLET | Freq: Every day | ORAL | 3 refills | Status: DC

## 2019-03-13 MED ORDER — LEVOTHYROXINE SODIUM 100 MCG PO TABS: ORAL_TABLET | ORAL | 1 refills | Status: DC

## 2019-03-13 MED ORDER — LOVASTATIN 40 MG PO TABS: 40.0000 mg | ORAL_TABLET | Freq: Every day | ORAL | 3 refills | Status: DC

## 2019-03-13 MED ORDER — AMLODIPINE BESYLATE 10 MG PO TABS: 10.0000 mg | ORAL_TABLET | Freq: Every day | ORAL | 1 refills | Status: DC

## 2019-03-13 MED ORDER — ALENDRONATE SODIUM 70 MG PO TABS: 70.0000 mg | ORAL_TABLET | ORAL | 11 refills | Status: DC

## 2019-03-13 MED ORDER — SERTRALINE HCL 100 MG PO TABS: 200.0000 mg | ORAL_TABLET | Freq: Every day | ORAL | 3 refills | Status: DC

## 2019-03-13 MED ORDER — RANITIDINE HCL 150 MG PO TABS: 150.0000 mg | ORAL_TABLET | Freq: Two times a day (BID) | ORAL | 3 refills | Status: DC

## 2019-03-13 NOTE — Patient Instructions (Signed)
If your symptoms worsen or you have thoughts of suicide/homicide, PLEASE SEEK IMMEDIATE MEDICAL ATTENTION.  You may always call the National Suicide Hotline.  This is available 24 hours a day, 7 days a week.  Their number is: 1-800-273-8255  Taking the medicine as directed and not missing any doses is one of the best things you can do to treat your depression.  Here are some things to keep in mind:  1) Side effects (stomach upset, some increased anxiety) may happen before you notice a benefit.  These side effects typically go away over time. 2) Changes to your dose of medicine or a change in medication all together is sometimes necessary 3) Most people need to be on medication at least 12 months 4) Many people will notice an improvement within two weeks but the full effect of the medication can take up to 4-6 weeks 5) Stopping the medication when you start feeling better often results in a return of symptoms 6) Never discontinue your medication without contacting a health care professional first.  Some medications require gradual discontinuation/ taper and can make you sick if you stop them abruptly.  If your symptoms worsen or you have thoughts of suicide/homicide, PLEASE SEEK IMMEDIATE MEDICAL ATTENTION.  You may always call:  National Suicide Hotline: 800-273-8255 [place] Crisis Line: 336-832-9700 Crisis Recovery in [place]: 800-939-5911   These are available 24 hours a day, 7 days a week.   

## 2019-03-13 NOTE — Progress Notes (Signed)
Subjective:  Patient ID: Danielle Parrish, female    DOB: Jun 24, 1941, 78 y.o.   MRN: [id number]  Chief Complaint:  Depression (6 week follow up)   HPI: Danielle Parrish is a 78 y.o. female presenting on 03/13/2019 for Depression (6 week follow up)   1. Depression, recurrent ([place])  Doing fair. States she is taking her Zoloft twice daily instead of once daily. Proper instructions on dosing discussed. No side effects from medications. No SI / HI. Depression screen [facility] 2/9 03/13/2019 01/30/2019 01/01/2019 11/19/2018 11/08/2018  Decreased Interest _0 0 1  Down, Depressed, Hopeless _1 PHQ - 2 Score _2 Altered sleeping 0 1 0 - 0  Tired, decreased energy _3 - 0  Change in appetite _4 - 0  Feeling bad or failure about yourself  0 1 1 - 0  Trouble concentrating _5 - 1  Moving slowly or fidgety/restless 1 1 0 - 0  Suicidal thoughts 0 1 1 - 0  PHQ-9 Score _6 - 3  Difficult doing work/chores - Not difficult at all Somewhat difficult - -  Some recent data might be hidden     2. Age-related osteoporosis with current pathological fracture, sequela  Has not started Prolia due to insurance coverage.has been taking Oscal as prescribed. Does not partake in weight bearing exercise. Continues to have lower back and shoulder discomfort intermittently.    3. Hypertension associated with type 2 diabetes mellitus ([place])  Complaint with meds - Yes Checking BP at home ranging 130/70 Exercising Regularly - No Watching Salt intake - No Pertinent ROS:  Headache - No Chest pain - No Dyspnea - No Palpitations - No LE edema - No They report good compliance with medications and can restate their regimen by memory. No medication side effects.  BP Readings from Last 3 Encounters:  03/13/19 139/70  01/30/19 (!) 168/70  01/01/19 (!) 156/84     4. Hyperlipidemia associated with type 2 diabetes mellitus ([place])  Compliant with medications. Does not diet or exercise.    5.  Type 2 diabetes mellitus with hypercholesterolemia ([place])  Diabetes mellitus 2 diet controlled Compliant with meds - Diet controlled Checking CBGs? No Exercising regularly? - No Watching carbohydrate intake? - No Neuropathy ? - No Pertinent ROS:  Polyuria - No Polydipsia - No Vision problems - No   6. Chronic pain of multiple joints  Ongoing pain of lower back, shoulders, and knees. Pt has not followed up with PT as discussed. Pt states she has had issues with getting to her appointments but is working on that. She states the pain is daily, aching to sharp, 8/10 at worst. No loss of function or weakness.    7. Hypothyroidism, unspecified type  Compliant with medications. Denies changes in skin, mood, bowel habits, weight, or hair.    8. Gastroesophageal reflux disease without esophagitis  Not compliant with her Zantac, only takes on an as needed basis. Has noticed an increase in GERD symptoms. Importance of daily dosing of Zantac. No hemoptysis, sore throat, changes in voice, or cough.      Relevant past medical, surgical, family, and social history reviewed and updated as indicated.  Allergies and medications reviewed and updated.   Past Medical History:  Diagnosis Date  . Anxiety   . Arthritis   . CHF (congestive heart failure) ([place])   . Chronic pain of multiple  joints   . Depression   . Essential hypertension   . GERD (gastroesophageal reflux disease)   . Hyperlipidemia   . Hyperlipidemia associated with type 2 diabetes mellitus ([place])   . Hypertension associated with type 2 diabetes mellitus ([place])   . Hypothyroidism   . Osteoporosis   . Sleep apnea    CPAP  . Type 2 diabetes mellitus with hypercholesterolemia ([place])   . Vitamin D deficiency     Past Surgical History:  Procedure Laterality Date  . ABDOMINAL HERNIA REPAIR    . ABDOMINAL HYSTERECTOMY  1977  . BREAST EXCISIONAL BIOPSY Right   . KNEE ARTHROSCOPY Left 09/30/2014   Procedure: LEFT KNEE ARTHROSCOPY MEDIAL  MENISECTOMY ABRASION CHONDROPLASTY SYNOVECTOMY SUPRAPATELLER CUFF;  Surgeon: Tobi Bastos, MD;  Location: [facility];  Service: Orthopedics;  Laterality: Left;    Social History   Socioeconomic History  . Marital status: Widowed    Spouse name: Laverna Peace  . Number of children: 2  . Years of education: Not on file  . Highest education level: 12th grade  Occupational History  . Occupation: CNA    Comment: [employer]  . Financial resource strain: Very hard  . Food insecurity:    Worry: Sometimes true    Inability: Sometimes true  . Transportation needs:    Medical: No    Non-medical: No  Tobacco Use  . Smoking status: Never Smoker  . Smokeless tobacco: Never Used  Substance and Sexual Activity  . Alcohol use: No  . Drug use: No  . Sexual activity: Not Currently  Lifestyle  . Physical activity:    Days per week: 0 days    Minutes per session: 0 min  . Stress: Not at all  Relationships  . Social connections:    Talks on phone: More than three times a week    Gets together: More than three times a week    Attends religious service: Never    Active member of club or organization: No    Attends meetings of clubs or organizations: Never    Relationship status: Widowed  . Intimate partner violence:    Fear of current or ex partner: No    Emotionally abused: No    Physically abused: No    Forced sexual activity: No  Other Topics Concern  . Not on file  Social History Narrative  . Not on file    Outpatient Encounter Medications as of 03/13/2019  Medication Sig  . amLODipine (NORVASC) 10 MG tablet Take 1 tablet (10 mg total) by mouth daily.  Marland Kitchen aspirin 81 MG chewable tablet Chew 4 tablets by mouth daily.   . Biotin 10 MG CAPS Take 1 capsule by mouth daily.   . Cholecalciferol (VITAMIN D-3 PO) Take 1 capsule by mouth daily.   . diclofenac sodium (VOLTAREN) 1 % GEL Apply 2 g topically 4 (four) times daily.  . diphenhydrAMINE (BENADRYL) 25 MG tablet Take 50 mg by  mouth at bedtime as needed for allergies.   Marland Kitchen ketoconazole (NIZORAL) 2 % cream Apply 1 application topically daily.  Marland Kitchen levothyroxine (SYNTHROID, LEVOTHROID) 100 MCG tablet Take 1 Tablet by mouth once daily BEFORE BREAKFAST  . lovastatin (MEVACOR) 40 MG tablet Take 1 tablet (40 mg total) by mouth at bedtime.  . Multiple Vitamins-Minerals (MULTIVITAMIN ADULT) CHEW Chew 2 tablets by mouth daily.  . mupirocin ointment (BACTROBAN) 2 % Apply 1 application topically 2 (two) times daily.  Marland Kitchen nystatin (MYCOSTATIN/NYSTOP) powder Apply topically 4 (  four) times daily.  Marland Kitchen nystatin cream (MYCOSTATIN) Apply 1 application topically 2 (two) times daily.  Marland Kitchen pyridOXINE (VITAMIN B-6) 100 MG tablet Take 100 mg by mouth daily.  . sertraline (ZOLOFT) 100 MG tablet Take 2 tablets (200 mg total) by mouth daily for 30 days.  . sodium chloride (OCEAN) 0.65 % SOLN nasal spray Place 1 spray into both nostrils as needed for congestion.  . [DISCONTINUED] amLODipine (NORVASC) 10 MG tablet Take 1 tablet (10 mg total) by mouth daily.  . [DISCONTINUED] Ferrous Sulfate 90 (18 FE) MG TABS Take 1 tablet by mouth every morning.  . [DISCONTINUED] levothyroxine (SYNTHROID, LEVOTHROID) 100 MCG tablet Take 1 Tablet by mouth once daily BEFORE BREAKFAST  . [DISCONTINUED] lovastatin (MEVACOR) 40 MG tablet Take 1 tablet (40 mg total) by mouth at bedtime.  . [DISCONTINUED] sertraline (ZOLOFT) 100 MG tablet Take 2 tablets (200 mg total) by mouth daily for 30 days.  Marland Kitchen alendronate (FOSAMAX) 70 MG tablet Take 1 tablet (70 mg total) by mouth every 7 (seven) days. Take with a full glass of water on an empty stomach.  . calcium-vitamin D (OSCAL WITH D) 500-200 MG-UNIT tablet Take 1 tablet by mouth daily with breakfast.  . ranitidine (ZANTAC) 150 MG tablet Take 1 tablet (150 mg total) by mouth 2 (two) times daily.  . [DISCONTINUED] calcium-vitamin D (OSCAL WITH D) 500-200 MG-UNIT tablet Take 1 tablet by mouth daily with breakfast.  . [DISCONTINUED]  gabapentin (NEURONTIN) 100 MG capsule Take 1 capsule (100 mg total) by mouth 4 (four) times daily as needed.  . [DISCONTINUED] ranitidine (ZANTAC) 150 MG tablet Take 1 tablet (150 mg total) by mouth 2 (two) times daily.   No facility-administered encounter medications on file as of 03/13/2019.     Allergies  Allergen Reactions  . Crestor [Rosuvastatin] Other (See Comments)    Body aches.   . Nsaids     Burns stomach.   . Penicillins Hives    Has patient had a PCN reaction causing immediate rash, facial/tongue/throat swelling, SOB or lightheadedness with hypotension: Yes Has patient had a PCN reaction causing severe rash involving mucus membranes or skin necrosis: unknown Has patient had a PCN reaction that required hospitalization No Has patient had a PCN reaction occurring within the last 10 years: No If all of the above answers are "NO", then may proceed with Cephalosporin use.   . Vytorin [Ezetimibe-Simvastatin]     Body aches.   . Tolmetin Nausea Only    Burns stomach.     Review of Systems  Constitutional: Negative for activity change, appetite change, chills, fatigue, fever and unexpected weight change.  HENT: Positive for hearing loss (chronic).   Eyes: Negative for photophobia and visual disturbance.  Respiratory: Negative for cough, chest tightness, shortness of breath and wheezing.   Cardiovascular: Negative for chest pain, palpitations and leg swelling.  Gastrointestinal: Positive for abdominal pain (epigastric). Negative for abdominal distention, anal bleeding, blood in stool, constipation, diarrhea, nausea, rectal pain and vomiting.  Endocrine: Negative for cold intolerance, heat intolerance, polydipsia, polyphagia and polyuria.  Genitourinary: Negative for decreased urine volume and difficulty urinating.  Musculoskeletal: Positive for arthralgias and back pain. Negative for gait problem, joint swelling, myalgias, neck pain and neck stiffness.  Skin: Negative for rash  and wound.  Neurological: Negative for dizziness, tremors, seizures, syncope, facial asymmetry, speech difficulty, weakness, light-headedness, numbness and headaches.  Psychiatric/Behavioral: Positive for dysphoric mood. Negative for confusion.  All other systems reviewed and are negative.  Objective:  BP 139/70   Pulse 70   Temp 98.6 F (37 C) (Oral)   Ht 5' 3" (1.6 m)   Wt 184 lb (83.5 kg)   BMI 32.59 kg/m    Wt Readings from Last 3 Encounters:  03/13/19 184 lb (83.5 kg)  01/30/19 179 lb (81.2 kg)  01/01/19 176 lb (79.8 kg)    Physical Exam Vitals signs and nursing note reviewed.  Constitutional:      General: She is not in acute distress.    Appearance: Normal appearance. She is well-developed and well-groomed. She is obese. She is not ill-appearing or toxic-appearing.  HENT:     Head: Normocephalic and atraumatic.     Jaw: There is normal jaw occlusion.     Right Ear: Tympanic membrane, ear canal and external ear normal. Decreased hearing noted.     Left Ear: Tympanic membrane, ear canal and external ear normal. Decreased hearing noted.     Nose: Nose normal.     Mouth/Throat:     Lips: Pink.     Mouth: Mucous membranes are moist.     Pharynx: Oropharynx is clear. Uvula midline.  Eyes:     General: Lids are normal.     Extraocular Movements: Extraocular movements intact.     Conjunctiva/sclera: Conjunctivae normal.     Pupils: Pupils are equal, round, and reactive to light.  Neck:     Musculoskeletal: Neck supple.     Thyroid: No thyroid mass, thyromegaly or thyroid tenderness.     Vascular: No carotid bruit or JVD.     Trachea: Trachea and phonation normal.  Cardiovascular:     Rate and Rhythm: Normal rate and regular rhythm.     Pulses: Normal pulses.          Dorsalis pedis pulses are 2+ on the right side and 2+ on the left side.       Posterior tibial pulses are 2+ on the right side and 2+ on the left side.     Heart sounds: Murmur present. Systolic  murmur present with a grade of 2/6. No friction rub. No gallop.   Pulmonary:     Effort: Pulmonary effort is normal. No respiratory distress.     Breath sounds: Normal breath sounds.  Musculoskeletal:     Right lower leg: No edema.     Left lower leg: No edema.  Feet:     Right foot:     Protective Sensation: 10 sites tested. 10 sites sensed.     Skin integrity: Skin integrity normal.     Left foot:     Protective Sensation: 10 sites tested. 10 sites sensed.     Skin integrity: Skin integrity normal.  Skin:    General: Skin is warm and dry.     Capillary Refill: Capillary refill takes less than 2 seconds.  Neurological:     General: No focal deficit present.     Mental Status: She is alert and oriented to person, place, and time.     Cranial Nerves: Cranial nerves are intact.     Sensory: Sensation is intact.     Motor: Motor function is intact.     Coordination: Coordination is intact.     Gait: Gait is intact.     Deep Tendon Reflexes: Reflexes are normal and symmetric.  Psychiatric:        Attention and Perception: Attention and perception normal.        Mood and Affect: Mood is depressed.  Speech: Speech normal.        Behavior: Behavior normal. Behavior is cooperative.        Thought Content: Thought content normal. Thought content does not include homicidal or suicidal ideation. Thought content does not include homicidal or suicidal plan.        Cognition and Memory: Cognition and memory normal.        Judgment: Judgment normal.     Results for orders placed or performed in visit on 12/02/18  Urine Culture  Result Value Ref Range   Urine Culture, Routine Final report    Organism ID, Bacteria Comment   Microscopic Examination  Result Value Ref Range   WBC, UA 11-30 (A) 0 - 5 /hpf   RBC, UA 0-2 0 - 2 /hpf   Epithelial Cells (non renal) 0-10 0 - 10 /hpf   Renal Epithel, UA None seen None seen /hpf   Bacteria, UA Few (A) None seen/Few  Urinalysis, Complete   Result Value Ref Range   Specific Gravity, UA 1.015 1.005 - 1.030   pH, UA 7.0 5.0 - 7.5   Color, UA Yellow Yellow   Appearance Ur Clear Clear   Leukocytes, UA 2+ (A) Negative   Protein, UA Negative Negative/Trace   Glucose, UA Negative Negative   Ketones, UA Negative Negative   RBC, UA Negative Negative   Bilirubin, UA Negative Negative   Urobilinogen, Ur 0.2 0.2 - 1.0 mg/dL   Nitrite, UA Negative Negative   Microscopic Examination See below:      A1C 5.8 in office today.   Pertinent labs & imaging results that were available during my care of the patient were reviewed by me and considered in my medical decision making.  Assessment & Plan:  Danielle Parrish was seen today for depression.  Diagnoses and all orders for this visit:  Depression, recurrent ([place]) Proper dosing of medications discussed. Report any new or worsening symptoms. Medications as prescribed.  -     TSH -     sertraline (ZOLOFT) 100 MG tablet; Take 2 tablets (200 mg total) by mouth daily for 30 days.  Age-related osteoporosis with current pathological fracture, sequela Insurance will not cover Prolia, will start alendronate today. Labs pending.  -     CMP14+EGFR -     alendronate (FOSAMAX) 70 MG tablet; Take 1 tablet (70 mg total) by mouth every 7 (seven) days. Take with a full glass of water on an empty stomach. -     calcium-vitamin D (OSCAL WITH D) 500-200 MG-UNIT tablet; Take 1 tablet by mouth daily with breakfast.  Hypertension associated with type 2 diabetes mellitus ([place]) Stable, continue below. Labs pending.  -     CMP14+EGFR -     CBC with Differential/Platelet -     TSH -     Microalbumin / creatinine urine ratio -     amLODipine (NORVASC) 10 MG tablet; Take 1 tablet (10 mg total) by mouth daily.  Hyperlipidemia associated with type 2 diabetes mellitus ([place]) Labs pending. Diet and exercise encouraged. Medications as prescribed.  -     Lipid panel -     lovastatin (MEVACOR) 40 MG tablet; Take 1 tablet  (40 mg total) by mouth at bedtime.  Type 2 diabetes mellitus with hypercholesterolemia ([facility]) A1C 5.8 in office today. Continue diet and exercise. Labs pending.  -     CMP14+EGFR -     Microalbumin / creatinine urine ratio -     Bayer DCA Hb A1c Waived  Chronic pain of multiple joints Pt encouraged to attend PT appointment. Referral to pain management placed due to ongoing pain.  -     Ambulatory referral to Pain Clinic  Hypothyroidism, unspecified type TSH pending. Will change therapy if warranted.  -     levothyroxine (SYNTHROID, LEVOTHROID) 100 MCG tablet; Take 1 Tablet by mouth once daily BEFORE BREAKFAST  Gastroesophageal reflux disease without esophagitis Discussed proper daily dosing of medications. Report any new or worsening symptoms.  -     ranitidine (ZANTAC) 150 MG tablet; Take 1 tablet (150 mg total) by mouth 2 (two) times daily.     Continue all other maintenance medications.  Follow up plan: Return in 3 months (on 06/13/2019), or if symptoms worsen or fail to improve.  Educational handout given for depression   The above assessment and management plan was discussed with the patient. The patient verbalized understanding of and has agreed to the management plan. Patient is aware to call the clinic if symptoms persist or worsen. Patient is aware when to return to the clinic for a follow-up visit. Patient educated on when it is appropriate to go to the emergency department.   Monia Pouch, FNP-C [facility] [phone]

## 2019-03-14 ENCOUNTER — Other Ambulatory Visit: Payer: Self-pay | Admitting: Family Medicine

## 2019-03-14 DIAGNOSIS — I10 Essential (primary) hypertension: Principal | ICD-10-CM

## 2019-03-14 DIAGNOSIS — E1159 Type 2 diabetes mellitus with other circulatory complications: Secondary | ICD-10-CM

## 2019-03-14 LAB — CBC WITH DIFFERENTIAL/PLATELET
Basophils Absolute: 0 10*3/uL (ref 0.0–0.2)
Basos: 0 %
EOS (ABSOLUTE): 0.1 10*3/uL (ref 0.0–0.4)
Eos: 1 %
Hematocrit: 39.7 % (ref 34.0–46.6)
Hemoglobin: 12.9 g/dL (ref 11.1–15.9)
Immature Grans (Abs): 0 10*3/uL (ref 0.0–0.1)
Immature Granulocytes: 0 %
Lymphocytes Absolute: 1.8 10*3/uL (ref 0.7–3.1)
Lymphs: 26 %
MCH: 29.4 pg (ref 26.6–33.0)
MCHC: 32.5 g/dL (ref 31.5–35.7)
MCV: 90 fL (ref 79–97)
Monocytes Absolute: 0.8 10*3/uL (ref 0.1–0.9)
Monocytes: 12 %
Neutrophils Absolute: 4.1 10*3/uL (ref 1.4–7.0)
Neutrophils: 61 %
Platelets: 243 10*3/uL (ref 150–450)
RBC: 4.39 x10E6/uL (ref 3.77–5.28)
RDW: 12.6 % (ref 11.7–15.4)
WBC: 6.8 10*3/uL (ref 3.4–10.8)

## 2019-03-14 LAB — LIPID PANEL
Chol/HDL Ratio: 3.4 ratio (ref 0.0–4.4)
Cholesterol, Total: 164 mg/dL (ref 100–199)
HDL: 48 mg/dL (ref >39)
LDL Calculated: 94 mg/dL (ref 0–99)
Triglycerides: 109 mg/dL (ref 0–149)
VLDL Cholesterol Cal: 22 mg/dL (ref 5–40)

## 2019-03-14 LAB — CMP14+EGFR
ALT: 12 IU/L (ref 0–32)
AST: 12 IU/L (ref 0–40)
Albumin/Globulin Ratio: 1.6 (ref 1.2–2.2)
Albumin: 4.2 g/dL (ref 3.7–4.7)
Alkaline Phosphatase: 90 IU/L (ref 39–117)
BUN/Creatinine Ratio: 19 (ref 12–28)
BUN: 12 mg/dL (ref 8–27)
Bilirubin Total: 0.3 mg/dL (ref 0.0–1.2)
CO2: 26 mmol/L (ref 20–29)
Calcium: 9.7 mg/dL (ref 8.7–10.3)
Chloride: 97 mmol/L (ref 96–106)
Creatinine, Ser: 0.63 mg/dL (ref 0.57–1.00)
GFR calc Af Amer: 99 mL/min/{1.73_m2} (ref >59)
GFR calc non Af Amer: 86 mL/min/{1.73_m2} (ref >59)
Globulin, Total: 2.6 g/dL (ref 1.5–4.5)
Glucose: 115 mg/dL — ABNORMAL HIGH (ref 65–99)
Potassium: 4.1 mmol/L (ref 3.5–5.2)
Sodium: 141 mmol/L (ref 134–144)
Total Protein: 6.8 g/dL (ref 6.0–8.5)

## 2019-03-14 LAB — MICROALBUMIN / CREATININE URINE RATIO
Creatinine, Urine: 135.4 mg/dL
Microalb/Creat Ratio: 40 mg/g creat — ABNORMAL HIGH (ref 0–29)
Microalbumin, Urine: 54.5 ug/mL

## 2019-03-14 LAB — TSH: TSH: 1.45 u[IU]/mL (ref 0.450–4.500)

## 2019-03-14 MED ORDER — LISINOPRIL 10 MG PO TABS: 10.0000 mg | ORAL_TABLET | Freq: Every day | ORAL | 3 refills | Status: DC

## 2019-03-18 ENCOUNTER — Telehealth: Payer: Self-pay | Admitting: Family Medicine

## 2019-03-18 NOTE — Telephone Encounter (Signed)
lmtcb

## 2019-03-20 ENCOUNTER — Encounter: Payer: Self-pay | Admitting: *Deleted

## 2019-03-20 MED ORDER — DENOSUMAB 60 MG/ML ~~LOC~~ SOSY: PREFILLED_SYRINGE | SUBCUTANEOUS | 0 refills | Status: DC

## 2019-03-20 NOTE — Progress Notes (Signed)
PROLIA: Summary of Benefits Interpretation  March 20, 2019   Purchase Information  Last purchase location: [place] . Primary: UHC Medicare . Secondary: Medicaid  (If Medicaid, the patient's cost will be $0 and can be purchased at their local pharmacy through their Rx Benefits)  Summary of Benefits  . Received on: 03/20/2019 . Estimated Cost to Patient: $0 . Prior authorization required: No (If Yes, submit on Cover My Meds or refer to the SOB for online form location) o Auth or Reference #: n/a o Approval valid from  n/a  . Physician Purchase Covered: Yes  , with Medicaid as secondary insurance it is less expensive for patient to get medication at her pharmacy.  [facility] Covered:No  o Pharmacy Name:   Patient scheduled to come in for Prolia Injection - she will bring medication with her on 03/25/2019.   Hulen Skains, Deionna Marcantonio M   03/20/2019 [place] [phone]

## 2019-03-24 ENCOUNTER — Other Ambulatory Visit: Payer: Self-pay

## 2019-03-24 ENCOUNTER — Telehealth: Payer: Self-pay | Admitting: Family Medicine

## 2019-03-25 ENCOUNTER — Ambulatory Visit: Payer: Self-pay

## 2019-03-25 ENCOUNTER — Other Ambulatory Visit: Payer: Self-pay | Admitting: *Deleted

## 2019-03-25 MED ORDER — DENOSUMAB 60 MG/ML ~~LOC~~ SOSY: PREFILLED_SYRINGE | SUBCUTANEOUS | 0 refills | Status: DC

## 2019-03-31 ENCOUNTER — Other Ambulatory Visit: Payer: Self-pay | Admitting: Family Medicine

## 2019-03-31 DIAGNOSIS — G8929 Other chronic pain: Secondary | ICD-10-CM

## 2019-03-31 DIAGNOSIS — M2550 Pain in unspecified joint: Principal | ICD-10-CM

## 2019-04-09 ENCOUNTER — Telehealth: Payer: Self-pay | Admitting: *Deleted

## 2019-04-09 ENCOUNTER — Ambulatory Visit: Payer: Medicare Other | Admitting: Vascular Surgery

## 2019-04-09 NOTE — Telephone Encounter (Signed)
Received patient's Prolia from [facility].  Left patient a voicemail to call to schedule a time to come in for injection.

## 2019-04-10 ENCOUNTER — Other Ambulatory Visit: Payer: Self-pay

## 2019-04-10 ENCOUNTER — Ambulatory Visit (INDEPENDENT_AMBULATORY_CARE_PROVIDER_SITE_OTHER): Payer: Medicare Other | Admitting: *Deleted

## 2019-04-10 DIAGNOSIS — M8008XS Age-related osteoporosis with current pathological fracture, vertebra(e), sequela: Secondary | ICD-10-CM

## 2019-04-10 DIAGNOSIS — M810 Age-related osteoporosis without current pathological fracture: Secondary | ICD-10-CM | POA: Diagnosis not present

## 2019-04-10 MED ORDER — DENOSUMAB 60 MG/ML ~~LOC~~ SOSY
60.0000 mg | PREFILLED_SYRINGE | Freq: Once | SUBCUTANEOUS | Status: AC
  Administered 2019-04-10: 60 mg via SUBCUTANEOUS

## 2019-04-14 ENCOUNTER — Other Ambulatory Visit: Payer: Self-pay | Admitting: Family Medicine

## 2019-04-14 DIAGNOSIS — K219 Gastro-esophageal reflux disease without esophagitis: Secondary | ICD-10-CM

## 2019-04-14 MED ORDER — OMEPRAZOLE 20 MG PO CPDR: 20.0000 mg | DELAYED_RELEASE_CAPSULE | Freq: Two times a day (BID) | ORAL | 3 refills | Status: DC

## 2019-04-15 NOTE — Telephone Encounter (Signed)
Rx has been faxed to [facility]. °

## 2019-04-23 ENCOUNTER — Other Ambulatory Visit: Payer: Self-pay | Admitting: Family Medicine

## 2019-04-23 DIAGNOSIS — K219 Gastro-esophageal reflux disease without esophagitis: Secondary | ICD-10-CM

## 2019-05-02 ENCOUNTER — Other Ambulatory Visit: Payer: Self-pay | Admitting: Family Medicine

## 2019-05-02 DIAGNOSIS — K219 Gastro-esophageal reflux disease without esophagitis: Secondary | ICD-10-CM

## 2019-05-05 ENCOUNTER — Encounter: Payer: Self-pay | Admitting: Family

## 2019-05-05 ENCOUNTER — Other Ambulatory Visit: Payer: Self-pay

## 2019-05-05 ENCOUNTER — Ambulatory Visit (INDEPENDENT_AMBULATORY_CARE_PROVIDER_SITE_OTHER): Payer: Medicare Other | Admitting: Family

## 2019-05-05 ENCOUNTER — Ambulatory Visit (HOSPITAL_COMMUNITY)
Admission: RE | Admit: 2019-05-05 | Discharge: 2019-05-05 | Disposition: A | Discharge status: Home / Self Care | Payer: Medicare Other | Source: Ambulatory Visit | Attending: Family | Admitting: Family

## 2019-05-05 ENCOUNTER — Other Ambulatory Visit: Payer: Self-pay | Admitting: Family

## 2019-05-05 DIAGNOSIS — M79605 Pain in left leg: Secondary | ICD-10-CM

## 2019-05-05 DIAGNOSIS — G8929 Other chronic pain: Secondary | ICD-10-CM

## 2019-05-05 MED ORDER — DICLOFENAC SODIUM 1 % TD GEL: 2.0000 g | Freq: Four times a day (QID) | TRANSDERMAL | 5 refills | Status: DC

## 2019-05-05 NOTE — Progress Notes (Signed)
   Virtual Visit via telephone Note  I connected with Danielle Parrish on 05/05/19 at 9:39 AM by telephone and verified that I am speaking with the correct person using two identifiers. Danielle Parrish is currently located at [employer] home  and no one  is currently with her during visit. The provider, Evelina Dun, FNP is located in their office at time of visit.  I discussed the limitations, risks, security and privacy concerns of performing an evaluation and management service by telephone and the availability of in person appointments. I also discussed with the patient that there may be a patient responsible charge related to this service. The patient expressed understanding and agreed to proceed.   History and Present Illness:  Pt calls the office today with left lower leg pain that started 3 days ago. Denies any redness or swelling, but does states she has had warmth.  Leg Pain   The incident occurred 5 to 7 days ago. There was no injury mechanism. The pain is present in the left leg. The quality of the pain is described as aching. The pain is at a severity of 8/10. The pain is moderate. The pain has been constant since onset. Pertinent negatives include no inability to bear weight, numbness or tingling. She reports no foreign bodies present. The symptoms are aggravated by weight bearing and movement. She has tried rest and acetaminophen for the symptoms. The treatment provided mild relief.      Review of Systems  Neurological: Negative for tingling and numbness.  All other systems reviewed and are negative.    Observations/Objective: No SOB or distress noted  Assessment and Plan: 1. Left leg pain Will send for stat doppler to rule out DVT Report any SOB or chest pain Will follow up according to results of doppler - US Venous Img Lower Unilateral Left; Future     I discussed the assessment and treatment plan with the patient. The patient was provided an opportunity to  ask questions and all were answered. The patient agreed with the plan and demonstrated an understanding of the instructions.   The patient was advised to call back or seek an in-person evaluation if the symptoms worsen or if the condition fails to improve as anticipated.  The above assessment and management plan was discussed with the patient. The patient verbalized understanding of and has agreed to the management plan. Patient is aware to call the clinic if symptoms persist or worsen. Patient is aware when to return to the clinic for a follow-up visit. Patient educated on when it is appropriate to go to the emergency department.   Time call ended:  9:52 AM  I provided 13 minutes of non-face-to-face time during this encounter.    Evelina Dun, FNP

## 2019-05-22 ENCOUNTER — Other Ambulatory Visit: Payer: Self-pay | Admitting: Family Medicine

## 2019-05-22 DIAGNOSIS — K219 Gastro-esophageal reflux disease without esophagitis: Secondary | ICD-10-CM

## 2019-05-27 ENCOUNTER — Other Ambulatory Visit: Payer: Self-pay | Admitting: Vascular Surgery

## 2019-05-27 DIAGNOSIS — I83892 Varicose veins of left lower extremities with other complications: Secondary | ICD-10-CM

## 2019-06-04 ENCOUNTER — Ambulatory Visit (HOSPITAL_COMMUNITY): Admission: RE | Admit: 2019-06-04 | Payer: Medicare Other | Source: Ambulatory Visit

## 2019-06-05 ENCOUNTER — Other Ambulatory Visit: Payer: Self-pay | Admitting: Family Medicine

## 2019-06-05 DIAGNOSIS — K219 Gastro-esophageal reflux disease without esophagitis: Secondary | ICD-10-CM

## 2019-06-05 MED ORDER — OMEPRAZOLE 20 MG PO CPDR: 20.0000 mg | DELAYED_RELEASE_CAPSULE | Freq: Two times a day (BID) | ORAL | 0 refills | Status: DC

## 2019-06-05 NOTE — Addendum Note (Signed)
Addended by: Antonietta Barcelona D on: 06/05/2019 12:22 PM   Modules accepted: Orders

## 2019-06-05 NOTE — Telephone Encounter (Signed)
Refill failed, resent 

## 2019-06-06 ENCOUNTER — Telehealth: Payer: Medicare Other | Admitting: *Deleted

## 2019-06-08 ENCOUNTER — Other Ambulatory Visit: Payer: Self-pay | Admitting: Family Medicine

## 2019-06-08 DIAGNOSIS — B3749 Other urogenital candidiasis: Secondary | ICD-10-CM

## 2019-06-10 ENCOUNTER — Other Ambulatory Visit: Payer: Self-pay | Admitting: Family Medicine

## 2019-06-10 DIAGNOSIS — M8000XS Age-related osteoporosis with current pathological fracture, unspecified site, sequela: Secondary | ICD-10-CM

## 2019-06-10 DIAGNOSIS — K219 Gastro-esophageal reflux disease without esophagitis: Secondary | ICD-10-CM

## 2019-06-11 ENCOUNTER — Other Ambulatory Visit: Payer: Self-pay

## 2019-06-11 ENCOUNTER — Ambulatory Visit (HOSPITAL_COMMUNITY)
Admission: RE | Admit: 2019-06-11 | Discharge: 2019-06-11 | Disposition: A | Discharge status: Home / Self Care | Payer: Medicare Other | Source: Ambulatory Visit | Attending: Vascular Surgery | Admitting: Vascular Surgery

## 2019-06-11 ENCOUNTER — Other Ambulatory Visit: Payer: Medicare Other

## 2019-06-11 DIAGNOSIS — I83892 Varicose veins of left lower extremities with other complications: Secondary | ICD-10-CM | POA: Insufficient documentation

## 2019-06-11 LAB — POCT I-STAT CREATININE: Creatinine, Ser: 0.8 mg/dL (ref 0.44–1.00)

## 2019-06-11 MED ORDER — IOHEXOL 350 MG/ML SOLN
100.0000 mL | Freq: Once | INTRAVENOUS | Status: AC | PRN
  Administered 2019-06-11: 100 mL via INTRAVENOUS

## 2019-06-16 ENCOUNTER — Encounter: Payer: Self-pay | Admitting: Family Medicine

## 2019-06-16 ENCOUNTER — Other Ambulatory Visit: Payer: Self-pay

## 2019-06-16 ENCOUNTER — Ambulatory Visit (INDEPENDENT_AMBULATORY_CARE_PROVIDER_SITE_OTHER): Payer: Medicare Other | Admitting: Family Medicine

## 2019-06-16 DIAGNOSIS — Z91199 Patient's noncompliance with other medical treatment and regimen due to unspecified reason: Secondary | ICD-10-CM | POA: Insufficient documentation

## 2019-06-16 DIAGNOSIS — K219 Gastro-esophageal reflux disease without esophagitis: Secondary | ICD-10-CM | POA: Diagnosis not present

## 2019-06-16 DIAGNOSIS — E785 Hyperlipidemia, unspecified: Secondary | ICD-10-CM

## 2019-06-16 DIAGNOSIS — R062 Wheezing: Secondary | ICD-10-CM | POA: Diagnosis not present

## 2019-06-16 DIAGNOSIS — F339 Major depressive disorder, recurrent, unspecified: Secondary | ICD-10-CM | POA: Diagnosis not present

## 2019-06-16 DIAGNOSIS — E1159 Type 2 diabetes mellitus with other circulatory complications: Secondary | ICD-10-CM

## 2019-06-16 DIAGNOSIS — Z5329 Procedure and treatment not carried out because of patient's decision for other reasons: Secondary | ICD-10-CM | POA: Insufficient documentation

## 2019-06-16 DIAGNOSIS — E7800 Pure hypercholesterolemia, unspecified: Secondary | ICD-10-CM

## 2019-06-16 DIAGNOSIS — M8008XS Age-related osteoporosis with current pathological fracture, vertebra(e), sequela: Secondary | ICD-10-CM

## 2019-06-16 DIAGNOSIS — I152 Hypertension secondary to endocrine disorders: Secondary | ICD-10-CM

## 2019-06-16 DIAGNOSIS — I5032 Chronic diastolic (congestive) heart failure: Secondary | ICD-10-CM

## 2019-06-16 DIAGNOSIS — E1169 Type 2 diabetes mellitus with other specified complication: Secondary | ICD-10-CM

## 2019-06-16 DIAGNOSIS — I10 Essential (primary) hypertension: Secondary | ICD-10-CM

## 2019-06-16 DIAGNOSIS — E039 Hypothyroidism, unspecified: Secondary | ICD-10-CM

## 2019-06-16 MED ORDER — OMEPRAZOLE 20 MG PO CPDR: 20.0000 mg | DELAYED_RELEASE_CAPSULE | Freq: Two times a day (BID) | ORAL | 0 refills | Status: DC

## 2019-06-16 MED ORDER — SERTRALINE HCL 100 MG PO TABS: 200.0000 mg | ORAL_TABLET | Freq: Every day | ORAL | 3 refills | Status: DC

## 2019-06-16 MED ORDER — ALBUTEROL SULFATE HFA 108 (90 BASE) MCG/ACT IN AERS: 2.0000 | INHALATION_SPRAY | Freq: Four times a day (QID) | RESPIRATORY_TRACT | 1 refills | Status: DC | PRN

## 2019-06-16 NOTE — Progress Notes (Signed)
Virtual Visit via telephone Note Due to COVID-19, visit is conducted virtually and was requested by patient. This visit type was conducted due to national recommendations for restrictions regarding the COVID-19 Pandemic (e.g. social distancing) in an effort to limit this patient's exposure and mitigate transmission in our community. All issues noted in this document were discussed and addressed.  A physical exam was not performed with this format.   I connected with Danielle Parrish on 06/16/19 at 0900 by telephone and verified that I am speaking with the correct person using two identifiers. Danielle Parrish is currently located at home and no one is currently with them during visit. The provider, Monia Pouch, FNP is located in their office at time of visit.  I discussed the limitations, risks, security and privacy concerns of performing an evaluation and management service by telephone and the availability of in person appointments. I also discussed with the patient that there may be a patient responsible charge related to this service. The patient expressed understanding and agreed to proceed.  Subjective:  Patient ID: Danielle Parrish, female    DOB: Apr 29, 1941, 78 y.o.   MRN: [id number]  Chief Complaint:  Medical Management of Chronic Issues   HPI: Danielle Parrish is a 78 y.o. female presenting on 06/16/2019 for Medical Management of Chronic Issues   Pt presents today for management of chronic medical conditions. Pt states she continues to have the left lower abdominal pain. States she had a CT completed and is to follow up with Dr. Doren Custard on Wednesday for this. Pt states she has also developed intermittent wheezing. She denies cough or sputum production. She is a nonsmoker. No fever, chills, or hemoptysis. No chest pain or palpitations. States she does have some exertional shortness of breath. No resting shortness of breath. She states she has not tried anything for the symptoms.    1. Wheezing This is a new problem. Started several days ago and is worse with exertion. No resting shortness of breath or wheezing. No fever, chills, cough, or chest pain. No palpitations.   2. Gastroesophageal reflux disease without esophagitis Well controlled with current medications. No hemoptysis, cough, voice changes, melena, hematochezia, or dysphagia.   3. Depression, recurrent ([place]) States she does feel a little down at times. States she does have increased fatigue. No SI or HI. Is compliant with medications.   4. Hypertension associated with type 2 diabetes mellitus ([place]) Complaint with meds - Yes Checking BP at home ranging 140/80 Exercising Regularly - No Watching Salt intake - No Pertinent ROS:  Headache - No Chest pain - No Dyspnea - No Palpitations - No LE edema - No They report good compliance with medications and can restate their regimen by memory. No medication side effects.  BP Readings from Last 3 Encounters: 03/13/19 : 139/70 01/30/19 : (!) 168/70 01/01/19 : (!) 156/84   5. Chronic diastolic heart failure (HCC) States lower extremity swelling in the evenings. No resting shortness of breath, no weight gain, orthopnea, or PND. No chest pain, palpitations, or syncope.   6. Hyperlipidemia associated with type 2 diabetes mellitus ([place]) Compliant with medications. No associated adverse reactions. Does not watch diet or exercise on a regular basis.   7. Type 2 diabetes mellitus with hypercholesterolemia ([place]) Diet controlled. Does not check blood sugars at home. Last A1C was 5.8.   8. Acquired hypothyroidism Complaining of increased fatigue and malaise. No constipation or diarrhea. No significant weight changes.   9. Age-related osteoporosis  with current pathological fracture of vertebra, sequela Has had first prolia injection and tolerated it well. No jaw pain, loose teeth, or trouble chewing. Does take calcium daily.     Depression screen [facility] 2/9  06/16/2019 03/13/2019 01/30/2019 01/01/2019 11/19/2018  Decreased Interest 2 1 1 1  0  Down, Depressed, Hopeless 1 2 1 1 1   PHQ - 2 Score 3 3 2 2 1   Altered sleeping 2 0 1 0 -  Tired, decreased energy 3 3 1 1  -  Change in appetite 0 1 1 2  -  Feeling bad or failure about yourself  0 0 1 1 -  Trouble concentrating 0 1 1 1  -  Moving slowly or fidgety/restless 1 1 1  0 -  Suicidal thoughts 0 0 1 1 -  PHQ-9 Score 9 9 9 8  -  Difficult doing work/chores - - Not difficult at all Somewhat difficult -  Some recent data might be hidden      Relevant past medical, surgical, family, and social history reviewed and updated as indicated.  Allergies and medications reviewed and updated.   Past Medical History:  Diagnosis Date  . Anxiety   . Arthritis   . CHF (congestive heart failure) ([place])   . Chronic pain of multiple joints   . Depression   . Essential hypertension   . GERD (gastroesophageal reflux disease)   . Hyperlipidemia   . Hyperlipidemia associated with type 2 diabetes mellitus ([place])   . Hypertension associated with type 2 diabetes mellitus ([place])   . Hypothyroidism   . Osteoporosis   . Sleep apnea    CPAP  . Type 2 diabetes mellitus with hypercholesterolemia ([place])   . Vitamin D deficiency     Past Surgical History:  Procedure Laterality Date  . ABDOMINAL HERNIA REPAIR    . ABDOMINAL HYSTERECTOMY  1977  . BREAST EXCISIONAL BIOPSY Right   . KNEE ARTHROSCOPY Left 09/30/2014   Procedure: LEFT KNEE ARTHROSCOPY MEDIAL MENISECTOMY ABRASION CHONDROPLASTY SYNOVECTOMY SUPRAPATELLER CUFF;  Surgeon: Tobi Bastos, MD;  Location: [facility];  Service: Orthopedics;  Laterality: Left;    Social History   Socioeconomic History  . Marital status: Widowed    Spouse name: Laverna Peace  . Number of children: 2  . Years of education: Not on file  . Highest education level: 12th grade  Occupational History  . Occupation: CNA    Comment: [employer]  . Financial resource strain: Very  hard  . Food insecurity    Worry: Sometimes true    Inability: Sometimes true  . Transportation needs    Medical: No    Non-medical: No  Tobacco Use  . Smoking status: Never Smoker  . Smokeless tobacco: Never Used  Substance and Sexual Activity  . Alcohol use: No  . Drug use: No  . Sexual activity: Not Currently  Lifestyle  . Physical activity    Days per week: 0 days    Minutes per session: 0 min  . Stress: Not at all  Relationships  . Social connections    Talks on phone: More than three times a week    Gets together: More than three times a week    Attends religious service: Never    Active member of club or organization: No    Attends meetings of clubs or organizations: Never    Relationship status: Widowed  . Intimate partner violence    Fear of current or ex partner: No    Emotionally abused: No  Physically abused: No    Forced sexual activity: No  Other Topics Concern  . Not on file  Social History Narrative  . Not on file    Outpatient Encounter Medications as of 06/16/2019  Medication Sig  . albuterol (VENTOLIN HFA) 108 (90 Base) MCG/ACT inhaler Inhale 2 puffs into the lungs every 6 (six) hours as needed for wheezing or shortness of breath.  Marland Kitchen amLODipine (NORVASC) 10 MG tablet Take 1 tablet (10 mg total) by mouth daily.  Marland Kitchen aspirin 81 MG chewable tablet Chew 4 tablets by mouth daily.   . Biotin 10 MG CAPS Take 1 capsule by mouth daily.   . calcium-vitamin D (OSCAL WITH D) 500-200 MG-UNIT TABS tablet TAKE 1 TABLET BY MOUTH EVERY DAY WITH BREAKFAST  . Cholecalciferol (VITAMIN D-3 PO) Take 1 capsule by mouth daily.   Marland Kitchen denosumab (PROLIA) 60 MG/ML SOSY injection Bring to your doctor to inject 60 mg subcutaneously.  . diclofenac sodium (VOLTAREN) 1 % GEL Apply 2 g topically 4 (four) times daily.  . diphenhydrAMINE (BENADRYL) 25 MG tablet Take 50 mg by mouth at bedtime as needed for allergies.   Marland Kitchen gabapentin (NEURONTIN) 100 MG capsule TAKE 1 CAPSULE (100 MG TOTAL)  BY MOUTH 4 (FOUR) TIMES DAILY AS NEEDED.  Marland Kitchen ketoconazole (NIZORAL) 2 % cream APPLY TO AFFECTED AREA EVERY DAY  . levothyroxine (SYNTHROID, LEVOTHROID) 100 MCG tablet Take 1 Tablet by mouth once daily BEFORE BREAKFAST  . lisinopril (PRINIVIL,ZESTRIL) 10 MG tablet Take 1 tablet (10 mg total) by mouth daily.  Marland Kitchen lovastatin (MEVACOR) 40 MG tablet Take 1 tablet (40 mg total) by mouth at bedtime.  . Multiple Vitamins-Minerals (MULTIVITAMIN ADULT) CHEW Chew 2 tablets by mouth daily.  . mupirocin ointment (BACTROBAN) 2 % Apply 1 application topically 2 (two) times daily.  Marland Kitchen nystatin (MYCOSTATIN/NYSTOP) powder Apply topically 4 (four) times daily.  Marland Kitchen nystatin cream (MYCOSTATIN) Apply 1 application topically 2 (two) times daily.  Marland Kitchen omeprazole (PRILOSEC) 20 MG capsule Take 1 capsule (20 mg total) by mouth 2 (two) times daily before a meal.  . pyridOXINE (VITAMIN B-6) 100 MG tablet Take 100 mg by mouth daily.  . sertraline (ZOLOFT) 100 MG tablet Take 2 tablets (200 mg total) by mouth daily for 30 days.  . sodium chloride (OCEAN) 0.65 % SOLN nasal spray Place 1 spray into both nostrils as needed for congestion.  . [DISCONTINUED] alendronate (FOSAMAX) 70 MG tablet Take 1 tablet (70 mg total) by mouth every 7 (seven) days. Take with a full glass of water on an empty stomach.  . [DISCONTINUED] omeprazole (PRILOSEC) 20 MG capsule Take 1 capsule (20 mg total) by mouth 2 (two) times daily before a meal.  . [DISCONTINUED] sertraline (ZOLOFT) 100 MG tablet Take 2 tablets (200 mg total) by mouth daily for 30 days.   No facility-administered encounter medications on file as of 06/16/2019.     Allergies  Allergen Reactions  . Crestor [Rosuvastatin] Other (See Comments)    Body aches.   . Nsaids     Burns stomach.   . Penicillins Hives    Has patient had a PCN reaction causing immediate rash, facial/tongue/throat swelling, SOB or lightheadedness with hypotension: Yes Has patient had a PCN reaction causing severe  rash involving mucus membranes or skin necrosis: unknown Has patient had a PCN reaction that required hospitalization No Has patient had a PCN reaction occurring within the last 10 years: No If all of the above answers are "NO", then may proceed with  Cephalosporin use.   . Vytorin [Ezetimibe-Simvastatin]     Body aches.   . Tolmetin Nausea Only    Burns stomach.     Review of Systems  Constitutional: Positive for fatigue. Negative for activity change, appetite change, chills, fever and unexpected weight change.  HENT: Negative for congestion.   Eyes: Negative for photophobia and visual disturbance.  Respiratory: Positive for shortness of breath (exertional) and wheezing. Negative for cough and chest tightness.   Cardiovascular: Negative for chest pain, palpitations and leg swelling.  Gastrointestinal: Negative for abdominal distention, abdominal pain, anal bleeding, blood in stool, constipation, diarrhea, nausea, rectal pain and vomiting.  Endocrine: Negative for cold intolerance, heat intolerance, polydipsia, polyphagia and polyuria.  Genitourinary: Negative for decreased urine volume and difficulty urinating.  Musculoskeletal: Positive for arthralgias, back pain, gait problem and myalgias. Negative for joint swelling, neck pain and neck stiffness.  Skin: Negative for color change and pallor.  Neurological: Negative for dizziness, tremors, seizures, syncope, facial asymmetry, speech difficulty, weakness, light-headedness, numbness and headaches.  Hematological: Does not bruise/bleed easily.  Psychiatric/Behavioral: Positive for dysphoric mood and sleep disturbance. Negative for confusion.  All other systems reviewed and are negative.        Observations/Objective: No vital signs or physical exam, this was a telephone or virtual health encounter.  Pt alert and oriented, answers all questions appropriately, and able to speak in full sentences.    Assessment and Plan: Danielle Parrish was seen  today for medical management of chronic issues.  Diagnoses and all orders for this visit:  Wheezing New exertional wheezing. No other associated symptoms. Will trial below as needed for wheezing. Follow up in 4 weeks for PFT in office. Pt aware not to use inhaler the day before and day of appointment.  -     albuterol (VENTOLIN HFA) 108 (90 Base) MCG/ACT inhaler; Inhale 2 puffs into the lungs every 6 (six) hours as needed for wheezing or shortness of breath.  Gastroesophageal reflux disease without esophagitis Well controlled. Continue below.  -     omeprazole (PRILOSEC) 20 MG capsule; Take 1 capsule (20 mg total) by mouth 2 (two) times daily before a meal.  Depression, recurrent (HCC) Stable. Continue below.  -     sertraline (ZOLOFT) 100 MG tablet; Take 2 tablets (200 mg total) by mouth daily for 30 days.  Hypertension associated with type 2 diabetes mellitus ([place]) No refills needed today. Well controlled, continue current medications. Will see in office in 4 weeks. Diet and exercise encouraged.   Chronic diastolic heart failure (HCC) No increased leg swelling, orthopnea, PND, or resting SHOB. Does have leg swelling in the evenings. Report any new or worsening symptoms or weight gain of 3 pounds in 1 day of 5 pounds in 1 week.   Hyperlipidemia associated with type 2 diabetes mellitus ([place]) Diet and exercise encouraged. Medications as prescribed.   Type 2 diabetes mellitus with hypercholesterolemia (HCC) Diet controlled. Will recheck A1C in 4 weeks.   Acquired hypothyroidism Increased fatigue, will recheck TSH in 4 weeks and adjust therapy if warranted.   Age-related osteoporosis with current pathological fracture of vertebra, sequela Continue Prolia and calcium repletion. Report any jaw pain, loose teeth, or trouble swallowing.     Follow Up Instructions: Return in about 4 weeks (around 07/14/2019), or if symptoms worsen or fail to improve, for Wheezing.    I discussed the  assessment and treatment plan with the patient. The patient was provided an opportunity to ask questions and all were  answered. The patient agreed with the plan and demonstrated an understanding of the instructions.   The patient was advised to call back or seek an in-person evaluation if the symptoms worsen or if the condition fails to improve as anticipated.  The above assessment and management plan was discussed with the patient. The patient verbalized understanding of and has agreed to the management plan. Patient is aware to call the clinic if symptoms persist or worsen. Patient is aware when to return to the clinic for a follow-up visit. Patient educated on when it is appropriate to go to the emergency department.    I provided 25 minutes of non-face-to-face time during this encounter. The call started at 0900. The call ended at 0920. The other time was used for coordination of care.    Monia Pouch, FNP-C [facility] [address] [place], [place] [address] [phone]

## 2019-06-18 ENCOUNTER — Ambulatory Visit (INDEPENDENT_AMBULATORY_CARE_PROVIDER_SITE_OTHER): Payer: Medicare Other | Admitting: Vascular Surgery

## 2019-06-18 ENCOUNTER — Other Ambulatory Visit: Payer: Self-pay

## 2019-06-18 ENCOUNTER — Encounter: Payer: Self-pay | Admitting: Vascular Surgery

## 2019-06-18 VITALS — BP 143/90 | HR 79 | Temp 98.1°F | Resp 14 | Ht 63.0 in | Wt 187.9 lb

## 2019-06-18 DIAGNOSIS — I776 Arteritis, unspecified: Secondary | ICD-10-CM | POA: Diagnosis not present

## 2019-06-18 NOTE — Progress Notes (Signed)
Patient name: Danielle Parrish MRN: [id number] DOB: Mar 08, 1941 Sex: female  REASON FOR VISIT:   Follow-up of periaortitis.  HPI:   Danielle Parrish is a pleasant 78 y.o. female who I saw in consultation on 10/30/2018 with periaortitis.  The patient had been seen at a referring emergency department with right flank pain.  This prompted a CT the abdomen and pelvis and an incidental finding was periaortitis and some regional retroperitoneal adenopathy at the level of the suprarenal aorta.  This resulted in some mild narrowing of the left renal artery.  When I saw her in consultation her right flank symptoms had resolved.  That time the patient did not have any symptoms of fever chills or weight loss.  As the etiology of these findings were not clear I did recommend a follow-up CT scan in 6 months.  She comes in for a follow-up visit.  Since I saw her last, she denies any abdominal pain or back pain.  Lately she has been feeling tired but denies any fever, chills, or night sweats.  She is had no weight loss.  She has gained weight.  She has had no postprandial abdominal pain.  She denies claudication, rest pain, or nonhealing ulcers.  Past Medical History:  Diagnosis Date  . Anxiety   . Arthritis   . CHF (congestive heart failure) ([place])   . Chronic pain of multiple joints   . Depression   . Essential hypertension   . GERD (gastroesophageal reflux disease)   . Hyperlipidemia   . Hyperlipidemia associated with type 2 diabetes mellitus ([place])   . Hypertension associated with type 2 diabetes mellitus ([place])   . Hypothyroidism   . Osteoporosis   . Sleep apnea    CPAP  . Type 2 diabetes mellitus with hypercholesterolemia ([place])   . Vitamin D deficiency     Family History  Problem Relation Age of Onset  . Hypertension Mother   . Stroke Mother   . Cancer Father     SOCIAL HISTORY: Social History   Tobacco Use  . Smoking status: Never Smoker  . Smokeless tobacco: Never Used   Substance Use Topics  . Alcohol use: No    Allergies  Allergen Reactions  . Crestor [Rosuvastatin] Other (See Comments)    Body aches.   . Nsaids     Burns stomach.   . Penicillins Hives    Has patient had a PCN reaction causing immediate rash, facial/tongue/throat swelling, SOB or lightheadedness with hypotension: Yes Has patient had a PCN reaction causing severe rash involving mucus membranes or skin necrosis: unknown Has patient had a PCN reaction that required hospitalization No Has patient had a PCN reaction occurring within the last 10 years: No If all of the above answers are "NO", then may proceed with Cephalosporin use.   . Vytorin [Ezetimibe-Simvastatin]     Body aches.   . Tolmetin Nausea Only    Burns stomach.     Current Outpatient Medications  Medication Sig Dispense Refill  . albuterol (VENTOLIN HFA) 108 (90 Base) MCG/ACT inhaler Inhale 2 puffs into the lungs every 6 (six) hours as needed for wheezing or shortness of breath. 18 g 1  . amLODipine (NORVASC) 10 MG tablet Take 1 tablet (10 mg total) by mouth daily. 90 tablet 1  . aspirin 81 MG chewable tablet Chew 4 tablets by mouth daily.     . Biotin 10 MG CAPS Take 1 capsule by mouth daily.     Marland Kitchen  calcium-vitamin D (OSCAL WITH D) 500-200 MG-UNIT TABS tablet TAKE 1 TABLET BY MOUTH EVERY DAY WITH BREAKFAST 90 tablet 0  . Cholecalciferol (VITAMIN D-3 PO) Take 1 capsule by mouth daily.     Marland Kitchen denosumab (PROLIA) 60 MG/ML SOSY injection Bring to your doctor to inject 60 mg subcutaneously. 1 Syringe 0  . diclofenac sodium (VOLTAREN) 1 % GEL Apply 2 g topically 4 (four) times daily. 100 g 5  . diphenhydrAMINE (BENADRYL) 25 MG tablet Take 50 mg by mouth at bedtime as needed for allergies.     Marland Kitchen ketoconazole (NIZORAL) 2 % cream APPLY TO AFFECTED AREA EVERY DAY 60 g 2  . levothyroxine (SYNTHROID, LEVOTHROID) 100 MCG tablet Take 1 Tablet by mouth once daily BEFORE BREAKFAST 90 tablet 1  . lisinopril (PRINIVIL,ZESTRIL) 10 MG  tablet Take 1 tablet (10 mg total) by mouth daily. 90 tablet 3  . lovastatin (MEVACOR) 40 MG tablet Take 1 tablet (40 mg total) by mouth at bedtime. 90 tablet 3  . Multiple Vitamins-Minerals (MULTIVITAMIN ADULT) CHEW Chew 2 tablets by mouth daily.    . mupirocin ointment (BACTROBAN) 2 % Apply 1 application topically 2 (two) times daily. 22 g 0  . nystatin (MYCOSTATIN/NYSTOP) powder Apply topically 4 (four) times daily. 15 g 0  . nystatin cream (MYCOSTATIN) Apply 1 application topically 2 (two) times daily. 30 g PRN  . omeprazole (PRILOSEC) 20 MG capsule Take 1 capsule (20 mg total) by mouth 2 (two) times daily before a meal. 180 capsule 0  . pyridOXINE (VITAMIN B-6) 100 MG tablet Take 100 mg by mouth daily.    . sertraline (ZOLOFT) 100 MG tablet Take 2 tablets (200 mg total) by mouth daily for 30 days. 60 tablet 3  . sodium chloride (OCEAN) 0.65 % SOLN nasal spray Place 1 spray into both nostrils as needed for congestion. 1 Bottle prn  . gabapentin (NEURONTIN) 100 MG capsule TAKE 1 CAPSULE (100 MG TOTAL) BY MOUTH 4 (FOUR) TIMES DAILY AS NEEDED. 120 capsule 1   No current facility-administered medications for this visit.     REVIEW OF SYSTEMS:  [X]  denotes positive finding, [ ]  denotes negative finding Cardiac  Comments:  Chest pain or chest pressure:    Shortness of breath upon exertion: x   Short of breath when lying flat:    Irregular heart rhythm:        Vascular    Pain in calf, thigh, or hip brought on by ambulation:    Pain in feet at night that wakes you up from your sleep:     Blood clot in your veins:    Leg swelling:         Pulmonary    Oxygen at home:    Productive cough:     Wheezing:  x       Neurologic    Sudden weakness in arms or legs:  x   Sudden numbness in arms or legs:     Sudden onset of difficulty speaking or slurred speech:    Temporary loss of vision in one eye:     Problems with dizziness:         Gastrointestinal    Blood in stool:     Vomited  blood:         Genitourinary    Burning when urinating:     Blood in urine:        Psychiatric    Major depression:  Hematologic    Bleeding problems:    Problems with blood clotting too easily:        Skin    Rashes or ulcers:        Constitutional    Fever or chills:     PHYSICAL EXAM:   Vitals:   06/18/19 1239  BP: (!) 143/90  Pulse: 79  Resp: 14  Temp: 98.1 F (36.7 C)  TempSrc: Oral  SpO2: 91%  Weight: 187 lb 14.4 oz (85.2 kg)  Height: 5\' 3"  (1.6 m)    GENERAL: The patient is a well-nourished female, in no acute distress. The vital signs are documented above. CARDIAC: There is a regular rate and rhythm.  VASCULAR: I do not detect carotid bruits. She has palpable femoral and pedal pulses bilaterally. She has no significant lower extremity swelling. PULMONARY: There is good air exchange bilaterally without wheezing or rales. ABDOMEN: Soft and non-tender with normal pitched bowel sounds.  MUSCULOSKELETAL: There are no major deformities or cyanosis. NEUROLOGIC: No focal weakness or paresthesias are detected. SKIN: There are no ulcers or rashes noted. PSYCHIATRIC: The patient has a normal affect.  DATA:    CT ANGIOGRAM ABDOMEN PELVIS: I have reviewed the CT of the abdomen and pelvis that was done on 06/11/2019.  This shows improved appearance of the previously noted aortitis with a minimal amount of residual periaortic stranding.  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  The IVC and pelvic venous system are patent.  The prominent retroperitoneal and pelvic lymph nodes are unchanged compared the study in October 2019.   MEDICAL ISSUES:   PERIAORTITIS: She was found to have periaortitis is an incidental finding on a CT scan 6 months ago.  The etiology of this was not clear therefore I recommended a follow-up scan.  This shows significant improvement in the peri-aortitis.  She does have some prominent  retroperitoneal and pelvic lymph nodes but these are unchanged in size and more likely reactive.  She is not a smoker.  Given the significant improvement on CT scan I think it would be reasonable to get a follow-up scan in 3 years just to be sure that this has completely resolved.  I do not think this is something we can follow with ultrasound.  Certainly if she had issues with weight loss, night sweats, or chills we could consider CT scan sooner than 3 years to follow-up the adenopathy.  She knows to call sooner if she has problems.  Deitra Mayo [facility] [phone]

## 2019-07-15 ENCOUNTER — Ambulatory Visit (INDEPENDENT_AMBULATORY_CARE_PROVIDER_SITE_OTHER): Payer: Medicare Other | Admitting: Family Medicine

## 2019-07-15 ENCOUNTER — Encounter: Payer: Self-pay | Admitting: Family Medicine

## 2019-07-15 ENCOUNTER — Other Ambulatory Visit: Payer: Self-pay

## 2019-07-15 ENCOUNTER — Telehealth: Payer: Self-pay | Admitting: Family Medicine

## 2019-07-15 DIAGNOSIS — K219 Gastro-esophageal reflux disease without esophagitis: Secondary | ICD-10-CM

## 2019-07-15 DIAGNOSIS — I10 Essential (primary) hypertension: Secondary | ICD-10-CM

## 2019-07-15 DIAGNOSIS — I5032 Chronic diastolic (congestive) heart failure: Secondary | ICD-10-CM | POA: Diagnosis not present

## 2019-07-15 DIAGNOSIS — E1169 Type 2 diabetes mellitus with other specified complication: Secondary | ICD-10-CM

## 2019-07-15 DIAGNOSIS — F339 Major depressive disorder, recurrent, unspecified: Secondary | ICD-10-CM

## 2019-07-15 DIAGNOSIS — E7800 Pure hypercholesterolemia, unspecified: Secondary | ICD-10-CM

## 2019-07-15 DIAGNOSIS — E1159 Type 2 diabetes mellitus with other circulatory complications: Secondary | ICD-10-CM | POA: Diagnosis not present

## 2019-07-15 DIAGNOSIS — I152 Hypertension secondary to endocrine disorders: Secondary | ICD-10-CM

## 2019-07-15 DIAGNOSIS — M2550 Pain in unspecified joint: Secondary | ICD-10-CM

## 2019-07-15 DIAGNOSIS — E785 Hyperlipidemia, unspecified: Secondary | ICD-10-CM

## 2019-07-15 DIAGNOSIS — G4733 Obstructive sleep apnea (adult) (pediatric): Secondary | ICD-10-CM | POA: Diagnosis not present

## 2019-07-15 DIAGNOSIS — G8929 Other chronic pain: Secondary | ICD-10-CM

## 2019-07-15 DIAGNOSIS — E039 Hypothyroidism, unspecified: Secondary | ICD-10-CM

## 2019-07-15 MED ORDER — LOVASTATIN 40 MG PO TABS: 40.0000 mg | ORAL_TABLET | Freq: Every day | ORAL | 3 refills | Status: DC

## 2019-07-15 MED ORDER — LISINOPRIL 10 MG PO TABS: 10.0000 mg | ORAL_TABLET | Freq: Every day | ORAL | 3 refills | Status: DC

## 2019-07-15 MED ORDER — OMEPRAZOLE 20 MG PO CPDR: 20.0000 mg | DELAYED_RELEASE_CAPSULE | Freq: Two times a day (BID) | ORAL | 0 refills | Status: DC

## 2019-07-15 MED ORDER — AMLODIPINE BESYLATE 10 MG PO TABS: 10.0000 mg | ORAL_TABLET | Freq: Every day | ORAL | 1 refills | Status: DC

## 2019-07-15 MED ORDER — LEVOTHYROXINE SODIUM 100 MCG PO TABS: ORAL_TABLET | ORAL | 1 refills | Status: DC

## 2019-07-15 MED ORDER — SERTRALINE HCL 100 MG PO TABS: 200.0000 mg | ORAL_TABLET | Freq: Every day | ORAL | 3 refills | Status: DC

## 2019-07-15 MED ORDER — GABAPENTIN 100 MG PO CAPS: 100.0000 mg | ORAL_CAPSULE | Freq: Four times a day (QID) | ORAL | 1 refills | Status: DC | PRN

## 2019-07-15 NOTE — Telephone Encounter (Signed)
Called pt - aware of referral placed today

## 2019-07-15 NOTE — Progress Notes (Signed)
Virtual Visit via telephone Note Due to COVID-19 pandemic this visit was conducted virtually. This visit type was conducted due to national recommendations for restrictions regarding the COVID-19 Pandemic (e.g. social distancing, sheltering in place) in an effort to limit this patient's exposure and mitigate transmission in our community. All issues noted in this document were discussed and addressed.  A physical exam was not performed with this format.   I connected with [name] on 07/15/19 at 0750 by telephone and verified that I am speaking with the correct person using two identifiers. Danielle Parrish is currently located at home and no one is currently with them during visit. The provider, Monia Pouch, FNP is located in their office at time of visit.  I discussed the limitations, risks, security and privacy concerns of performing an evaluation and management service by telephone and the availability of in person appointments. I also discussed with the patient that there may be a patient responsible charge related to this service. The patient expressed understanding and agreed to proceed.  Subjective:  Patient ID: Danielle Parrish, female    DOB: May 23, 1941, 78 y.o.   MRN: [id number]  Chief Complaint:  Medical Management of Chronic Issues   HPI: Danielle Parrish is a 78 y.o. female presenting on 07/15/2019 for Medical Management of Chronic Issues   1. Hypertension associated with type 2 diabetes mellitus ([place]) Complaint with meds - Yes Checking BP at home - No Exercising Regularly - No Watching Salt intake - No Pertinent ROS:  Headache - No Chest pain - No Dyspnea - No Palpitations - No LE edema - Yes They report good compliance with medications and can restate their regimen by memory. No medication side effects.  BP Readings from Last 3 Encounters:  06/18/19 (!) 143/90  03/13/19 139/70  01/30/19 (!) 168/70     2. Chronic diastolic heart failure ([place])  Pt states she has increased fatigue. No significant weight gain, shortness of breath, chest pain, palpitations, syncope, or leg swelling. She does have minimal leg swelling, states no more than normal.   3. Gastroesophageal reflux disease without esophagitis Doing well on current medications. No hemoptysis, cough, voice change, sore throat, dysphagia, melena, or hematochezia.   4. OSA (obstructive sleep apnea) Increased daytime fatigue and lack of energy. Does wear a CPAP at night. Has not had settings adjusted in a long time per pt. Epworth Sleepiness Scale score of 17.   5. Hyperlipidemia associated with type 2 diabetes mellitus ([place]) Does not watch diet or exercise on a regular basis. Compliant with medications without associated side effects.   6. Type 2 diabetes mellitus with hypercholesterolemia ([place]) Diet controlled. Does not check blood sugars at home. Does not diet or exercise.   7. Acquired hypothyroidism Increased daytime fatigue and lack of energy. No skin, hair, or nail changes. No changes in bowel habits.   8. Chronic pain of multiple joints Ongoing for several months. Has not followed up with pain clinic. Takes gabapentin with some control of pain.   9. Depression, recurrent ([place]) Doing fairly well on current medications. States she still has some down days. No HI or SI.  Depression screen [facility] 2/9 07/15/2019 06/16/2019 03/13/2019 01/30/2019 01/01/2019  Decreased Interest 1 2 1 1 1   Down, Depressed, Hopeless 1 1 2 1 1   PHQ - 2 Score 2 3 3 2 2   Altered sleeping 1 2 0 1 0  Tired, decreased energy 3 3 3 1 1   Change in appetite  0 0 1 1 2   Feeling bad or failure about yourself  0 0 0 1 1  Trouble concentrating 1 0 1 1 1   Moving slowly or fidgety/restless 0 1 1 1  0  Suicidal thoughts 0 0 0 1 1  PHQ-9 Score 7 9 9 9 8   Difficult doing work/chores - - - Not difficult at all Somewhat difficult  Some recent data might be hidden     Relevant past medical, surgical, family, and  social history reviewed and updated as indicated.  Allergies and medications reviewed and updated.   Past Medical History:  Diagnosis Date  . Anxiety   . Arthritis   . CHF (congestive heart failure) ([place])   . Chronic pain of multiple joints   . Depression   . Essential hypertension   . GERD (gastroesophageal reflux disease)   . Hyperlipidemia   . Hyperlipidemia associated with type 2 diabetes mellitus ([place])   . Hypertension associated with type 2 diabetes mellitus ([place])   . Hypothyroidism   . Osteoporosis   . Sleep apnea    CPAP  . Type 2 diabetes mellitus with hypercholesterolemia ([place])   . Vitamin D deficiency     Past Surgical History:  Procedure Laterality Date  . ABDOMINAL HERNIA REPAIR    . ABDOMINAL HYSTERECTOMY  1977  . BREAST EXCISIONAL BIOPSY Right   . KNEE ARTHROSCOPY Left 09/30/2014   Procedure: LEFT KNEE ARTHROSCOPY MEDIAL MENISECTOMY ABRASION CHONDROPLASTY SYNOVECTOMY SUPRAPATELLER CUFF;  Surgeon: Tobi Bastos, MD;  Location: [facility];  Service: Orthopedics;  Laterality: Left;    Social History   Socioeconomic History  . Marital status: Widowed    Spouse name: Laverna Peace  . Number of children: 2  . Years of education: Not on file  . Highest education level: 12th grade  Occupational History  . Occupation: CNA    Comment: [employer]  . Financial resource strain: Very hard  . Food insecurity    Worry: Sometimes true    Inability: Sometimes true  . Transportation needs    Medical: No    Non-medical: No  Tobacco Use  . Smoking status: Never Smoker  . Smokeless tobacco: Never Used  Substance and Sexual Activity  . Alcohol use: No  . Drug use: No  . Sexual activity: Not Currently  Lifestyle  . Physical activity    Days per week: 0 days    Minutes per session: 0 min  . Stress: Not at all  Relationships  . Social connections    Talks on phone: More than three times a week    Gets together: More than three times a week    Attends  religious service: Never    Active member of club or organization: No    Attends meetings of clubs or organizations: Never    Relationship status: Widowed  . Intimate partner violence    Fear of current or ex partner: No    Emotionally abused: No    Physically abused: No    Forced sexual activity: No  Other Topics Concern  . Not on file  Social History Narrative  . Not on file    Outpatient Encounter Medications as of 07/15/2019  Medication Sig  . albuterol (VENTOLIN HFA) 108 (90 Base) MCG/ACT inhaler Inhale 2 puffs into the lungs every 6 (six) hours as needed for wheezing or shortness of breath.  Marland Kitchen amLODipine (NORVASC) 10 MG tablet Take 1 tablet (10 mg total) by mouth daily.  Marland Kitchen aspirin  81 MG chewable tablet Chew 4 tablets by mouth daily.   . Biotin 10 MG CAPS Take 1 capsule by mouth daily.   . calcium-vitamin D (OSCAL WITH D) 500-200 MG-UNIT TABS tablet TAKE 1 TABLET BY MOUTH EVERY DAY WITH BREAKFAST  . Cholecalciferol (VITAMIN D-3 PO) Take 1 capsule by mouth daily.   Marland Kitchen denosumab (PROLIA) 60 MG/ML SOSY injection Bring to your doctor to inject 60 mg subcutaneously.  . diclofenac sodium (VOLTAREN) 1 % GEL Apply 2 g topically 4 (four) times daily.  . diphenhydrAMINE (BENADRYL) 25 MG tablet Take 50 mg by mouth at bedtime as needed for allergies.   Marland Kitchen gabapentin (NEURONTIN) 100 MG capsule Take 1 capsule (100 mg total) by mouth 4 (four) times daily as needed.  Marland Kitchen ketoconazole (NIZORAL) 2 % cream APPLY TO AFFECTED AREA EVERY DAY  . levothyroxine (SYNTHROID) 100 MCG tablet Take 1 Tablet by mouth once daily BEFORE BREAKFAST  . lisinopril (ZESTRIL) 10 MG tablet Take 1 tablet (10 mg total) by mouth daily.  Marland Kitchen lovastatin (MEVACOR) 40 MG tablet Take 1 tablet (40 mg total) by mouth at bedtime.  . Multiple Vitamin (MULTI-VITAMIN) tablet Take by mouth.  . Multiple Vitamins-Minerals (MULTIVITAMIN ADULT) CHEW Chew 2 tablets by mouth daily.  . mupirocin ointment (BACTROBAN) 2 % Apply 1 application  topically 2 (two) times daily.  Marland Kitchen nystatin (MYCOSTATIN/NYSTOP) powder Apply topically 4 (four) times daily.  Marland Kitchen nystatin cream (MYCOSTATIN) Apply 1 application topically 2 (two) times daily.  Marland Kitchen omeprazole (PRILOSEC) 20 MG capsule Take 1 capsule (20 mg total) by mouth 2 (two) times daily before a meal.  . pyridOXINE (VITAMIN B-6) 100 MG tablet Take 100 mg by mouth daily.  . sertraline (ZOLOFT) 100 MG tablet Take 2 tablets (200 mg total) by mouth daily.  . sodium chloride (OCEAN) 0.65 % SOLN nasal spray Place 1 spray into both nostrils as needed for congestion.  . [DISCONTINUED] amLODipine (NORVASC) 10 MG tablet Take 1 tablet (10 mg total) by mouth daily.  . [DISCONTINUED] gabapentin (NEURONTIN) 100 MG capsule TAKE 1 CAPSULE (100 MG TOTAL) BY MOUTH 4 (FOUR) TIMES DAILY AS NEEDED.  . [DISCONTINUED] levothyroxine (SYNTHROID, LEVOTHROID) 100 MCG tablet Take 1 Tablet by mouth once daily BEFORE BREAKFAST  . [DISCONTINUED] lisinopril (PRINIVIL,ZESTRIL) 10 MG tablet Take 1 tablet (10 mg total) by mouth daily.  . [DISCONTINUED] lovastatin (MEVACOR) 40 MG tablet Take 1 tablet (40 mg total) by mouth at bedtime.  . [DISCONTINUED] omeprazole (PRILOSEC) 20 MG capsule Take 1 capsule (20 mg total) by mouth 2 (two) times daily before a meal.  . [DISCONTINUED] sertraline (ZOLOFT) 100 MG tablet Take 2 tablets (200 mg total) by mouth daily for 30 days.   No facility-administered encounter medications on file as of 07/15/2019.     Allergies  Allergen Reactions  . Crestor [Rosuvastatin] Other (See Comments)    Body aches.   . Nsaids     Burns stomach.   . Penicillins Hives    Has patient had a PCN reaction causing immediate rash, facial/tongue/throat swelling, SOB or lightheadedness with hypotension: Yes Has patient had a PCN reaction causing severe rash involving mucus membranes or skin necrosis: unknown Has patient had a PCN reaction that required hospitalization No Has patient had a PCN reaction occurring  within the last 10 years: No If all of the above answers are "NO", then may proceed with Cephalosporin use.   . Vytorin [Ezetimibe-Simvastatin]     Body aches.   . Tolmetin Nausea Only  Burns stomach.     Review of Systems  Constitutional: Positive for fatigue. Negative for activity change, appetite change, chills, diaphoresis, fever and unexpected weight change.  HENT: Negative for congestion.   Eyes: Negative for photophobia and visual disturbance.  Respiratory: Negative for cough, choking, chest tightness, shortness of breath and wheezing.   Cardiovascular: Positive for leg swelling. Negative for chest pain and palpitations.  Gastrointestinal: Negative for abdominal distention, abdominal pain, anal bleeding, blood in stool, constipation, diarrhea and nausea.  Genitourinary: Negative for decreased urine volume and difficulty urinating.  Musculoskeletal: Positive for arthralgias (chronic). Negative for myalgias.  Skin: Negative for color change and pallor.  Neurological: Positive for weakness (generalized). Negative for dizziness, tremors, seizures, syncope, facial asymmetry, speech difficulty, light-headedness, numbness and headaches.  Psychiatric/Behavioral: Positive for decreased concentration, dysphoric mood and sleep disturbance. Negative for agitation, behavioral problems, confusion, hallucinations, self-injury and suicidal ideas. The patient is not nervous/anxious and is not hyperactive.   All other systems reviewed and are negative.        Observations/Objective: No vital signs or physical exam, this was a telephone or virtual health encounter.  Pt alert and oriented, answers all questions appropriately, and able to speak in full sentences.    Assessment and Plan: Hiromi was seen today for medical management of chronic issues.  Diagnoses and all orders for this visit:  Hypertension associated with type 2 diabetes mellitus ([place]) Diet and exercise discussed. Follow up in  1 month for labs. Continue below.  -     amLODipine (NORVASC) 10 MG tablet; Take 1 tablet (10 mg total) by mouth daily. -     lisinopril (ZESTRIL) 10 MG tablet; Take 1 tablet (10 mg total) by mouth daily.  Chronic diastolic heart failure ([place]) Followed by cardiology. No indications of acute exacerbation.   Gastroesophageal reflux disease without esophagitis Stable. Continue below.  -     omeprazole (PRILOSEC) 20 MG capsule; Take 1 capsule (20 mg total) by mouth 2 (two) times daily before a meal.  OSA (obstructive sleep apnea) Ongoing daytime fatigue. Will refer to neurology for sleep study.  -     Ambulatory referral to Neurology  Hyperlipidemia associated with type 2 diabetes mellitus ([place]) Diet and exercise encouraged. Continue medications as prescribed.  -     lovastatin (MEVACOR) 40 MG tablet; Take 1 tablet (40 mg total) by mouth at bedtime.  Type 2 diabetes mellitus with hypercholesterolemia ([facility]) Diet controlled. Will check A1C at next appointment.   Acquired hypothyroidism Medications as prescribed. Will check TSH in 1 month.  -     levothyroxine (SYNTHROID) 100 MCG tablet; Take 1 Tablet by mouth once daily BEFORE BREAKFAST  Chronic pain of multiple joints Has not followed up with pain clinic. Wants to continue gabapentin.  -     gabapentin (NEURONTIN) 100 MG capsule; Take 1 capsule (100 mg total) by mouth 4 (four) times daily as needed.  Depression, recurrent ([place]) Continue medications as prescribed. Follow up in 1 month.  -     sertraline (ZOLOFT) 100 MG tablet; Take 2 tablets (200 mg total) by mouth daily.     Follow Up Instructions: Return in 1 month (on 08/15/2019), or if symptoms worsen or fail to improve, for HTN, DM, Lipids, Depression.    I discussed the assessment and treatment plan with the patient. The patient was provided an opportunity to ask questions and all were answered. The patient agreed with the plan and demonstrated an understanding of the  instructions.  The patient was advised to call back or seek an in-person evaluation if the symptoms worsen or if the condition fails to improve as anticipated.  The above assessment and management plan was discussed with the patient. The patient verbalized understanding of and has agreed to the management plan. Patient is aware to call the clinic if symptoms persist or worsen. Patient is aware when to return to the clinic for a follow-up visit. Patient educated on when it is appropriate to go to the emergency department.    I provided 25 minutes of non-face-to-face time during this encounter. The call started at 0750. The call ended at 0815. The other time was used for coordination of care.    Monia Pouch, FNP-C [facility] [address] [place], [place] [address] [phone] 07/15/19

## 2019-07-17 ENCOUNTER — Other Ambulatory Visit: Payer: Medicare Other

## 2019-07-17 ENCOUNTER — Other Ambulatory Visit: Payer: Self-pay

## 2019-07-17 DIAGNOSIS — E1169 Type 2 diabetes mellitus with other specified complication: Secondary | ICD-10-CM

## 2019-07-17 DIAGNOSIS — E1159 Type 2 diabetes mellitus with other circulatory complications: Secondary | ICD-10-CM

## 2019-07-17 DIAGNOSIS — G8929 Other chronic pain: Secondary | ICD-10-CM

## 2019-07-17 DIAGNOSIS — E039 Hypothyroidism, unspecified: Secondary | ICD-10-CM

## 2019-07-17 DIAGNOSIS — E7800 Pure hypercholesterolemia, unspecified: Secondary | ICD-10-CM

## 2019-07-17 DIAGNOSIS — F339 Major depressive disorder, recurrent, unspecified: Secondary | ICD-10-CM

## 2019-07-17 DIAGNOSIS — G4733 Obstructive sleep apnea (adult) (pediatric): Secondary | ICD-10-CM

## 2019-07-17 DIAGNOSIS — K219 Gastro-esophageal reflux disease without esophagitis: Secondary | ICD-10-CM

## 2019-07-17 DIAGNOSIS — I5032 Chronic diastolic (congestive) heart failure: Secondary | ICD-10-CM

## 2019-07-17 DIAGNOSIS — M2550 Pain in unspecified joint: Secondary | ICD-10-CM

## 2019-07-17 NOTE — Addendum Note (Signed)
Addended by: Zannie Cove on: 07/17/2019 11:53 AM   Modules accepted: Orders

## 2019-07-18 LAB — CBC WITH DIFFERENTIAL/PLATELET
Basophils Absolute: 0 10*3/uL (ref 0.0–0.2)
Basos: 0 %
EOS (ABSOLUTE): 0.1 10*3/uL (ref 0.0–0.4)
Eos: 1 %
Hematocrit: 41.2 % (ref 34.0–46.6)
Hemoglobin: 13.9 g/dL (ref 11.1–15.9)
Immature Grans (Abs): 0 10*3/uL (ref 0.0–0.1)
Immature Granulocytes: 0 %
Lymphocytes Absolute: 1.6 10*3/uL (ref 0.7–3.1)
Lymphs: 32 %
MCH: 30.5 pg (ref 26.6–33.0)
MCHC: 33.7 g/dL (ref 31.5–35.7)
MCV: 90 fL (ref 79–97)
Monocytes Absolute: 0.5 10*3/uL (ref 0.1–0.9)
Monocytes: 10 %
Neutrophils Absolute: 2.9 10*3/uL (ref 1.4–7.0)
Neutrophils: 57 %
Platelets: 211 10*3/uL (ref 150–450)
RBC: 4.56 x10E6/uL (ref 3.77–5.28)
RDW: 12.9 % (ref 11.7–15.4)
WBC: 5.1 10*3/uL (ref 3.4–10.8)

## 2019-07-18 LAB — CMP14+EGFR
ALT: 13 IU/L (ref 0–32)
AST: 16 IU/L (ref 0–40)
Albumin/Globulin Ratio: 1.5 (ref 1.2–2.2)
Albumin: 4 g/dL (ref 3.7–4.7)
Alkaline Phosphatase: 64 IU/L (ref 39–117)
BUN/Creatinine Ratio: 21 (ref 12–28)
BUN: 13 mg/dL (ref 8–27)
Bilirubin Total: 0.4 mg/dL (ref 0.0–1.2)
CO2: 23 mmol/L (ref 20–29)
Calcium: 9.2 mg/dL (ref 8.7–10.3)
Chloride: 102 mmol/L (ref 96–106)
Creatinine, Ser: 0.62 mg/dL (ref 0.57–1.00)
GFR calc Af Amer: 100 mL/min/{1.73_m2} (ref >59)
GFR calc non Af Amer: 87 mL/min/{1.73_m2} (ref >59)
Globulin, Total: 2.7 g/dL (ref 1.5–4.5)
Glucose: 105 mg/dL — ABNORMAL HIGH (ref 65–99)
Potassium: 4.5 mmol/L (ref 3.5–5.2)
Sodium: 142 mmol/L (ref 134–144)
Total Protein: 6.7 g/dL (ref 6.0–8.5)

## 2019-07-18 LAB — VITAMIN B12: Vitamin B-12: 1161 pg/mL (ref 232–1245)

## 2019-07-18 LAB — THYROID PANEL WITH TSH
Free Thyroxine Index: 2.4 (ref 1.2–4.9)
T3 Uptake Ratio: 27 % (ref 24–39)
T4, Total: 8.8 ug/dL (ref 4.5–12.0)
TSH: 1.78 u[IU]/mL (ref 0.450–4.500)

## 2019-07-18 LAB — VITAMIN D 25 HYDROXY (VIT D DEFICIENCY, FRACTURES): Vit D, 25-Hydroxy: 48.8 ng/mL (ref 30.0–100.0)

## 2019-07-28 ENCOUNTER — Ambulatory Visit (INDEPENDENT_AMBULATORY_CARE_PROVIDER_SITE_OTHER): Payer: Medicare Other

## 2019-07-28 DIAGNOSIS — Z0000 Encounter for general adult medical examination without abnormal findings: Secondary | ICD-10-CM

## 2019-07-28 NOTE — Progress Notes (Signed)
MEDICARE ANNUAL WELLNESS VISIT  07/28/2019  Telephone Visit Disclaimer This Medicare AWV was conducted by telephone due to national recommendations for restrictions regarding the COVID-19 Pandemic (e.g. social distancing).  I verified, using two identifiers, that I am speaking with Danielle Parrish or their authorized healthcare agent. I discussed the limitations, risks, security, and privacy concerns of performing an evaluation and management service by telephone and the potential availability of an in-person appointment in the future. The patient expressed understanding and agreed to proceed.   Subjective:  Danielle Parrish is a 78 y.o. female patient of Rakes, Connye Burkitt, FNP who had a Medicare Annual Wellness Visit today via telephone. Danielle Parrish is Retired and lives alone. she has 2 children. she reports that she is socially active and does interact with friends/family regularly. she is minimally physically active..  Patient Care Team: Rakes, Connye Burkitt, FNP as PCP - General (Family Medicine) Ilean China, RN as Case Manager Sandford Craze, MD as Referring Physician (Dermatology)  Advanced Directives 07/28/2019 09/29/2018 07/23/2018 06/25/2017 07/31/2016 11/06/2015 09/30/2014  Does Patient Have a Medical Advance Directive? No No No No No No No  Would patient like information on creating a medical advance directive? No - Patient declined - No - Patient declined No - Patient declined No - patient declined information No - patient declined information No - patient declined information    Hospital Utilization Over the Past 12 Months: # of hospitalizations or ER visits: 0 # of surgeries: 0  Review of Systems    Patient reports that her overall health is worse compared to last year.  Patient Reported Readings (BP, Pulse, CBG, Weight, etc) none  Review of Systems: History obtained from chart review  All other systems negative.  Pain Assessment Pain : 0-10 Pain Score: 6  Pain Type:  Chronic pain Pain Location: Knee Pain Onset: More than a month ago Pain Frequency: Intermittent Pain Relieving Factors: Takes Tylenol Effect of Pain on Daily Activities: Slows patient down some days  Pain Relieving Factors: Takes Tylenol  Current Medications & Allergies (verified) Allergies as of 07/28/2019      Reactions   Crestor [rosuvastatin] Other (See Comments)   Body aches.    Nsaids    Burns stomach.    Penicillins Hives   Has patient had a PCN reaction causing immediate rash, facial/tongue/throat swelling, SOB or lightheadedness with hypotension: Yes Has patient had a PCN reaction causing severe rash involving mucus membranes or skin necrosis: unknown Has patient had a PCN reaction that required hospitalization No Has patient had a PCN reaction occurring within the last 10 years: No If all of the above answers are "NO", then may proceed with Cephalosporin use.   Vytorin [ezetimibe-simvastatin]    Body aches.    Tolmetin Nausea Only   Burns stomach.       Medication List       Accurate as of July 28, 2019  9:03 AM. If you have any questions, ask your nurse or doctor.        STOP taking these medications   Multivitamin Adult Chew     TAKE these medications   albuterol 108 (90 Base) MCG/ACT inhaler Commonly known as: VENTOLIN HFA Inhale 2 puffs into the lungs every 6 (six) hours as needed for wheezing or shortness of breath.   amLODipine 10 MG tablet Commonly known as: NORVASC Take 1 tablet (10 mg total) by mouth daily.   aspirin 81 MG chewable tablet Chew 4 tablets by  mouth daily.   Biotin 10 MG Caps Take 1 capsule by mouth daily.   calcium-vitamin D 500-200 MG-UNIT Tabs tablet Commonly known as: OSCAL WITH D TAKE 1 TABLET BY MOUTH EVERY DAY WITH BREAKFAST   denosumab 60 MG/ML Sosy injection Commonly known as: PROLIA Bring to your doctor to inject 60 mg subcutaneously.   diclofenac sodium 1 % Gel Commonly known as: Voltaren Apply 2 g topically 4  (four) times daily.   diphenhydrAMINE 25 MG tablet Commonly known as: BENADRYL Take 50 mg by mouth at bedtime as needed for allergies.   gabapentin 100 MG capsule Commonly known as: NEURONTIN Take 1 capsule (100 mg total) by mouth 4 (four) times daily as needed.   ketoconazole 2 % cream Commonly known as: NIZORAL APPLY TO AFFECTED AREA EVERY DAY   levothyroxine 100 MCG tablet Commonly known as: SYNTHROID Take 1 Tablet by mouth once daily BEFORE BREAKFAST   lisinopril 10 MG tablet Commonly known as: ZESTRIL Take 1 tablet (10 mg total) by mouth daily.   lovastatin 40 MG tablet Commonly known as: MEVACOR Take 1 tablet (40 mg total) by mouth at bedtime.   Multi-Vitamin tablet Take by mouth.   mupirocin ointment 2 % Commonly known as: Bactroban Apply 1 application topically 2 (two) times daily.   nystatin powder Commonly known as: MYCOSTATIN/NYSTOP Apply topically 4 (four) times daily.   nystatin cream Commonly known as: MYCOSTATIN Apply 1 application topically 2 (two) times daily.   omeprazole 20 MG capsule Commonly known as: PRILOSEC Take 1 capsule (20 mg total) by mouth 2 (two) times daily before a meal.   pyridOXINE 100 MG tablet Commonly known as: VITAMIN B-6 Take 100 mg by mouth daily.   sertraline 100 MG tablet Commonly known as: ZOLOFT Take 2 tablets (200 mg total) by mouth daily.   sodium chloride 0.65 % Soln nasal spray Commonly known as: OCEAN Place 1 spray into both nostrils as needed for congestion.   VITAMIN D-3 PO Take 1 capsule by mouth daily.       History (reviewed): Past Medical History:  Diagnosis Date   Anxiety    Arthritis    CHF (congestive heart failure) (HCC)    Chronic pain of multiple joints    Depression    Essential hypertension    GERD (gastroesophageal reflux disease)    Hyperlipidemia    Hyperlipidemia associated with type 2 diabetes mellitus ([place])    Hypertension associated with type 2 diabetes mellitus  ([place])    Hypothyroidism    Osteoporosis    Sleep apnea    CPAP   Type 2 diabetes mellitus with hypercholesterolemia ([place])    Vitamin D deficiency    Past Surgical History:  Procedure Laterality Date   ABDOMINAL HERNIA REPAIR     ABDOMINAL HYSTERECTOMY  1977   BREAST EXCISIONAL BIOPSY Right    KNEE ARTHROSCOPY Left 09/30/2014   Procedure: LEFT KNEE ARTHROSCOPY MEDIAL MENISECTOMY ABRASION CHONDROPLASTY SYNOVECTOMY SUPRAPATELLER CUFF;  Surgeon: Tobi Bastos, MD;  Location: [facility];  Service: Orthopedics;  Laterality: Left;   Family History  Problem Relation Age of Onset   Hypertension Mother    Stroke Mother    Cancer Father    Social History   Socioeconomic History   Marital status: Widowed    Spouse name: Laverna Peace   Number of children: 2   Years of education: Not on file   Highest education level: 12th grade  Occupational History   Occupation: CNA    Comment: [employer]ist, product/process development strain: Somewhat hard   Food insecurity    Worry: Never true    Inability: Never true   Transportation needs    Medical: No    Non-medical: No  Tobacco Use   Smoking status: Never Smoker   Smokeless tobacco: Never Used  Substance and Sexual Activity   Alcohol use: No   Drug use: No   Sexual activity: Not Currently  Lifestyle   Physical activity    Days per week: 0 days    Minutes per session: 0 min   Stress: Only a little  Relationships   Social connections    Talks on phone: More than three times a week    Gets together: More than three times a week    Attends religious service: Never    Active member of club or organization: No    Attends meetings of clubs or organizations: Never    Relationship status: Widowed  Other Topics Concern   Not on file  Social History Narrative   Not on file    Activities of Daily Living In your present state of health, do you have any difficulty performing the following activities:  07/28/2019 01/20/2019  Hearing? Y N  Comment Wears hearing aid -  Vision? Y N  Comment Wears glasses most of the time -  Difficulty concentrating or making decisions? Y N  Comment Problems remembering things -  Walking or climbing stairs? N Y  Comment - Some due to chronic pain and body habitus  Dressing or bathing? N N  Doing errands, shopping? N N  Preparing Food and eating ? N N  Using the Toilet? N N  In the past six months, have you accidently leaked urine? Y Y  Comment At times -  Do you have problems with loss of bowel control? N N  Managing your Medications? N N  Managing your Finances? N N  Housekeeping or managing your Housekeeping? N N  Some recent data might be hidden    Patient Education/ Literacy How often do you need to have someone help you when you read instructions, pamphlets, or other written materials from your doctor or pharmacy?: 1 - Never What is the last grade level you completed in school?: 12th grade  Exercise Current Exercise Habits: The patient does not participate in regular exercise at present, Exercise limited by: cardiac condition(s)  Diet Patient reports consuming 3 meals a day and 2 snack(s) a day Patient reports that her primary diet is: Regular Patient reports that she does have regular access to food.   Depression Screen PHQ 2/9 Scores 07/15/2019 06/16/2019 03/13/2019 01/30/2019 01/01/2019 11/19/2018 11/08/2018  PHQ - 2 Score 2 3 3 2 2 1 2   PHQ- 9 Score 7 9 9 9 8  - 3     Fall Risk Fall Risk  01/20/2019 01/01/2019 11/19/2018 11/08/2018 10/01/2018  Falls in the past year? 1 1 1 1  Yes  Number falls in past yr: 1 0 1 1 2  or more  Injury with Fall? 1 1 1 1  Yes  Comment - - - - arm and finger  Risk Factor Category  - - - - -  Risk for fall due to : History of fall(s);Medication side effect;Impaired balance/gait - - - -  Risk for fall due to: Comment - - - - -  Follow up Falls prevention discussed - - - -     Objective:  Danielle Parrish seemed  alert and  oriented and she participated appropriately during our telephone visit.  Blood Pressure Weight BMI  BP Readings from Last 3 Encounters:  06/18/19 (!) 143/90  03/13/19 139/70  01/30/19 (!) 168/70   Wt Readings from Last 3 Encounters:  06/18/19 187 lb 14.4 oz (85.2 kg)  03/13/19 184 lb (83.5 kg)  01/30/19 179 lb (81.2 kg)   BMI Readings from Last 1 Encounters:  06/18/19 33.28 kg/m    *Unable to obtain current vital signs, weight, and BMI due to telephone visit type  Hearing/Vision   Ressie did not seem to have difficulty with hearing/understanding during the telephone conversation  Reports that she has not had a formal eye exam by an eye care professional within the past year  Reports that she has not had a formal hearing evaluation within the past year *Unable to fully assess hearing and vision during telephone visit type  Cognitive Function: 6CIT Screen 07/28/2019  What Year? 0 points  What month? 0 points  What time? 0 points  Count back from 20 2 points  Months in reverse 0 points  Repeat phrase 0 points  Total Score 2   (Normal:0-7, Significant for Dysfunction: >8)  Normal Cognitive Function Screening: Yes   Immunization & Health Maintenance Record Immunization History  Administered Date(s) Administered   Influenza, High Dose Seasonal PF 11/14/2017, 10/01/2018   Influenza,inj,Quad PF,6+ Mos 10/15/2013, 11/17/2014, 09/23/2015, 09/14/2016   Pneumococcal Conjugate-13 04/08/2015   Pneumococcal-Unspecified 12/26/2013   Tdap 10/15/2013   Zoster 12/26/2013    Health Maintenance  Topic Date Due   OPHTHALMOLOGY EXAM  02/09/2015   INFLUENZA VACCINE  07/26/2019   HEMOGLOBIN A1C  09/13/2019   FOOT EXAM  03/12/2020   TETANUS/TDAP  10/16/2023   DEXA SCAN  Completed   PNA vac Low Risk Adult  Completed       Assessment  This is a routine wellness examination for [employer] Parrish.  Health Maintenance: Due or Overdue Health Maintenance Due    Topic Date Due   OPHTHALMOLOGY EXAM  02/09/2015   INFLUENZA VACCINE  07/26/2019    Danielle Parrish does not need a referral for Community Assistance: Care Management:   no Social Work:    no Prescription Assistance:  no Nutrition/Diabetes Education:  no   Plan:  Personalized Goals Goals Addressed   None    Personalized Health Maintenance & Screening Recommendations  Patient due for eye exam and would like the shingrix vaccine  Lung Cancer Screening Recommended: no (Low Dose CT Chest recommended if Age 46-80 years, 30 pack-year currently smoking OR have quit w/in past 15 years) Hepatitis C Screening recommended: not applicable HIV Screening recommended: no  Advanced Directives: Written information was not prepared per patient's request.  Referrals & Orders No orders of the defined types were placed in this encounter.   Follow-up Plan  Follow-up with Baruch Gouty, FNP as planned  Schedule for an eye exam and come to office for Shingrix vaccine    I have personally reviewed and noted the following in the patients chart:    Medical and social history  Use of alcohol, tobacco or illicit drugs   Current medications and supplements  Functional ability and status  Nutritional status  Physical activity  Advanced directives  List of other physicians  Hospitalizations, surgeries, and ER visits in previous 12 months  Vitals  Screenings to include cognitive, depression, and falls  Referrals and appointments  In addition, I have reviewed and discussed with Danielle Parrish certain preventive  protocols, quality metrics, and best practice recommendations. A written personalized care plan for preventive services as well as general preventive health recommendations is available and can be mailed to the patient at her request.      Rolena Infante LPN 12/31/6158

## 2019-08-06 ENCOUNTER — Other Ambulatory Visit: Payer: Self-pay

## 2019-08-06 ENCOUNTER — Ambulatory Visit (INDEPENDENT_AMBULATORY_CARE_PROVIDER_SITE_OTHER): Payer: Medicare Other | Admitting: *Deleted

## 2019-08-06 DIAGNOSIS — Z23 Encounter for immunization: Secondary | ICD-10-CM

## 2019-08-06 NOTE — Progress Notes (Signed)
Pt given Shingrix vaccine Tolerated well 

## 2019-08-11 ENCOUNTER — Institutional Professional Consult (permissible substitution): Payer: Medicare Other | Admitting: Neurology

## 2019-08-13 ENCOUNTER — Other Ambulatory Visit: Payer: Self-pay | Admitting: Family Medicine

## 2019-08-13 DIAGNOSIS — R062 Wheezing: Secondary | ICD-10-CM

## 2019-08-15 ENCOUNTER — Ambulatory Visit: Payer: Medicare Other | Admitting: Family Medicine

## 2019-09-07 ENCOUNTER — Other Ambulatory Visit: Payer: Self-pay | Admitting: Family Medicine

## 2019-09-07 DIAGNOSIS — M8000XS Age-related osteoporosis with current pathological fracture, unspecified site, sequela: Secondary | ICD-10-CM

## 2019-09-14 ENCOUNTER — Other Ambulatory Visit: Payer: Self-pay | Admitting: Family Medicine

## 2019-09-14 DIAGNOSIS — B3749 Other urogenital candidiasis: Secondary | ICD-10-CM

## 2019-09-29 ENCOUNTER — Other Ambulatory Visit: Payer: Self-pay

## 2019-09-29 MED ORDER — DENOSUMAB 60 MG/ML ~~LOC~~ SOSY: PREFILLED_SYRINGE | SUBCUTANEOUS | 0 refills | Status: DC

## 2019-09-29 NOTE — Telephone Encounter (Signed)
Reorder Prolia per [employer]

## 2019-10-10 ENCOUNTER — Other Ambulatory Visit: Payer: Self-pay

## 2019-10-10 ENCOUNTER — Encounter: Payer: Self-pay | Admitting: Family Medicine

## 2019-10-10 ENCOUNTER — Ambulatory Visit (INDEPENDENT_AMBULATORY_CARE_PROVIDER_SITE_OTHER): Payer: Medicare Other | Admitting: Family Medicine

## 2019-10-10 DIAGNOSIS — J069 Acute upper respiratory infection, unspecified: Secondary | ICD-10-CM | POA: Diagnosis not present

## 2019-10-10 MED ORDER — HYDROCOD POLST-CPM POLST ER 10-8 MG/5ML PO SUER: 5.0000 mL | Freq: Two times a day (BID) | ORAL | 0 refills | Status: DC | PRN

## 2019-10-10 MED ORDER — PREDNISONE 20 MG PO TABS: ORAL_TABLET | ORAL | 0 refills | Status: DC

## 2019-10-10 MED ORDER — AZITHROMYCIN 250 MG PO TABS: ORAL_TABLET | ORAL | 0 refills | Status: DC

## 2019-10-10 MED ORDER — BENZONATATE 100 MG PO CAPS: 100.0000 mg | ORAL_CAPSULE | Freq: Three times a day (TID) | ORAL | 0 refills | Status: DC | PRN

## 2019-10-10 NOTE — Progress Notes (Signed)
Virtual Visit via telephone Note Due to COVID-19 pandemic this visit was conducted virtually. This visit type was conducted due to national recommendations for restrictions regarding the COVID-19 Pandemic (e.g. social distancing, sheltering in place) in an effort to limit this patient's exposure and mitigate transmission in our community. All issues noted in this document were discussed and addressed.  A physical exam was not performed with this format.   I connected with Danielle Parrish on 10/10/19 at 1400 by telephone and verified that I am speaking with the correct person using two identifiers. Danielle Parrish is currently located at home and no one is currently with them during visit. The provider, Monia Pouch, FNP is located in their office at time of visit.  I discussed the limitations, risks, security and privacy concerns of performing an evaluation and management service by telephone and the availability of in person appointments. I also discussed with the patient that there may be a patient responsible charge related to this service. The patient expressed understanding and agreed to proceed.  Subjective:  Patient ID: Danielle Parrish, female    DOB: 1941/12/25, 78 y.o.   MRN: [id number]  Chief Complaint:  URI   HPI: Danielle Parrish is a 78 y.o. female presenting on 10/10/2019 for URI   Pt reports ongoing cough, congestion, runny nose, and postnasal drip. Pt states this started about a month ago and is not getting better. Pt states she has been taking Tylenol and an allergy pill without relief of symptoms. States her cough is getting worse. She has chills and malaise with the symptoms.   URI  This is a new problem. The current episode started more than 1 month ago. The problem has been gradually worsening. Associated symptoms include congestion, coughing, ear pain, headaches, a plugged ear sensation, rhinorrhea, sinus pain, sneezing, a sore throat and wheezing. Pertinent  negatives include no abdominal pain, chest pain, diarrhea, dysuria, joint pain, joint swelling, nausea, neck pain, rash, swollen glands or vomiting. She has tried acetaminophen and antihistamine for the symptoms. The treatment provided no relief.     Relevant past medical, surgical, family, and social history reviewed and updated as indicated.  Allergies and medications reviewed and updated.   Past Medical History:  Diagnosis Date   Anxiety    Arthritis    CHF (congestive heart failure) (HCC)    Chronic pain of multiple joints    Depression    Essential hypertension    GERD (gastroesophageal reflux disease)    Hyperlipidemia    Hyperlipidemia associated with type 2 diabetes mellitus ([place])    Hypertension associated with type 2 diabetes mellitus ([place])    Hypothyroidism    Osteoporosis    Sleep apnea    CPAP   Type 2 diabetes mellitus with hypercholesterolemia ([place])    Vitamin D deficiency     Past Surgical History:  Procedure Laterality Date   ABDOMINAL HERNIA REPAIR     ABDOMINAL HYSTERECTOMY  1977   BREAST EXCISIONAL BIOPSY Right    KNEE ARTHROSCOPY Left 09/30/2014   Procedure: LEFT KNEE ARTHROSCOPY MEDIAL MENISECTOMY ABRASION CHONDROPLASTY SYNOVECTOMY SUPRAPATELLER CUFF;  Surgeon: Tobi Bastos, MD;  Location: [facility];  Service: Orthopedics;  Laterality: Left;    Social History   Socioeconomic History   Marital status: Widowed    Spouse name: Laverna Peace   Number of children: 2   Years of education: Not on file   Highest education level: 12th grade  Occupational History   Occupation: CNA  Comment: [employer]   Financial resource strain: Somewhat hard   Food insecurity    Worry: Never true    Inability: Never true   Transportation needs    Medical: No    Non-medical: No  Tobacco Use   Smoking status: Never Smoker   Smokeless tobacco: Never Used  Substance and Sexual Activity   Alcohol use: No   Drug use: No    Sexual activity: Not Currently  Lifestyle   Physical activity    Days per week: 0 days    Minutes per session: 0 min   Stress: Only a little  Relationships   Social connections    Talks on phone: More than three times a week    Gets together: More than three times a week    Attends religious service: Never    Active member of club or organization: No    Attends meetings of clubs or organizations: Never    Relationship status: Widowed   Intimate partner violence    Fear of current or ex partner: No    Emotionally abused: No    Physically abused: No    Forced sexual activity: No  Other Topics Concern   Not on file  Social History Narrative   Not on file    Outpatient Encounter Medications as of 10/10/2019  Medication Sig   albuterol (VENTOLIN HFA) 108 (90 Base) MCG/ACT inhaler TAKE 2 PUFFS BY MOUTH EVERY 6 HOURS AS NEEDED FOR WHEEZE OR SHORTNESS OF BREATH   amLODipine (NORVASC) 10 MG tablet Take 1 tablet (10 mg total) by mouth daily.   aspirin 81 MG chewable tablet Chew 4 tablets by mouth daily.    azithromycin (ZITHROMAX Z-PAK) 250 MG tablet As directed   benzonatate (TESSALON PERLES) 100 MG capsule Take 1 capsule (100 mg total) by mouth 3 (three) times daily as needed for cough.   Biotin 10 MG CAPS Take 1 capsule by mouth daily.    calcium carbonate (OS-CAL) 600 MG TABS tablet Take by mouth.   calcium-vitamin D (OSCAL WITH D) 500-200 MG-UNIT TABS tablet TAKE 1 TABLET BY MOUTH EVERY DAY WITH BREAKFAST   chlorpheniramine-HYDROcodone (TUSSIONEX) 10-8 MG/5ML SUER Take 5 mLs by mouth every 12 (twelve) hours as needed for cough (use if tessalon is not beneficial for cough).   Cholecalciferol (VITAMIN D-3 PO) Take 1 capsule by mouth daily.    Cholecalciferol (VITAMIN D3) POWD Take by mouth.   denosumab (PROLIA) 60 MG/ML SOSY injection Bring to your doctor to inject 60 mg subcutaneously.   diclofenac sodium (VOLTAREN) 1 % GEL Apply 2 g topically 4 (four) times daily.    diphenhydrAMINE (BENADRYL) 25 MG tablet Take 50 mg by mouth at bedtime as needed for allergies.    gabapentin (NEURONTIN) 100 MG capsule Take 1 capsule (100 mg total) by mouth 4 (four) times daily as needed.   ketoconazole (NIZORAL) 2 % cream APPLY TO AFFECTED AREA EVERY DAY   levothyroxine (SYNTHROID) 100 MCG tablet Take 1 Tablet by mouth once daily BEFORE BREAKFAST   lisinopril (ZESTRIL) 10 MG tablet Take 1 tablet (10 mg total) by mouth daily.   lovastatin (MEVACOR) 40 MG tablet Take 1 tablet (40 mg total) by mouth at bedtime.   Multiple Vitamin (MULTI-VITAMIN) tablet Take by mouth.   mupirocin ointment (BACTROBAN) 2 % Apply 1 application topically 2 (two) times daily.   nystatin (MYCOSTATIN/NYSTOP) powder Apply topically 4 (four) times daily.   nystatin cream (MYCOSTATIN) Apply 1 application topically 2 (  two) times daily.   omeprazole (PRILOSEC) 20 MG capsule Take 1 capsule (20 mg total) by mouth 2 (two) times daily before a meal.   predniSONE (DELTASONE) 20 MG tablet 2 po at sametime daily for 5 days   pyridOXINE (VITAMIN B-6) 100 MG tablet Take 100 mg by mouth daily.   sertraline (ZOLOFT) 100 MG tablet Take 2 tablets (200 mg total) by mouth daily.   sodium chloride (OCEAN) 0.65 % SOLN nasal spray Place 1 spray into both nostrils as needed for congestion.   [DISCONTINUED] chlorpheniramine-HYDROcodone (TUSSIONEX) 10-8 MG/5ML SUER Take 5 mLs by mouth every 12 (twelve) hours as needed for cough (use if tessalon is not beneficial for cough).   No facility-administered encounter medications on file as of 10/10/2019.     Allergies  Allergen Reactions   Crestor [Rosuvastatin] Other (See Comments)    Body aches.    Nsaids     Burns stomach.    Penicillins Hives    Has patient had a PCN reaction causing immediate rash, facial/tongue/throat swelling, SOB or lightheadedness with hypotension: Yes Has patient had a PCN reaction causing severe rash involving mucus membranes or  skin necrosis: unknown Has patient had a PCN reaction that required hospitalization No Has patient had a PCN reaction occurring within the last 10 years: No If all of the above answers are "NO", then may proceed with Cephalosporin use.    Vytorin [Ezetimibe-Simvastatin]     Body aches.    Tolmetin Nausea Only    Burns stomach.     Review of Systems  Constitutional: Positive for activity change, chills and fatigue. Negative for appetite change, diaphoresis, fever and unexpected weight change.  HENT: Positive for congestion, ear pain, postnasal drip, rhinorrhea, sinus pressure, sinus pain, sneezing and sore throat.   Respiratory: Positive for cough and wheezing. Negative for shortness of breath.   Cardiovascular: Negative for chest pain, palpitations and leg swelling.  Gastrointestinal: Negative for abdominal pain, diarrhea, nausea and vomiting.  Genitourinary: Negative for decreased urine volume, difficulty urinating and dysuria.  Musculoskeletal: Positive for arthralgias and myalgias. Negative for joint pain and neck pain.  Skin: Negative for color change, pallor and rash.  Neurological: Positive for headaches. Negative for dizziness, tremors, seizures, syncope, facial asymmetry, speech difficulty, weakness, light-headedness and numbness.  Psychiatric/Behavioral: Negative for confusion.  All other systems reviewed and are negative.        Observations/Objective: No vital signs or physical exam, this was a telephone or virtual health encounter.  Pt alert and oriented, answers all questions appropriately, and able to speak in full sentences.    Assessment and Plan: Danielle Parrish was seen today for uri.  Diagnoses and all orders for this visit:  URI with cough and congestion Ongoing URI symptoms for over 3 weeks despite symptomatic care. Will initiate antibiotics at this time and burst of steroids for cough and wheezing. Tessalon as needed for cough. Will also send in Tussionex as needed  for cough that is not controlled with tessalon. Pt aware to only take this as needed for cough not relieved by tessalon. Report any new, worsening, or persistent symptoms. Medications as prescribed.  -     benzonatate (TESSALON PERLES) 100 MG capsule; Take 1 capsule (100 mg total) by mouth 3 (three) times daily as needed for cough. -     predniSONE (DELTASONE) 20 MG tablet; 2 po at sametime daily for 5 days -     azithromycin (ZITHROMAX Z-PAK) 250 MG tablet; As directed -  chlorpheniramine-HYDROcodone ([place]) 10-8 MG/5ML SUER; Take 5 mLs by mouth every 12 (twelve) hours as needed for cough (use if tessalon is not beneficial for cough).     Follow Up Instructions: Return if symptoms worsen or fail to improve.    I discussed the assessment and treatment plan with the patient. The patient was provided an opportunity to ask questions and all were answered. The patient agreed with the plan and demonstrated an understanding of the instructions.   The patient was advised to call back or seek an in-person evaluation if the symptoms worsen or if the condition fails to improve as anticipated.  The above assessment and management plan was discussed with the patient. The patient verbalized understanding of and has agreed to the management plan. Patient is aware to call the clinic if they develop any new symptoms or if symptoms persist or worsen. Patient is aware when to return to the clinic for a follow-up visit. Patient educated on when it is appropriate to go to the emergency department.    I provided 15 minutes of non-face-to-face time during this encounter. The call started at 1400. The call ended at 1415. The other time was used for coordination of care.    Monia Pouch, FNP-C [facility] [address] [place], [place] [address] [phone] 10/10/19

## 2019-10-14 ENCOUNTER — Telehealth: Payer: Self-pay | Admitting: Family Medicine

## 2019-10-14 NOTE — Telephone Encounter (Signed)
Prolia will be delivered to our office on 10/16/2019.  Will need to contact patient and schedule appointment for injection when it is delivered.

## 2019-10-17 ENCOUNTER — Emergency Department (HOSPITAL_COMMUNITY)
Admission: EM | Admit: 2019-10-17 | Discharge: 2019-10-18 | Disposition: A | Discharge status: Home / Self Care | Payer: Medicare Other | Attending: Emergency Medicine | Admitting: Emergency Medicine

## 2019-10-17 ENCOUNTER — Encounter (HOSPITAL_COMMUNITY): Payer: Self-pay | Admitting: Emergency Medicine

## 2019-10-17 ENCOUNTER — Other Ambulatory Visit: Payer: Self-pay

## 2019-10-17 ENCOUNTER — Emergency Department (HOSPITAL_COMMUNITY): Payer: Medicare Other

## 2019-10-17 DIAGNOSIS — R350 Frequency of micturition: Secondary | ICD-10-CM | POA: Insufficient documentation

## 2019-10-17 DIAGNOSIS — I5032 Chronic diastolic (congestive) heart failure: Secondary | ICD-10-CM | POA: Insufficient documentation

## 2019-10-17 DIAGNOSIS — I110 Hypertensive heart disease with heart failure: Secondary | ICD-10-CM | POA: Diagnosis not present

## 2019-10-17 DIAGNOSIS — E039 Hypothyroidism, unspecified: Secondary | ICD-10-CM | POA: Diagnosis not present

## 2019-10-17 DIAGNOSIS — E119 Type 2 diabetes mellitus without complications: Secondary | ICD-10-CM | POA: Insufficient documentation

## 2019-10-17 DIAGNOSIS — Z79899 Other long term (current) drug therapy: Secondary | ICD-10-CM | POA: Diagnosis not present

## 2019-10-17 DIAGNOSIS — R531 Weakness: Secondary | ICD-10-CM | POA: Diagnosis not present

## 2019-10-17 DIAGNOSIS — N3090 Cystitis, unspecified without hematuria: Secondary | ICD-10-CM

## 2019-10-17 DIAGNOSIS — R109 Unspecified abdominal pain: Secondary | ICD-10-CM | POA: Diagnosis present

## 2019-10-17 LAB — URINALYSIS, ROUTINE W REFLEX MICROSCOPIC
Bilirubin Urine: NEGATIVE
Glucose, UA: NEGATIVE mg/dL
Hgb urine dipstick: NEGATIVE
Ketones, ur: NEGATIVE mg/dL
Nitrite: NEGATIVE
Protein, ur: NEGATIVE mg/dL
Specific Gravity, Urine: 1.008 (ref 1.005–1.030)
WBC, UA: >50 WBC/hpf — ABNORMAL HIGH (ref 0–5)
pH: 8 (ref 5.0–8.0)

## 2019-10-17 LAB — CBC
HCT: 51 % — ABNORMAL HIGH (ref 36.0–46.0)
Hemoglobin: 15.6 g/dL — ABNORMAL HIGH (ref 12.0–15.0)
MCH: 30.5 pg (ref 26.0–34.0)
MCHC: 30.6 g/dL (ref 30.0–36.0)
MCV: 99.8 fL (ref 80.0–100.0)
Platelets: 276 10*3/uL (ref 150–400)
RBC: 5.11 MIL/uL (ref 3.87–5.11)
RDW: 12.7 % (ref 11.5–15.5)
WBC: 9.6 10*3/uL (ref 4.0–10.5)
nRBC: 0 % (ref 0.0–0.2)

## 2019-10-17 LAB — BASIC METABOLIC PANEL
Anion gap: 11 (ref 5–15)
BUN: 11 mg/dL (ref 8–23)
CO2: 29 mmol/L (ref 22–32)
Calcium: 10 mg/dL (ref 8.9–10.3)
Chloride: 95 mmol/L — ABNORMAL LOW (ref 98–111)
Creatinine, Ser: 0.62 mg/dL (ref 0.44–1.00)
GFR calc Af Amer: >60 mL/min (ref >60)
GFR calc non Af Amer: >60 mL/min (ref >60)
Glucose, Bld: 133 mg/dL — ABNORMAL HIGH (ref 70–99)
Potassium: 3.6 mmol/L (ref 3.5–5.1)
Sodium: 135 mmol/L (ref 135–145)

## 2019-10-17 LAB — LIPASE, BLOOD: Lipase: 40 U/L (ref 11–51)

## 2019-10-17 NOTE — ED Provider Notes (Signed)
[facility] EMERGENCY DEPARTMENT Provider Note   CSN: [id number] Arrival date & time: 10/17/19  1608     History   Chief Complaint Chief Complaint  Patient presents with  . Weakness  . Flank Pain    HPI Danielle Parrish is a 78 y.o. female.     Patient complains of right flank pain.  Patient also having urinary frequency.  The history is provided by the patient. No language interpreter was used.  Flank Pain This is a new problem. The current episode started 1 to 2 hours ago. The problem occurs constantly. The problem has not changed since onset.Pertinent negatives include no chest pain, no abdominal pain and no headaches. Nothing aggravates the symptoms. Nothing relieves the symptoms. She has tried nothing for the symptoms. The treatment provided no relief.    Past Medical History:  Diagnosis Date  . Anxiety   . Arthritis   . CHF (congestive heart failure) ([place])   . Chronic pain of multiple joints   . Depression   . Essential hypertension   . GERD (gastroesophageal reflux disease)   . Hyperlipidemia   . Hyperlipidemia associated with type 2 diabetes mellitus ([place])   . Hypertension associated with type 2 diabetes mellitus ([place])   . Hypothyroidism   . Osteoporosis   . Sleep apnea    CPAP  . Type 2 diabetes mellitus with hypercholesterolemia ([place])   . Vitamin D deficiency     Patient Active Problem List   Diagnosis Date Noted  . Age-related osteoporosis with current pathological fracture 03/13/2019  . Chronic midline low back pain without sciatica 01/30/2019  . Age-related osteoporosis with current pathol fracture of vertebra ([place]) 10/28/2018  . Calcification of aorta (HCC) 10/02/2018  . Compression fracture of T12 vertebra ([place]) 10/01/2018  . Primary osteoarthritis of right knee 07/20/2017  . Meniscus degeneration, right 06/14/2017  . Varicose veins of left lower extremity with complications [id number]  . Pain medication agreement signed 06/06/2016  . Gout  attack 01/11/2016  . OSA (obstructive sleep apnea) 09/14/2015  . Chronic diastolic heart failure ([place]) 09/10/2015  . Orthopnea 09/02/2015  . Dysphagia, pharyngoesophageal phase 08/04/2015  . Anorexia 08/04/2015  . Fatigue 08/04/2015  . Malaise and fatigue 08/04/2015  . Depression, recurrent ([place]) 04/08/2015  . Osteoarthritis of left knee 09/30/2014  . Vitamin D deficiency   . Anxiety   . GERD (gastroesophageal reflux disease)   . Arthritis   . Chronic pain of multiple joints   . Hypertension associated with type 2 diabetes mellitus ([place]) 05/15/2013  . Hyperlipidemia associated with type 2 diabetes mellitus ([place]) 05/15/2013  . Type 2 diabetes mellitus with hypercholesterolemia ([place]) 05/15/2013  . Obesity 05/15/2013  . Hypothyroidism 05/15/2013    Past Surgical History:  Procedure Laterality Date  . ABDOMINAL HERNIA REPAIR    . ABDOMINAL HYSTERECTOMY  1977  . BREAST EXCISIONAL BIOPSY Right   . KNEE ARTHROSCOPY Left 09/30/2014   Procedure: LEFT KNEE ARTHROSCOPY MEDIAL MENISECTOMY ABRASION CHONDROPLASTY SYNOVECTOMY SUPRAPATELLER CUFF;  Surgeon: Tobi Bastos, MD;  Location: [facility];  Service: Orthopedics;  Laterality: Left;     OB History   No obstetric history on file.      Home Medications    Prior to Admission medications   Medication Sig Start Date End Date Taking? Authorizing Provider  albuterol (VENTOLIN HFA) 108 (90 Base) MCG/ACT inhaler TAKE 2 PUFFS BY MOUTH EVERY 6 HOURS AS NEEDED FOR WHEEZE OR SHORTNESS OF BREATH 08/13/19  Yes Rakes, Connye Burkitt, FNP  amLODipine (NORVASC) 10 MG tablet Take 1 tablet (10 mg total) by mouth daily. 07/15/19  Yes Rakes, Connye Burkitt, FNP  benzonatate (TESSALON PERLES) 100 MG capsule Take 1 capsule (100 mg total) by mouth 3 (three) times daily as needed for cough. 10/10/19  Yes Rakes, Connye Burkitt, FNP  Biotin 10 MG CAPS Take 1 capsule by mouth daily.    Yes [provider]  calcium-vitamin D (OSCAL WITH D) 500-200 MG-UNIT TABS tablet TAKE 1  TABLET BY MOUTH EVERY DAY WITH BREAKFAST 09/08/19  Yes Rakes, Connye Burkitt, FNP  chlorpheniramine-HYDROcodone (TUSSIONEX) 10-8 MG/5ML SUER Take 5 mLs by mouth every 12 (twelve) hours as needed for cough (use if tessalon is not beneficial for cough). 10/10/19  Yes Rakes, Connye Burkitt, FNP  Cholecalciferol (VITAMIN D-3 PO) Take 1 capsule by mouth daily.    Yes [provider]  Cholecalciferol (VITAMIN D3) POWD Take by mouth.   Yes [provider]  diclofenac sodium (VOLTAREN) 1 % GEL Apply 2 g topically 4 (four) times daily. 05/05/19  Yes Hawks, Christy A, FNP  diphenhydrAMINE (BENADRYL) 25 MG tablet Take 50 mg by mouth at bedtime as needed for allergies.    Yes [provider]  gabapentin (NEURONTIN) 100 MG capsule Take 1 capsule (100 mg total) by mouth 4 (four) times daily as needed. 07/15/19 10/17/19 Yes Rakes, Connye Burkitt, FNP  ketoconazole (NIZORAL) 2 % cream APPLY TO AFFECTED AREA EVERY DAY 09/15/19  Yes Rakes, Connye Burkitt, FNP  levothyroxine (SYNTHROID) 100 MCG tablet Take 1 Tablet by mouth once daily BEFORE BREAKFAST 07/15/19  Yes Rakes, Connye Burkitt, FNP  lisinopril (ZESTRIL) 10 MG tablet Take 1 tablet (10 mg total) by mouth daily. 07/15/19  Yes Rakes, Connye Burkitt, FNP  lovastatin (MEVACOR) 40 MG tablet Take 1 tablet (40 mg total) by mouth at bedtime. 07/15/19  Yes Rakes, Connye Burkitt, FNP  Multiple Vitamin (MULTI-VITAMIN) tablet Take by mouth.   Yes [provider]  omeprazole (PRILOSEC) 20 MG capsule Take 1 capsule (20 mg total) by mouth 2 (two) times daily before a meal. 07/15/19  Yes Rakes, Connye Burkitt, FNP  predniSONE (DELTASONE) 20 MG tablet 2 po at sametime daily for 5 days 10/10/19  Yes Rakes, Connye Burkitt, FNP  pyridOXINE (VITAMIN B-6) 100 MG tablet Take 100 mg by mouth daily.   Yes [provider]  sertraline (ZOLOFT) 100 MG tablet Take 2 tablets (200 mg total) by mouth daily. 07/15/19 10/17/19 Yes Rakes, Connye Burkitt, FNP  aspirin 81 MG chewable tablet Chew 4 tablets by mouth daily.      [provider]  azithromycin (ZITHROMAX Z-PAK) 250 MG tablet As directed 10/10/19   Baruch Gouty, FNP  denosumab (PROLIA) 60 MG/ML SOSY injection Bring to your doctor to inject 60 mg subcutaneously. 09/29/19   Baruch Gouty, FNP  mupirocin ointment (BACTROBAN) 2 % Apply 1 application topically 2 (two) times daily. Patient not taking: Reported on 10/17/2019 01/17/18   Timmothy Euler, MD  nystatin (MYCOSTATIN/NYSTOP) powder Apply topically 4 (four) times daily. 04/10/18   Timmothy Euler, MD  nystatin cream (MYCOSTATIN) Apply 1 application topically 2 (two) times daily. 04/10/18   Timmothy Euler, MD  sodium chloride (OCEAN) 0.65 % SOLN nasal spray Place 1 spray into both nostrils as needed for congestion. 05/24/17   Timmothy Euler, MD    Family History Family History  Problem Relation Age of Onset  . Hypertension Mother   . Stroke Mother   . Cancer Father  Social History Social History   Tobacco Use  . Smoking status: Never Smoker  . Smokeless tobacco: Never Used  Substance Use Topics  . Alcohol use: No  . Drug use: No     Allergies   Crestor [rosuvastatin], Nsaids, Penicillins, Vytorin [ezetimibe-simvastatin], and Tolmetin   Review of Systems Review of Systems  Constitutional: Negative for appetite change and fatigue.  HENT: Negative for congestion, ear discharge and sinus pressure.   Eyes: Negative for discharge.  Respiratory: Negative for cough.   Cardiovascular: Negative for chest pain.  Gastrointestinal: Negative for abdominal pain and diarrhea.  Genitourinary: Positive for flank pain. Negative for frequency and hematuria.  Musculoskeletal: Negative for back pain.  Skin: Negative for rash.  Neurological: Negative for seizures and headaches.  Psychiatric/Behavioral: Negative for hallucinations.     Physical Exam Updated Vital Signs BP (!) 167/84   Pulse 85   Temp 98.4 F (36.9 C) (Oral)   Resp (!) 24   Ht 5\' 3"  (1.6 m)   Wt 81.6 kg    SpO2 95%   BMI 31.89 kg/m   Physical Exam Vitals signs and nursing note reviewed.  Constitutional:      Appearance: She is well-developed.  HENT:     Head: Normocephalic.     Nose: Nose normal.  Eyes:     General: No scleral icterus.    Conjunctiva/sclera: Conjunctivae normal.  Neck:     Musculoskeletal: Neck supple.     Thyroid: No thyromegaly.  Cardiovascular:     Rate and Rhythm: Normal rate and regular rhythm.     Heart sounds: No murmur. No friction rub. No gallop.   Pulmonary:     Breath sounds: No stridor. No wheezing or rales.  Chest:     Chest wall: No tenderness.  Abdominal:     General: There is no distension.     Tenderness: There is no abdominal tenderness. There is no rebound.  Musculoskeletal: Normal range of motion.     Comments: Tender right flank  Lymphadenopathy:     Cervical: No cervical adenopathy.  Skin:    Findings: No erythema or rash.  Neurological:     Mental Status: She is oriented to person, place, and time.     Motor: No abnormal muscle tone.     Coordination: Coordination normal.  Psychiatric:        Behavior: Behavior normal.      ED Treatments / Results  Labs (all labs ordered are listed, but only abnormal results are displayed) Labs Reviewed  BASIC METABOLIC PANEL - Abnormal; Notable for the following components:      Result Value   Chloride 95 (*)    Glucose, Bld 133 (*)    All other components within normal limits  CBC - Abnormal; Notable for the following components:   Hemoglobin 15.6 (*)    HCT 51.0 (*)    All other components within normal limits  URINE CULTURE  LIPASE, BLOOD  URINALYSIS, ROUTINE W REFLEX MICROSCOPIC    EKG None  Radiology Ct Renal Stone Study  Result Date: 10/17/2019 CLINICAL DATA:  Right-sided flank pain EXAM: CT ABDOMEN AND PELVIS WITHOUT CONTRAST TECHNIQUE: Multidetector CT imaging of the abdomen and pelvis was performed following the standard protocol without IV contrast. COMPARISON:  CT  06/11/2019, 09/29/2018 FINDINGS: Lower chest: Lung bases demonstrate atelectasis or scarring in the right middle lobe and lingula. No consolidation or pleural effusion. Borderline to mild cardiomegaly. 11 mm distal Peri aortic node. Hepatobiliary: No focal hepatic  abnormality. Slight contour nodularity of the liver. No calcified gallstone or biliary dilatation Pancreas: Unremarkable. No pancreatic ductal dilatation or surrounding inflammatory changes. Spleen: Normal in size without focal abnormality. Adrenals/Urinary Tract: Adrenal glands are normal. Nonspecific perinephric fat stranding. No hydronephrosis. Stable probable cyst lower pole right kidney. Bladder is normal Stomach/Bowel: Stomach is within normal limits. Appendix appears normal. No evidence of bowel wall thickening, distention, or inflammatory changes. Sigmoid colon diverticular disease without acute inflammatory change. Vascular/Lymphatic: Moderate aortic atherosclerosis. No aneurysm. Small Peri aortic nodes. Minimal Peri aortic haziness. Reproductive: Status post hysterectomy. No adnexal masses. Other: Negative for free air or free fluid. Musculoskeletal: No acute or suspicious osseous abnormality. Chronic fracture deformity of T12 with sclerosis. Trace anterolisthesis L4 on L5. IMPRESSION: 1. No CT evidence for acute intra-abdominal or pelvic abnormality. Negative for hydronephrosis or ureteral stone. 2. Subtle contour nodularity of the liver, question cirrhosis 3. Sigmoid colon diverticular disease without acute inflammatory change Electronically Signed   By: Donavan Foil M.D.   On: 10/17/2019 22:48    Procedures Procedures (including critical care time)  Medications Ordered in ED Medications - No data to display   Initial Impression / Assessment and Plan / ED Course  I have reviewed the triage vital signs and the nursing notes.  Pertinent labs & imaging results that were available during my care of the patient were reviewed by me and  considered in my medical decision making (see chart for details).        Patient with flank pain.  CBC chemistries unremarkable.  CT scan of the abdomen shows no kidney stones.  Urinalysis pending, urinalysis shows UTI.  Patient given antibiotics and will follow up with PCP Final Clinical Impressions(s) / ED Diagnoses   Final diagnoses:  None    ED Discharge Orders    None       Milton Ferguson, MD 10/21/19 1204

## 2019-10-17 NOTE — ED Triage Notes (Signed)
Pt states that she is weak she is hurting on her right side around to her back.

## 2019-10-18 DIAGNOSIS — N3090 Cystitis, unspecified without hematuria: Secondary | ICD-10-CM | POA: Diagnosis not present

## 2019-10-18 LAB — D-DIMER, QUANTITATIVE: D-Dimer, Quant: 0.31 ug/mL-FEU (ref 0.00–0.50)

## 2019-10-18 MED ORDER — SODIUM CHLORIDE 0.9 % IV SOLN
1.0000 g | Freq: Once | INTRAVENOUS | Status: AC
  Administered 2019-10-18: 1 g via INTRAVENOUS
  Filled 2019-10-18: qty 10

## 2019-10-18 MED ORDER — CEPHALEXIN 500 MG PO CAPS: 500.0000 mg | ORAL_CAPSULE | Freq: Two times a day (BID) | ORAL | 0 refills | Status: DC

## 2019-10-18 MED ORDER — SODIUM CHLORIDE 0.9 % IV BOLUS
1000.0000 mL | Freq: Once | INTRAVENOUS | Status: AC
  Administered 2019-10-18: 1000 mL via INTRAVENOUS

## 2019-10-18 MED ORDER — FLUCONAZOLE 150 MG PO TABS
150.0000 mg | ORAL_TABLET | Freq: Once | ORAL | Status: AC
  Administered 2019-10-18: 150 mg via ORAL
  Filled 2019-10-18: qty 1

## 2019-10-18 NOTE — ED Provider Notes (Signed)
Care assumed from Dr. Roderic Palau.  Patient with right-sided abdominal pain and generalized weakness.  CT scan is reassuring.  Awaiting urinalysis.  UA shows pyuria.  Culture sent. CT scan shows no evidence of obstructive uropathy. D-dimer is negative.  Patient is tolerating p.o. and ambulatory with stable vital signs.  Labs are reassuring with stable creatinine.  She will be given a course of Keflex treat to for cystitis.  Increase oral hydration at home, PCP follow-up.  Return precautions discussed.  BP (!) 163/78 (BP Location: Left Arm)   Pulse 79   Temp 98.4 F (36.9 C) (Oral)   Resp 18   Ht 5\' 3"  (1.6 m)   Wt 81.6 kg   SpO2 96%   BMI 31.89 kg/m     Ezequiel Essex, MD 10/18/19 [id number]

## 2019-10-18 NOTE — ED Notes (Signed)
Pt ambulatory to restroom with no difficulty or distress noted.

## 2019-10-18 NOTE — Discharge Instructions (Signed)
Take the antibiotics as prescribed.  Follow up with your doctor. Return to the ED if you develop new or worsening symptoms. °

## 2019-10-19 LAB — URINE CULTURE: Culture: NO GROWTH

## 2019-10-22 ENCOUNTER — Emergency Department (HOSPITAL_COMMUNITY)
Admission: EM | Admit: 2019-10-22 | Discharge: 2019-10-22 | Disposition: A | Discharge status: Home / Self Care | Payer: Medicare Other | Attending: Emergency Medicine | Admitting: Emergency Medicine

## 2019-10-22 ENCOUNTER — Other Ambulatory Visit: Payer: Self-pay | Admitting: *Deleted

## 2019-10-22 ENCOUNTER — Encounter (HOSPITAL_COMMUNITY): Payer: Self-pay | Admitting: Emergency Medicine

## 2019-10-22 ENCOUNTER — Other Ambulatory Visit: Payer: Self-pay

## 2019-10-22 DIAGNOSIS — I110 Hypertensive heart disease with heart failure: Secondary | ICD-10-CM | POA: Insufficient documentation

## 2019-10-22 DIAGNOSIS — R1031 Right lower quadrant pain: Secondary | ICD-10-CM | POA: Diagnosis present

## 2019-10-22 DIAGNOSIS — E119 Type 2 diabetes mellitus without complications: Secondary | ICD-10-CM | POA: Insufficient documentation

## 2019-10-22 DIAGNOSIS — E039 Hypothyroidism, unspecified: Secondary | ICD-10-CM | POA: Diagnosis not present

## 2019-10-22 DIAGNOSIS — M25551 Pain in right hip: Secondary | ICD-10-CM | POA: Insufficient documentation

## 2019-10-22 DIAGNOSIS — Z79899 Other long term (current) drug therapy: Secondary | ICD-10-CM | POA: Diagnosis not present

## 2019-10-22 DIAGNOSIS — Z7982 Long term (current) use of aspirin: Secondary | ICD-10-CM | POA: Diagnosis not present

## 2019-10-22 DIAGNOSIS — Z20822 Contact with and (suspected) exposure to covid-19: Secondary | ICD-10-CM

## 2019-10-22 DIAGNOSIS — I509 Heart failure, unspecified: Secondary | ICD-10-CM | POA: Diagnosis not present

## 2019-10-22 DIAGNOSIS — M5416 Radiculopathy, lumbar region: Secondary | ICD-10-CM

## 2019-10-22 MED ORDER — TRAMADOL HCL 50 MG PO TABS: 50.0000 mg | ORAL_TABLET | Freq: Four times a day (QID) | ORAL | 0 refills | Status: DC | PRN

## 2019-10-22 NOTE — ED Provider Notes (Signed)
  Face-to-face evaluation   History: She complains of pain in right flank region for about 2 weeks, persistent, despite current treatment.  She was evaluated for the same, 6 days ago.  She denies dysuria, fever, chills, focal weakness or paresthesia.  Physical exam: Alert obese elderly female.  Mild tenderness right lower back, radiating across the room of the pelvis.  No associated skin changes which might be concerning for shingles.  Normal movement of arms and legs bilaterally.  Evaluation consistent with radiculopathy.  Doubt spinal myelopathy.  Medical screening examination/treatment/procedure(s) were conducted as a shared visit with non-physician practitioner(s) and myself.  I personally evaluated the patient during the encounter    Daleen Bo, MD 10/22/19 1526

## 2019-10-22 NOTE — ED Triage Notes (Signed)
Patient was seen here on Friday and was treated for a UTI. Patient states that she is not feeling better at this time and pain continues.

## 2019-10-22 NOTE — ED Provider Notes (Signed)
[facility] EMERGENCY DEPARTMENT Provider Note   CSN: [id number] Arrival date & time: 10/22/19  1301     History   Chief Complaint Chief Complaint  Patient presents with  . Flank Pain    HPI Danielle Parrish is a 78 y.o. female.     HPI   Danielle Parrish is a 78 y.o. female who presents to the Emergency Department complaining of continued pain of her right flank.  She was seen here on 10/18/2019 and treated with Keflex for cystitis.  She had CT renal stone study did did not show evidence of obstructive uropathy.  She has been taking Keflex twice daily without improvement.  She states that she was in the area today getting a Covid test and decided to seek evaluation for continued pain.  She describes the pain as aching and constant, unrelieved by rest or worsened by movement.  She has not taken any over-the-counter pain relievers for her symptoms.  She continues to deny fever, chills, abdominal pain, urine or bowel changes, pain, numbness, or weakness of her lower extremities.  Pain is isolated above her right hip   Past Medical History:  Diagnosis Date  . Anxiety   . Arthritis   . CHF (congestive heart failure) ([place])   . Chronic pain of multiple joints   . Depression   . Essential hypertension   . GERD (gastroesophageal reflux disease)   . Hyperlipidemia   . Hyperlipidemia associated with type 2 diabetes mellitus ([place])   . Hypertension associated with type 2 diabetes mellitus ([place])   . Hypothyroidism   . Osteoporosis   . Sleep apnea    CPAP  . Type 2 diabetes mellitus with hypercholesterolemia ([place])   . Vitamin D deficiency     Patient Active Problem List   Diagnosis Date Noted  . Age-related osteoporosis with current pathological fracture 03/13/2019  . Chronic midline low back pain without sciatica 01/30/2019  . Age-related osteoporosis with current pathol fracture of vertebra ([place]) 10/28/2018  . Calcification of aorta (H[facility]) 10/02/2018  . Compression fracture  of T12 vertebra ([place]) 10/01/2018  . Primary osteoarthritis of right knee 07/20/2017  . Meniscus degeneration, right 06/14/2017  . Varicose veins of left lower extremity with complications [id number]  . Pain medication agreement signed 06/06/2016  . Gout attack 01/11/2016  . OSA (obstructive sleep apnea) 09/14/2015  . Chronic diastolic heart failure ([place]) 09/10/2015  . Orthopnea 09/02/2015  . Dysphagia, pharyngoesophageal phase 08/04/2015  . Anorexia 08/04/2015  . Fatigue 08/04/2015  . Malaise and fatigue 08/04/2015  . Depression, recurrent ([place]) 04/08/2015  . Osteoarthritis of left knee 09/30/2014  . Vitamin D deficiency   . Anxiety   . GERD (gastroesophageal reflux disease)   . Arthritis   . Chronic pain of multiple joints   . Hypertension associated with type 2 diabetes mellitus ([place]) 05/15/2013  . Hyperlipidemia associated with type 2 diabetes mellitus ([place]) 05/15/2013  . Type 2 diabetes mellitus with hypercholesterolemia ([place]) 05/15/2013  . Obesity 05/15/2013  . Hypothyroidism 05/15/2013    Past Surgical History:  Procedure Laterality Date  . ABDOMINAL HERNIA REPAIR    . ABDOMINAL HYSTERECTOMY  1977  . BREAST EXCISIONAL BIOPSY Right   . KNEE ARTHROSCOPY Left 09/30/2014   Procedure: LEFT KNEE ARTHROSCOPY MEDIAL MENISECTOMY ABRASION CHONDROPLASTY SYNOVECTOMY SUPRAPATELLER CUFF;  Surgeon: Tobi Bastos, MD;  Location: [facility] ORS;  Service: Orthopedics;  Laterality: Left;     OB History   No obstetric history on file.  Home Medications    Prior to Admission medications   Medication Sig Start Date End Date Taking? Authorizing Provider  albuterol (VENTOLIN HFA) 108 (90 Base) MCG/ACT inhaler TAKE 2 PUFFS BY MOUTH EVERY 6 HOURS AS NEEDED FOR WHEEZE OR SHORTNESS OF BREATH 08/13/19   Baruch Gouty, FNP  amLODipine (NORVASC) 10 MG tablet Take 1 tablet (10 mg total) by mouth daily. 07/15/19   Baruch Gouty, FNP  aspirin 81 MG chewable tablet Chew 4 tablets by mouth daily.      [provider]  azithromycin (ZITHROMAX Z-PAK) 250 MG tablet As directed 10/10/19   Rakes, Connye Burkitt, FNP  benzonatate (TESSALON PERLES) 100 MG capsule Take 1 capsule (100 mg total) by mouth 3 (three) times daily as needed for cough. 10/10/19   Baruch Gouty, FNP  Biotin 10 MG CAPS Take 1 capsule by mouth daily.     [provider]  calcium-vitamin D (OSCAL WITH D) 500-200 MG-UNIT TABS tablet TAKE 1 TABLET BY MOUTH EVERY DAY WITH BREAKFAST 09/08/19   Rakes, Connye Burkitt, FNP  cephALEXin (KEFLEX) 500 MG capsule Take 1 capsule (500 mg total) by mouth 2 (two) times daily. 10/18/19   Rancour, Annie Main, MD  chlorpheniramine-HYDROcodone (TUSSIONEX) 10-8 MG/5ML SUER Take 5 mLs by mouth every 12 (twelve) hours as needed for cough (use if tessalon is not beneficial for cough). 10/10/19   Baruch Gouty, FNP  Cholecalciferol (VITAMIN D-3 PO) Take 1 capsule by mouth daily.     [provider]  Cholecalciferol (VITAMIN D3) POWD Take by mouth.    [provider]  denosumab (PROLIA) 60 MG/ML SOSY injection Bring to your doctor to inject 60 mg subcutaneously. 09/29/19   Baruch Gouty, FNP  diclofenac sodium (VOLTAREN) 1 % GEL Apply 2 g topically 4 (four) times daily. 05/05/19   Sharion Balloon, FNP  diphenhydrAMINE (BENADRYL) 25 MG tablet Take 50 mg by mouth at bedtime as needed for allergies.     [provider]  gabapentin (NEURONTIN) 100 MG capsule Take 1 capsule (100 mg total) by mouth 4 (four) times daily as needed. 07/15/19 10/17/19  Baruch Gouty, FNP  ketoconazole (NIZORAL) 2 % cream APPLY TO AFFECTED AREA EVERY DAY 09/15/19   Baruch Gouty, FNP  levothyroxine (SYNTHROID) 100 MCG tablet Take 1 Tablet by mouth once daily BEFORE BREAKFAST 07/15/19   Rakes, Connye Burkitt, FNP  lisinopril (ZESTRIL) 10 MG tablet Take 1 tablet (10 mg total) by mouth daily. 07/15/19   Baruch Gouty, FNP  lovastatin (MEVACOR) 40 MG tablet Take 1 tablet (40 mg total) by mouth at bedtime. 07/15/19    Baruch Gouty, FNP  Multiple Vitamin (MULTI-VITAMIN) tablet Take by mouth.    [provider]  mupirocin ointment (BACTROBAN) 2 % Apply 1 application topically 2 (two) times daily. Patient not taking: Reported on 10/17/2019 01/17/18   Timmothy Euler, MD  nystatin (MYCOSTATIN/NYSTOP) powder Apply topically 4 (four) times daily. 04/10/18   Timmothy Euler, MD  nystatin cream (MYCOSTATIN) Apply 1 application topically 2 (two) times daily. 04/10/18   Timmothy Euler, MD  omeprazole (PRILOSEC) 20 MG capsule Take 1 capsule (20 mg total) by mouth 2 (two) times daily before a meal. 07/15/19   Rakes, Connye Burkitt, FNP  predniSONE (DELTASONE) 20 MG tablet 2 po at sametime daily for 5 days 10/10/19   Baruch Gouty, FNP  pyridOXINE (VITAMIN B-6) 100 MG tablet Take 100 mg by mouth daily.    [provider]  sertraline (ZOLOFT) 100 MG tablet Take 2 tablets (200 mg total) by mouth daily. 07/15/19 10/17/19  Baruch Gouty, FNP  sodium chloride (OCEAN) 0.65 % SOLN nasal spray Place 1 spray into both nostrils as needed for congestion. 05/24/17   Timmothy Euler, MD    Family History Family History  Problem Relation Age of Onset  . Hypertension Mother   . Stroke Mother   . Cancer Father     Social History Social History   Tobacco Use  . Smoking status: Never Smoker  . Smokeless tobacco: Never Used  Substance Use Topics  . Alcohol use: No  . Drug use: No     Allergies   Crestor [rosuvastatin], Nsaids, Penicillins, Vytorin [ezetimibe-simvastatin], and Tolmetin   Review of Systems Review of Systems  Constitutional: Negative for chills and fever.  Respiratory: Negative for chest tightness and shortness of breath.   Cardiovascular: Negative for chest pain.  Gastrointestinal: Negative for abdominal pain, diarrhea, nausea and vomiting.  Genitourinary: Positive for flank pain. Negative for decreased urine volume, difficulty urinating, dysuria, frequency, vaginal bleeding and  vaginal discharge.  Musculoskeletal: Positive for arthralgias (right hip pain). Negative for back pain.  Skin: Negative for color change and wound.  Neurological: Negative for weakness and numbness.     Physical Exam Updated Vital Signs BP (!) 151/72 (BP Location: Right Arm)   Pulse 69   Temp 98.2 F (36.8 C) (Oral)   Resp 16   Ht 5\' 3"  (1.6 m)   Wt 81.6 kg   SpO2 92%   BMI 31.89 kg/m   Physical Exam Vitals signs and nursing note reviewed.  Constitutional:      Appearance: Normal appearance. She is not ill-appearing or toxic-appearing.  Cardiovascular:     Rate and Rhythm: Normal rate and regular rhythm.     Pulses: Normal pulses.  Pulmonary:     Effort: Pulmonary effort is normal.     Breath sounds: Normal breath sounds.  Chest:     Chest wall: No tenderness.  Abdominal:     General: There is no distension.     Palpations: Abdomen is soft.     Tenderness: There is no abdominal tenderness. There is no right CVA tenderness, left CVA tenderness or guarding.  Musculoskeletal:        General: No swelling, tenderness, deformity or signs of injury.     Comments: No tenderness to palpation of the lumbar spine.  Patient has full range of motion of the bilateral hips. Neg SLR bilaterally  Skin:    General: Skin is warm.     Capillary Refill: Capillary refill takes less than 2 seconds.     Findings: No rash.  Neurological:     Mental Status: She is alert.     Sensory: No sensory deficit.     Motor: No weakness.      ED Treatments / Results  Labs (all labs ordered are listed, but only abnormal results are displayed) Labs Reviewed - No data to display  EKG None  Radiology No results found.  Procedures Procedures (including critical care time)  Medications Ordered in ED Medications - No data to display   Initial Impression / Assessment and Plan / ED Course  I have reviewed the triage vital signs and the nursing notes.  Pertinent labs & imaging results that  were available during my care of the patient were reviewed by me and considered in my medical decision making (see chart for details).  Patient seen here 4 days ago for same.  She is currently taking cephalexin.  She returns today due to continued pain.  Pain appears to be isolated to the right hip.  I cannot reproduce the pain with range of motion, but CT imaging from previous visit does show some arthropathy of the lower spine.  This may be contributing to patient's symptoms which seem radicular.  Clinically, she is well-appearing and nontoxic.  No abdominal tenderness on exam.  Vitals reviewed.  No concerning symptoms for cauda equina or emergent neurological process.  Urine culture from previous visit reviewed and demonstrated no growth.  Patient also seen by Dr. Eulis Foster and care plan discussed.  Will have patient discontinue her Keflex and prescription written for short course of pain medication, I will defer prescribing steroids since pt recently completed a steroid course.   Final Clinical Impressions(s) / ED Diagnoses   Final diagnoses:  Hip pain, right    ED Discharge Orders    None       Kem Parkinson, PA-C 10/22/19 1424    Daleen Bo, MD 10/22/19 1526

## 2019-10-22 NOTE — Discharge Instructions (Addendum)
Try alternating ice and heat to your hip area.  Follow-up with your primary provider for recheck.

## 2019-10-23 LAB — NOVEL CORONAVIRUS, NAA: SARS-CoV-2, NAA: NOT DETECTED

## 2019-10-27 ENCOUNTER — Telehealth: Payer: Self-pay | Admitting: Family Medicine

## 2019-10-27 NOTE — Telephone Encounter (Signed)
Patient aware that [facility] office called and results faxed.

## 2019-10-27 NOTE — Telephone Encounter (Signed)
Patient informed of negative covid 19 result. She verbalized understanding.

## 2019-10-30 ENCOUNTER — Ambulatory Visit: Payer: Medicare Other

## 2019-11-06 ENCOUNTER — Other Ambulatory Visit: Payer: Self-pay

## 2019-11-07 ENCOUNTER — Ambulatory Visit (INDEPENDENT_AMBULATORY_CARE_PROVIDER_SITE_OTHER): Payer: Medicare Other

## 2019-11-07 DIAGNOSIS — M810 Age-related osteoporosis without current pathological fracture: Secondary | ICD-10-CM | POA: Diagnosis not present

## 2019-11-07 DIAGNOSIS — M8008XS Age-related osteoporosis with current pathological fracture, vertebra(e), sequela: Secondary | ICD-10-CM

## 2019-11-07 MED ORDER — DENOSUMAB 60 MG/ML ~~LOC~~ SOSY
60.0000 mg | PREFILLED_SYRINGE | Freq: Once | SUBCUTANEOUS | Status: AC
  Administered 2019-11-07: 60 mg via SUBCUTANEOUS

## 2019-11-07 NOTE — Progress Notes (Signed)
Prolia injection given to left upper arm.  Patient tolerated well.   Sent to pharmacy and delivered here.

## 2019-11-18 ENCOUNTER — Telehealth: Payer: Self-pay | Admitting: Family Medicine

## 2019-11-18 NOTE — Telephone Encounter (Signed)
No answer, no voicemail.

## 2019-11-28 NOTE — Telephone Encounter (Signed)
No answer, no voicemail, unable reach patient, encounter closed.

## 2019-12-01 ENCOUNTER — Other Ambulatory Visit: Payer: Self-pay | Admitting: Family Medicine

## 2019-12-01 ENCOUNTER — Other Ambulatory Visit: Payer: Self-pay

## 2019-12-01 ENCOUNTER — Telehealth: Payer: Self-pay | Admitting: Family Medicine

## 2019-12-01 DIAGNOSIS — B379 Candidiasis, unspecified: Secondary | ICD-10-CM

## 2019-12-01 MED ORDER — FLUCONAZOLE 150 MG PO TABS: 150.0000 mg | ORAL_TABLET | Freq: Once | ORAL | 0 refills | Status: AC

## 2019-12-01 NOTE — Telephone Encounter (Signed)
RX sent to pharmacy  

## 2019-12-01 NOTE — Telephone Encounter (Signed)
Pt aware.

## 2019-12-02 ENCOUNTER — Ambulatory Visit (INDEPENDENT_AMBULATORY_CARE_PROVIDER_SITE_OTHER): Payer: Medicare Other

## 2019-12-02 DIAGNOSIS — R300 Dysuria: Secondary | ICD-10-CM

## 2019-12-02 DIAGNOSIS — Z23 Encounter for immunization: Secondary | ICD-10-CM

## 2019-12-02 LAB — URINALYSIS, COMPLETE
Bilirubin, UA: NEGATIVE
Glucose, UA: NEGATIVE
Ketones, UA: NEGATIVE
Nitrite, UA: NEGATIVE
Protein,UA: NEGATIVE
RBC, UA: NEGATIVE
Specific Gravity, UA: 1.025 (ref 1.005–1.030)
Urobilinogen, Ur: 0.2 mg/dL (ref 0.2–1.0)
pH, UA: 5.5 (ref 5.0–7.5)

## 2019-12-02 LAB — MICROSCOPIC EXAMINATION: Renal Epithel, UA: NONE SEEN /hpf

## 2019-12-03 ENCOUNTER — Other Ambulatory Visit: Payer: Self-pay | Admitting: Family Medicine

## 2019-12-03 DIAGNOSIS — N3001 Acute cystitis with hematuria: Secondary | ICD-10-CM

## 2019-12-03 MED ORDER — CIPROFLOXACIN HCL 500 MG PO TABS: 500.0000 mg | ORAL_TABLET | Freq: Two times a day (BID) | ORAL | 0 refills | Status: DC

## 2019-12-05 ENCOUNTER — Ambulatory Visit: Payer: Self-pay | Admitting: Internal Medicine

## 2019-12-08 ENCOUNTER — Ambulatory Visit (INDEPENDENT_AMBULATORY_CARE_PROVIDER_SITE_OTHER): Payer: Medicare Other | Admitting: Family Medicine

## 2019-12-08 ENCOUNTER — Encounter: Payer: Self-pay | Admitting: Family Medicine

## 2019-12-08 DIAGNOSIS — R300 Dysuria: Secondary | ICD-10-CM | POA: Diagnosis not present

## 2019-12-08 DIAGNOSIS — R109 Unspecified abdominal pain: Secondary | ICD-10-CM | POA: Diagnosis not present

## 2019-12-08 NOTE — Progress Notes (Signed)
Virtual Visit via telephone Note Due to COVID-19 pandemic this visit was conducted virtually. This visit type was conducted due to national recommendations for restrictions regarding the COVID-19 Pandemic (e.g. social distancing, sheltering in place) in an effort to limit this patient's exposure and mitigate transmission in our community. All issues noted in this document were discussed and addressed.  A physical exam was not performed with this format.   78 connected with Danielle Parrish on 12/08/2019 at 1630 by telephone and verified that I am speaking with the correct person using two identifiers. Danielle Parrish is currently located at home and family is currently with them during visit. The provider, Monia Pouch, FNP is located in their office at time of visit.  I discussed the limitations, risks, security and privacy concerns of performing an evaluation and management service by telephone and the availability of in person appointments. I also discussed with the patient that there may be a patient responsible charge related to this service. The patient expressed understanding and agreed to proceed.  Subjective:  Patient ID: Danielle Parrish, female    DOB: 11-05-1941, 78 y.o.   MRN: [id number]  Chief Complaint:  Urinary Tract Infection   HPI: Danielle Parrish is a 78 y.o. female presenting on 12/08/2019 for Urinary Tract Infection   Pt reports ongoing right flank pain and dysuria. Pt recently completed Cipro for acute cystitis. States she completed the antibiotics yesterday and continues to have symptoms. States she does not have a fever. No chills, weakness, hematuria, confusion, or fever. No change in urine output.   Urinary Tract Infection  This is a recurrent problem. The current episode started 1 to 4 weeks ago. The problem occurs intermittently. The problem has been waxing and waning. The quality of the pain is described as burning, aching and stabbing. The pain is at a  severity of 4/10. The pain is mild. There has been no fever. She is not sexually active. There is no history of pyelonephritis. Associated symptoms include flank pain (chronic) and urgency. Pertinent negatives include no chills, discharge, frequency, hematuria, hesitancy, nausea, possible pregnancy, sweats or vomiting. She has tried antibiotics, acetaminophen and increased fluids for the symptoms. The treatment provided mild relief.     Relevant past medical, surgical, family, and social history reviewed and updated as indicated.  Allergies and medications reviewed and updated.   Past Medical History:  Diagnosis Date  . Anxiety   . Arthritis   . CHF (congestive heart failure) ([place])   . Chronic pain of multiple joints   . Depression   . Essential hypertension   . GERD (gastroesophageal reflux disease)   . Hyperlipidemia   . Hyperlipidemia associated with type 2 diabetes mellitus ([place])   . Hypertension associated with type 2 diabetes mellitus ([place])   . Hypothyroidism   . Osteoporosis   . Sleep apnea    CPAP  . Type 2 diabetes mellitus with hypercholesterolemia ([place])   . Vitamin D deficiency     Past Surgical History:  Procedure Laterality Date  . ABDOMINAL HERNIA REPAIR    . ABDOMINAL HYSTERECTOMY  1977  . BREAST EXCISIONAL BIOPSY Right   . KNEE ARTHROSCOPY Left 09/30/2014   Procedure: LEFT KNEE ARTHROSCOPY MEDIAL MENISECTOMY ABRASION CHONDROPLASTY SYNOVECTOMY SUPRAPATELLER CUFF;  Surgeon: Tobi Bastos, MD;  Location: [facility];  Service: Orthopedics;  Laterality: Left;    Social History   Socioeconomic History  . Marital status: Widowed    Spouse name: Laverna Peace  . Number  of children: 2  . Years of education: Not on file  . Highest education level: 12th grade  Occupational History  . Occupation: CNA    Comment: [employer]  . Smoking status: Never Smoker  . Smokeless tobacco: Never Used  Substance and Sexual Activity  . Alcohol use: No  . Drug use: No  .  Sexual activity: Not Currently  Other Topics Concern  . Not on file  Social History Narrative  . Not on file   Social Determinants of Health   Financial Resource Strain: Medium Risk  . Difficulty of Paying Living Expenses: Somewhat hard  Food Insecurity: No Food Insecurity  . Worried About Charity fundraiser in the Last Year: Never true  . Ran Out of Food in the Last Year: Never true  Transportation Needs:   . Lack of Transportation (Medical): Not on file  . Lack of Transportation (Non-Medical): Not on file  Physical Activity:   . Days of Exercise per Week: Not on file  . Minutes of Exercise per Session: Not on file  Stress: No Stress Concern Present  . Feeling of Stress : Only a little  Social Connections:   . Frequency of Communication with Friends and Family: Not on file  . Frequency of Social Gatherings with Friends and Family: Not on file  . Attends Religious Services: Not on file  . Active Member of Clubs or Organizations: Not on file  . Attends Archivist Meetings: Not on file  . Marital Status: Not on file  Intimate Partner Violence:   . Fear of Current or Ex-Partner: Not on file  . Emotionally Abused: Not on file  . Physically Abused: Not on file  . Sexually Abused: Not on file    Outpatient Encounter Medications as of 12/08/2019  Medication Sig  . albuterol (VENTOLIN HFA) 108 (90 Base) MCG/ACT inhaler TAKE 2 PUFFS BY MOUTH EVERY 6 HOURS AS NEEDED FOR WHEEZE OR SHORTNESS OF BREATH  . amLODipine (NORVASC) 10 MG tablet Take 1 tablet (10 mg total) by mouth daily.  Marland Kitchen aspirin 81 MG chewable tablet Chew 4 tablets by mouth daily.   Marland Kitchen azithromycin (ZITHROMAX Z-PAK) 250 MG tablet As directed  . benzonatate (TESSALON PERLES) 100 MG capsule Take 1 capsule (100 mg total) by mouth 3 (three) times daily as needed for cough.  . Biotin 10 MG CAPS Take 1 capsule by mouth daily.   . calcium-vitamin D (OSCAL WITH D) 500-200 MG-UNIT TABS tablet TAKE 1 TABLET BY MOUTH EVERY  DAY WITH BREAKFAST  . chlorpheniramine-HYDROcodone (TUSSIONEX) 10-8 MG/5ML SUER Take 5 mLs by mouth every 12 (twelve) hours as needed for cough (use if tessalon is not beneficial for cough).  . Cholecalciferol (VITAMIN D-3 PO) Take 1 capsule by mouth daily.   . Cholecalciferol (VITAMIN D3) POWD Take by mouth.  . denosumab (PROLIA) 60 MG/ML SOSY injection Bring to your doctor to inject 60 mg subcutaneously.  . diclofenac sodium (VOLTAREN) 1 % GEL Apply 2 g topically 4 (four) times daily.  . diphenhydrAMINE (BENADRYL) 25 MG tablet Take 50 mg by mouth at bedtime as needed for allergies.   Marland Kitchen gabapentin (NEURONTIN) 100 MG capsule Take 1 capsule (100 mg total) by mouth 4 (four) times daily as needed.  Marland Kitchen ketoconazole (NIZORAL) 2 % cream APPLY TO AFFECTED AREA EVERY DAY  . levothyroxine (SYNTHROID) 100 MCG tablet Take 1 Tablet by mouth once daily BEFORE BREAKFAST  . lisinopril (ZESTRIL) 10 MG tablet Take 1 tablet (  10 mg total) by mouth daily.  Marland Kitchen lovastatin (MEVACOR) 40 MG tablet Take 1 tablet (40 mg total) by mouth at bedtime.  . Multiple Vitamin (MULTI-VITAMIN) tablet Take by mouth.  . nystatin (MYCOSTATIN/NYSTOP) powder Apply topically 4 (four) times daily.  Marland Kitchen nystatin cream (MYCOSTATIN) Apply 1 application topically 2 (two) times daily.  Marland Kitchen omeprazole (PRILOSEC) 20 MG capsule Take 1 capsule (20 mg total) by mouth 2 (two) times daily before a meal.  . pyridOXINE (VITAMIN B-6) 100 MG tablet Take 100 mg by mouth daily.  . sertraline (ZOLOFT) 100 MG tablet Take 2 tablets (200 mg total) by mouth daily.  . sodium chloride (OCEAN) 0.65 % SOLN nasal spray Place 1 spray into both nostrils as needed for congestion.  . traMADol (ULTRAM) 50 MG tablet Take 1 tablet (50 mg total) by mouth every 6 (six) hours as needed.  . [DISCONTINUED] ciprofloxacin (CIPRO) 500 MG tablet Take 1 tablet (500 mg total) by mouth 2 (two) times daily for 5 days.  . [DISCONTINUED] mupirocin ointment (BACTROBAN) 2 % Apply 1 application  topically 2 (two) times daily. (Patient not taking: Reported on 10/17/2019)  . [DISCONTINUED] predniSONE (DELTASONE) 20 MG tablet 2 po at sametime daily for 5 days   No facility-administered encounter medications on file as of 12/08/2019.    Allergies  Allergen Reactions  . Crestor [Rosuvastatin] Other (See Comments)    Body aches.   . Nsaids     Burns stomach.   . Penicillins Hives    Has patient had a PCN reaction causing immediate rash, facial/tongue/throat swelling, SOB or lightheadedness with hypotension: Yes Has patient had a PCN reaction causing severe rash involving mucus membranes or skin necrosis: unknown Has patient had a PCN reaction that required hospitalization No Has patient had a PCN reaction occurring within the last 10 years: No If all of the above answers are "NO", then may proceed with Cephalosporin use.   . Vytorin [Ezetimibe-Simvastatin]     Body aches.   . Tolmetin Nausea Only    Burns stomach.     Review of Systems  Constitutional: Negative for activity change, appetite change, chills, diaphoresis, fatigue, fever and unexpected weight change.  HENT: Negative.   Eyes: Negative.  Negative for photophobia and visual disturbance.  Respiratory: Negative for cough, chest tightness and shortness of breath.   Cardiovascular: Negative for chest pain, palpitations and leg swelling.  Gastrointestinal: Negative for abdominal distention, abdominal pain, blood in stool, constipation, diarrhea, nausea and vomiting.  Endocrine: Negative.   Genitourinary: Positive for flank pain (chronic) and urgency. Negative for decreased urine volume, difficulty urinating, dysuria, frequency, hematuria, hesitancy, vaginal bleeding, vaginal discharge and vaginal pain.  Musculoskeletal: Positive for back pain. Negative for arthralgias, gait problem and myalgias.  Skin: Negative.   Allergic/Immunologic: Negative.   Neurological: Negative for dizziness, tremors, syncope, weakness,  light-headedness and headaches.  Hematological: Negative.   Psychiatric/Behavioral: Negative for confusion, hallucinations, sleep disturbance and suicidal ideas.  All other systems reviewed and are negative.        Observations/Objective: No vital signs or physical exam, this was a telephone or virtual health encounter.  Pt alert and oriented, answers all questions appropriately, and able to speak in full sentences.    Assessment and Plan: Kalyah was seen today for urinary tract infection.  Diagnoses and all orders for this visit:  Dysuria Abdominal discomfort in right flank Recurrent UTI symptoms with right flank pain despite recent completion of Cipro therapy. Pt will provide urine sample. Increase water  intake, avoid bladder irritants. Report any new or worsening symptoms. Pt aware of symptoms that require emergent care.  -     urinalysis- dip and micro; Future -     Urine culture; Future     Follow Up Instructions: Return if symptoms worsen or fail to improve.    I discussed the assessment and treatment plan with the patient. The patient was provided an opportunity to ask questions and all were answered. The patient agreed with the plan and demonstrated an understanding of the instructions.   The patient was advised to call back or seek an in-person evaluation if the symptoms worsen or if the condition fails to improve as anticipated.  The above assessment and management plan was discussed with the patient. The patient verbalized understanding of and has agreed to the management plan. Patient is aware to call the clinic if they develop any new symptoms or if symptoms persist or worsen. Patient is aware when to return to the clinic for a follow-up visit. Patient educated on when it is appropriate to go to the emergency department.    I provided 15 minutes of non-face-to-face time during this encounter. The call started at 1630. The call ended at [place]. The other time was used  for coordination of care.    Monia Pouch, FNP-C [facility] [address] [place], [place] [address] [phone] 12/08/2019

## 2019-12-09 ENCOUNTER — Other Ambulatory Visit: Payer: Medicare Other

## 2019-12-09 DIAGNOSIS — R109 Unspecified abdominal pain: Secondary | ICD-10-CM

## 2019-12-09 DIAGNOSIS — R300 Dysuria: Secondary | ICD-10-CM

## 2019-12-09 LAB — MICROSCOPIC EXAMINATION: Renal Epithel, UA: NONE SEEN /hpf

## 2019-12-09 LAB — URINALYSIS, COMPLETE
Bilirubin, UA: NEGATIVE
Glucose, UA: NEGATIVE
Ketones, UA: NEGATIVE
Nitrite, UA: NEGATIVE
Protein,UA: NEGATIVE
RBC, UA: NEGATIVE
Specific Gravity, UA: 1.02 (ref 1.005–1.030)
Urobilinogen, Ur: 0.2 mg/dL (ref 0.2–1.0)
pH, UA: 6 (ref 5.0–7.5)

## 2019-12-10 ENCOUNTER — Ambulatory Visit: Payer: Medicare Other | Admitting: Family Medicine

## 2019-12-10 ENCOUNTER — Other Ambulatory Visit: Payer: Self-pay

## 2019-12-10 LAB — URINE CULTURE

## 2019-12-11 ENCOUNTER — Other Ambulatory Visit: Payer: Self-pay

## 2019-12-11 ENCOUNTER — Other Ambulatory Visit: Payer: Self-pay | Admitting: Family Medicine

## 2019-12-11 DIAGNOSIS — B3749 Other urogenital candidiasis: Secondary | ICD-10-CM

## 2019-12-12 ENCOUNTER — Encounter: Payer: Self-pay | Admitting: Family Medicine

## 2019-12-12 ENCOUNTER — Ambulatory Visit (INDEPENDENT_AMBULATORY_CARE_PROVIDER_SITE_OTHER): Payer: Medicare Other | Admitting: Family Medicine

## 2019-12-12 VITALS — BP 128/75 | HR 80 | Temp 98.6°F | Resp 20 | Ht 63.0 in | Wt 190.0 lb

## 2019-12-12 DIAGNOSIS — E1169 Type 2 diabetes mellitus with other specified complication: Secondary | ICD-10-CM

## 2019-12-12 DIAGNOSIS — B379 Candidiasis, unspecified: Secondary | ICD-10-CM

## 2019-12-12 DIAGNOSIS — F339 Major depressive disorder, recurrent, unspecified: Secondary | ICD-10-CM

## 2019-12-12 DIAGNOSIS — E559 Vitamin D deficiency, unspecified: Secondary | ICD-10-CM

## 2019-12-12 DIAGNOSIS — R5383 Other fatigue: Secondary | ICD-10-CM

## 2019-12-12 DIAGNOSIS — R109 Unspecified abdominal pain: Secondary | ICD-10-CM

## 2019-12-12 DIAGNOSIS — M1711 Unilateral primary osteoarthritis, right knee: Secondary | ICD-10-CM

## 2019-12-12 DIAGNOSIS — E1159 Type 2 diabetes mellitus with other circulatory complications: Secondary | ICD-10-CM

## 2019-12-12 DIAGNOSIS — E039 Hypothyroidism, unspecified: Secondary | ICD-10-CM | POA: Diagnosis not present

## 2019-12-12 DIAGNOSIS — R5381 Other malaise: Secondary | ICD-10-CM

## 2019-12-12 DIAGNOSIS — I10 Essential (primary) hypertension: Secondary | ICD-10-CM

## 2019-12-12 DIAGNOSIS — G4733 Obstructive sleep apnea (adult) (pediatric): Secondary | ICD-10-CM

## 2019-12-12 DIAGNOSIS — R10A1 Flank pain, right side: Secondary | ICD-10-CM

## 2019-12-12 DIAGNOSIS — E7800 Pure hypercholesterolemia, unspecified: Secondary | ICD-10-CM

## 2019-12-12 DIAGNOSIS — E785 Hyperlipidemia, unspecified: Secondary | ICD-10-CM

## 2019-12-12 LAB — MICROSCOPIC EXAMINATION: Renal Epithel, UA: NONE SEEN /hpf

## 2019-12-12 LAB — URINALYSIS, COMPLETE
Bilirubin, UA: NEGATIVE
Glucose, UA: NEGATIVE
Ketones, UA: NEGATIVE
Nitrite, UA: NEGATIVE
Protein,UA: NEGATIVE
Specific Gravity, UA: 1.025 (ref 1.005–1.030)
Urobilinogen, Ur: 0.2 mg/dL (ref 0.2–1.0)
pH, UA: 5.5 (ref 5.0–7.5)

## 2019-12-12 LAB — BAYER DCA HB A1C WAIVED: HB A1C (BAYER DCA - WAIVED): 6.2 % (ref <7.0)

## 2019-12-12 MED ORDER — DICLOFENAC SODIUM 1 % EX GEL: 4.0000 g | Freq: Four times a day (QID) | CUTANEOUS | 5 refills | Status: DC

## 2019-12-12 MED ORDER — FLUCONAZOLE 150 MG PO TABS: ORAL_TABLET | ORAL | 0 refills | Status: DC

## 2019-12-12 NOTE — Progress Notes (Signed)
Culture pending, will treat if warranted. A1C 6.2, no change in therapy. Follow up in 3 months.

## 2019-12-12 NOTE — Patient Instructions (Signed)
Flank Pain, Adult Flank pain is pain in your side. The flank is the area of your side between your upper belly (abdomen) and your back. The pain may occur over a short time (acute), or it may be long-term or come back often (chronic). It may be mild or very bad. Pain in this area can be caused by many different things. Follow these instructions at home:   Drink enough fluid to keep your pee (urine) clear or pale yellow.  Rest as told by your doctor.  Take over-the-counter and prescription medicines only as told by your doctor.  Keep a journal to keep track of: ? What has caused your flank pain. ? What has made it feel better.  Keep all follow-up visits as told by your doctor. This is important. Contact a doctor if:  Medicine does not help your pain.  You have new symptoms.  Your pain gets worse.  You have a fever.  Your symptoms last longer than 2-3 days.  You have trouble peeing.  You are peeing more often than normal. Get help right away if:  You have trouble breathing.  You are short of breath.  Your belly hurts, or it is swollen or red.  You feel sick to your stomach (nauseous).  You throw up (vomit).  You feel like you will pass out, or you do pass out (faint).  You have blood in your pee. Summary  Flank pain is pain in your side. The flank is the area of your side between your upper belly (abdomen) and your back.  Flank pain may occur over a short time (acute), or it may be long-term or come back often (chronic). It may be mild or very bad.  Pain in this area can be caused by many different things.  Contact your doctor if your symptoms get worse or they last longer than 2-3 days. This information is not intended to replace advice given to you by your health care provider. Make sure you discuss any questions you have with your health care provider. Document Released: 09/19/2008 Document Revised: 11/23/2017 Document Reviewed: 04/02/2017 Elsevier Patient  Education  2020 [employer]  

## 2019-12-13 LAB — CMP14+EGFR
ALT: 23 IU/L (ref 0–32)
AST: 26 IU/L (ref 0–40)
Albumin/Globulin Ratio: 1.8 (ref 1.2–2.2)
Albumin: 4.7 g/dL (ref 3.7–4.7)
Alkaline Phosphatase: 78 IU/L (ref 39–117)
BUN/Creatinine Ratio: 23 (ref 12–28)
BUN: 14 mg/dL (ref 8–27)
Bilirubin Total: 0.3 mg/dL (ref 0.0–1.2)
CO2: 28 mmol/L (ref 20–29)
Calcium: 10.1 mg/dL (ref 8.7–10.3)
Chloride: 98 mmol/L (ref 96–106)
Creatinine, Ser: 0.61 mg/dL (ref 0.57–1.00)
GFR calc Af Amer: 100 mL/min/{1.73_m2} (ref >59)
GFR calc non Af Amer: 87 mL/min/{1.73_m2} (ref >59)
Globulin, Total: 2.6 g/dL (ref 1.5–4.5)
Glucose: 94 mg/dL (ref 65–99)
Potassium: 4.2 mmol/L (ref 3.5–5.2)
Sodium: 138 mmol/L (ref 134–144)
Total Protein: 7.3 g/dL (ref 6.0–8.5)

## 2019-12-13 LAB — LIPID PANEL
Chol/HDL Ratio: 4 ratio (ref 0.0–4.4)
Cholesterol, Total: 190 mg/dL (ref 100–199)
HDL: 48 mg/dL (ref >39)
LDL Chol Calc (NIH): 86 mg/dL (ref 0–99)
Triglycerides: 345 mg/dL — ABNORMAL HIGH (ref 0–149)
VLDL Cholesterol Cal: 56 mg/dL — ABNORMAL HIGH (ref 5–40)

## 2019-12-13 LAB — CBC WITH DIFFERENTIAL/PLATELET
Basophils Absolute: 0 10*3/uL (ref 0.0–0.2)
Basos: 1 %
EOS (ABSOLUTE): 0.1 10*3/uL (ref 0.0–0.4)
Eos: 1 %
Hematocrit: 44.1 % (ref 34.0–46.6)
Hemoglobin: 14.9 g/dL (ref 11.1–15.9)
Immature Grans (Abs): 0 10*3/uL (ref 0.0–0.1)
Immature Granulocytes: 0 %
Lymphocytes Absolute: 2.3 10*3/uL (ref 0.7–3.1)
Lymphs: 32 %
MCH: 31.6 pg (ref 26.6–33.0)
MCHC: 33.8 g/dL (ref 31.5–35.7)
MCV: 93 fL (ref 79–97)
Monocytes Absolute: 0.5 10*3/uL (ref 0.1–0.9)
Monocytes: 8 %
Neutrophils Absolute: 4.1 10*3/uL (ref 1.4–7.0)
Neutrophils: 58 %
Platelets: 221 10*3/uL (ref 150–450)
RBC: 4.72 x10E6/uL (ref 3.77–5.28)
RDW: 11.7 % (ref 11.7–15.4)
WBC: 7.1 10*3/uL (ref 3.4–10.8)

## 2019-12-13 LAB — THYROID PANEL WITH TSH
Free Thyroxine Index: 2.2 (ref 1.2–4.9)
T3 Uptake Ratio: 28 % (ref 24–39)
T4, Total: 7.7 ug/dL (ref 4.5–12.0)
TSH: 1.57 u[IU]/mL (ref 0.450–4.500)

## 2019-12-13 LAB — VITAMIN D 25 HYDROXY (VIT D DEFICIENCY, FRACTURES): Vit D, 25-Hydroxy: 32.4 ng/mL (ref 30.0–100.0)

## 2019-12-13 LAB — MICROALBUMIN / CREATININE URINE RATIO
Creatinine, Urine: 61 mg/dL
Microalb/Creat Ratio: 32 mg/g creat — ABNORMAL HIGH (ref 0–29)
Microalbumin, Urine: 19.6 ug/mL

## 2019-12-13 NOTE — Progress Notes (Addendum)
Subjective:  Patient ID: Ashleah Valtierra, female    DOB: [birth year], 78 y.o.   MRN: [id number]  Patient Care Team: Baruch Gouty, FNP as PCP - General (Family Medicine) Ilean China, RN as Case Manager Sandford Craze, MD as Referring Physician (Dermatology)   Chief Complaint:  ongoing right side pain (dysuria is better ) and Medical Management of Chronic Issues   HPI: Kashira Behunin is a 78 y.o. female presenting on 12/12/2019 for ongoing right side pain (dysuria is better ) and Medical Management of Chronic Issues  1. Abdominal discomfort in right flank Pt reports ongoing abdominal pain and right flank pain. This has been present for several months. States the pain is worse at times. She denies fever, chills, confusion, hematuria, nausea, vomiting, diarrhea, or constipation. She has had an abnormal CT in the past and was evaluated by vascular surgery who released her. She denies drastic weight changes.   2. Acquired hypothyroidism Pt is compliant with her medications. She does complain of fatigue, malaise, and general weakness. States she has noticed some increased depressive symptoms but nothing concerning. No skin, hair, or nail changes.   3. Hyperlipidemia associated with type 2 diabetes mellitus ([place]) Is compliant with medications. Does not diet or exercise. No adverse reactions to medications.   4. Hypertension associated with type 2 diabetes mellitus ([place]) Reports blood pressure has been well controlled. She is compliant with her medication regimen without associated side effects. No chest pain, palpitations, shortness of breath, dizziness, or leg swelling. No headaches.   5. Type 2 diabetes mellitus with hypercholesterolemia ([place]) Complaint with medications. No polyphagia, polydipsia, or polyuria. She does not exercise or watch diet. Has been well controlled.   6. Morbid obesity ([place]) Does not diet or exercise on a regular basis.   7. Vitamin D  deficiency Pt is taking oral repletion therapy. Denies bone pain and tenderness, muscle weakness, fracture, and difficulty walking. Lab Results  Component Value Date   VD25OH 48.8 07/17/2019   VD25OH 44.9 04/08/2015   VD25OH 60.3 01/08/2015   Lab Results  Component Value Date   CALCIUM 10.0 10/17/2019     8. Depression, recurrent ([place]) Doing well on current medications. Has been a little down due to the COVID-19 pandemic and sheltering in place. She denies SI or HI.    Office Visit from 07/15/2019 in [facility]  PHQ-9 Total Score  7      9. Malaise and fatigue Ongoing and worsening malaise and fatigue despite adequate sleeping. States she feels worse out most days and does not have the energy to complete certain tasks. States she is tired even if she sleeps well the night before.   10. Candidiasis Recently treated for an UTI with antibiotics. States she has had vaginal irritation, pruritis, and white colored discharge since completion of antibiotics. She has tried probiotics without relief of symptoms. No vaginal bleeding or pain. Not sexually active.   11. Primary osteoarthritis of right knee Ongoing pain in right knee. States worse after being on it all day. States she does take Tylenol at times which helps some with the pain. No injury. No new symptoms. Aching to sharp at times. 5/10 at worst.   12. OSA (obstructive sleep apnea) Pt wears a CPAP and needs a new one. Pt states her sleep study provider has retired and she needs to see someone to have a new sleep study completed.      Relevant past medical, surgical,  family, and social history reviewed and updated as indicated.  Allergies and medications reviewed and updated. Date reviewed: Chart in Epic.   Past Medical History:  Diagnosis Date  . Anxiety   . Arthritis   . CHF (congestive heart failure) ([place])   . Chronic pain of multiple joints   . Depression   . Essential hypertension   . GERD  (gastroesophageal reflux disease)   . Hyperlipidemia   . Hyperlipidemia associated with type 2 diabetes mellitus ([place])   . Hypertension associated with type 2 diabetes mellitus ([place])   . Hypothyroidism   . Osteoporosis   . Sleep apnea    CPAP  . Type 2 diabetes mellitus with hypercholesterolemia ([place])   . Vitamin D deficiency     Past Surgical History:  Procedure Laterality Date  . ABDOMINAL HERNIA REPAIR    . ABDOMINAL HYSTERECTOMY  1977  . BREAST EXCISIONAL BIOPSY Right   . EYE SURGERY  2020   bilateral cataract removal   . KNEE ARTHROSCOPY Left 09/30/2014   Procedure: LEFT KNEE ARTHROSCOPY MEDIAL MENISECTOMY ABRASION CHONDROPLASTY SYNOVECTOMY SUPRAPATELLER CUFF;  Surgeon: Tobi Bastos, MD;  Location: [facility];  Service: Orthopedics;  Laterality: Left;    Social History   Socioeconomic History  . Marital status: Widowed    Spouse name: Laverna Peace  . Number of children: 2  . Years of education: Not on file  . Highest education level: 12th grade  Occupational History  . Occupation: CNA    Comment: [employer]  . Smoking status: Never Smoker  . Smokeless tobacco: Never Used  Substance and Sexual Activity  . Alcohol use: No  . Drug use: No  . Sexual activity: Not Currently  Other Topics Concern  . Not on file  Social History Narrative  . Not on file   Social Determinants of Health   Financial Resource Strain: Medium Risk  . Difficulty of Paying Living Expenses: Somewhat hard  Food Insecurity: No Food Insecurity  . Worried About Charity fundraiser in the Last Year: Never true  . Ran Out of Food in the Last Year: Never true  Transportation Needs:   . Lack of Transportation (Medical): Not on file  . Lack of Transportation (Non-Medical): Not on file  Physical Activity:   . Days of Exercise per Week: Not on file  . Minutes of Exercise per Session: Not on file  Stress: No Stress Concern Present  . Feeling of Stress : Only a little  Social Connections:     . Frequency of Communication with Friends and Family: Not on file  . Frequency of Social Gatherings with Friends and Family: Not on file  . Attends Religious Services: Not on file  . Active Member of Clubs or Organizations: Not on file  . Attends Archivist Meetings: Not on file  . Marital Status: Not on file  Intimate Partner Violence:   . Fear of Current or Ex-Partner: Not on file  . Emotionally Abused: Not on file  . Physically Abused: Not on file  . Sexually Abused: Not on file    Outpatient Encounter Medications as of 12/12/2019  Medication Sig  . albuterol (VENTOLIN HFA) 108 (90 Base) MCG/ACT inhaler TAKE 2 PUFFS BY MOUTH EVERY 6 HOURS AS NEEDED FOR WHEEZE OR SHORTNESS OF BREATH  . amLODipine (NORVASC) 10 MG tablet Take 1 tablet (10 mg total) by mouth daily.  Marland Kitchen aspirin 81 MG chewable tablet Chew 4 tablets by mouth daily.   Marland Kitchen  Biotin 10 MG CAPS Take 1 capsule by mouth daily.   . calcium-vitamin D (OSCAL WITH D) 500-200 MG-UNIT TABS tablet TAKE 1 TABLET BY MOUTH EVERY DAY WITH BREAKFAST  . Cholecalciferol (VITAMIN D-3 PO) Take 1 capsule by mouth daily.   Marland Kitchen denosumab (PROLIA) 60 MG/ML SOSY injection Bring to your doctor to inject 60 mg subcutaneously.  . diphenhydrAMINE (BENADRYL) 25 MG tablet Take 50 mg by mouth at bedtime as needed for allergies.   Marland Kitchen gabapentin (NEURONTIN) 100 MG capsule Take 1 capsule (100 mg total) by mouth 4 (four) times daily as needed.  Marland Kitchen ketoconazole (NIZORAL) 2 % cream APPLY TO AFFECTED AREA EVERY DAY  . levothyroxine (SYNTHROID) 100 MCG tablet Take 1 Tablet by mouth once daily BEFORE BREAKFAST  . lisinopril (ZESTRIL) 10 MG tablet Take 1 tablet (10 mg total) by mouth daily.  Marland Kitchen lovastatin (MEVACOR) 40 MG tablet Take 1 tablet (40 mg total) by mouth at bedtime.  . Multiple Vitamin (MULTI-VITAMIN) tablet Take by mouth.  . Multiple Vitamins-Minerals (PRESERVISION AREDS 2 PO) Take by mouth.  . nystatin cream (MYCOSTATIN) Apply 1 application topically 2  (two) times daily.  Marland Kitchen omeprazole (PRILOSEC) 20 MG capsule Take 1 capsule (20 mg total) by mouth 2 (two) times daily before a meal.  . pyridOXINE (VITAMIN B-6) 100 MG tablet Take 100 mg by mouth daily.  . sertraline (ZOLOFT) 100 MG tablet Take 2 tablets (200 mg total) by mouth daily.  . sodium chloride (OCEAN) 0.65 % SOLN nasal spray Place 1 spray into both nostrils as needed for congestion.  . diclofenac Sodium (VOLTAREN) 1 % GEL Apply 4 g topically 4 (four) times daily.  . fluconazole (DIFLUCAN) 150 MG tablet 1 po q week x 4 weeks  . nystatin (MYCOSTATIN/NYSTOP) powder Apply topically 4 (four) times daily. (Patient not taking: Reported on 12/12/2019)  . [DISCONTINUED] azithromycin (ZITHROMAX Z-PAK) 250 MG tablet As directed  . [DISCONTINUED] benzonatate (TESSALON PERLES) 100 MG capsule Take 1 capsule (100 mg total) by mouth 3 (three) times daily as needed for cough.  . [DISCONTINUED] chlorpheniramine-HYDROcodone (TUSSIONEX) 10-8 MG/5ML SUER Take 5 mLs by mouth every 12 (twelve) hours as needed for cough (use if tessalon is not beneficial for cough).  . [DISCONTINUED] Cholecalciferol (VITAMIN D3) POWD Take by mouth.  . [DISCONTINUED] diclofenac sodium (VOLTAREN) 1 % GEL Apply 2 g topically 4 (four) times daily.  . [DISCONTINUED] traMADol (ULTRAM) 50 MG tablet Take 1 tablet (50 mg total) by mouth every 6 (six) hours as needed.   No facility-administered encounter medications on file as of 12/12/2019.    Allergies  Allergen Reactions  . Crestor [Rosuvastatin] Other (See Comments)    Body aches.   . Nsaids     Burns stomach.   . Penicillins Hives    Has patient had a PCN reaction causing immediate rash, facial/tongue/throat swelling, SOB or lightheadedness with hypotension: Yes Has patient had a PCN reaction causing severe rash involving mucus membranes or skin necrosis: unknown Has patient had a PCN reaction that required hospitalization No Has patient had a PCN reaction occurring within  the last 10 years: No If all of the above answers are "NO", then may proceed with Cephalosporin use.   . Vytorin [Ezetimibe-Simvastatin]     Body aches.   . Tolmetin Nausea Only    Burns stomach.     Review of Systems  Constitutional: Positive for activity change, appetite change and fatigue. Negative for chills, diaphoresis, fever and unexpected weight change.  HENT: Negative.  Eyes: Negative.  Negative for photophobia and visual disturbance.  Respiratory: Negative for cough, chest tightness and shortness of breath.   Cardiovascular: Negative for chest pain, palpitations and leg swelling.  Gastrointestinal: Positive for abdominal pain. Negative for abdominal distention, anal bleeding, blood in stool, constipation, diarrhea, nausea, rectal pain and vomiting.  Endocrine: Negative.  Negative for cold intolerance, heat intolerance, polydipsia, polyphagia and polyuria.  Genitourinary: Positive for flank pain (right) and vaginal discharge (white with vaginal pruritis). Negative for decreased urine volume, difficulty urinating, dysuria, frequency, hematuria, pelvic pain, urgency, vaginal bleeding and vaginal pain.  Musculoskeletal: Positive for arthralgias, back pain, gait problem (antalgic) and myalgias.  Skin: Negative.   Allergic/Immunologic: Negative.   Neurological: Positive for weakness (generalized). Negative for dizziness, tremors, seizures, syncope, facial asymmetry, speech difficulty, light-headedness, numbness and headaches.  Hematological: Negative.   Psychiatric/Behavioral: Negative for confusion, hallucinations, sleep disturbance and suicidal ideas.  All other systems reviewed and are negative.       Objective:  BP 128/75   Pulse 80   Temp 98.6 F (37 C)   Resp 20   Ht '5\' 3"'  (1.6 m)   Wt 190 lb (86.2 kg)   SpO2 96%   BMI 33.66 kg/m    Wt Readings from Last 3 Encounters:  12/12/19 190 lb (86.2 kg)  10/22/19 180 lb (81.6 kg)  10/17/19 180 lb (81.6 kg)    Physical  Exam Vitals and nursing note reviewed.  Constitutional:      General: She is not in acute distress.    Appearance: Normal appearance. She is well-developed and well-groomed. She is obese. She is not ill-appearing, toxic-appearing or diaphoretic.  HENT:     Head: Normocephalic and atraumatic.     Jaw: There is normal jaw occlusion.     Right Ear: Decreased hearing noted.     Left Ear: Decreased hearing noted.     Nose: Nose normal.     Mouth/Throat:     Lips: Pink.     Mouth: Mucous membranes are moist.     Pharynx: Oropharynx is clear. Uvula midline.  Eyes:     General: Lids are normal.     Extraocular Movements: Extraocular movements intact.     Conjunctiva/sclera: Conjunctivae normal.     Pupils: Pupils are equal, round, and reactive to light.  Neck:     Thyroid: No thyroid mass, thyromegaly or thyroid tenderness.     Vascular: No carotid bruit or JVD.     Trachea: Trachea and phonation normal.  Cardiovascular:     Rate and Rhythm: Normal rate and regular rhythm.     Chest Wall: PMI is not displaced.     Pulses: Normal pulses.     Heart sounds: Normal heart sounds. No murmur. No friction rub. No gallop.   Pulmonary:     Effort: Pulmonary effort is normal. No respiratory distress.     Breath sounds: Normal breath sounds. No wheezing.  Abdominal:     General: Bowel sounds are normal. There is no distension or abdominal bruit.     Palpations: Abdomen is soft. There is no hepatomegaly or splenomegaly.     Tenderness: There is no abdominal tenderness. There is no right CVA tenderness or left CVA tenderness.     Hernia: No hernia is present.  Musculoskeletal:     Cervical back: Normal range of motion and neck supple.     Thoracic back: Normal.     Lumbar back: Tenderness present. No swelling, edema, deformity, signs of  trauma, lacerations, spasms or bony tenderness. Normal range of motion. Negative right straight leg raise test and negative left straight leg raise test. No  scoliosis.       Back:     Right upper leg: Normal.     Right knee: No swelling, deformity, effusion, erythema, ecchymosis, lacerations, bony tenderness or crepitus. Decreased range of motion. Tenderness present. Normal pulse.     Right lower leg: Normal. No edema.     Left lower leg: No edema.  Lymphadenopathy:     Cervical: No cervical adenopathy.  Skin:    General: Skin is warm and dry.     Capillary Refill: Capillary refill takes less than 2 seconds.     Coloration: Skin is not cyanotic, jaundiced or pale.     Findings: No rash.  Neurological:     General: No focal deficit present.     Mental Status: She is alert and oriented to person, place, and time.     Cranial Nerves: Cranial nerves are intact. No cranial nerve deficit.     Sensory: Sensation is intact. No sensory deficit.     Motor: Motor function is intact. No weakness.     Coordination: Coordination is intact. Coordination normal.     Gait: Gait abnormal (antalgic gait).     Deep Tendon Reflexes: Reflexes are normal and symmetric. Reflexes normal.  Psychiatric:        Attention and Perception: Attention and perception normal.        Mood and Affect: Affect normal. Mood is depressed.        Speech: Speech normal.        Behavior: Behavior normal. Behavior is cooperative.        Thought Content: Thought content normal.        Cognition and Memory: Cognition and memory normal.        Judgment: Judgment normal.     Results for orders placed or performed in visit on 12/12/19  Microscopic Examination   URINE  Result Value Ref Range   WBC, UA 6-10 (A) 0 - 5 /hpf   RBC 3-10 (A) 0 - 2 /hpf   Epithelial Cells (non renal) 0-10 0 - 10 /hpf   Renal Epithel, UA None seen None seen /hpf   Bacteria, UA Few None seen/Few  hgba1c  Result Value Ref Range   HB A1C (BAYER DCA - WAIVED) 6.2 <7.0 %  urinalysis- dip and micro  Result Value Ref Range   Specific Gravity, UA 1.025 1.005 - 1.030   pH, UA 5.5 5.0 - 7.5   Color, UA  Yellow Yellow   Appearance Ur Clear Clear   Leukocytes,UA 1+ (A) Negative   Protein,UA Negative Negative/Trace   Glucose, UA Negative Negative   Ketones, UA Negative Negative   RBC, UA Trace (A) Negative   Bilirubin, UA Negative Negative   Urobilinogen, Ur 0.2 0.2 - 1.0 mg/dL   Nitrite, UA Negative Negative   Microscopic Examination See below:      urinalysis report above, culture pending  Pertinent labs & imaging results that were available during my care of the patient were reviewed by me and considered in my medical decision making.  Assessment & Plan:  Reda was seen today for ongoing right side pain.  Diagnoses and all orders for this visit:  Abdominal discomfort in right flank Ongoing and worsening right flank pain. Previous abnormal CT of abdomen. Was followed by vascular surgery but was released. Will order imaging today due to  ongoing symptoms. Urine culture pending, will treat if warranted.  -     CT Abdomen Pelvis W Contrast; Future -     urinalysis- dip and micro -     Urine culture -     Microscopic Examination  Acquired hypothyroidism Thyroid disease has been well controlled. Labs are pending. Adjustments to regimen will be made if warranted. Make sure to take medications on an empty stomach with a full glass of water. Make sure to avoid vitamins or supplements for at least 4 hours before and 4 hours after taking medications. Repeat labs in 3 months if adjustments are made and in 6 months if stable.   -     Thyroid Panel With TSH  Hyperlipidemia associated with type 2 diabetes mellitus ([place]) Diet encouraged - increase intake of fresh fruits and vegetables, increase intake of lean proteins. Bake, broil, or grill foods. Avoid fried, greasy, and fatty foods. Avoid fast foods. Increase intake of fiber-rich whole grains. Exercise encouraged - at least 150 minutes per week and advance as tolerated.  Goal BMI < 25. Continue medications as prescribed. Follow up in 3-6 months as  discussed.  -     Lipid panel  Hypertension associated with type 2 diabetes mellitus ([facility]) BP well controlled. Changes were not made in regimen today. Goal BP is 130/80. Pt aware to report any persistent high or low readings. DASH diet and exercise encouraged. Exercise at least 150 minutes per week and increase as tolerated. Goal BMI > 25. Stress management encouraged. Avoid nicotine and tobacco product use. Avoid excessive alcohol and NSAID's. Avoid more than 2000 mg of sodium daily. Medications as prescribed. Follow up as scheduled.  -     CT Abdomen Pelvis W Contrast; Future -     CMP14+EGFR -     CBC with Differential/Platelet  Type 2 diabetes mellitus with hypercholesterolemia ([facility]) A1C 6.2 today. No changes in regimen. Labs pending. Diet and exercise encouraged. Follow up in 3 months or sooner if needed.  -     CMP14+EGFR -     Microalbumin / creatinine urine ratio -     hgba1c  Morbid obesity ([place]) Diet and exercise encouraged. Labs pending.  -     CMP14+EGFR -     CBC with Differential/Platelet -     Lipid panel -     Thyroid Panel With TSH  Vitamin D deficiency Labs pending. Currently not on repletion therapy, will initiate if needed. Eat foods rich in Vit D including milk, orange juice, yogurt with vitamin D added, salmon or mackerel, canned tuna fish, cereals with vitamin D added, and cod liver oil. Get out in the sun but make sure to wear at least SPF 30 sunscreen.  -     Vitamin D 25 hydroxy  Depression, recurrent ([place]) Doing fairly well. Does not wish to change medications at this time. Will check thyroid function.  -     Thyroid Panel With TSH  Malaise and fatigue Ongoing symptoms associated with eight flank pain. Imaging ordered along with labs to rule out underlying causes such as malignancy, infectious process such as abscess, or electrolyte abnormalities / anemia.  -     CT Abdomen Pelvis W Contrast; Future -     CMP14+EGFR -     CBC with Differential/Platelet -      Thyroid Panel With TSH  Candidiasis Recent antibiotic therapy for UTI. Now with vaginal irritation, pruritis, and white discharge. Will treat with oral Diflucan. Report any  new, worsening, or persistent symptoms.  -     fluconazole (DIFLUCAN) 150 MG tablet; 1 po q week x 4 weeks  Primary osteoarthritis of right knee Ongoing pain in right knee, will trial below to see if beneficial. If symptoms persist, will refer to ortho.  -     diclofenac Sodium (VOLTAREN) 1 % GEL; Apply 4 g topically 4 (four) times daily.  OSA (obstructive sleep apnea) Pt wears CPAP and states is requesting new sleep medicine provider as hers retired. Referral placed today.  -     Ambulatory referral to Neurology    Total time spent with patient 40 mintues.  Greater than 50% of encounter spent in coordination of care/counseling.  Continue all other maintenance medications.  Follow up plan: Return in about 3 months (around 03/11/2020), or if symptoms worsen or fail to improve, for DM.  Continue healthy lifestyle choices, including diet (rich in fruits, vegetables, and lean proteins, and low in salt and simple carbohydrates) and exercise (at least 30 minutes of moderate physical activity daily).  Educational handout given for flank pain  The above assessment and management plan was discussed with the patient. The patient verbalized understanding of and has agreed to the management plan. Patient is aware to call the clinic if they develop any new symptoms or if symptoms persist or worsen. Patient is aware when to return to the clinic for a follow-up visit. Patient educated on when it is appropriate to go to the emergency department.   Monia Pouch, FNP-C [facility] [phone]

## 2019-12-14 ENCOUNTER — Other Ambulatory Visit: Payer: Self-pay | Admitting: Family Medicine

## 2019-12-14 DIAGNOSIS — G8929 Other chronic pain: Secondary | ICD-10-CM

## 2019-12-14 LAB — URINE CULTURE

## 2019-12-15 ENCOUNTER — Other Ambulatory Visit: Payer: Self-pay | Admitting: Family Medicine

## 2019-12-15 DIAGNOSIS — Z1231 Encounter for screening mammogram for malignant neoplasm of breast: Secondary | ICD-10-CM

## 2019-12-15 NOTE — Progress Notes (Signed)
Urine culture negative no need for treatment. Urine microalbumin 19.6, will continue to monitor. Liver function, kidney function, and glucose all normal. CBC, thyroid function, and vitamin D all normal. Glycerides very elevated at 345.  Need to limit fried, greasy, fatty, and fast foods in your diet.  Compliance with statin therapy is essential along with diet and exercise to lower these numbers.  We will recheck in 6 to 9 months.

## 2019-12-15 NOTE — Telephone Encounter (Signed)
I do not see any pain med? She said you knew about it. It is ok to wait till 12/16/19

## 2019-12-16 NOTE — Telephone Encounter (Signed)
I did not write for pain medications.

## 2019-12-16 NOTE — Telephone Encounter (Signed)
I explained to patient that we no longer had pain medication on her med list.  She said the last time she went to the hospital they had given her a few and she is almost out.  I informed the patient she would have to be seen and evaluated by Sharyn Lull.  Patient declines appointment.

## 2019-12-30 ENCOUNTER — Institutional Professional Consult (permissible substitution): Payer: Medicare Other | Admitting: Neurology

## 2019-12-30 ENCOUNTER — Telehealth: Payer: Self-pay

## 2019-12-30 NOTE — Telephone Encounter (Signed)
Spoke with the patient and she stated that she would rather see Dohmeier for a in office visit instead of doing a virtual visit. I advised her that Sheena from the sleep lab would contact her to r/s her appt. She was appreciative for the call.

## 2020-01-01 ENCOUNTER — Encounter: Payer: Self-pay | Admitting: Neurology

## 2020-01-01 ENCOUNTER — Other Ambulatory Visit: Payer: Self-pay

## 2020-01-01 ENCOUNTER — Ambulatory Visit (INDEPENDENT_AMBULATORY_CARE_PROVIDER_SITE_OTHER): Payer: Medicare Other | Admitting: Neurology

## 2020-01-01 VITALS — BP 130/79 | HR 68 | Temp 97.4°F | Ht 63.0 in | Wt 187.0 lb

## 2020-01-01 DIAGNOSIS — G4733 Obstructive sleep apnea (adult) (pediatric): Secondary | ICD-10-CM

## 2020-01-01 DIAGNOSIS — Z9989 Dependence on other enabling machines and devices: Secondary | ICD-10-CM | POA: Diagnosis not present

## 2020-01-01 NOTE — Progress Notes (Signed)
Subjective:    Patient ID: Danielle Parrish is a 79 y.o. female.  HPI     Star Age, MD, PhD [facility] [address], [address].O. [place], [place] [address]  Dear [name],   I saw your patient, Danielle Parrish, upon your kind request in my sleep clinic today for initial consultation of her sleep disorder, in particular, evaluation of her prior diagnosis of obstructive sleep apnea.  The patient is unaccompanied today.  As you know, Danielle Parrish is a 79 year old right-handed woman with an underlying medical history of hypertension, hyperlipidemia, reflux disease, depression, anxiety, congestive heart failure, hypothyroidism, osteoporosis, vitamin D deficiency, diabetes and obesity, who was previously diagnosed with obstructive sleep apnea and placed on CPAP therapy.  She had sleep study testing in 2004, I was able to review her split-night sleep study from 10/14/2003 which indicated severe obstructive sleep apnea with a baseline AHI of 103.8, O2 nadir of 83%.  She was advised to start CPAP therapy at 13 cm.  A CPAP compliance download was reviewed today from 12/03/2019 through 01/01/2020, which is a total of 30 days, during which time she used her machine only 15 days with percent use days greater than 4 hours at 43%, indicating suboptimal compliance with an average usage for days on treatment of 7 hours and 44 minutes, residual AHI at goal at 3.3/h, but leak high with a 95th percentile at 75.7 L/min on a pressure of 13 cm with EPR of 3. I reviewed your telemedicine note from 07/15/2019.  Her Epworth sleepiness score is 8 out of 24, fatigue severity score is 45 out of 63.  Her daughter lives with her, patient has another daughter in [place], [place].  She is a non-smoker and does not drink alcohol and drinks caffeine in the form of soda, about 10 ounce per day but sometimes more.  She reports that she does not always use her CPAP when she stays with her mother, she stays with her  3 year old mother and does not always know how long she will stay with her, she does not take her machine with her.  She uses nasal pillows and generally speaking gets her supplies on a 6 monthly basis from [employer].  She has been changing her filter on a regular basis.  She reports no issues using her machine and it looks like it was set up in 2017.  She reports a late bedtime of around 1 or 2 AM.  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  Rise time is generally between 10 and 1030.   Her Past Medical History Is Significant For: Past Medical History:  Diagnosis Date  . Anxiety   . Arthritis   . CHF (congestive heart failure) ([place])   . Chronic pain of multiple joints   . Depression   . Essential hypertension   . GERD (gastroesophageal reflux disease)   . Hyperlipidemia   . Hyperlipidemia associated with type 2 diabetes mellitus ([place])   . Hypertension associated with type 2 diabetes mellitus ([place])   . Hypothyroidism   . Osteoporosis   . Sleep apnea    CPAP  . Type 2 diabetes mellitus with hypercholesterolemia ([place])   . Vitamin D deficiency     Her Past Surgical History Is Significant For: Past Surgical History:  Procedure Laterality Date  . ABDOMINAL HERNIA REPAIR    . ABDOMINAL HYSTERECTOMY  1977  . BREAST EXCISIONAL BIOPSY Right   . EYE  SURGERY  2020   bilateral cataract removal   . KNEE ARTHROSCOPY Left 09/30/2014   Procedure: LEFT KNEE ARTHROSCOPY MEDIAL MENISECTOMY ABRASION CHONDROPLASTY SYNOVECTOMY SUPRAPATELLER CUFF;  Surgeon: Tobi Bastos, MD;  Location: [facility];  Service: Orthopedics;  Laterality: Left;    Her Family History Is Significant For: Family History  Problem Relation Age of Onset  . Hypertension Mother   . Stroke Mother   . Cancer Father     Her Social History Is Significant For: Social History   Socioeconomic History  . Marital status: Widowed    Spouse name: Laverna Peace  . Number of children: 2  . Years of education: Not on  file  . Highest education level: 12th grade  Occupational History  . Occupation: CNA    Comment: [employer]  . Smoking status: Never Smoker  . Smokeless tobacco: Never Used  Substance and Sexual Activity  . Alcohol use: No  . Drug use: No  . Sexual activity: Not Currently  Other Topics Concern  . Not on file  Social History Narrative  . Not on file   Social Determinants of Health   Financial Resource Strain: Medium Risk  . Difficulty of Paying Living Expenses: Somewhat hard  Food Insecurity: No Food Insecurity  . Worried About Charity fundraiser in the Last Year: Never true  . Ran Out of Food in the Last Year: Never true  Transportation Needs:   . Lack of Transportation (Medical): Not on file  . Lack of Transportation (Non-Medical): Not on file  Physical Activity:   . Days of Exercise per Week: Not on file  . Minutes of Exercise per Session: Not on file  Stress: No Stress Concern Present  . Feeling of Stress : Only a little  Social Connections:   . Frequency of Communication with Friends and Family: Not on file  . Frequency of Social Gatherings with Friends and Family: Not on file  . Attends Religious Services: Not on file  . Active Member of Clubs or Organizations: Not on file  . Attends Archivist Meetings: Not on file  . Marital Status: Not on file    Her Allergies Are:  Allergies  Allergen Reactions  . Crestor [Rosuvastatin] Other (See Comments)    Body aches.   . Nsaids     Burns stomach.   . Penicillins Hives    Has patient had a PCN reaction causing immediate rash, facial/tongue/throat swelling, SOB or lightheadedness with hypotension: Yes Has patient had a PCN reaction causing severe rash involving mucus membranes or skin necrosis: unknown Has patient had a PCN reaction that required hospitalization No Has patient had a PCN reaction occurring within the last 10 years: No If all of the above answers are "NO", then may proceed with  Cephalosporin use.   . Vytorin [Ezetimibe-Simvastatin]     Body aches.   . Tolmetin Nausea Only    Burns stomach.   :   Her Current Medications Are:  Outpatient Encounter Medications as of 01/01/2020  Medication Sig  . albuterol (VENTOLIN HFA) 108 (90 Base) MCG/ACT inhaler TAKE 2 PUFFS BY MOUTH EVERY 6 HOURS AS NEEDED FOR WHEEZE OR SHORTNESS OF BREATH  . amLODipine (NORVASC) 10 MG tablet Take 1 tablet (10 mg total) by mouth daily.  Marland Kitchen aspirin 81 MG chewable tablet Chew 4 tablets by mouth daily.   . Biotin 10 MG CAPS Take 1 capsule by mouth daily.   . calcium-vitamin D (OSCAL WITH D)  500-200 MG-UNIT TABS tablet TAKE 1 TABLET BY MOUTH EVERY DAY WITH BREAKFAST  . Cholecalciferol (VITAMIN D-3 PO) Take 1 capsule by mouth daily.   Marland Kitchen denosumab (PROLIA) 60 MG/ML SOSY injection Bring to your doctor to inject 60 mg subcutaneously.  . diclofenac Sodium (VOLTAREN) 1 % GEL Apply 4 g topically 4 (four) times daily.  . diphenhydrAMINE (BENADRYL) 25 MG tablet Take 50 mg by mouth at bedtime as needed for allergies.   Marland Kitchen gabapentin (NEURONTIN) 100 MG capsule TAKE 1 CAPSULE (100 MG TOTAL) BY MOUTH 4 (FOUR) TIMES DAILY AS NEEDED.  Marland Kitchen ketoconazole (NIZORAL) 2 % cream APPLY TO AFFECTED AREA EVERY DAY  . levothyroxine (SYNTHROID) 100 MCG tablet Take 1 Tablet by mouth once daily BEFORE BREAKFAST  . lisinopril (ZESTRIL) 10 MG tablet Take 1 tablet (10 mg total) by mouth daily.  Marland Kitchen lovastatin (MEVACOR) 40 MG tablet Take 1 tablet (40 mg total) by mouth at bedtime.  . Multiple Vitamin (MULTI-VITAMIN) tablet Take by mouth.  . Multiple Vitamins-Minerals (PRESERVISION AREDS 2 PO) Take by mouth.  . nystatin (MYCOSTATIN/NYSTOP) powder Apply topically 4 (four) times daily.  Marland Kitchen nystatin cream (MYCOSTATIN) Apply 1 application topically 2 (two) times daily.  Marland Kitchen omeprazole (PRILOSEC) 20 MG capsule Take 1 capsule (20 mg total) by mouth 2 (two) times daily before a meal.  . pyridOXINE (VITAMIN B-6) 100 MG tablet Take 100 mg by mouth  daily.  . sertraline (ZOLOFT) 100 MG tablet Take 2 tablets (200 mg total) by mouth daily.  . sodium chloride (OCEAN) 0.65 % SOLN nasal spray Place 1 spray into both nostrils as needed for congestion.  . [DISCONTINUED] fluconazole (DIFLUCAN) 150 MG tablet 1 po q week x 4 weeks (Patient not taking: Reported on 01/01/2020)   No facility-administered encounter medications on file as of 01/01/2020.  :  Review of Systems:  Out of a complete 14 point review of systems, all are reviewed and negative with the exception of these symptoms as listed below: Review of Systems  Constitutional: Negative.   HENT: Positive for hearing loss.   Eyes: Negative.   Respiratory: Positive for shortness of breath.   Cardiovascular: Negative.   Gastrointestinal: Negative.   Endocrine: Negative.   Genitourinary: Negative.   Allergic/Immunologic: Negative.   Neurological:       Memory loss Epworth Sleepiness Scale 0= would never doze 1= slight chance of dozing 2= moderate chance of dozing 3= high chance of dozing  Sitting and reading:0 Watching TV:1 Sitting inactive in a public place (ex. Theater or meeting):2 As a passenger in a car for an hour without a break: 0 Lying down to rest in the afternoon:2 Sitting and talking to someone:0 Sitting quietly after lunch (no alcohol):1 In a car, while stopped in traffic:0 Total:8   Psychiatric/Behavioral: Positive for confusion.       Depression Not enough sleep Decrease energy    Objective:  Neurological Exam  Physical Exam Physical Examination:   Vitals:   01/01/20 1519  BP: 130/79  Pulse: 68  Temp: (!) 97.4 F (36.3 C)    General Examination: The patient is a very pleasant 79 y.o. female in no acute distress. She appears well-developed and well-nourished and well groomed.   HEENT: Normocephalic, atraumatic, pupils are equal, round and reactive to light, extraocular tracking is good without limitation to gaze excursion or nystagmus noted. Hearing  is Mildly impaired, face is symmetric with normal facial animation, airway examination reveals severe mouth dryness, marginal dental hygiene, moderate airway crowding.  Tongue  protrudes centrally in palate elevates symmetrically.   Chest: Clear to auscultation without wheezing, rhonchi or crackles noted.  Heart: S1+S2+0, regular With a 2 out of 6 systolic murmur.   Abdomen: Soft, non-tender and non-distended with normal bowel sounds appreciated on auscultation.  Extremities: There is trace pitting edema in the distal lower extremities bilaterally.   Skin: Warm and dry without trophic changes noted.   Musculoskeletal: exam reveals no obvious joint deformities, tenderness or joint swelling or erythema.   Neurologically:  Mental status: The patient is awake, alert and oriented in all 4 spheres. Her immediate and remote memory, attention, language skills and fund of knowledge are appropriate. There is no evidence of aphasia, agnosia, apraxia or anomia. Speech is clear with normal prosody and enunciation. Thought process is linear. Mood is normal and affect is normal.  Cranial nerves II - XII are as described above under HEENT exam.  Motor exam: Normal bulk, strength and tone is noted. There is no tremor. Fine motor skills and coordination: grossly intact.  Cerebellar testing: No dysmetria or intention tremor. There is no truncal or gait ataxia.  Sensory exam: intact to light touch in the upper and lower extremities.  Gait, station and balance: She stands Slowly and with mild difficulty, walks with a cane.  Assessment and Plan:  In summary, Danielle Parrish is a very pleasant 79 y.o.-year old female with an underlying medical history of hypertension, hyperlipidemia, reflux disease, depression, anxiety, congestive heart failure, hypothyroidism, osteoporosis, vitamin D deficiency, diabetes and obesity, who Presents for evaluation of her obstructive sleep apnea.  She had a sleep study several years  ago but has a relatively new machine, her pressure setting indicates good reduction of her sleep disordered breathing but her compliance is suboptimal, primarily because she does not use it when she visits her mom.  She is advised to try to be consistent with her CPAP.  She usually gets supplies from her DME company, [employer] on a 6 monthly basis.  She also has some pillow inserts for her nasal pillows at home she believes.  She is advised to get her new supplies on a timely basis.  She is advised that we can follow-up routinely in 6 months, she can see one of our nurse practitioners.  We talked about the importance of treating obstructive sleep apnea and to be consistent with CPAP therapy.  We talked about the importance of keeping good sleep hygiene.  She is advised to call us with any interim questions or concerns.  I answered all her questions today and she was in agreement with the plan.  Thank you very much for allowing me to participate in the care of this nice patient. If I can be of any further assistance to you please do not hesitate to call me at [phone].  Sincerely,   Star Age, MD, PhD

## 2020-01-01 NOTE — Patient Instructions (Signed)
Please continue using your CPAP regularly. While your insurance requires that you use CPAP at least 4 hours each night on 70% of the nights, I recommend, that you not skip any nights and use it throughout the night if you can. Getting used to CPAP and staying with the treatment long term does take time and patience and discipline. Untreated obstructive sleep apnea when it is moderate to severe can have an adverse impact on cardiovascular health and raise her risk for heart disease, arrhythmias, hypertension, congestive heart failure, stroke and diabetes. Untreated obstructive sleep apnea causes sleep disruption, nonrestorative sleep, and sleep deprivation. This can have an impact on your day to day functioning and cause daytime sleepiness and impairment of cognitive function, memory loss, mood disturbance, and problems focussing. Using CPAP regularly can improve these symptoms.  Please try to use your CPAP consistently and also take it with you when you visit your mother.  Your CPAP machine is not quite old enough to be replaced, your set up date was in 2017.  You do need to get your supplies. Please call [facility] to replace your mask, headgear, filter and hose.  You can follow-up routinely with one of our nurse practitioners in 6 months.

## 2020-01-07 ENCOUNTER — Other Ambulatory Visit: Payer: Self-pay

## 2020-01-07 ENCOUNTER — Ambulatory Visit (HOSPITAL_COMMUNITY): Payer: Medicare Other

## 2020-01-07 ENCOUNTER — Ambulatory Visit (HOSPITAL_COMMUNITY)
Admission: RE | Admit: 2020-01-07 | Discharge: 2020-01-07 | Disposition: A | Discharge status: Home / Self Care | Payer: Medicare Other | Source: Ambulatory Visit | Attending: Family Medicine | Admitting: Family Medicine

## 2020-01-07 DIAGNOSIS — R109 Unspecified abdominal pain: Secondary | ICD-10-CM | POA: Insufficient documentation

## 2020-01-07 DIAGNOSIS — I10 Essential (primary) hypertension: Secondary | ICD-10-CM | POA: Insufficient documentation

## 2020-01-07 DIAGNOSIS — R5383 Other fatigue: Secondary | ICD-10-CM | POA: Insufficient documentation

## 2020-01-07 DIAGNOSIS — R5381 Other malaise: Secondary | ICD-10-CM | POA: Diagnosis present

## 2020-01-07 DIAGNOSIS — E1159 Type 2 diabetes mellitus with other circulatory complications: Secondary | ICD-10-CM | POA: Diagnosis present

## 2020-01-07 MED ORDER — IOHEXOL 300 MG/ML  SOLN
100.0000 mL | Freq: Once | INTRAMUSCULAR | Status: AC | PRN
  Administered 2020-01-07: 100 mL via INTRAVENOUS

## 2020-01-13 ENCOUNTER — Ambulatory Visit (HOSPITAL_COMMUNITY): Payer: Medicare Other

## 2020-01-14 ENCOUNTER — Encounter: Payer: Self-pay | Admitting: Family Medicine

## 2020-01-14 ENCOUNTER — Ambulatory Visit (INDEPENDENT_AMBULATORY_CARE_PROVIDER_SITE_OTHER): Payer: Medicare Other | Admitting: Family Medicine

## 2020-01-14 DIAGNOSIS — M2550 Pain in unspecified joint: Secondary | ICD-10-CM | POA: Diagnosis not present

## 2020-01-14 DIAGNOSIS — G8929 Other chronic pain: Secondary | ICD-10-CM

## 2020-01-14 DIAGNOSIS — M10072 Idiopathic gout, left ankle and foot: Secondary | ICD-10-CM

## 2020-01-14 MED ORDER — TRAMADOL HCL 50 MG PO TABS: 50.0000 mg | ORAL_TABLET | Freq: Three times a day (TID) | ORAL | 0 refills | Status: AC | PRN

## 2020-01-14 MED ORDER — PREDNISONE 20 MG PO TABS: ORAL_TABLET | ORAL | 0 refills | Status: DC

## 2020-01-14 NOTE — Progress Notes (Signed)
Virtual Visit via telephone Note Due to COVID-19 pandemic this visit was conducted virtually. This visit type was conducted due to national recommendations for restrictions regarding the COVID-19 Pandemic (e.g. social distancing, sheltering in place) in an effort to limit this patient's exposure and mitigate transmission in our community. All issues noted in this document were discussed and addressed.  A physical exam was not performed with this format.   I connected with Danielle Parrish on 01/14/2020 at 1020 by telephone and verified that I am speaking with the correct person using two identifiers. Danielle Parrish is currently located at home and family is currently with them during visit. The provider, Monia Pouch, FNP is located in their office at time of visit.  I discussed the limitations, risks, security and privacy concerns of performing an evaluation and management service by telephone and the availability of in person appointments. I also discussed with the patient that there may be a patient responsible charge related to this service. The patient expressed understanding and agreed to proceed.  Subjective:  Patient ID: Danielle Parrish, female    DOB: 28-Jul-1941, 79 y.o.   MRN: [id number]  Chief Complaint:  Gout   HPI: Danielle Parrish is a 79 y.o. female presenting on 01/14/2020 for Gout   Pt reports ongoing and worsening pain in her knees and back. States she has not been able to see pain management as she was trying to manage without medications. Pt states she now has gout in her left foot. States her ankle and heel is swollen, slightly red, and tender to touch. States she can not stand to have a sock or shoe on due to the pain. No injury or wounds. No chest pain or shortness of breath.     Relevant past medical, surgical, family, and social history reviewed and updated as indicated.  Allergies and medications reviewed and updated.   Past Medical History:  Diagnosis  Date  . Anxiety   . Arthritis   . CHF (congestive heart failure) ([place])   . Chronic pain of multiple joints   . Depression   . Essential hypertension   . GERD (gastroesophageal reflux disease)   . Hyperlipidemia   . Hyperlipidemia associated with type 2 diabetes mellitus ([place])   . Hypertension associated with type 2 diabetes mellitus ([place])   . Hypothyroidism   . Osteoporosis   . Sleep apnea    CPAP  . Type 2 diabetes mellitus with hypercholesterolemia ([place])   . Vitamin D deficiency     Past Surgical History:  Procedure Laterality Date  . ABDOMINAL HERNIA REPAIR    . ABDOMINAL HYSTERECTOMY  1977  . BREAST EXCISIONAL BIOPSY Right   . EYE SURGERY  2020   bilateral cataract removal   . KNEE ARTHROSCOPY Left 09/30/2014   Procedure: LEFT KNEE ARTHROSCOPY MEDIAL MENISECTOMY ABRASION CHONDROPLASTY SYNOVECTOMY SUPRAPATELLER CUFF;  Surgeon: Tobi Bastos, MD;  Location: [facility];  Service: Orthopedics;  Laterality: Left;    Social History   Socioeconomic History  . Marital status: Widowed    Spouse name: Laverna Peace  . Number of children: 2  . Years of education: Not on file  . Highest education level: 12th grade  Occupational History  . Occupation: CNA    Comment: [employer]  . Smoking status: Never Smoker  . Smokeless tobacco: Never Used  Substance and Sexual Activity  . Alcohol use: No  . Drug use: No  . Sexual activity: Not Currently  Other Topics Concern  . Not on file  Social History Narrative  . Not on file   Social Determinants of Health   Financial Resource Strain: Medium Risk  . Difficulty of Paying Living Expenses: Somewhat hard  Food Insecurity: No Food Insecurity  . Worried About Charity fundraiser in the Last Year: Never true  . Ran Out of Food in the Last Year: Never true  Transportation Needs:   . Lack of Transportation (Medical): Not on file  . Lack of Transportation (Non-Medical): Not on file  Physical Activity:   . Days of Exercise per  Week: Not on file  . Minutes of Exercise per Session: Not on file  Stress: No Stress Concern Present  . Feeling of Stress : Only a little  Social Connections:   . Frequency of Communication with Friends and Family: Not on file  . Frequency of Social Gatherings with Friends and Family: Not on file  . Attends Religious Services: Not on file  . Active Member of Clubs or Organizations: Not on file  . Attends Archivist Meetings: Not on file  . Marital Status: Not on file  Intimate Partner Violence:   . Fear of Current or Ex-Partner: Not on file  . Emotionally Abused: Not on file  . Physically Abused: Not on file  . Sexually Abused: Not on file    Outpatient Encounter Medications as of 01/14/2020  Medication Sig  . albuterol (VENTOLIN HFA) 108 (90 Base) MCG/ACT inhaler TAKE 2 PUFFS BY MOUTH EVERY 6 HOURS AS NEEDED FOR WHEEZE OR SHORTNESS OF BREATH  . amLODipine (NORVASC) 10 MG tablet Take 1 tablet (10 mg total) by mouth daily.  Marland Kitchen aspirin 81 MG chewable tablet Chew 4 tablets by mouth daily.   . Biotin 10 MG CAPS Take 1 capsule by mouth daily.   . calcium-vitamin D (OSCAL WITH D) 500-200 MG-UNIT TABS tablet TAKE 1 TABLET BY MOUTH EVERY DAY WITH BREAKFAST  . Cholecalciferol (VITAMIN D-3 PO) Take 1 capsule by mouth daily.   Marland Kitchen denosumab (PROLIA) 60 MG/ML SOSY injection Bring to your doctor to inject 60 mg subcutaneously.  . diclofenac Sodium (VOLTAREN) 1 % GEL Apply 4 g topically 4 (four) times daily.  . diphenhydrAMINE (BENADRYL) 25 MG tablet Take 50 mg by mouth at bedtime as needed for allergies.   Marland Kitchen gabapentin (NEURONTIN) 100 MG capsule TAKE 1 CAPSULE (100 MG TOTAL) BY MOUTH 4 (FOUR) TIMES DAILY AS NEEDED.  Marland Kitchen ketoconazole (NIZORAL) 2 % cream APPLY TO AFFECTED AREA EVERY DAY  . levothyroxine (SYNTHROID) 100 MCG tablet Take 1 Tablet by mouth once daily BEFORE BREAKFAST  . lisinopril (ZESTRIL) 10 MG tablet Take 1 tablet (10 mg total) by mouth daily.  Marland Kitchen lovastatin (MEVACOR) 40 MG  tablet Take 1 tablet (40 mg total) by mouth at bedtime.  . Multiple Vitamin (MULTI-VITAMIN) tablet Take by mouth.  . Multiple Vitamins-Minerals (PRESERVISION AREDS 2 PO) Take by mouth.  . nystatin (MYCOSTATIN/NYSTOP) powder Apply topically 4 (four) times daily.  Marland Kitchen nystatin cream (MYCOSTATIN) Apply 1 application topically 2 (two) times daily.  Marland Kitchen omeprazole (PRILOSEC) 20 MG capsule Take 1 capsule (20 mg total) by mouth 2 (two) times daily before a meal.  . predniSONE (DELTASONE) 20 MG tablet 2 po at sametime daily for 5 days  . pyridOXINE (VITAMIN B-6) 100 MG tablet Take 100 mg by mouth daily.  . sertraline (ZOLOFT) 100 MG tablet Take 2 tablets (200 mg total) by mouth daily.  . sodium chloride (OCEAN)  0.65 % SOLN nasal spray Place 1 spray into both nostrils as needed for congestion.  . traMADol (ULTRAM) 50 MG tablet Take 1 tablet (50 mg total) by mouth every 8 (eight) hours as needed for up to 5 days.   No facility-administered encounter medications on file as of 01/14/2020.    Allergies  Allergen Reactions  . Crestor [Rosuvastatin] Other (See Comments)    Body aches.   . Nsaids     Burns stomach.   . Penicillins Hives    Has patient had a PCN reaction causing immediate rash, facial/tongue/throat swelling, SOB or lightheadedness with hypotension: Yes Has patient had a PCN reaction causing severe rash involving mucus membranes or skin necrosis: unknown Has patient had a PCN reaction that required hospitalization No Has patient had a PCN reaction occurring within the last 10 years: No If all of the above answers are "NO", then may proceed with Cephalosporin use.   . Vytorin [Ezetimibe-Simvastatin]     Body aches.   . Tolmetin Nausea Only    Burns stomach.     Review of Systems  Constitutional: Negative for activity change, appetite change, chills, diaphoresis, fatigue, fever and unexpected weight change.  HENT: Negative.   Eyes: Negative.  Negative for photophobia and visual  disturbance.  Respiratory: Negative for cough, chest tightness, shortness of breath, wheezing and stridor.   Cardiovascular: Negative for chest pain, palpitations and leg swelling.  Gastrointestinal: Negative for abdominal pain, blood in stool, constipation, diarrhea, nausea and vomiting.  Endocrine: Negative.   Genitourinary: Negative for dysuria, frequency and urgency.  Musculoskeletal: Positive for arthralgias, back pain, gait problem and joint swelling. Negative for myalgias, neck pain and neck stiffness.  Skin: Positive for color change. Negative for rash and wound.  Allergic/Immunologic: Negative.   Neurological: Negative for dizziness, tremors, seizures, syncope, facial asymmetry, speech difficulty, weakness, light-headedness, numbness and headaches.  Hematological: Negative.   Psychiatric/Behavioral: Negative for confusion, hallucinations, sleep disturbance and suicidal ideas.  All other systems reviewed and are negative.        Observations/Objective: No vital signs or physical exam, this was a telephone or virtual health encounter.  Pt alert and oriented, answers all questions appropriately, and able to speak in full sentences.    Assessment and Plan: Danielle Parrish was seen today for gout.  Diagnoses and all orders for this visit:  Acute idiopathic gout of left foot Symptoms reported consistent with acute gout of left foot. Will initiate below with short term Ultram for pain control. Pt aware to report any new, worsening, or persistent symptoms.  -     predniSONE (DELTASONE) 20 MG tablet; 2 po at sametime daily for 5 days -     traMADol (ULTRAM) 50 MG tablet; Take 1 tablet (50 mg total) by mouth every 8 (eight) hours as needed for up to 5 days.  Chronic pain of multiple joints Has been managing with tylenol. States the pain has worsened over the last week. Will give short term Ultram for pain control.  -     traMADol (ULTRAM) 50 MG tablet; Take 1 tablet (50 mg total) by mouth  every 8 (eight) hours as needed for up to 5 days.     Follow Up Instructions: Return in about 4 weeks (around 02/11/2020), or if symptoms worsen or fail to improve.    I discussed the assessment and treatment plan with the patient. The patient was provided an opportunity to ask questions and all were answered. The patient agreed with the plan and  demonstrated an understanding of the instructions.   The patient was advised to call back or seek an in-person evaluation if the symptoms worsen or if the condition fails to improve as anticipated.  The above assessment and management plan was discussed with the patient. The patient verbalized understanding of and has agreed to the management plan. Patient is aware to call the clinic if they develop any new symptoms or if symptoms persist or worsen. Patient is aware when to return to the clinic for a follow-up visit. Patient educated on when it is appropriate to go to the emergency department.    I provided 15 minutes of non-face-to-face time during this encounter. The call started at 1020. The call ended at 1035. The other time was used for coordination of care.    Monia Pouch, FNP-C [facility] [address] [place], [place] [address] [phone] 01/14/2020

## 2020-01-23 ENCOUNTER — Telehealth: Payer: Medicare Other

## 2020-01-27 ENCOUNTER — Ambulatory Visit
Admission: RE | Admit: 2020-01-27 | Discharge: 2020-01-27 | Disposition: A | Discharge status: Home / Self Care | Payer: Medicare Other | Source: Ambulatory Visit | Attending: Family Medicine | Admitting: Family Medicine

## 2020-01-27 ENCOUNTER — Other Ambulatory Visit: Payer: Self-pay

## 2020-01-27 DIAGNOSIS — Z1231 Encounter for screening mammogram for malignant neoplasm of breast: Secondary | ICD-10-CM

## 2020-02-05 ENCOUNTER — Ambulatory Visit: Payer: Medicare Other | Admitting: *Deleted

## 2020-02-05 NOTE — Chronic Care Management (AMB) (Signed)
  Chronic Care Management   Outreach Note  02/05/2020 Name: Danielle Parrish MRN: [id number] DOB: [birth year]  Referred by: Baruch Gouty, FNP Reason for referral : Chronic Care Management (RN Follow up)   An unsuccessful telephone follow-up was attempted today. The patient was referred to the case management team for assistance with care management and care coordination.   Follow Up Plan: The care management team will reach out to the patient again over the next 30 days.   [name], BSN, RN-BC Embedded Chronic Care Manager [facility] [place] [facility] Direct Dial: [phone]

## 2020-02-09 ENCOUNTER — Other Ambulatory Visit: Payer: Self-pay | Admitting: Family Medicine

## 2020-02-09 DIAGNOSIS — M2550 Pain in unspecified joint: Secondary | ICD-10-CM

## 2020-02-09 DIAGNOSIS — G8929 Other chronic pain: Secondary | ICD-10-CM

## 2020-02-24 ENCOUNTER — Ambulatory Visit: Payer: Medicare Other | Admitting: *Deleted

## 2020-02-24 NOTE — Chronic Care Management (AMB) (Signed)
  Chronic Care Management   Outreach Note  02/24/2020 Name: Danielle Parrish MRN: [id number] DOB: September 25, 1941  Referred by: Baruch Gouty, FNP Reason for referral : Chronic Care Management (RN follow up)   An unsuccessful telephone follow-up was attempted today. The patient was referred to the case management team for assistance with care management and care coordination.   Follow Up Plan: The care management team will reach out to the patient again over the next 45 days.   [name], BSN, RN-BC Embedded Chronic Care Manager [facility] / [place] [facility] Direct Dial: [phone]

## 2020-02-26 ENCOUNTER — Encounter: Payer: Self-pay | Admitting: Family Medicine

## 2020-02-26 ENCOUNTER — Ambulatory Visit (INDEPENDENT_AMBULATORY_CARE_PROVIDER_SITE_OTHER): Payer: Medicare Other | Admitting: Family Medicine

## 2020-02-26 ENCOUNTER — Telehealth: Payer: Self-pay | Admitting: *Deleted

## 2020-02-26 DIAGNOSIS — M545 Low back pain, unspecified: Secondary | ICD-10-CM

## 2020-02-26 DIAGNOSIS — M79672 Pain in left foot: Secondary | ICD-10-CM

## 2020-02-26 DIAGNOSIS — G8929 Other chronic pain: Secondary | ICD-10-CM

## 2020-02-26 MED ORDER — LIDOCAINE 5 % EX PTCH: 1.0000 | MEDICATED_PATCH | CUTANEOUS | 0 refills | Status: DC

## 2020-02-26 NOTE — Progress Notes (Signed)
Virtual Visit via telephone Note Due to COVID-19 pandemic this visit was conducted virtually. This visit type was conducted due to national recommendations for restrictions regarding the COVID-19 Pandemic (e.g. social distancing, sheltering in place) in an effort to limit this patient's exposure and mitigate transmission in our community. All issues noted in this document were discussed and addressed.  A physical exam was not performed with this format.   I connected with Danielle Parrish on 02/26/2020 at 0800 by telephone and verified that I am speaking with the correct person using two identifiers. Danielle Parrish is currently located at home and no one is currently with them during visit. The provider, Monia Pouch, FNP is located in their office at time of visit.  I discussed the limitations, risks, security and privacy concerns of performing an evaluation and management service by telephone and the availability of in person appointments. I also discussed with the patient that there may be a patient responsible charge related to this service. The patient expressed understanding and agreed to proceed.  Subjective:  Patient ID: Danielle Parrish, female    DOB: [birth year], 79 y.o.   MRN: [id number]  Chief Complaint:  Abdominal Pain and Foot Pain   HPI: Danielle Parrish is a 79 y.o. female presenting on 02/26/2020 for Abdominal Pain and Foot Pain   Pt following up today for ongoing pain in left foot and right lower abdomen.   Abdominal Pain This is a chronic problem. The problem occurs daily. The problem has been waxing and waning. The pain is located in the RLQ and right flank. The pain is at a severity of 5/10. The pain is moderate. The quality of the pain is aching, cramping and sharp. The abdominal pain radiates to the right flank and RLQ. Associated symptoms include arthralgias and myalgias. Pertinent negatives include no anorexia, belching, constipation, diarrhea, dysuria, fever,  flatus, frequency, headaches, hematochezia, hematuria, melena, nausea, vomiting or weight loss. The pain is aggravated by certain positions, deep breathing, movement and palpation. The pain is relieved by nothing. She has tried oral narcotic analgesics and acetaminophen for the symptoms. The treatment provided mild relief. Prior diagnostic workup includes CT scan.  Foot Pain This is a chronic problem. The current episode started more than 1 month ago. The problem occurs daily. The problem has been waxing and waning. Associated symptoms include abdominal pain, arthralgias, joint swelling and myalgias. Pertinent negatives include no anorexia, change in bowel habit, chest pain, chills, congestion, coughing, diaphoresis, fatigue, fever, headaches, nausea, neck pain, numbness, rash, sore throat, swollen glands, urinary symptoms, vertigo, visual change, vomiting or weakness. The symptoms are aggravated by walking, standing and stress. She has tried acetaminophen, ice, heat, rest and NSAIDs for the symptoms. The treatment provided no relief.     Relevant past medical, surgical, family, and social history reviewed and updated as indicated.  Allergies and medications reviewed and updated.   Past Medical History:  Diagnosis Date  . Anxiety   . Arthritis   . CHF (congestive heart failure) ([place])   . Chronic pain of multiple joints   . Depression   . Essential hypertension   . GERD (gastroesophageal reflux disease)   . Hyperlipidemia   . Hyperlipidemia associated with type 2 diabetes mellitus ([place])   . Hypertension associated with type 2 diabetes mellitus ([place])   . Hypothyroidism   . Osteoporosis   . Sleep apnea    CPAP  . Type 2 diabetes mellitus with hypercholesterolemia ([place])   .  Vitamin D deficiency     Past Surgical History:  Procedure Laterality Date  . ABDOMINAL HERNIA REPAIR    . ABDOMINAL HYSTERECTOMY  1977  . BREAST EXCISIONAL BIOPSY Right   . EYE SURGERY  2020   bilateral cataract  removal   . KNEE ARTHROSCOPY Left 09/30/2014   Procedure: LEFT KNEE ARTHROSCOPY MEDIAL MENISECTOMY ABRASION CHONDROPLASTY SYNOVECTOMY SUPRAPATELLER CUFF;  Surgeon: Tobi Bastos, MD;  Location: [facility];  Service: Orthopedics;  Laterality: Left;    Social History   Socioeconomic History  . Marital status: Widowed    Spouse name: Laverna Peace  . Number of children: 2  . Years of education: Not on file  . Highest education level: 12th grade  Occupational History  . Occupation: CNA    Comment: [employer]  . Smoking status: Never Smoker  . Smokeless tobacco: Never Used  Substance and Sexual Activity  . Alcohol use: No  . Drug use: No  . Sexual activity: Not Currently  Other Topics Concern  . Not on file  Social History Narrative  . Not on file   Social Determinants of Health   Financial Resource Strain: Medium Risk  . Difficulty of Paying Living Expenses: Somewhat hard  Food Insecurity: No Food Insecurity  . Worried About Charity fundraiser in the Last Year: Never true  . Ran Out of Food in the Last Year: Never true  Transportation Needs:   . Lack of Transportation (Medical): Not on file  . Lack of Transportation (Non-Medical): Not on file  Physical Activity:   . Days of Exercise per Week: Not on file  . Minutes of Exercise per Session: Not on file  Stress: No Stress Concern Present  . Feeling of Stress : Only a little  Social Connections:   . Frequency of Communication with Friends and Family: Not on file  . Frequency of Social Gatherings with Friends and Family: Not on file  . Attends Religious Services: Not on file  . Active Member of Clubs or Organizations: Not on file  . Attends Archivist Meetings: Not on file  . Marital Status: Not on file  Intimate Partner Violence:   . Fear of Current or Ex-Partner: Not on file  . Emotionally Abused: Not on file  . Physically Abused: Not on file  . Sexually Abused: Not on file    Outpatient Encounter  Medications as of 02/26/2020  Medication Sig  . albuterol (VENTOLIN HFA) 108 (90 Base) MCG/ACT inhaler TAKE 2 PUFFS BY MOUTH EVERY 6 HOURS AS NEEDED FOR WHEEZE OR SHORTNESS OF BREATH  . amLODipine (NORVASC) 10 MG tablet Take 1 tablet (10 mg total) by mouth daily.  Marland Kitchen aspirin 81 MG chewable tablet Chew 4 tablets by mouth daily.   . Biotin 10 MG CAPS Take 1 capsule by mouth daily.   . calcium-vitamin D (OSCAL WITH D) 500-200 MG-UNIT TABS tablet TAKE 1 TABLET BY MOUTH EVERY DAY WITH BREAKFAST  . Cholecalciferol (VITAMIN D-3 PO) Take 1 capsule by mouth daily.   Marland Kitchen denosumab (PROLIA) 60 MG/ML SOSY injection Bring to your doctor to inject 60 mg subcutaneously.  . diclofenac Sodium (VOLTAREN) 1 % GEL Apply 4 g topically 4 (four) times daily.  . diphenhydrAMINE (BENADRYL) 25 MG tablet Take 50 mg by mouth at bedtime as needed for allergies.   Marland Kitchen gabapentin (NEURONTIN) 100 MG capsule TAKE 1 CAPSULE (100 MG TOTAL) BY MOUTH 4 (FOUR) TIMES DAILY AS NEEDED.  Marland Kitchen ketoconazole (NIZORAL) 2 %  cream APPLY TO AFFECTED AREA EVERY DAY  . levothyroxine (SYNTHROID) 100 MCG tablet Take 1 Tablet by mouth once daily BEFORE BREAKFAST  . lidocaine (LIDODERM) 5 % Place 1 patch onto the skin daily. Remove & Discard patch within 12 hours or as directed by MD  . lisinopril (ZESTRIL) 10 MG tablet Take 1 tablet (10 mg total) by mouth daily.  Marland Kitchen lovastatin (MEVACOR) 40 MG tablet Take 1 tablet (40 mg total) by mouth at bedtime.  . Multiple Vitamin (MULTI-VITAMIN) tablet Take by mouth.  . Multiple Vitamins-Minerals (PRESERVISION AREDS 2 PO) Take by mouth.  . nystatin (MYCOSTATIN/NYSTOP) powder Apply topically 4 (four) times daily.  Marland Kitchen nystatin cream (MYCOSTATIN) Apply 1 application topically 2 (two) times daily.  Marland Kitchen omeprazole (PRILOSEC) 20 MG capsule Take 1 capsule (20 mg total) by mouth 2 (two) times daily before a meal.  . predniSONE (DELTASONE) 20 MG tablet 2 po at sametime daily for 5 days  . pyridOXINE (VITAMIN B-6) 100 MG tablet Take  100 mg by mouth daily.  . sertraline (ZOLOFT) 100 MG tablet Take 2 tablets (200 mg total) by mouth daily.  . sodium chloride (OCEAN) 0.65 % SOLN nasal spray Place 1 spray into both nostrils as needed for congestion.   No facility-administered encounter medications on file as of 02/26/2020.    Allergies  Allergen Reactions  . Crestor [Rosuvastatin] Other (See Comments)    Body aches.   . Nsaids     Burns stomach.   . Penicillins Hives    Has patient had a PCN reaction causing immediate rash, facial/tongue/throat swelling, SOB or lightheadedness with hypotension: Yes Has patient had a PCN reaction causing severe rash involving mucus membranes or skin necrosis: unknown Has patient had a PCN reaction that required hospitalization No Has patient had a PCN reaction occurring within the last 10 years: No If all of the above answers are "NO", then may proceed with Cephalosporin use.   . Vytorin [Ezetimibe-Simvastatin]     Body aches.   . Tolmetin Nausea Only    Burns stomach.     Review of Systems  Constitutional: Positive for activity change. Negative for appetite change, chills, diaphoresis, fatigue, fever, unexpected weight change and weight loss.  HENT: Negative for congestion and sore throat.   Eyes: Negative for photophobia and visual disturbance.  Respiratory: Negative for cough and shortness of breath.   Cardiovascular: Negative for chest pain, palpitations and leg swelling.  Gastrointestinal: Positive for abdominal pain. Negative for abdominal distention, anal bleeding, anorexia, blood in stool, change in bowel habit, constipation, diarrhea, flatus, hematochezia, melena, nausea, rectal pain and vomiting.  Genitourinary: Negative for decreased urine volume, difficulty urinating, dysuria, frequency and hematuria.  Musculoskeletal: Positive for arthralgias, back pain, gait problem, joint swelling and myalgias. Negative for neck pain and neck stiffness.  Skin: Negative for rash.    Neurological: Negative for dizziness, vertigo, tremors, seizures, syncope, facial asymmetry, speech difficulty, weakness, light-headedness, numbness and headaches.  All other systems reviewed and are negative.        Observations/Objective: No vital signs or physical exam, this was a telephone or virtual health encounter.  Pt alert and oriented, answers all questions appropriately, and able to speak in full sentences.    Assessment and Plan: Danielle Parrish was seen today for abdominal pain and foot pain.  Diagnoses and all orders for this visit:  Chronic midline low back pain without sciatica Ongoing pain despite treatment with Voltaren. CT scan of abdomen negative for acute findings. Has compression fractures.  Will trial lidoderm patches to see if beneficial. Pt offered and declined referral to pain management and ortho.  -     lidocaine (LIDODERM) 5 %; Place 1 patch onto the skin daily. Remove & Discard patch within 12 hours or as directed by MD  Left foot pain Continued left foot pain despite therapy with prednisone and rest. Will refer to Podiatry for evaluation and treatment.  -     Ambulatory referral to Podiatry     Follow Up Instructions: Return in about 3 months (around 05/28/2020), or if symptoms worsen or fail to improve.    I discussed the assessment and treatment plan with the patient. The patient was provided an opportunity to ask questions and all were answered. The patient agreed with the plan and demonstrated an understanding of the instructions.   The patient was advised to call back or seek an in-person evaluation if the symptoms worsen or if the condition fails to improve as anticipated.  The above assessment and management plan was discussed with the patient. The patient verbalized understanding of and has agreed to the management plan. Patient is aware to call the clinic if they develop any new symptoms or if symptoms persist or worsen. Patient is aware when to return  to the clinic for a follow-up visit. Patient educated on when it is appropriate to go to the emergency department.    I provided 15 minutes of non-face-to-face time during this encounter. The call started at 0800. The call ended at 0815. The other time was used for coordination of care.    Monia Pouch, FNP-[facility] [address] [place], [place] [address] [phone] 02/26/2020

## 2020-02-26 NOTE — Telephone Encounter (Signed)
Prior Auth for Lidocaine 5% patches-In Process  Key: [id number] -   PA Case ID: [id number]   [facility] is reviewing your PA request. Typically an electronic response will be received within 72 hours. To check for an update later, open this request from your dashboard.

## 2020-03-02 NOTE — Telephone Encounter (Signed)
Plan approved / spoke with pt - aware ( she has already picked up med ) - [facility]

## 2020-03-10 ENCOUNTER — Other Ambulatory Visit: Payer: Self-pay | Admitting: Family Medicine

## 2020-03-10 DIAGNOSIS — M8000XS Age-related osteoporosis with current pathological fracture, unspecified site, sequela: Secondary | ICD-10-CM

## 2020-03-10 DIAGNOSIS — E1159 Type 2 diabetes mellitus with other circulatory complications: Secondary | ICD-10-CM

## 2020-03-12 ENCOUNTER — Other Ambulatory Visit: Payer: Self-pay | Admitting: Family Medicine

## 2020-03-12 DIAGNOSIS — B3749 Other urogenital candidiasis: Secondary | ICD-10-CM

## 2020-03-15 ENCOUNTER — Other Ambulatory Visit: Payer: Self-pay

## 2020-03-15 ENCOUNTER — Ambulatory Visit (INDEPENDENT_AMBULATORY_CARE_PROVIDER_SITE_OTHER): Payer: Medicare Other | Admitting: Family Medicine

## 2020-03-15 ENCOUNTER — Encounter: Payer: Self-pay | Admitting: Family Medicine

## 2020-03-15 VITALS — BP 148/77 | HR 65 | Temp 99.0°F | Resp 20 | Ht 63.0 in | Wt 189.0 lb

## 2020-03-15 DIAGNOSIS — I10 Essential (primary) hypertension: Secondary | ICD-10-CM | POA: Diagnosis not present

## 2020-03-15 DIAGNOSIS — M2550 Pain in unspecified joint: Secondary | ICD-10-CM

## 2020-03-15 DIAGNOSIS — E559 Vitamin D deficiency, unspecified: Secondary | ICD-10-CM

## 2020-03-15 DIAGNOSIS — E1169 Type 2 diabetes mellitus with other specified complication: Secondary | ICD-10-CM

## 2020-03-15 DIAGNOSIS — E039 Hypothyroidism, unspecified: Secondary | ICD-10-CM

## 2020-03-15 DIAGNOSIS — N3090 Cystitis, unspecified without hematuria: Secondary | ICD-10-CM

## 2020-03-15 DIAGNOSIS — R300 Dysuria: Secondary | ICD-10-CM

## 2020-03-15 DIAGNOSIS — R062 Wheezing: Secondary | ICD-10-CM

## 2020-03-15 DIAGNOSIS — G8929 Other chronic pain: Secondary | ICD-10-CM

## 2020-03-15 DIAGNOSIS — E785 Hyperlipidemia, unspecified: Secondary | ICD-10-CM

## 2020-03-15 DIAGNOSIS — E1159 Type 2 diabetes mellitus with other circulatory complications: Secondary | ICD-10-CM | POA: Diagnosis not present

## 2020-03-15 DIAGNOSIS — E7800 Pure hypercholesterolemia, unspecified: Secondary | ICD-10-CM | POA: Diagnosis not present

## 2020-03-15 DIAGNOSIS — I152 Hypertension secondary to endocrine disorders: Secondary | ICD-10-CM

## 2020-03-15 LAB — URINALYSIS, COMPLETE
Bilirubin, UA: NEGATIVE
Glucose, UA: NEGATIVE
Ketones, UA: NEGATIVE
Nitrite, UA: NEGATIVE
Protein,UA: NEGATIVE
RBC, UA: NEGATIVE
Specific Gravity, UA: 1.015 (ref 1.005–1.030)
Urobilinogen, Ur: 0.2 mg/dL (ref 0.2–1.0)
pH, UA: 6 (ref 5.0–7.5)

## 2020-03-15 LAB — BAYER DCA HB A1C WAIVED: HB A1C (BAYER DCA - WAIVED): 6.5 % (ref <7.0)

## 2020-03-15 LAB — MICROSCOPIC EXAMINATION: Renal Epithel, UA: NONE SEEN /hpf

## 2020-03-15 MED ORDER — LISINOPRIL 20 MG PO TABS: 20.0000 mg | ORAL_TABLET | Freq: Every day | ORAL | 3 refills | Status: DC

## 2020-03-15 MED ORDER — ALBUTEROL SULFATE HFA 108 (90 BASE) MCG/ACT IN AERS: INHALATION_SPRAY | RESPIRATORY_TRACT | 11 refills | Status: DC

## 2020-03-15 MED ORDER — GABAPENTIN 100 MG PO CAPS: 200.0000 mg | ORAL_CAPSULE | Freq: Three times a day (TID) | ORAL | 1 refills | Status: DC

## 2020-03-15 NOTE — Progress Notes (Signed)
Subjective:  Patient ID: Danielle Parrish, female    DOB: 13-Mar-1941, 79 y.o.   MRN: [id number]  Patient Care Team: Dettinger, Fransisca Kaufmann, MD as PCP - General (Family Medicine) Ilean China, RN as Case Manager Sandford Craze, MD as Referring Physician (Dermatology)   Chief Complaint:  Medical Management of Chronic Issues (3 mo ), Hypertension, and Hypothyroidism   HPI: Danielle Parrish is a 79 y.o. female presenting on 03/15/2020 for Medical Management of Chronic Issues (3 mo ), Hypertension, and Hypothyroidism    1. Type 2 diabetes mellitus with hypercholesterolemia ([place]) This has been diet controlled. Pt does not exercise on a regular basis due to chronic pain. States she does try to watch her diet. No polyuria, polyphagia, or polydipsia.   2. Acquired hypothyroidism Compliant with medications - Yes Current medications - Synthroid 100 mcg Adverse side effects - No Weight - stable  Bowel habit changes - No Heat or cold intolerance - No Mood changes - No Changes in sleep habits - No Fatigue - Yes Skin, hair, or nail changes - No Tremor - No Palpitations - No Edema - yes Shortness of breath - No  3. Hyperlipidemia associated with type 2 diabetes mellitus ([place]) Compliant with medications - Yes Current medications - lovastatin Side effects from medications - No Diet - tries to eat healthy, not always compliant Exercise - none Family and personal medical history reviewed. Smoking and ETOH history reviewed.    4. Hypertension associated with type 2 diabetes mellitus ([place]) Complaint with meds - Yes Current Medications - Norvasc, Lisinopril Checking BP at home ranging 180/80 Exercising Regularly - No Watching Salt intake - Yes Pertinent ROS:  Headache - No Fatigue - Yes Visual Disturbances - No Chest pain - No Dyspnea - No Palpitations - No LE edema - Yes They report good compliance with medications and can restate their regimen by memory. No medication  side effects. Family, social, and smoking history reviewed.   5. Morbid obesity ([place]) Does try to watch what she eats but does not exercise due to pain in knees, legs, and hips.   6. Vitamin D deficiency Pt is taking oral repletion therapy. Denies bone pain and tenderness, muscle weakness, fracture, and difficulty walking.   7. Dysuria Pt c/o dysuria that started a few days ago. No fever, chills, weakness, or flank pain. States she has dysuria with almost every void. No other associated symptoms.   8. Chronic pain of multiple joints Ongoing and worsening pain of bilateral knees and hips. Pt states she would like to see ortho to get injections. She does not wish to have steroid injections, would like regenerative or viscosupplementation   Relevant past medical, surgical, family, and social history reviewed and updated as indicated.  Allergies and medications reviewed and updated. Date reviewed: Chart in Epic.   Past Medical History:  Diagnosis Date  . Anxiety   . Arthritis   . CHF (congestive heart failure) ([place])   . Chronic pain of multiple joints   . Depression   . Essential hypertension   . GERD (gastroesophageal reflux disease)   . Hyperlipidemia   . Hyperlipidemia associated with type 2 diabetes mellitus ([place])   . Hypertension associated with type 2 diabetes mellitus ([place])   . Hypothyroidism   . Osteoporosis   . Sleep apnea    CPAP  . Type 2 diabetes mellitus with hypercholesterolemia ([place])   . Vitamin D deficiency     Past Surgical History:  Procedure Laterality Date  . ABDOMINAL HERNIA REPAIR    . ABDOMINAL HYSTERECTOMY  1977  . BREAST EXCISIONAL BIOPSY Right   . EYE SURGERY  2020   bilateral cataract removal   . KNEE ARTHROSCOPY Left 09/30/2014   Procedure: LEFT KNEE ARTHROSCOPY MEDIAL MENISECTOMY ABRASION CHONDROPLASTY SYNOVECTOMY SUPRAPATELLER CUFF;  Surgeon: Tobi Bastos, MD;  Location: [facility];  Service: Orthopedics;  Laterality: Left;    Social  History   Socioeconomic History  . Marital status: Widowed    Spouse name: Laverna Peace  . Number of children: 2  . Years of education: Not on file  . Highest education level: 12th grade  Occupational History  . Occupation: CNA    Comment: [employer]  . Smoking status: Never Smoker  . Smokeless tobacco: Never Used  Substance and Sexual Activity  . Alcohol use: No  . Drug use: No  . Sexual activity: Not Currently  Other Topics Concern  . Not on file  Social History Narrative  . Not on file   Social Determinants of Health   Financial Resource Strain: Medium Risk  . Difficulty of Paying Living Expenses: Somewhat hard  Food Insecurity: No Food Insecurity  . Worried About Charity fundraiser in the Last Year: Never true  . Ran Out of Food in the Last Year: Never true  Transportation Needs:   . Lack of Transportation (Medical):   Marland Kitchen Lack of Transportation (Non-Medical):   Physical Activity:   . Days of Exercise per Week:   . Minutes of Exercise per Session:   Stress: No Stress Concern Present  . Feeling of Stress : Only a little  Social Connections:   . Frequency of Communication with Friends and Family:   . Frequency of Social Gatherings with Friends and Family:   . Attends Religious Services:   . Active Member of Clubs or Organizations:   . Attends Archivist Meetings:   Marland Kitchen Marital Status:   Intimate Partner Violence:   . Fear of Current or Ex-Partner:   . Emotionally Abused:   Marland Kitchen Physically Abused:   . Sexually Abused:     Outpatient Encounter Medications as of 03/15/2020  Medication Sig  . albuterol (VENTOLIN HFA) 108 (90 Base) MCG/ACT inhaler TAKE 2 PUFFS BY MOUTH EVERY 6 HOURS AS NEEDED FOR WHEEZE OR SHORTNESS OF BREATH  . amLODipine (NORVASC) 10 MG tablet TAKE 1 TABLET BY MOUTH EVERY DAY  . aspirin 81 MG chewable tablet Chew 4 tablets by mouth daily.   . Biotin 10 MG CAPS Take 1 capsule by mouth daily.   . calcium-vitamin D (OSCAL WITH D)  500-200 MG-UNIT TABS tablet TAKE 1 TABLET BY MOUTH EVERY DAY WITH BREAKFAST  . Cholecalciferol (VITAMIN D-3 PO) Take 1 capsule by mouth daily.   Marland Kitchen denosumab (PROLIA) 60 MG/ML SOSY injection Bring to your doctor to inject 60 mg subcutaneously.  . diclofenac Sodium (VOLTAREN) 1 % GEL Apply 4 g topically 4 (four) times daily.  . diphenhydrAMINE (BENADRYL) 25 MG tablet Take 50 mg by mouth at bedtime as needed for allergies.   Marland Kitchen gabapentin (NEURONTIN) 100 MG capsule Take 2 capsules (200 mg total) by mouth 3 (three) times daily.  Marland Kitchen ketoconazole (NIZORAL) 2 % cream APPLY TO AFFECTED AREA EVERY DAY  . levothyroxine (SYNTHROID) 100 MCG tablet Take 1 Tablet by mouth once daily BEFORE BREAKFAST  . lidocaine (LIDODERM) 5 % Place 1 patch onto the skin daily. Remove & Discard patch within 12 hours  or as directed by MD  . lovastatin (MEVACOR) 40 MG tablet Take 1 tablet (40 mg total) by mouth at bedtime.  . Multiple Vitamin (MULTI-VITAMIN) tablet Take by mouth.  . Multiple Vitamins-Minerals (PRESERVISION AREDS 2 PO) Take by mouth.  . nystatin (MYCOSTATIN/NYSTOP) powder Apply topically 4 (four) times daily.  Marland Kitchen nystatin cream (MYCOSTATIN) Apply 1 application topically 2 (two) times daily.  Marland Kitchen omeprazole (PRILOSEC) 20 MG capsule Take 1 capsule (20 mg total) by mouth 2 (two) times daily before a meal.  . pyridOXINE (VITAMIN B-6) 100 MG tablet Take 100 mg by mouth daily.  . sertraline (ZOLOFT) 100 MG tablet Take 2 tablets (200 mg total) by mouth daily.  . sodium chloride (OCEAN) 0.65 % SOLN nasal spray Place 1 spray into both nostrils as needed for congestion.  . [DISCONTINUED] albuterol (VENTOLIN HFA) 108 (90 Base) MCG/ACT inhaler TAKE 2 PUFFS BY MOUTH EVERY 6 HOURS AS NEEDED FOR WHEEZE OR SHORTNESS OF BREATH  . [DISCONTINUED] gabapentin (NEURONTIN) 100 MG capsule TAKE 1 CAPSULE (100 MG TOTAL) BY MOUTH 4 (FOUR) TIMES DAILY AS NEEDED.  . [DISCONTINUED] lisinopril (ZESTRIL) 10 MG tablet Take 1 tablet (10 mg total) by  mouth daily.  Marland Kitchen lisinopril (ZESTRIL) 20 MG tablet Take 1 tablet (20 mg total) by mouth daily.  . [DISCONTINUED] ketoconazole (NIZORAL) 2 % cream APPLY TO AFFECTED AREA EVERY DAY  . [DISCONTINUED] predniSONE (DELTASONE) 20 MG tablet 2 po at sametime daily for 5 days   No facility-administered encounter medications on file as of 03/15/2020.    Allergies  Allergen Reactions  . Crestor [Rosuvastatin] Other (See Comments)    Body aches.   . Nsaids     Burns stomach.   . Penicillins Hives    Has patient had a PCN reaction causing immediate rash, facial/tongue/throat swelling, SOB or lightheadedness with hypotension: Yes Has patient had a PCN reaction causing severe rash involving mucus membranes or skin necrosis: unknown Has patient had a PCN reaction that required hospitalization No Has patient had a PCN reaction occurring within the last 10 years: No If all of the above answers are "NO", then may proceed with Cephalosporin use.   . Vytorin [Ezetimibe-Simvastatin]     Body aches.   . Tolmetin Nausea Only    Burns stomach.     Review of Systems  Constitutional: Positive for activity change and fever. Negative for appetite change, chills, diaphoresis, fatigue and unexpected weight change.  HENT: Negative.   Eyes: Negative.  Negative for photophobia and visual disturbance.  Respiratory: Positive for wheezing (intermittent). Negative for cough, chest tightness and shortness of breath.   Cardiovascular: Positive for leg swelling. Negative for chest pain and palpitations.  Gastrointestinal: Negative for abdominal distention, abdominal pain, anal bleeding, blood in stool, constipation, diarrhea, nausea, rectal pain and vomiting.  Endocrine: Negative.  Negative for cold intolerance, heat intolerance, polydipsia, polyphagia and polyuria.  Genitourinary: Positive for dysuria. Negative for decreased urine volume, difficulty urinating, flank pain, frequency, hematuria and urgency.  Musculoskeletal:  Positive for arthralgias, back pain, gait problem and joint swelling. Negative for myalgias.  Skin: Negative.   Allergic/Immunologic: Negative.   Neurological: Negative for dizziness, tremors, seizures, syncope, facial asymmetry, speech difficulty, weakness, light-headedness, numbness and headaches.  Hematological: Negative.   Psychiatric/Behavioral: Negative for confusion, hallucinations, sleep disturbance and suicidal ideas.  All other systems reviewed and are negative.       Objective:  BP (!) 148/77 (BP Location: Right Wrist, Cuff Size: Small)   Pulse 65   Temp  99 F (37.2 C)   Resp 20   Ht '5\' 3"'$  (1.6 m)   Wt 189 lb (85.7 kg)   SpO2 91%   BMI 33.48 kg/m    Wt Readings from Last 3 Encounters:  03/15/20 189 lb (85.7 kg)  01/01/20 187 lb (84.8 kg)  12/12/19 190 lb (86.2 kg)    Physical Exam Vitals and nursing note reviewed.  Constitutional:      General: She is not in acute distress.    Appearance: Normal appearance. She is well-developed and well-groomed. She is morbidly obese. She is not ill-appearing, toxic-appearing or diaphoretic.  HENT:     Head: Normocephalic and atraumatic.     Jaw: There is normal jaw occlusion.     Right Ear: Decreased hearing noted.     Left Ear: Decreased hearing noted.     Ears:     Comments: Hearing aids    Nose: Nose normal.     Mouth/Throat:     Lips: Pink.     Mouth: Mucous membranes are moist.     Pharynx: Oropharynx is clear. Uvula midline.  Eyes:     General: Lids are normal.     Extraocular Movements: Extraocular movements intact.     Conjunctiva/sclera: Conjunctivae normal.     Pupils: Pupils are equal, round, and reactive to light.  Neck:     Thyroid: No thyroid mass, thyromegaly or thyroid tenderness.     Vascular: No carotid bruit or JVD.     Trachea: Trachea and phonation normal.  Cardiovascular:     Rate and Rhythm: Normal rate and regular rhythm.     Chest Wall: PMI is not displaced.     Pulses: Normal pulses.      Heart sounds: Normal heart sounds. No murmur. No friction rub. No gallop.   Pulmonary:     Effort: Pulmonary effort is normal. No respiratory distress.     Breath sounds: Normal breath sounds. No wheezing.  Abdominal:     General: Bowel sounds are normal. There is no distension or abdominal bruit.     Palpations: Abdomen is soft. There is no hepatomegaly or splenomegaly.     Tenderness: There is no abdominal tenderness. There is no right CVA tenderness or left CVA tenderness.     Hernia: No hernia is present.  Musculoskeletal:     Cervical back: Normal range of motion and neck supple.     Thoracic back: Normal.     Lumbar back: Tenderness present. No swelling, edema, deformity, signs of trauma, lacerations, spasms or bony tenderness. Decreased range of motion. Negative right straight leg raise test and negative left straight leg raise test. No scoliosis.     Right hip: Tenderness present. No deformity, lacerations, bony tenderness or crepitus. Decreased range of motion. Normal strength.     Left hip: Normal.     Right knee: Swelling present. No deformity, effusion, erythema, ecchymosis, bony tenderness or crepitus. Decreased range of motion. No tenderness. Normal alignment, normal meniscus and normal patellar mobility.     Left knee: Swelling present. No deformity, effusion, erythema, ecchymosis, bony tenderness or crepitus. Decreased range of motion. No tenderness. Normal alignment, normal meniscus and normal patellar mobility.     Right lower leg: No deformity, lacerations, tenderness or bony tenderness. 1+ Edema present.     Left lower leg: No deformity, lacerations, tenderness or bony tenderness. 1+ Edema present.     Right ankle: Normal.     Left ankle: Normal.  Lymphadenopathy:  Cervical: No cervical adenopathy.  Skin:    General: Skin is warm and dry.     Capillary Refill: Capillary refill takes less than 2 seconds.     Coloration: Skin is not cyanotic, jaundiced or pale.      Findings: No rash.  Neurological:     General: No focal deficit present.     Mental Status: She is alert and oriented to person, place, and time.     Cranial Nerves: Cranial nerves are intact. No cranial nerve deficit.     Sensory: Sensation is intact. No sensory deficit.     Motor: Motor function is intact. No weakness.     Coordination: Coordination is intact. Coordination normal.     Gait: Gait abnormal (antalgic, uses cane).     Deep Tendon Reflexes: Reflexes are normal and symmetric. Reflexes normal.  Psychiatric:        Attention and Perception: Attention and perception normal.        Mood and Affect: Mood and affect normal.        Speech: Speech normal.        Behavior: Behavior normal. Behavior is cooperative.        Thought Content: Thought content normal.        Cognition and Memory: Cognition and memory normal.        Judgment: Judgment normal.     Results for orders placed or performed in visit on 12/12/19  Urine culture   Specimen: Urine   URINE  Result Value Ref Range   Urine Culture, Routine Final report    Organism ID, Bacteria Comment   Microscopic Examination   URINE  Result Value Ref Range   WBC, UA 6-10 (A) 0 - 5 /hpf   RBC 3-10 (A) 0 - 2 /hpf   Epithelial Cells (non renal) 0-10 0 - 10 /hpf   Renal Epithel, UA None seen None seen /hpf   Bacteria, UA Few None seen/Few  CMP14+EGFR  Result Value Ref Range   Glucose 94 65 - 99 mg/dL   BUN 14 8 - 27 mg/dL   Creatinine, Ser 0.61 0.57 - 1.00 mg/dL   GFR calc non Af Amer 87 >59 mL/min/1.73   GFR calc Af Amer 100 >59 mL/min/1.73   BUN/Creatinine Ratio 23 12 - 28   Sodium 138 134 - 144 mmol/L   Potassium 4.2 3.5 - 5.2 mmol/L   Chloride 98 96 - 106 mmol/L   CO2 28 20 - 29 mmol/L   Calcium 10.1 8.7 - 10.3 mg/dL   Total Protein 7.3 6.0 - 8.5 g/dL   Albumin 4.7 3.7 - 4.7 g/dL   Globulin, Total 2.6 1.5 - 4.5 g/dL   Albumin/Globulin Ratio 1.8 1.2 - 2.2   Bilirubin Total 0.3 0.0 - 1.2 mg/dL   Alkaline  Phosphatase 78 39 - 117 IU/L   AST 26 0 - 40 IU/L   ALT 23 0 - 32 IU/L  CBC with Differential/Platelet  Result Value Ref Range   WBC 7.1 3.4 - 10.8 x10E3/uL   RBC 4.72 3.77 - 5.28 x10E6/uL   Hemoglobin 14.9 11.1 - 15.9 g/dL   Hematocrit 44.1 34.0 - 46.6 %   MCV 93 79 - 97 fL   MCH 31.6 26.6 - 33.0 pg   MCHC 33.8 31.5 - 35.7 g/dL   RDW 11.7 11.7 - 15.4 %   Platelets 221 150 - 450 x10E3/uL   Neutrophils 58 Not Estab. %   Lymphs 32 Not Estab. %  Monocytes 8 Not Estab. %   Eos 1 Not Estab. %   Basos 1 Not Estab. %   Neutrophils Absolute 4.1 1.4 - 7.0 x10E3/uL   Lymphocytes Absolute 2.3 0.7 - 3.1 x10E3/uL   Monocytes Absolute 0.5 0.1 - 0.9 x10E3/uL   EOS (ABSOLUTE) 0.1 0.0 - 0.4 x10E3/uL   Basophils Absolute 0.0 0.0 - 0.2 x10E3/uL   Immature Granulocytes 0 Not Estab. %   Immature Grans (Abs) 0.0 0.0 - 0.1 x10E3/uL  Lipid panel  Result Value Ref Range   Cholesterol, Total 190 100 - 199 mg/dL   Triglycerides 345 (H) 0 - 149 mg/dL   HDL 48 >39 mg/dL   VLDL Cholesterol Cal 56 (H) 5 - 40 mg/dL   LDL Chol Calc (NIH) 86 0 - 99 mg/dL   Chol/HDL Ratio 4.0 0.0 - 4.4 ratio  Thyroid Panel With TSH  Result Value Ref Range   TSH 1.570 0.450 - 4.500 uIU/mL   T4, Total 7.7 4.5 - 12.0 ug/dL   T3 Uptake Ratio 28 24 - 39 %   Free Thyroxine Index 2.2 1.2 - 4.9  Vitamin D 25 hydroxy  Result Value Ref Range   Vit D, 25-Hydroxy 32.4 30.0 - 100.0 ng/mL  Microalbumin / creatinine urine ratio  Result Value Ref Range   Creatinine, Urine 61.0 Not Estab. mg/dL   Microalbumin, Urine 19.6 Not Estab. ug/mL   Microalb/Creat Ratio 32 (H) 0 - 29 mg/g creat  [id number]  Result Value Ref Range   HB A1C (BAYER DCA - WAIVED) 6.2 <7.0 %  urinalysis- dip and micro  Result Value Ref Range   Specific Gravity, UA 1.025 1.005 - 1.030   pH, UA 5.5 5.0 - 7.5   Color, UA Yellow Yellow   Appearance Ur Clear Clear   Leukocytes,UA 1+ (A) Negative   Protein,UA Negative Negative/Trace   Glucose, UA Negative Negative     Ketones, UA Negative Negative   RBC, UA Trace (A) Negative   Bilirubin, UA Negative Negative   Urobilinogen, Ur 0.2 0.2 - 1.0 mg/dL   Nitrite, UA Negative Negative   Microscopic Examination See below:        Pertinent labs & imaging results that were available during my care of the patient were reviewed by me and considered in my medical decision making.  Assessment & Plan:  Cindie was seen today for medical management of chronic issues, hypertension and hypothyroidism.  Diagnoses and all orders for this visit:  Type 2 diabetes mellitus with hypercholesterolemia (HCC) A1C 6.5. Diet and exercise encouraged. Pt aware if this increases, medications will need to be initiated.  -     CMP14+EGFR -     Microalbumin / creatinine urine ratio -     Bayer DCA Hb A1c Waived  Acquired hypothyroidism Thyroid disease has been well controlled. Labs are pending. Adjustments to regimen will be made if warranted. Make sure to take medications on an empty stomach with a full glass of water. Make sure to avoid vitamins or supplements for at least 4 hours before and 4 hours after taking medications. Repeat labs in 3 months if adjustments are made and in 6 months if stable.   -     Thyroid Panel With TSH  Hyperlipidemia associated with type 2 diabetes mellitus ([place]) Diet encouraged - increase intake of fresh fruits and vegetables, increase intake of lean proteins. Bake, broil, or grill foods. Avoid fried, greasy, and fatty foods. Avoid fast foods. Increase intake of fiber-rich whole grains. Exercise  encouraged - at least 150 minutes per week and advance as tolerated.  Goal BMI < 25. Continue medications as prescribed. Follow up in 3-6 months as discussed.  -     CMP14+EGFR -     Lipid panel  Hypertension associated with type 2 diabetes mellitus (HCC) BP fairly controlled. Changes were made in regimen today, increased Lisinopril to 20 mg daily.. Goal BP is 130/80. Pt aware to report any persistent high or  low readings. DASH diet and exercise encouraged. Exercise at least 150 minutes per week and increase as tolerated. Goal BMI > 25. Stress management encouraged. Avoid nicotine and tobacco product use. Avoid excessive alcohol and NSAID's. Avoid more than 2000 mg of sodium daily. Medications as prescribed. Follow up as scheduled.  -     CBC with Differential/Platelet -     CMP14+EGFR -     Lipid panel -     Thyroid Panel With TSH -     Microalbumin / creatinine urine ratio -     lisinopril (ZESTRIL) 20 MG tablet; Take 1 tablet (20 mg total) by mouth daily.  Morbid obesity ([place]) Diet and exercise encouraged. Labs pending.  -     CBC with Differential/Platelet -     CMP14+EGFR -     Lipid panel -     Thyroid Panel With TSH  Vitamin D deficiency Labs pending. Continue repletion therapy. If indicated, will change repletion dosage. Eat foods rich in Vit D including milk, orange juice, yogurt with vitamin D added, salmon or mackerel, canned tuna fish, cereals with vitamin D added, and cod liver oil. Get out in the sun but make sure to wear at least SPF 30 sunscreen.  -     VITAMIN D 25 Hydroxy (Vit-D Deficiency, Fractures)  Dysuria Urinalysis unremarkable in office. Culture pending, will initiate therapy if warranted.  -     Urinalysis, Complete -     Urine Culture  Chronic pain of multiple joints Ongoing and worsening. Will increase gabapentin to 200 mg three times daily to see if beneficial. Referral to ortho due to ongoing and worsening symptoms.  -     gabapentin (NEURONTIN) 100 MG capsule; Take 2 capsules (200 mg total) by mouth 3 (three) times daily. -     Ambulatory referral to Orthopedic Surgery  Wheezing Intermittent. Will refill below. Pt aware to report any new, worsening, or persistent symptoms.  -     albuterol (VENTOLIN HFA) 108 (90 Base) MCG/ACT inhaler; TAKE 2 PUFFS BY MOUTH EVERY 6 HOURS AS NEEDED FOR WHEEZE OR SHORTNESS OF BREATH     Continue all other maintenance  medications.  Follow up plan: Return in about 4 weeks (around 04/12/2020), or if symptoms worsen or fail to improve, for pain.   Medical decision-making:  30 minutes spent today reviewing the medical chart, counseling the patient/family, and documenting today's visit.  Continue healthy lifestyle choices, including diet (rich in fruits, vegetables, and lean proteins, and low in salt and simple carbohydrates) and exercise (at least 30 minutes of moderate physical activity daily).  Educational handout given for health maintenance  The above assessment and management plan was discussed with the patient. The patient verbalized understanding of and has agreed to the management plan. Patient is aware to call the clinic if they develop any new symptoms or if symptoms persist or worsen. Patient is aware when to return to the clinic for a follow-up visit. Patient educated on when it is appropriate to go to the emergency  department.   Monia Pouch, FNP-C [facility] [phone]

## 2020-03-15 NOTE — Patient Instructions (Signed)
Health Maintenance After Age 80 After age 80, you are at a higher risk for certain long-term diseases and infections as well as injuries from falls. Falls are a major cause of broken bones and head injuries in people who are older than age 80. Getting regular preventive care can help to keep you healthy and well. Preventive care includes getting regular testing and making lifestyle changes as recommended by your health care provider. Talk with your health care provider about:  Which screenings and tests you should have. A screening is a test that checks for a disease when you have no symptoms.  A diet and exercise plan that is right for you. What should I know about screenings and tests to prevent falls? Screening and testing are the best ways to find a health problem early. Early diagnosis and treatment give you the best chance of managing medical conditions that are common after age 80. Certain conditions and lifestyle choices may make you more likely to have a fall. Your health care provider may recommend:  Regular vision checks. Poor vision and conditions such as cataracts can make you more likely to have a fall. If you wear glasses, make sure to get your prescription updated if your vision changes.  Medicine review. Work with your health care provider to regularly review all of the medicines you are taking, including over-the-counter medicines. Ask your health care provider about any side effects that may make you more likely to have a fall. Tell your health care provider if any medicines that you take make you feel dizzy or sleepy.  Osteoporosis screening. Osteoporosis is a condition that causes the bones to get weaker. This can make the bones weak and cause them to break more easily.  Blood pressure screening. Blood pressure changes and medicines to control blood pressure can make you feel dizzy.  Strength and balance checks. Your health care provider may recommend certain tests to check your  strength and balance while standing, walking, or changing positions.  Foot health exam. Foot pain and numbness, as well as not wearing proper footwear, can make you more likely to have a fall.  Depression screening. You may be more likely to have a fall if you have a fear of falling, feel emotionally low, or feel unable to do activities that you used to do.  Alcohol use screening. Using too much alcohol can affect your balance and may make you more likely to have a fall. What actions can I take to lower my risk of falls? General instructions  Talk with your health care provider about your risks for falling. Tell your health care provider if: ? You fall. Be sure to tell your health care provider about all falls, even ones that seem minor. ? You feel dizzy, sleepy, or off-balance.  Take over-the-counter and prescription medicines only as told by your health care provider. These include any supplements.  Eat a healthy diet and maintain a healthy weight. A healthy diet includes low-fat dairy products, low-fat (lean) meats, and fiber from whole grains, beans, and lots of fruits and vegetables. Home safety  Remove any tripping hazards, such as rugs, cords, and clutter.  Install safety equipment such as grab bars in bathrooms and safety rails on stairs.  Keep rooms and walkways well-lit. Activity   Follow a regular exercise program to stay fit. This will help you maintain your balance. Ask your health care provider what types of exercise are appropriate for you.  If you need a cane or   walker, use it as recommended by your health care provider.  Wear supportive shoes that have nonskid soles. Lifestyle  Do not drink alcohol if your health care provider tells you not to drink.  If you drink alcohol, limit how much you have: ? 0-1 drink a day for women. ? 0-2 drinks a day for men.  Be aware of how much alcohol is in your drink. In the [place] one drink equals one typical bottle of beer (12  oz), one-half glass of wine (5 oz), or one shot of hard liquor (1 oz).  Do not use any products that contain nicotine or tobacco, such as cigarettes and e-cigarettes. If you need help quitting, ask your health care provider. Summary  Having a healthy lifestyle and getting preventive care can help to protect your health and wellness after age 80.  Screening and testing are the best way to find a health problem early and help you avoid having a fall. Early diagnosis and treatment give you the best chance for managing medical conditions that are more common for people who are older than age 80.  Falls are a major cause of broken bones and head injuries in people who are older than age 80. Take precautions to prevent a fall at home.  Work with your health care provider to learn what changes you can make to improve your health and wellness and to prevent falls. This information is not intended to replace advice given to you by your health care provider. Make sure you discuss any questions you have with your health care provider. Document Revised: 04/03/2019 Document Reviewed: 10/24/2017 Elsevier Patient Education  2020 [employer].  

## 2020-03-16 ENCOUNTER — Telehealth: Payer: Medicare Other

## 2020-03-16 LAB — CMP14+EGFR
ALT: 20 IU/L (ref 0–32)
AST: 18 IU/L (ref 0–40)
Albumin/Globulin Ratio: 2 (ref 1.2–2.2)
Albumin: 4.5 g/dL (ref 3.7–4.7)
Alkaline Phosphatase: 68 IU/L (ref 39–117)
BUN/Creatinine Ratio: 20 (ref 12–28)
BUN: 12 mg/dL (ref 8–27)
Bilirubin Total: 0.3 mg/dL (ref 0.0–1.2)
CO2: 27 mmol/L (ref 20–29)
Calcium: 10.2 mg/dL (ref 8.7–10.3)
Chloride: 100 mmol/L (ref 96–106)
Creatinine, Ser: 0.61 mg/dL (ref 0.57–1.00)
GFR calc Af Amer: 100 mL/min/{1.73_m2} (ref >59)
GFR calc non Af Amer: 87 mL/min/{1.73_m2} (ref >59)
Globulin, Total: 2.2 g/dL (ref 1.5–4.5)
Glucose: 81 mg/dL (ref 65–99)
Potassium: 4.3 mmol/L (ref 3.5–5.2)
Sodium: 139 mmol/L (ref 134–144)
Total Protein: 6.7 g/dL (ref 6.0–8.5)

## 2020-03-16 LAB — CBC WITH DIFFERENTIAL/PLATELET
Basophils Absolute: 0 10*3/uL (ref 0.0–0.2)
Basos: 1 %
EOS (ABSOLUTE): 0.1 10*3/uL (ref 0.0–0.4)
Eos: 1 %
Hematocrit: 43.8 % (ref 34.0–46.6)
Hemoglobin: 14.5 g/dL (ref 11.1–15.9)
Immature Grans (Abs): 0 10*3/uL (ref 0.0–0.1)
Immature Granulocytes: 0 %
Lymphocytes Absolute: 2 10*3/uL (ref 0.7–3.1)
Lymphs: 39 %
MCH: 31.5 pg (ref 26.6–33.0)
MCHC: 33.1 g/dL (ref 31.5–35.7)
MCV: 95 fL (ref 79–97)
Monocytes Absolute: 0.5 10*3/uL (ref 0.1–0.9)
Monocytes: 10 %
Neutrophils Absolute: 2.7 10*3/uL (ref 1.4–7.0)
Neutrophils: 49 %
Platelets: 203 10*3/uL (ref 150–450)
RBC: 4.6 x10E6/uL (ref 3.77–5.28)
RDW: 11.8 % (ref 11.7–15.4)
WBC: 5.3 10*3/uL (ref 3.4–10.8)

## 2020-03-16 LAB — LIPID PANEL
Chol/HDL Ratio: 3.8 ratio (ref 0.0–4.4)
Cholesterol, Total: 168 mg/dL (ref 100–199)
HDL: 44 mg/dL (ref >39)
LDL Chol Calc (NIH): 75 mg/dL (ref 0–99)
Triglycerides: 304 mg/dL — ABNORMAL HIGH (ref 0–149)
VLDL Cholesterol Cal: 49 mg/dL — ABNORMAL HIGH (ref 5–40)

## 2020-03-16 LAB — THYROID PANEL WITH TSH
Free Thyroxine Index: 1.8 (ref 1.2–4.9)
T3 Uptake Ratio: 25 % (ref 24–39)
T4, Total: 7.1 ug/dL (ref 4.5–12.0)
TSH: 1.74 u[IU]/mL (ref 0.450–4.500)

## 2020-03-16 LAB — VITAMIN D 25 HYDROXY (VIT D DEFICIENCY, FRACTURES): Vit D, 25-Hydroxy: 62 ng/mL (ref 30.0–100.0)

## 2020-03-16 LAB — MICROALBUMIN / CREATININE URINE RATIO
Creatinine, Urine: 24.4 mg/dL
Microalb/Creat Ratio: 34 mg/g creat — ABNORMAL HIGH (ref 0–29)
Microalbumin, Urine: 8.2 ug/mL

## 2020-03-17 LAB — URINE CULTURE

## 2020-03-17 MED ORDER — CEPHALEXIN 500 MG PO CAPS: 500.0000 mg | ORAL_CAPSULE | Freq: Four times a day (QID) | ORAL | 0 refills | Status: AC

## 2020-03-17 NOTE — Addendum Note (Signed)
Addended by: Baruch Gouty on: 03/17/2020 08:56 PM   Modules accepted: Orders

## 2020-03-18 ENCOUNTER — Other Ambulatory Visit: Payer: Self-pay | Admitting: Family Medicine

## 2020-03-18 ENCOUNTER — Other Ambulatory Visit: Payer: Self-pay

## 2020-03-18 DIAGNOSIS — I152 Hypertension secondary to endocrine disorders: Secondary | ICD-10-CM

## 2020-03-18 DIAGNOSIS — E1159 Type 2 diabetes mellitus with other circulatory complications: Secondary | ICD-10-CM

## 2020-03-18 DIAGNOSIS — Z87440 Personal history of urinary (tract) infections: Secondary | ICD-10-CM

## 2020-03-23 DIAGNOSIS — M79672 Pain in left foot: Secondary | ICD-10-CM | POA: Diagnosis not present

## 2020-03-23 DIAGNOSIS — M779 Enthesopathy, unspecified: Secondary | ICD-10-CM | POA: Diagnosis not present

## 2020-03-28 ENCOUNTER — Other Ambulatory Visit: Payer: Self-pay | Admitting: Family Medicine

## 2020-03-28 DIAGNOSIS — K219 Gastro-esophageal reflux disease without esophagitis: Secondary | ICD-10-CM

## 2020-03-30 DIAGNOSIS — M25562 Pain in left knee: Secondary | ICD-10-CM | POA: Diagnosis not present

## 2020-03-30 DIAGNOSIS — M170 Bilateral primary osteoarthritis of knee: Secondary | ICD-10-CM | POA: Diagnosis not present

## 2020-03-30 DIAGNOSIS — M25561 Pain in right knee: Secondary | ICD-10-CM | POA: Diagnosis not present

## 2020-03-30 DIAGNOSIS — M1712 Unilateral primary osteoarthritis, left knee: Secondary | ICD-10-CM | POA: Diagnosis not present

## 2020-04-02 ENCOUNTER — Other Ambulatory Visit: Payer: Self-pay | Admitting: Family Medicine

## 2020-04-02 ENCOUNTER — Telehealth: Payer: Self-pay | Admitting: Family Medicine

## 2020-04-02 MED ORDER — FLUCONAZOLE 150 MG PO TABS: 150.0000 mg | ORAL_TABLET | ORAL | 0 refills | Status: DC | PRN

## 2020-04-02 NOTE — Telephone Encounter (Signed)
Diflucan Prescription sent to pharmacy   

## 2020-04-02 NOTE — Telephone Encounter (Signed)
  Incoming Patient Call  04/02/2020  What symptoms do you have? Burning  When she pees. Has a yeast infection from ABX for UTI  How long have you been sick? 4-5 days  Have you been seen for this problem? NO  If your provider decides to give you a prescription, which [facility]   Patient informed that this information will be sent to the clinical staff for review and that they should receive a follow up call.

## 2020-04-02 NOTE — Telephone Encounter (Signed)
Patient aware.

## 2020-04-05 ENCOUNTER — Ambulatory Visit (INDEPENDENT_AMBULATORY_CARE_PROVIDER_SITE_OTHER): Payer: Medicare Other | Admitting: Family Medicine

## 2020-04-05 ENCOUNTER — Other Ambulatory Visit: Payer: Self-pay

## 2020-04-05 ENCOUNTER — Encounter: Payer: Self-pay | Admitting: Family Medicine

## 2020-04-05 VITALS — BP 134/67 | HR 59 | Temp 97.5°F | Ht 63.0 in | Wt 185.5 lb

## 2020-04-05 DIAGNOSIS — Z87440 Personal history of urinary (tract) infections: Secondary | ICD-10-CM

## 2020-04-05 LAB — URINALYSIS, COMPLETE
Bilirubin, UA: NEGATIVE
Glucose, UA: NEGATIVE
Ketones, UA: NEGATIVE
Nitrite, UA: NEGATIVE
Protein,UA: NEGATIVE
RBC, UA: NEGATIVE
Specific Gravity, UA: 1.015 (ref 1.005–1.030)
Urobilinogen, Ur: 0.2 mg/dL (ref 0.2–1.0)
pH, UA: 6 (ref 5.0–7.5)

## 2020-04-05 LAB — MICROSCOPIC EXAMINATION
Bacteria, UA: NONE SEEN
RBC, Urine: NONE SEEN /hpf (ref 0–2)
Renal Epithel, UA: NONE SEEN /hpf

## 2020-04-05 NOTE — Progress Notes (Signed)
BP 134/67   Pulse (!) 59   Temp (!) 97.5 F (36.4 C) (Temporal)   Ht 5\' 3"  (1.6 m)   Wt 185 lb 8 oz (84.1 kg)   SpO2 96%   BMI 32.86 kg/m    Subjective:   Patient ID: Danielle Parrish, female    DOB: 1941-02-14, 79 y.o.   MRN: [id number]  HPI: Kristyna Abdella is a 79 y.o. female presenting on 04/05/2020 for Medical Management of Chronic Issues (4 week recheck - was [facility] pt)   HPI Patient is coming in for recheck for UTIs.  She has a history of recurrent UTIs very frequently.  She was just diagnosed with one a couple weeks ago and treated with antibiotics and finished the full course.  She denies any symptoms today but is just coming in for recheck.  Patient denies any fevers or chills or flank pain.  Relevant past medical, surgical, family and social history reviewed and updated as indicated. Interim medical history since our last visit reviewed. Allergies and medications reviewed and updated.  Review of Systems  Constitutional: Negative for chills and fever.  Respiratory: Negative for chest tightness and shortness of breath.   Cardiovascular: Negative for chest pain and leg swelling.  Gastrointestinal: Negative for abdominal pain.  Genitourinary: Positive for dysuria, frequency and urgency. Negative for difficulty urinating, hematuria, vaginal bleeding, vaginal discharge and vaginal pain.  Psychiatric/Behavioral: Negative for agitation and behavioral problems.  All other systems reviewed and are negative.   Per HPI unless specifically indicated above   Allergies as of 04/05/2020      Reactions   Crestor [rosuvastatin] Other (See Comments)   Body aches.    Nsaids    Burns stomach.    Penicillins Hives   Has patient had a PCN reaction causing immediate rash, facial/tongue/throat swelling, SOB or lightheadedness with hypotension: Yes Has patient had a PCN reaction causing severe rash involving mucus membranes or skin necrosis: unknown Has patient had a PCN reaction  that required hospitalization No Has patient had a PCN reaction occurring within the last 10 years: No If all of the above answers are "NO", then may proceed with Cephalosporin use.   Vytorin [ezetimibe-simvastatin]    Body aches.    Tolmetin Nausea Only   Burns stomach.       Medication List       Accurate as of April 05, 2020 12:12 PM. If you have any questions, ask your nurse or doctor.        STOP taking these medications   fluconazole 150 MG tablet Commonly known as: DIFLUCAN Stopped by: Fransisca Kaufmann Jaxxson Cavanah, MD     TAKE these medications   albuterol 108 (90 Base) MCG/ACT inhaler Commonly known as: VENTOLIN HFA TAKE 2 PUFFS BY MOUTH EVERY 6 HOURS AS NEEDED FOR WHEEZE OR SHORTNESS OF BREATH   amLODipine 10 MG tablet Commonly known as: NORVASC TAKE 1 TABLET BY MOUTH EVERY DAY   aspirin 81 MG chewable tablet Chew 4 tablets by mouth daily.   Biotin 10 MG Caps Take 1 capsule by mouth daily.   calcium-vitamin D 500-200 MG-UNIT Tabs tablet Commonly known as: OSCAL WITH D TAKE 1 TABLET BY MOUTH EVERY DAY WITH BREAKFAST   denosumab 60 MG/ML Sosy injection Commonly known as: PROLIA Bring to your doctor to inject 60 mg subcutaneously.   diclofenac Sodium 1 % Gel Commonly known as: Voltaren Apply 4 g topically 4 (four) times daily.   diphenhydrAMINE 25 MG tablet Commonly known  as: BENADRYL Take 50 mg by mouth at bedtime as needed for allergies.   gabapentin 100 MG capsule Commonly known as: NEURONTIN Take 2 capsules (200 mg total) by mouth 3 (three) times daily.   ketoconazole 2 % cream Commonly known as: NIZORAL APPLY TO AFFECTED AREA EVERY DAY   levothyroxine 100 MCG tablet Commonly known as: SYNTHROID Take 1 Tablet by mouth once daily BEFORE BREAKFAST   lidocaine 5 % Commonly known as: Lidoderm Place 1 patch onto the skin daily. Remove & Discard patch within 12 hours or as directed by MD   lisinopril 20 MG tablet Commonly known as: ZESTRIL Take 1  tablet (20 mg total) by mouth daily.   lovastatin 40 MG tablet Commonly known as: MEVACOR Take 1 tablet (40 mg total) by mouth at bedtime.   Multi-Vitamin tablet Take by mouth.   nystatin powder Commonly known as: MYCOSTATIN/NYSTOP Apply topically 4 (four) times daily.   nystatin cream Commonly known as: MYCOSTATIN Apply 1 application topically 2 (two) times daily.   omeprazole 20 MG capsule Commonly known as: PRILOSEC TAKE 1 CAPSULE (20 MG TOTAL) BY MOUTH 2 (TWO) TIMES DAILY BEFORE A MEAL.   PRESERVISION AREDS 2 PO Take by mouth.   pyridOXINE 100 MG tablet Commonly known as: VITAMIN B-6 Take 100 mg by mouth daily.   sertraline 100 MG tablet Commonly known as: ZOLOFT Take 2 tablets (200 mg total) by mouth daily.   sodium chloride 0.65 % Soln nasal spray Commonly known as: OCEAN Place 1 spray into both nostrils as needed for congestion.   VITAMIN D-3 PO Take 1 capsule by mouth daily.        Objective:   BP 134/67   Pulse (!) 59   Temp (!) 97.5 F (36.4 C) (Temporal)   Ht 5\' 3"  (1.6 m)   Wt 185 lb 8 oz (84.1 kg)   SpO2 96%   BMI 32.86 kg/m   Wt Readings from Last 3 Encounters:  04/05/20 185 lb 8 oz (84.1 kg)  03/15/20 189 lb (85.7 kg)  01/01/20 187 lb (84.8 kg)    Physical Exam Vitals and nursing note reviewed.  Constitutional:      General: She is not in acute distress.    Appearance: She is well-developed. She is not diaphoretic.  Eyes:     Conjunctiva/sclera: Conjunctivae normal.  Abdominal:     General: There is no distension.     Tenderness: There is no abdominal tenderness. There is no right CVA tenderness, left CVA tenderness, guarding or rebound.  Skin:    General: Skin is warm and dry.     Findings: No rash.  Neurological:     Mental Status: She is alert and oriented to person, place, and time.     Coordination: Coordination normal.  Psychiatric:        Behavior: Behavior normal.     Urinalysis, trace leukocytes, 0-5 WBCs, 0-10  epithelial cells  Assessment & Plan:   Problem List Items Addressed This Visit    None    Visit Diagnoses    History of recurrent UTIs    -  Primary   History of UTI           Follow up plan: Return in about 2 months (around 06/05/2020), or if symptoms worsen or fail to improve, for Follow-up chronic medical recheck hypertension and thyroid.  Counseling provided for all of the vaccine components No orders of the defined types were placed in this encounter.  Caryl Pina, MD [facility] 04/05/2020, 12:12 PM

## 2020-04-06 NOTE — Progress Notes (Signed)
Lmtcb.

## 2020-04-07 ENCOUNTER — Telehealth: Payer: Self-pay | Admitting: Family Medicine

## 2020-04-07 MED ORDER — DENOSUMAB 60 MG/ML ~~LOC~~ SOSY: PREFILLED_SYRINGE | SUBCUTANEOUS | 0 refills | Status: DC

## 2020-04-07 NOTE — Telephone Encounter (Signed)
  Prescription Request  04/07/2020  What is the name of the medication or equipment? Prolia  Have you contacted your pharmacy to request a refill? (if applicable) Yes  Which [facility] would you [facility] requesting refill on Prolia Rx for patient. Pt was last seen on 04/05/20.   Patient notified that their request is being sent to the clinical staff for review and that they should receive a response within 2 business days.

## 2020-04-07 NOTE — Telephone Encounter (Signed)
Sent!

## 2020-04-13 ENCOUNTER — Telehealth: Payer: Self-pay | Admitting: Family Medicine

## 2020-04-13 NOTE — Telephone Encounter (Signed)
Spoke with patient and advised last Prolia shot was on 11/07/2019.  Informed patient we will be submitted this for benefit verification from her insurance and we will contact her to schedule an appointment for 05/07/2020 or after.  Patient voices understanding.

## 2020-04-14 ENCOUNTER — Telehealth: Payer: Self-pay | Admitting: Neurology

## 2020-04-14 DIAGNOSIS — Z9989 Dependence on other enabling machines and devices: Secondary | ICD-10-CM

## 2020-04-14 DIAGNOSIS — G4733 Obstructive sleep apnea (adult) (pediatric): Secondary | ICD-10-CM

## 2020-04-14 NOTE — Telephone Encounter (Signed)
Pt last visit was was in Jan of 2021 order has been placed and faxed to [facility].

## 2020-04-14 NOTE — Telephone Encounter (Signed)
Pt states she needs a new prescription for her cpap supplies sent to [facility] fax#[phone]

## 2020-05-07 ENCOUNTER — Ambulatory Visit (INDEPENDENT_AMBULATORY_CARE_PROVIDER_SITE_OTHER): Payer: Medicare Other | Admitting: *Deleted

## 2020-05-07 ENCOUNTER — Other Ambulatory Visit: Payer: Self-pay

## 2020-05-07 ENCOUNTER — Telehealth: Payer: Self-pay | Admitting: Family Medicine

## 2020-05-07 DIAGNOSIS — N3090 Cystitis, unspecified without hematuria: Secondary | ICD-10-CM

## 2020-05-07 DIAGNOSIS — M8008XS Age-related osteoporosis with current pathological fracture, vertebra(e), sequela: Secondary | ICD-10-CM | POA: Diagnosis not present

## 2020-05-07 LAB — MICROSCOPIC EXAMINATION
RBC, Urine: NONE SEEN /hpf (ref 0–2)
Renal Epithel, UA: NONE SEEN /hpf

## 2020-05-07 LAB — URINALYSIS, COMPLETE
Bilirubin, UA: NEGATIVE
Glucose, UA: NEGATIVE
Ketones, UA: NEGATIVE
Nitrite, UA: NEGATIVE
Protein,UA: NEGATIVE
RBC, UA: NEGATIVE
Specific Gravity, UA: 1.02 (ref 1.005–1.030)
Urobilinogen, Ur: 0.2 mg/dL (ref 0.2–1.0)
pH, UA: 5.5 (ref 5.0–7.5)

## 2020-05-07 MED ORDER — DENOSUMAB 60 MG/ML ~~LOC~~ SOSY
60.0000 mg | PREFILLED_SYRINGE | Freq: Once | SUBCUTANEOUS | Status: AC
  Administered 2020-05-07: 60 mg via SUBCUTANEOUS

## 2020-05-07 NOTE — Progress Notes (Signed)
Patient in today for Prolia injection. 60 mg given SQ right arm. Patient tolerated well. Patient also complains of cystitis. Urine sample left for testing.

## 2020-05-10 ENCOUNTER — Telehealth: Payer: Self-pay | Admitting: Family Medicine

## 2020-05-10 NOTE — Telephone Encounter (Signed)
Please review urine and advise.

## 2020-05-10 NOTE — Telephone Encounter (Signed)
NA

## 2020-05-10 NOTE — Telephone Encounter (Signed)
Please let the patient know that her urine did not necessarily grow anything and did not show anything major, if she continues to have symptoms then please let us know.  If she continues to have symptoms they come in for a visit.

## 2020-05-13 ENCOUNTER — Telehealth: Payer: Self-pay | Admitting: Family Medicine

## 2020-05-13 NOTE — Telephone Encounter (Signed)
Per lab notes appt made

## 2020-05-13 NOTE — Telephone Encounter (Signed)
Per lab results appt made with J

## 2020-05-14 ENCOUNTER — Other Ambulatory Visit: Payer: Self-pay

## 2020-05-14 ENCOUNTER — Encounter: Payer: Self-pay | Admitting: Nurse Practitioner

## 2020-05-14 ENCOUNTER — Ambulatory Visit (INDEPENDENT_AMBULATORY_CARE_PROVIDER_SITE_OTHER): Payer: Medicare Other | Admitting: Nurse Practitioner

## 2020-05-14 VITALS — BP 130/59 | HR 54 | Temp 98.3°F | Resp 20 | Ht 63.0 in | Wt 192.0 lb

## 2020-05-14 DIAGNOSIS — G8929 Other chronic pain: Secondary | ICD-10-CM | POA: Diagnosis not present

## 2020-05-14 DIAGNOSIS — M545 Low back pain, unspecified: Secondary | ICD-10-CM

## 2020-05-14 MED ORDER — PREDNISONE 10 MG (21) PO TBPK: ORAL_TABLET | ORAL | 0 refills | Status: DC

## 2020-05-14 MED ORDER — ACETAMINOPHEN 500 MG PO TABS: 500.0000 mg | ORAL_TABLET | Freq: Four times a day (QID) | ORAL | 0 refills | Status: DC | PRN

## 2020-05-14 MED ORDER — CYCLOBENZAPRINE HCL 5 MG PO TABS: 5.0000 mg | ORAL_TABLET | Freq: Three times a day (TID) | ORAL | 1 refills | Status: DC | PRN

## 2020-05-14 NOTE — Assessment & Plan Note (Signed)
Back pain not well controlled, patient has tried different treatments with only mild to moderate effect. patient is unable to use NSAID. Started patient on prednisone pack to help with inflammation, flexeril as needed  to help relax back muscles and tylenol for aches and pain. Provided education to patient with printed handout. Patient knows to follow up with worsening or unresolved symptoms.  Rx sent to pharmacy.

## 2020-05-14 NOTE — Progress Notes (Signed)
Acute Office Visit  Subjective:    Patient ID: Danielle Parrish, female    DOB: [birth year], 79 y.o.   MRN: [id number]  Chief Complaint  Patient presents with  . Back Pain    right side lower back     Back Pain This is a chronic problem. The pain is at a severity of 8/10. The pain is severe. The pain is the same all the time. The symptoms are aggravated by bending, position, standing and lying down. Stiffness is present all day. Pertinent negatives include no abdominal pain, chest pain, fever or headaches. She has tried heat for the symptoms. The treatment provided no relief.    Past Medical History:  Diagnosis Date  . Anxiety   . Arthritis   . CHF (congestive heart failure) ([place])   . Chronic pain of multiple joints   . Depression   . Essential hypertension   . GERD (gastroesophageal reflux disease)   . Hyperlipidemia   . Hyperlipidemia associated with type 2 diabetes mellitus ([place])   . Hypertension associated with type 2 diabetes mellitus ([place])   . Hypothyroidism   . Osteoporosis   . Sleep apnea    CPAP  . Type 2 diabetes mellitus with hypercholesterolemia ([place])   . Vitamin D deficiency     Past Surgical History:  Procedure Laterality Date  . ABDOMINAL HERNIA REPAIR    . ABDOMINAL HYSTERECTOMY  1977  . BREAST EXCISIONAL BIOPSY Right   . EYE SURGERY  2020   bilateral cataract removal   . KNEE ARTHROSCOPY Left 09/30/2014   Procedure: LEFT KNEE ARTHROSCOPY MEDIAL MENISECTOMY ABRASION CHONDROPLASTY SYNOVECTOMY SUPRAPATELLER CUFF;  Surgeon: Tobi Bastos, MD;  Location: [facility];  Service: Orthopedics;  Laterality: Left;    Family History  Problem Relation Age of Onset  . Hypertension Mother   . Stroke Mother   . Cancer Father     Social History   Socioeconomic History  . Marital status: Widowed    Spouse name: Laverna Peace  . Number of children: 2  . Years of education: Not on file  . Highest education level: 12th grade  Occupational History  . Occupation: CNA      Comment: [employer]  . Smoking status: Never Smoker  . Smokeless tobacco: Never Used  Substance and Sexual Activity  . Alcohol use: No  . Drug use: No  . Sexual activity: Not Currently  Other Topics Concern  . Not on file  Social History Narrative  . Not on file   Social Determinants of Health   Financial Resource Strain: Medium Risk  . Difficulty of Paying Living Expenses: Somewhat hard  Food Insecurity: No Food Insecurity  . Worried About Charity fundraiser in the Last Year: Never true  . Ran Out of Food in the Last Year: Never true  Transportation Needs:   . Lack of Transportation (Medical):   Marland Kitchen Lack of Transportation (Non-Medical):   Physical Activity:   . Days of Exercise per Week:   . Minutes of Exercise per Session:   Stress: No Stress Concern Present  . Feeling of Stress : Only a little  Social Connections:   . Frequency of Communication with Friends and Family:   . Frequency of Social Gatherings with Friends and Family:   . Attends Religious Services:   . Active Member of Clubs or Organizations:   . Attends Archivist Meetings:   Marland Kitchen Marital Status:   Intimate Partner Violence:   .  Fear of Current or Ex-Partner:   . Emotionally Abused:   Marland Kitchen Physically Abused:   . Sexually Abused:     Outpatient Medications Prior to Visit  Medication Sig Dispense Refill  . albuterol (VENTOLIN HFA) 108 (90 Base) MCG/ACT inhaler TAKE 2 PUFFS BY MOUTH EVERY 6 HOURS AS NEEDED FOR WHEEZE OR SHORTNESS OF BREATH 18 g 11  . amLODipine (NORVASC) 10 MG tablet TAKE 1 TABLET BY MOUTH EVERY DAY 90 tablet 0  . aspirin 81 MG chewable tablet Chew 4 tablets by mouth daily.     . Biotin 10 MG CAPS Take 1 capsule by mouth daily.     . calcium-vitamin D (OSCAL WITH D) 500-200 MG-UNIT TABS tablet TAKE 1 TABLET BY MOUTH EVERY DAY WITH BREAKFAST 90 tablet 1  . Cholecalciferol (VITAMIN D-3 PO) Take 1 capsule by mouth daily.     Marland Kitchen denosumab (PROLIA) 60 MG/ML SOSY injection  Bring to your doctor to inject 60 mg subcutaneously. 1 mL 0  . diclofenac Sodium (VOLTAREN) 1 % GEL Apply 4 g topically 4 (four) times daily. 350 g 5  . diphenhydrAMINE (BENADRYL) 25 MG tablet Take 50 mg by mouth at bedtime as needed for allergies.     Marland Kitchen gabapentin (NEURONTIN) 100 MG capsule Take 2 capsules (200 mg total) by mouth 3 (three) times daily. 120 capsule 1  . ketoconazole (NIZORAL) 2 % cream APPLY TO AFFECTED AREA EVERY DAY 60 g 2  . levothyroxine (SYNTHROID) 100 MCG tablet Take 1 Tablet by mouth once daily BEFORE BREAKFAST 90 tablet 1  . lidocaine (LIDODERM) 5 % Place 1 patch onto the skin daily. Remove & Discard patch within 12 hours or as directed by MD 30 patch 0  . lisinopril (ZESTRIL) 20 MG tablet Take 1 tablet (20 mg total) by mouth daily. 90 tablet 3  . lovastatin (MEVACOR) 40 MG tablet Take 1 tablet (40 mg total) by mouth at bedtime. 90 tablet 3  . Multiple Vitamin (MULTI-VITAMIN) tablet Take by mouth.    . Multiple Vitamins-Minerals (PRESERVISION AREDS 2 PO) Take by mouth.    . nystatin (MYCOSTATIN/NYSTOP) powder Apply topically 4 (four) times daily. 15 g 0  . nystatin cream (MYCOSTATIN) Apply 1 application topically 2 (two) times daily. 30 g PRN  . omeprazole (PRILOSEC) 20 MG capsule TAKE 1 CAPSULE (20 MG TOTAL) BY MOUTH 2 (TWO) TIMES DAILY BEFORE A MEAL. 180 capsule 0  . pyridOXINE (VITAMIN B-6) 100 MG tablet Take 100 mg by mouth daily.    . sertraline (ZOLOFT) 100 MG tablet Take 2 tablets (200 mg total) by mouth daily. 60 tablet 3  . sodium chloride (OCEAN) 0.65 % SOLN nasal spray Place 1 spray into both nostrils as needed for congestion. 1 Bottle prn   No facility-administered medications prior to visit.    Allergies  Allergen Reactions  . Crestor [Rosuvastatin] Other (See Comments)    Body aches.   . Nsaids     Burns stomach.   . Penicillins Hives    Has patient had a PCN reaction causing immediate rash, facial/tongue/throat swelling, SOB or lightheadedness with  hypotension: Yes Has patient had a PCN reaction causing severe rash involving mucus membranes or skin necrosis: unknown Has patient had a PCN reaction that required hospitalization No Has patient had a PCN reaction occurring within the last 10 years: No If all of the above answers are "NO", then may proceed with Cephalosporin use.   . Vytorin [Ezetimibe-Simvastatin]     Body aches.   Marland Kitchen  Tolmetin Nausea Only    Burns stomach.     Review of Systems  Constitutional: Negative for activity change, appetite change, fatigue and fever.  HENT: Negative.   Eyes: Negative.   Respiratory: Negative for cough and shortness of breath.   Cardiovascular: Negative for chest pain and palpitations.  Gastrointestinal: Negative for abdominal distention, abdominal pain and nausea.  Endocrine: Negative.   Genitourinary: Negative.   Musculoskeletal: Positive for back pain.  Skin: Positive for rash. Negative for color change.  Neurological: Negative for headaches.  Psychiatric/Behavioral: The patient is not nervous/anxious.        Objective:    Physical Exam Constitutional:      Appearance: Normal appearance.  Eyes:     Conjunctiva/sclera: Conjunctivae normal.  Cardiovascular:     Rate and Rhythm: Regular rhythm.     Pulses: Normal pulses.     Heart sounds: Normal heart sounds.  Pulmonary:     Breath sounds: Normal breath sounds.  Abdominal:     General: Bowel sounds are normal.     Tenderness: There is no abdominal tenderness.  Musculoskeletal:        General: Tenderness present.     Cervical back: Normal range of motion.     Comments: Back pain  Skin:    General: Skin is warm.     Findings: No erythema or rash.  Neurological:     Mental Status: She is alert.  Psychiatric:        Mood and Affect: Mood normal.        Behavior: Behavior normal.     BP (!) 130/59   Pulse (!) 54   Temp 98.3 F (36.8 C)   Resp 20   Ht 5\' 3"  (1.6 m)   Wt 192 lb (87.1 kg)   SpO2 96%   BMI 34.01  kg/m  Wt Readings from Last 3 Encounters:  05/14/20 192 lb (87.1 kg)  04/05/20 185 lb 8 oz (84.1 kg)  03/15/20 189 lb (85.7 kg)    Health Maintenance Due  Topic Date Due  . OPHTHALMOLOGY EXAM  02/09/2015  . FOOT EXAM  03/12/2020      Lab Results  Component Value Date   TSH 1.740 03/15/2020   Lab Results  Component Value Date   WBC 5.3 03/15/2020   HGB 14.5 03/15/2020   HCT 43.8 03/15/2020   MCV 95 03/15/2020   PLT 203 03/15/2020   Lab Results  Component Value Date   NA 139 03/15/2020   K 4.3 03/15/2020   CO2 27 03/15/2020   GLUCOSE 81 03/15/2020   BUN 12 03/15/2020   CREATININE 0.61 03/15/2020   BILITOT 0.3 03/15/2020   ALKPHOS 68 03/15/2020   AST 18 03/15/2020   ALT 20 03/15/2020   PROT 6.7 03/15/2020   ALBUMIN 4.5 03/15/2020   CALCIUM 10.2 03/15/2020   ANIONGAP 11 10/17/2019   Lab Results  Component Value Date   CHOL 168 03/15/2020   Lab Results  Component Value Date   HDL 44 03/15/2020   Lab Results  Component Value Date   LDLCALC 75 03/15/2020   Lab Results  Component Value Date   TRIG 304 (H) 03/15/2020   Lab Results  Component Value Date   CHOLHDL 3.8 03/15/2020   Lab Results  Component Value Date   HGBA1C 6.5 03/15/2020       Assessment & Plan:  Chronic midline low back pain without sciatica Back pain not well controlled, patient has tried different treatments with only  mild to moderate effect. patient is unable to use NSAID. Started patient on prednisone pack to help with inflammation, flexeril as needed  to help relax back muscles and tylenol for aches and pain. Provided education to patient with printed handout. Patient knows to follow up with worsening or unresolved symptoms.  Rx sent to pharmacy.  Problem List Items Addressed This Visit      Other   Chronic midline low back pain without sciatica - Primary   Relevant Medications   predniSONE (STERAPRED UNI-PAK 21 TAB) 10 MG (21) TBPK tablet   cyclobenzaprine (FLEXERIL) 5 MG  tablet   acetaminophen (TYLENOL) 500 MG tablet       Meds ordered this encounter  Medications  . predniSONE (STERAPRED UNI-PAK 21 TAB) 10 MG (21) TBPK tablet    Sig: 60 mg po day 1, 50 mg day 2, 40 mg day 3, 30 mg 4, 20 mg day 5, 10 mg day 6    Dispense:  1 each    Refill:  0    Order Specific Question:   Supervising Provider    Answer:   Caryl Pina A [id number]  . cyclobenzaprine (FLEXERIL) 5 MG tablet    Sig: Take 1 tablet (5 mg total) by mouth 3 (three) times daily as needed for muscle spasms.    Dispense:  30 tablet    Refill:  1    Order Specific Question:   Supervising Provider    Answer:   Caryl Pina A [id number]  . acetaminophen (TYLENOL) 500 MG tablet    Sig: Take 1 tablet (500 mg total) by mouth every 6 (six) hours as needed.    Dispense:  30 tablet    Refill:  0    Order Specific Question:   Supervising Provider    Answer:   Caryl Pina A [id number]     Ivy Lynn, NP

## 2020-05-14 NOTE — Patient Instructions (Addendum)
Chronic midline low back pain without sciatica Back pain not well controlled, patient has tried different treatments with only mild to moderate effect. patient is unable to use NSAID. Started patient on prednisone pack to help with inflammation, flexeril as needed  to help relax back muscles and tylenol for aches and pain. Provided education to patient with printed handout. Patient knows to follow up with worsening or unresolved symptoms.  Rx sent to pharmacy.    Chronic Back Pain When back pain lasts longer than 3 months, it is called chronic back pain.The cause of your back pain may not be known. Some common causes include:  Wear and tear (degenerative disease) of the bones, ligaments, or disks in your back.  Inflammation and stiffness in your back (arthritis). People who have chronic back pain often go through certain periods in which the pain is more intense (flare-ups). Many people can learn to manage the pain with home care. Follow these instructions at home: Pay attention to any changes in your symptoms. Take these actions to help with your pain: Activity   Avoid bending and other activities that make the problem worse.  Maintain a proper position when standing or sitting: ? When standing, keep your upper back and neck straight, with your shoulders pulled back. Avoid slouching. ? When sitting, keep your back straight and relax your shoulders. Do not round your shoulders or pull them backward.  Do not sit or stand in one place for long periods of time.  Take brief periods of rest throughout the day. This will reduce your pain. Resting in a lying or standing position is usually better than sitting to rest.  When you are resting for longer periods, mix in some mild activity or stretching between periods of rest. This will help to prevent stiffness and pain.  Get regular exercise. Ask your health care provider what activities are safe for you.  Do not lift anything that is  heavier than 10 lb (4.5 kg). Always use proper lifting technique, which includes: ? Bending your knees. ? Keeping the load close to your body. ? Avoiding twisting.  Sleep on a firm mattress in a comfortable position. Try lying on your side with your knees slightly bent. If you lie on your back, put a pillow under your knees. Managing pain  If directed, apply ice to the painful area. Your health care provider may recommend applying ice during the first 24-48 hours after a flare-up begins. ? Put ice in a plastic bag. ? Place a towel between your skin and the bag. ? Leave the ice on for 20 minutes, 2-3 times per day.  If directed, apply heat to the affected area as often as told by your health care provider. Use the heat source that your health care provider recommends, such as a moist heat pack or a heating pad. ? Place a towel between your skin and the heat source. ? Leave the heat on for 20-30 minutes. ? Remove the heat if your skin turns bright red. This is especially important if you are unable to feel pain, heat, or cold. You may have a greater risk of getting burned.  Try soaking in a warm tub.  Take over-the-counter and prescription medicines only as told by your health care provider.  Keep all follow-up visits as told by your health care provider. This is important. Contact a health care provider if:  You have pain that is not relieved with rest or medicine. Get help right away if:  You have weakness or numbness in one or both of your legs or feet.  You have trouble controlling your bladder or your bowels.  You have nausea or vomiting.  You have pain in your abdomen.  You have shortness of breath or you faint. This information is not intended to replace advice given to you by your health care provider. Make sure you discuss any questions you have with your health care provider. Document Revised: 04/03/2019 Document Reviewed: 06/20/2017 Elsevier Patient Education  [place].

## 2020-06-07 ENCOUNTER — Ambulatory Visit (INDEPENDENT_AMBULATORY_CARE_PROVIDER_SITE_OTHER): Payer: Medicare Other | Admitting: Family Medicine

## 2020-06-07 ENCOUNTER — Other Ambulatory Visit: Payer: Self-pay

## 2020-06-07 ENCOUNTER — Other Ambulatory Visit: Payer: Self-pay | Admitting: Family Medicine

## 2020-06-07 ENCOUNTER — Other Ambulatory Visit: Payer: Self-pay | Admitting: *Deleted

## 2020-06-07 ENCOUNTER — Encounter: Payer: Self-pay | Admitting: Family Medicine

## 2020-06-07 VITALS — BP 130/68 | HR 62 | Temp 97.0°F | Ht 63.0 in | Wt 187.0 lb

## 2020-06-07 DIAGNOSIS — E7800 Pure hypercholesterolemia, unspecified: Secondary | ICD-10-CM | POA: Diagnosis not present

## 2020-06-07 DIAGNOSIS — N3281 Overactive bladder: Secondary | ICD-10-CM | POA: Diagnosis not present

## 2020-06-07 DIAGNOSIS — Z1159 Encounter for screening for other viral diseases: Secondary | ICD-10-CM | POA: Diagnosis not present

## 2020-06-07 DIAGNOSIS — E1169 Type 2 diabetes mellitus with other specified complication: Secondary | ICD-10-CM | POA: Diagnosis not present

## 2020-06-07 DIAGNOSIS — R350 Frequency of micturition: Secondary | ICD-10-CM | POA: Diagnosis not present

## 2020-06-07 DIAGNOSIS — E039 Hypothyroidism, unspecified: Secondary | ICD-10-CM

## 2020-06-07 DIAGNOSIS — I10 Essential (primary) hypertension: Secondary | ICD-10-CM

## 2020-06-07 DIAGNOSIS — E1159 Type 2 diabetes mellitus with other circulatory complications: Secondary | ICD-10-CM

## 2020-06-07 DIAGNOSIS — R531 Weakness: Secondary | ICD-10-CM

## 2020-06-07 DIAGNOSIS — B3749 Other urogenital candidiasis: Secondary | ICD-10-CM

## 2020-06-07 DIAGNOSIS — E785 Hyperlipidemia, unspecified: Secondary | ICD-10-CM

## 2020-06-07 DIAGNOSIS — Z9181 History of falling: Secondary | ICD-10-CM

## 2020-06-07 LAB — MICROSCOPIC EXAMINATION

## 2020-06-07 LAB — URINALYSIS, COMPLETE
Bilirubin, UA: NEGATIVE
Glucose, UA: NEGATIVE
Ketones, UA: NEGATIVE
Nitrite, UA: NEGATIVE
Protein,UA: NEGATIVE
Specific Gravity, UA: 1.02 (ref 1.005–1.030)
Urobilinogen, Ur: 0.2 mg/dL (ref 0.2–1.0)
pH, UA: 7 (ref 5.0–7.5)

## 2020-06-07 MED ORDER — KETOCONAZOLE 2 % EX CREA: TOPICAL_CREAM | CUTANEOUS | 2 refills | Status: DC

## 2020-06-07 MED ORDER — CEPHALEXIN 500 MG PO CAPS: 500.0000 mg | ORAL_CAPSULE | Freq: Four times a day (QID) | ORAL | 0 refills | Status: DC

## 2020-06-07 MED ORDER — MIRABEGRON ER 25 MG PO TB24: 25.0000 mg | ORAL_TABLET | Freq: Every day | ORAL | 3 refills | Status: DC

## 2020-06-07 NOTE — Progress Notes (Signed)
BP 130/68   Pulse 62   Temp (!) 97 F (36.1 C)   Ht '5\' 3"'  (1.6 m)   Wt 187 lb (84.8 kg)   SpO2 91%   BMI 33.13 kg/m    Subjective:   Patient ID: Danielle Parrish, female    DOB: 06/22/1941, 79 y.o.   MRN: [id number]  HPI: Danielle Parrish is a 79 y.o. female presenting on 06/07/2020 for Medical Management of Chronic Issues   HPI Type 2 diabetes mellitus Patient comes in today for recheck of his diabetes. Patient has been currently taking no medication. Patient is currently on an ACE inhibitor/ARB. Patient has not seen an ophthalmologist this year. Patient denies any issues with their feet. The symptom started onset as an adult hypertension and hyperlipidemia and neuropathy ARE RELATED TO DM   Hypertension Patient is currently on lisinopril and amlodipine, and their blood pressure today is 130/68. Patient denies any lightheadedness or dizziness. Patient denies headaches, blurred vision, chest pains, shortness of breath, or weakness. Denies any side effects from medication and is content with current medication.   Hypothyroidism recheck Patient is coming in for thyroid recheck today as well. They deny any issues with hair changes or heat or cold problems or diarrhea or constipation. They deny any chest pain or palpitations. They are currently on levothyroxine 181mcrograms   Hyperlipidemia Patient is coming in for recheck of his hyperlipidemia. The patient is currently taking lovastatin. They deny any issues with myalgias or history of liver damage from it. They deny any focal numbness or weakness or chest pain.   Patient also comes in complaining of increased weakness and fatigue energy.  She says she has been using her CPAP machine more.  She denies any major depression strength and has been getting worse over the past couple years.  Patient also comes in complaining of frequency and some burning but mostly frequency and she says sometimes she will be up 3 or 4 times at night  urinating.  She says sometimes she has issues during the day but mostly at night.  She also feels like she is incontinent frequently wets her pants.  Relevant past medical, surgical, family and social history reviewed and updated as indicated. Interim medical history since our last visit reviewed. Allergies and medications reviewed and updated.  Review of Systems  Constitutional: Negative for chills and fever.  Eyes: Negative for visual disturbance.  Respiratory: Negative for chest tightness and shortness of breath.   Cardiovascular: Negative for chest pain and leg swelling.  Gastrointestinal: Negative for abdominal pain.  Genitourinary: Positive for decreased urine volume, frequency and urgency. Negative for difficulty urinating, dysuria and vaginal discharge.  Musculoskeletal: Positive for back pain and gait problem. Negative for arthralgias.  Skin: Negative for rash.  Neurological: Negative for light-headedness and headaches.  Psychiatric/Behavioral: Negative for agitation and behavioral problems.  All other systems reviewed and are negative.   Per HPI unless specifically indicated above   Allergies as of 06/07/2020      Reactions   Crestor [rosuvastatin] Other (See Comments)   Body aches.    Nsaids    Burns stomach.    Penicillins Hives   Has patient had a PCN reaction causing immediate rash, facial/tongue/throat swelling, SOB or lightheadedness with hypotension: Yes Has patient had a PCN reaction causing severe rash involving mucus membranes or skin necrosis: unknown Has patient had a PCN reaction that required hospitalization No Has patient had a PCN reaction occurring within the last 10 years:  No If all of the above answers are "NO", then may proceed with Cephalosporin use.   Vytorin [ezetimibe-simvastatin]    Body aches.    Tolmetin Nausea Only   Burns stomach.       Medication List       Accurate as of June 07, 2020 11:51 AM. If you have any questions, ask your  nurse or doctor.        STOP taking these medications   predniSONE 10 MG (21) Tbpk tablet Commonly known as: STERAPRED UNI-PAK 21 TAB Stopped by: Fransisca Kaufmann Tymar Polyak, MD     TAKE these medications   acetaminophen 500 MG tablet Commonly known as: TYLENOL Take 1 tablet (500 mg total) by mouth every 6 (six) hours as needed.   albuterol 108 (90 Base) MCG/ACT inhaler Commonly known as: VENTOLIN HFA TAKE 2 PUFFS BY MOUTH EVERY 6 HOURS AS NEEDED FOR WHEEZE OR SHORTNESS OF BREATH   amLODipine 10 MG tablet Commonly known as: NORVASC TAKE 1 TABLET BY MOUTH EVERY DAY   aspirin 81 MG chewable tablet Chew 4 tablets by mouth daily.   Biotin 10 MG Caps Take 1 capsule by mouth daily.   calcium-vitamin D 500-200 MG-UNIT Tabs tablet Commonly known as: OSCAL WITH D TAKE 1 TABLET BY MOUTH EVERY DAY WITH BREAKFAST   cyclobenzaprine 5 MG tablet Commonly known as: FLEXERIL Take 1 tablet (5 mg total) by mouth 3 (three) times daily as needed for muscle spasms.   denosumab 60 MG/ML Sosy injection Commonly known as: PROLIA Bring to your doctor to inject 60 mg subcutaneously.   diclofenac Sodium 1 % Gel Commonly known as: Voltaren Apply 4 g topically 4 (four) times daily.   diphenhydrAMINE 25 MG tablet Commonly known as: BENADRYL Take 50 mg by mouth at bedtime as needed for allergies.   gabapentin 100 MG capsule Commonly known as: NEURONTIN Take 2 capsules (200 mg total) by mouth 3 (three) times daily.   ketoconazole 2 % cream Commonly known as: NIZORAL APPLY TO AFFECTED AREA EVERY DAY   levothyroxine 100 MCG tablet Commonly known as: SYNTHROID Take 1 Tablet by mouth once daily BEFORE BREAKFAST   lidocaine 5 % Commonly known as: Lidoderm Place 1 patch onto the skin daily. Remove & Discard patch within 12 hours or as directed by MD   lisinopril 20 MG tablet Commonly known as: ZESTRIL Take 1 tablet (20 mg total) by mouth daily.   lovastatin 40 MG tablet Commonly known as:  MEVACOR Take 1 tablet (40 mg total) by mouth at bedtime.   mirabegron ER 25 MG Tb24 tablet Commonly known as: Myrbetriq Take 1 tablet (25 mg total) by mouth daily. Started by: Worthy Rancher, MD   Multi-Vitamin tablet Take by mouth.   nystatin powder Commonly known as: MYCOSTATIN/NYSTOP Apply topically 4 (four) times daily.   nystatin cream Commonly known as: MYCOSTATIN Apply 1 application topically 2 (two) times daily.   omeprazole 20 MG capsule Commonly known as: PRILOSEC TAKE 1 CAPSULE (20 MG TOTAL) BY MOUTH 2 (TWO) TIMES DAILY BEFORE A MEAL.   PRESERVISION AREDS 2 PO Take by mouth.   pyridOXINE 100 MG tablet Commonly known as: VITAMIN B-6 Take 100 mg by mouth daily.   sertraline 100 MG tablet Commonly known as: ZOLOFT Take 2 tablets (200 mg total) by mouth daily.   sodium chloride 0.65 % Soln nasal spray Commonly known as: OCEAN Place 1 spray into both nostrils as needed for congestion.   VITAMIN D-3 PO Take 1  capsule by mouth daily.        Objective:   BP 130/68   Pulse 62   Temp (!) 97 F (36.1 C)   Ht '5\' 3"'  (1.6 m)   Wt 187 lb (84.8 kg)   SpO2 91%   BMI 33.13 kg/m   Wt Readings from Last 3 Encounters:  06/07/20 187 lb (84.8 kg)  05/14/20 192 lb (87.1 kg)  04/05/20 185 lb 8 oz (84.1 kg)    Physical Exam Vitals and nursing note reviewed.  Constitutional:      General: She is not in acute distress.    Appearance: She is well-developed. She is not diaphoretic.  Eyes:     Conjunctiva/sclera: Conjunctivae normal.  Cardiovascular:     Rate and Rhythm: Normal rate and regular rhythm.     Heart sounds: Normal heart sounds. No murmur heard.   Pulmonary:     Effort: Pulmonary effort is normal. No respiratory distress.     Breath sounds: Normal breath sounds. No wheezing.  Musculoskeletal:        General: No tenderness. Normal range of motion.     Comments: No specific weakness, just generalized and unbalanced gait  Skin:    General: Skin  is warm and dry.     Findings: No rash.  Neurological:     Mental Status: She is alert and oriented to person, place, and time.     Coordination: Coordination normal.  Psychiatric:        Behavior: Behavior normal.       Assessment & Plan:   Problem List Items Addressed This Visit      Cardiovascular and Mediastinum   Hypertension associated with type 2 diabetes mellitus ([place])     Endocrine   Hyperlipidemia associated with type 2 diabetes mellitus ([place])   Type 2 diabetes mellitus with hypercholesterolemia ([place])   Relevant Orders   BMP8+EGFR   Hypothyroidism   Relevant Orders   TSH    Other Visit Diagnoses    Encounter for hepatitis C screening test for low risk patient    -  Primary   Relevant Orders   Hepatitis C antibody   OAB (overactive bladder)       Relevant Medications   mirabegron ER (MYRBETRIQ) 25 MG TB24 tablet   Other Relevant Orders   Urinalysis, Complete   Urine Culture   Urinary frequency       Relevant Orders   Urinalysis, Complete   Urine Culture   Weakness       Relevant Orders   Ambulatory referral to Physical Therapy   At high risk for falls       Relevant Orders   Ambulatory referral to Physical Therapy      Will do referral for the physical therapy for her back and for her strength.  We will do a urine to make sure there is no signs of infection.  We will start on Myrbetriq to help with her overactive bladder and nighttime urination.  Check blood work today Follow up plan: Return in about 3 months (around 09/07/2020), or if symptoms worsen or fail to improve, for Diabetes and hypertension and thyroid.  Counseling provided for all of the vaccine components Orders Placed This Encounter  Procedures  . Urine Culture  . Hepatitis C antibody  . BMP8+EGFR  . TSH  . Urinalysis, Complete  . Ambulatory referral to Physical Therapy    Caryl Pina, MD [facility] 06/07/2020, 11:51 AM

## 2020-06-08 LAB — BMP8+EGFR
BUN/Creatinine Ratio: 17 (ref 12–28)
BUN: 10 mg/dL (ref 8–27)
CO2: 25 mmol/L (ref 20–29)
Calcium: 10.3 mg/dL (ref 8.7–10.3)
Chloride: 96 mmol/L (ref 96–106)
Creatinine, Ser: 0.59 mg/dL (ref 0.57–1.00)
GFR calc Af Amer: 101 mL/min/{1.73_m2} (ref >59)
GFR calc non Af Amer: 87 mL/min/{1.73_m2} (ref >59)
Glucose: 108 mg/dL — ABNORMAL HIGH (ref 65–99)
Potassium: 4.5 mmol/L (ref 3.5–5.2)
Sodium: 137 mmol/L (ref 134–144)

## 2020-06-08 LAB — TSH: TSH: 1.73 u[IU]/mL (ref 0.450–4.500)

## 2020-06-08 LAB — HEPATITIS C ANTIBODY: Hep C Virus Ab: <0.1 s/co ratio (ref 0.0–0.9)

## 2020-06-08 LAB — URINE CULTURE

## 2020-06-09 ENCOUNTER — Other Ambulatory Visit: Payer: Self-pay | Admitting: *Deleted

## 2020-06-09 DIAGNOSIS — F339 Major depressive disorder, recurrent, unspecified: Secondary | ICD-10-CM

## 2020-06-09 MED ORDER — SERTRALINE HCL 100 MG PO TABS: 200.0000 mg | ORAL_TABLET | Freq: Every day | ORAL | 3 refills | Status: DC

## 2020-06-14 ENCOUNTER — Ambulatory Visit: Payer: Medicare Other | Attending: Family Medicine | Admitting: Physical Therapy

## 2020-06-14 ENCOUNTER — Encounter: Payer: Self-pay | Admitting: Physical Therapy

## 2020-06-14 ENCOUNTER — Other Ambulatory Visit: Payer: Self-pay

## 2020-06-14 DIAGNOSIS — M6281 Muscle weakness (generalized): Secondary | ICD-10-CM | POA: Diagnosis not present

## 2020-06-14 DIAGNOSIS — M545 Low back pain, unspecified: Secondary | ICD-10-CM

## 2020-06-14 DIAGNOSIS — G8929 Other chronic pain: Secondary | ICD-10-CM | POA: Diagnosis not present

## 2020-06-14 DIAGNOSIS — R262 Difficulty in walking, not elsewhere classified: Secondary | ICD-10-CM

## 2020-06-14 NOTE — Therapy (Signed)
[facility] [place], [place], [address] Phone: [phone]   Fax:  [phone]  Physical Therapy Evaluation  Patient Details  Name: Danielle Parrish MRN: [id number] Date of Birth: 26-Jun-1941 Referring Provider (PT): Caryl Pina, MD   Encounter Date: 06/14/2020   PT End of Session - 06/14/20 2144    Visit Number 1    Number of Visits 12    Date for PT Re-Evaluation 08/02/20    Authorization Type Progress note every 10th visit; KX modifier at 15th visit    PT Start Time 1030    PT Stop Time 1110    PT Time Calculation (min) 40 min    Activity Tolerance Patient tolerated treatment well    Behavior During Therapy [facility] for tasks assessed/performed           Past Medical History:  Diagnosis Date  . Anxiety   . Arthritis   . CHF (congestive heart failure) ([place])   . Chronic pain of multiple joints   . Depression   . Essential hypertension   . GERD (gastroesophageal reflux disease)   . Hyperlipidemia   . Hyperlipidemia associated with type 2 diabetes mellitus ([place])   . Hypertension associated with type 2 diabetes mellitus ([place])   . Hypothyroidism   . Osteoporosis   . Sleep apnea    CPAP  . Type 2 diabetes mellitus with hypercholesterolemia ([place])   . Vitamin D deficiency     Past Surgical History:  Procedure Laterality Date  . ABDOMINAL HERNIA REPAIR    . ABDOMINAL HYSTERECTOMY  1977  . BREAST EXCISIONAL BIOPSY Right   . EYE SURGERY  2020   bilateral cataract removal   . KNEE ARTHROSCOPY Left 09/30/2014   Procedure: LEFT KNEE ARTHROSCOPY MEDIAL MENISECTOMY ABRASION CHONDROPLASTY SYNOVECTOMY SUPRAPATELLER CUFF;  Surgeon: Tobi Bastos, MD;  Location: [facility];  Service: Orthopedics;  Laterality: Left;    There were no vitals filed for this visit.    Subjective Assessment - 06/14/20 2140    Subjective COVID-19 screening performed upon arrival.Patient arrives with reports of bilateral LE weakness and  right sided low back pain that has progressed over the past year. Patient reports low back pain and weakness comes and goes and feels like on her good days she is able to do a lot more of her ADLs without rest. Patient reports increasing difficulties with transfers and utilizes a SPC only when needed. Patient reports low back pain at worst as 5/10 and pain at best is 0/10 with medication. Patient's goals are to improve movement, improve strength, improve ability to perform ADLs and improve balance.    Pertinent History Anxiety, Arthritis, CHF, Depression, HTN, Osteoporosis, DM 2    Limitations House hold activities;Walking    Patient Stated Goals improve balance, move better along    Currently in Pain? Yes    Pain Score 5     Pain Location Back    Pain Orientation Right    Pain Descriptors / Indicators Discomfort    Pain Type Chronic pain    Pain Radiating Towards Right  flank    Pain Onset More than a month ago    Pain Frequency Intermittent    Aggravating Factors  "it just starts hurting"    Pain Relieving Factors "gabapentin, tylenol arthritis"    Effect of Pain on Daily Activities hard to go up steps with laundry              [facility] PT Assessment -  06/14/20 0001      Assessment   Medical Diagnosis Weakness, At high risk for falls    Referring Provider (PT) Caryl Pina, MD    Onset Date/Surgical Date --   ongoing   Next MD Visit "3 months"    Prior Therapy no      Precautions   Precautions Fall      Balance Screen   Has the patient fallen in the past 6 months No    Has the patient had a decrease in activity level because of a fear of falling?  No    Is the patient reluctant to leave their home because of a fear of falling?  No      Home Environment   Living Environment Private residence    Living Arrangements Alone    Type of [place] or work area in basement      Prior Function   Level of [place] with basic ADLs       ROM / Strength   AROM / PROM / Strength Strength      Strength   Overall Strength Deficits    Strength Assessment Site Hip;Knee    Right/Left Hip Right;Left    Right Hip Flexion 3+/5    Right Hip Extension 3+/5    Right Hip ABduction 3+/5    Left Hip Flexion 3+/5    Left Hip Extension 3+/5    Left Hip ABduction 3+/5    Right/Left Knee Right;Left    Right Knee Flexion 4/5    Right Knee Extension 4/5    Left Knee Flexion 4/5    Left Knee Extension 4/5      Palpation   Palpation comment slight tenderness to R lumbar paraspinals and R flank      Transfers   Comments slow transitional movements      Ambulation/Gait   Gait Pattern Step-through pattern;Decreased stride length;Antalgic;Trunk flexed;Wide base of support    Ambulation Surface Level;Indoor      Balance   Balance Assessed Yes      Standardized Balance Assessment   Standardized Balance Assessment Five Times Sit to Stand;Berg Balance Test    Five times sit to stand comments  24.36 seconds with UE support on knees      Berg Balance Test   Merrilee Jansky comment: To be completed next visit                      Objective measurements completed on examination: See above findings.               PT Education - 06/14/20 2143    Education Details draw ins, marching, hip abduction no band, pillow squeeze    Person(s) Educated Patient    Methods Explanation;Demonstration;Handout    Comprehension Verbalized understanding;Returned demonstration               PT Long Term Goals - 06/14/20 2146      PT LONG TERM GOAL #1   Title Patient will be independent with HEP    Time 6    Period Weeks    Status New      PT LONG TERM GOAL #2   Title Patient will demonstrate 4+/5 or greater bilateral LE MMT to improve stability during functional tasks    Time 6    Period Weeks    Status New      PT LONG TERM GOAL #3   Title Patient  will improve functional LE strength as noted by the ability to perform  modified 5x sit to stand test with UE support in 20 seconds or less.    Time 6    Period Weeks    Status New      PT LONG TERM GOAL #4   Title Patient report ability to walk in community for 10 mins or greater with no rest stops to improve cardiovascular and muscular endurance.    Time 6    Period Weeks    Status New      PT LONG TERM GOAL #5   Title Patient will reduce the risk of fall as noted by 5+ point improvement on the [facility] Balance Scale.    Time 6    Period Weeks    Status New                  Plan - 06/14/20 2200    Clinical Impression Statement Patient is a 79 year old female who presents to physical therapy with a chronic history of decreased bilateral hip MM and low back pain. Patient's modified 5x sit to stand with use of hands on knees time of 24.38 seconds categorizes her as a fall risk with decreased functional strength. Patient noted with tenderness to R lumbar paraspinals and R flank. Patient and PT discussed plan of care and HEP to which patient reported understanding. Patient would benefit from skilled physical therapy to address deficits and address patient's goals.    Personal Factors and Comorbidities Comorbidity 3+;Time since onset of injury/illness/exacerbation    Comorbidities Anxiety, Arthritis, CHF, Depression, HTN, Osteoporosis, DM 2    Examination-Activity Limitations Locomotion Level;Transfers;Stairs;Stand    Examination-Participation Restrictions Laundry    Stability/Clinical Decision Making Stable/Uncomplicated    Clinical Decision Making Low    Rehab Potential Good    PT Frequency 2x / week    PT Duration 6 weeks    PT Treatment/Interventions ADLs/Self Care Home Management;Cryotherapy;Electrical Stimulation;Iontophoresis 4mg /ml Dexamethasone;Moist Heat;Ultrasound;Therapeutic exercise;Therapeutic activities;Functional mobility training;Balance training;Neuromuscular re-education;Stair training;Gait training;Patient/family education;Passive range of  motion;Manual techniques    PT Next Visit Plan Please perform Berg balance scale; Nustep, LE strengthening, LE stretching, modalities PRN for pain relief    PT Home Exercise Plan see patient education section    Consulted and Agree with Plan of Care Patient           Patient will benefit from skilled therapeutic intervention in order to improve the following deficits and impairments:  Decreased activity tolerance, Decreased balance, Decreased strength, Decreased range of motion, Difficulty walking, Pain, Postural dysfunction  Visit Diagnosis: Muscle weakness (generalized) - Plan: PT plan of care cert/re-cert  Difficulty in walking, not elsewhere classified - Plan: PT plan of care cert/re-cert  Chronic right-sided low back pain, unspecified whether sciatica present - Plan: PT plan of care cert/re-cert     Problem List Patient Active Problem List   Diagnosis Date Noted  . Age-related osteoporosis with current pathological fracture 03/13/2019  . Chronic midline low back pain without sciatica 01/30/2019  . Age-related osteoporosis with current pathol fracture of vertebra ([place]) 10/28/2018  . Calcification of aorta (HCC) 10/02/2018  . Compression fracture of T12 vertebra ([place]) 10/01/2018  . Primary osteoarthritis of right knee 07/20/2017  . Meniscus degeneration, right 06/14/2017  . Varicose veins of left lower extremity with complications 09/38/1829  . Pain medication agreement signed 06/06/2016  . Gout attack 01/11/2016  . OSA (obstructive sleep apnea) 09/14/2015  . Chronic diastolic heart failure ([place]) 09/10/2015  .  Orthopnea 09/02/2015  . Dysphagia, pharyngoesophageal phase 08/04/2015  . Anorexia 08/04/2015  . Fatigue 08/04/2015  . Malaise and fatigue 08/04/2015  . Depression, recurrent ([place]) 04/08/2015  . Osteoarthritis of left knee 09/30/2014  . Vitamin D deficiency   . Anxiety   . GERD (gastroesophageal reflux disease)   . Arthritis   . Chronic pain of multiple joints    . Hypertension associated with type 2 diabetes mellitus ([place]) 05/15/2013  . Hyperlipidemia associated with type 2 diabetes mellitus ([place]) 05/15/2013  . Type 2 diabetes mellitus with hypercholesterolemia ([place]) 05/15/2013  . Morbid obesity ([place]) 05/15/2013  . Hypothyroidism 05/15/2013    Gabriela Eves, PT, DPT 06/14/2020, 11:09 PM  [facility] [place], [place], [address] Phone: [phone]   Fax:  [phone]  Name: Esparanza Krider MRN: [id number] Date of Birth: [birth year]

## 2020-06-16 ENCOUNTER — Encounter: Payer: Self-pay | Admitting: Physical Therapy

## 2020-06-16 ENCOUNTER — Ambulatory Visit: Payer: Medicare Other | Admitting: Physical Therapy

## 2020-06-16 ENCOUNTER — Other Ambulatory Visit: Payer: Self-pay

## 2020-06-16 DIAGNOSIS — M6281 Muscle weakness (generalized): Secondary | ICD-10-CM | POA: Diagnosis not present

## 2020-06-16 DIAGNOSIS — R262 Difficulty in walking, not elsewhere classified: Secondary | ICD-10-CM

## 2020-06-16 DIAGNOSIS — G8929 Other chronic pain: Secondary | ICD-10-CM

## 2020-06-16 DIAGNOSIS — M545 Low back pain, unspecified: Secondary | ICD-10-CM

## 2020-06-16 NOTE — Therapy (Signed)
[facility] [place], [place], [address] Phone: [phone]   Fax:  [phone]  Physical Therapy Treatment  Patient Details  Name: Danielle Parrish MRN: [id number] Date of Birth: 10-16-1941 Referring Provider (PT): Caryl Pina, MD   Encounter Date: 06/16/2020   PT End of Session - 06/16/20 1446    Visit Number 2    Number of Visits 12    Date for PT Re-Evaluation 08/02/20    Authorization Type Progress note every 10th visit; KX modifier at 15th visit    PT Start Time 1435    PT Stop Time 1515    PT Time Calculation (min) 40 min    Activity Tolerance Patient tolerated treatment well    Behavior During Therapy [facility] for tasks assessed/performed           Past Medical History:  Diagnosis Date  . Anxiety   . Arthritis   . CHF (congestive heart failure) ([place])   . Chronic pain of multiple joints   . Depression   . Essential hypertension   . GERD (gastroesophageal reflux disease)   . Hyperlipidemia   . Hyperlipidemia associated with type 2 diabetes mellitus ([place])   . Hypertension associated with type 2 diabetes mellitus ([place])   . Hypothyroidism   . Osteoporosis   . Sleep apnea    CPAP  . Type 2 diabetes mellitus with hypercholesterolemia ([place])   . Vitamin D deficiency     Past Surgical History:  Procedure Laterality Date  . ABDOMINAL HERNIA REPAIR    . ABDOMINAL HYSTERECTOMY  1977  . BREAST EXCISIONAL BIOPSY Right   . EYE SURGERY  2020   bilateral cataract removal   . KNEE ARTHROSCOPY Left 09/30/2014   Procedure: LEFT KNEE ARTHROSCOPY MEDIAL MENISECTOMY ABRASION CHONDROPLASTY SYNOVECTOMY SUPRAPATELLER CUFF;  Surgeon: Tobi Bastos, MD;  Location: [facility];  Service: Orthopedics;  Laterality: Left;    There were no vitals filed for this visit.   Subjective Assessment - 06/16/20 1445    Subjective COVID 19 screening performed on patient upon arrival. Patient states that she just finished UTI  medication.    Pertinent History Anxiety, Arthritis, CHF, Depression, HTN, Osteoporosis, DM 2    Limitations House hold activities;Walking    Patient Stated Goals improve balance, move better along    Currently in Pain? Yes    Pain Score 5     Pain Location Flank    Pain Orientation Right    Pain Descriptors / Indicators Discomfort    Pain Type Chronic pain    Pain Onset More than a month ago    Pain Frequency Intermittent              [facility] PT Assessment - 06/16/20 0001      Assessment   Medical Diagnosis Weakness, At high risk for falls    Referring Provider (PT) Caryl Pina, MD    Next MD Visit "3 months"    Prior Therapy no      Precautions   Precautions [facility] [facility] Adult PT Treatment/Exercise - 06/16/20 0001      Standardized Balance Assessment   Standardized Balance Assessment Berg Balance Test      Berg Balance Test   Sit to Stand Able to stand without using hands and stabilize independently    Standing Unsupported Able to stand safely 2  minutes    Sitting with Back Unsupported but Feet Supported on Floor or Stool Able to sit safely and securely 2 minutes    Stand to Sit Sits safely with minimal use of hands    Transfers Able to transfer safely, minor use of hands    Standing Unsupported with Eyes Closed Able to stand 10 seconds safely    Standing Ubsupported with Feet Together Able to place feet together independently and stand 1 minute safely    From Standing, Reach Forward with Outstretched Arm Can reach forward >12 cm safely (5")    From Standing Position, Pick up Object from Floor Able to pick up shoe safely and easily    From Standing Position, Turn to Look Behind Over each Shoulder Turn sideways only but maintains balance    Turn 360 Degrees Able to turn 360 degrees safely in 4 seconds or less    Standing Unsupported, Alternately Place Feet on Step/Stool Able to stand independently and complete 8 steps >20 seconds     Standing Unsupported, One Foot in Front Able to place foot tandem independently and hold 30 seconds    Standing on One Leg Able to lift leg independently and hold 5-10 seconds    Total Score 51      Exercises   Exercises Knee/Hip      Knee/Hip Exercises: Aerobic   Nustep L3 x15 min, monitored for tolerance      Knee/Hip Exercises: Seated   Long Arc Quad Strengthening;Both;2 sets;10 reps    Long Arc Quad Weight 3 lbs.    Clamshell with TheraBand Yellow   x20 reps   Hamstring Curl Strengthening;Both;2 sets;10 reps;Limitations    Hamstring Limitations yellow theraband                       PT Long Term Goals - 06/14/20 2146      PT LONG TERM GOAL #1   Title Patient will be independent with HEP    Time 6    Period Weeks    Status New      PT LONG TERM GOAL #2   Title Patient will demonstrate 4+/5 or greater bilateral LE MMT to improve stability during functional tasks    Time 6    Period Weeks    Status New      PT LONG TERM GOAL #3   Title Patient will improve functional LE strength as noted by the ability to perform modified 5x sit to stand test with UE support in 20 seconds or less.    Time 6    Period Weeks    Status New      PT LONG TERM GOAL #4   Title Patient report ability to walk in community for 10 mins or greater with no rest stops to improve cardiovascular and muscular endurance.    Time 6    Period Weeks    Status New      PT LONG TERM GOAL #5   Title Patient will reduce the risk of fall as noted by 5+ point improvement on the [facility] Balance Scale.    Time 6    Period Weeks    Status New                 Plan - 06/16/20 1528    Clinical Impression Statement Patient presented in clinic with R flank pain. Patient reports a recent UTI but has finished medication recently. Patient scored an overall 51/56 on BERG  score and patient reported fear of falling especially with tandem stance and toe taps to step. Patient indicated that her RLE  was stronger than LLE. Patient educated to cotinue HEP as directed and educated regarding POC.    Personal Factors and Comorbidities Comorbidity 3+;Time since onset of injury/illness/exacerbation    Comorbidities Anxiety, Arthritis, CHF, Depression, HTN, Osteoporosis, DM 2    Examination-Activity Limitations Locomotion Level;Transfers;Stairs;Stand    Examination-Participation Restrictions Laundry    Stability/Clinical Decision Making Stable/Uncomplicated    Rehab Potential Good    PT Frequency 2x / week    PT Duration 6 weeks    PT Treatment/Interventions ADLs/Self Care Home Management;Cryotherapy;Electrical Stimulation;Iontophoresis 4mg /ml Dexamethasone;Moist Heat;Ultrasound;Therapeutic exercise;Therapeutic activities;Functional mobility training;Balance training;Neuromuscular re-education;Stair training;Gait training;Patient/family education;Passive range of motion;Manual techniques    PT Next Visit Plan Continue LE strengthening.    PT Home Exercise Plan see patient education section    Consulted and Agree with Plan of Care Patient           Patient will benefit from skilled therapeutic intervention in order to improve the following deficits and impairments:  Decreased activity tolerance, Decreased balance, Decreased strength, Decreased range of motion, Difficulty walking, Pain, Postural dysfunction  Visit Diagnosis: Muscle weakness (generalized)  Difficulty in walking, not elsewhere classified  Chronic right-sided low back pain, unspecified whether sciatica present     Problem List Patient Active Problem List   Diagnosis Date Noted  . Age-related osteoporosis with current pathological fracture 03/13/2019  . Chronic midline low back pain without sciatica 01/30/2019  . Age-related osteoporosis with current pathol fracture of vertebra ([place]) 10/28/2018  . Calcification of aorta (HCC) 10/02/2018  . Compression fracture of T12 vertebra ([place]) 10/01/2018  . Primary osteoarthritis of  right knee 07/20/2017  . Meniscus degeneration, right 06/14/2017  . Varicose veins of left lower extremity with complications 83/15/1761  . Pain medication agreement signed 06/06/2016  . Gout attack 01/11/2016  . OSA (obstructive sleep apnea) 09/14/2015  . Chronic diastolic heart failure ([place]) 09/10/2015  . Orthopnea 09/02/2015  . Dysphagia, pharyngoesophageal phase 08/04/2015  . Anorexia 08/04/2015  . Fatigue 08/04/2015  . Malaise and fatigue 08/04/2015  . Depression, recurrent ([place]) 04/08/2015  . Osteoarthritis of left knee 09/30/2014  . Vitamin D deficiency   . Anxiety   . GERD (gastroesophageal reflux disease)   . Arthritis   . Chronic pain of multiple joints   . Hypertension associated with type 2 diabetes mellitus ([place]) 05/15/2013  . Hyperlipidemia associated with type 2 diabetes mellitus ([place]) 05/15/2013  . Type 2 diabetes mellitus with hypercholesterolemia ([place]) 05/15/2013  . Morbid obesity ([place]) 05/15/2013  . Hypothyroidism 05/15/2013    Standley Brooking, PTA 06/16/2020, 3:34 PM  [facility] [address] [place], [place], [address] Phone: [phone]   Fax:  [phone]  Name: Danielle Parrish MRN: [id number] Date of Birth: 05-10-1941

## 2020-06-17 ENCOUNTER — Ambulatory Visit: Payer: Medicare Other | Admitting: Physical Therapy

## 2020-06-22 ENCOUNTER — Other Ambulatory Visit: Payer: Self-pay

## 2020-06-22 ENCOUNTER — Ambulatory Visit: Payer: Medicare Other | Admitting: Physical Therapy

## 2020-06-22 ENCOUNTER — Encounter: Payer: Self-pay | Admitting: Physical Therapy

## 2020-06-22 DIAGNOSIS — G8929 Other chronic pain: Secondary | ICD-10-CM

## 2020-06-22 DIAGNOSIS — M545 Low back pain, unspecified: Secondary | ICD-10-CM

## 2020-06-22 DIAGNOSIS — R262 Difficulty in walking, not elsewhere classified: Secondary | ICD-10-CM | POA: Diagnosis not present

## 2020-06-22 DIAGNOSIS — M6281 Muscle weakness (generalized): Secondary | ICD-10-CM | POA: Diagnosis not present

## 2020-06-22 NOTE — Therapy (Signed)
[facility] [place], [place], [address] Phone: [phone]   Fax:  [phone]  Physical Therapy Treatment  Patient Details  Name: Danielle Parrish MRN: [id number] Date of Birth: 1941/06/29 Referring Provider (PT): Caryl Pina, MD   Encounter Date: 06/22/2020   PT End of Session - 06/22/20 1432    Visit Number 3    Number of Visits 12    Date for PT Re-Evaluation 08/02/20    Authorization Type Progress note every 10th visit; KX modifier at 15th visit    PT Start Time 1432    PT Stop Time 1515    PT Time Calculation (min) 43 min    Activity Tolerance Patient tolerated treatment well    Behavior During Therapy [facility] for tasks assessed/performed           Past Medical History:  Diagnosis Date  . Anxiety   . Arthritis   . CHF (congestive heart failure) ([place])   . Chronic pain of multiple joints   . Depression   . Essential hypertension   . GERD (gastroesophageal reflux disease)   . Hyperlipidemia   . Hyperlipidemia associated with type 2 diabetes mellitus ([place])   . Hypertension associated with type 2 diabetes mellitus ([place])   . Hypothyroidism   . Osteoporosis   . Sleep apnea    CPAP  . Type 2 diabetes mellitus with hypercholesterolemia ([place])   . Vitamin D deficiency     Past Surgical History:  Procedure Laterality Date  . ABDOMINAL HERNIA REPAIR    . ABDOMINAL HYSTERECTOMY  1977  . BREAST EXCISIONAL BIOPSY Right   . EYE SURGERY  2020   bilateral cataract removal   . KNEE ARTHROSCOPY Left 09/30/2014   Procedure: LEFT KNEE ARTHROSCOPY MEDIAL MENISECTOMY ABRASION CHONDROPLASTY SYNOVECTOMY SUPRAPATELLER CUFF;  Surgeon: Tobi Bastos, MD;  Location: [facility];  Service: Orthopedics;  Laterality: Left;    There were no vitals filed for this visit.   Subjective Assessment - 06/22/20 1432    Subjective COVID 19 screening performed on patient upon arrival. Patient reports B knee pain, LBP and L heel pain  upon arrival.    Pertinent History Anxiety, Arthritis, CHF, Depression, HTN, Osteoporosis, DM 2    Limitations House hold activities;Walking    Patient Stated Goals improve balance, move better along    Currently in Pain? Yes    Pain Score 7     Pain Location Knee    Pain Orientation Right;Left    Pain Descriptors / Indicators Discomfort    Pain Type Chronic pain    Pain Onset More than a month ago    Pain Frequency Intermittent    Multiple Pain Sites Yes    Pain Score 8    Pain Location Back    Pain Orientation Lower    Pain Descriptors / Indicators Discomfort    Pain Type Chronic pain    Pain Onset More than a month ago    Pain Frequency Intermittent              [facility] PT Assessment - 06/22/20 0001      Assessment   Medical Diagnosis Weakness, At high risk for falls    Referring Provider (PT) Caryl Pina, MD    Next MD Visit "3 months"    Prior Therapy no      Precautions   Precautions Fall  [place] Adult PT Treatment/Exercise - 06/22/20 0001      Knee/Hip Exercises: Aerobic   Nustep L3 x17 min      Knee/Hip Exercises: Standing   Hip Abduction AROM;Both;15 reps;Knee straight      Knee/Hip Exercises: Seated   Long Arc Quad Strengthening;Both;2 sets;10 reps    Long Arc Quad Weight 3 lbs.    Ball Squeeze x20 reps    Clamshell with TheraBand Yellow   x20 reps   Hamstring Curl Strengthening;Both;2 sets;10 reps;Limitations    Hamstring Limitations yellow theraband      Knee/Hip Exercises: Supine   Bridges Strengthening;15 reps    Straight Leg Raises AROM;Both;15 reps                       PT Long Term Goals - 06/14/20 2146      PT LONG TERM GOAL #1   Title Patient will be independent with HEP    Time 6    Period Weeks    Status New      PT LONG TERM GOAL #2   Title Patient will demonstrate 4+/5 or greater bilateral LE MMT to improve stability during functional tasks    Time 6    Period Weeks     Status New      PT LONG TERM GOAL #3   Title Patient will improve functional LE strength as noted by the ability to perform modified 5x sit to stand test with UE support in 20 seconds or less.    Time 6    Period Weeks    Status New      PT LONG TERM GOAL #4   Title Patient report ability to walk in community for 10 mins or greater with no rest stops to improve cardiovascular and muscular endurance.    Time 6    Period Weeks    Status New      PT LONG TERM GOAL #5   Title Patient will reduce the risk of fall as noted by 5+ point improvement on the [facility] Balance Scale.    Time 6    Period Weeks    Status New                 Plan - 06/22/20 1528    Clinical Impression Statement Patient presented in clinic with reports of B knee pain and LBP. Patient states that her UTI should be better by now. Patient progressed to more strengthening in order to progress function. Patient had no complaints of any increased pain during knee pain. Patient demonstrated greater L extensor lag during SLR.    Personal Factors and Comorbidities Comorbidity 3+;Time since onset of injury/illness/exacerbation    Comorbidities Anxiety, Arthritis, CHF, Depression, HTN, Osteoporosis, DM 2    Examination-Activity Limitations Locomotion Level;Transfers;Stairs;Stand    Examination-Participation Restrictions Laundry    Stability/Clinical Decision Making Stable/Uncomplicated    Rehab Potential Good    PT Frequency 2x / week    PT Duration 6 weeks    PT Treatment/Interventions ADLs/Self Care Home Management;Cryotherapy;Electrical Stimulation;Iontophoresis 4mg /ml Dexamethasone;Moist Heat;Ultrasound;Therapeutic exercise;Therapeutic activities;Functional mobility training;Balance training;Neuromuscular re-education;Stair training;Gait training;Patient/family education;Passive range of motion;Manual techniques    PT Next Visit Plan Continue LE strengthening.    PT Home Exercise Plan see patient education section     Consulted and Agree with Plan of Care Patient           Patient will benefit from skilled therapeutic intervention in order to improve the following deficits and impairments:  Decreased activity tolerance, Decreased balance, Decreased strength, Decreased range of motion, Difficulty walking, Pain, Postural dysfunction  Visit Diagnosis: Muscle weakness (generalized)  Difficulty in walking, not elsewhere classified  Chronic right-sided low back pain, unspecified whether sciatica present     Problem List Patient Active Problem List   Diagnosis Date Noted  . Age-related osteoporosis with current pathological fracture 03/13/2019  . Chronic midline low back pain without sciatica 01/30/2019  . Age-related osteoporosis with current pathol fracture of vertebra ([place]) 10/28/2018  . Calcification of aorta (HCC) 10/02/2018  . Compression fracture of T12 vertebra ([place]) 10/01/2018  . Primary osteoarthritis of right knee 07/20/2017  . Meniscus degeneration, right 06/14/2017  . Varicose veins of left lower extremity with complications 32/35/5732  . Pain medication agreement signed 06/06/2016  . Gout attack 01/11/2016  . OSA (obstructive sleep apnea) 09/14/2015  . Chronic diastolic heart failure ([place]) 09/10/2015  . Orthopnea 09/02/2015  . Dysphagia, pharyngoesophageal phase 08/04/2015  . Anorexia 08/04/2015  . Fatigue 08/04/2015  . Malaise and fatigue 08/04/2015  . Depression, recurrent ([place]) 04/08/2015  . Osteoarthritis of left knee 09/30/2014  . Vitamin D deficiency   . Anxiety   . GERD (gastroesophageal reflux disease)   . Arthritis   . Chronic pain of multiple joints   . Hypertension associated with type 2 diabetes mellitus ([place]) 05/15/2013  . Hyperlipidemia associated with type 2 diabetes mellitus ([place]) 05/15/2013  . Type 2 diabetes mellitus with hypercholesterolemia ([place]) 05/15/2013  . Morbid obesity ([place]) 05/15/2013  . Hypothyroidism 05/15/2013    Standley Brooking,  PTA 06/22/2020, 4:07 PM  [facility] [address] [place], [place], [address] Phone: [phone]   Fax:  [phone]  Name: Keller Mikels MRN: [id number] Date of Birth: [birth year]

## 2020-06-24 ENCOUNTER — Ambulatory Visit: Payer: Medicare Other | Attending: Family Medicine | Admitting: Physical Therapy

## 2020-06-24 ENCOUNTER — Encounter: Payer: Self-pay | Admitting: Physical Therapy

## 2020-06-24 ENCOUNTER — Other Ambulatory Visit: Payer: Self-pay

## 2020-06-24 ENCOUNTER — Other Ambulatory Visit: Payer: Self-pay | Admitting: Family Medicine

## 2020-06-24 DIAGNOSIS — M545 Low back pain, unspecified: Secondary | ICD-10-CM

## 2020-06-24 DIAGNOSIS — K219 Gastro-esophageal reflux disease without esophagitis: Secondary | ICD-10-CM

## 2020-06-24 DIAGNOSIS — G8929 Other chronic pain: Secondary | ICD-10-CM

## 2020-06-24 DIAGNOSIS — M6281 Muscle weakness (generalized): Secondary | ICD-10-CM | POA: Diagnosis not present

## 2020-06-24 DIAGNOSIS — R262 Difficulty in walking, not elsewhere classified: Secondary | ICD-10-CM | POA: Diagnosis not present

## 2020-06-24 NOTE — Therapy (Signed)
[facility] [place], [place], [address] Phone: [phone]   Fax:  [phone]  Physical Therapy Treatment  Patient Details  Name: Danielle Parrish MRN: [id number] Date of Birth: 01/29/1941 Referring Provider (PT): Caryl Pina, MD   Encounter Date: 06/24/2020   PT End of Session - 06/24/20 1447    Visit Number 4    Number of Visits 12    Date for PT Re-Evaluation 08/02/20    Authorization Type Progress note every 10th visit; KX modifier at 15th visit    PT Start Time 1432    PT Stop Time 1520    PT Time Calculation (min) 48 min    Activity Tolerance Patient tolerated treatment well    Behavior During Therapy [facility] for tasks assessed/performed           Past Medical History:  Diagnosis Date  . Anxiety   . Arthritis   . CHF (congestive heart failure) ([place])   . Chronic pain of multiple joints   . Depression   . Essential hypertension   . GERD (gastroesophageal reflux disease)   . Hyperlipidemia   . Hyperlipidemia associated with type 2 diabetes mellitus ([place])   . Hypertension associated with type 2 diabetes mellitus ([place])   . Hypothyroidism   . Osteoporosis   . Sleep apnea    CPAP  . Type 2 diabetes mellitus with hypercholesterolemia ([place])   . Vitamin D deficiency     Past Surgical History:  Procedure Laterality Date  . ABDOMINAL HERNIA REPAIR    . ABDOMINAL HYSTERECTOMY  1977  . BREAST EXCISIONAL BIOPSY Right   . EYE SURGERY  2020   bilateral cataract removal   . KNEE ARTHROSCOPY Left 09/30/2014   Procedure: LEFT KNEE ARTHROSCOPY MEDIAL MENISECTOMY ABRASION CHONDROPLASTY SYNOVECTOMY SUPRAPATELLER CUFF;  Surgeon: Tobi Bastos, MD;  Location: [facility];  Service: Orthopedics;  Laterality: Left;    There were no vitals filed for this visit.   Subjective Assessment - 06/24/20 1434    Subjective COVID 19 screening performed on patient upon arrival. Patient reports that she still has some pain in low  back, knees.    Pertinent History Anxiety, Arthritis, CHF, Depression, HTN, Osteoporosis, DM 2    Limitations House hold activities;Walking    Patient Stated Goals improve balance, move better along    Currently in Pain? Yes    Pain Score 5     Pain Location Back    Pain Orientation Lower    Pain Descriptors / Indicators Discomfort    Pain Type Chronic pain    Pain Onset More than a month ago    Pain Frequency Constant    Pain Score 9    Pain Location Knee    Pain Orientation Right;Left    Pain Descriptors / Indicators Discomfort    Pain Type Chronic pain    Pain Onset More than a month ago    Pain Frequency Constant              [facility] PT Assessment - 06/24/20 0001      Assessment   Medical Diagnosis Weakness, At high risk for falls    Referring Provider (PT) Caryl Pina, MD    Next MD Visit "3 months"    Prior Therapy no      Precautions   Precautions [facility] Adult  PT Treatment/Exercise - 06/24/20 0001      Knee/Hip Exercises: Aerobic   Nustep L3 x17 min      Knee/Hip Exercises: Seated   Long Arc Quad Strengthening;Both;2 sets;10 reps    Long Arc Quad Weight 3 lbs.    Clamshell with TheraBand Red   x20 reps   Other Seated Knee/Hip Exercises Core crunch with theraball x10 reps 3 sec hlds    Hamstring Curl Strengthening;Both;2 sets;10 reps;Limitations    Hamstring Limitations red theraband      Knee/Hip Exercises: Supine   Bridges Strengthening;15 reps      Modalities   Modalities Electrical Stimulation;Moist Heat      Moist Heat Therapy   Number Minutes Moist Heat 15 Minutes    Moist Heat Location Lumbar Spine      Electrical Stimulation   Electrical Stimulation Location B low back    Electrical Stimulation Action Pre-Mod    Electrical Stimulation Parameters 80-150 hz x24min    Electrical Stimulation Goals Pain                       PT Long Term Goals - 06/14/20 2146      PT LONG TERM  GOAL #1   Title Patient will be independent with HEP    Time 6    Period Weeks    Status New      PT LONG TERM GOAL #2   Title Patient will demonstrate 4+/5 or greater bilateral LE MMT to improve stability during functional tasks    Time 6    Period Weeks    Status New      PT LONG TERM GOAL #3   Title Patient will improve functional LE strength as noted by the ability to perform modified 5x sit to stand test with UE support in 20 seconds or less.    Time 6    Period Weeks    Status New      PT LONG TERM GOAL #4   Title Patient report ability to walk in community for 10 mins or greater with no rest stops to improve cardiovascular and muscular endurance.    Time 6    Period Weeks    Status New      PT LONG TERM GOAL #5   Title Patient will reduce the risk of fall as noted by 5+ point improvement on the [facility] Balance Scale.    Time 6    Period Weeks    Status New                 Plan - 06/24/20 1611    Clinical Impression Statement Patient tolerated today's treatment fairly well as she was still experiencing chronic LBP and knee pain. Patient progressed to core strengthening in sitting today and continued LE strengthening. Normal modalities response noted following removal of the modalites for LBP.    Personal Factors and Comorbidities Comorbidity 3+;Time since onset of injury/illness/exacerbation    Comorbidities Anxiety, Arthritis, CHF, Depression, HTN, Osteoporosis, DM 2    Examination-Activity Limitations Locomotion Level;Transfers;Stairs;Stand    Examination-Participation Restrictions Laundry    Stability/Clinical Decision Making Stable/Uncomplicated    Rehab Potential Good    PT Frequency 2x / week    PT Duration 6 weeks    PT Treatment/Interventions ADLs/Self Care Home Management;Cryotherapy;Electrical Stimulation;Iontophoresis 4mg /ml Dexamethasone;Moist Heat;Ultrasound;Therapeutic exercise;Therapeutic activities;Functional mobility training;Balance  training;Neuromuscular re-education;Stair training;Gait training;Patient/family education;Passive range of motion;Manual techniques    PT Next Visit Plan Continue LE strengthening.  PT Home Exercise Plan see patient education section    Consulted and Agree with Plan of Care Patient           Patient will benefit from skilled therapeutic intervention in order to improve the following deficits and impairments:  Decreased activity tolerance, Decreased balance, Decreased strength, Decreased range of motion, Difficulty walking, Pain, Postural dysfunction  Visit Diagnosis: Muscle weakness (generalized)  Difficulty in walking, not elsewhere classified  Chronic right-sided low back pain, unspecified whether sciatica present     Problem List Patient Active Problem List   Diagnosis Date Noted  . Age-related osteoporosis with current pathological fracture 03/13/2019  . Chronic midline low back pain without sciatica 01/30/2019  . Age-related osteoporosis with current pathol fracture of vertebra ([place]) 10/28/2018  . Calcification of aorta (HCC) 10/02/2018  . Compression fracture of T12 vertebra ([place]) 10/01/2018  . Primary osteoarthritis of right knee 07/20/2017  . Meniscus degeneration, right 06/14/2017  . Varicose veins of left lower extremity with complications 42/39/5320  . Pain medication agreement signed 06/06/2016  . Gout attack 01/11/2016  . OSA (obstructive sleep apnea) 09/14/2015  . Chronic diastolic heart failure ([place]) 09/10/2015  . Orthopnea 09/02/2015  . Dysphagia, pharyngoesophageal phase 08/04/2015  . Anorexia 08/04/2015  . Fatigue 08/04/2015  . Malaise and fatigue 08/04/2015  . Depression, recurrent ([place]) 04/08/2015  . Osteoarthritis of left knee 09/30/2014  . Vitamin D deficiency   . Anxiety   . GERD (gastroesophageal reflux disease)   . Arthritis   . Chronic pain of multiple joints   . Hypertension associated with type 2 diabetes mellitus ([place]) 05/15/2013  .  Hyperlipidemia associated with type 2 diabetes mellitus ([place]) 05/15/2013  . Type 2 diabetes mellitus with hypercholesterolemia ([place]) 05/15/2013  . Morbid obesity ([place]) 05/15/2013  . Hypothyroidism 05/15/2013    Standley Brooking, PTA 06/24/2020, 4:25 PM  [facility] [address] [place], [place], [address] Phone: [phone]   Fax:  [phone]  Name: Danielle Parrish MRN: [id number] Date of Birth: [birth year]

## 2020-06-29 ENCOUNTER — Ambulatory Visit: Payer: Medicare Other | Admitting: Physical Therapy

## 2020-07-01 ENCOUNTER — Telehealth (INDEPENDENT_AMBULATORY_CARE_PROVIDER_SITE_OTHER): Payer: Medicare Other | Admitting: Family Medicine

## 2020-07-01 ENCOUNTER — Encounter: Payer: Self-pay | Admitting: Family Medicine

## 2020-07-01 ENCOUNTER — Ambulatory Visit: Payer: Medicare Other | Admitting: Physical Therapy

## 2020-07-01 ENCOUNTER — Other Ambulatory Visit: Payer: Self-pay

## 2020-07-01 ENCOUNTER — Encounter: Payer: Self-pay | Admitting: Physical Therapy

## 2020-07-01 DIAGNOSIS — M545 Low back pain, unspecified: Secondary | ICD-10-CM

## 2020-07-01 DIAGNOSIS — M6281 Muscle weakness (generalized): Secondary | ICD-10-CM | POA: Diagnosis not present

## 2020-07-01 DIAGNOSIS — Z9989 Dependence on other enabling machines and devices: Secondary | ICD-10-CM | POA: Diagnosis not present

## 2020-07-01 DIAGNOSIS — G8929 Other chronic pain: Secondary | ICD-10-CM

## 2020-07-01 DIAGNOSIS — R262 Difficulty in walking, not elsewhere classified: Secondary | ICD-10-CM

## 2020-07-01 DIAGNOSIS — G4733 Obstructive sleep apnea (adult) (pediatric): Secondary | ICD-10-CM | POA: Diagnosis not present

## 2020-07-01 NOTE — Therapy (Signed)
[facility] [place], [place], [address] Phone: [phone]   Fax:  [phone]  Physical Therapy Treatment  Patient Details  Name: Danielle Parrish MRN: [id number] Date of Birth: 06-18-1941 Referring Provider (PT): Caryl Pina, MD   Encounter Date: 07/01/2020   PT End of Session - 07/01/20 0813    Visit Number 5    Number of Visits 12    Date for PT Re-Evaluation 08/02/20    Authorization Type Progress note every 10th visit; KX modifier at 15th visit    PT Start Time 0731    PT Stop Time 0814    PT Time Calculation (min) 43 min    Activity Tolerance Patient tolerated treatment well    Behavior During Therapy [facility] for tasks assessed/performed           Past Medical History:  Diagnosis Date  . Anxiety   . Arthritis   . CHF (congestive heart failure) ([place])   . Chronic pain of multiple joints   . Depression   . Essential hypertension   . GERD (gastroesophageal reflux disease)   . Hyperlipidemia   . Hyperlipidemia associated with type 2 diabetes mellitus ([employer])   . Hypertension associated with type 2 diabetes mellitus ([place])   . Hypothyroidism   . Osteoporosis   . Sleep apnea    CPAP  . Type 2 diabetes mellitus with hypercholesterolemia ([place])   . Vitamin D deficiency     Past Surgical History:  Procedure Laterality Date  . ABDOMINAL HERNIA REPAIR    . ABDOMINAL HYSTERECTOMY  1977  . BREAST EXCISIONAL BIOPSY Right   . EYE SURGERY  2020   bilateral cataract removal   . KNEE ARTHROSCOPY Left 09/30/2014   Procedure: LEFT KNEE ARTHROSCOPY MEDIAL MENISECTOMY ABRASION CHONDROPLASTY SYNOVECTOMY SUPRAPATELLER CUFF;  Surgeon: Tobi Bastos, MD;  Location: [facility];  Service: Orthopedics;  Laterality: Left;    There were no vitals filed for this visit.   Subjective Assessment - 07/01/20 0737    Subjective COVID 19 screening performed on patient upon arrival. Patient reports feeling tired this morning. States  her back isn't hurting right now it's her right knee.    Pertinent History Anxiety, Arthritis, CHF, Depression, HTN, Osteoporosis, DM 2    Limitations House hold activities;Walking    Patient Stated Goals improve balance, move better along    Currently in Pain? Yes    Pain Score 0-No pain    Pain Score 7    Pain Location Knee    Pain Orientation Right    Pain Descriptors / Indicators Discomfort    Pain Onset More than a month ago    Pain Frequency Constant              [facility] PT Assessment - 07/01/20 0001      Assessment   Medical Diagnosis Weakness, At high risk for falls    Referring Provider (PT) Caryl Pina, MD    Next MD Visit "3 months"    Prior Therapy no                         [facility] Adult PT Treatment/Exercise - 07/01/20 0001      Knee/Hip Exercises: Aerobic   Nustep Level 1 x12 mins      Knee/Hip Exercises: Seated   Long Arc Quad Strengthening;Both;2 sets;10 reps    Long Arc Quad Weight 3 lbs.    Ball Squeeze x20 reps  Clamshell with TheraBand Red    Other Seated Knee/Hip Exercises Core crunch with theraball 2x10 reps 3 sec hlds    Marching Strengthening;Both;2 sets;10 reps    Marching Limitations red theraband    Hamstring Curl Strengthening;Both;2 sets;10 reps;Limitations    Hamstring Limitations red theraband    Sit to Sand 2 sets;10 reps;with UE support      Knee/Hip Exercises: Supine   Bridges Strengthening;20 reps    Straight Leg Raises AROM;Both;15 reps                       PT Long Term Goals - 07/01/20 0906      PT LONG TERM GOAL #1   Title Patient will be independent with HEP    Time 6    Period Weeks    Status On-going      PT LONG TERM GOAL #2   Title Patient will demonstrate 4+/5 or greater bilateral LE MMT to improve stability during functional tasks    Time 6    Period Weeks    Status On-going      PT LONG TERM GOAL #3   Title Patient will improve functional LE strength as noted by the ability  to perform modified 5x sit to stand test with UE support in 20 seconds or less.    Time 6    Period Weeks    Status On-going      PT LONG TERM GOAL #4   Title Patient report ability to walk in community for 10 mins or greater with no rest stops to improve cardiovascular and muscular endurance.    Time 6    Period Weeks    Status On-going      PT LONG TERM GOAL #5   Title Patient will reduce the risk of fall as noted by 5+ point improvement on the [facility] Balance Scale.    Time 6    Period Weeks    Status On-going                 Plan - 07/01/20 0909    Clinical Impression Statement Patient responded well to therapy session with no reports of increased pain in low back or right knee. Patient guided through TEs and patient demonstrated good form. Patient reported ongoing fatigue with ADLs and walking therefore goals ongoing at this time.    Personal Factors and Comorbidities Comorbidity 3+;Time since onset of injury/illness/exacerbation    Comorbidities Anxiety, Arthritis, CHF, Depression, HTN, Osteoporosis, DM 2    Examination-Activity Limitations Locomotion Level;Transfers;Stairs;Stand    Examination-Participation Restrictions Laundry    Stability/Clinical Decision Making Stable/Uncomplicated    Clinical Decision Making Low    Rehab Potential Good    PT Frequency 2x / week    PT Duration 6 weeks    PT Treatment/Interventions ADLs/Self Care Home Management;Cryotherapy;Electrical Stimulation;Iontophoresis 4mg /ml Dexamethasone;Moist Heat;Ultrasound;Therapeutic exercise;Therapeutic activities;Functional mobility training;Balance training;Neuromuscular re-education;Stair training;Gait training;Patient/family education;Passive range of motion;Manual techniques    PT Next Visit Plan Continue LE strengthening and core stability    PT Home Exercise Plan see patient education section    Consulted and Agree with Plan of Care Patient           Patient will benefit from skilled  therapeutic intervention in order to improve the following deficits and impairments:  Decreased activity tolerance, Decreased balance, Decreased strength, Decreased range of motion, Difficulty walking, Pain, Postural dysfunction  Visit Diagnosis: Muscle weakness (generalized)  Difficulty in walking, not elsewhere classified  Chronic right-sided  low back pain, unspecified whether sciatica present     Problem List Patient Active Problem List   Diagnosis Date Noted  . Age-related osteoporosis with current pathological fracture 03/13/2019  . Chronic midline low back pain without sciatica 01/30/2019  . Age-related osteoporosis with current pathol fracture of vertebra ([place]) 10/28/2018  . Calcification of aorta (HCC) 10/02/2018  . Compression fracture of T12 vertebra ([place]) 10/01/2018  . Primary osteoarthritis of right knee 07/20/2017  . Meniscus degeneration, right 06/14/2017  . Varicose veins of left lower extremity with complications 13/64/3837  . Pain medication agreement signed 06/06/2016  . Gout attack 01/11/2016  . OSA (obstructive sleep apnea) 09/14/2015  . Chronic diastolic heart failure ([place]) 09/10/2015  . Orthopnea 09/02/2015  . Dysphagia, pharyngoesophageal phase 08/04/2015  . Anorexia 08/04/2015  . Fatigue 08/04/2015  . Malaise and fatigue 08/04/2015  . Depression, recurrent ([place]) 04/08/2015  . Osteoarthritis of left knee 09/30/2014  . Vitamin D deficiency   . Anxiety   . GERD (gastroesophageal reflux disease)   . Arthritis   . Chronic pain of multiple joints   . Hypertension associated with type 2 diabetes mellitus ([place]) 05/15/2013  . Hyperlipidemia associated with type 2 diabetes mellitus ([place]) 05/15/2013  . Type 2 diabetes mellitus with hypercholesterolemia ([place]) 05/15/2013  . Morbid obesity ([place]) 05/15/2013  . Hypothyroidism 05/15/2013    Gabriela Eves, PT, DPT 07/01/2020, 9:12 AM  [facility] [place], [place], [address] Phone: [phone]   Fax:  [phone]  Name: Norely Schlick MRN: [id number] Date of Birth: 08/30/1941

## 2020-07-01 NOTE — Progress Notes (Addendum)
PATIENT: Danielle Parrish DOB: [birth year]  REASON FOR VISIT: follow up HISTORY FROM: patient  Virtual Visit via Telephone Note  I connected with Danielle Parrish on 07/01/20 at  1:30 PM EDT by telephone and verified that I am speaking with the correct person using two identifiers.   I discussed the limitations, risks, security and privacy concerns of performing an evaluation and management service by telephone and the availability of in person appointments. I also discussed with the patient that there may be a patient responsible charge related to this service. The patient expressed understanding and agreed to proceed.   History of Present Illness:  07/01/20 Danielle Parrish is a 79 y.o. female here today for follow up  for OSA on CPAP.  She reports that she is doing well with CPAP therapy.  She does admit to missing some days of therapy due to not cleaning her machine.  She has also noted a leak around her mask.  She is using a nasal pillow.  She feels that she is doing well otherwise.  Compliance report dated 05/31/2020-06/29/2020 reveals that she used CPAP 20 one of the past 30 days for compliance of 70%.  She used CPAP greater than 4 hours 20 one of the last 30 days for compliance of 70%.  Average usage on days used was 7 hours and 24 minutes.  Residual AHI was 2.5 on a set pressure of 13 cm of water and an EPR of 3.  There was a significant leak noted in the 95th percentile of 38.5.   HISTORY: (copied from Dr Guadelupe Sabin note on 01/01/2020)  Dear Danielle Parrish,   I saw your patient, Danielle Parrish, upon your kind request in my sleep clinic today for initial consultation of her sleep disorder, in particular, evaluation of her prior diagnosis of obstructive sleep apnea.  The patient is unaccompanied today.  As you know, Danielle Parrish is a 79 year old right-handed woman with an underlying medical history of hypertension, hyperlipidemia, reflux disease, depression, anxiety, congestive heart failure,  hypothyroidism, osteoporosis, vitamin D deficiency, diabetes and obesity, who was previously diagnosed with obstructive sleep apnea and placed on CPAP therapy.  She had sleep study testing in 2004, I was able to review her split-night sleep study from 10/14/2003 which indicated severe obstructive sleep apnea with a baseline AHI of 103.8, O2 nadir of 83%.  She was advised to start CPAP therapy at 13 cm.  A CPAP compliance download was reviewed today from 12/03/2019 through 01/01/2020, which is a total of 30 days, during which time she used her machine only 15 days with percent use days greater than 4 hours at 43%, indicating suboptimal compliance with an average usage for days on treatment of 7 hours and 44 minutes, residual AHI at goal at 3.3/h, but leak high with a 95th percentile at 75.7 L/min on a pressure of 13 cm with EPR of 3. I reviewed your telemedicine note from 07/15/2019.  Her Epworth sleepiness score is 8 out of 24, fatigue severity score is 45 out of 63.  Her daughter lives with her, patient has another daughter in [place], [place].  She is a non-smoker and does not drink alcohol and drinks caffeine in the form of soda, about 10 ounce per day but sometimes more.  She reports that she does not always use her CPAP when she stays with her mother, she stays with her 95 year old mother and does not always know how long she will stay with her, she does not take  her machine with her.  She uses nasal pillows and generally speaking gets her supplies on a 6 monthly basis from [employer].  She has been changing her filter on a regular basis.  She reports no issues using her machine and it looks like it was set up in 2017.  She reports a late bedtime of around 1 or 2 AM.  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  Rise time is generally between 10 and 1030.    Observations/Objective:   Mentation: Alert, oriented to time, place, history taking. Speech and language fluent   Assessment and  Plan:  79 y.o. year old female  has a past medical history of Anxiety, Arthritis, CHF (congestive heart failure) ([place]), Chronic pain of multiple joints, Depression, Essential hypertension, GERD (gastroesophageal reflux disease), Hyperlipidemia, Hyperlipidemia associated with type 2 diabetes mellitus ([place]), Hypertension associated with type 2 diabetes mellitus ([place]), Hypothyroidism, Osteoporosis, Sleep apnea, Type 2 diabetes mellitus with hypercholesterolemia ([place]), and Vitamin D deficiency. here with    ICD-10-CM   1. OSA on CPAP  G47.33    Z99.89    Danielle Parrish is doing well on CPAP therapy.  She does admit to not using her machine's on the days that she cannot clean it.  We have discussed her compliance report which reveals acceptable compliance.  We have reviewed insurance requirements of meeting a minimum of 70% compliance.  She was encouraged to use CPAP nightly and for greater than 4 hours each night.  She will continue to clean her machine once weekly.  We have discussed concerns of a leak.  She has noted this at home and will work on tightening her headgear.  She is comfortable with her current mask.  She will continue healthy lifestyle habits.  I have encouraged her to follow-up with [place] in 6 months, sooner if needed.  She verbalizes understanding and agreement this plan.   No orders of the defined types were placed in this encounter.   No orders of the defined types were placed in this encounter.    Follow Up Instructions:  I discussed the assessment and treatment plan with the patient. The patient was provided an opportunity to ask questions and all were answered. The patient agreed with the plan and demonstrated an understanding of the instructions.   The patient was advised to call back or seek an in-person evaluation if the symptoms worsen or if the condition fails to improve as anticipated.  I provided 11 minutes of non-face-to-face time during this encounter.  Patient is located at  her place of residence during [place].  Providers in the office   Debbora Presto, NP   I reviewed the above note and documentation by the Nurse Practitioner and agree with the history, limited VV exam, assessment and plan as outlined above. I was available for consultation. Star Age, MD, PhD [facility])

## 2020-07-02 ENCOUNTER — Ambulatory Visit: Payer: Medicare Other | Admitting: Physical Therapy

## 2020-07-05 ENCOUNTER — Other Ambulatory Visit: Payer: Self-pay | Admitting: Family Medicine

## 2020-07-05 DIAGNOSIS — F339 Major depressive disorder, recurrent, unspecified: Secondary | ICD-10-CM

## 2020-07-06 ENCOUNTER — Ambulatory Visit: Payer: Medicare Other | Admitting: Physical Therapy

## 2020-07-07 ENCOUNTER — Ambulatory Visit: Payer: Medicare Other | Admitting: Physical Therapy

## 2020-07-07 ENCOUNTER — Other Ambulatory Visit: Payer: Self-pay

## 2020-07-07 ENCOUNTER — Encounter: Payer: Self-pay | Admitting: Physical Therapy

## 2020-07-07 DIAGNOSIS — M545 Low back pain, unspecified: Secondary | ICD-10-CM

## 2020-07-07 DIAGNOSIS — G8929 Other chronic pain: Secondary | ICD-10-CM | POA: Diagnosis not present

## 2020-07-07 DIAGNOSIS — R262 Difficulty in walking, not elsewhere classified: Secondary | ICD-10-CM

## 2020-07-07 DIAGNOSIS — M6281 Muscle weakness (generalized): Secondary | ICD-10-CM | POA: Diagnosis not present

## 2020-07-07 NOTE — Therapy (Signed)
[facility] [place], [place], [address] Phone: [phone]   Fax:  [phone]  Physical Therapy Treatment  Patient Details  Name: Danielle Parrish MRN: [id number] Date of Birth: 30-Jul-1941 Referring Provider (PT): Caryl Pina, MD   Encounter Date: 07/07/2020   PT End of Session - 07/07/20 1036    Visit Number 6    Number of Visits 12    Date for PT Re-Evaluation 08/02/20    Authorization Type Progress note every 10th visit; KX modifier at 15th visit    PT Start Time 0948    PT Stop Time 1030    PT Time Calculation (min) 42 min    Activity Tolerance Patient tolerated treatment well    Behavior During Therapy [facility] for tasks assessed/performed           Past Medical History:  Diagnosis Date  . Anxiety   . Arthritis   . CHF (congestive heart failure) ([place])   . Chronic pain of multiple joints   . Depression   . Essential hypertension   . GERD (gastroesophageal reflux disease)   . Hyperlipidemia   . Hyperlipidemia associated with type 2 diabetes mellitus ([place])   . Hypertension associated with type 2 diabetes mellitus ([place])   . Hypothyroidism   . Osteoporosis   . Sleep apnea    CPAP  . Type 2 diabetes mellitus with hypercholesterolemia ([place])   . Vitamin D deficiency     Past Surgical History:  Procedure Laterality Date  . ABDOMINAL HERNIA REPAIR    . ABDOMINAL HYSTERECTOMY  1977  . BREAST EXCISIONAL BIOPSY Right   . EYE SURGERY  2020   bilateral cataract removal   . KNEE ARTHROSCOPY Left 09/30/2014   Procedure: LEFT KNEE ARTHROSCOPY MEDIAL MENISECTOMY ABRASION CHONDROPLASTY SYNOVECTOMY SUPRAPATELLER CUFF;  Surgeon: Tobi Bastos, MD;  Location: [facility];  Service: Orthopedics;  Laterality: Left;    There were no vitals filed for this visit.   Subjective Assessment - 07/07/20 0949    Subjective COVID 19 screening performed on patient upon arrival. Patient reports LBP today but no knee pain.     Pertinent History Anxiety, Arthritis, CHF, Depression, HTN, Osteoporosis, DM 2    Limitations House hold activities;Walking    Patient Stated Goals improve balance, move better along    Currently in Pain? Yes    Pain Score 5     Pain Location Back    Pain Orientation Lower    Pain Descriptors / Indicators Discomfort    Pain Type Chronic pain    Pain Onset More than a month ago    Pain Frequency Constant              [facility] PT Assessment - 07/07/20 0001      Assessment   Medical Diagnosis Weakness, At high risk for falls    Referring Provider (PT) Caryl Pina, MD    Next MD Visit "3 months"    Prior Therapy no                         [facility] Adult PT Treatment/Exercise - 07/07/20 0001      Exercises   Exercises Lumbar      Lumbar Exercises: Seated   Hip Flexion on Ball AROM;Both;15 reps    Other Seated Lumbar Exercises Core crunch with ball x15 reps 5 sec holds; B row, ext red theraband x20 reps each    Other Seated Lumbar Exercises Red  ball reachouts, B D2 with red ball with core activation x20 reps each      Knee/Hip Exercises: Aerobic   Nustep L4 x15 min      Knee/Hip Exercises: Seated   Long Arc Quad Strengthening;Both;2 sets;10 reps    Long Arc Quad Weight 4 lbs.    Clamshell with TheraBand Red   x20 reps   Sit to Sand 2 sets;10 reps;without UE support   intermittant VCs for technique                      PT Long Term Goals - 07/01/20 0906      PT LONG TERM GOAL #1   Title Patient will be independent with HEP    Time 6    Period Weeks    Status On-going      PT LONG TERM GOAL #2   Title Patient will demonstrate 4+/5 or greater bilateral LE MMT to improve stability during functional tasks    Time 6    Period Weeks    Status On-going      PT LONG TERM GOAL #3   Title Patient will improve functional LE strength as noted by the ability to perform modified 5x sit to stand test with UE support in 20 seconds or less.    Time 6     Period Weeks    Status On-going      PT LONG TERM GOAL #4   Title Patient report ability to walk in community for 10 mins or greater with no rest stops to improve cardiovascular and muscular endurance.    Time 6    Period Weeks    Status On-going      PT LONG TERM GOAL #5   Title Patient will reduce the risk of fall as noted by 5+ point improvement on the [facility] Balance Scale.    Time 6    Period Weeks    Status On-going                 Plan - 07/07/20 1037    Clinical Impression Statement Patient presented in clinic with reports of mid level LBP. Patient able to demonstrate better technique and ability with sit <> stands after VCs to sit on edge of chair. Patient progressed to more seated core and LE strengthening with patient reported no LBP. No modalities per no pain. Patient reported fatigue at end of session.    Personal Factors and Comorbidities Comorbidity 3+;Time since onset of injury/illness/exacerbation    Comorbidities Anxiety, Arthritis, CHF, Depression, HTN, Osteoporosis, DM 2    Examination-Activity Limitations Locomotion Level;Transfers;Stairs;Stand    Examination-Participation Restrictions Laundry    Stability/Clinical Decision Making Stable/Uncomplicated    Rehab Potential Good    PT Frequency 2x / week    PT Duration 6 weeks    PT Treatment/Interventions ADLs/Self Care Home Management;Cryotherapy;Electrical Stimulation;Iontophoresis 4mg /ml Dexamethasone;Moist Heat;Ultrasound;Therapeutic exercise;Therapeutic activities;Functional mobility training;Balance training;Neuromuscular re-education;Stair training;Gait training;Patient/family education;Passive range of motion;Manual techniques    PT Next Visit Plan Continue LE strengthening and core stability    PT Home Exercise Plan see patient education section    Consulted and Agree with Plan of Care Patient           Patient will benefit from skilled therapeutic intervention in order to improve the following  deficits and impairments:  Decreased activity tolerance, Decreased balance, Decreased strength, Decreased range of motion, Difficulty walking, Pain, Postural dysfunction  Visit Diagnosis: Muscle weakness (generalized)  Difficulty in walking, not  elsewhere classified  Chronic right-sided low back pain, unspecified whether sciatica present     Problem List Patient Active Problem List   Diagnosis Date Noted  . Age-related osteoporosis with current pathological fracture 03/13/2019  . Chronic midline low back pain without sciatica 01/30/2019  . Age-related osteoporosis with current pathol fracture of vertebra ([employer]) 10/28/2018  . Calcification of aorta (HCC) 10/02/2018  . Compression fracture of T12 vertebra ([place]) 10/01/2018  . Primary osteoarthritis of right knee 07/20/2017  . Meniscus degeneration, right 06/14/2017  . Varicose veins of left lower extremity with complications 21/82/8833  . Pain medication agreement signed 06/06/2016  . Gout attack 01/11/2016  . OSA (obstructive sleep apnea) 09/14/2015  . Chronic diastolic heart failure ([place]) 09/10/2015  . Orthopnea 09/02/2015  . Dysphagia, pharyngoesophageal phase 08/04/2015  . Anorexia 08/04/2015  . Fatigue 08/04/2015  . Malaise and fatigue 08/04/2015  . Depression, recurrent ([place]) 04/08/2015  . Osteoarthritis of left knee 09/30/2014  . Vitamin D deficiency   . Anxiety   . GERD (gastroesophageal reflux disease)   . Arthritis   . Chronic pain of multiple joints   . Hypertension associated with type 2 diabetes mellitus ([place]) 05/15/2013  . Hyperlipidemia associated with type 2 diabetes mellitus ([place]) 05/15/2013  . Type 2 diabetes mellitus with hypercholesterolemia ([place]) 05/15/2013  . Morbid obesity ([place]) 05/15/2013  . Hypothyroidism 05/15/2013    Standley Brooking, PTA 07/07/2020, 11:11 AM  [facility] [address] [place], [place], [address] Phone: [phone]   Fax:   [phone]  Name: Navika Hoopes MRN: [id number] Date of Birth: 03/02/1941

## 2020-07-09 ENCOUNTER — Encounter: Payer: Medicare Other | Admitting: Physical Therapy

## 2020-07-12 ENCOUNTER — Other Ambulatory Visit: Payer: Self-pay

## 2020-07-12 ENCOUNTER — Ambulatory Visit: Payer: Medicare Other | Admitting: Physical Therapy

## 2020-07-12 ENCOUNTER — Encounter: Payer: Self-pay | Admitting: Physical Therapy

## 2020-07-12 DIAGNOSIS — G8929 Other chronic pain: Secondary | ICD-10-CM

## 2020-07-12 DIAGNOSIS — M545 Low back pain, unspecified: Secondary | ICD-10-CM

## 2020-07-12 DIAGNOSIS — M6281 Muscle weakness (generalized): Secondary | ICD-10-CM | POA: Diagnosis not present

## 2020-07-12 DIAGNOSIS — R262 Difficulty in walking, not elsewhere classified: Secondary | ICD-10-CM | POA: Diagnosis not present

## 2020-07-12 NOTE — Therapy (Signed)
[facility] [place], [place], [address] Phone: [phone]   Fax:  [phone]  Physical Therapy Treatment  Patient Details  Name: Danielle Parrish MRN: [id number] Date of Birth: 1940-12-27 Referring Provider (PT): Caryl Pina, MD   Encounter Date: 07/12/2020   PT End of Session - 07/12/20 1046    Visit Number 7    Number of Visits 12    Date for PT Re-Evaluation 08/02/20    Authorization Type Progress note every 10th visit; KX modifier at 15th visit    PT Start Time 1031    PT Stop Time 1112    PT Time Calculation (min) 41 min    Activity Tolerance Patient tolerated treatment well    Behavior During Therapy [facility] for tasks assessed/performed           Past Medical History:  Diagnosis Date  . Anxiety   . Arthritis   . CHF (congestive heart failure) ([place])   . Chronic pain of multiple joints   . Depression   . Essential hypertension   . GERD (gastroesophageal reflux disease)   . Hyperlipidemia   . Hyperlipidemia associated with type 2 diabetes mellitus ([place])   . Hypertension associated with type 2 diabetes mellitus ([place])   . Hypothyroidism   . Osteoporosis   . Sleep apnea    CPAP  . Type 2 diabetes mellitus with hypercholesterolemia ([place])   . Vitamin D deficiency     Past Surgical History:  Procedure Laterality Date  . ABDOMINAL HERNIA REPAIR    . ABDOMINAL HYSTERECTOMY  1977  . BREAST EXCISIONAL BIOPSY Right   . EYE SURGERY  2020   bilateral cataract removal   . KNEE ARTHROSCOPY Left 09/30/2014   Procedure: LEFT KNEE ARTHROSCOPY MEDIAL MENISECTOMY ABRASION CHONDROPLASTY SYNOVECTOMY SUPRAPATELLER CUFF;  Surgeon: Tobi Bastos, MD;  Location: [facility];  Service: Orthopedics;  Laterality: Left;    There were no vitals filed for this visit.   Subjective Assessment - 07/12/20 1025    Subjective COVID 19 screening performed on patient upon arrival. Patient reports LBP today but no knee pain.     Pertinent History Anxiety, Arthritis, CHF, Depression, HTN, Osteoporosis, DM 2    Limitations House hold activities;Walking    Patient Stated Goals improve balance, move better along    Currently in Pain? Yes    Pain Score 6     Pain Location Flank    Pain Orientation Right    Pain Descriptors / Indicators Discomfort    Pain Type Chronic pain    Pain Onset More than a month ago    Pain Frequency Constant              [facility] PT Assessment - 07/12/20 0001      Assessment   Medical Diagnosis Weakness, At high risk for falls    Referring Provider (PT) Caryl Pina, MD    Next MD Visit "3 months"    Prior Therapy no      Precautions   Precautions [facility] Adult PT Treatment/Exercise - 07/12/20 0001      Lumbar Exercises: Standing   Row Strengthening;Both;20 reps;Limitations    Row Limitations green XTS    Shoulder Extension Strengthening;Both;20 reps;Limitations    Shoulder Extension Limitations green XTS      Knee/Hip Exercises: Aerobic   Nustep L4 x15  min      Knee/Hip Exercises: Standing   Heel Raises Both;20 reps    Hip Flexion AROM;Both;15 reps;Knee bent    Hip Abduction AROM;Both;20 reps;Knee straight    Forward Step Up Both;20 reps;Hand Hold: 2;Step Height: 6"      Knee/Hip Exercises: Seated   Long Arc Quad Strengthening;Both;2 sets;10 reps    Long Arc Quad Weight 4 lbs.    Clamshell with TheraBand Green   x20 reps   Hamstring Curl Strengthening;Both;20 reps;Limitations    Hamstring Limitations green theraband    Sit to Sand 5 reps;without UE support                       PT Long Term Goals - 07/12/20 1057      PT LONG TERM GOAL #1   Title Patient will be independent with HEP    Time 6    Period Weeks    Status On-going      PT LONG TERM GOAL #2   Title Patient will demonstrate 4+/5 or greater bilateral LE MMT to improve stability during functional tasks    Time 6    Period Weeks    Status  On-going      PT LONG TERM GOAL #3   Title Patient will improve functional LE strength as noted by the ability to perform modified 5x sit to stand test with UE support in 20 seconds or less.    Time 6    Period Weeks    Status Achieved   Met in 15 seconds 07/12/2020     PT LONG TERM GOAL #4   Title Patient report ability to walk in community for 10 mins or greater with no rest stops to improve cardiovascular and muscular endurance.    Time 6    Period Weeks    Status Achieved   Uses cart if in a store     PT LONG TERM GOAL #5   Title Patient will reduce the risk of fall as noted by 5+ point improvement on the [facility] Balance Scale.    Time 6    Period Weeks    Status On-going                 Plan - 07/12/20 1114    Clinical Impression Statement Patient presented in clinic with no complaints of LBP or knee pain. Patient did present in clinic with R flank pain. Patient able to achieve two LTGs such as walking for 10 minutes and sit<>stand goals. Patient progressed to more standing lumbar/core and LE strengthening with only reports of fatigue at end of treatment.    Personal Factors and Comorbidities Comorbidity 3+;Time since onset of injury/illness/exacerbation    Comorbidities Anxiety, Arthritis, CHF, Depression, HTN, Osteoporosis, DM 2    Examination-Activity Limitations Locomotion Level;Transfers;Stairs;Stand    Examination-Participation Restrictions Laundry    Stability/Clinical Decision Making Stable/Uncomplicated    Rehab Potential Good    PT Frequency 2x / week    PT Duration 6 weeks    PT Treatment/Interventions ADLs/Self Care Home Management;Cryotherapy;Electrical Stimulation;Iontophoresis 70m/ml Dexamethasone;Moist Heat;Ultrasound;Therapeutic exercise;Therapeutic activities;Functional mobility training;Balance training;Neuromuscular re-education;Stair training;Gait training;Patient/family education;Passive range of motion;Manual techniques    PT Next Visit Plan Continue LE  strengthening and core stability    PT Home Exercise Plan see patient education section    Consulted and Agree with Plan of Care Patient           Patient will benefit from skilled therapeutic intervention in order  to improve the following deficits and impairments:  Decreased activity tolerance, Decreased balance, Decreased strength, Decreased range of motion, Difficulty walking, Pain, Postural dysfunction  Visit Diagnosis: Muscle weakness (generalized)  Difficulty in walking, not elsewhere classified  Chronic right-sided low back pain, unspecified whether sciatica present     Problem List Patient Active Problem List   Diagnosis Date Noted  . Age-related osteoporosis with current pathological fracture 03/13/2019  . Chronic midline low back pain without sciatica 01/30/2019  . Age-related osteoporosis with current pathol fracture of vertebra ([place]) 10/28/2018  . Calcification of aorta (HCC) 10/02/2018  . Compression fracture of T12 vertebra ([place]) 10/01/2018  . Primary osteoarthritis of right knee 07/20/2017  . Meniscus degeneration, right 06/14/2017  . Varicose veins of left lower extremity with complications 44/81/8563  . Pain medication agreement signed 06/06/2016  . Gout attack 01/11/2016  . OSA (obstructive sleep apnea) 09/14/2015  . Chronic diastolic heart failure ([place]) 09/10/2015  . Orthopnea 09/02/2015  . Dysphagia, pharyngoesophageal phase 08/04/2015  . Anorexia 08/04/2015  . Fatigue 08/04/2015  . Malaise and fatigue 08/04/2015  . Depression, recurrent ([place]) 04/08/2015  . Osteoarthritis of left knee 09/30/2014  . Vitamin D deficiency   . Anxiety   . GERD (gastroesophageal reflux disease)   . Arthritis   . Chronic pain of multiple joints   . Hypertension associated with type 2 diabetes mellitus ([place]) 05/15/2013  . Hyperlipidemia associated with type 2 diabetes mellitus ([place]) 05/15/2013  . Type 2 diabetes mellitus with hypercholesterolemia ([place]) 05/15/2013  .  Morbid obesity ([place]) 05/15/2013  . Hypothyroidism 05/15/2013    Standley Brooking, PTA 07/12/2020, 11:23 AM  [facility] [address] [place], [place], [address] Phone: [phone]   Fax:  [phone]  Name: Danielle Parrish MRN: [id number] Date of Birth: [birth year]

## 2020-07-14 ENCOUNTER — Other Ambulatory Visit: Payer: Self-pay

## 2020-07-14 ENCOUNTER — Ambulatory Visit: Payer: Medicare Other | Admitting: Physical Therapy

## 2020-07-14 DIAGNOSIS — M6281 Muscle weakness (generalized): Secondary | ICD-10-CM | POA: Diagnosis not present

## 2020-07-14 DIAGNOSIS — R262 Difficulty in walking, not elsewhere classified: Secondary | ICD-10-CM | POA: Diagnosis not present

## 2020-07-14 DIAGNOSIS — G8929 Other chronic pain: Secondary | ICD-10-CM

## 2020-07-14 DIAGNOSIS — M545 Low back pain, unspecified: Secondary | ICD-10-CM

## 2020-07-14 NOTE — Therapy (Signed)
[facility] [place], [place], [address] Phone: [phone]   Fax:  [phone]  Physical Therapy Treatment  Patient Details  Name: Danielle Parrish MRN: [id number] Date of Birth: Apr 20, 1941 Referring Provider (PT): Caryl Pina, MD   Encounter Date: 07/14/2020   PT End of Session - 07/14/20 1122    Visit Number 8    Number of Visits 12    Date for PT Re-Evaluation 08/02/20    Authorization Type Progress note every 10th visit; KX modifier at 15th visit    PT Start Time 1115    PT Stop Time 1156    PT Time Calculation (min) 41 min    Activity Tolerance Patient tolerated treatment well    Behavior During Therapy [facility] for tasks assessed/performed           Past Medical History:  Diagnosis Date  . Anxiety   . Arthritis   . CHF (congestive heart failure) ([place])   . Chronic pain of multiple joints   . Depression   . Essential hypertension   . GERD (gastroesophageal reflux disease)   . Hyperlipidemia   . Hyperlipidemia associated with type 2 diabetes mellitus ([place])   . Hypertension associated with type 2 diabetes mellitus ([place])   . Hypothyroidism   . Osteoporosis   . Sleep apnea    CPAP  . Type 2 diabetes mellitus with hypercholesterolemia ([place])   . Vitamin D deficiency     Past Surgical History:  Procedure Laterality Date  . ABDOMINAL HERNIA REPAIR    . ABDOMINAL HYSTERECTOMY  1977  . BREAST EXCISIONAL BIOPSY Right   . EYE SURGERY  2020   bilateral cataract removal   . KNEE ARTHROSCOPY Left 09/30/2014   Procedure: LEFT KNEE ARTHROSCOPY MEDIAL MENISECTOMY ABRASION CHONDROPLASTY SYNOVECTOMY SUPRAPATELLER CUFF;  Surgeon: Tobi Bastos, MD;  Location: [facility];  Service: Orthopedics;  Laterality: Left;    There were no vitals filed for this visit.   Subjective Assessment - 07/14/20 1115    Subjective COVID 19 screening performed on patient upon arrival. Patient reported ongoing discomfort today.     Pertinent History Anxiety, Arthritis, CHF, Depression, HTN, Osteoporosis, DM 2    Limitations House hold activities;Walking    Patient Stated Goals improve balance, move better along    Currently in Pain? Yes    Pain Score 8     Pain Location Flank    Pain Orientation Right    Pain Descriptors / Indicators Discomfort    Pain Type Chronic pain    Pain Onset More than a month ago    Pain Frequency Constant    Aggravating Factors  unsure of trigger    Pain Relieving Factors meds                             [facility] Adult PT Treatment/Exercise - 07/14/20 0001      Berg Balance Test   Sit to Stand Able to stand without using hands and stabilize independently    Standing Unsupported Able to stand safely 2 minutes    Sitting with Back Unsupported but Feet Supported on Floor or Stool Able to sit safely and securely 2 minutes    Stand to Sit Sits safely with minimal use of hands    Transfers Able to transfer safely, minor use of hands    Standing Unsupported with Eyes Closed Able to stand 10 seconds safely    Standing Ubsupported  with Feet Together Able to place feet together independently and stand 1 minute safely    From Standing, Reach Forward with Outstretched Arm Can reach forward >12 cm safely (5")    From Standing Position, Pick up Object from [place] to pick up shoe safely and easily    From Standing Position, Turn to Look Behind Over each Shoulder Looks behind one side only/other side shows less weight shift    Turn 360 Degrees Able to turn 360 degrees safely in 4 seconds or less    Standing Unsupported, Alternately Place Feet on Step/Stool Able to stand independently and complete 8 steps >20 seconds    Standing Unsupported, One Foot in Front Able to place foot tandem independently and hold 30 seconds    Standing on One Leg Able to lift leg independently and hold 5-10 seconds    Total Score 52      Knee/Hip Exercises: Aerobic   Nustep L3 x15 min UE/LE, monitored       Knee/Hip Exercises: Standing   Heel Raises Both;20 reps      Knee/Hip Exercises: Seated   Long Arc Quad Strengthening;Both;2 sets;10 reps    Long Arc Quad Weight 4 lbs.    Hamstring Curl Strengthening;Both;20 reps;Limitations    Hamstring Limitations green theraband    Sit to Sand 10 reps;without UE support      Knee/Hip Exercises: Supine   Bridges Strengthening;20 reps    Straight Leg Raises Strengthening;Both;15 reps    Other Supine Knee/Hip Exercises clamshell x20 w green band                       PT Long Term Goals - 07/14/20 1129      PT LONG TERM GOAL #1   Title Patient will be independent with HEP    Time 6    Period Weeks    Status On-going      PT LONG TERM GOAL #2   Title Patient will demonstrate 4+/5 or greater bilateral LE MMT to improve stability during functional tasks    Time 6    Period Weeks    Status On-going      PT LONG TERM GOAL #3   Title Patient will improve functional LE strength as noted by the ability to perform modified 5x sit to stand test with UE support in 20 seconds or less.    Baseline Met 07/12/20    Time 6    Period Weeks    Status Achieved      PT LONG TERM GOAL #4   Title Patient report ability to walk in community for 10 mins or greater with no rest stops to improve cardiovascular and muscular endurance.    Baseline Met 07/12/20    Time 6    Period Weeks    Status Achieved      PT LONG TERM GOAL #5   Title Patient will reduce the risk of fall as noted by 5+ point improvement on the [facility] Balance Scale.    Baseline increased 1 point today to 52/56 07/14/20    Time 6    Period Weeks    Status On-going                 Plan - 07/14/20 1156    Clinical Impression Statement Patient tolerated treatment well today. Patient has reported overall improvement with ADL's and balance although she continues to have limitations with flank discomfort and knee pain that comes and goes.  Patient is able to do yard work and  garden work with greater ease and she does her stationary bike daily. Patient goals are progresing this week.    Personal Factors and Comorbidities Comorbidity 3+;Time since onset of injury/illness/exacerbation    Comorbidities Anxiety, Arthritis, CHF, Depression, HTN, Osteoporosis, DM 2    Examination-Activity Limitations Locomotion Level;Transfers;Stairs;Stand    Examination-Participation Restrictions Laundry    Stability/Clinical Decision Making Stable/Uncomplicated    Rehab Potential Good    PT Frequency 2x / week    PT Duration 6 weeks    PT Treatment/Interventions ADLs/Self Care Home Management;Cryotherapy;Electrical Stimulation;Iontophoresis 35m/ml Dexamethasone;Moist Heat;Ultrasound;Therapeutic exercise;Therapeutic activities;Functional mobility training;Balance training;Neuromuscular re-education;Stair training;Gait training;Patient/family education;Passive range of motion;Manual techniques    PT Next Visit Plan Continue LE strengthening and core stability    Consulted and Agree with Plan of Care Patient           Patient will benefit from skilled therapeutic intervention in order to improve the following deficits and impairments:  Decreased activity tolerance, Decreased balance, Decreased strength, Decreased range of motion, Difficulty walking, Pain, Postural dysfunction  Visit Diagnosis: Muscle weakness (generalized)  Difficulty in walking, not elsewhere classified  Chronic right-sided low back pain, unspecified whether sciatica present     Problem List Patient Active Problem List   Diagnosis Date Noted  . Age-related osteoporosis with current pathological fracture 03/13/2019  . Chronic midline low back pain without sciatica 01/30/2019  . Age-related osteoporosis with current pathol fracture of vertebra ([place] 10/28/2018  . Calcification of aorta ([facility]) 10/02/2018  . Compression fracture of T12 vertebra ([place] 10/01/2018  . Primary osteoarthritis of right knee 07/20/2017   . Meniscus degeneration, right 06/14/2017  . Varicose veins of left lower extremity with complications 079/48/0165 . Pain medication agreement signed 06/06/2016  . Gout attack 01/11/2016  . OSA (obstructive sleep apnea) 09/14/2015  . Chronic diastolic heart failure ([place] 09/10/2015  . Orthopnea 09/02/2015  . Dysphagia, pharyngoesophageal phase 08/04/2015  . Anorexia 08/04/2015  . Fatigue 08/04/2015  . Malaise and fatigue 08/04/2015  . Depression, recurrent ([place] 04/08/2015  . Osteoarthritis of left knee 09/30/2014  . Vitamin D deficiency   . Anxiety   . GERD (gastroesophageal reflux disease)   . Arthritis   . Chronic pain of multiple joints   . Hypertension associated with type 2 diabetes mellitus ([place] 05/15/2013  . Hyperlipidemia associated with type 2 diabetes mellitus ([place] 05/15/2013  . Type 2 diabetes mellitus with hypercholesterolemia ([place] 05/15/2013  . Morbid obesity ([place] 05/15/2013  . Hypothyroidism 05/15/2013    DPhillips Climes PTA 07/14/2020, 12:02 PM  [facility] [address] [address]Phone: [phone]  Fax:  [phone] Name: RFareeha EvonMRN: [id number] of Birth: [birth year]

## 2020-07-20 DIAGNOSIS — H43811 Vitreous degeneration, right eye: Secondary | ICD-10-CM | POA: Diagnosis not present

## 2020-07-20 DIAGNOSIS — H04123 Dry eye syndrome of bilateral lacrimal glands: Secondary | ICD-10-CM | POA: Diagnosis not present

## 2020-07-20 DIAGNOSIS — H26493 Other secondary cataract, bilateral: Secondary | ICD-10-CM | POA: Diagnosis not present

## 2020-07-27 ENCOUNTER — Encounter: Payer: Self-pay | Admitting: Physical Therapy

## 2020-07-27 ENCOUNTER — Ambulatory Visit: Payer: Medicare Other | Attending: Family Medicine | Admitting: Physical Therapy

## 2020-07-27 ENCOUNTER — Other Ambulatory Visit: Payer: Self-pay

## 2020-07-27 DIAGNOSIS — G8929 Other chronic pain: Secondary | ICD-10-CM | POA: Diagnosis not present

## 2020-07-27 DIAGNOSIS — M6281 Muscle weakness (generalized): Secondary | ICD-10-CM

## 2020-07-27 DIAGNOSIS — R262 Difficulty in walking, not elsewhere classified: Secondary | ICD-10-CM | POA: Diagnosis not present

## 2020-07-27 DIAGNOSIS — M545 Low back pain, unspecified: Secondary | ICD-10-CM

## 2020-07-27 NOTE — Therapy (Signed)
[facility] [address] [place], [place], [address] Phone: [phone]   Fax:  [phone]  Physical Therapy Treatment  Patient Details  Name: Danielle Parrish MRN: [id number] Date of Birth: 1941/01/06 Referring Provider (PT): Arville Care, MD   Encounter Date: 07/27/2020   PT End of Session - 07/27/20 1121    Visit Number 9    Number of Visits 12    Date for PT Re-Evaluation 08/02/20    Authorization Type Progress note every 10th visit; KX modifier at 15th visit    PT Start Time 1117    PT Stop Time 1159    PT Time Calculation (min) 42 min    Activity Tolerance Patient tolerated treatment well    Behavior During Therapy [facility] for tasks assessed/performed           Past Medical History:  Diagnosis Date  . Anxiety   . Arthritis   . CHF (congestive heart failure) (H[facility])   . Chronic pain of multiple joints   . Depression   . Essential hypertension   . GERD (gastroesophageal reflux disease)   . Hyperlipidemia   . Hyperlipidemia associated with type 2 diabetes mellitus ([facility])   . Hypertension associated with type 2 diabetes mellitus ([facility])   . Hypothyroidism   . Osteoporosis   . Sleep apnea    CPAP  . Type 2 diabetes mellitus with hypercholesterolemia ([facility])   . Vitamin D deficiency     Past Surgical History:  Procedure Laterality Date  . ABDOMINAL HERNIA REPAIR    . ABDOMINAL HYSTERECTOMY  1977  . BREAST EXCISIONAL BIOPSY Right   . EYE SURGERY  2020   bilateral cataract removal   . KNEE ARTHROSCOPY Left 09/30/2014   Procedure: LEFT KNEE ARTHROSCOPY MEDIAL MENISECTOMY ABRASION CHONDROPLASTY SYNOVECTOMY SUPRAPATELLER CUFF;  Surgeon: Jacki Cones, MD;  Location: [facility];  Service: Orthopedics;  Laterality: Left;    There were no vitals filed for this visit.   Subjective Assessment - 07/27/20 1121    Subjective COVID 19 screening performed on patient upon arrival. Patient reported more flank pain today. Reports she  is always tired recently.    Pertinent History Anxiety, Arthritis, CHF, Depression, HTN, Osteoporosis, DM 2    Limitations House hold activities;Walking    Patient Stated Goals improve balance, move better along    Currently in Pain? Yes    Pain Score 5     Pain Location Flank    Pain Orientation Right    Pain Descriptors / Indicators Discomfort    Pain Type Chronic pain    Pain Onset More than a month ago    Pain Frequency Constant              [facility] PT Assessment - 07/27/20 0001      Assessment   Medical Diagnosis Weakness, At high risk for falls    Referring Provider (PT) Arville Care, MD    Next MD Visit "3 months"    Prior Therapy no      Precautions   Precautions [facility] [facility] Adult PT Treatment/Exercise - 07/27/20 0001      Lumbar Exercises: Machines for Strengthening   Cybex Lumbar Extension 50# x20 reps      Lumbar Exercises: Standing   Row Strengthening;Both;20 reps;Limitations    Row Limitations Green XTS    Shoulder Extension Strengthening;Both;20  reps;Limitations    Shoulder Extension Limitations Green XTS      Lumbar Exercises: Seated   Sit to Stand 20 reps      Lumbar Exercises: Supine   Bridge 20 reps;3 seconds    Straight Leg Raise 20 reps    Other Supine Lumbar Exercises B hip clam green theraband x20 reps      Knee/Hip Exercises: Aerobic   Nustep L4 x16 min      Knee/Hip Exercises: Machines for Strengthening   Cybex Knee Extension 10# x20 reps    Cybex Knee Flexion 20# x20 reps                       PT Long Term Goals - 07/14/20 1129      PT LONG TERM GOAL #1   Title Patient will be independent with HEP    Time 6    Period Weeks    Status On-going      PT LONG TERM GOAL #2   Title Patient will demonstrate 4+/5 or greater bilateral LE MMT to improve stability during functional tasks    Time 6    Period Weeks    Status On-going      PT LONG TERM GOAL #3   Title Patient will  improve functional LE strength as noted by the ability to perform modified 5x sit to stand test with UE support in 20 seconds or less.    Baseline Met 07/12/20    Time 6    Period Weeks    Status Achieved      PT LONG TERM GOAL #4   Title Patient report ability to walk in community for 10 mins or greater with no rest stops to improve cardiovascular and muscular endurance.    Baseline Met 07/12/20    Time 6    Period Weeks    Status Achieved      PT LONG TERM GOAL #5   Title Patient will reduce the risk of fall as noted by 5+ point improvement on the [facility] Balance Scale.    Baseline increased 1 point today to 52/56 07/14/20    Time 6    Period Weeks    Status On-going                 Plan - 07/27/20 1202    Clinical Impression Statement Patient presneted in clinic with only reports of flank pain. Patient guided through more resisted therex with only reports of fatigue. Patient notes some improvement in regards to ADLs and transfers due to strength and intermittant "good days" of B knee discomfort.    Personal Factors and Comorbidities Comorbidity 3+;Time since onset of injury/illness/exacerbation    Comorbidities Anxiety, Arthritis, CHF, Depression, HTN, Osteoporosis, DM 2    Examination-Activity Limitations Locomotion Level;Transfers;Stairs;Stand    Examination-Participation Restrictions Laundry    Stability/Clinical Decision Making Stable/Uncomplicated    Rehab Potential Good    PT Frequency 2x / week    PT Duration 6 weeks    PT Treatment/Interventions ADLs/Self Care Home Management;Cryotherapy;Electrical Stimulation;Iontophoresis 52m/ml Dexamethasone;Moist Heat;Ultrasound;Therapeutic exercise;Therapeutic activities;Functional mobility training;Balance training;Neuromuscular re-education;Stair training;Gait training;Patient/family education;Passive range of motion;Manual techniques    PT Next Visit Plan Continue LE strengthening and core stability    PT Home Exercise Plan see  patient education section    Consulted and Agree with Plan of Care Patient           Patient will benefit from skilled therapeutic intervention in order to improve the  following deficits and impairments:  Decreased activity tolerance, Decreased balance, Decreased strength, Decreased range of motion, Difficulty walking, Pain, Postural dysfunction  Visit Diagnosis: Muscle weakness (generalized)  Difficulty in walking, not elsewhere classified  Chronic right-sided low back pain, unspecified whether sciatica present     Problem List Patient Active Problem List   Diagnosis Date Noted  . Age-related osteoporosis with current pathological fracture 03/13/2019  . Chronic midline low back pain without sciatica 01/30/2019  . Age-related osteoporosis with current pathol fracture of vertebra ([place]) 10/28/2018  . Calcification of aorta ([facility]) 10/02/2018  . Compression fracture of T12 vertebra ([place]) 10/01/2018  . Primary osteoarthritis of right knee 07/20/2017  . Meniscus degeneration, right 06/14/2017  . Varicose veins of left lower extremity with complications 19/50/9326  . Pain medication agreement signed 06/06/2016  . Gout attack 01/11/2016  . OSA (obstructive sleep apnea) 09/14/2015  . Chronic diastolic heart failure ([place]) 09/10/2015  . Orthopnea 09/02/2015  . Dysphagia, pharyngoesophageal phase 08/04/2015  . Anorexia 08/04/2015  . Fatigue 08/04/2015  . Malaise and fatigue 08/04/2015  . Depression, recurrent ([place]) 04/08/2015  . Osteoarthritis of left knee 09/30/2014  . Vitamin D deficiency   . Anxiety   . GERD (gastroesophageal reflux disease)   . Arthritis   . Chronic pain of multiple joints   . Hypertension associated with type 2 diabetes mellitus ([place]) 05/15/2013  . Hyperlipidemia associated with type 2 diabetes mellitus ([place]) 05/15/2013  . Type 2 diabetes mellitus with hypercholesterolemia ([place]) 05/15/2013  . Morbid obesity ([place]) 05/15/2013  . Hypothyroidism 05/15/2013     Standley Brooking, PTA 07/27/2020, 12:04 PM  [facility] [address] [place], [place], [address] Phone: [phone]   Fax:  [phone]  Name: Fumiko Cham MRN: [id number] Date of Birth: February 03, 1941

## 2020-07-28 ENCOUNTER — Ambulatory Visit (INDEPENDENT_AMBULATORY_CARE_PROVIDER_SITE_OTHER): Payer: Medicare Other | Admitting: *Deleted

## 2020-07-28 DIAGNOSIS — Z0000 Encounter for general adult medical examination without abnormal findings: Secondary | ICD-10-CM

## 2020-07-28 NOTE — Progress Notes (Signed)
MEDICARE ANNUAL WELLNESS VISIT  07/28/2020  Telephone Visit Disclaimer This Medicare AWV was conducted by telephone due to national recommendations for restrictions regarding the COVID-19 Pandemic (e.g. social distancing).  I verified, using two identifiers, that I am speaking with Danielle Parrish or their authorized healthcare agent. I discussed the limitations, risks, security, and privacy concerns of performing an evaluation and management service by telephone and the potential availability of an in-person appointment in the future. The patient expressed understanding and agreed to proceed.   Subjective:  Danielle Parrish is a 79 y.o. female patient of Dettinger, Fransisca Kaufmann, MD who had a Medicare Annual Wellness Visit today via telephone. Danielle Parrish is Retired and lives with their daughter. she has 2 children. she reports that she is socially active and does interact with friends/family regularly. she is minimally physically active and enjoys watching her daily [employer] on TV and reading.  Patient Care Team: Dettinger, Fransisca Kaufmann, MD as PCP - General (Family Medicine) Ilean China, RN as Case Manager Sandford Craze, MD as Referring Physician (Dermatology)  Advanced Directives 07/28/2020 06/14/2020 10/22/2019 10/17/2019 07/28/2019 09/29/2018 07/23/2018  Does Patient Have a Medical Advance Directive? No No No No No No No  Would patient like information on creating a medical advance directive? No - Patient declined - No - Patient declined No - Patient declined No - Patient declined - No - Patient declined    Hospital Utilization Over the Past 12 Months: # of hospitalizations or ER visits: 0 # of surgeries: 0  Review of Systems    Patient reports that her overall health is unchanged compared to last year.  History obtained from chart review and the patient  Patient Reported Readings (BP, Pulse, CBG, Weight, etc) none  Pain Assessment Pain : No/denies pain     Current  Medications & Allergies (verified) Allergies as of 07/28/2020      Reactions   Crestor [rosuvastatin] Other (See Comments)   Body aches.    Nsaids    Burns stomach.    Penicillins Hives   Has patient had a PCN reaction causing immediate rash, facial/tongue/throat swelling, SOB or lightheadedness with hypotension: Yes Has patient had a PCN reaction causing severe rash involving mucus membranes or skin necrosis: unknown Has patient had a PCN reaction that required hospitalization No Has patient had a PCN reaction occurring within the last 10 years: No If all of the above answers are "NO", then may proceed with Cephalosporin use.   Vytorin [ezetimibe-simvastatin]    Body aches.    Tolmetin Nausea Only   Burns stomach.       Medication List       Accurate as of July 28, 2020  9:06 AM. If you have any questions, ask your nurse or doctor.        STOP taking these medications   cephALEXin 500 MG capsule Commonly known as: KEFLEX     TAKE these medications   acetaminophen 500 MG tablet Commonly known as: TYLENOL Take 1 tablet (500 mg total) by mouth every 6 (six) hours as needed.   albuterol 108 (90 Base) MCG/ACT inhaler Commonly known as: VENTOLIN HFA TAKE 2 PUFFS BY MOUTH EVERY 6 HOURS AS NEEDED FOR WHEEZE OR SHORTNESS OF BREATH   amLODipine 10 MG tablet Commonly known as: NORVASC TAKE 1 TABLET BY MOUTH EVERY DAY   aspirin 81 MG chewable tablet Chew 4 tablets by mouth daily.   Biotin 10 MG Caps Take 1 capsule by  mouth daily.   calcium-vitamin D 500-200 MG-UNIT Tabs tablet Commonly known as: OSCAL WITH D TAKE 1 TABLET BY MOUTH EVERY DAY WITH BREAKFAST   cyclobenzaprine 5 MG tablet Commonly known as: FLEXERIL Take 1 tablet (5 mg total) by mouth 3 (three) times daily as needed for muscle spasms.   denosumab 60 MG/ML Sosy injection Commonly known as: PROLIA Bring to your doctor to inject 60 mg subcutaneously.   diclofenac Sodium 1 % Gel Commonly known as:  Voltaren Apply 4 g topically 4 (four) times daily.   diphenhydrAMINE 25 MG tablet Commonly known as: BENADRYL Take 50 mg by mouth at bedtime as needed for allergies.   gabapentin 100 MG capsule Commonly known as: NEURONTIN Take 2 capsules (200 mg total) by mouth 3 (three) times daily.   ketoconazole 2 % cream Commonly known as: NIZORAL APPLY TO AFFECTED AREA EVERY DAY   levothyroxine 100 MCG tablet Commonly known as: SYNTHROID Take 1 Tablet by mouth once daily BEFORE BREAKFAST   lidocaine 5 % Commonly known as: Lidoderm Place 1 patch onto the skin daily. Remove & Discard patch within 12 hours or as directed by MD   lisinopril 20 MG tablet Commonly known as: ZESTRIL Take 1 tablet (20 mg total) by mouth daily.   lovastatin 40 MG tablet Commonly known as: MEVACOR Take 1 tablet (40 mg total) by mouth at bedtime.   mirabegron ER 25 MG Tb24 tablet Commonly known as: Myrbetriq Take 1 tablet (25 mg total) by mouth daily.   Multi-Vitamin tablet Take by mouth.   nystatin powder Commonly known as: MYCOSTATIN/NYSTOP Apply topically 4 (four) times daily.   nystatin cream Commonly known as: MYCOSTATIN Apply 1 application topically 2 (two) times daily.   omeprazole 20 MG capsule Commonly known as: PRILOSEC TAKE 1 CAPSULE (20 MG TOTAL) BY MOUTH 2 (TWO) TIMES DAILY BEFORE A MEAL.   PRESERVISION AREDS 2 PO Take by mouth.   pyridOXINE 100 MG tablet Commonly known as: VITAMIN B-6 Take 100 mg by mouth daily.   sertraline 100 MG tablet Commonly known as: ZOLOFT TAKE 2 TABLETS BY MOUTH EVERY DAY   sodium chloride 0.65 % Soln nasal spray Commonly known as: OCEAN Place 1 spray into both nostrils as needed for congestion.   VITAMIN D-3 PO Take 1 capsule by mouth daily.       History (reviewed): Past Medical History:  Diagnosis Date  . Anxiety   . Arthritis   . CHF (congestive heart failure) ([place])   . Chronic pain of multiple joints   . Depression   . Essential  hypertension   . GERD (gastroesophageal reflux disease)   . Hyperlipidemia   . Hyperlipidemia associated with type 2 diabetes mellitus ([place])   . Hypertension associated with type 2 diabetes mellitus ([place])   . Hypothyroidism   . Osteoporosis   . Sleep apnea    CPAP  . Type 2 diabetes mellitus with hypercholesterolemia ([place])   . Vitamin D deficiency    Past Surgical History:  Procedure Laterality Date  . ABDOMINAL HERNIA REPAIR    . ABDOMINAL HYSTERECTOMY  1977  . BREAST EXCISIONAL BIOPSY Right   . EYE SURGERY  2020   bilateral cataract removal   . KNEE ARTHROSCOPY Left 09/30/2014   Procedure: LEFT KNEE ARTHROSCOPY MEDIAL MENISECTOMY ABRASION CHONDROPLASTY SYNOVECTOMY SUPRAPATELLER CUFF;  Surgeon: Tobi Bastos, MD;  Location: [facility];  Service: Orthopedics;  Laterality: Left;   Family History  Problem Relation Age of Onset  . Hypertension Mother   .  Stroke Mother   . Cancer Father    Social History   Socioeconomic History  . Marital status: Widowed    Spouse name: Laverna Peace  . Number of children: 2  . Years of education: Not on file  . Highest education level: High school graduate  Occupational History  . Occupation: CNA    Comment: [employer]  . Occupation: retired  Tobacco Use  . Smoking status: Never Smoker  . Smokeless tobacco: Never Used  Vaping Use  . Vaping Use: Never used  Substance and Sexual Activity  . Alcohol use: No  . Drug use: No  . Sexual activity: Not Currently  Other Topics Concern  . Not on file  Social History Narrative  . Not on file   Social Determinants of Health   Financial Resource Strain: Low Risk   . Difficulty of Paying Living Expenses: Not hard at all  Food Insecurity: No Food Insecurity  . Worried About Charity fundraiser in the Last Year: Never true  . Ran Out of Food in the Last Year: Never true  Transportation Needs: No Transportation Needs  . Lack of Transportation (Medical): No  . Lack of Transportation (Non-Medical): No   Physical Activity: Sufficiently Active  . Days of Exercise per Week: 7 days  . Minutes of Exercise per Session: 30 min  Stress: No Stress Concern Present  . Feeling of Stress : Not at all  Social Connections: Socially Isolated  . Frequency of Communication with Friends and Family: More than three times a week  . Frequency of Social Gatherings with Friends and Family: More than three times a week  . Attends Religious Services: Never  . Active Member of Clubs or Organizations: No  . Attends Archivist Meetings: Never  . Marital Status: Widowed    Activities of Daily Living In your present state of health, do you have any difficulty performing the following activities: 07/28/2020  Hearing? Y  Comment bilateral hearing aids  Vision? N  Comment wears rx glasses-had eye exam last week  Difficulty concentrating or making decisions? Y  Comment pt states she has to write things down to remember them especially appts  Walking or climbing stairs? N  Dressing or bathing? N  Doing errands, shopping? N  Comment she can do most errands by herself but she doesn't like to drive by herself when she has to go to [place] for [employer] and eating ? N  Using the Toilet? N  In the past six months, have you accidently leaked urine? Y  Comment pt is on Myrbetriq which she states does help  Do you have problems with loss of bowel control? N  Managing your Medications? N  Managing your Finances? N  Housekeeping or managing your Housekeeping? N  Some recent data might be hidden    Patient Education/ Literacy How often do you need to have someone help you when you read instructions, pamphlets, or other written materials from your doctor or pharmacy?: 1 - Never What is the last grade level you completed in school?: 12th grade  Exercise Current Exercise Habits: Home exercise routine, Type of exercise: stretching;walking, Time (Minutes): 30, Frequency (Times/Week): 7, Weekly  Exercise (Minutes/Week): 210, Intensity: Mild, Exercise limited by: cardiac condition(s);orthopedic condition(s)  Diet Patient reports consuming 2 meals a day and 1 snack(s) a day Patient reports that her primary diet is: Regular Patient reports that she does have regular access to food.   Depression Screen [facility] 2/9  Scores 07/28/2020 06/07/2020 05/14/2020 04/05/2020 03/15/2020 12/12/2019 07/15/2019  PHQ - 2 Score 0 0 0 3 1 1 2   PHQ- 9 Score - - - 13 - - 7     Fall Risk Fall Risk  07/28/2020 07/28/2020 06/07/2020 05/14/2020 04/05/2020  Falls in the past year? 1 1 1 1 1   Number falls in past yr: 1 1 0 1 1  Injury with Fall? 0 0 0 0 0  Comment - - - - -  Risk Factor Category  - - - - -  Risk for fall due to : History of fall(s) History of fall(s) History of fall(s) History of fall(s) -  Risk for fall due to: Comment - - - - -  Follow up Falls evaluation completed Falls evaluation completed Falls evaluation completed Falls evaluation completed -     Objective:  Caliana Spires Vannice seemed alert and oriented and she participated appropriately during our telephone visit.  Blood Pressure Weight BMI  BP Readings from Last 3 Encounters:  06/07/20 130/68  05/14/20 (!) 130/59  04/05/20 134/67   Wt Readings from Last 3 Encounters:  06/07/20 187 lb (84.8 kg)  05/14/20 192 lb (87.1 kg)  04/05/20 185 lb 8 oz (84.1 kg)   BMI Readings from Last 1 Encounters:  06/07/20 33.13 kg/m    *Unable to obtain current vital signs, weight, and BMI due to telephone visit type  Hearing/Vision  . Carolene did not seem to have difficulty with hearing/understanding during the telephone conversation . Reports that she has had a formal eye exam by an eye care professional within the past year . Reports that she has not had a formal hearing evaluation within the past year *Unable to fully assess hearing and vision during telephone visit type  Cognitive Function: 6CIT Screen 07/28/2020 07/28/2019  What Year? 0 points 0 points   What month? 0 points 0 points  What time? 0 points 0 points  Count back from 20 0 points 2 points  Months in reverse 0 points 0 points  Repeat phrase 0 points 0 points  Total Score 0 2   (Normal:0-7, Significant for Dysfunction: >8)  Normal Cognitive Function Screening: Yes   Immunization & Health Maintenance Record Immunization History  Administered Date(s) Administered  . Influenza, High Dose Seasonal PF 11/14/2017, 10/01/2018  . Influenza,inj,Quad PF,6+ Mos 10/15/2013, 11/17/2014, 09/23/2015, 09/14/2016  . Influenza-Unspecified 10/22/2019  . Moderna SARS-COVID-2 Vaccination 01/17/2020, 02/14/2020  . Pneumococcal Conjugate-13 04/08/2015  . Pneumococcal-Unspecified 12/26/2013  . Tdap 10/15/2013  . Zoster 12/26/2013  . Zoster Recombinat (Shingrix) 08/06/2019, 12/02/2019    Health Maintenance  Topic Date Due  . FOOT EXAM  03/12/2020  . OPHTHALMOLOGY EXAM  07/08/2020  . INFLUENZA VACCINE  07/25/2020  . HEMOGLOBIN A1C  09/15/2020  . TETANUS/TDAP  10/16/2023  . DEXA SCAN  Completed  . COVID-19 Vaccine  Completed  . Hepatitis C Screening  Completed  . PNA vac Low Risk Adult  Completed       Assessment  This is a routine wellness examination for Danielle Parrish.  Health Maintenance: Due or Overdue Health Maintenance Due  Topic Date Due  . FOOT EXAM  03/12/2020  . OPHTHALMOLOGY EXAM  07/08/2020  . INFLUENZA VACCINE  07/25/2020    Danielle Parrish does not need a referral for Community Assistance: Care Management:   no Social Work:    no Prescription Assistance:  no Nutrition/Diabetes Education:  no   Plan:  Personalized Goals Goals Addressed  This Visit's Progress   . DIET - INCREASE WATER INTAKE       Try to drink 6-8 glasses of water daily      Personalized Health Maintenance & Screening Recommendations  Influenza vaccine Diabetic Eye Exam  Lung Cancer Screening Recommended: no (Low Dose CT Chest recommended if Age 60-80 years, 30  pack-year currently smoking OR have quit w/in past 15 years) Hepatitis C Screening recommended: no HIV Screening recommended: no  Advanced Directives: Written information was not prepared per patient's request.  Referrals & Orders No orders of the defined types were placed in this encounter.   Follow-up Plan . Follow-up with Dettinger, Fransisca Kaufmann, MD as planned . Schedule your Diabetic Eye Exam . Get your Flu vaccine at your next visit with your PCP   I have personally reviewed and noted the following in the patient's chart:   . Medical and social history . Use of alcohol, tobacco or illicit drugs  . Current medications and supplements . Functional ability and status . Nutritional status . Physical activity . Advanced directives . List of other physicians . Hospitalizations, surgeries, and ER visits in previous 12 months . Vitals . Screenings to include cognitive, depression, and falls . Referrals and appointments  In addition, I have reviewed and discussed with Danielle Parrish certain preventive protocols, quality metrics, and best practice recommendations. A written personalized care plan for preventive services as well as general preventive health recommendations is available and can be mailed to the patient at her request.      Milas Hock, LPN  01/25/1154

## 2020-07-28 NOTE — Patient Instructions (Signed)
Preventive Care 80 Years and Older, Female Preventive care refers to lifestyle choices and visits with your health care provider that can promote health and wellness. This includes:  A yearly physical exam. This is also called an annual well check.  Regular dental and eye exams.  Immunizations.  Screening for certain conditions.  Healthy lifestyle choices, such as diet and exercise. What can I expect for my preventive care visit? Physical exam Your health care provider will check:  Height and weight. These may be used to calculate body mass index (BMI), which is a measurement that tells if you are at a healthy weight.  Heart rate and blood pressure.  Your skin for abnormal spots. Counseling Your health care provider may ask you questions about:  Alcohol, tobacco, and drug use.  Emotional well-being.  Home and relationship well-being.  Sexual activity.  Eating habits.  History of falls.  Memory and ability to understand (cognition).  Work and work Statistician.  Pregnancy and menstrual history. What immunizations do I need?  Influenza (flu) vaccine  This is recommended every year. Tetanus, diphtheria, and pertussis (Tdap) vaccine  You may need a Td booster every 10 years. Varicella (chickenpox) vaccine  You may need this vaccine if you have not already been vaccinated. Zoster (shingles) vaccine  You may need this after age 80. Pneumococcal conjugate (PCV13) vaccine  One dose is recommended after age 80. Pneumococcal polysaccharide (PPSV23) vaccine  One dose is recommended after age 80. Measles, mumps, and rubella (MMR) vaccine  You may need at least one dose of MMR if you were born in 1957 or later. You may also need a second dose. Meningococcal conjugate (MenACWY) vaccine  You may need this if you have certain conditions. Hepatitis A vaccine  You may need this if you have certain conditions or if you travel or work in places where you may be exposed  to hepatitis A. Hepatitis B vaccine  You may need this if you have certain conditions or if you travel or work in places where you may be exposed to hepatitis B. Haemophilus influenzae type b (Hib) vaccine  You may need this if you have certain conditions. You may receive vaccines as individual doses or as more than one vaccine together in one shot (combination vaccines). Talk with your health care provider about the risks and benefits of combination vaccines. What tests do I need? Blood tests  Lipid and cholesterol levels. These may be checked every 5 years, or more frequently depending on your overall health.  Hepatitis C test.  Hepatitis B test. Screening  Lung cancer screening. You may have this screening every year starting at age 80 if you have a 30-pack-year history of smoking and currently smoke or have quit within the past 15 years.  Colorectal cancer screening. All adults should have this screening starting at age 80 and continuing until age 15. Your health care provider may recommend screening at age 80 if you are at increased risk. You will have tests every 1-10 years, depending on your results and the type of screening test.  Diabetes screening. This is done by checking your blood sugar (glucose) after you have not eaten for a while (fasting). You may have this done every 1-3 years.  Mammogram. This may be done every 1-2 years. Talk with your health care provider about how often you should have regular mammograms.  BRCA-related cancer screening. This may be done if you have a family history of breast, ovarian, tubal, or peritoneal cancers.  Other tests  Sexually transmitted disease (STD) testing.  Bone density scan. This is done to screen for osteoporosis. You may have this done starting at age 80. Follow these instructions at home: Eating and drinking  Eat a diet that includes fresh fruits and vegetables, whole grains, lean protein, and low-fat dairy products. Limit  your intake of foods with high amounts of sugar, saturated fats, and salt.  Take vitamin and mineral supplements as recommended by your health care provider.  Do not drink alcohol if your health care provider tells you not to drink.  If you drink alcohol: ? Limit how much you have to 0-1 drink a day. ? Be aware of how much alcohol is in your drink. In the [place] one drink equals one 12 oz bottle of beer (355 mL), one 5 oz glass of wine (148 mL), or one 1 oz glass of hard liquor (44 mL). Lifestyle  Take daily care of your teeth and gums.  Stay active. Exercise for at least 30 minutes on 5 or more days each week.  Do not use any products that contain nicotine or tobacco, such as cigarettes, e-cigarettes, and chewing tobacco. If you need help quitting, ask your health care provider.  If you are sexually active, practice safe sex. Use a condom or other form of protection in order to prevent STIs (sexually transmitted infections).  Talk with your health care provider about taking a low-dose aspirin or statin. What's next?  Go to your health care provider once a year for a well check visit.  Ask your health care provider how often you should have your eyes and teeth checked.  Stay up to date on all vaccines. This information is not intended to replace advice given to you by your health care provider. Make sure you discuss any questions you have with your health care provider. Document Revised: 12/05/2018 Document Reviewed: 12/05/2018 Elsevier Patient Education  2020 [employer].

## 2020-07-29 ENCOUNTER — Ambulatory Visit: Payer: Medicare Other | Admitting: Physical Therapy

## 2020-07-29 ENCOUNTER — Encounter: Payer: Self-pay | Admitting: Physical Therapy

## 2020-07-29 ENCOUNTER — Other Ambulatory Visit: Payer: Self-pay

## 2020-07-29 DIAGNOSIS — G8929 Other chronic pain: Secondary | ICD-10-CM | POA: Diagnosis not present

## 2020-07-29 DIAGNOSIS — M545 Low back pain, unspecified: Secondary | ICD-10-CM

## 2020-07-29 DIAGNOSIS — M6281 Muscle weakness (generalized): Secondary | ICD-10-CM | POA: Diagnosis not present

## 2020-07-29 DIAGNOSIS — R262 Difficulty in walking, not elsewhere classified: Secondary | ICD-10-CM

## 2020-07-29 NOTE — Therapy (Signed)
[facility] [place], [place], [address] Phone: [phone]   Fax:  [phone]  Physical Therapy Treatment Progress Note Reporting Period 06/14/2020 to 07/29/2020  See note below for Objective Data and Assessment of Progress/Goals. Patient progressing but balance is still limitation. Gabriela Eves, PT, DPT     Patient Details  Name: Danielle Parrish MRN: [id number] Date of Birth: 1941-12-17 Referring Provider (PT): Caryl Pina, MD   Encounter Date: 07/29/2020   PT End of Session - 07/29/20 1203    Visit Number 10    Number of Visits 12    Date for PT Re-Evaluation 08/02/20    Authorization Type Progress note every 10th visit; KX modifier at 15th visit    PT Start Time 1116    PT Stop Time 1201    PT Time Calculation (min) 45 min           Past Medical History:  Diagnosis Date  . Anxiety   . Arthritis   . CHF (congestive heart failure) ([place])   . Chronic pain of multiple joints   . Depression   . Essential hypertension   . GERD (gastroesophageal reflux disease)   . Hyperlipidemia   . Hyperlipidemia associated with type 2 diabetes mellitus ([place])   . Hypertension associated with type 2 diabetes mellitus ([place])   . Hypothyroidism   . Osteoporosis   . Sleep apnea    CPAP  . Type 2 diabetes mellitus with hypercholesterolemia ([place])   . Vitamin D deficiency     Past Surgical History:  Procedure Laterality Date  . ABDOMINAL HERNIA REPAIR    . ABDOMINAL HYSTERECTOMY  1977  . BREAST EXCISIONAL BIOPSY Right   . EYE SURGERY  2020   bilateral cataract removal   . KNEE ARTHROSCOPY Left 09/30/2014   Procedure: LEFT KNEE ARTHROSCOPY MEDIAL MENISECTOMY ABRASION CHONDROPLASTY SYNOVECTOMY SUPRAPATELLER CUFF;  Surgeon: Tobi Bastos, MD;  Location: [facility];  Service: Orthopedics;  Laterality: Left;    There were no vitals filed for this visit.   Subjective Assessment - 07/29/20 1120    Subjective COVID 19  screening performed on patient upon arrival. Patient reported more flank pain today. Not tired today as she just ate.    Pertinent History Anxiety, Arthritis, CHF, Depression, HTN, Osteoporosis, DM 2    Limitations House hold activities;Walking    Patient Stated Goals improve balance, move better along    Currently in Pain? Yes    Pain Score 5     Pain Location Flank    Pain Orientation Right    Pain Descriptors / Indicators Discomfort    Pain Type Chronic pain    Pain Onset More than a month ago    Pain Frequency Constant              [facility] PT Assessment - 07/29/20 0001      Assessment   Medical Diagnosis Weakness, At high risk for falls    Referring Provider (PT) Caryl Pina, MD    Next MD Visit "3 months"    Prior Therapy no      Precautions   Precautions Bernerd Limbo Adult PT Treatment/Exercise - 07/29/20 0001      Lumbar Exercises: Machines for Strengthening   Cybex Lumbar Extension 50# x20 reps      Lumbar Exercises: Standing   Row Strengthening;Both;20  reps;Limitations    Row Limitations Blue XTS      Lumbar Exercises: Seated   Other Seated Lumbar Exercises Horizontal abduction yellow theraband x10 reps    Other Seated Lumbar Exercises B D2 strengthening yellow theraband x10 reps each      Knee/Hip Exercises: Aerobic   Nustep L4 x17 min      Knee/Hip Exercises: Machines for Strengthening   Cybex Knee Extension 10# x20 reps    Cybex Knee Flexion 30# x20 reps               Balance Exercises - 07/29/20 0001      Balance Exercises: Standing   Standing Eyes Opened Narrow base of support (BOS);Foam/compliant surface   x3 min   Tandem Stance Eyes open;Foam/compliant surface   x3 min   Sidestepping 4 reps    Marching Foam/compliant surface;Intermittent upper extremity assist;Static;15 reps                  PT Long Term Goals - 07/14/20 1129      PT LONG TERM GOAL #1   Title Patient will be  independent with HEP    Time 6    Period Weeks    Status On-going      PT LONG TERM GOAL #2   Title Patient will demonstrate 4+/5 or greater bilateral LE MMT to improve stability during functional tasks    Time 6    Period Weeks    Status On-going      PT LONG TERM GOAL #3   Title Patient will improve functional LE strength as noted by the ability to perform modified 5x sit to stand test with UE support in 20 seconds or less.    Baseline Met 07/12/20    Time 6    Period Weeks    Status Achieved      PT LONG TERM GOAL #4   Title Patient report ability to walk in community for 10 mins or greater with no rest stops to improve cardiovascular and muscular endurance.    Baseline Met 07/12/20    Time 6    Period Weeks    Status Achieved      PT LONG TERM GOAL #5   Title Patient will reduce the risk of fall as noted by 5+ point improvement on the [facility] Balance Scale.    Baseline increased 1 point today to 52/56 07/14/20    Time 6    Period Weeks    Status On-going                 Plan - 07/29/20 1210    Clinical Impression Statement Patient presented in clinic with only reports of flank pain. Patient guided through LE and back strengthening with only report of muscle fatigue. Patient reports her greatest limitation is balance now as she is concerned when she carries her 80 year old great grandson. Patient progressing in regards to LTGs at this time. Patient very unstable with balance activities and required VC for posture in standing during balance activites. Patient reported more LBP and knee pain by end of session today.    Personal Factors and Comorbidities Comorbidity 3+;Time since onset of injury/illness/exacerbation    Comorbidities Anxiety, Arthritis, CHF, Depression, HTN, Osteoporosis, DM 2    Examination-Activity Limitations Locomotion Level;Transfers;Stairs;Stand    Examination-Participation Restrictions Laundry    Stability/Clinical Decision Making Stable/Uncomplicated      Rehab Potential Good    PT Frequency 2x / week    PT  Duration 6 weeks    PT Treatment/Interventions ADLs/Self Care Home Management;Cryotherapy;Electrical Stimulation;Iontophoresis 47m/ml Dexamethasone;Moist Heat;Ultrasound;Therapeutic exercise;Therapeutic activities;Functional mobility training;Balance training;Neuromuscular re-education;Stair training;Gait training;Patient/family education;Passive range of motion;Manual techniques    PT Next Visit Plan Continue LE strengthening and core stability    PT Home Exercise Plan see patient education section    Consulted and Agree with Plan of Care Patient           Patient will benefit from skilled therapeutic intervention in order to improve the following deficits and impairments:  Decreased activity tolerance, Decreased balance, Decreased strength, Decreased range of motion, Difficulty walking, Pain, Postural dysfunction  Visit Diagnosis: Muscle weakness (generalized)  Difficulty in walking, not elsewhere classified  Chronic right-sided low back pain, unspecified whether sciatica present     Problem List Patient Active Problem List   Diagnosis Date Noted  . Age-related osteoporosis with current pathological fracture 03/13/2019  . Chronic midline low back pain without sciatica 01/30/2019  . Age-related osteoporosis with current pathol fracture of vertebra ([place] 10/28/2018  . Calcification of aorta (HCC) 10/02/2018  . Compression fracture of T12 vertebra ([place] 10/01/2018  . Primary osteoarthritis of right knee 07/20/2017  . Meniscus degeneration, right 06/14/2017  . Varicose veins of left lower extremity with complications 074/[id number] . Pain medication agreement signed 06/06/2016  . Gout attack 01/11/2016  . OSA (obstructive sleep apnea) 09/14/2015  . Chronic diastolic heart failure ([place] 09/10/2015  . Orthopnea 09/02/2015  . Dysphagia, pharyngoesophageal phase 08/04/2015  . Anorexia 08/04/2015  . Fatigue 08/04/2015  . Malaise  and fatigue 08/04/2015  . Depression, recurrent ([place] 04/08/2015  . Osteoarthritis of left knee 09/30/2014  . Vitamin D deficiency   . Anxiety   . GERD (gastroesophageal reflux disease)   . Arthritis   . Chronic pain of multiple joints   . Hypertension associated with type 2 diabetes mellitus ([place] 05/15/2013  . Hyperlipidemia associated with type 2 diabetes mellitus ([place] 05/15/2013  . Type 2 diabetes mellitus with hypercholesterolemia ([place] 05/15/2013  . Morbid obesity ([place] 05/15/2013  . Hypothyroidism 05/15/2013    KStandley Brooking PTA 07/29/2020, 12:16 PM  [facility]MRuston [place] [address]Phone: [phone]  Fax:  [phone] Name: RDakiyah HeinkeMRN: [id number] of Birth: 101-11-1941

## 2020-08-03 ENCOUNTER — Ambulatory Visit: Payer: Medicare Other | Admitting: Physical Therapy

## 2020-08-03 ENCOUNTER — Other Ambulatory Visit: Payer: Self-pay

## 2020-08-03 ENCOUNTER — Encounter: Payer: Self-pay | Admitting: Physical Therapy

## 2020-08-03 DIAGNOSIS — M545 Low back pain, unspecified: Secondary | ICD-10-CM

## 2020-08-03 DIAGNOSIS — M6281 Muscle weakness (generalized): Secondary | ICD-10-CM

## 2020-08-03 DIAGNOSIS — G8929 Other chronic pain: Secondary | ICD-10-CM | POA: Diagnosis not present

## 2020-08-03 DIAGNOSIS — R262 Difficulty in walking, not elsewhere classified: Secondary | ICD-10-CM | POA: Diagnosis not present

## 2020-08-03 NOTE — Therapy (Signed)
[facility] [place], [place], [address] Phone: [phone]   Fax:  [phone]  Physical Therapy Treatment  Patient Details  Name: Danielle Parrish MRN: [id number] Date of Birth: 1941/06/17 Referring Provider (PT): Caryl Pina, MD   Encounter Date: 08/03/2020   PT End of Session - 08/03/20 1146    Visit Number 11    Number of Visits 12    Date for PT Re-Evaluation 08/02/20    Authorization Type Progress note every 10th visit; KX modifier at 15th visit    PT Start Time 1117    PT Stop Time 1200    PT Time Calculation (min) 43 min    Activity Tolerance Patient tolerated treatment well    Behavior During Therapy [facility] for tasks assessed/performed           Past Medical History:  Diagnosis Date  . Anxiety   . Arthritis   . CHF (congestive heart failure) ([place])   . Chronic pain of multiple joints   . Depression   . Essential hypertension   . GERD (gastroesophageal reflux disease)   . Hyperlipidemia   . Hyperlipidemia associated with type 2 diabetes mellitus ([place])   . Hypertension associated with type 2 diabetes mellitus ([place])   . Hypothyroidism   . Osteoporosis   . Sleep apnea    CPAP  . Type 2 diabetes mellitus with hypercholesterolemia ([place])   . Vitamin D deficiency     Past Surgical History:  Procedure Laterality Date  . ABDOMINAL HERNIA REPAIR    . ABDOMINAL HYSTERECTOMY  1977  . BREAST EXCISIONAL BIOPSY Right   . EYE SURGERY  2020   bilateral cataract removal   . KNEE ARTHROSCOPY Left 09/30/2014   Procedure: LEFT KNEE ARTHROSCOPY MEDIAL MENISECTOMY ABRASION CHONDROPLASTY SYNOVECTOMY SUPRAPATELLER CUFF;  Surgeon: Tobi Bastos, MD;  Location: [facility];  Service: Orthopedics;  Laterality: Left;    There were no vitals filed for this visit.   Subjective Assessment - 08/03/20 1142    Subjective COVID 19 screening performed on patient upon arrival. Patient reportes heel pain, R flank pain and B knee  pain.    Pertinent History Anxiety, Arthritis, CHF, Depression, HTN, Osteoporosis, DM 2    Limitations House hold activities;Walking    Patient Stated Goals improve balance, move better along    Currently in Pain? Yes    Pain Score 7     Pain Location Flank    Pain Orientation Right;Lower    Pain Descriptors / Indicators Discomfort    Pain Type Chronic pain    Pain Onset More than a month ago    Pain Frequency Constant    Multiple Pain Sites Yes    Pain Score 6    Pain Location Knee    Pain Orientation Right;Left    Pain Descriptors / Indicators Discomfort    Pain Type Chronic pain    Pain Onset More than a month ago    Pain Frequency Constant    Pain Score 9    Pain Location Heel    Pain Orientation Left    Pain Descriptors / Indicators Discomfort    Pain Type Chronic pain    Pain Onset More than a month ago    Pain Frequency Constant              [facility] PT Assessment - 08/03/20 0001      Assessment   Medical Diagnosis Weakness, At high risk for falls    Referring  Provider (PT) Caryl Pina, MD    Next MD Visit "3 months"    Prior Therapy no      Precautions   Precautions Bernerd Limbo Adult PT Treatment/Exercise - 08/03/20 0001      Lumbar Exercises: Machines for Strengthening   Cybex Lumbar Extension 50# x20 reps      Lumbar Exercises: Seated   Other Seated Lumbar Exercises Horizontal abduction yellow theraband x10 reps    Other Seated Lumbar Exercises B D2 strengthening yellow theraband x10 reps each      Knee/Hip Exercises: Aerobic   Nustep L4 x19 min      Knee/Hip Exercises: Machines for Strengthening   Cybex Knee Extension 10# x30 reps    Cybex Knee Flexion 30# x30 reps                       PT Long Term Goals - 07/14/20 1129      PT LONG TERM GOAL #1   Title Patient will be independent with HEP    Time 6    Period Weeks    Status On-going      PT LONG TERM GOAL #2   Title Patient will  demonstrate 4+/5 or greater bilateral LE MMT to improve stability during functional tasks    Time 6    Period Weeks    Status On-going      PT LONG TERM GOAL #3   Title Patient will improve functional LE strength as noted by the ability to perform modified 5x sit to stand test with UE support in 20 seconds or less.    Baseline Met 07/12/20    Time 6    Period Weeks    Status Achieved      PT LONG TERM GOAL #4   Title Patient report ability to walk in community for 10 mins or greater with no rest stops to improve cardiovascular and muscular endurance.    Baseline Met 07/12/20    Time 6    Period Weeks    Status Achieved      PT LONG TERM GOAL #5   Title Patient will reduce the risk of fall as noted by 5+ point improvement on the [facility] Balance Scale.    Baseline increased 1 point today to 52/56 07/14/20    Time 6    Period Weeks    Status On-going                 Plan - 08/03/20 1220    Clinical Impression Statement Patient arrived with more pain in lumbar/flank, B knees and L heel. Patient limited with standing exercises due to the standing exercises. Patient fatigued quickly during today's treatment as well. Limited progress at this time secondary to pain.    Personal Factors and Comorbidities Comorbidity 3+;Time since onset of injury/illness/exacerbation    Comorbidities Anxiety, Arthritis, CHF, Depression, HTN, Osteoporosis, DM 2    Examination-Activity Limitations Locomotion Level;Transfers;Stairs;Stand    Examination-Participation Restrictions Laundry    Stability/Clinical Decision Making Stable/Uncomplicated    Rehab Potential Good    PT Frequency 2x / week    PT Duration 6 weeks    PT Treatment/Interventions ADLs/Self Care Home Management;Cryotherapy;Electrical Stimulation;Iontophoresis 41m/ml Dexamethasone;Moist Heat;Ultrasound;Therapeutic exercise;Therapeutic activities;Functional mobility training;Balance training;Neuromuscular re-education;Stair training;Gait  training;Patient/family education;Passive range of motion;Manual techniques    PT Next Visit Plan D/C  summary required.    PT Home Exercise Plan see patient education section    Consulted and Agree with Plan of Care Patient           Patient will benefit from skilled therapeutic intervention in order to improve the following deficits and impairments:  Decreased activity tolerance, Decreased balance, Decreased strength, Decreased range of motion, Difficulty walking, Pain, Postural dysfunction  Visit Diagnosis: Muscle weakness (generalized)  Difficulty in walking, not elsewhere classified  Chronic right-sided low back pain, unspecified whether sciatica present     Problem List Patient Active Problem List   Diagnosis Date Noted  . Age-related osteoporosis with current pathological fracture 03/13/2019  . Chronic midline low back pain without sciatica 01/30/2019  . Age-related osteoporosis with current pathol fracture of vertebra ([place]) 10/28/2018  . Calcification of aorta (HCC) 10/02/2018  . Compression fracture of T12 vertebra ([place]) 10/01/2018  . Primary osteoarthritis of right knee 07/20/2017  . Meniscus degeneration, right 06/14/2017  . Varicose veins of left lower extremity with complications 72/25/7505  . Pain medication agreement signed 06/06/2016  . Gout attack 01/11/2016  . OSA (obstructive sleep apnea) 09/14/2015  . Chronic diastolic heart failure ([place]) 09/10/2015  . Orthopnea 09/02/2015  . Dysphagia, pharyngoesophageal phase 08/04/2015  . Anorexia 08/04/2015  . Fatigue 08/04/2015  . Malaise and fatigue 08/04/2015  . Depression, recurrent ([place]) 04/08/2015  . Osteoarthritis of left knee 09/30/2014  . Vitamin D deficiency   . Anxiety   . GERD (gastroesophageal reflux disease)   . Arthritis   . Chronic pain of multiple joints   . Hypertension associated with type 2 diabetes mellitus ([place]) 05/15/2013  . Hyperlipidemia associated with type 2 diabetes mellitus ([place])  05/15/2013  . Type 2 diabetes mellitus with hypercholesterolemia ([place]) 05/15/2013  . Morbid obesity ([place]) 05/15/2013  . Hypothyroidism 05/15/2013    Standley Brooking, PTA 08/03/2020, 12:27 PM  [facility] [address] [place], [place], [address] Phone: [phone]   Fax:  [phone]  Name: Danielle Parrish MRN: [id number] Date of Birth: Mar 04, 1941

## 2020-08-05 ENCOUNTER — Encounter: Payer: Self-pay | Admitting: Physical Therapy

## 2020-08-05 ENCOUNTER — Ambulatory Visit: Payer: Medicare Other | Admitting: Physical Therapy

## 2020-08-05 ENCOUNTER — Other Ambulatory Visit: Payer: Self-pay

## 2020-08-05 DIAGNOSIS — R262 Difficulty in walking, not elsewhere classified: Secondary | ICD-10-CM | POA: Diagnosis not present

## 2020-08-05 DIAGNOSIS — G8929 Other chronic pain: Secondary | ICD-10-CM | POA: Diagnosis not present

## 2020-08-05 DIAGNOSIS — M545 Low back pain, unspecified: Secondary | ICD-10-CM

## 2020-08-05 DIAGNOSIS — M6281 Muscle weakness (generalized): Secondary | ICD-10-CM

## 2020-08-05 NOTE — Therapy (Addendum)
[facility] [place], [place], [address] Phone: [phone]   Fax:  [phone]  Physical Therapy Treatment   PHYSICAL THERAPY DISCHARGE SUMMARY  Visits from Start of Care: 12  Current functional level related to goals / functional outcomes: See below   Remaining deficits: See goals   Education / Equipment: HEP Plan: Patient agrees to discharge.  Patient goals were partially met. Patient is being discharged due to being pleased with the current functional level.  ?????  Gabriela Eves, PT, DPT   Patient Details  Name: Danielle Parrish MRN: [id number] Date of Birth: [birth year] Referring Provider (PT): Caryl Pina, MD   Encounter Date: 08/05/2020   PT End of Session - 08/05/20 1134    Visit Number 12    Number of Visits 12    Date for PT Re-Evaluation 08/02/20    Authorization Type Progress note every 10th visit; KX modifier at 15th visit    PT Start Time 1116    PT Stop Time 1200    PT Time Calculation (min) 44 min    Activity Tolerance Patient tolerated treatment well    Behavior During Therapy [facility] for tasks assessed/performed           Past Medical History:  Diagnosis Date  . Anxiety   . Arthritis   . CHF (congestive heart failure) ([place])   . Chronic pain of multiple joints   . Depression   . Essential hypertension   . GERD (gastroesophageal reflux disease)   . Hyperlipidemia   . Hyperlipidemia associated with type 2 diabetes mellitus ([place])   . Hypertension associated with type 2 diabetes mellitus ([place])   . Hypothyroidism   . Osteoporosis   . Sleep apnea    CPAP  . Type 2 diabetes mellitus with hypercholesterolemia ([place])   . Vitamin D deficiency     Past Surgical History:  Procedure Laterality Date  . ABDOMINAL HERNIA REPAIR    . ABDOMINAL HYSTERECTOMY  1977  . BREAST EXCISIONAL BIOPSY Right   . EYE SURGERY  2020   bilateral cataract removal   . KNEE ARTHROSCOPY Left 09/30/2014     Procedure: LEFT KNEE ARTHROSCOPY MEDIAL MENISECTOMY ABRASION CHONDROPLASTY SYNOVECTOMY SUPRAPATELLER CUFF;  Surgeon: Tobi Bastos, MD;  Location: [facility];  Service: Orthopedics;  Laterality: Left;    There were no vitals filed for this visit.   Subjective Assessment - 08/05/20 1106    Subjective COVID 19 screening performed on patient upon arrival. Reports continued LBP and heel pain.    Pertinent History Anxiety, Arthritis, CHF, Depression, HTN, Osteoporosis, DM 2    Limitations House hold activities;Walking    Patient Stated Goals improve balance, move better along    Currently in Pain? Yes    Pain Score 7     Pain Location Back    Pain Orientation Lower    Pain Descriptors / Indicators Discomfort    Pain Type Chronic pain    Pain Onset More than a month ago    Pain Frequency Constant    Pain Score 9    Pain Location Heel    Pain Orientation Left    Pain Descriptors / Indicators Discomfort    Pain Type Chronic pain    Pain Onset More than a month ago    Pain Frequency Constant              [facility] PT Assessment - 08/05/20 0001      Assessment   Medical Diagnosis Weakness,  At high risk for falls    Referring Provider (PT) Caryl Pina, MD    Next MD Visit "3 months"    Prior Therapy no      Precautions   Precautions Fall      ROM / Strength   AROM / PROM / Strength Strength      Strength   Overall Strength Within functional limits for tasks performed    Strength Assessment Site Hip;Knee    Right/Left Hip Right;Left    Right Hip Flexion 4/5    Left Hip Flexion 4/5    Right/Left Knee Right;Left    Right Knee Flexion 4+/5    Right Knee Extension 4+/5    Left Knee Flexion 4+/5    Left Knee Extension 4+/5                         [facility] Adult PT Treatment/Exercise - 08/05/20 0001      Berg Balance Test   Sit to Stand Able to stand without using hands and stabilize independently    Standing Unsupported Able to stand safely 2 minutes     Sitting with Back Unsupported but Feet Supported on Floor or Stool Able to sit safely and securely 2 minutes    Stand to Sit Sits safely with minimal use of hands    Transfers Able to transfer safely, minor use of hands    Standing Unsupported with Eyes Closed Able to stand 10 seconds safely    Standing Ubsupported with Feet Together Able to place feet together independently and stand 1 minute safely    From Standing, Reach Forward with Outstretched Arm Can reach forward >12 cm safely (5")    From Standing Position, Pick up Object from Floor Able to pick up shoe safely and easily    From Standing Position, Turn to Look Behind Over each Shoulder Looks behind one side only/other side shows less weight shift    Turn 360 Degrees Able to turn 360 degrees safely in 4 seconds or less    Standing Unsupported, Alternately Place Feet on Step/Stool Able to stand independently and complete 8 steps >20 seconds    Standing Unsupported, One Foot in Front Able to place foot tandem independently and hold 30 seconds    Standing on One Leg Able to lift leg independently and hold > 10 seconds    Total Score 53      Lumbar Exercises: Machines for Strengthening   Cybex Lumbar Extension 50# x20 reps      Lumbar Exercises: Seated   Other Seated Lumbar Exercises Horizontal abduction yellow theraband x20 reps    Other Seated Lumbar Exercises Row yellow theraband x20 reps      Knee/Hip Exercises: Aerobic   Nustep L5 x15 min      Knee/Hip Exercises: Machines for Strengthening   Cybex Knee Extension 10# x30 reps    Cybex Knee Flexion 30# x30 reps                       PT Long Term Goals - 08/05/20 1143      PT LONG TERM GOAL #1   Title Patient will be independent with HEP    Time 6    Period Weeks    Status Achieved      PT LONG TERM GOAL #2   Title Patient will demonstrate 4+/5 or greater bilateral LE MMT to improve stability during functional tasks    Time  6    Period Weeks    Status  Partially Met      PT LONG TERM GOAL #3   Title Patient will improve functional LE strength as noted by the ability to perform modified 5x sit to stand test with UE support in 20 seconds or less.    Baseline Met 07/12/20    Time 6    Period Weeks    Status Achieved      PT LONG TERM GOAL #4   Title Patient report ability to walk in community for 10 mins or greater with no rest stops to improve cardiovascular and muscular endurance.    Baseline Met 07/12/20    Time 6    Period Weeks    Status Achieved      PT LONG TERM GOAL #5   Title Patient will reduce the risk of fall as noted by 5+ point improvement on the [facility] Balance Scale.    Baseline increased 1 point today to 52/56 07/14/20    Time 6    Period Weeks    Status Not Met   +2 pt improvement 08/05/2020                Plan - 08/05/20 1218    Clinical Impression Statement Patient still experiencing LBP and heel pain currently but patient understanding of chronic nature. Patient able to achieve all goals except for BERG score. BERG score improved 2 pts total since start of PT.    Personal Factors and Comorbidities Comorbidity 3+;Time since onset of injury/illness/exacerbation    Comorbidities Anxiety, Arthritis, CHF, Depression, HTN, Osteoporosis, DM 2    Examination-Activity Limitations Locomotion Level;Transfers;Stairs;Stand    Examination-Participation Restrictions Laundry    Stability/Clinical Decision Making Stable/Uncomplicated    Rehab Potential Good    PT Frequency 2x / week    PT Duration 6 weeks    PT Treatment/Interventions ADLs/Self Care Home Management;Cryotherapy;Electrical Stimulation;Iontophoresis 32m/ml Dexamethasone;Moist Heat;Ultrasound;Therapeutic exercise;Therapeutic activities;Functional mobility training;Balance training;Neuromuscular re-education;Stair training;Gait training;Patient/family education;Passive range of motion;Manual techniques    PT Next Visit Plan D/C summary required.    PT Home Exercise  Plan see patient education section    Consulted and Agree with Plan of Care Patient           Patient will benefit from skilled therapeutic intervention in order to improve the following deficits and impairments:  Decreased activity tolerance, Decreased balance, Decreased strength, Decreased range of motion, Difficulty walking, Pain, Postural dysfunction  Visit Diagnosis: Muscle weakness (generalized)  Difficulty in walking, not elsewhere classified  Chronic right-sided low back pain, unspecified whether sciatica present     Problem List Patient Active Problem List   Diagnosis Date Noted  . Age-related osteoporosis with current pathological fracture 03/13/2019  . Chronic midline low back pain without sciatica 01/30/2019  . Age-related osteoporosis with current pathol fracture of vertebra ([place] 10/28/2018  . Calcification of aorta (HCC) 10/02/2018  . Compression fracture of T12 vertebra ([place] 10/01/2018  . Primary osteoarthritis of right knee 07/20/2017  . Meniscus degeneration, right 06/14/2017  . Varicose veins of left lower extremity with complications 096/[id number] . Pain medication agreement signed 06/06/2016  . Gout attack 01/11/2016  . OSA (obstructive sleep apnea) 09/14/2015  . Chronic diastolic heart failure ([place] 09/10/2015  . Orthopnea 09/02/2015  . Dysphagia, pharyngoesophageal phase 08/04/2015  . Anorexia 08/04/2015  . Fatigue 08/04/2015  . Malaise and fatigue 08/04/2015  . Depression, recurrent ([place] 04/08/2015  . Osteoarthritis of left knee 09/30/2014  . Vitamin D deficiency   .  Anxiety   . GERD (gastroesophageal reflux disease)   . Arthritis   . Chronic pain of multiple joints   . Hypertension associated with type 2 diabetes mellitus ([place]) 05/15/2013  . Hyperlipidemia associated with type 2 diabetes mellitus ([place]) 05/15/2013  . Type 2 diabetes mellitus with hypercholesterolemia ([place]) 05/15/2013  . Morbid obesity ([place]) 05/15/2013  . Hypothyroidism  05/15/2013   Standley Brooking, PTA 08/05/20 12:21 PM   [facility] [place], [place], [address] Phone: [phone]   Fax:  [phone]  Name: Danielle Parrish MRN: [id number] Date of Birth: 01-02-1941

## 2020-08-09 ENCOUNTER — Other Ambulatory Visit: Payer: Self-pay | Admitting: *Deleted

## 2020-08-09 DIAGNOSIS — I152 Hypertension secondary to endocrine disorders: Secondary | ICD-10-CM

## 2020-08-09 DIAGNOSIS — E1159 Type 2 diabetes mellitus with other circulatory complications: Secondary | ICD-10-CM

## 2020-08-09 MED ORDER — AMLODIPINE BESYLATE 10 MG PO TABS: 10.0000 mg | ORAL_TABLET | Freq: Every day | ORAL | 1 refills | Status: DC

## 2020-08-17 DIAGNOSIS — M79672 Pain in left foot: Secondary | ICD-10-CM | POA: Diagnosis not present

## 2020-08-17 DIAGNOSIS — M722 Plantar fascial fibromatosis: Secondary | ICD-10-CM | POA: Diagnosis not present

## 2020-09-06 ENCOUNTER — Other Ambulatory Visit: Payer: Self-pay | Admitting: *Deleted

## 2020-09-06 DIAGNOSIS — M8000XS Age-related osteoporosis with current pathological fracture, unspecified site, sequela: Secondary | ICD-10-CM

## 2020-09-06 MED ORDER — OYSTER SHELL CALCIUM/D 500-200 MG-UNIT PO TABS: ORAL_TABLET | ORAL | 1 refills | Status: DC

## 2020-09-06 NOTE — Addendum Note (Signed)
Addended by: Antonietta Barcelona D on: 09/06/2020 03:12 PM   Modules accepted: Orders

## 2020-09-07 ENCOUNTER — Ambulatory Visit (INDEPENDENT_AMBULATORY_CARE_PROVIDER_SITE_OTHER): Payer: Medicare Other | Admitting: Family Medicine

## 2020-09-07 ENCOUNTER — Other Ambulatory Visit: Payer: Self-pay

## 2020-09-07 ENCOUNTER — Encounter: Payer: Self-pay | Admitting: Family Medicine

## 2020-09-07 DIAGNOSIS — R0981 Nasal congestion: Secondary | ICD-10-CM

## 2020-09-07 DIAGNOSIS — E1159 Type 2 diabetes mellitus with other circulatory complications: Secondary | ICD-10-CM | POA: Diagnosis not present

## 2020-09-07 DIAGNOSIS — E785 Hyperlipidemia, unspecified: Secondary | ICD-10-CM

## 2020-09-07 DIAGNOSIS — I10 Essential (primary) hypertension: Secondary | ICD-10-CM | POA: Diagnosis not present

## 2020-09-07 DIAGNOSIS — E1169 Type 2 diabetes mellitus with other specified complication: Secondary | ICD-10-CM

## 2020-09-07 DIAGNOSIS — E039 Hypothyroidism, unspecified: Secondary | ICD-10-CM | POA: Diagnosis not present

## 2020-09-07 DIAGNOSIS — E7800 Pure hypercholesterolemia, unspecified: Secondary | ICD-10-CM | POA: Diagnosis not present

## 2020-09-07 MED ORDER — PREDNISONE 20 MG PO TABS: ORAL_TABLET | ORAL | 0 refills | Status: DC

## 2020-09-07 MED ORDER — AMOXICILLIN 500 MG PO CAPS
500.0000 mg | ORAL_CAPSULE | Freq: Two times a day (BID) | ORAL | 0 refills | Status: DC
Start: 2020-09-07 — End: 2021-05-25

## 2020-09-07 NOTE — Progress Notes (Signed)
Virtual Visit via telephone Note  I connected with Danielle Parrish on 09/07/20 at 1030 by telephone and verified that I am speaking with the correct person using two identifiers. Danielle Parrish is currently located at home and patient are currently with her during visit. The provider, Fransisca Kaufmann Alarik Radu, MD is located in their office at time of visit.  Call ended at 1042  I discussed the limitations, risks, security and privacy concerns of performing an evaluation and management service by telephone and the availability of in person appointments. I also discussed with the patient that there may be a patient responsible charge related to this service. The patient expressed understanding and agreed to proceed.  Hr 89 O2 92 History and Present Illness: Type 2 diabetes mellitus Patient comes in today for recheck of his diabetes. Patient has been currently taking no medication and is virtual. Patient is currently on an ACE inhibitor/ARB. Patient has seen an ophthalmologist this year. Patient denies any issues with their feet. The symptom started onset as an adult htn and osa and ARE RELATED TO DM   Hypertension Patient is currently on lisinopril and amlodipine, and their blood pressure today is 150/84. Patient denies any lightheadedness or dizziness. Patient denies headaches, blurred vision, chest pains, shortness of breath, or weakness. Denies any side effects from medication and is content with current medication.   Hyperlipidemia Patient is coming in for recheck of his hyperlipidemia. The patient is currently taking lovastatin. They deny any issues with myalgias or history of liver damage from it. They deny any focal numbness or weakness or chest pain.   Hypothyroidism recheck Patient is coming in for thyroid recheck today as well. They deny any issues with hair changes or heat or cold problems or diarrhea or constipation. They deny any chest pain or palpitations. They are currently on  levothyroxine 151micrograms   Patient is having congestion and irritation and sinus pressure and cough and achiness but denies shortness of breath and wheezing. She denies any covid contacts.   1. Type 2 diabetes mellitus with hypercholesterolemia ([place])   2. Acquired hypothyroidism   3. Hypertension associated with type 2 diabetes mellitus ([place])   4. Hyperlipidemia associated with type 2 diabetes mellitus ([place])   5. Sinus congestion     Outpatient Encounter Medications as of 09/07/2020  Medication Sig  . acetaminophen (TYLENOL) 500 MG tablet Take 1 tablet (500 mg total) by mouth every 6 (six) hours as needed.  Marland Kitchen albuterol (VENTOLIN HFA) 108 (90 Base) MCG/ACT inhaler TAKE 2 PUFFS BY MOUTH EVERY 6 HOURS AS NEEDED FOR WHEEZE OR SHORTNESS OF BREATH  . amLODipine (NORVASC) 10 MG tablet Take 1 tablet (10 mg total) by mouth daily.  Marland Kitchen amoxicillin (AMOXIL) 500 MG capsule Take 1 capsule (500 mg total) by mouth 2 (two) times daily.  Marland Kitchen aspirin 81 MG chewable tablet Chew 4 tablets by mouth daily.   . Biotin 10 MG CAPS Take 1 capsule by mouth daily.   . calcium-vitamin D (OSCAL WITH D) 500-200 MG-UNIT TABS tablet TAKE 1 TABLET BY MOUTH EVERY DAY WITH BREAKFAST  . Cholecalciferol (VITAMIN D-3 PO) Take 1 capsule by mouth daily.   . cyclobenzaprine (FLEXERIL) 5 MG tablet Take 1 tablet (5 mg total) by mouth 3 (three) times daily as needed for muscle spasms.  Marland Kitchen denosumab (PROLIA) 60 MG/ML SOSY injection Bring to your doctor to inject 60 mg subcutaneously.  . diclofenac Sodium (VOLTAREN) 1 % GEL Apply 4 g topically 4 (four) times  daily.  . diphenhydrAMINE (BENADRYL) 25 MG tablet Take 50 mg by mouth at bedtime as needed for allergies.   Marland Kitchen gabapentin (NEURONTIN) 100 MG capsule Take 2 capsules (200 mg total) by mouth 3 (three) times daily.  Marland Kitchen ketoconazole (NIZORAL) 2 % cream APPLY TO AFFECTED AREA EVERY DAY  . levothyroxine (SYNTHROID) 100 MCG tablet Take 1 Tablet by mouth once daily BEFORE BREAKFAST  .  lidocaine (LIDODERM) 5 % Place 1 patch onto the skin daily. Remove & Discard patch within 12 hours or as directed by MD  . lisinopril (ZESTRIL) 20 MG tablet Take 1 tablet (20 mg total) by mouth daily.  Marland Kitchen lovastatin (MEVACOR) 40 MG tablet Take 1 tablet (40 mg total) by mouth at bedtime.  . mirabegron ER (MYRBETRIQ) 25 MG TB24 tablet Take 1 tablet (25 mg total) by mouth daily.  . Multiple Vitamin (MULTI-VITAMIN) tablet Take by mouth.  . Multiple Vitamins-Minerals (PRESERVISION AREDS 2 PO) Take by mouth.  . nystatin (MYCOSTATIN/NYSTOP) powder Apply topically 4 (four) times daily.  Marland Kitchen nystatin cream (MYCOSTATIN) Apply 1 application topically 2 (two) times daily.  Marland Kitchen omeprazole (PRILOSEC) 20 MG capsule TAKE 1 CAPSULE (20 MG TOTAL) BY MOUTH 2 (TWO) TIMES DAILY BEFORE A MEAL.  . predniSONE (DELTASONE) 20 MG tablet 2 po at same time daily for 5 days  . pyridOXINE (VITAMIN B-6) 100 MG tablet Take 100 mg by mouth daily.  . sertraline (ZOLOFT) 100 MG tablet TAKE 2 TABLETS BY MOUTH EVERY DAY  . sodium chloride (OCEAN) 0.65 % SOLN nasal spray Place 1 spray into both nostrils as needed for congestion.  . [DISCONTINUED] ketoconazole (NIZORAL) 2 % cream APPLY TO AFFECTED AREA EVERY DAY   No facility-administered encounter medications on file as of 09/07/2020.    Review of Systems  Constitutional: Negative for chills and fever.  HENT: Positive for postnasal drip, rhinorrhea, sinus pressure, sneezing and sore throat. Negative for congestion, ear discharge and ear pain.   Eyes: Negative for pain, redness and visual disturbance.  Respiratory: Negative for chest tightness and shortness of breath.   Cardiovascular: Negative for chest pain and leg swelling.  Genitourinary: Negative for difficulty urinating and dysuria.  Musculoskeletal: Negative for back pain and gait problem.  Skin: Negative for rash.  Neurological: Negative for light-headedness and headaches.  Psychiatric/Behavioral: Negative for agitation and  behavioral problems.  All other systems reviewed and are negative.   Observations/Objective: Patient sounds comfortable and In no acute distress  Assessment and Plan: Problem List Items Addressed This Visit      Cardiovascular and Mediastinum   Hypertension associated with type 2 diabetes mellitus ([place])     Endocrine   Hyperlipidemia associated with type 2 diabetes mellitus ([place])   Type 2 diabetes mellitus with hypercholesterolemia ([place]) - Primary   Hypothyroidism    Other Visit Diagnoses    Sinus congestion       Relevant Medications   predniSONE (DELTASONE) 20 MG tablet      Will treat sinus congestion and give amoxicillin and prednisone. Follow up plan: Return in about 3 months (around 12/07/2020), or if symptoms worsen or fail to improve, for diabetes.     I discussed the assessment and treatment plan with the patient. The patient was provided an opportunity to ask questions and all were answered. The patient agreed with the plan and demonstrated an understanding of the instructions.   The patient was advised to call back or seek an in-person evaluation if the symptoms worsen or if the condition  fails to improve as anticipated.  The above assessment and management plan was discussed with the patient. The patient verbalized understanding of and has agreed to the management plan. Patient is aware to call the clinic if symptoms persist or worsen. Patient is aware when to return to the clinic for a follow-up visit. Patient educated on when it is appropriate to go to the emergency department.    I provided 12 minutes of non-face-to-face time during this encounter.    Worthy Rancher, MD

## 2020-09-14 ENCOUNTER — Other Ambulatory Visit: Payer: Self-pay | Admitting: Family Medicine

## 2020-09-14 DIAGNOSIS — B3749 Other urogenital candidiasis: Secondary | ICD-10-CM

## 2020-09-15 MED ORDER — NYSTATIN 100000 UNIT/GM EX CREA: 1.0000 "application " | TOPICAL_CREAM | Freq: Two times a day (BID) | CUTANEOUS | 99 refills | Status: DC

## 2020-09-15 NOTE — Progress Notes (Signed)
Pt is at home  Provider is in office at [facility]

## 2020-09-17 ENCOUNTER — Other Ambulatory Visit: Payer: Self-pay | Admitting: *Deleted

## 2020-09-17 DIAGNOSIS — E785 Hyperlipidemia, unspecified: Secondary | ICD-10-CM

## 2020-09-17 DIAGNOSIS — E1169 Type 2 diabetes mellitus with other specified complication: Secondary | ICD-10-CM

## 2020-09-17 MED ORDER — LOVASTATIN 40 MG PO TABS: 40.0000 mg | ORAL_TABLET | Freq: Every day | ORAL | 1 refills | Status: DC

## 2020-09-23 ENCOUNTER — Telehealth: Payer: Self-pay | Admitting: Family Medicine

## 2020-09-23 ENCOUNTER — Other Ambulatory Visit: Payer: Self-pay | Admitting: Family Medicine

## 2020-09-23 MED ORDER — FLUCONAZOLE 150 MG PO TABS
150.0000 mg | ORAL_TABLET | Freq: Once | ORAL | 0 refills | Status: AC
Start: 2020-09-23 — End: 2020-09-23

## 2020-09-23 NOTE — Telephone Encounter (Signed)
Antibiotic given by you 09/07/20- please address

## 2020-09-23 NOTE — Telephone Encounter (Signed)
PATIENT AWARE

## 2020-09-23 NOTE — Telephone Encounter (Signed)
Sent diflucan

## 2020-09-29 DIAGNOSIS — N390 Urinary tract infection, site not specified: Secondary | ICD-10-CM | POA: Diagnosis not present

## 2020-10-18 ENCOUNTER — Other Ambulatory Visit: Payer: Self-pay

## 2020-10-18 MED ORDER — DENOSUMAB 60 MG/ML ~~LOC~~ SOSY: PREFILLED_SYRINGE | SUBCUTANEOUS | 0 refills | Status: DC

## 2020-10-20 ENCOUNTER — Telehealth: Payer: Self-pay

## 2020-10-20 DIAGNOSIS — H04123 Dry eye syndrome of bilateral lacrimal glands: Secondary | ICD-10-CM | POA: Diagnosis not present

## 2020-10-20 DIAGNOSIS — H43811 Vitreous degeneration, right eye: Secondary | ICD-10-CM | POA: Diagnosis not present

## 2020-10-20 DIAGNOSIS — H26493 Other secondary cataract, bilateral: Secondary | ICD-10-CM | POA: Diagnosis not present

## 2020-10-21 DIAGNOSIS — G4733 Obstructive sleep apnea (adult) (pediatric): Secondary | ICD-10-CM | POA: Diagnosis not present

## 2020-10-25 NOTE — Telephone Encounter (Signed)
[place] patient - Please make sure everything is set up for patient to get shot and schedule.

## 2020-10-25 NOTE — Telephone Encounter (Signed)
Pt calling about for her prolia shot. Can she schedule? Please call back

## 2020-10-25 NOTE — Telephone Encounter (Signed)
Pt called to let nurse know to call her back to schedule prolia shot @ [phone]

## 2020-10-26 NOTE — Telephone Encounter (Signed)
Pt will come 10/27/2020 for nurse visit. Injection is in the fridge.

## 2020-10-27 ENCOUNTER — Other Ambulatory Visit: Payer: Self-pay

## 2020-10-27 ENCOUNTER — Ambulatory Visit: Payer: Medicare Other

## 2020-11-05 DIAGNOSIS — Z20822 Contact with and (suspected) exposure to covid-19: Secondary | ICD-10-CM | POA: Diagnosis not present

## 2020-11-08 ENCOUNTER — Ambulatory Visit: Payer: Medicare Other

## 2020-11-08 ENCOUNTER — Other Ambulatory Visit: Payer: Self-pay | Admitting: Family Medicine

## 2020-11-08 DIAGNOSIS — M1711 Unilateral primary osteoarthritis, right knee: Secondary | ICD-10-CM

## 2020-11-22 DIAGNOSIS — H527 Unspecified disorder of refraction: Secondary | ICD-10-CM | POA: Diagnosis not present

## 2020-11-22 DIAGNOSIS — M170 Bilateral primary osteoarthritis of knee: Secondary | ICD-10-CM | POA: Diagnosis not present

## 2020-11-29 DIAGNOSIS — M170 Bilateral primary osteoarthritis of knee: Secondary | ICD-10-CM | POA: Diagnosis not present

## 2020-12-02 DIAGNOSIS — H9193 Unspecified hearing loss, bilateral: Secondary | ICD-10-CM | POA: Diagnosis not present

## 2020-12-02 DIAGNOSIS — J329 Chronic sinusitis, unspecified: Secondary | ICD-10-CM | POA: Diagnosis not present

## 2020-12-02 DIAGNOSIS — M722 Plantar fascial fibromatosis: Secondary | ICD-10-CM | POA: Diagnosis not present

## 2020-12-02 DIAGNOSIS — M79672 Pain in left foot: Secondary | ICD-10-CM | POA: Diagnosis not present

## 2020-12-02 DIAGNOSIS — J029 Acute pharyngitis, unspecified: Secondary | ICD-10-CM | POA: Diagnosis not present

## 2020-12-02 DIAGNOSIS — R0981 Nasal congestion: Secondary | ICD-10-CM | POA: Diagnosis not present

## 2020-12-06 ENCOUNTER — Telehealth: Payer: Self-pay

## 2020-12-06 DIAGNOSIS — M170 Bilateral primary osteoarthritis of knee: Secondary | ICD-10-CM | POA: Diagnosis not present

## 2020-12-06 NOTE — Telephone Encounter (Signed)
Danielle Parrish, see message.  Please check to see if patient's Prolia is here and if her paperwork has been done before she is scheduled.

## 2020-12-14 NOTE — Telephone Encounter (Signed)
Pts Prolia is here in the office. Benefits have been approved. No prior authorization was required.  Pt is scheduled for nurse visit on 12/20/2020. She has been made aware.

## 2020-12-20 ENCOUNTER — Ambulatory Visit (INDEPENDENT_AMBULATORY_CARE_PROVIDER_SITE_OTHER): Payer: Medicare Other | Admitting: *Deleted

## 2020-12-20 ENCOUNTER — Other Ambulatory Visit: Payer: Self-pay

## 2020-12-20 DIAGNOSIS — M8008XS Age-related osteoporosis with current pathological fracture, vertebra(e), sequela: Secondary | ICD-10-CM

## 2020-12-20 DIAGNOSIS — M810 Age-related osteoporosis without current pathological fracture: Secondary | ICD-10-CM | POA: Diagnosis not present

## 2020-12-20 MED ORDER — DENOSUMAB 60 MG/ML ~~LOC~~ SOSY
60.0000 mg | PREFILLED_SYRINGE | Freq: Once | SUBCUTANEOUS | Status: AC
  Administered 2020-12-20: 60 mg via SUBCUTANEOUS

## 2020-12-20 NOTE — Progress Notes (Signed)
Patient in today for Prolia injection. 60 mg given SQ in left arm. Patient tolerated well.  

## 2020-12-23 ENCOUNTER — Other Ambulatory Visit: Payer: Self-pay | Admitting: Family Medicine

## 2020-12-23 DIAGNOSIS — E1169 Type 2 diabetes mellitus with other specified complication: Secondary | ICD-10-CM

## 2020-12-23 DIAGNOSIS — E785 Hyperlipidemia, unspecified: Secondary | ICD-10-CM

## 2020-12-28 ENCOUNTER — Other Ambulatory Visit: Payer: Self-pay | Admitting: Family Medicine

## 2020-12-28 DIAGNOSIS — M1711 Unilateral primary osteoarthritis, right knee: Secondary | ICD-10-CM

## 2020-12-30 ENCOUNTER — Other Ambulatory Visit: Payer: Self-pay | Admitting: *Deleted

## 2020-12-30 DIAGNOSIS — E039 Hypothyroidism, unspecified: Secondary | ICD-10-CM

## 2020-12-30 MED ORDER — LEVOTHYROXINE SODIUM 100 MCG PO TABS: ORAL_TABLET | ORAL | 1 refills | Status: DC

## 2021-01-06 DIAGNOSIS — H905 Unspecified sensorineural hearing loss: Secondary | ICD-10-CM | POA: Diagnosis not present

## 2021-01-17 ENCOUNTER — Other Ambulatory Visit: Payer: Self-pay | Admitting: Family Medicine

## 2021-01-17 DIAGNOSIS — Z1231 Encounter for screening mammogram for malignant neoplasm of breast: Secondary | ICD-10-CM

## 2021-01-18 ENCOUNTER — Other Ambulatory Visit: Payer: Self-pay | Admitting: Family Medicine

## 2021-01-18 DIAGNOSIS — E1159 Type 2 diabetes mellitus with other circulatory complications: Secondary | ICD-10-CM

## 2021-01-20 ENCOUNTER — Other Ambulatory Visit: Payer: Self-pay | Admitting: *Deleted

## 2021-01-20 DIAGNOSIS — I152 Hypertension secondary to endocrine disorders: Secondary | ICD-10-CM

## 2021-01-20 DIAGNOSIS — E1159 Type 2 diabetes mellitus with other circulatory complications: Secondary | ICD-10-CM

## 2021-01-20 MED ORDER — LISINOPRIL 20 MG PO TABS: 20.0000 mg | ORAL_TABLET | Freq: Every day | ORAL | 0 refills | Status: DC

## 2021-02-09 ENCOUNTER — Other Ambulatory Visit: Payer: Self-pay | Admitting: Family Medicine

## 2021-02-09 DIAGNOSIS — M1711 Unilateral primary osteoarthritis, right knee: Secondary | ICD-10-CM

## 2021-02-28 ENCOUNTER — Other Ambulatory Visit: Payer: Self-pay

## 2021-02-28 ENCOUNTER — Ambulatory Visit
Admission: RE | Admit: 2021-02-28 | Discharge: 2021-02-28 | Disposition: A | Discharge status: Home / Self Care | Payer: Medicare Other | Source: Ambulatory Visit | Attending: Family Medicine | Admitting: Family Medicine

## 2021-02-28 DIAGNOSIS — Z1231 Encounter for screening mammogram for malignant neoplasm of breast: Secondary | ICD-10-CM | POA: Diagnosis not present

## 2021-03-24 ENCOUNTER — Other Ambulatory Visit: Payer: Self-pay | Admitting: Family Medicine

## 2021-03-24 DIAGNOSIS — F339 Major depressive disorder, recurrent, unspecified: Secondary | ICD-10-CM

## 2021-03-29 DIAGNOSIS — M79672 Pain in left foot: Secondary | ICD-10-CM | POA: Diagnosis not present

## 2021-03-29 DIAGNOSIS — M722 Plantar fascial fibromatosis: Secondary | ICD-10-CM | POA: Diagnosis not present

## 2021-03-31 ENCOUNTER — Other Ambulatory Visit: Payer: Self-pay | Admitting: Family Medicine

## 2021-04-03 ENCOUNTER — Other Ambulatory Visit: Payer: Self-pay | Admitting: Family Medicine

## 2021-04-03 DIAGNOSIS — M1711 Unilateral primary osteoarthritis, right knee: Secondary | ICD-10-CM

## 2021-04-07 ENCOUNTER — Telehealth: Payer: Self-pay

## 2021-04-07 NOTE — Telephone Encounter (Signed)
Pt having same issues of sleeping all the time & just tired, hasn't had her labwork in awhile Appt made for 04/21/21

## 2021-04-17 ENCOUNTER — Other Ambulatory Visit: Payer: Self-pay | Admitting: Family Medicine

## 2021-04-17 DIAGNOSIS — E1159 Type 2 diabetes mellitus with other circulatory complications: Secondary | ICD-10-CM

## 2021-04-18 ENCOUNTER — Other Ambulatory Visit: Payer: Self-pay | Admitting: Family Medicine

## 2021-04-18 DIAGNOSIS — E785 Hyperlipidemia, unspecified: Secondary | ICD-10-CM

## 2021-04-18 DIAGNOSIS — F339 Major depressive disorder, recurrent, unspecified: Secondary | ICD-10-CM

## 2021-04-18 DIAGNOSIS — R062 Wheezing: Secondary | ICD-10-CM

## 2021-04-18 DIAGNOSIS — E039 Hypothyroidism, unspecified: Secondary | ICD-10-CM

## 2021-04-18 DIAGNOSIS — E1169 Type 2 diabetes mellitus with other specified complication: Secondary | ICD-10-CM

## 2021-04-18 MED ORDER — ALBUTEROL SULFATE HFA 108 (90 BASE) MCG/ACT IN AERS: INHALATION_SPRAY | RESPIRATORY_TRACT | 0 refills | Status: DC

## 2021-04-18 NOTE — Addendum Note (Signed)
Addended by: Antonietta Barcelona D on: 04/18/2021 04:07 PM   Modules accepted: Orders

## 2021-04-20 NOTE — Telephone Encounter (Signed)
HOLD - pt last prolia was 12/20/20. Due again 06/20/21

## 2021-04-21 ENCOUNTER — Other Ambulatory Visit: Payer: Self-pay

## 2021-04-21 ENCOUNTER — Encounter: Payer: Self-pay | Admitting: Family Medicine

## 2021-04-21 ENCOUNTER — Ambulatory Visit (INDEPENDENT_AMBULATORY_CARE_PROVIDER_SITE_OTHER): Payer: Medicare Other | Admitting: Family Medicine

## 2021-04-21 VITALS — BP 136/84 | HR 81 | Ht 63.0 in | Wt 189.0 lb

## 2021-04-21 DIAGNOSIS — I152 Hypertension secondary to endocrine disorders: Secondary | ICD-10-CM

## 2021-04-21 DIAGNOSIS — M545 Low back pain, unspecified: Secondary | ICD-10-CM | POA: Diagnosis not present

## 2021-04-21 DIAGNOSIS — B3749 Other urogenital candidiasis: Secondary | ICD-10-CM | POA: Diagnosis not present

## 2021-04-21 DIAGNOSIS — F339 Major depressive disorder, recurrent, unspecified: Secondary | ICD-10-CM

## 2021-04-21 DIAGNOSIS — E1159 Type 2 diabetes mellitus with other circulatory complications: Secondary | ICD-10-CM | POA: Diagnosis not present

## 2021-04-21 DIAGNOSIS — E039 Hypothyroidism, unspecified: Secondary | ICD-10-CM | POA: Diagnosis not present

## 2021-04-21 DIAGNOSIS — E7800 Pure hypercholesterolemia, unspecified: Secondary | ICD-10-CM | POA: Diagnosis not present

## 2021-04-21 DIAGNOSIS — E1169 Type 2 diabetes mellitus with other specified complication: Secondary | ICD-10-CM | POA: Diagnosis not present

## 2021-04-21 DIAGNOSIS — G8929 Other chronic pain: Secondary | ICD-10-CM | POA: Diagnosis not present

## 2021-04-21 DIAGNOSIS — B373 Candidiasis of vulva and vagina: Secondary | ICD-10-CM

## 2021-04-21 DIAGNOSIS — K219 Gastro-esophageal reflux disease without esophagitis: Secondary | ICD-10-CM | POA: Diagnosis not present

## 2021-04-21 DIAGNOSIS — E785 Hyperlipidemia, unspecified: Secondary | ICD-10-CM

## 2021-04-21 DIAGNOSIS — B3731 Acute candidiasis of vulva and vagina: Secondary | ICD-10-CM

## 2021-04-21 MED ORDER — LISINOPRIL 20 MG PO TABS
20.0000 mg | ORAL_TABLET | Freq: Every day | ORAL | 3 refills | Status: DC
Start: 2021-04-21 — End: 2022-06-19

## 2021-04-21 MED ORDER — OMEPRAZOLE 20 MG PO CPDR: 20.0000 mg | DELAYED_RELEASE_CAPSULE | Freq: Two times a day (BID) | ORAL | 1 refills | Status: DC

## 2021-04-21 MED ORDER — DENOSUMAB 60 MG/ML ~~LOC~~ SOSY: PREFILLED_SYRINGE | SUBCUTANEOUS | 3 refills | Status: DC

## 2021-04-21 MED ORDER — KETOCONAZOLE 2 % EX CREA: TOPICAL_CREAM | CUTANEOUS | 2 refills | Status: DC

## 2021-04-21 MED ORDER — AMLODIPINE BESYLATE 10 MG PO TABS: 1.0000 | ORAL_TABLET | Freq: Every day | ORAL | 3 refills | Status: DC

## 2021-04-21 MED ORDER — SERTRALINE HCL 100 MG PO TABS: 200.0000 mg | ORAL_TABLET | Freq: Every day | ORAL | 3 refills | Status: DC

## 2021-04-21 MED ORDER — FLUCONAZOLE 150 MG PO TABS: 150.0000 mg | ORAL_TABLET | Freq: Once | ORAL | 0 refills | Status: AC

## 2021-04-21 MED ORDER — LOVASTATIN 40 MG PO TABS
ORAL_TABLET | ORAL | 3 refills | Status: DC
Start: 2021-04-21 — End: 2021-11-28

## 2021-04-21 MED ORDER — LEVOTHYROXINE SODIUM 100 MCG PO TABS
100.0000 ug | ORAL_TABLET | Freq: Every day | ORAL | 3 refills | Status: DC
Start: 2021-04-21 — End: 2022-06-01

## 2021-04-21 NOTE — Progress Notes (Signed)
BP 136/84   Pulse 81   Ht '5\' 3"'  (1.6 m)   Wt 189 lb (85.7 kg)   SpO2 91%   BMI 33.48 kg/m    Subjective:   Patient ID: Danielle Parrish, female    DOB: 05/07/1941, 80 y.o.   MRN: [id number]  HPI: Danielle Parrish is a 80 y.o. female presenting on 04/21/2021 for Medical Management of Chronic Issues and Fatigue   HPI Depression anxiety and fatigue Patient is coming today complaining of depression and anxiety and fatigue that has been worsening recently.  She has been dealing with the loss of her brother who passed away at 7.  Patient denies any suicidal ideations or thoughts of hurting himself.  She ran out of her Zoloft and that was a little bit now she got COVID and she is feeling better again.  She also takes gabapentin for neuropathy in her back and legs from her back pain.  Hypertension Patient is currently on amlodipine and lisinopril, and their blood pressure today is 136/84. Patient denies any lightheadedness or dizziness. Patient denies headaches, blurred vision, chest pains, shortness of breath, or weakness. Denies any side effects from medication and is content with current medication.   Hyperlipidemia Patient is coming in for recheck of his hyperlipidemia. The patient is currently taking lovastatin. They deny any issues with myalgias or history of liver damage from it. They deny any focal numbness or weakness or chest pain.   GERD Patient is currently on omeprazole.  She denies any major symptoms or abdominal pain or belching or burping. She denies any blood in her stool or lightheadedness or dizziness.   Patient deals with yeast infections because she is on chronic antibiotics and wants a yeast pill for this.  Patient deals with back pain and weakness in her legs and has had some falls and wants to go back to physical therapy to help strengthen and help with some of the pains.  Relevant past medical, surgical, family and social history reviewed and updated as  indicated. Interim medical history since our last visit reviewed. Allergies and medications reviewed and updated.  Review of Systems  Constitutional: Negative for chills and fever.  HENT: Negative for congestion, ear discharge and ear pain.   Eyes: Negative for redness and visual disturbance.  Respiratory: Negative for chest tightness and shortness of breath.   Cardiovascular: Negative for chest pain and leg swelling.  Genitourinary: Negative for difficulty urinating and dysuria.  Musculoskeletal: Positive for arthralgias, back pain and gait problem.  Skin: Negative for rash.  Neurological: Positive for weakness. Negative for dizziness, light-headedness and headaches.  Psychiatric/Behavioral: Positive for dysphoric mood and sleep disturbance. Negative for agitation, behavioral problems and self-injury. The patient is nervous/anxious.   All other systems reviewed and are negative.   Per HPI unless specifically indicated above   Allergies as of 04/21/2021      Reactions   Crestor [rosuvastatin] Other (See Comments)   Body aches.    Nsaids    Burns stomach.    Vytorin [ezetimibe-simvastatin]    Body aches.    Tolmetin Nausea Only   Burns stomach.       Medication List       Accurate as of April 21, 2021  4:34 PM. If you have any questions, ask your nurse or doctor.        STOP taking these medications   Biotin 10 MG Caps Stopped by: Fransisca Kaufmann Deshay Blumenfeld, MD   predniSONE 20 MG tablet  Commonly known as: DELTASONE Stopped by: Worthy Rancher, MD     TAKE these medications   acetaminophen 500 MG tablet Commonly known as: TYLENOL Take 1 tablet (500 mg total) by mouth every 6 (six) hours as needed.   albuterol 108 (90 Base) MCG/ACT inhaler Commonly known as: VENTOLIN HFA TAKE 2 PUFFS BY MOUTH EVERY 6 HOURS AS NEEDED FOR WHEEZE OR SHORTNESS OF BREATH   amLODipine 10 MG tablet Commonly known as: NORVASC TAKE 1 TABLET BY MOUTH EVERY DAY   amoxicillin 500 MG  capsule Commonly known as: AMOXIL Take 1 capsule (500 mg total) by mouth 2 (two) times daily.   aspirin 81 MG chewable tablet Chew 4 tablets by mouth daily.   calcium-vitamin D 500-200 MG-UNIT Tabs tablet Commonly known as: OSCAL WITH D TAKE 1 TABLET BY MOUTH EVERY DAY WITH BREAKFAST   cyclobenzaprine 5 MG tablet Commonly known as: FLEXERIL Take 1 tablet (5 mg total) by mouth 3 (three) times daily as needed for muscle spasms.   denosumab 60 MG/ML Sosy injection Commonly known as: PROLIA Bring to your doctor to inject 60 mg subcutaneously.   diclofenac Sodium 1 % Gel Commonly known as: VOLTAREN APPLY 4 GRAMS TOPICALLY 4 (FOUR) TIMES DAILY.   diphenhydrAMINE 25 MG tablet Commonly known as: BENADRYL Take 50 mg by mouth at bedtime as needed for allergies.   gabapentin 100 MG capsule Commonly known as: NEURONTIN Take 2 capsules (200 mg total) by mouth 3 (three) times daily.   ketoconazole 2 % cream Commonly known as: NIZORAL APPLY TO AFFECTED AREA EVERY DAY   levothyroxine 100 MCG tablet Commonly known as: SYNTHROID TAKE 1 TABLET BY MOUTH ONCE DAILY BEFORE BREAKFAST   lidocaine 5 % Commonly known as: Lidoderm Place 1 patch onto the skin daily. Remove & Discard patch within 12 hours or as directed by MD   lisinopril 20 MG tablet Commonly known as: ZESTRIL Take 1 tablet (20 mg total) by mouth daily.   lovastatin 40 MG tablet Commonly known as: MEVACOR TAKE 1 TABLET BY MOUTH EVERYDAY AT BEDTIME   mirabegron ER 25 MG Tb24 tablet Commonly known as: Myrbetriq Take 1 tablet (25 mg total) by mouth daily.   Multi-Vitamin tablet Take by mouth.   nystatin powder Commonly known as: MYCOSTATIN/NYSTOP Apply topically 4 (four) times daily.   nystatin cream Commonly known as: MYCOSTATIN Apply 1 application topically 2 (two) times daily.   omeprazole 20 MG capsule Commonly known as: PRILOSEC TAKE 1 CAPSULE (20 MG TOTAL) BY MOUTH 2 (TWO) TIMES DAILY BEFORE A MEAL.    PRESERVISION AREDS 2 PO Take by mouth.   pyridOXINE 100 MG tablet Commonly known as: VITAMIN B-6 Take 100 mg by mouth daily.   sertraline 100 MG tablet Commonly known as: ZOLOFT Take 2 tablets (200 mg total) by mouth daily.   sodium chloride 0.65 % Soln nasal spray Commonly known as: OCEAN Place 1 spray into both nostrils as needed for congestion.   VITAMIN D-3 PO Take 1 capsule by mouth daily.        Objective:   BP 136/84   Pulse 81   Ht '5\' 3"'  (1.6 m)   Wt 189 lb (85.7 kg)   SpO2 91%   BMI 33.48 kg/m   Wt Readings from Last 3 Encounters:  04/21/21 189 lb (85.7 kg)  06/07/20 187 lb (84.8 kg)  05/14/20 192 lb (87.1 kg)    Physical Exam Vitals and nursing note reviewed.  Constitutional:  General: She is not in acute distress.    Appearance: She is well-developed. She is not diaphoretic.  Eyes:     Conjunctiva/sclera: Conjunctivae normal.  Cardiovascular:     Rate and Rhythm: Normal rate and regular rhythm.     Heart sounds: Normal heart sounds. No murmur heard.   Pulmonary:     Effort: Pulmonary effort is normal. No respiratory distress.     Breath sounds: Normal breath sounds. No wheezing.  Musculoskeletal:        General: Tenderness (Bilateral lumbar pain) present. Normal range of motion.  Skin:    General: Skin is warm and dry.     Findings: No rash.  Neurological:     Mental Status: She is alert and oriented to person, place, and time.     Coordination: Coordination normal.  Psychiatric:        Behavior: Behavior normal.       Assessment & Plan:   Problem List Items Addressed This Visit      Cardiovascular and Mediastinum   Hypertension associated with type 2 diabetes mellitus (HCC)   Relevant Medications   amLODipine (NORVASC) 10 MG tablet   lisinopril (ZESTRIL) 20 MG tablet   lovastatin (MEVACOR) 40 MG tablet   Other Relevant Orders   CBC with Differential/Platelet   CMP14+EGFR   Lipid panel     Digestive   GERD  (gastroesophageal reflux disease)   Relevant Medications   omeprazole (PRILOSEC) 20 MG capsule     Endocrine   Hyperlipidemia associated with type 2 diabetes mellitus (HCC)   Relevant Medications   lisinopril (ZESTRIL) 20 MG tablet   lovastatin (MEVACOR) 40 MG tablet   Other Relevant Orders   Lipid panel   Type 2 diabetes mellitus with hypercholesterolemia (HCC) - Primary   Relevant Medications   amLODipine (NORVASC) 10 MG tablet   lisinopril (ZESTRIL) 20 MG tablet   lovastatin (MEVACOR) 40 MG tablet   Other Relevant Orders   CBC with Differential/Platelet   CMP14+EGFR   Bayer DCA Hb A1c Waived   Hypothyroidism   Relevant Medications   levothyroxine (SYNTHROID) 100 MCG tablet   Other Relevant Orders   TSH     Other   Depression, recurrent (HCC)   Relevant Medications   sertraline (ZOLOFT) 100 MG tablet   Chronic midline low back pain without sciatica   Relevant Orders   Ambulatory referral to Physical Therapy    Other Visit Diagnoses    Candidiasis of perineum       Relevant Medications   ketoconazole (NIZORAL) 2 % cream   fluconazole (DIFLUCAN) 150 MG tablet   Yeast vaginitis       Relevant Medications   ketoconazole (NIZORAL) 2 % cream   fluconazole (DIFLUCAN) 150 MG tablet      Refer to physical therapy because of chronic pain and difficulties with walking, fall prevention Patient finds off-and-on yeast infections because she takes an antibiotic for the urinary prevention, sent Diflucan for her. Follow up plan: Return in about 3 months (around 07/21/2021), or if symptoms worsen or fail to improve, for Hypertension and thyroid and cholesterol recheck.  Counseling provided for all of the vaccine components No orders of the defined types were placed in this encounter.   Caryl Pina, MD [facility] 04/21/2021, 4:34 PM

## 2021-04-22 LAB — CBC WITH DIFFERENTIAL/PLATELET
Basophils Absolute: 0 10*3/uL (ref 0.0–0.2)
Basos: 0 %
EOS (ABSOLUTE): 0 10*3/uL (ref 0.0–0.4)
Eos: 1 %
Hematocrit: 41.8 % (ref 34.0–46.6)
Hemoglobin: 14.2 g/dL (ref 11.1–15.9)
Immature Grans (Abs): 0 10*3/uL (ref 0.0–0.1)
Immature Granulocytes: 0 %
Lymphocytes Absolute: 1.8 10*3/uL (ref 0.7–3.1)
Lymphs: 27 %
MCH: 31.8 pg (ref 26.6–33.0)
MCHC: 34 g/dL (ref 31.5–35.7)
MCV: 94 fL (ref 79–97)
Monocytes Absolute: 0.7 10*3/uL (ref 0.1–0.9)
Monocytes: 11 %
Neutrophils Absolute: 4.2 10*3/uL (ref 1.4–7.0)
Neutrophils: 61 %
Platelets: 195 10*3/uL (ref 150–450)
RBC: 4.46 x10E6/uL (ref 3.77–5.28)
RDW: 12.3 % (ref 11.7–15.4)
WBC: 6.8 10*3/uL (ref 3.4–10.8)

## 2021-04-22 LAB — CMP14+EGFR
ALT: 20 IU/L (ref 0–32)
AST: 16 IU/L (ref 0–40)
Albumin/Globulin Ratio: 2.2 (ref 1.2–2.2)
Albumin: 4.8 g/dL — ABNORMAL HIGH (ref 3.7–4.7)
Alkaline Phosphatase: 69 IU/L (ref 44–121)
BUN/Creatinine Ratio: 15 (ref 12–28)
BUN: 13 mg/dL (ref 8–27)
Bilirubin Total: 0.4 mg/dL (ref 0.0–1.2)
CO2: 25 mmol/L (ref 20–29)
Calcium: 10.6 mg/dL — ABNORMAL HIGH (ref 8.7–10.3)
Chloride: 98 mmol/L (ref 96–106)
Creatinine, Ser: 0.86 mg/dL (ref 0.57–1.00)
Globulin, Total: 2.2 g/dL (ref 1.5–4.5)
Glucose: 111 mg/dL — ABNORMAL HIGH (ref 65–99)
Potassium: 5.1 mmol/L (ref 3.5–5.2)
Sodium: 139 mmol/L (ref 134–144)
Total Protein: 7 g/dL (ref 6.0–8.5)
eGFR: 68 mL/min/{1.73_m2} (ref >59)

## 2021-04-22 LAB — LIPID PANEL
Chol/HDL Ratio: 3.5 ratio (ref 0.0–4.4)
Cholesterol, Total: 173 mg/dL (ref 100–199)
HDL: 49 mg/dL (ref >39)
LDL Chol Calc (NIH): 91 mg/dL (ref 0–99)
Triglycerides: 191 mg/dL — ABNORMAL HIGH (ref 0–149)
VLDL Cholesterol Cal: 33 mg/dL (ref 5–40)

## 2021-04-22 LAB — BAYER DCA HB A1C WAIVED: HB A1C (BAYER DCA - WAIVED): 6.1 % (ref <7.0)

## 2021-04-22 LAB — TSH: TSH: 1.78 u[IU]/mL (ref 0.450–4.500)

## 2021-05-03 ENCOUNTER — Encounter: Payer: Self-pay | Admitting: Physical Therapy

## 2021-05-03 ENCOUNTER — Ambulatory Visit: Payer: Medicare Other | Attending: Family Medicine | Admitting: Physical Therapy

## 2021-05-03 ENCOUNTER — Other Ambulatory Visit: Payer: Self-pay

## 2021-05-03 DIAGNOSIS — M6281 Muscle weakness (generalized): Secondary | ICD-10-CM | POA: Insufficient documentation

## 2021-05-03 DIAGNOSIS — R262 Difficulty in walking, not elsewhere classified: Secondary | ICD-10-CM | POA: Diagnosis not present

## 2021-05-03 DIAGNOSIS — R293 Abnormal posture: Secondary | ICD-10-CM | POA: Diagnosis not present

## 2021-05-03 DIAGNOSIS — G8929 Other chronic pain: Secondary | ICD-10-CM | POA: Diagnosis not present

## 2021-05-03 DIAGNOSIS — M545 Low back pain, unspecified: Secondary | ICD-10-CM | POA: Diagnosis not present

## 2021-05-03 NOTE — Therapy (Signed)
[facility] [place], [place], [address] Phone: [phone]   Fax:  [phone]  Physical Therapy Evaluation  Patient Details  Name: Danielle Parrish MRN: [id number] Date of Birth: January 20, 1941 Referring Provider (PT): Caryl Pina MD   Encounter Date: 05/03/2021   PT End of Session - 05/03/21 1427    Visit Number 1    Number of Visits 12    Date for PT Re-Evaluation 08/01/21    Authorization Type PROGRESS NOTE AT 10TH VISIT.  KX MODIFIER AFTER 15 VISITS.    PT Start Time 0900    PT Stop Time 0935    PT Time Calculation (min) 35 min    Activity Tolerance Patient tolerated treatment well    Behavior During Therapy WFL for tasks assessed/performed           Past Medical History:  Diagnosis Date  . Anxiety   . Arthritis   . CHF (congestive heart failure) ([place])   . Chronic pain of multiple joints   . Depression   . Essential hypertension   . GERD (gastroesophageal reflux disease)   . Hyperlipidemia   . Hyperlipidemia associated with type 2 diabetes mellitus ([place])   . Hypertension associated with type 2 diabetes mellitus ([place])   . Hypothyroidism   . Osteoporosis   . Sleep apnea    CPAP  . Type 2 diabetes mellitus with hypercholesterolemia ([place])   . Vitamin D deficiency     Past Surgical History:  Procedure Laterality Date  . ABDOMINAL HERNIA REPAIR    . ABDOMINAL HYSTERECTOMY  1977  . BREAST EXCISIONAL BIOPSY Right   . EYE SURGERY  2020   bilateral cataract removal   . KNEE ARTHROSCOPY Left 09/30/2014   Procedure: LEFT KNEE ARTHROSCOPY MEDIAL MENISECTOMY ABRASION CHONDROPLASTY SYNOVECTOMY SUPRAPATELLER CUFF;  Surgeon: Tobi Bastos, MD;  Location: [facility];  Service: Orthopedics;  Laterality: Left;    There were no vitals filed for this visit.    Subjective Assessment - 05/03/21 1429    Subjective COVID-19 screen performed prior to patient entering clinic.  The patient presents to the clinic today  with continued c/o low back pain.  Today, she reports pain across her low back.  Her pain is rated at a 6/10 and higher if on her feet too long.  Pain patches and Tylenol decrease pain.    Pertinent History CHF, HTN, Hypothyroidism, OP, DM, left knee surgery.    How long can you stand comfortably? Varies.    How long can you walk comfortably? Varies.    Patient Stated Goals Reduce pain.    Currently in Pain? Yes    Pain Score 6     Pain Location Back    Pain Orientation Lower;Left    Pain Descriptors / Indicators Sharp    Pain Type Chronic pain    Pain Onset More than a month ago    Pain Frequency Constant    Aggravating Factors  See above.    Pain Relieving Factors See above.              [facility] PT Assessment - 05/03/21 0001      Assessment   Medical Diagnosis Chronic LBP without scitica.    Referring Provider (PT) Caryl Pina MD    Onset Date/Surgical Date --   Ongoing.     Precautions   Precaution Comments OP.      Restrictions   Weight Bearing Restrictions No      Balance Screen  Has the patient fallen in the past 6 months No    Has the patient had a decrease in activity level because of a fear of falling?  Yes    Is the patient reluctant to leave their home because of a fear of falling?  No      Home Environment   Living Environment Private residence      Prior Function   Level of Independence Independent      Posture/Postural Control   Posture/Postural Control Postural limitations    Postural Limitations Rounded Shoulders;Forward head    Posture Comments Bilateral knee flexion and genu varum.      Deep Tendon Reflexes   DTR Assessment Site Patella;Achilles    Patella DTR 1+    Achilles DTR 0      ROM / Strength   AROM / PROM / Strength AROM;Strength      AROM   Overall AROM Comments Deferred spinal range of motion testing due to OP.  Normal bilateral hip flexion assessed in supine.      Strength   Overall Strength Comments Patient able to  provide a solid 4/5 with regards to hip strength and 4+/5 for knees and ankle.      Palpation   Palpation comment Patient c/o pain across her lower lumbar region.      Special Tests   Other special tests Equal leg lengths.  (-) bilateral SLR testing. (-) FABER testing.      Ambulation/Gait   Gait Comments The patient walks in some spinal flexion with bilateral knee flexion and genu varum.                      Objective measurements completed on examination: See above findings.                    PT Long Term Goals - 05/03/21 1509      PT LONG TERM GOAL #1   Title Patient will be independent with HEP    Time 6    Period Weeks    Status New      PT LONG TERM GOAL #2   Title Walk a community distance with pain not > 3/10.    Time 6    Period Weeks    Status New                  Plan - 05/03/21 1500    Clinical Impression Statement The patient presents to OPPT with c/o chronic low back.  She c/o pain across her low back today.  Her functional mobility is impaired due to pain.  She reports increased pain the long she stands and walks.  She exhibited negative special test and good LE strengthening.  Patient will benefit from skilled physical therapy intervention to address pain and deficits.    Personal Factors and Comorbidities Comorbidity 1;Comorbidity 2    Comorbidities CHF, HTN, Hypothyroidism, OP, DM, left knee surgery.    Examination-Activity Limitations Other;Locomotion Level    Examination-Participation Restrictions Other    Stability/Clinical Decision Making Evolving/Moderate complexity    Clinical Decision Making Low    Rehab Potential Good    PT Frequency 2x / week    PT Duration 6 weeks    PT Treatment/Interventions ADLs/Self Care Home Management;Cryotherapy;Electrical Stimulation;Ultrasound;Moist Heat;Functional mobility training;Therapeutic activities;Therapeutic exercise;Manual techniques;Patient/family education;Passive range of  motion    PT Next Visit Plan Core exercise progression.  Modalities and STW/M as needed to low back.  Consulted and Agree with Plan of Care Patient           Patient will benefit from skilled therapeutic intervention in order to improve the following deficits and impairments:  Pain,Postural dysfunction,Decreased activity tolerance  Visit Diagnosis: Chronic bilateral low back pain without sciatica - Plan: PT plan of care cert/re-cert  Abnormal posture - Plan: PT plan of care cert/re-cert     Problem List Patient Active Problem List   Diagnosis Date Noted  . Age-related osteoporosis with current pathological fracture 03/13/2019  . Chronic midline low back pain without sciatica 01/30/2019  . Age-related osteoporosis with current pathol fracture of vertebra ([place]) 10/28/2018  . Calcification of aorta (HCC) 10/02/2018  . Compression fracture of T12 vertebra ([place]) 10/01/2018  . Primary osteoarthritis of right knee 07/20/2017  . Meniscus degeneration, right 06/14/2017  . Varicose veins of left lower extremity with complications 16/09/9603  . Pain medication agreement signed 06/06/2016  . Gout attack 01/11/2016  . OSA (obstructive sleep apnea) 09/14/2015  . Chronic diastolic heart failure ([place]) 09/10/2015  . Orthopnea 09/02/2015  . Dysphagia, pharyngoesophageal phase 08/04/2015  . Anorexia 08/04/2015  . Fatigue 08/04/2015  . Malaise and fatigue 08/04/2015  . Depression, recurrent ([place]) 04/08/2015  . Osteoarthritis of left knee 09/30/2014  . Vitamin D deficiency   . Anxiety   . GERD (gastroesophageal reflux disease)   . Arthritis   . Chronic pain of multiple joints   . Hypertension associated with type 2 diabetes mellitus ([place]) 05/15/2013  . Hyperlipidemia associated with type 2 diabetes mellitus ([place]) 05/15/2013  . Type 2 diabetes mellitus with hypercholesterolemia ([place]) 05/15/2013  . Morbid obesity ([place]) 05/15/2013  . Hypothyroidism 05/15/2013    Gadiel John, [place]  MPT 05/03/2021, 3:11 PM  [facility] [address] [place], [place], [address] Phone: [phone]   Fax:  [phone]  Name: Danielle Parrish MRN: [id number] Date of Birth: 04-16-1941

## 2021-05-04 ENCOUNTER — Telehealth: Payer: Self-pay

## 2021-05-04 NOTE — Telephone Encounter (Signed)
Pts med request for prolia was sent to [facility] - waiting to receive it. Pt got a call from them that [facility] should receive it today.  Aware we have not yet got it and that she is due on 6/27.

## 2021-05-04 NOTE — Telephone Encounter (Signed)
PROLIA came in - in fridge with pt name on it - scheduled her to get injection in June.

## 2021-05-10 ENCOUNTER — Telehealth: Payer: Self-pay

## 2021-05-10 ENCOUNTER — Other Ambulatory Visit: Payer: Self-pay

## 2021-05-10 ENCOUNTER — Ambulatory Visit: Payer: Medicare Other | Admitting: Physical Therapy

## 2021-05-10 DIAGNOSIS — R293 Abnormal posture: Secondary | ICD-10-CM | POA: Diagnosis not present

## 2021-05-10 DIAGNOSIS — M545 Low back pain, unspecified: Secondary | ICD-10-CM

## 2021-05-10 DIAGNOSIS — G8929 Other chronic pain: Secondary | ICD-10-CM

## 2021-05-10 DIAGNOSIS — R262 Difficulty in walking, not elsewhere classified: Secondary | ICD-10-CM | POA: Diagnosis not present

## 2021-05-10 DIAGNOSIS — M6281 Muscle weakness (generalized): Secondary | ICD-10-CM | POA: Diagnosis not present

## 2021-05-10 MED ORDER — NYSTATIN 100000 UNIT/GM EX CREA: 1.0000 "application " | TOPICAL_CREAM | Freq: Two times a day (BID) | CUTANEOUS | 99 refills | Status: DC

## 2021-05-10 NOTE — Therapy (Signed)
[facility] [place], [place], [address] Phone: [phone]   Fax:  [phone]  Physical Therapy Treatment  Patient Details  Name: Danielle Parrish MRN: [id number] Date of Birth: 22-Feb-1941 Referring Provider (PT): Caryl Pina MD   Encounter Date: 05/10/2021   PT End of Session - 05/10/21 0830    Visit Number 2    Number of Visits 12    Date for PT Re-Evaluation 08/01/21    Authorization Type PROGRESS NOTE AT 10TH VISIT.  KX MODIFIER AFTER 15 VISITS.    PT Start Time 0822    PT Stop Time 0858    PT Time Calculation (min) 36 min    Activity Tolerance Patient tolerated treatment well    Behavior During Therapy [facility] for tasks assessed/performed           Past Medical History:  Diagnosis Date  . Anxiety   . Arthritis   . CHF (congestive heart failure) ([place])   . Chronic pain of multiple joints   . Depression   . Essential hypertension   . GERD (gastroesophageal reflux disease)   . Hyperlipidemia   . Hyperlipidemia associated with type 2 diabetes mellitus ([place])   . Hypertension associated with type 2 diabetes mellitus ([place])   . Hypothyroidism   . Osteoporosis   . Sleep apnea    CPAP  . Type 2 diabetes mellitus with hypercholesterolemia ([place])   . Vitamin D deficiency     Past Surgical History:  Procedure Laterality Date  . ABDOMINAL HERNIA REPAIR    . ABDOMINAL HYSTERECTOMY  1977  . BREAST EXCISIONAL BIOPSY Right   . EYE SURGERY  2020   bilateral cataract removal   . KNEE ARTHROSCOPY Left 09/30/2014   Procedure: LEFT KNEE ARTHROSCOPY MEDIAL MENISECTOMY ABRASION CHONDROPLASTY SYNOVECTOMY SUPRAPATELLER CUFF;  Surgeon: Tobi Bastos, MD;  Location: [facility];  Service: Orthopedics;  Laterality: Left;    There were no vitals filed for this visit.   Subjective Assessment - 05/10/21 0825    Subjective COVID-19 screen performed prior to patient entering clinic.  Patient reported she had a fall friday at  home and fel backward hit head no noticed bruising yet sore all over and daughter was at house, discussed going to MD for check up.    Pertinent History CHF, HTN, Hypothyroidism, OP, DM, left knee surgery.    How long can you stand comfortably? Varies.    How long can you walk comfortably? Varies.    Patient Stated Goals Reduce pain.    Currently in Pain? Yes    Pain Score --   no number given   Pain Location Back    Pain Orientation Right;Left;Mid    Pain Descriptors / Indicators Dull;Discomfort    Pain Type Chronic pain    Aggravating Factors  laying down    Pain Relieving Factors sitting                             [facility] Adult PT Treatment/Exercise - 05/10/21 0001      Self-Care   Self-Care ADL's;Lifting;Posture;Heat/Ice Application;Other Self-Care Comments    Other Self-Care Comments  HEP provided      Exercises   Exercises Lumbar;Shoulder      Lumbar Exercises: Seated   Other Seated Lumbar Exercises seated glut sets 5sec x20, abdominal bracing 5sec x20, heel lifts/toe lifts x20 each with core activation, marching 2x10 with core activation  Shoulder Exercises: Seated   Retraction Strengthening;Both;20 reps   3sec hold   Other Seated Exercises seatd "V" bil UE 2x10      Modalities   Modalities Electrical Stimulation;Moist Heat      Moist Heat Therapy   Number Minutes Moist Heat 10 Minutes    Moist Heat Location Lumbar Spine      Electrical Stimulation   Electrical Stimulation Location mid back    Electrical Stimulation Action premod    Electrical Stimulation Parameters 80-150hz  x53min    Electrical Stimulation Goals Pain                       PT Long Term Goals - 05/10/21 0909      PT LONG TERM GOAL #1   Title Patient will be independent with HEP    Baseline initial issued 05/10/21    Time 6    Period Weeks    Status On-going      PT LONG TERM GOAL #2   Title Walk a community distance with pain not > 3/10.    Time 6     Period Weeks    Status On-going                 Plan - 05/10/21 0844    Clinical Impression Statement Patient tolerated treatment fair due to a fall on friday and sore all over. Today focused on posture awareness technique followed by posture and abdominal bracing to stabilize back muscles. HEP provided today. Patient responded well to modalities today. goals progressing.    Personal Factors and Comorbidities Comorbidity 1;Comorbidity 2    Comorbidities CHF, HTN, Hypothyroidism, OP, DM, left knee surgery.    Examination-Activity Limitations Other;Locomotion Level    Examination-Participation Restrictions Other    Stability/Clinical Decision Making Evolving/Moderate complexity    Rehab Potential Good    PT Frequency 2x / week    PT Duration 6 weeks    PT Treatment/Interventions ADLs/Self Care Home Management;Cryotherapy;Electrical Stimulation;Ultrasound;Moist Heat;Functional mobility training;Therapeutic activities;Therapeutic exercise;Manual techniques;Patient/family education;Passive range of motion    PT Next Visit Plan Core exercise progression.  Modalities and STW/M as needed to low back.    Consulted and Agree with Plan of Care Patient           Patient will benefit from skilled therapeutic intervention in order to improve the following deficits and impairments:  Pain,Postural dysfunction,Decreased activity tolerance  Visit Diagnosis: Chronic bilateral low back pain without sciatica  Abnormal posture     Problem List Patient Active Problem List   Diagnosis Date Noted  . Age-related osteoporosis with current pathological fracture 03/13/2019  . Chronic midline low back pain without sciatica 01/30/2019  . Age-related osteoporosis with current pathol fracture of vertebra ([place]) 10/28/2018  . Calcification of aorta (HCC) 10/02/2018  . Compression fracture of T12 vertebra ([place]) 10/01/2018  . Primary osteoarthritis of right knee 07/20/2017  . Meniscus degeneration, right  06/14/2017  . Varicose veins of left lower extremity with complications 54/08/8118  . Pain medication agreement signed 06/06/2016  . Gout attack 01/11/2016  . OSA (obstructive sleep apnea) 09/14/2015  . Chronic diastolic heart failure ([place]) 09/10/2015  . Orthopnea 09/02/2015  . Dysphagia, pharyngoesophageal phase 08/04/2015  . Anorexia 08/04/2015  . Fatigue 08/04/2015  . Malaise and fatigue 08/04/2015  . Depression, recurrent ([place]) 04/08/2015  . Osteoarthritis of left knee 09/30/2014  . Vitamin D deficiency   . Anxiety   . GERD (gastroesophageal reflux disease)   . Arthritis   .  Chronic pain of multiple joints   . Hypertension associated with type 2 diabetes mellitus ([place]) 05/15/2013  . Hyperlipidemia associated with type 2 diabetes mellitus ([place]) 05/15/2013  . Type 2 diabetes mellitus with hypercholesterolemia ([place]) 05/15/2013  . Morbid obesity ([place]) 05/15/2013  . Hypothyroidism 05/15/2013    Phillips Climes, PTA 05/10/2021, 9:11 AM  [facility] [place], [place], [address] Phone: [phone]   Fax:  [phone]  Name: Danielle Parrish MRN: [id number] Date of Birth: 1941-03-03

## 2021-05-10 NOTE — Telephone Encounter (Signed)
  Prescription Request  05/10/2021  What is the name of the medication or equipment? c-pap supplies  Have you contacted your pharmacy to request a refill? (if applicable) no  Which pharmacy would you like this sent to? [facility]   Patient notified that their request is being sent to the clinical staff for review and that they should receive a response within 2 business days.

## 2021-05-10 NOTE — Telephone Encounter (Signed)
  Prescription Request  05/10/2021  What is the name of the medication or equipment? nystatin cream (MYCOSTATIN)  Have you contacted your pharmacy to request a refill?  Which pharmacy would you [facility]   Patient notified that their request is being sent to the clinical staff for review and that they should receive a response within 2 business days.

## 2021-05-10 NOTE — Patient Instructions (Signed)
Pelvic Tilt: Posterior - Legs Bent (Supine)  Tighten stomach and buttock and flatten back by rolling pelvis down. Hold _10___ seconds. Relax. Repeat _10-30___ times per set. Do __2__ sets per session. Do _2___ sessions per day.    Heel Raise (Sitting)    Raise heels, keeping toes on floor.  Tighten abdomin and buttock (draw in) Repeat __10__ times per set. Do __1-2__ sets per session. Do __1-2__ sessions per day.   FLEXION: Sitting (Active)    Sit, both feet flat. Lift right knee toward ceiling. Tighten abdomin and buttock (draw in) Complete _10__ sets of _1-2__ repetitions. Perform _1-2__ sessions per day.     Brushing Teeth    Place one foot on ledge and one hand on counter. Bend other knee slightly to keep back straight.  Copyright  VHI. All rights reserved.  Refrigerator   Squat with knees apart to reach lower shelves and drawers.   Copyright  VHI. All rights reserved.  Laundry [employer] down and hold basket close to stand. Use leg muscles to do the work.   Copyright  VHI. All rights reserved.  Housework - Vacuuming   Hold the vacuum with arm held at side. Step back and forth to move it, keeping head up. Avoid twisting.   Copyright  VHI. All rights reserved.  Housework - Wiping   Position yourself as close as possible to reach work surface. Avoid straining your back.   Copyright  VHI. All rights reserved.  Gardening - Mowing   Keep arms close to sides and walk with lawn mower.   Copyright  VHI. All rights reserved.  Sleeping on Side   Place pillow between knees. Use cervical support under neck and a roll around waist as needed.   Copyright  VHI. All rights reserved.  Log Roll   Lying on back, bend left knee and place left arm across chest. Roll all in one movement to the right. Reverse to roll to the left. Always move as one unit.   Copyright  VHI. All rights reserved.  Stand to Sit / Sit to Stand   To sit: Bend  knees to lower self onto front edge of chair, then scoot back on seat. To stand: Reverse sequence by placing one foot forward, and scoot to front of seat. Use rocking motion to stand up.  Copyright  VHI. All rights reserved.  Posture - Standing   Good posture is important. Avoid slouching and forward head thrust. Maintain curve in low back and align ears over shoul- ders, hips over ankles.   Copyright  VHI. All rights reserved.  Posture - Sitting   Sit upright, head facing forward. Try using a roll to support lower back. Keep shoulders relaxed, and avoid rounded back. Keep hips level with knees. Avoid crossing legs for long periods.   Copyright  VHI. All rights reserved.  Computer Work   Position work to Programmer, multimedia. Use proper work and seat height. Keep shoulders back and down, wrists straight, and elbows at right angles. Use chair that provides full back support. Add footrest and lumbar roll as needed.   Copyright  VHI. All rights reserved.

## 2021-05-11 NOTE — Telephone Encounter (Signed)
Pt aware refill sent to pharmacy 

## 2021-05-12 ENCOUNTER — Other Ambulatory Visit: Payer: Self-pay | Admitting: Family Medicine

## 2021-05-12 DIAGNOSIS — R062 Wheezing: Secondary | ICD-10-CM

## 2021-05-15 ENCOUNTER — Encounter (HOSPITAL_COMMUNITY): Payer: Self-pay | Admitting: Emergency Medicine

## 2021-05-15 ENCOUNTER — Other Ambulatory Visit: Payer: Self-pay

## 2021-05-15 ENCOUNTER — Emergency Department (HOSPITAL_COMMUNITY)
Admission: EM | Admit: 2021-05-15 | Discharge: 2021-05-15 | Disposition: A | Discharge status: Home / Self Care | Payer: Medicare Other | Attending: Emergency Medicine | Admitting: Emergency Medicine

## 2021-05-15 ENCOUNTER — Emergency Department (HOSPITAL_COMMUNITY): Payer: Medicare Other

## 2021-05-15 DIAGNOSIS — Z79899 Other long term (current) drug therapy: Secondary | ICD-10-CM | POA: Diagnosis not present

## 2021-05-15 DIAGNOSIS — R0789 Other chest pain: Secondary | ICD-10-CM | POA: Diagnosis present

## 2021-05-15 DIAGNOSIS — I509 Heart failure, unspecified: Secondary | ICD-10-CM | POA: Diagnosis not present

## 2021-05-15 DIAGNOSIS — M940 Chondrocostal junction syndrome [Tietze]: Secondary | ICD-10-CM | POA: Diagnosis not present

## 2021-05-15 DIAGNOSIS — E039 Hypothyroidism, unspecified: Secondary | ICD-10-CM | POA: Insufficient documentation

## 2021-05-15 DIAGNOSIS — E119 Type 2 diabetes mellitus without complications: Secondary | ICD-10-CM | POA: Diagnosis not present

## 2021-05-15 DIAGNOSIS — I110 Hypertensive heart disease with heart failure: Secondary | ICD-10-CM | POA: Insufficient documentation

## 2021-05-15 DIAGNOSIS — Z7982 Long term (current) use of aspirin: Secondary | ICD-10-CM | POA: Diagnosis not present

## 2021-05-15 DIAGNOSIS — I517 Cardiomegaly: Secondary | ICD-10-CM | POA: Diagnosis not present

## 2021-05-15 DIAGNOSIS — R0781 Pleurodynia: Secondary | ICD-10-CM | POA: Diagnosis not present

## 2021-05-15 MED ORDER — TRAMADOL HCL 50 MG PO TABS: 50.0000 mg | ORAL_TABLET | Freq: Four times a day (QID) | ORAL | 0 refills | Status: DC | PRN

## 2021-05-15 NOTE — Discharge Instructions (Signed)
Your chest x-ray is negative for any rib fractures or other bony injuries.  I suspect you do have a inflammation of your rib cage and cartilage from your injury.  I recommend continuing to use heating pads intermittently, also recommend taking arthritis strength Tylenol 500 mg every 6 hours for symptom relief.  You may add the tramadol prescribed if needed for extra pain relief, however this medication may make you drowsy so use caution while taking it.  Follow-up with your primary doctor if your symptoms persist or do not improve with this treatment plan.

## 2021-05-15 NOTE — ED Triage Notes (Signed)
Pt to the ED with a fall on 05/06/21. Pt is complaining of rib and back pain.  Pt states she felll backwards landing in the grass. She states she did hit her head but does not take blood thinners.

## 2021-05-15 NOTE — ED Provider Notes (Signed)
[facility] Provider Note   CSN: [id number] Arrival date & time: 05/15/21  1248     History Chief Complaint  Patient presents with  . Fall    Danielle Parrish is a 80 y.o. female with a history including anxiety, arthritis, CHF, hypertension, GERD, hyperlipidemia and osteoporosis, also type 2 diabetes presenting for evaluation of persistent anterior bilateral lower rib cage pain which started from a fall that happened  at her home on May 13.  She reports she tripped and fell backwards landing onto grass when the injury occurred.  She had immediate pain across her middle back with radiation into her bilateral lower anterior ribs.  Her symptoms improved but now have worsened again.  She denies any new injury and denies any other injuries from the fall.  She did hit her head on the grass when she initially fell, but had no LOC and no nausea or vomiting, dizziness, gait or other issues since the fall.  She is not on any blood thinning medications.  She takes Tylenol routinely for her chronic arthritis pain.  She has applied heat to her chest wall with no significant improvement in her symptoms she has not contacted her primary doctor regarding this complaint.  She denies shortness of breath, cough, nausea or vomiting, dizziness.    The history is provided by the patient.       Past Medical History:  Diagnosis Date  . Anxiety   . Arthritis   . CHF (congestive heart failure) ([place])   . Chronic pain of multiple joints   . Depression   . Essential hypertension   . GERD (gastroesophageal reflux disease)   . Hyperlipidemia   . Hyperlipidemia associated with type 2 diabetes mellitus ([place])   . Hypertension associated with type 2 diabetes mellitus ([place])   . Hypothyroidism   . Osteoporosis   . Sleep apnea    CPAP  . Type 2 diabetes mellitus with hypercholesterolemia ([place])   . Vitamin D deficiency     Patient Active Problem List   Diagnosis Date Noted  .  Age-related osteoporosis with current pathological fracture 03/13/2019  . Chronic midline low back pain without sciatica 01/30/2019  . Age-related osteoporosis with current pathol fracture of vertebra ([place]) 10/28/2018  . Calcification of aorta (HCC) 10/02/2018  . Compression fracture of T12 vertebra ([place]) 10/01/2018  . Primary osteoarthritis of right knee 07/20/2017  . Meniscus degeneration, right 06/14/2017  . Varicose veins of left lower extremity with complications [id number]  . Pain medication agreement signed 06/06/2016  . Gout attack 01/11/2016  . OSA (obstructive sleep apnea) 09/14/2015  . Chronic diastolic heart failure ([place]) 09/10/2015  . Orthopnea 09/02/2015  . Dysphagia, pharyngoesophageal phase 08/04/2015  . Anorexia 08/04/2015  . Fatigue 08/04/2015  . Malaise and fatigue 08/04/2015  . Depression, recurrent ([place]) 04/08/2015  . Osteoarthritis of left knee 09/30/2014  . Vitamin D deficiency   . Anxiety   . GERD (gastroesophageal reflux disease)   . Arthritis   . Chronic pain of multiple joints   . Hypertension associated with type 2 diabetes mellitus ([place]) 05/15/2013  . Hyperlipidemia associated with type 2 diabetes mellitus ([place]) 05/15/2013  . Type 2 diabetes mellitus with hypercholesterolemia ([place]) 05/15/2013  . Morbid obesity ([place]) 05/15/2013  . Hypothyroidism 05/15/2013    Past Surgical History:  Procedure Laterality Date  . ABDOMINAL HERNIA REPAIR    . ABDOMINAL HYSTERECTOMY  1977  . BREAST EXCISIONAL BIOPSY Right   . EYE SURGERY  2020  bilateral cataract removal   . KNEE ARTHROSCOPY Left 09/30/2014   Procedure: LEFT KNEE ARTHROSCOPY MEDIAL MENISECTOMY ABRASION CHONDROPLASTY SYNOVECTOMY SUPRAPATELLER CUFF;  Surgeon: Tobi Bastos, MD;  Location: [facility];  Service: Orthopedics;  Laterality: Left;     OB History   No obstetric history on file.     Family History  Problem Relation Age of Onset  . Hypertension Mother   . Stroke Mother   . Cancer Father      Social History   Tobacco Use  . Smoking status: Never Smoker  . Smokeless tobacco: Never Used  Vaping Use  . Vaping Use: Never used  Substance Use Topics  . Alcohol use: No  . Drug use: No    Home Medications Prior to Admission medications   Medication Sig Start Date End Date Taking? Authorizing Provider  traMADol (ULTRAM) 50 MG tablet Take 1 tablet (50 mg total) by mouth every 6 (six) hours as needed. 05/15/21  Yes Elchonon Maxson, Almyra Free, PA-C  acetaminophen (TYLENOL) 500 MG tablet Take 1 tablet (500 mg total) by mouth every 6 (six) hours as needed. 05/14/20   Ivy Lynn, NP  albuterol (VENTOLIN HFA) 108 (90 Base) MCG/ACT inhaler TAKE 2 PUFFS BY MOUTH EVERY 6 HOURS AS NEEDED FOR WHEEZE OR SHORTNESS OF BREATH 05/12/21   Dettinger, Fransisca Kaufmann, MD  amLODipine (NORVASC) 10 MG tablet Take 1 tablet (10 mg total) by mouth daily. 04/21/21   Dettinger, Fransisca Kaufmann, MD  amoxicillin (AMOXIL) 500 MG capsule Take 1 capsule (500 mg total) by mouth 2 (two) times daily. 09/07/20   Dettinger, Fransisca Kaufmann, MD  aspirin 81 MG chewable tablet Chew 4 tablets by mouth daily.     [provider]  calcium-vitamin D (OSCAL WITH D) 500-200 MG-UNIT TABS tablet TAKE 1 TABLET BY MOUTH EVERY DAY WITH BREAKFAST 09/06/20   Dettinger, Fransisca Kaufmann, MD  Cholecalciferol (VITAMIN D-3 PO) Take 1 capsule by mouth daily.     [provider]  cyclobenzaprine (FLEXERIL) 5 MG tablet Take 1 tablet (5 mg total) by mouth 3 (three) times daily as needed for muscle spasms. 05/14/20   Ivy Lynn, NP  denosumab (PROLIA) 60 MG/ML SOSY injection Bring to your doctor to inject 60 mg subcutaneously. 04/21/21   Dettinger, Fransisca Kaufmann, MD  diclofenac Sodium (VOLTAREN) 1 % GEL APPLY 4 GRAMS TOPICALLY 4 (FOUR) TIMES DAILY. 04/04/21   Dettinger, Fransisca Kaufmann, MD  diphenhydrAMINE (BENADRYL) 25 MG tablet Take 50 mg by mouth at bedtime as needed for allergies.     [provider]  gabapentin (NEURONTIN) 100 MG capsule Take 2 capsules (200 mg  total) by mouth 3 (three) times daily. 03/15/20 07/28/20  Baruch Gouty, FNP  ketoconazole (NIZORAL) 2 % cream Apply to affected area everyday 04/21/21   Dettinger, Fransisca Kaufmann, MD  levothyroxine (SYNTHROID) 100 MCG tablet Take 1 tablet (100 mcg total) by mouth daily before breakfast. 04/21/21   Dettinger, Fransisca Kaufmann, MD  lidocaine (LIDODERM) 5 % Place 1 patch onto the skin daily. Remove & Discard patch within 12 hours or as directed by MD 02/26/20   Baruch Gouty, FNP  lisinopril (ZESTRIL) 20 MG tablet Take 1 tablet (20 mg total) by mouth daily. 04/21/21   Dettinger, Fransisca Kaufmann, MD  lovastatin (MEVACOR) 40 MG tablet TAKE 1 TABLET BY MOUTH EVERYDAY AT BEDTIME 04/21/21   Dettinger, Fransisca Kaufmann, MD  mirabegron ER (MYRBETRIQ) 25 MG TB24 tablet Take 1 tablet (25 mg total) by mouth daily. 06/07/20  Dettinger, Fransisca Kaufmann, MD  Multiple Vitamin (MULTI-VITAMIN) tablet Take by mouth.    [provider]  Multiple Vitamins-Minerals (PRESERVISION AREDS 2 PO) Take by mouth.    [provider]  nystatin (MYCOSTATIN/NYSTOP) powder Apply topically 4 (four) times daily. 04/10/18   Timmothy Euler, MD  nystatin cream (MYCOSTATIN) Apply 1 application topically 2 (two) times daily. 05/10/21   Dettinger, Fransisca Kaufmann, MD  omeprazole (PRILOSEC) 20 MG capsule Take 1 capsule (20 mg total) by mouth 2 (two) times daily before a meal. 04/21/21   Dettinger, Fransisca Kaufmann, MD  PROLIA 60 MG/ML SOSY injection TO BE ADMINISTERED IN PHYSICIAN'S OFFICE. INJECT ONE SYRINGE SUBCOUSLY ONCE EVERY 6 MONTHS. REFRIGERATE. USE WITHIN 14 DAYS ONCE AT ROOM TEMPERATURE. 04/29/21   Dettinger, Fransisca Kaufmann, MD  pyridOXINE (VITAMIN B-6) 100 MG tablet Take 100 mg by mouth daily.    [provider]  sertraline (ZOLOFT) 100 MG tablet Take 2 tablets (200 mg total) by mouth daily. 04/21/21   Dettinger, Fransisca Kaufmann, MD  sodium chloride (OCEAN) 0.65 % SOLN nasal spray Place 1 spray into both nostrils as needed for congestion. 05/24/17   Timmothy Euler, MD     Allergies    Crestor [rosuvastatin], Nsaids, Vytorin [ezetimibe-simvastatin], and Tolmetin  Review of Systems   Review of Systems  Constitutional: Negative for fever.  HENT: Negative for congestion and sore throat.   Eyes: Negative.   Respiratory: Negative for chest tightness and shortness of breath.   Cardiovascular: Positive for chest pain. Negative for palpitations.  Gastrointestinal: Negative for abdominal pain and nausea.  Genitourinary: Negative.   Musculoskeletal: Negative for arthralgias, joint swelling and neck pain.  Skin: Negative.  Negative for rash and wound.  Neurological: Negative for dizziness, weakness, light-headedness, numbness and headaches.  Psychiatric/Behavioral: Negative.   All other systems reviewed and are negative.   Physical Exam Updated Vital Signs BP (!) 158/80 (BP Location: Right Arm)   Pulse 78   Temp 98.3 F (36.8 C) (Oral)   Resp 18   Ht 5\' 3"  (1.6 m)   Wt 81.6 kg   SpO2 98%   BMI 31.89 kg/m   Physical Exam Vitals and nursing note reviewed.  Constitutional:      Appearance: Normal appearance. She is well-developed.  HENT:     Head: Normocephalic and atraumatic.  Eyes:     Conjunctiva/sclera: Conjunctivae normal.  Cardiovascular:     Rate and Rhythm: Normal rate and regular rhythm.     Heart sounds: Normal heart sounds.  Pulmonary:     Effort: Pulmonary effort is normal.     Breath sounds: Normal breath sounds. No wheezing.  Chest:     Comments: No midline thoracic, cervical or lumbar pain.  She does have some tenderness along her bilateral posterior lower ribs without bruising or palpable deformity.  She is also expressing soreness with palpation along her anterior lower ribs as well.  No sternum localizing pain.  No palpable deformity, crepitus or edema noted. Abdominal:     General: Abdomen is protuberant. Bowel sounds are normal.     Palpations: Abdomen is soft.     Tenderness: There is no abdominal tenderness. There is no  guarding or rebound.     Comments: No abdominal pain, no guard or rebound.  Musculoskeletal:        General: Normal range of motion.     Cervical back: Normal range of motion.  Skin:    General: Skin is warm and dry.  Neurological:  Mental Status: She is alert.     ED Results / Procedures / Treatments   Labs (all labs ordered are listed, but only abnormal results are displayed) Labs Reviewed - No data to display  EKG None  Radiology DG Chest 2 View  Result Date: 05/15/2021 CLINICAL DATA:  Fall, rib pain.  CHF EXAM: CHEST - 2 VIEW COMPARISON:  09/11/2018 FINDINGS: Stable mild cardiomegaly. No focal airspace consolidation, pleural effusion, or pneumothorax. Exaggerated thoracic kyphosis. No acute osseous findings. IMPRESSION: 1. No active cardiopulmonary disease. 2. Mild cardiomegaly. Electronically Signed   By: Davina Poke D.O.   On: 05/15/2021 13:52    Procedures Procedures   Medications Ordered in ED Medications - No data to display  ED Course  I have reviewed the triage vital signs and the nursing notes.  Pertinent labs & imaging results that were available during my care of the patient were reviewed by me and considered in my medical decision making (see chart for details).    MDM Rules/Calculators/A&P                          History and exam consistent with musculoskeletal: Source of chest pain symptoms her pain is reproducible, generalized pain across to her lower ribs which is worsened near her anterior midline.  Her sternum is none tender.  I suspect that this is a costochondritis.  She was encouraged to continue taking her Tylenol, added tramadol for additional pain relief.  Continue heat.  Caution regarding drowsiness with tramadol discussed.  Plan follow-up with her PCP for recheck in 10 to 14 days if her symptoms are not improving.  Her x-ray was reviewed and there are no signs of any bony chest wall injury. Final Clinical Impression(s) / ED  Diagnoses Final diagnoses:  Costochondritis, acute    Rx / DC Orders ED Discharge Orders         Ordered    traMADol (ULTRAM) 50 MG tablet  Every 6 hours PRN        05/15/21 1423           Evalee Jefferson, Hershal Coria 05/15/21 1509    Long, Wonda [place], MD 05/19/21 [phone]

## 2021-05-17 ENCOUNTER — Other Ambulatory Visit: Payer: Self-pay

## 2021-05-17 ENCOUNTER — Ambulatory Visit: Payer: Medicare Other | Admitting: *Deleted

## 2021-05-17 DIAGNOSIS — G8929 Other chronic pain: Secondary | ICD-10-CM | POA: Diagnosis not present

## 2021-05-17 DIAGNOSIS — R293 Abnormal posture: Secondary | ICD-10-CM | POA: Diagnosis not present

## 2021-05-17 DIAGNOSIS — M545 Low back pain, unspecified: Secondary | ICD-10-CM

## 2021-05-17 DIAGNOSIS — M170 Bilateral primary osteoarthritis of knee: Secondary | ICD-10-CM | POA: Diagnosis not present

## 2021-05-17 DIAGNOSIS — R262 Difficulty in walking, not elsewhere classified: Secondary | ICD-10-CM

## 2021-05-17 DIAGNOSIS — M6281 Muscle weakness (generalized): Secondary | ICD-10-CM | POA: Diagnosis not present

## 2021-05-17 NOTE — Therapy (Signed)
[facility] [place], [place], [address] Phone: [phone]   Fax:  [phone]  Physical Therapy Treatment  Patient Details  Name: Danielle Parrish MRN: [id number] Date of Birth: 07/11/1941 Referring Provider (PT): Caryl Pina MD   Encounter Date: 05/17/2021   PT End of Session - 05/17/21 0949    Visit Number 3    Number of Visits 12    Date for PT Re-Evaluation 08/01/21    Authorization Type PROGRESS NOTE AT 10TH VISIT.  KX MODIFIER AFTER 15 VISITS.    PT Start Time 0900    PT Stop Time 0949    PT Time Calculation (min) 49 min           Past Medical History:  Diagnosis Date  . Anxiety   . Arthritis   . CHF (congestive heart failure) ([place])   . Chronic pain of multiple joints   . Depression   . Essential hypertension   . GERD (gastroesophageal reflux disease)   . Hyperlipidemia   . Hyperlipidemia associated with type 2 diabetes mellitus ([place])   . Hypertension associated with type 2 diabetes mellitus ([place])   . Hypothyroidism   . Osteoporosis   . Sleep apnea    CPAP  . Type 2 diabetes mellitus with hypercholesterolemia ([place])   . Vitamin D deficiency     Past Surgical History:  Procedure Laterality Date  . ABDOMINAL HERNIA REPAIR    . ABDOMINAL HYSTERECTOMY  1977  . BREAST EXCISIONAL BIOPSY Right   . EYE SURGERY  2020   bilateral cataract removal   . KNEE ARTHROSCOPY Left 09/30/2014   Procedure: LEFT KNEE ARTHROSCOPY MEDIAL MENISECTOMY ABRASION CHONDROPLASTY SYNOVECTOMY SUPRAPATELLER CUFF;  Surgeon: Tobi Bastos, MD;  Location: [facility];  Service: Orthopedics;  Laterality: Left;    There were no vitals filed for this visit.   Subjective Assessment - 05/17/21 0903    Subjective COVID-19 screen performed prior to patient entering clinic.   Went to ED Sunday for pain.Costochondritis after Xray. pai 8/10    Pertinent History CHF, HTN, Hypothyroidism, OP, DM, left knee surgery.    How long can you  stand comfortably? Varies.    How long can you walk comfortably? Varies.    Patient Stated Goals Reduce pain.                             [facility] Adult PT Treatment/Exercise - 05/17/21 0001      Self-Care   Other Self-Care Comments  Reviewed Forensic psychologist      Exercises   Exercises Lumbar;Shoulder      Lumbar Exercises: Aerobic   Nustep L1 x10 mins with focus on posture      Modalities   Modalities Electrical Stimulation;Moist Heat      Moist Heat Therapy   Number Minutes Moist Heat 15 Minutes    Moist Heat Location Lumbar Spine      Electrical Stimulation   Electrical Stimulation Location mid back    Electrical Stimulation Action Seated premod    Electrical Stimulation Parameters 80-150hz  x 15 mins    Electrical Stimulation Goals Pain      Manual Therapy   Manual Therapy Soft tissue mobilization    Soft tissue mobilization STW x 13 mins to thoracolumbar paras bil with Pt in seated position  PT Long Term Goals - 05/10/21 0909      PT LONG TERM GOAL #1   Title Patient will be independent with HEP    Baseline initial issued 05/10/21    Time 6    Period Weeks    Status On-going      PT LONG TERM GOAL #2   Title Walk a community distance with pain not > 3/10.    Time 6    Period Weeks    Status On-going                 Plan - 05/17/21 0949    Personal Factors and Comorbidities Comorbidity 1;Comorbidity 2    Comorbidities CHF, HTN, Hypothyroidism, OP, DM, left knee surgery.    Examination-Activity Limitations Other;Locomotion Level    Examination-Participation Restrictions Other    Stability/Clinical Decision Making Evolving/Moderate complexity    Rehab Potential Good    PT Frequency 2x / week    PT Duration 6 weeks    PT Treatment/Interventions ADLs/Self Care Home Management;Cryotherapy;Electrical Stimulation;Ultrasound;Moist Heat;Functional mobility training;Therapeutic activities;Therapeutic  exercise;Manual techniques;Patient/family education;Passive range of motion    PT Next Visit Plan Core exercise progression.  Modalities and STW/M as needed to low back.           Patient will benefit from skilled therapeutic intervention in order to improve the following deficits and impairments:  Pain,Postural dysfunction,Decreased activity tolerance  Visit Diagnosis: Chronic bilateral low back pain without sciatica  Abnormal posture  Muscle weakness (generalized)  Difficulty in walking, not elsewhere classified  Chronic right-sided low back pain, unspecified whether sciatica present     Problem List Patient Active Problem List   Diagnosis Date Noted  . Age-related osteoporosis with current pathological fracture 03/13/2019  . Chronic midline low back pain without sciatica 01/30/2019  . Age-related osteoporosis with current pathol fracture of vertebra ([place]) 10/28/2018  . Calcification of aorta (HCC) 10/02/2018  . Compression fracture of T12 vertebra ([place]) 10/01/2018  . Primary osteoarthritis of right knee 07/20/2017  . Meniscus degeneration, right 06/14/2017  . Varicose veins of left lower extremity with complications 23/53/6144  . Pain medication agreement signed 06/06/2016  . Gout attack 01/11/2016  . OSA (obstructive sleep apnea) 09/14/2015  . Chronic diastolic heart failure ([place]) 09/10/2015  . Orthopnea 09/02/2015  . Dysphagia, pharyngoesophageal phase 08/04/2015  . Anorexia 08/04/2015  . Fatigue 08/04/2015  . Malaise and fatigue 08/04/2015  . Depression, recurrent ([place]) 04/08/2015  . Osteoarthritis of left knee 09/30/2014  . Vitamin D deficiency   . Anxiety   . GERD (gastroesophageal reflux disease)   . Arthritis   . Chronic pain of multiple joints   . Hypertension associated with type 2 diabetes mellitus ([place]) 05/15/2013  . Hyperlipidemia associated with type 2 diabetes mellitus ([place]) 05/15/2013  . Type 2 diabetes mellitus with hypercholesterolemia ([place])  05/15/2013  . Morbid obesity ([place]) 05/15/2013  . Hypothyroidism 05/15/2013    Jenika Chiem,CHRIS, PTA 05/17/2021, 10:08 AM  [facility] [place], [place], [address] Phone: [phone]   Fax:  [phone]  Name: Danielle Parrish MRN: [id number] Date of Birth: [birth year]

## 2021-05-24 ENCOUNTER — Ambulatory Visit: Payer: Medicare Other

## 2021-05-24 ENCOUNTER — Other Ambulatory Visit: Payer: Self-pay

## 2021-05-24 ENCOUNTER — Telehealth: Payer: Self-pay

## 2021-05-24 DIAGNOSIS — R293 Abnormal posture: Secondary | ICD-10-CM | POA: Diagnosis not present

## 2021-05-24 DIAGNOSIS — M6281 Muscle weakness (generalized): Secondary | ICD-10-CM | POA: Diagnosis not present

## 2021-05-24 DIAGNOSIS — M545 Low back pain, unspecified: Secondary | ICD-10-CM | POA: Diagnosis not present

## 2021-05-24 DIAGNOSIS — R262 Difficulty in walking, not elsewhere classified: Secondary | ICD-10-CM

## 2021-05-24 DIAGNOSIS — G8929 Other chronic pain: Secondary | ICD-10-CM

## 2021-05-24 NOTE — Telephone Encounter (Signed)
FYI Pt has been called back and r/s

## 2021-05-24 NOTE — Therapy (Signed)
[facility] [place], [place], [address] Phone: [phone]   Fax:  [phone]  Physical Therapy Treatment  Patient Details  Name: Danielle Parrish MRN: [id number] Date of Birth: 1941/05/01 Referring Provider (PT): Caryl Pina MD   Encounter Date: 05/24/2021   PT End of Session - 05/24/21 0754    Visit Number 4    Number of Visits 12    Date for PT Re-Evaluation 08/01/21    Authorization Type PROGRESS NOTE AT 10TH VISIT.  KX MODIFIER AFTER 15 VISITS.    PT Start Time [phone]    PT Stop Time 0820    PT Time Calculation (min) 49 min    Activity Tolerance Patient tolerated treatment well    Behavior During Therapy [facility] for tasks assessed/performed           Past Medical History:  Diagnosis Date  . Anxiety   . Arthritis   . CHF (congestive heart failure) ([place])   . Chronic pain of multiple joints   . Depression   . Essential hypertension   . GERD (gastroesophageal reflux disease)   . Hyperlipidemia   . Hyperlipidemia associated with type 2 diabetes mellitus ([place])   . Hypertension associated with type 2 diabetes mellitus ([place])   . Hypothyroidism   . Osteoporosis   . Sleep apnea    CPAP  . Type 2 diabetes mellitus with hypercholesterolemia ([place])   . Vitamin D deficiency     Past Surgical History:  Procedure Laterality Date  . ABDOMINAL HERNIA REPAIR    . ABDOMINAL HYSTERECTOMY  1977  . BREAST EXCISIONAL BIOPSY Right   . EYE SURGERY  2020   bilateral cataract removal   . KNEE ARTHROSCOPY Left 09/30/2014   Procedure: LEFT KNEE ARTHROSCOPY MEDIAL MENISECTOMY ABRASION CHONDROPLASTY SYNOVECTOMY SUPRAPATELLER CUFF;  Surgeon: Tobi Bastos, MD;  Location: [facility];  Service: Orthopedics;  Laterality: Left;    There were no vitals filed for this visit.   Subjective Assessment - 05/24/21 0733    Subjective COVID-19 screen performed prior to patient entering clinic.   Patient states that her back seems that it  gets worse daily. She expresses her body is tired .    Pertinent History CHF, HTN, Hypothyroidism, OP, DM, left knee surgery.    How long can you stand comfortably? Varies.    How long can you walk comfortably? Varies.    Patient Stated Goals Reduce pain.    Pain Score 9     Pain Location Back    Pain Descriptors / Indicators Aching    Pain Onset More than a month ago    Pain Frequency Constant              [facility] PT Assessment - 05/24/21 0001      Assessment   Medical Diagnosis u                         [facility] Adult PT Treatment/Exercise - 05/24/21 0001      Ambulation/Gait   Gait Comments Continues to demonstrate flexed posture and  genu valgus      Posture/Postural Control   Posture/Postural Control Postural limitations    Postural Limitations Increased thoracic kyphosis    Posture Comments Encouraged core engagement with standing physioball press downs      Self-Care   Other Self-Care Comments  HEP provided      Exercises   Exercises Lumbar;Shoulder      Lumbar  Exercises: Stretches   Active Hamstring Stretch 3 reps;20 seconds   seated   Lower Trunk Rotation 30 seconds;2 reps    Piriformis Stretch 3 reps;10 seconds   seated fig 4   Other Lumbar Stretch Exercise Physioball roll up the wall      Lumbar Exercises: Aerobic   Nustep L1 x10 mins with focus on posture      Shoulder Exercises: Seated   Extension 10 reps;Theraband;Both;AROM    Theraband Level (Shoulder Extension) Level 2 (Red)      Manual Therapy   Manual Therapy Joint mobilization;Soft tissue mobilization;Myofascial release    Manual therapy comments seated edge of plinth flexed over physioball    Joint Mobilization costal vertebral gr II;thoracic PA mob composite movement    Soft tissue mobilization STM to lumbar paraspinals, QL ischemic release, LD STM    Myofascial Release soft rocking / effleurage to refuce fascial tension prior to deeper STM                  PT  Education - 05/24/21 0921    Education Details To increase endurance, counting outloud with heads above head , taught pursed lip breathing as well               PT Long Term Goals - 05/10/21 0909      PT LONG TERM GOAL #1   Title Patient will be independent with HEP    Baseline initial issued 05/10/21    Time 6    Period Weeks    Status On-going      PT LONG TERM GOAL #2   Title Walk a community distance with pain not > 3/10.    Time 6    Period Weeks    Status On-going                 Plan - 05/24/21 0755    Clinical Impression Statement Patient tolerated session well but continues to demonstrate postural and cardiovascular endurance deficits. She expresses concern about the change in symptoms in her mid back as it is intermittently sharp rather than oly the constant dull. Patient was educated that assessment of mobility indicates greater probable musculoskeletal mechanial deficits rather than systemic given her deial of bowel/bladder change, fever, chills, nightsweats, or non -positioanlly provoked symptoms. She demonstrates improved thoracolumbar mobility with reclined lower trunk rotatios at this session. Patient benefits from manual therapy seated and flexed over physioball due to thoracic limiting tolerance to prone laying. Progression of core strength and thoracolumbar mobility encouraged to promote increased ease of postural and cardiovacular endurance recommended dvia skilled PT intervention;    Personal Factors and Comorbidities Comorbidity 1;Comorbidity 2    Comorbidities CHF, HTN, Hypothyroidism, OP, DM, left knee surgery.    Examination-Activity Limitations Other;Locomotion Level    Examination-Participation Restrictions Other    Stability/Clinical Decision Making Evolving/Moderate complexity    Clinical Decision Making Low    Rehab Potential Good    PT Frequency 2x / week    PT Duration 6 weeks    PT Treatment/Interventions ADLs/Self Care Home  Management;Cryotherapy;Electrical Stimulation;Ultrasound;Moist Heat;Functional mobility training;Therapeutic activities;Therapeutic exercise;Manual techniques;Patient/family education;Passive range of motion    PT Next Visit Plan Core exercise progression.  Gentle manual therapy seated flexed over physioball    Consulted and Agree with Plan of Care Patient           Patient will benefit from skilled therapeutic intervention in order to improve the following deficits and impairments:  Pain,Postural dysfunction,Decreased activity tolerance  Visit Diagnosis: Chronic bilateral low back pain without sciatica  Abnormal posture  Muscle weakness (generalized)  Difficulty in walking, not elsewhere classified  Chronic right-sided low back pain, unspecified whether sciatica present     Problem List Patient Active Problem List   Diagnosis Date Noted  . Age-related osteoporosis with current pathological fracture 03/13/2019  . Chronic midline low back pain without sciatica 01/30/2019  . Age-related osteoporosis with current pathol fracture of vertebra ([place]) 10/28/2018  . Calcification of aorta (HCC) 10/02/2018  . Compression fracture of T12 vertebra ([place]) 10/01/2018  . Primary osteoarthritis of right knee 07/20/2017  . Meniscus degeneration, right 06/14/2017  . Varicose veins of left lower extremity with complications 53/20/2334  . Pain medication agreement signed 06/06/2016  . Gout attack 01/11/2016  . OSA (obstructive sleep apnea) 09/14/2015  . Chronic diastolic heart failure ([place]) 09/10/2015  . Orthopnea 09/02/2015  . Dysphagia, pharyngoesophageal phase 08/04/2015  . Anorexia 08/04/2015  . Fatigue 08/04/2015  . Malaise and fatigue 08/04/2015  . Depression, recurrent ([place]) 04/08/2015  . Osteoarthritis of left knee 09/30/2014  . Vitamin D deficiency   . Anxiety   . GERD (gastroesophageal reflux disease)   . Arthritis   . Chronic pain of multiple joints   . Hypertension  associated with type 2 diabetes mellitus ([place]) 05/15/2013  . Hyperlipidemia associated with type 2 diabetes mellitus ([place]) 05/15/2013  . Type 2 diabetes mellitus with hypercholesterolemia ([place]) 05/15/2013  . Morbid obesity ([place]) 05/15/2013  . Hypothyroidism 05/15/2013    [place], DPT 05/24/2021, 9:24 AM  [facility] [address] [place], [place], [address] Phone: [phone]   Fax:  [phone]  Name: Danielle Parrish MRN: [id number] Date of Birth: 02-15-1941

## 2021-05-24 NOTE — Telephone Encounter (Signed)
Patient left a voicemail this morning asking for a call to discuss her upcoming appointment with Amy, NP.  Please call.

## 2021-05-25 ENCOUNTER — Encounter: Payer: Self-pay | Admitting: Family Medicine

## 2021-05-25 ENCOUNTER — Ambulatory Visit (INDEPENDENT_AMBULATORY_CARE_PROVIDER_SITE_OTHER): Payer: Medicare Other | Admitting: Family Medicine

## 2021-05-25 VITALS — BP 156/76 | HR 50 | Temp 97.4°F | Ht 63.0 in | Wt 183.4 lb

## 2021-05-25 DIAGNOSIS — M546 Pain in thoracic spine: Secondary | ICD-10-CM

## 2021-05-25 NOTE — Patient Instructions (Signed)
Acute Back Pain, Adult Acute back pain is sudden and usually short-lived. It is often caused by an injury to the muscles and tissues in the back. The injury may result from:  A muscle or ligament getting overstretched or torn (strained). Ligaments are tissues that connect bones to each other. Lifting something improperly can cause a back strain.  Wear and tear (degeneration) of the spinal disks. Spinal disks are circular tissue that provide cushioning between the bones of the spine (vertebrae).  Twisting motions, such as while playing sports or doing yard work.  A hit to the back.  Arthritis. You may have a physical exam, lab tests, and imaging tests to find the cause of your pain. Acute back pain usually goes away with rest and home care. Follow these instructions at home: Managing pain, stiffness, and swelling  Treatment may include medicines for pain and inflammation that are taken by mouth or applied to the skin, prescription pain medicine, or muscle relaxants. Take over-the-counter and prescription medicines only as told by your health care provider.  Your health care provider may recommend applying ice during the first 24-48 hours after your pain starts. To do this: ? Put ice in a plastic bag. ? Place a towel between your skin and the bag. ? Leave the ice on for 20 minutes, 2-3 times a day.  If directed, apply heat to the affected area as often as told by your health care provider. Use the heat source that your health care provider recommends, such as a moist heat pack or a heating pad. ? Place a towel between your skin and the heat source. ? Leave the heat on for 20-30 minutes. ? Remove the heat if your skin turns bright red. This is especially important if you are unable to feel pain, heat, or cold. You have a greater risk of getting burned. Activity  Do not stay in bed. Staying in bed for more than 1-2 days can delay your recovery.  Sit up and stand up straight. Avoid leaning  forward when you sit or hunching over when you stand. ? If you work at a desk, sit close to it so you do not need to lean over. Keep your chin tucked in. Keep your neck drawn back, and keep your elbows bent at a 90-degree angle (right angle). ? Sit high and close to the steering wheel when you drive. Add lower back (lumbar) support to your car seat, if needed.  Take short walks on even surfaces as soon as you are able. Try to increase the length of time you walk each day.  Do not sit, drive, or stand in one place for more than 30 minutes at a time. Sitting or standing for long periods of time can put stress on your back.  Do not drive or use heavy machinery while taking prescription pain medicine.  Use proper lifting techniques. When you bend and lift, use positions that put less stress on your back: ? Bend your knees. ? Keep the load close to your body. ? Avoid twisting.  Exercise regularly as told by your health care provider. Exercising helps your back heal faster and helps prevent back injuries by keeping muscles strong and flexible.  Work with a physical therapist to make a safe exercise program, as recommended by your health care provider. Do any exercises as told by your physical therapist.   Lifestyle  Maintain a healthy weight. Extra weight puts stress on your back and makes it difficult to have   good posture.  Avoid activities or situations that make you feel anxious or stressed. Stress and anxiety increase muscle tension and can make back pain worse. Learn ways to manage anxiety and stress, such as through exercise. General instructions  Sleep on a firm mattress in a comfortable position. Try lying on your side with your knees slightly bent. If you lie on your back, put a pillow under your knees.  Follow your treatment plan as told by your health care provider. This may include: ? Cognitive or behavioral therapy. ? Acupuncture or massage therapy. ? Meditation or yoga. Contact  a health care provider if:  You have pain that is not relieved with rest or medicine.  You have increasing pain going down into your legs or buttocks.  Your pain does not improve after 2 weeks.  You have pain at night.  You lose weight without trying.  You have a fever or chills. Get help right away if:  You develop new bowel or bladder control problems.  You have unusual weakness or numbness in your arms or legs.  You develop nausea or vomiting.  You develop abdominal pain.  You feel faint. Summary  Acute back pain is sudden and usually short-lived.  Use proper lifting techniques. When you bend and lift, use positions that put less stress on your back.  Take over-the-counter and prescription medicines and apply heat or ice as directed by your health care provider. This information is not intended to replace advice given to you by your health care provider. Make sure you discuss any questions you have with your health care provider. Document Revised: 09/03/2020 Document Reviewed: 09/03/2020 Elsevier Patient Education  2021 [employer]  

## 2021-05-25 NOTE — Progress Notes (Signed)
Acute Office Visit  Subjective:    Patient ID: Danielle Parrish, female    DOB: May 09, 1941, 80 y.o.   MRN: [id number]  Chief Complaint  Patient presents with  . Back Pain    HPI Patient is in today for back pain since a fall on 5/13. She was seen in the ED shortly after. She was given tramadol but is out now. It didn't seem to help. The pain is in the middle of her mid back. She reports that pain is a dull ache with intermittent sharp pain. The pain is an 8/10. She isn't sure what makes the pain worse. Nothing seems to make the pain better. She currently taking prednisone for her knee pain. She has started PT and this has been helpful. She has also been taking tylenol. She denies numbness or tingling, fever, or saddle anesthesia.   Past Medical History:  Diagnosis Date  . Anxiety   . Arthritis   . CHF (congestive heart failure) ([place])   . Chronic pain of multiple joints   . Depression   . Essential hypertension   . GERD (gastroesophageal reflux disease)   . Hyperlipidemia   . Hyperlipidemia associated with type 2 diabetes mellitus ([place])   . Hypertension associated with type 2 diabetes mellitus ([place])   . Hypothyroidism   . Osteoporosis   . Sleep apnea    CPAP  . Type 2 diabetes mellitus with hypercholesterolemia ([place])   . Vitamin D deficiency     Past Surgical History:  Procedure Laterality Date  . ABDOMINAL HERNIA REPAIR    . ABDOMINAL HYSTERECTOMY  1977  . BREAST EXCISIONAL BIOPSY Right   . EYE SURGERY  2020   bilateral cataract removal   . KNEE ARTHROSCOPY Left 09/30/2014   Procedure: LEFT KNEE ARTHROSCOPY MEDIAL MENISECTOMY ABRASION CHONDROPLASTY SYNOVECTOMY SUPRAPATELLER CUFF;  Surgeon: Tobi Bastos, MD;  Location: [facility];  Service: Orthopedics;  Laterality: Left;    Family History  Problem Relation Age of Onset  . Hypertension Mother   . Stroke Mother   . Cancer Father     Social History   Socioeconomic History  . Marital status: Widowed    Spouse  name: Laverna Peace  . Number of children: 2  . Years of education: Not on file  . Highest education level: High school graduate  Occupational History  . Occupation: CNA    Comment: [employer]  . Occupation: retired  Tobacco Use  . Smoking status: Never Smoker  . Smokeless tobacco: Never Used  Vaping Use  . Vaping Use: Never used  Substance and Sexual Activity  . Alcohol use: No  . Drug use: No  . Sexual activity: Not Currently  Other Topics Concern  . Not on file  Social History Narrative  . Not on file   Social Determinants of Health   Financial Resource Strain: Low Risk   . Difficulty of Paying Living Expenses: Not hard at all  Food Insecurity: No Food Insecurity  . Worried About Charity fundraiser in the Last Year: Never true  . Ran Out of Food in the Last Year: Never true  Transportation Needs: No Transportation Needs  . Lack of Transportation (Medical): No  . Lack of Transportation (Non-Medical): No  Physical Activity: Sufficiently Active  . Days of Exercise per Week: 7 days  . Minutes of Exercise per Session: 30 min  Stress: No Stress Concern Present  . Feeling of Stress : Not at all  Social Connections: Socially Isolated  .  Frequency of Communication with Friends and Family: More than three times a week  . Frequency of Social Gatherings with Friends and Family: More than three times a week  . Attends Religious Services: Never  . Active Member of Clubs or Organizations: No  . Attends Archivist Meetings: Never  . Marital Status: Widowed  Intimate Partner Violence: Not At Risk  . Fear of Current or Ex-Partner: No  . Emotionally Abused: No  . Physically Abused: No  . Sexually Abused: No    Outpatient Medications Prior to Visit  Medication Sig Dispense Refill  . acetaminophen (TYLENOL) 500 MG tablet Take 1 tablet (500 mg total) by mouth every 6 (six) hours as needed. 30 tablet 0  . albuterol (VENTOLIN HFA) 108 (90 Base) MCG/ACT inhaler TAKE 2 PUFFS BY  MOUTH EVERY 6 HOURS AS NEEDED FOR WHEEZE OR SHORTNESS OF BREATH 6.7 each 3  . amLODipine (NORVASC) 10 MG tablet Take 1 tablet (10 mg total) by mouth daily. 90 tablet 3  . calcium-vitamin D (OSCAL WITH D) 500-200 MG-UNIT TABS tablet TAKE 1 TABLET BY MOUTH EVERY DAY WITH BREAKFAST 90 tablet 1  . Cholecalciferol (VITAMIN D-3 PO) Take 1 capsule by mouth daily.     . cyclobenzaprine (FLEXERIL) 5 MG tablet Take 1 tablet (5 mg total) by mouth 3 (three) times daily as needed for muscle spasms. 30 tablet 1  . denosumab (PROLIA) 60 MG/ML SOSY injection Bring to your doctor to inject 60 mg subcutaneously. 1 mL 3  . diclofenac Sodium (VOLTAREN) 1 % GEL APPLY 4 GRAMS TOPICALLY 4 (FOUR) TIMES DAILY. 400 g 1  . diphenhydrAMINE (BENADRYL) 25 MG tablet Take 50 mg by mouth at bedtime as needed for allergies.     Marland Kitchen gabapentin (NEURONTIN) 100 MG capsule Take 2 capsules (200 mg total) by mouth 3 (three) times daily. 120 capsule 1  . ketoconazole (NIZORAL) 2 % cream Apply to affected area everyday 60 g 2  . levothyroxine (SYNTHROID) 100 MCG tablet Take 1 tablet (100 mcg total) by mouth daily before breakfast. 90 tablet 3  . lidocaine (LIDODERM) 5 % Place 1 patch onto the skin daily. Remove & Discard patch within 12 hours or as directed by MD 30 patch 0  . lisinopril (ZESTRIL) 20 MG tablet Take 1 tablet (20 mg total) by mouth daily. 90 tablet 3  . lovastatin (MEVACOR) 40 MG tablet TAKE 1 TABLET BY MOUTH EVERYDAY AT BEDTIME 90 tablet 3  . mirabegron ER (MYRBETRIQ) 25 MG TB24 tablet Take 1 tablet (25 mg total) by mouth daily. 30 tablet 3  . Multiple Vitamin (MULTI-VITAMIN) tablet Take by mouth.    . Multiple Vitamins-Minerals (PRESERVISION AREDS 2 PO) Take by mouth.    . nitrofurantoin, macrocrystal-monohydrate, (MACROBID) 100 MG capsule Take 100 mg by mouth daily.    Marland Kitchen nystatin cream (MYCOSTATIN) Apply 1 application topically 2 (two) times daily. 30 g PRN  . omeprazole (PRILOSEC) 20 MG capsule Take 1 capsule (20 mg  total) by mouth 2 (two) times daily before a meal. 180 capsule 1  . predniSONE (DELTASONE) 10 MG tablet Take by mouth.    . PROLIA 60 MG/ML SOSY injection TO BE ADMINISTERED IN PHYSICIAN'S OFFICE. INJECT ONE SYRINGE SUBCOUSLY ONCE EVERY 6 MONTHS. REFRIGERATE. USE WITHIN 14 DAYS ONCE AT ROOM TEMPERATURE. 1 mL 1  . pyridOXINE (VITAMIN B-6) 100 MG tablet Take 100 mg by mouth daily.    . sertraline (ZOLOFT) 100 MG tablet Take 2 tablets (200 mg total) by mouth  daily. 180 tablet 3  . sodium chloride (OCEAN) 0.65 % SOLN nasal spray Place 1 spray into both nostrils as needed for congestion. 1 Bottle prn  . nystatin (MYCOSTATIN/NYSTOP) powder Apply topically 4 (four) times daily. (Patient not taking: Reported on 05/25/2021) 15 g 0  . amoxicillin (AMOXIL) 500 MG capsule Take 1 capsule (500 mg total) by mouth 2 (two) times daily. 20 capsule 0  . aspirin 81 MG chewable tablet Chew 4 tablets by mouth daily.     . traMADol (ULTRAM) 50 MG tablet Take 1 tablet (50 mg total) by mouth every 6 (six) hours as needed. 15 tablet 0   No facility-administered medications prior to visit.    Allergies  Allergen Reactions  . Crestor [Rosuvastatin] Other (See Comments)    Body aches.   . Nsaids     Burns stomach.   . Vytorin [Ezetimibe-Simvastatin]     Body aches.   . Aspirin Nausea Only  . Tolmetin Nausea Only    Burns stomach.     Review of Systems As per HPI.     Objective:    Physical Exam Vitals and nursing note reviewed.  Constitutional:      General: She is not in acute distress.    Appearance: She is not ill-appearing, toxic-appearing or diaphoretic.  Cardiovascular:     Rate and Rhythm: Normal rate and regular rhythm.     Heart sounds: Normal heart sounds. No murmur heard.   Pulmonary:     Effort: Pulmonary effort is normal.     Breath sounds: No wheezing or rales.  Chest:     Chest wall: No tenderness.  Musculoskeletal:     Thoracic back: Tenderness (paraspinal) present. No swelling,  edema, spasms or bony tenderness.     Comments: Kyphosis present.   Neurological:     General: No focal deficit present.     Mental Status: She is alert and oriented to person, place, and time.  Psychiatric:        Mood and Affect: Mood normal.        Behavior: Behavior normal.     BP (!) 156/76   Pulse (!) 50   Temp (!) 97.4 F (36.3 C) (Temporal)   Ht '5\' 3"'  (1.6 m)   Wt 183 lb 6 oz (83.2 kg)   SpO2 93% Comment: room air  BMI 32.48 kg/m  Wt Readings from Last 3 Encounters:  05/25/21 183 lb 6 oz (83.2 kg)  05/15/21 180 lb (81.6 kg)  04/21/21 189 lb (85.7 kg)    There are no preventive care reminders to display for this patient.  There are no preventive care reminders to display for this patient.   Lab Results  Component Value Date   TSH 1.780 04/21/2021   Lab Results  Component Value Date   WBC 6.8 04/21/2021   HGB 14.2 04/21/2021   HCT 41.8 04/21/2021   MCV 94 04/21/2021   PLT 195 04/21/2021   Lab Results  Component Value Date   NA 139 04/21/2021   K 5.1 04/21/2021   CO2 25 04/21/2021   GLUCOSE 111 (H) 04/21/2021   BUN 13 04/21/2021   CREATININE 0.86 04/21/2021   BILITOT 0.4 04/21/2021   ALKPHOS 69 04/21/2021   AST 16 04/21/2021   ALT 20 04/21/2021   PROT 7.0 04/21/2021   ALBUMIN 4.8 (H) 04/21/2021   CALCIUM 10.6 (H) 04/21/2021   ANIONGAP 11 10/17/2019   EGFR 68 04/21/2021   Lab Results  Component Value Date  CHOL 173 04/21/2021   Lab Results  Component Value Date   HDL 49 04/21/2021   Lab Results  Component Value Date   LDLCALC 91 04/21/2021   Lab Results  Component Value Date   TRIG 191 (H) 04/21/2021   Lab Results  Component Value Date   CHOLHDL 3.5 04/21/2021   Lab Results  Component Value Date   HGBA1C 6.1 04/21/2021       Assessment & Plan:   [place] was seen today for back pain.  Diagnoses and all orders for this visit:  Acute midline thoracic back pain Continue tylenol and prednisone. Continue PT. Use voltaren gel  and lidocaine patches for pain. NSAIDs are contraindicated for patient. Return to office for new or worsening symptoms, or if symptoms persist.   The patient indicates understanding of these issues and agrees with the plan.   Gwenlyn Perking, FNP

## 2021-05-26 ENCOUNTER — Telehealth: Payer: Self-pay | Admitting: Family Medicine

## 2021-05-26 ENCOUNTER — Ambulatory Visit: Payer: Medicare Other | Admitting: Family Medicine

## 2021-05-26 ENCOUNTER — Other Ambulatory Visit: Payer: Self-pay | Admitting: Family Medicine

## 2021-05-26 DIAGNOSIS — G8929 Other chronic pain: Secondary | ICD-10-CM

## 2021-05-26 DIAGNOSIS — M545 Low back pain, unspecified: Secondary | ICD-10-CM

## 2021-05-26 MED ORDER — LIDOCAINE 5 % EX PTCH: 1.0000 | MEDICATED_PATCH | CUTANEOUS | 0 refills | Status: DC

## 2021-05-26 NOTE — Telephone Encounter (Signed)
Pt aware.

## 2021-05-26 NOTE — Telephone Encounter (Signed)
Refill sent in

## 2021-05-31 ENCOUNTER — Ambulatory Visit: Payer: Medicare Other | Attending: Family Medicine

## 2021-05-31 ENCOUNTER — Other Ambulatory Visit: Payer: Self-pay

## 2021-05-31 DIAGNOSIS — R293 Abnormal posture: Secondary | ICD-10-CM | POA: Insufficient documentation

## 2021-05-31 DIAGNOSIS — M6281 Muscle weakness (generalized): Secondary | ICD-10-CM | POA: Diagnosis not present

## 2021-05-31 DIAGNOSIS — R262 Difficulty in walking, not elsewhere classified: Secondary | ICD-10-CM | POA: Diagnosis not present

## 2021-05-31 DIAGNOSIS — M545 Low back pain, unspecified: Secondary | ICD-10-CM | POA: Insufficient documentation

## 2021-05-31 DIAGNOSIS — G8929 Other chronic pain: Secondary | ICD-10-CM

## 2021-05-31 NOTE — Therapy (Signed)
[facility] [place], [place], [address] Phone: [phone]   Fax:  [phone]  Physical Therapy Treatment  Patient Details  Name: Danielle Parrish MRN: [id number] Date of Birth: 1941/04/14 Referring Provider (PT): Caryl Pina MD   Encounter Date: 05/31/2021   PT End of Session - 05/31/21 0842    Visit Number 5    Number of Visits 12    Date for PT Re-Evaluation 08/01/21    Authorization Type PROGRESS NOTE AT 10TH VISIT.  KX MODIFIER AFTER 15 VISITS.    PT Start Time 0815    PT Stop Time 0900    PT Time Calculation (min) 45 min    Activity Tolerance Patient tolerated treatment well    Behavior During Therapy WFL for tasks assessed/performed           Past Medical History:  Diagnosis Date  . Anxiety   . Arthritis   . CHF (congestive heart failure) ([place])   . Chronic pain of multiple joints   . Depression   . Essential hypertension   . GERD (gastroesophageal reflux disease)   . Hyperlipidemia   . Hyperlipidemia associated with type 2 diabetes mellitus ([place])   . Hypertension associated with type 2 diabetes mellitus ([place])   . Hypothyroidism   . Osteoporosis   . Sleep apnea    CPAP  . Type 2 diabetes mellitus with hypercholesterolemia ([place])   . Vitamin D deficiency     Past Surgical History:  Procedure Laterality Date  . ABDOMINAL HERNIA REPAIR    . ABDOMINAL HYSTERECTOMY  1977  . BREAST EXCISIONAL BIOPSY Right   . EYE SURGERY  2020   bilateral cataract removal   . KNEE ARTHROSCOPY Left 09/30/2014   Procedure: LEFT KNEE ARTHROSCOPY MEDIAL MENISECTOMY ABRASION CHONDROPLASTY SYNOVECTOMY SUPRAPATELLER CUFF;  Surgeon: Tobi Bastos, MD;  Location: [facility];  Service: Orthopedics;  Laterality: Left;    There were no vitals filed for this visit.   Subjective Assessment - 05/31/21 0821    Subjective COVID-19 screen performed prior to patient entering clinic.   Patient states that her low back is pretty  sore today.    Pertinent History CHF, HTN, Hypothyroidism, OP, DM, left knee surgery.    How long can you stand comfortably? Varies.    How long can you walk comfortably? Varies.    Patient Stated Goals Reduce pain.    Pain Score 7     Pain Location Back    Pain Onset More than a month ago                             [facility] Adult PT Treatment/Exercise - 05/31/21 0001      Exercises   Exercises Lumbar;Shoulder      Lumbar Exercises: Stretches   Active Hamstring Stretch 3 reps;20 seconds    Lower Trunk Rotation 30 seconds;2 reps    Piriformis Stretch 3 reps;10 seconds    Other Lumbar Stretch Exercise Physioball roll up the wall      Lumbar Exercises: Aerobic   Recumbent Bike L4 x 10 mins with pillow behind head on seat 1    Nustep --      Lumbar Exercises: Machines for Strengthening   Other Lumbar Machine Exercise push and pull at low row setting / 15#  x 20      Lumbar Exercises: Standing   Lifting From floor;10 reps    Lifting Limitations using  physioball to cue on form                       PT Long Term Goals - 05/10/21 0909      PT LONG TERM GOAL #1   Title Patient will be independent with HEP    Baseline initial issued 05/10/21    Time 6    Period Weeks    Status On-going      PT LONG TERM GOAL #2   Title Walk a community distance with pain not > 3/10.    Time 6    Period Weeks    Status On-going                 Plan - 05/31/21 4098    Clinical Impression Statement Patient participated well in PT today needing minimal verbal cues for movement pattern needed for squats and lifting. Patient requires ample rest breaks to manage breathing. Thoreacic mobility ist limited due to excessive curvatiure of the spine. Skilled PT continues to focus on mobility and strength to reduce flare up of sharp low back pain.    Personal Factors and Comorbidities Comorbidity 1;Comorbidity 2    Comorbidities CHF, HTN, Hypothyroidism, OP, DM, left  knee surgery.    Examination-Activity Limitations Other;Locomotion Level    Examination-Participation Restrictions Other    Clinical Decision Making Low    Rehab Potential Good    PT Frequency 2x / week    PT Duration 6 weeks    PT Treatment/Interventions ADLs/Self Care Home Management;Cryotherapy;Electrical Stimulation;Ultrasound;Moist Heat;Functional mobility training;Therapeutic activities;Therapeutic exercise;Manual techniques;Patient/family education;Passive range of motion    PT Next Visit Plan Core exercise progression.  Gentle manual therapy seated flexed over physioball           Patient will benefit from skilled therapeutic intervention in order to improve the following deficits and impairments:  Pain,Postural dysfunction,Decreased activity tolerance  Visit Diagnosis: Chronic bilateral low back pain without sciatica  Abnormal posture  Muscle weakness (generalized)  Difficulty in walking, not elsewhere classified  Chronic right-sided low back pain, unspecified whether sciatica present     Problem List Patient Active Problem List   Diagnosis Date Noted  . Age-related osteoporosis with current pathological fracture 03/13/2019  . Chronic midline low back pain without sciatica 01/30/2019  . Age-related osteoporosis with current pathol fracture of vertebra ([place]) 10/28/2018  . Calcification of aorta (H[facility]) 10/02/2018  . Compression fracture of T12 vertebra ([place]) 10/01/2018  . Primary osteoarthritis of right knee 07/20/2017  . Meniscus degeneration, right 06/14/2017  . Varicose veins of left lower extremity with complications 11/91/4782  . Pain medication agreement signed 06/06/2016  . Gout attack 01/11/2016  . OSA (obstructive sleep apnea) 09/14/2015  . Chronic diastolic heart failure ([place]) 09/10/2015  . Orthopnea 09/02/2015  . Dysphagia, pharyngoesophageal phase 08/04/2015  . Anorexia 08/04/2015  . Fatigue 08/04/2015  . Malaise and fatigue 08/04/2015  .  Depression, recurrent ([place]) 04/08/2015  . Osteoarthritis of left knee 09/30/2014  . Vitamin D deficiency   . Anxiety   . GERD (gastroesophageal reflux disease)   . Arthritis   . Chronic pain of multiple joints   . Hypertension associated with type 2 diabetes mellitus ([place]) 05/15/2013  . Hyperlipidemia associated with type 2 diabetes mellitus ([place]) 05/15/2013  . Type 2 diabetes mellitus with hypercholesterolemia ([place]) 05/15/2013  . Morbid obesity ([place]) 05/15/2013  . Hypothyroidism 05/15/2013    Marylou Mccoy PT, DPT 05/31/2021, 9:04 AM  [facility]  [place], [place], [address] Phone: [phone]   Fax:  [phone]  Name: Yenesis Even MRN: [id number] Date of Birth: 1941/07/18

## 2021-06-07 ENCOUNTER — Other Ambulatory Visit: Payer: Self-pay

## 2021-06-07 ENCOUNTER — Ambulatory Visit: Payer: Medicare Other

## 2021-06-07 ENCOUNTER — Ambulatory Visit (INDEPENDENT_AMBULATORY_CARE_PROVIDER_SITE_OTHER): Payer: Medicare Other | Admitting: Family Medicine

## 2021-06-07 ENCOUNTER — Encounter: Payer: Self-pay | Admitting: Family Medicine

## 2021-06-07 VITALS — BP 137/80 | HR 68 | Temp 97.4°F | Ht 63.0 in | Wt 186.8 lb

## 2021-06-07 DIAGNOSIS — I5032 Chronic diastolic (congestive) heart failure: Secondary | ICD-10-CM | POA: Diagnosis not present

## 2021-06-07 DIAGNOSIS — R293 Abnormal posture: Secondary | ICD-10-CM

## 2021-06-07 DIAGNOSIS — M545 Low back pain, unspecified: Secondary | ICD-10-CM | POA: Diagnosis not present

## 2021-06-07 DIAGNOSIS — E559 Vitamin D deficiency, unspecified: Secondary | ICD-10-CM | POA: Diagnosis not present

## 2021-06-07 DIAGNOSIS — R262 Difficulty in walking, not elsewhere classified: Secondary | ICD-10-CM | POA: Diagnosis not present

## 2021-06-07 DIAGNOSIS — M6281 Muscle weakness (generalized): Secondary | ICD-10-CM | POA: Diagnosis not present

## 2021-06-07 DIAGNOSIS — R0602 Shortness of breath: Secondary | ICD-10-CM

## 2021-06-07 DIAGNOSIS — R109 Unspecified abdominal pain: Secondary | ICD-10-CM

## 2021-06-07 DIAGNOSIS — G8929 Other chronic pain: Secondary | ICD-10-CM

## 2021-06-07 DIAGNOSIS — N3001 Acute cystitis with hematuria: Secondary | ICD-10-CM | POA: Diagnosis not present

## 2021-06-07 DIAGNOSIS — R5383 Other fatigue: Secondary | ICD-10-CM | POA: Diagnosis not present

## 2021-06-07 DIAGNOSIS — R6889 Other general symptoms and signs: Secondary | ICD-10-CM | POA: Diagnosis not present

## 2021-06-07 LAB — URINALYSIS, ROUTINE W REFLEX MICROSCOPIC
Bilirubin, UA: NEGATIVE
Glucose, UA: NEGATIVE
Ketones, UA: NEGATIVE
Nitrite, UA: POSITIVE — AB
Protein,UA: NEGATIVE
Specific Gravity, UA: 1.015 (ref 1.005–1.030)
Urobilinogen, Ur: 0.2 mg/dL (ref 0.2–1.0)
pH, UA: 6.5 (ref 5.0–7.5)

## 2021-06-07 LAB — MICROSCOPIC EXAMINATION: RBC, Urine: NONE SEEN /hpf (ref 0–2)

## 2021-06-07 MED ORDER — CEFDINIR 300 MG PO CAPS: 300.0000 mg | ORAL_CAPSULE | Freq: Two times a day (BID) | ORAL | 0 refills | Status: DC

## 2021-06-07 NOTE — Patient Instructions (Signed)
Tylenol 1,000 mg three times daily for pain. Heating pad Lidoderm patch

## 2021-06-07 NOTE — Therapy (Signed)
[facility] [place], [place], [address] Phone: [phone]   Fax:  [phone]  Physical Therapy Treatment  Patient Details  Name: Danielle Parrish MRN: [id number] Date of Birth: August 17, 1941 Referring Provider (PT): Caryl Pina MD   Encounter Date: 06/07/2021   PT End of Session - 06/07/21 0810     Visit Number 6    Number of Visits 12    Date for PT Re-Evaluation 08/01/21    Authorization Type PROGRESS NOTE AT 10TH VISIT.  KX MODIFIER AFTER 15 VISITS.    PT Start Time [id number]    PT Stop Time 0815    PT Time Calculation (min) 40 min    Equipment Utilized During Treatment Other (comment)   pillow x 3 ; bolster for recline in incline to reduce GERD effects.   Activity Tolerance Patient tolerated treatment well;Patient limited by pain    Behavior During Therapy WFL for tasks assessed/performed             Past Medical History:  Diagnosis Date   Anxiety    Arthritis    CHF (congestive heart failure) (HCC)    Chronic pain of multiple joints    Depression    Essential hypertension    GERD (gastroesophageal reflux disease)    Hyperlipidemia    Hyperlipidemia associated with type 2 diabetes mellitus ([place])    Hypertension associated with type 2 diabetes mellitus ([place])    Hypothyroidism    Osteoporosis    Sleep apnea    CPAP   Type 2 diabetes mellitus with hypercholesterolemia ([place])    Vitamin D deficiency     Past Surgical History:  Procedure Laterality Date   ABDOMINAL HERNIA REPAIR     ABDOMINAL HYSTERECTOMY  1977   BREAST EXCISIONAL BIOPSY Right    EYE SURGERY  2020   bilateral cataract removal    KNEE ARTHROSCOPY Left 09/30/2014   Procedure: LEFT KNEE ARTHROSCOPY MEDIAL MENISECTOMY ABRASION CHONDROPLASTY SYNOVECTOMY SUPRAPATELLER CUFF;  Surgeon: Tobi Bastos, MD;  Location: [facility];  Service: Orthopedics;  Laterality: Left;    There were no vitals filed for this visit.   Subjective Assessment -  06/07/21 0735     Subjective COVID-19 screen performed prior to patient entering clinic.   Patient states that her back pain wraps arond to her stomach on the R side. She can only get relief by laying on a heat pack and on that side. She wants to go to the MD after this appt to be further assessed regarding her back and flank pain.    Pertinent History CHF, HTN, Hypothyroidism, OP, DM, left knee surgery.    Currently in Pain? Yes    Pain Score 8     Pain Location Back    Pain Orientation Right;Mid    Pain Descriptors / Indicators Aching;Sharp    Pain Type Chronic pain    Pain Radiating Towards to flank region    Pain Onset More than a month ago                               [facility] Adult PT Treatment/Exercise - 06/07/21 0735       Posture/Postural Control   Posture/Postural Control Postural limitations    Postural Limitations Increased thoracic kyphosis;Flexed trunk;Rounded Shoulders;Forward head      Lumbar Exercises: Stretches   Active Hamstring Stretch 5 reps;Left;Right;10 seconds    Single Knee to Chest Stretch 5  reps;20 seconds    Double Knee to Chest Stretch 5 reps;20 seconds    Lower Trunk Rotation 3 reps;60 seconds    Piriformis Stretch 30 seconds;4 reps    Figure 4 Stretch 4 reps;30 seconds    Other Lumbar Stretch Exercise seated side bend with R hand elevated to promote rotation of the spine    Other Lumbar Stretch Exercise seated physioball roll to the side                         PT Long Term Goals - 05/31/21 0900       PT LONG TERM GOAL #1   Title Patient will be independent with HEP    Baseline initial issued 05/10/21    Time 6    Period Weeks    Status On-going      PT LONG TERM GOAL #2   Title Walk a community distance with pain not > 3/10.    Time 6    Period Weeks    Status On-going                   Plan - 06/07/21 0813     Clinical Impression Statement Patient participated fair in PT today but limited  with mobility secondary to pain at the flank region increased with thoracic rotation when transfering. She demonstrated limited mobiliy and requires supervision and VCs to reduce applying greater strain to lumbar region. Patient to see MD this morning following PT. Plans at this time to continue skille PT to address mobility deficits secondary to abnormal posture and weakness.    Personal Factors and Comorbidities Comorbidity 1;Comorbidity 2    Comorbidities CHF, HTN, Hypothyroidism, OP, DM, left knee surgery.    Examination-Activity Limitations Other;Locomotion Level    Examination-Participation Restrictions Other    Stability/Clinical Decision Making Evolving/Moderate complexity    Clinical Decision Making Low    Rehab Potential Good    PT Frequency 2x / week    PT Duration 6 weeks    PT Treatment/Interventions ADLs/Self Care Home Management;Cryotherapy;Electrical Stimulation;Ultrasound;Moist Heat;Functional mobility training;Therapeutic activities;Therapeutic exercise;Manual techniques;Patient/family education;Passive range of motion    PT Next Visit Plan Core and proximal hip strenght exercise progression.    Consulted and Agree with Plan of Care Patient             Patient will benefit from skilled therapeutic intervention in order to improve the following deficits and impairments:  Pain, Postural dysfunction, Decreased activity tolerance  Visit Diagnosis: Chronic bilateral low back pain without sciatica  Abnormal posture  Muscle weakness (generalized)  Difficulty in walking, not elsewhere classified  Chronic right-sided low back pain, unspecified whether sciatica present     Problem List Patient Active Problem List   Diagnosis Date Noted   Age-related osteoporosis with current pathological fracture 03/13/2019   Chronic midline low back pain without sciatica 01/30/2019   Age-related osteoporosis with current pathol fracture of vertebra ([place]) 10/28/2018   Calcification  of aorta ([place]) 10/02/2018   Compression fracture of T12 vertebra ([place]) 10/01/2018   Primary osteoarthritis of right knee 07/20/2017   Meniscus degeneration, right 06/14/2017   Varicose veins of left lower extremity with complications 44/12/270   Pain medication agreement signed 06/06/2016   Gout attack 01/11/2016   OSA (obstructive sleep apnea) 09/14/2015   Chronic diastolic heart failure ([place]) 09/10/2015   Orthopnea 09/02/2015   Dysphagia, pharyngoesophageal phase 08/04/2015   Anorexia 08/04/2015   Fatigue 08/04/2015   Malaise and  fatigue 08/04/2015   Depression, recurrent ([place]) 04/08/2015   Osteoarthritis of left knee 09/30/2014   Vitamin D deficiency    Anxiety    GERD (gastroesophageal reflux disease)    Arthritis    Chronic pain of multiple joints    Hypertension associated with type 2 diabetes mellitus ([place]) 05/15/2013   Hyperlipidemia associated with type 2 diabetes mellitus ([place]) 05/15/2013   Type 2 diabetes mellitus with hypercholesterolemia ([place]) 05/15/2013   Morbid obesity ([place]) 05/15/2013   Hypothyroidism 05/15/2013    Marylou Mccoy PT, DPT 06/07/2021, 9:08 AM  [facility] [place], [place], [address] Phone: [phone]   Fax:  [phone]  Name: Reece Mcbroom MRN: [id number] Date of Birth: January 12, 1941

## 2021-06-07 NOTE — Progress Notes (Signed)
Assessment & Plan:  1. Acute cystitis with hematuria Encouraged adequate hydration. - Urine Culture - cefdinir (OMNICEF) 300 MG capsule; Take 1 capsule (300 mg total) by mouth 2 (two) times daily for 7 days.  Dispense: 14 capsule; Refill: 0  2. Right flank pain Tylenol as needed. Continue heating pad since it is helpful. - Urinalysis, Routine w reflex microscopic - Urine dipstick shows positive for RBC's, positive for nitrates, and positive for leukocytes.  Micro exam: many bacteria. - Microscopic Examination  3. Fatigue, unspecified type Labs to assess for cause. - Anemia Profile B - CMP14+EGFR - TSH - VITAMIN D 25 Hydroxy (Vit-D Deficiency, Fractures) - Brain natriuretic peptide  4. Exertional shortness of breath - Brain natriuretic peptide  5. Chronic diastolic heart failure (HCC) - Brain natriuretic peptide   Follow up plan: Return if symptoms worsen or fail to improve.  Danielle Limes, MSN, APRN, FNP-C [facility]  Subjective:   Patient ID: Danielle Parrish, female    DOB: 11/22/1941, 80 y.o.   MRN: [id number]  HPI: Danielle Parrish is a 80 y.o. female presenting on 06/07/2021 for Back Pain (Patient states that she has been having right lower back pain that has been going on since x 3 weeks after a fall. ) and Fatigue (Patient states it has been ongoing but gotten worse )  Patient reports right-sided back pain that has been going on for more than 3 weeks.  States it started after a fall in the yard last month.  Reports the pain is different than it was initially.  It starts on the right side of her back and wraps around her side to her lower abdomen.  She describes the pain as sharp.  Tylenol is not helpful.  Heating pad is helpful.  Patient also reports increased fatigue and shortness of breath.  She does have a diagnosis of heart failure but states she does not have a cardiologist.  She is unable to walk to her mailbox and back without  stopping to rest.  She has an albuterol inhaler but states it does not help.   ROS: Negative unless specifically indicated above in HPI.   Relevant past medical history reviewed and updated as indicated.   Allergies and medications reviewed and updated.   Current Outpatient Medications:    acetaminophen (TYLENOL) 500 MG tablet, Take 1 tablet (500 mg total) by mouth every 6 (six) hours as needed., Disp: 30 tablet, Rfl: 0   albuterol (VENTOLIN HFA) 108 (90 Base) MCG/ACT inhaler, TAKE 2 PUFFS BY MOUTH EVERY 6 HOURS AS NEEDED FOR WHEEZE OR SHORTNESS OF BREATH, Disp: 6.7 each, Rfl: 3   amLODipine (NORVASC) 10 MG tablet, Take 1 tablet (10 mg total) by mouth daily., Disp: 90 tablet, Rfl: 3   calcium-vitamin D (OSCAL WITH D) 500-200 MG-UNIT TABS tablet, TAKE 1 TABLET BY MOUTH EVERY DAY WITH BREAKFAST, Disp: 90 tablet, Rfl: 1   Cholecalciferol (VITAMIN D-3 PO), Take 1 capsule by mouth daily. , Disp: , Rfl:    cyclobenzaprine (FLEXERIL) 5 MG tablet, Take 1 tablet (5 mg total) by mouth 3 (three) times daily as needed for muscle spasms., Disp: 30 tablet, Rfl: 1   denosumab (PROLIA) 60 MG/ML SOSY injection, Bring to your doctor to inject 60 mg subcutaneously., Disp: 1 mL, Rfl: 3   diclofenac Sodium (VOLTAREN) 1 % GEL, APPLY 4 GRAMS TOPICALLY 4 (FOUR) TIMES DAILY., Disp: 400 g, Rfl: 1   diphenhydrAMINE (BENADRYL) 25 MG tablet, Take 50 mg by  mouth at bedtime as needed for allergies. , Disp: , Rfl:    ketoconazole (NIZORAL) 2 % cream, Apply to affected area everyday, Disp: 60 g, Rfl: 2   levothyroxine (SYNTHROID) 100 MCG tablet, Take 1 tablet (100 mcg total) by mouth daily before breakfast., Disp: 90 tablet, Rfl: 3   lidocaine (LIDODERM) 5 %, Place 1 patch onto the skin daily. Remove & Discard patch within 12 hours or as directed by MD, Disp: 30 patch, Rfl: 0   lisinopril (ZESTRIL) 20 MG tablet, Take 1 tablet (20 mg total) by mouth daily., Disp: 90 tablet, Rfl: 3   lovastatin (MEVACOR) 40 MG tablet, TAKE 1  TABLET BY MOUTH EVERYDAY AT BEDTIME, Disp: 90 tablet, Rfl: 3   mirabegron ER (MYRBETRIQ) 25 MG TB24 tablet, Take 1 tablet (25 mg total) by mouth daily., Disp: 30 tablet, Rfl: 3   Multiple Vitamin (MULTI-VITAMIN) tablet, Take by mouth., Disp: , Rfl:    Multiple Vitamins-Minerals (PRESERVISION AREDS 2 PO), Take by mouth., Disp: , Rfl:    nitrofurantoin, macrocrystal-monohydrate, (MACROBID) 100 MG capsule, Take 100 mg by mouth daily., Disp: , Rfl:    nystatin (MYCOSTATIN/NYSTOP) powder, Apply topically 4 (four) times daily., Disp: 15 g, Rfl: 0   nystatin cream (MYCOSTATIN), Apply 1 application topically 2 (two) times daily., Disp: 30 g, Rfl: PRN   omeprazole (PRILOSEC) 20 MG capsule, Take 1 capsule (20 mg total) by mouth 2 (two) times daily before a meal., Disp: 180 capsule, Rfl: 1   predniSONE (DELTASONE) 10 MG tablet, Take by mouth., Disp: , Rfl:    PROLIA 60 MG/ML SOSY injection, TO BE ADMINISTERED IN PHYSICIAN'S OFFICE. INJECT ONE SYRINGE SUBCOUSLY ONCE EVERY 6 MONTHS. REFRIGERATE. USE WITHIN 14 DAYS ONCE AT ROOM TEMPERATURE., Disp: 1 mL, Rfl: 1   pyridOXINE (VITAMIN B-6) 100 MG tablet, Take 100 mg by mouth daily., Disp: , Rfl:    sertraline (ZOLOFT) 100 MG tablet, Take 2 tablets (200 mg total) by mouth daily., Disp: 180 tablet, Rfl: 3   sodium chloride (OCEAN) 0.65 % SOLN nasal spray, Place 1 spray into both nostrils as needed for congestion., Disp: 1 Bottle, Rfl: prn   gabapentin (NEURONTIN) 100 MG capsule, Take 2 capsules (200 mg total) by mouth 3 (three) times daily., Disp: 120 capsule, Rfl: 1  Allergies  Allergen Reactions   Crestor [Rosuvastatin] Other (See Comments)    Body aches.    Nsaids     Burns stomach.    Vytorin [Ezetimibe-Simvastatin]     Body aches.    Aspirin Nausea Only   Tolmetin Nausea Only    Burns stomach.     Objective:   BP 137/80   Pulse 68   Temp (!) 97.4 F (36.3 C) (Temporal)   Ht '5\' 3"'  (1.6 m)   Wt 186 lb 12.8 oz (84.7 kg)   SpO2 95%   BMI 33.09  kg/m    Physical Exam Vitals reviewed.  Constitutional:      General: She is not in acute distress.    Appearance: Normal appearance. She is not ill-appearing, toxic-appearing or diaphoretic.  HENT:     Head: Normocephalic and atraumatic.  Eyes:     General: No scleral icterus.       Right eye: No discharge.        Left eye: No discharge.     Conjunctiva/sclera: Conjunctivae normal.  Cardiovascular:     Rate and Rhythm: Normal rate and regular rhythm.     Heart sounds: Normal heart sounds. No murmur  heard.   No friction rub. No gallop.  Pulmonary:     Effort: Pulmonary effort is normal. No respiratory distress.     Breath sounds: Normal breath sounds. No stridor. No wheezing, rhonchi or rales.  Abdominal:     Tenderness: There is no right CVA tenderness or left CVA tenderness.  Musculoskeletal:        General: Normal range of motion.     Cervical back: Normal range of motion.     Thoracic back: Normal.  Skin:    General: Skin is warm and dry.     Capillary Refill: Capillary refill takes less than 2 seconds.  Neurological:     General: No focal deficit present.     Mental Status: She is alert and oriented to person, place, and time. Mental status is at baseline.  Psychiatric:        Mood and Affect: Mood normal.        Behavior: Behavior normal.        Thought Content: Thought content normal.        Judgment: Judgment normal.

## 2021-06-08 LAB — ANEMIA PROFILE B
Basophils Absolute: 0 10*3/uL (ref 0.0–0.2)
Basos: 0 %
EOS (ABSOLUTE): 0.1 10*3/uL (ref 0.0–0.4)
Eos: 1 %
Ferritin: 358 ng/mL — ABNORMAL HIGH (ref 15–150)
Folate: >20 ng/mL (ref >3.0)
Hematocrit: 41.5 % (ref 34.0–46.6)
Hemoglobin: 13.8 g/dL (ref 11.1–15.9)
Immature Grans (Abs): 0 10*3/uL (ref 0.0–0.1)
Immature Granulocytes: 0 %
Iron Saturation: 27 % (ref 15–55)
Iron: 78 ug/dL (ref 27–139)
Lymphocytes Absolute: 2.3 10*3/uL (ref 0.7–3.1)
Lymphs: 20 %
MCH: 31.5 pg (ref 26.6–33.0)
MCHC: 33.3 g/dL (ref 31.5–35.7)
MCV: 95 fL (ref 79–97)
Monocytes Absolute: 0.8 10*3/uL (ref 0.1–0.9)
Monocytes: 7 %
Neutrophils Absolute: 8.1 10*3/uL — ABNORMAL HIGH (ref 1.4–7.0)
Neutrophils: 72 %
Platelets: 194 10*3/uL (ref 150–450)
RBC: 4.38 x10E6/uL (ref 3.77–5.28)
RDW: 12.5 % (ref 11.7–15.4)
Retic Ct Pct: 1.9 % (ref 0.6–2.6)
Total Iron Binding Capacity: 288 ug/dL (ref 250–450)
UIBC: 210 ug/dL (ref 118–369)
Vitamin B-12: 677 pg/mL (ref 232–1245)
WBC: 11.3 10*3/uL — ABNORMAL HIGH (ref 3.4–10.8)

## 2021-06-08 LAB — TSH: TSH: 1.99 u[IU]/mL (ref 0.450–4.500)

## 2021-06-08 LAB — CMP14+EGFR
ALT: 21 IU/L (ref 0–32)
AST: 11 IU/L (ref 0–40)
Albumin/Globulin Ratio: 2 (ref 1.2–2.2)
Albumin: 4.4 g/dL (ref 3.7–4.7)
Alkaline Phosphatase: 117 IU/L (ref 44–121)
BUN/Creatinine Ratio: 19 (ref 12–28)
BUN: 12 mg/dL (ref 8–27)
Bilirubin Total: 0.5 mg/dL (ref 0.0–1.2)
CO2: 25 mmol/L (ref 20–29)
Calcium: 10.4 mg/dL — ABNORMAL HIGH (ref 8.7–10.3)
Chloride: 96 mmol/L (ref 96–106)
Creatinine, Ser: 0.62 mg/dL (ref 0.57–1.00)
Globulin, Total: 2.2 g/dL (ref 1.5–4.5)
Glucose: 100 mg/dL — ABNORMAL HIGH (ref 65–99)
Potassium: 4.2 mmol/L (ref 3.5–5.2)
Sodium: 137 mmol/L (ref 134–144)
Total Protein: 6.6 g/dL (ref 6.0–8.5)
eGFR: 90 mL/min/{1.73_m2} (ref >59)

## 2021-06-08 LAB — BRAIN NATRIURETIC PEPTIDE: BNP: 25.7 pg/mL (ref 0.0–100.0)

## 2021-06-08 LAB — VITAMIN D 25 HYDROXY (VIT D DEFICIENCY, FRACTURES): Vit D, 25-Hydroxy: 95.7 ng/mL (ref 30.0–100.0)

## 2021-06-11 LAB — URINE CULTURE

## 2021-06-14 ENCOUNTER — Other Ambulatory Visit: Payer: Self-pay

## 2021-06-14 ENCOUNTER — Ambulatory Visit (INDEPENDENT_AMBULATORY_CARE_PROVIDER_SITE_OTHER): Payer: Medicare Other

## 2021-06-14 ENCOUNTER — Encounter: Payer: Self-pay | Admitting: Family

## 2021-06-14 ENCOUNTER — Ambulatory Visit: Payer: Medicare Other

## 2021-06-14 ENCOUNTER — Ambulatory Visit (INDEPENDENT_AMBULATORY_CARE_PROVIDER_SITE_OTHER): Payer: Medicare Other | Admitting: Family

## 2021-06-14 VITALS — BP 132/68 | HR 66 | Temp 97.9°F | Ht 63.0 in | Wt 186.5 lb

## 2021-06-14 DIAGNOSIS — R109 Unspecified abdominal pain: Secondary | ICD-10-CM | POA: Diagnosis not present

## 2021-06-14 DIAGNOSIS — M6281 Muscle weakness (generalized): Secondary | ICD-10-CM

## 2021-06-14 DIAGNOSIS — R293 Abnormal posture: Secondary | ICD-10-CM | POA: Diagnosis not present

## 2021-06-14 DIAGNOSIS — R262 Difficulty in walking, not elsewhere classified: Secondary | ICD-10-CM | POA: Diagnosis not present

## 2021-06-14 DIAGNOSIS — G8929 Other chronic pain: Secondary | ICD-10-CM | POA: Diagnosis not present

## 2021-06-14 DIAGNOSIS — R1031 Right lower quadrant pain: Secondary | ICD-10-CM | POA: Diagnosis not present

## 2021-06-14 DIAGNOSIS — M545 Low back pain, unspecified: Secondary | ICD-10-CM | POA: Diagnosis not present

## 2021-06-14 LAB — URINALYSIS, ROUTINE W REFLEX MICROSCOPIC
Bilirubin, UA: NEGATIVE
Glucose, UA: NEGATIVE
Ketones, UA: NEGATIVE
Nitrite, UA: NEGATIVE
Protein,UA: NEGATIVE
RBC, UA: NEGATIVE
Specific Gravity, UA: 1.015 (ref 1.005–1.030)
Urobilinogen, Ur: 0.2 mg/dL (ref 0.2–1.0)
pH, UA: 5 (ref 5.0–7.5)

## 2021-06-14 LAB — CBC WITH DIFFERENTIAL/PLATELET
Basophils Absolute: 0 10*3/uL (ref 0.0–0.2)
Basos: 0 %
EOS (ABSOLUTE): 0.1 10*3/uL (ref 0.0–0.4)
Eos: 1 %
Hematocrit: 43.4 % (ref 34.0–46.6)
Hemoglobin: 13.9 g/dL (ref 11.1–15.9)
Immature Grans (Abs): 0 10*3/uL (ref 0.0–0.1)
Immature Granulocytes: 0 %
Lymphocytes Absolute: 2.5 10*3/uL (ref 0.7–3.1)
Lymphs: 33 %
MCH: 30.6 pg (ref 26.6–33.0)
MCHC: 32 g/dL (ref 31.5–35.7)
MCV: 96 fL (ref 79–97)
Monocytes Absolute: 0.7 10*3/uL (ref 0.1–0.9)
Monocytes: 9 %
Neutrophils Absolute: 4.3 10*3/uL (ref 1.4–7.0)
Neutrophils: 57 %
Platelets: 230 10*3/uL (ref 150–450)
RBC: 4.54 x10E6/uL (ref 3.77–5.28)
RDW: 12.1 % (ref 11.7–15.4)
WBC: 7.7 10*3/uL (ref 3.4–10.8)

## 2021-06-14 LAB — MICROSCOPIC EXAMINATION: RBC, Urine: NONE SEEN /hpf (ref 0–2)

## 2021-06-14 NOTE — Therapy (Addendum)
[facility] [place], [place], [address] Phone: [phone]   Fax:  [phone]  Physical Therapy Treatment  Patient Details  Name: Danielle Parrish MRN: [id number] Date of Birth: 09-17-1941 Referring Provider (PT): Caryl Pina MD   Encounter Date: 06/14/2021   PT End of Session - 06/14/21 0827     Visit Number 7    Number of Visits 12    Date for PT Re-Evaluation 08/01/21    Authorization Type PROGRESS NOTE AT 10TH VISIT.  KX MODIFIER AFTER 15 VISITS.    PT Start Time 0730    PT Stop Time 0815    PT Time Calculation (min) 45 min    Activity Tolerance Patient tolerated treatment well;Patient limited by pain             Past Medical History:  Diagnosis Date   Anxiety    Arthritis    CHF (congestive heart failure) (HCC)    Chronic pain of multiple joints    Depression    Essential hypertension    GERD (gastroesophageal reflux disease)    Hyperlipidemia    Hyperlipidemia associated with type 2 diabetes mellitus ([place])    Hypertension associated with type 2 diabetes mellitus ([place])    Hypothyroidism    Osteoporosis    Sleep apnea    CPAP   Type 2 diabetes mellitus with hypercholesterolemia ([place])    Vitamin D deficiency     Past Surgical History:  Procedure Laterality Date   ABDOMINAL HERNIA REPAIR     ABDOMINAL HYSTERECTOMY  1977   BREAST EXCISIONAL BIOPSY Right    EYE SURGERY  2020   bilateral cataract removal    KNEE ARTHROSCOPY Left 09/30/2014   Procedure: LEFT KNEE ARTHROSCOPY MEDIAL MENISECTOMY ABRASION CHONDROPLASTY SYNOVECTOMY SUPRAPATELLER CUFF;  Surgeon: Tobi Bastos, MD;  Location: [facility];  Service: Orthopedics;  Laterality: Left;    There were no vitals filed for this visit.   Subjective Assessment - 06/14/21 0748     Subjective COVID-19 screen performed prior to patient entering clinic.   Patient reports that she was given an antibiotic for a UTI last week from her MD. She states  that she continues to have sharp pain in the R hip and abdomen and sharp aches in the middle of her back.    Pertinent History CHF, HTN, Hypothyroidism, OP, DM, left knee surgery.    Patient Stated Goals Reduce pain.    Currently in Pain? Yes    Pain Score 10-Worst pain ever   pain in abdomen - unlike other pains she has had before   Pain Location Abdomen    Pain Orientation Right    Pain Descriptors / Indicators Sore;Sharp    Pain Type Other (Comment)    Pain Onset 1 to 4 weeks ago    Pain Frequency Intermittent    Aggravating Factors  laying down    Multiple Pain Sites Yes    Pain Score 8    Pain Location Back    Pain Orientation Mid;Lower    Pain Descriptors / Indicators Aching;Tender;Sore;Tightness    Pain Type Chronic pain    Pain Onset More than a month ago    Pain Frequency Constant    Aggravating Factors  changing position                               [facility] Adult PT Treatment/Exercise - 06/14/21 0730  Bed Mobility   Bed Mobility Rolling Right;Rolling Left;Supine to Sit    Rolling Right Contact Guard/Touching assist    Rolling Left Contact Guard/Touching assist    Supine to Sit Minimal Assistance - Patient > 75%      Posture/Postural Control   Posture/Postural Control Postural limitations    Postural Limitations Rounded Shoulders;Increased thoracic kyphosis      Exercises   Exercises Other Exercises    Other Exercises  Recumbent bike 10 mins; LTR 5 mins, DKTC with feet on physioball 3 mins x 2; open books 2 min each, seated hamstring stretch 5 x 15sec, hip flexor stretch - modified thomas 3 x 20sec      Manual Therapy   Manual Therapy Soft tissue mobilization    Manual therapy comments STM to that hip flexor and quad region                    PT Education - 06/14/21 0827     Education Details Follow up with MD regarding sharp pain progression at the abdomen - if night sweats are persisent and with pain - see MD.    Person(s)  Educated Patient    Methods Explanation    Comprehension Verbalized understanding                 PT Long Term Goals - 05/31/21 0900       PT LONG TERM GOAL #1   Title Patient will be independent with HEP    Baseline initial issued 05/10/21    Time 6    Period Weeks    Status On-going      PT LONG TERM GOAL #2   Title Walk a community distance with pain not > 3/10.    Time 6    Period Weeks    Status On-going                   Plan - 06/14/21 4315     Clinical Impression Statement Patient with fair participation in PT however limited by pain at the R abdominal and plank region. Palpable nodule noted at the R lower abdominal lower lumbar quadrant. She has difficulty with transfers and bed mobility this visit due to pain. She had some progression and symptom management with improved ROM performing open books this visit. Skilled PT recommended to improve independence with mobility and transfers.    Personal Factors and Comorbidities Comorbidity 1;Comorbidity 2    Comorbidities CHF, HTN, Hypothyroidism, OP, DM, left knee surgery.    Examination-Activity Limitations Other;Locomotion Level    Examination-Participation Restrictions Other    Stability/Clinical Decision Making Evolving/Moderate complexity    Clinical Decision Making Low             Patient will benefit from skilled therapeutic intervention in order to improve the following deficits and impairments:  Pain, Postural dysfunction, Decreased activity tolerance  Visit Diagnosis: Chronic bilateral low back pain without sciatica  Abnormal posture  Muscle weakness (generalized)  Difficulty in walking, not elsewhere classified  Chronic right-sided low back pain, unspecified whether sciatica present     Problem List Patient Active Problem List   Diagnosis Date Noted   Age-related osteoporosis with current pathological fracture 03/13/2019   Chronic midline low back pain without sciatica  01/30/2019   Age-related osteoporosis with current pathol fracture of vertebra ([place]) 10/28/2018   Calcification of aorta (HCC) 10/02/2018   Compression fracture of T12 vertebra ([place]) 10/01/2018   Primary osteoarthritis of right knee 07/20/2017  Meniscus degeneration, right 06/14/2017   Varicose veins of left lower extremity with complications 83/77/9396   Pain medication agreement signed 06/06/2016   Gout attack 01/11/2016   OSA (obstructive sleep apnea) 09/14/2015   Chronic diastolic heart failure ([place]) 09/10/2015   Orthopnea 09/02/2015   Dysphagia, pharyngoesophageal phase 08/04/2015   Anorexia 08/04/2015   Fatigue 08/04/2015   Malaise and fatigue 08/04/2015   Depression, recurrent ([place]) 04/08/2015   Osteoarthritis of left knee 09/30/2014   Vitamin D deficiency    Anxiety    GERD (gastroesophageal reflux disease)    Arthritis    Chronic pain of multiple joints    Hypertension associated with type 2 diabetes mellitus ([place]) 05/15/2013   Hyperlipidemia associated with type 2 diabetes mellitus ([place]) 05/15/2013   Type 2 diabetes mellitus with hypercholesterolemia ([place]) 05/15/2013   Morbid obesity ([place]) 05/15/2013   Hypothyroidism 05/15/2013    Marylou Mccoy PT, DPT 06/14/2021, 8:47 AM  [facility] [place], [place], [address] Phone: [phone]   Fax:  [phone]  Name: Danielle Parrish MRN: [id number] Date of Birth: Jan 12, 1941  PHYSICAL THERAPY DISCHARGE SUMMARY  Visits from Start of Care: 7.  Current functional level related to goals / functional outcomes: See above.   Remaining deficits: See below.   Education / Equipment: HEP.   Patient agrees to discharge. Patient goals were not met. Patient is being discharged due to not returning since the last visit.     Mali Applegate MPT

## 2021-06-14 NOTE — Patient Instructions (Signed)

## 2021-06-14 NOTE — Progress Notes (Signed)
Subjective:    Patient ID: Danielle Parrish, female    DOB: 06-16-1941, 80 y.o.   MRN: [id number]  Chief Complaint  Patient presents with   Abdominal Pain   Pt presents to the office today with right lower abdominal pain. She reports she fell in May and has been hurting ever since. She is currently working with PT and states her pain was a 10 out 10 in her abdomen and back and needed to be seen.   She was treated for a UTI on 06/07/21 and completed cefdinir yesterday.  Abdominal Pain This is a recurrent problem. The current episode started 1 to 4 weeks ago. The problem occurs intermittently. The problem has been unchanged. The pain is located in the RLQ. The pain is at a severity of 10/10. The pain is moderate. The quality of the pain is sharp. Pain radiation: back. Associated symptoms include flatus and nausea. Pertinent negatives include no belching, constipation, diarrhea, dysuria, fever, frequency, hematuria or vomiting. Exacerbated by: laying down. She has tried acetaminophen for the symptoms. The treatment provided mild relief.     Review of Systems  Constitutional:  Negative for fever.  Gastrointestinal:  Positive for abdominal pain, flatus and nausea. Negative for constipation, diarrhea and vomiting.  Genitourinary:  Negative for dysuria, frequency and hematuria.  All other systems reviewed and are negative.     Objective:   Physical Exam Vitals reviewed.  Constitutional:      General: She is not in acute distress.    Appearance: She is well-developed.  HENT:     Head: Normocephalic and atraumatic.     Right Ear: External ear normal.  Eyes:     Pupils: Pupils are equal, round, and reactive to light.  Neck:     Thyroid: No thyromegaly.  Cardiovascular:     Rate and Rhythm: Normal rate and regular rhythm.     Heart sounds: Normal heart sounds. No murmur heard. Pulmonary:     Effort: Pulmonary effort is normal. No respiratory distress.     Breath sounds: Normal  breath sounds. No wheezing.  Abdominal:     General: Bowel sounds are normal. There is no distension.     Palpations: Abdomen is soft.     Tenderness: There is abdominal tenderness (mild RLQ).  Musculoskeletal:        General: No tenderness. Normal range of motion.     Cervical back: Normal range of motion and neck supple.  Skin:    General: Skin is warm and dry.  Neurological:     Mental Status: She is alert and oriented to person, place, and time.     Cranial Nerves: No cranial nerve deficit.     Deep Tendon Reflexes: Reflexes are normal and symmetric.  Psychiatric:        Behavior: Behavior normal.        Thought Content: Thought content normal.        Judgment: Judgment normal.     BP 132/68   Pulse 66   Temp 97.9 F (36.6 C) (Oral)   Ht 5\' 3"  (1.6 m)   Wt 186 lb 8 oz (84.6 kg)   BMI 33.04 kg/m       Assessment & Plan:  Danielle Parrish comes in today with chief complaint of Abdominal Pain   Diagnosis and orders addressed:  1. Right lower quadrant abdominal pain Given pain will do x-ray and lab work, if negative will do CT scan Rest Force fluids Urine negative  for infection, culture pending - Urinalysis, Routine w reflex microscopic - CBC with Differential/Platelet - DG Abd 1 View; Future - Urine Culture Labs pending  Evelina Dun, FNP

## 2021-06-16 LAB — URINE CULTURE

## 2021-06-20 ENCOUNTER — Encounter: Payer: Self-pay | Admitting: Family Medicine

## 2021-06-20 ENCOUNTER — Ambulatory Visit (INDEPENDENT_AMBULATORY_CARE_PROVIDER_SITE_OTHER): Payer: Medicare Other | Admitting: Family Medicine

## 2021-06-20 ENCOUNTER — Other Ambulatory Visit: Payer: Self-pay

## 2021-06-20 VITALS — BP 139/74 | HR 84 | Ht 63.0 in | Wt 187.0 lb

## 2021-06-20 DIAGNOSIS — K567 Ileus, unspecified: Secondary | ICD-10-CM | POA: Diagnosis not present

## 2021-06-20 DIAGNOSIS — R140 Abdominal distension (gaseous): Secondary | ICD-10-CM | POA: Diagnosis not present

## 2021-06-20 DIAGNOSIS — R1031 Right lower quadrant pain: Secondary | ICD-10-CM

## 2021-06-20 NOTE — Progress Notes (Signed)
BP 139/74   Pulse 84   Ht 5\' 3"  (1.6 m)   Wt 187 lb (84.8 kg)   SpO2 (!) 89%   BMI 33.13 kg/m    Subjective:   Patient ID: Danielle Parrish, female    DOB: Mar 12, 1941, 80 y.o.   MRN: [id number]  HPI: Danielle Parrish is a 80 y.o. female presenting on 06/20/2021 for Abdominal Pain (RLQ and radiates to right lower back)   HPI Patient is coming in complaining of abdominal pain in the central right lower quadrant that radiates to her right back that has been going on for couple weeks now.  She was seen and had an x-ray which showed a possible ileus last week.  She says she is passing bowel movements most days of the week and thinks they are normal for her but she does feel quite distended and the pain hurts whenever she moves around.  She denies any fevers or chills or blood in her stool.  She has had a little bit of nausea off and on but not frequently.  She denies any urinary burning or pain.  She is says the pain is not getting any worse.  Pain is worse when she lays flat on her back on the other side.  It gets slightly better when she lays on that side.  Relevant past medical, surgical, family and social history reviewed and updated as indicated. Interim medical history since our last visit reviewed. Allergies and medications reviewed and updated.  Review of Systems  Constitutional:  Negative for chills and fever.  Eyes:  Negative for visual disturbance.  Respiratory:  Negative for chest tightness and shortness of breath.   Cardiovascular:  Negative for chest pain and leg swelling.  Gastrointestinal:  Positive for abdominal distention, abdominal pain and nausea. Negative for blood in stool, constipation, diarrhea and vomiting.  Genitourinary:  Negative for difficulty urinating, dysuria and flank pain.  Musculoskeletal:  Positive for back pain.  Skin:  Negative for rash.  Neurological:  Negative for light-headedness and headaches.  Psychiatric/Behavioral:  Negative for agitation  and behavioral problems.   All other systems reviewed and are negative.  Per HPI unless specifically indicated above   Allergies as of 06/20/2021       Reactions   Crestor [rosuvastatin] Other (See Comments)   Body aches.    Nsaids    Burns stomach.    Vytorin [ezetimibe-simvastatin]    Body aches.    Aspirin Nausea Only   Tolmetin Nausea Only   Burns stomach.         Medication List        Accurate as of June 20, 2021  3:24 PM. If you have any questions, ask your nurse or doctor.          acetaminophen 500 MG tablet Commonly known as: TYLENOL Take 1 tablet (500 mg total) by mouth every 6 (six) hours as needed.   albuterol 108 (90 Base) MCG/ACT inhaler Commonly known as: VENTOLIN HFA TAKE 2 PUFFS BY MOUTH EVERY 6 HOURS AS NEEDED FOR WHEEZE OR SHORTNESS OF BREATH   amLODipine 10 MG tablet Commonly known as: NORVASC Take 1 tablet (10 mg total) by mouth daily.   calcium-vitamin D 500-200 MG-UNIT Tabs tablet Commonly known as: OSCAL WITH D TAKE 1 TABLET BY MOUTH EVERY DAY WITH BREAKFAST   cyclobenzaprine 5 MG tablet Commonly known as: FLEXERIL Take 1 tablet (5 mg total) by mouth 3 (three) times daily as needed for muscle spasms.  diclofenac Sodium 1 % Gel Commonly known as: VOLTAREN APPLY 4 GRAMS TOPICALLY 4 (FOUR) TIMES DAILY.   diphenhydrAMINE 25 MG tablet Commonly known as: BENADRYL Take 50 mg by mouth at bedtime as needed for allergies.   gabapentin 100 MG capsule Commonly known as: NEURONTIN Take 2 capsules (200 mg total) by mouth 3 (three) times daily.   ketoconazole 2 % cream Commonly known as: NIZORAL Apply to affected area everyday   levothyroxine 100 MCG tablet Commonly known as: SYNTHROID Take 1 tablet (100 mcg total) by mouth daily before breakfast.   lidocaine 5 % Commonly known as: Lidoderm Place 1 patch onto the skin daily. Remove & Discard patch within 12 hours or as directed by MD   lisinopril 20 MG tablet Commonly known as:  ZESTRIL Take 1 tablet (20 mg total) by mouth daily.   lovastatin 40 MG tablet Commonly known as: MEVACOR TAKE 1 TABLET BY MOUTH EVERYDAY AT BEDTIME   mirabegron ER 25 MG Tb24 tablet Commonly known as: Myrbetriq Take 1 tablet (25 mg total) by mouth daily.   Multi-Vitamin tablet Take by mouth.   nitrofurantoin (macrocrystal-monohydrate) 100 MG capsule Commonly known as: MACROBID Take 100 mg by mouth daily.   nystatin powder Commonly known as: MYCOSTATIN/NYSTOP Apply topically 4 (four) times daily.   nystatin cream Commonly known as: MYCOSTATIN Apply 1 application topically 2 (two) times daily.   omeprazole 20 MG capsule Commonly known as: PRILOSEC Take 1 capsule (20 mg total) by mouth 2 (two) times daily before a meal.   PRESERVISION AREDS 2 PO Take by mouth.   denosumab 60 MG/ML Sosy injection Commonly known as: PROLIA Bring to your doctor to inject 60 mg subcutaneously.   Prolia 60 MG/ML Sosy injection Generic drug: denosumab TO BE ADMINISTERED IN PHYSICIAN'S OFFICE. INJECT ONE SYRINGE SUBCOUSLY ONCE EVERY 6 MONTHS. REFRIGERATE. USE WITHIN 14 DAYS ONCE AT ROOM TEMPERATURE.   pyridOXINE 100 MG tablet Commonly known as: VITAMIN B-6 Take 100 mg by mouth daily.   sertraline 100 MG tablet Commonly known as: ZOLOFT Take 2 tablets (200 mg total) by mouth daily.   sodium chloride 0.65 % Soln nasal spray Commonly known as: OCEAN Place 1 spray into both nostrils as needed for congestion.   VITAMIN D-3 PO Take 1 capsule by mouth daily.         Objective:   BP 139/74   Pulse 84   Ht 5\' 3"  (1.6 m)   Wt 187 lb (84.8 kg)   SpO2 (!) 89%   BMI 33.13 kg/m   Wt Readings from Last 3 Encounters:  06/20/21 187 lb (84.8 kg)  06/14/21 186 lb 8 oz (84.6 kg)  06/07/21 186 lb 12.8 oz (84.7 kg)    Physical Exam Vitals and nursing note reviewed.  Constitutional:      General: She is not in acute distress.    Appearance: She is well-developed. She is not diaphoretic.   Eyes:     Conjunctiva/sclera: Conjunctivae normal.  Cardiovascular:     Rate and Rhythm: Normal rate and regular rhythm.     Heart sounds: Normal heart sounds. No murmur heard. Pulmonary:     Effort: Pulmonary effort is normal. No respiratory distress.     Breath sounds: Normal breath sounds. No wheezing.  Abdominal:     General: There is distension.     Palpations: Abdomen is soft.     Tenderness: There is abdominal tenderness in the right upper quadrant, right lower quadrant, epigastric area and periumbilical area.  There is no right CVA tenderness, left CVA tenderness, guarding or rebound. Negative signs include Murphy's sign.  Musculoskeletal:        General: No tenderness. Normal range of motion.     Lumbar back: No swelling, deformity, tenderness or bony tenderness. Negative right straight leg raise test and negative left straight leg raise test.  Skin:    General: Skin is warm and dry.     Findings: No rash.  Neurological:     Mental Status: She is alert and oriented to person, place, and time.     Coordination: Coordination normal.  Psychiatric:        Behavior: Behavior normal.      Assessment & Plan:   Problem List Items Addressed This Visit   None Visit Diagnoses     Abdominal distention    -  Primary   Right lower quadrant abdominal pain       Ileus of unspecified type ([place])       Relevant Orders   CT Abdomen Pelvis W Contrast     Patient is quite distended and having more abdominal pain.  She has had some bowel movements but maybe not completely emptying.  The pain goes straight through to her back, will order CT abdomen pelvis with contrast.  CT scan was approved for tomorrow morning    Follow up plan: Return if symptoms worsen or fail to improve.  Counseling provided for all of the vaccine components No orders of the defined types were placed in this encounter.   Caryl Pina, MD [facility] 06/20/2021, 3:24 PM

## 2021-06-21 ENCOUNTER — Ambulatory Visit: Payer: Medicare Other

## 2021-06-21 ENCOUNTER — Ambulatory Visit (HOSPITAL_COMMUNITY)
Admission: RE | Admit: 2021-06-21 | Discharge: 2021-06-21 | Disposition: A | Discharge status: Home / Self Care | Payer: Medicare Other | Source: Ambulatory Visit | Attending: Family Medicine | Admitting: Family Medicine

## 2021-06-21 DIAGNOSIS — K567 Ileus, unspecified: Secondary | ICD-10-CM | POA: Insufficient documentation

## 2021-06-21 DIAGNOSIS — R109 Unspecified abdominal pain: Secondary | ICD-10-CM | POA: Diagnosis not present

## 2021-06-21 MED ORDER — IOHEXOL 300 MG/ML  SOLN
100.0000 mL | Freq: Once | INTRAMUSCULAR | Status: AC | PRN
  Administered 2021-06-21: 100 mL via INTRAVENOUS

## 2021-06-21 MED ORDER — IOHEXOL 9 MG/ML PO SOLN
ORAL | Status: AC
  Filled 2021-06-21: qty 1000

## 2021-06-21 MED ORDER — IOHEXOL 9 MG/ML PO SOLN: 500.0000 mL | ORAL | Status: AC

## 2021-06-23 ENCOUNTER — Telehealth: Payer: Self-pay | Admitting: Pharmacist

## 2021-06-23 ENCOUNTER — Ambulatory Visit: Payer: Medicare Other | Admitting: Physical Therapy

## 2021-06-23 ENCOUNTER — Ambulatory Visit: Payer: Medicare Other | Admitting: Pharmacist

## 2021-06-23 NOTE — Telephone Encounter (Signed)
Call placed to dr. Bartolo Darter office in [place] (dental works) [phone] to ask about patient's dental extractions--awaiting return call; Dr. has refused surgery due to patient on Prolia Hold prolia until we hear back from Dr. Brandt Loosen.  Encouraged patient to call as well Will she do extractions if we hold prolia? If so, how long do we hold?

## 2021-06-29 NOTE — Telephone Encounter (Signed)
Dr. Bartolo Darter office returned call They are unable to perform extraction at any point due to bisphosphonate/prolia use

## 2021-07-01 ENCOUNTER — Telehealth: Payer: Self-pay | Admitting: Family Medicine

## 2021-07-01 NOTE — Telephone Encounter (Signed)
Pt called stating that she is still having severe side and back pain and doesn't know what to do. Says she had a CT scan done recently and it didn't show anything but says something is wrong, she just doesn't know what it is.  Needs advise. Please call patient at [phone]

## 2021-07-01 NOTE — Telephone Encounter (Signed)
Tried calling pt. No answer no machine.

## 2021-07-01 NOTE — Telephone Encounter (Signed)
Pt's CT was normal. Urine culture did grow bacteria and pt has completed tx.   Still c/o abdominal pain that radiates to her back. She is very tired even though she is sleeping at night.  Pt denies UTI symptoms, confusion, disoriented. Pt denies constipation

## 2021-07-01 NOTE — Telephone Encounter (Signed)
I have more concern at this point this possibly may be related to spinal or back issues rather than abdominal issues because we have already looked at the abdomen significantly.  She should probably go see her back doctor or get evaluated by an orthopedic.

## 2021-07-06 ENCOUNTER — Ambulatory Visit (INDEPENDENT_AMBULATORY_CARE_PROVIDER_SITE_OTHER): Payer: Medicare Other | Admitting: Nurse Practitioner

## 2021-07-06 ENCOUNTER — Encounter: Payer: Self-pay | Admitting: Nurse Practitioner

## 2021-07-06 DIAGNOSIS — R059 Cough, unspecified: Secondary | ICD-10-CM | POA: Diagnosis not present

## 2021-07-06 DIAGNOSIS — U071 COVID-19: Secondary | ICD-10-CM | POA: Diagnosis not present

## 2021-07-06 MED ORDER — MOLNUPIRAVIR EUA 200MG CAPSULE: 4.0000 | ORAL_CAPSULE | Freq: Two times a day (BID) | ORAL | 0 refills | Status: AC

## 2021-07-06 NOTE — Assessment & Plan Note (Signed)
Symptoms not well controlled with cough, congestion, fever, nausea and body ache.  Patient tested positive for COVID-19 using an at home testing kit.  Provided education to the patient, increase hydration, molnupiravir RX sent to pharmacy education provided.

## 2021-07-06 NOTE — Progress Notes (Signed)
   Virtual Visit  Note Due to COVID-19 pandemic this visit was conducted virtually. This visit type was conducted due to national recommendations for restrictions regarding the COVID-19 Pandemic (e.g. social distancing, sheltering in place) in an effort to limit this patient's exposure and mitigate transmission in our community. All issues noted in this document were discussed and addressed.  A physical exam was not performed with this format.  I connected with Danielle Parrish on 07/06/21 at 11:20 AM by telephone and verified that I am speaking with the correct person using two identifiers. Danielle Parrish is currently located at home during visit. The provider, Ivy Lynn, NP is located in their office at time of visit.  I discussed the limitations, risks, security and privacy concerns of performing an evaluation and management service by telephone and the availability of in person appointments. I also discussed with the patient that there may be a patient responsible charge related to this service. The patient expressed understanding and agreed to proceed.   History and Present Illness:  Cough This is a new problem. Episode onset: 24 to 36 hours. The problem has been gradually worsening. The problem occurs constantly. The cough is Productive of sputum. Associated symptoms include chills, a fever and nasal congestion. Pertinent negatives include no chest pain, sore throat or shortness of breath. Nothing aggravates the symptoms. Risk factors: Positive COVID-19. She has tried nothing for the symptoms.     Review of Systems  Constitutional:  Positive for chills and fever.  HENT:  Negative for sore throat.   Respiratory:  Positive for cough. Negative for shortness of breath.   Cardiovascular:  Negative for chest pain.    Observations/Objective: Televisit patient did not sound to be in distress.  Assessment and Plan: Symptoms not well controlled with cough, congestion, fever, nausea  and body ache.  Patient tested positive for COVID-19 using an at home testing kit.  Provided education to the patient, increase hydration, molnupiravir RX sent to pharmacy education provided.  Follow Up Instructions:   Follow-up with worsening unresolved symptoms.  I discussed the assessment and treatment plan with the patient. The patient was provided an opportunity to ask questions and all were answered. The patient agreed with the plan and demonstrated an understanding of the instructions.   The patient was advised to call back or seek an in-person evaluation if the symptoms worsen or if the condition fails to improve as anticipated.  The above assessment and management plan was discussed with the patient. The patient verbalized understanding of and has agreed to the management plan. Patient is aware to call the clinic if symptoms persist or worsen. Patient is aware when to return to the clinic for a follow-up visit. Patient educated on when it is appropriate to go to the emergency department.   Time call ended: 11:30 AM  I provided 10 minutes of  non face-to-face time during this encounter.    Ivy Lynn, NP

## 2021-07-07 ENCOUNTER — Telehealth: Payer: Self-pay | Admitting: Family Medicine

## 2021-07-07 NOTE — Telephone Encounter (Signed)
Informed pt that I tried to contact her but there was no answer or voicemail.  Pt has been informed of Dr. Merita Norton recommendations. She does not want to see the orthopedic she would like to back to her back surgeon. Pt does not remember the providers name. She will contact our office back with the information or when she finds the name she will contact on her own. Pt does not feel well. Recently diagnosed with Covid.  Pt would wanted confirmation on when her appt was for sleep apnea. Confirmed that it is 8/16 at 2:30 with Amy Lomax.

## 2021-07-12 ENCOUNTER — Other Ambulatory Visit: Payer: Self-pay | Admitting: Family Medicine

## 2021-07-12 ENCOUNTER — Encounter: Payer: Self-pay | Admitting: Nurse Practitioner

## 2021-07-12 ENCOUNTER — Ambulatory Visit (INDEPENDENT_AMBULATORY_CARE_PROVIDER_SITE_OTHER): Payer: Medicare Other | Admitting: Nurse Practitioner

## 2021-07-12 DIAGNOSIS — J069 Acute upper respiratory infection, unspecified: Secondary | ICD-10-CM | POA: Diagnosis not present

## 2021-07-12 DIAGNOSIS — G8929 Other chronic pain: Secondary | ICD-10-CM

## 2021-07-12 MED ORDER — BENZONATATE 100 MG PO CAPS
100.0000 mg | ORAL_CAPSULE | Freq: Three times a day (TID) | ORAL | 0 refills | Status: DC | PRN
Start: 2021-07-12 — End: 2021-07-29

## 2021-07-12 MED ORDER — DM-GUAIFENESIN ER 30-600 MG PO TB12: 1.0000 | ORAL_TABLET | Freq: Two times a day (BID) | ORAL | 0 refills | Status: DC

## 2021-07-12 NOTE — Telephone Encounter (Signed)
Pt called to let Je know that the medicine that was called in for her that is OTC is not covered by her insurance. Needs Je to call her in some medicine that is covered.  Please advise and call patient.

## 2021-07-12 NOTE — Progress Notes (Signed)
   Virtual Visit  Note Due to COVID-19 pandemic this visit was conducted virtually. This visit type was conducted due to national recommendations for restrictions regarding the COVID-19 Pandemic (e.g. social distancing, sheltering in place) in an effort to limit this patient's exposure and mitigate transmission in our community. All issues noted in this document were discussed and addressed.  A physical exam was not performed with this format.  I connected with Danielle Parrish on 07/12/21 at 10: 40 am by telephone and verified that I am speaking with the correct person using two identifiers. Danielle Parrish is currently located at home during visit. The provider, Ivy Lynn, NP is located in their office at time of visit.  I discussed the limitations, risks, security and privacy concerns of performing an evaluation and management service by telephone and the availability of in person appointments. I also discussed with the patient that there may be a patient responsible charge related to this service. The patient expressed understanding and agreed to proceed.   History and Present Illness:  URI  This is a recurrent problem. The current episode started in the past 7 days. The problem has been unchanged. There has been no fever. Associated symptoms include congestion and coughing. Pertinent negatives include no abdominal pain, headaches, nausea or rash. Treatments tried: Recently treated for COVID-19. The treatment provided mild relief.     Review of Systems  Constitutional:  Negative for chills and fever.  HENT:  Positive for congestion.   Respiratory:  Positive for cough.   Gastrointestinal:  Negative for abdominal pain and nausea.  Skin:  Negative for rash.  Neurological:  Negative for headaches.  All other systems reviewed and are negative.   Observations/Objective: Televisit patient not in distress.  Assessment and Plan: Patient was recently treated for COVID-19 and  completed medication as prescribed but is now experiencing worsening cough and congestion.  Encourage increase hydration, benzonatate for cough, guaifenesin for cough and congestion,   Follow Up Instructions: Follow-up with worsening unresolved symptoms.    I discussed the assessment and treatment plan with the patient. The patient was provided an opportunity to ask questions and all were answered. The patient agreed with the plan and demonstrated an understanding of the instructions.   The patient was advised to call back or seek an in-person evaluation if the symptoms worsen or if the condition fails to improve as anticipated.  The above assessment and management plan was discussed with the patient. The patient verbalized understanding of and has agreed to the management plan. Patient is aware to call the clinic if symptoms persist or worsen. Patient is aware when to return to the clinic for a follow-up visit. Patient educated on when it is appropriate to go to the emergency department.   Time call ended: 10:50 AM  I provided 10 minutes of  non face-to-face time during this encounter.    Ivy Lynn, NP

## 2021-07-12 NOTE — Telephone Encounter (Signed)
Mucinex not covered by insurance, please advise on alternative  RF request for Lidocaine 5%. Last RF 05/26/21

## 2021-07-14 NOTE — Telephone Encounter (Signed)
Pt got OTC Mucinex, she is feeling some better.  She asked about being referred back to the doctor she saw back in 2019 for her back, in looking this up she saw Dr. Nelva Bush. Please advise on this.

## 2021-07-15 NOTE — Telephone Encounter (Signed)
Still unable to reach patient, encounter closed,  patient never called back.

## 2021-07-18 ENCOUNTER — Telehealth: Payer: Self-pay | Admitting: Neurology

## 2021-07-18 ENCOUNTER — Telehealth: Payer: Self-pay | Admitting: *Deleted

## 2021-07-18 DIAGNOSIS — G4733 Obstructive sleep apnea (adult) (pediatric): Secondary | ICD-10-CM

## 2021-07-18 NOTE — Addendum Note (Signed)
Addended by: Debbora Presto L on: 07/18/2021 04:16 PM   Modules accepted: Orders

## 2021-07-18 NOTE — Telephone Encounter (Signed)
Cpap supplies order signed by Amy NP then faxed to [facility] at [phone]. Received a receipt of confirmation.

## 2021-07-18 NOTE — Telephone Encounter (Signed)
Pt called and LVM stating that she just got over Covid and is wanting to get new Cpap supplies so that she is not using the same supplies she was using while she had Covid. [facility] informed her that if a prescription was written for her for one time then after she saw the provider then she can get another prescription. Please advise.

## 2021-07-18 NOTE — Telephone Encounter (Signed)
I tried to call patient to schedule prolia injection. Prolia is in refrigerator. Patient may schedule at anytime.

## 2021-07-18 NOTE — Telephone Encounter (Signed)
Order printed for NP signature then will fax to [facility]. Patient has been updated via telephone. She states she has COVID x 3 weeks and has now tested negative. Symptoms consisted of rhinorrhea and productive cough but no fever. Pt mentioned she may get another booster shot. Pt will be in touch with [facility] regarding order for supplies.

## 2021-07-20 ENCOUNTER — Telehealth: Payer: Self-pay | Admitting: Family Medicine

## 2021-07-21 NOTE — Telephone Encounter (Signed)
No answer, no voicemail.  Per telephone encounter with Pricilla Riffle on 07/07/21, patient did not want to be referred to ortho, she wanted to see her back surgeon and was supposed to find out who that was and call us back with this information.

## 2021-07-27 NOTE — Telephone Encounter (Signed)
To see a spinal surgeon rather than an orthopedic we would need an MRI, to order an MRI she would have to come back in for a visit and be reevaluated after physical therapy and 6 weeks of past which it does appear she has done both.,  Please make an appointment for

## 2021-07-28 DIAGNOSIS — G4733 Obstructive sleep apnea (adult) (pediatric): Secondary | ICD-10-CM | POA: Diagnosis not present

## 2021-07-28 NOTE — Telephone Encounter (Signed)
Tried calling. No answer and no vmail

## 2021-07-29 ENCOUNTER — Other Ambulatory Visit: Payer: Self-pay

## 2021-07-29 ENCOUNTER — Ambulatory Visit (INDEPENDENT_AMBULATORY_CARE_PROVIDER_SITE_OTHER): Payer: Medicare Other | Admitting: Family Medicine

## 2021-07-29 ENCOUNTER — Encounter: Payer: Self-pay | Admitting: Family Medicine

## 2021-07-29 VITALS — BP 115/69 | HR 81 | Temp 97.0°F | Ht 63.0 in | Wt 183.4 lb

## 2021-07-29 DIAGNOSIS — R682 Dry mouth, unspecified: Secondary | ICD-10-CM

## 2021-07-29 DIAGNOSIS — G8929 Other chronic pain: Secondary | ICD-10-CM

## 2021-07-29 DIAGNOSIS — M2550 Pain in unspecified joint: Secondary | ICD-10-CM | POA: Diagnosis not present

## 2021-07-29 DIAGNOSIS — M545 Low back pain, unspecified: Secondary | ICD-10-CM

## 2021-07-29 DIAGNOSIS — R5382 Chronic fatigue, unspecified: Secondary | ICD-10-CM | POA: Diagnosis not present

## 2021-07-29 DIAGNOSIS — R6889 Other general symptoms and signs: Secondary | ICD-10-CM | POA: Diagnosis not present

## 2021-07-29 NOTE — Progress Notes (Signed)
Subjective:  Patient ID: Danielle Parrish, female    DOB: 12/05/1941, 80 y.o.   MRN: [id number]  Patient Care Team: Dettinger, Fransisca Kaufmann, MD as PCP - General (Family Medicine) Ilean China, RN as Case Manager Sandford Craze, MD as Referring Physician (Dermatology)   Chief Complaint:  Flank Pain and [place]le Michaels [place] this morning standing up at the counter /)   HPI: Danielle Parrish is a 80 y.o. female presenting on 07/29/2021 for Flank Pain and Fall Golden Circle this morning standing up at the counter /)   Pt presents today for ongoing lower back pain that radiates to right flank. She states the pain was so severe that she fell to the floor this morning. Denies injury during fall. She states the pain is worse with certain movements and when trying to sleep. She denies urinary symptoms and reports regular bowel movements. She states she is tired all of the time and her joints and muscles ache on a daily basis. She states over the last several months she has noticed her eyes and mouth are very dry and bothersome. She denies pain or injury to either. She has recently restarted her CPAP therapy.    Relevant past medical, surgical, family, and social history reviewed and updated as indicated.  Allergies and medications reviewed and updated. Date reviewed: Chart in Epic.   Past Medical History:  Diagnosis Date   Anxiety    Arthritis    CHF (congestive heart failure) (HCC)    Chronic pain of multiple joints    Depression    Essential hypertension    GERD (gastroesophageal reflux disease)    Hyperlipidemia    Hyperlipidemia associated with type 2 diabetes mellitus ([place])    Hypertension associated with type 2 diabetes mellitus ([place])    Hypothyroidism    Osteoporosis    Sleep apnea    CPAP   Type 2 diabetes mellitus with hypercholesterolemia ([place])    Vitamin D deficiency     Past Surgical History:  Procedure Laterality Date   ABDOMINAL HERNIA REPAIR     ABDOMINAL HYSTERECTOMY  1977    BREAST EXCISIONAL BIOPSY Right    EYE SURGERY  2020   bilateral cataract removal    KNEE ARTHROSCOPY Left 09/30/2014   Procedure: LEFT KNEE ARTHROSCOPY MEDIAL MENISECTOMY ABRASION CHONDROPLASTY SYNOVECTOMY SUPRAPATELLER CUFF;  Surgeon: Tobi Bastos, MD;  Location: [facility];  Service: Orthopedics;  Laterality: Left;    Social History   Socioeconomic History   Marital status: Widowed    Spouse name: Laverna Peace   Number of children: 2   Years of education: Not on file   Highest education level: High school graduate  Occupational History   Occupation: CNA    Comment: [employer]   Occupation: retired  Tobacco Use   Smoking status: Never   Smokeless tobacco: Never  Scientific laboratory technician Use: Never used  Substance and Sexual Activity   Alcohol use: No   Drug use: No   Sexual activity: Not Currently  Other Topics Concern   Not on file  Social History Narrative   Not on file   Social Determinants of Health   Financial Resource Strain: Not on file  Food Insecurity: Not on file  Transportation Needs: Not on file  Physical Activity: Not on file  Stress: Not on file  Social Connections: Not on file  Intimate Partner Violence: Not on file    Outpatient Encounter Medications as of 07/29/2021  Medication Sig  acetaminophen (TYLENOL) 500 MG tablet Take 1 tablet (500 mg total) by mouth every 6 (six) hours as needed.   albuterol (VENTOLIN HFA) 108 (90 Base) MCG/ACT inhaler TAKE 2 PUFFS BY MOUTH EVERY 6 HOURS AS NEEDED FOR WHEEZE OR SHORTNESS OF BREATH   amLODipine (NORVASC) 10 MG tablet Take 1 tablet (10 mg total) by mouth daily.   calcium-vitamin D (OSCAL WITH D) 500-200 MG-UNIT TABS tablet TAKE 1 TABLET BY MOUTH EVERY DAY WITH BREAKFAST   Cholecalciferol (VITAMIN D-3 PO) Take 1 capsule by mouth daily.    cyclobenzaprine (FLEXERIL) 5 MG tablet Take 1 tablet (5 mg total) by mouth 3 (three) times daily as needed for muscle spasms.   denosumab (PROLIA) 60 MG/ML SOSY injection Bring to  your doctor to inject 60 mg subcutaneously.   diclofenac Sodium (VOLTAREN) 1 % GEL APPLY 4 GRAMS TOPICALLY 4 (FOUR) TIMES DAILY.   diphenhydrAMINE (BENADRYL) 25 MG tablet Take 50 mg by mouth at bedtime as needed for allergies.    ketoconazole (NIZORAL) 2 % cream Apply to affected area everyday   levothyroxine (SYNTHROID) 100 MCG tablet Take 1 tablet (100 mcg total) by mouth daily before breakfast.   lidocaine (LIDODERM) 5 % PLACE 1 PATCH ONTO THE SKIN DAILY. REMOVE & DISCARD PATCH WITHIN 12 HOURS OR AS DIRECTED BY MD   lisinopril (ZESTRIL) 20 MG tablet Take 1 tablet (20 mg total) by mouth daily.   lovastatin (MEVACOR) 40 MG tablet TAKE 1 TABLET BY MOUTH EVERYDAY AT BEDTIME   mirabegron ER (MYRBETRIQ) 25 MG TB24 tablet Take 1 tablet (25 mg total) by mouth daily.   Multiple Vitamin (MULTI-VITAMIN) tablet Take by mouth.   Multiple Vitamins-Minerals (PRESERVISION AREDS 2 PO) Take by mouth.   nitrofurantoin, macrocrystal-monohydrate, (MACROBID) 100 MG capsule Take 100 mg by mouth daily.   nystatin (MYCOSTATIN/NYSTOP) powder Apply topically 4 (four) times daily.   nystatin cream (MYCOSTATIN) Apply 1 application topically 2 (two) times daily.   omeprazole (PRILOSEC) 20 MG capsule Take 1 capsule (20 mg total) by mouth 2 (two) times daily before a meal.   PROLIA 60 MG/ML SOSY injection TO BE ADMINISTERED IN PHYSICIAN'S OFFICE. INJECT ONE SYRINGE SUBCOUSLY ONCE EVERY 6 MONTHS. REFRIGERATE. USE WITHIN 14 DAYS ONCE AT ROOM TEMPERATURE.   pyridOXINE (VITAMIN B-6) 100 MG tablet Take 100 mg by mouth daily.   sertraline (ZOLOFT) 100 MG tablet Take 2 tablets (200 mg total) by mouth daily.   sodium chloride (OCEAN) 0.65 % SOLN nasal spray Place 1 spray into both nostrils as needed for congestion.   gabapentin (NEURONTIN) 100 MG capsule Take 2 capsules (200 mg total) by mouth 3 (three) times daily.   [DISCONTINUED] benzonatate (TESSALON PERLES) 100 MG capsule Take 1 capsule (100 mg total) by mouth 3 (three) times  daily as needed for cough.   [DISCONTINUED] dextromethorphan-guaiFENesin (MUCINEX DM) 30-600 MG 12hr tablet Take 1 tablet by mouth 2 (two) times daily.   No facility-administered encounter medications on file as of 07/29/2021.    Allergies  Allergen Reactions   Crestor [Rosuvastatin] Other (See Comments)    Body aches.    Nsaids     Burns stomach.    Vytorin [Ezetimibe-Simvastatin]     Body aches.    Aspirin Nausea Only   Tolmetin Nausea Only    Burns stomach.     Review of Systems  HENT:         Dry mouth  Eyes:  Negative for photophobia, pain, discharge, redness, itching and visual disturbance.  Dry eyes  All other systems reviewed and are negative.      Objective:  BP 115/69   Pulse 81   Temp (!) 97 F (36.1 C) (Temporal)   Ht '5\' 3"'$  (1.6 m)   Wt 183 lb 6.4 oz (83.2 kg)   SpO2 92%   BMI 32.49 kg/m    Wt Readings from Last 3 Encounters:  07/29/21 183 lb 6.4 oz (83.2 kg)  06/20/21 187 lb (84.8 kg)  06/14/21 186 lb 8 oz (84.6 kg)    Physical Exam Vitals and nursing note reviewed.  Constitutional:      General: She is not in acute distress.    Appearance: Normal appearance. She is well-developed and well-groomed. She is obese. She is not ill-appearing, toxic-appearing or diaphoretic.  HENT:     Head: Normocephalic and atraumatic.     Jaw: There is normal jaw occlusion.     Right Ear: Hearing normal.     Left Ear: Hearing normal.     Nose: Nose normal.     Mouth/Throat:     Lips: Pink.     Mouth: Mucous membranes are dry. No injury, lacerations, oral lesions or angioedema.     Tongue: No lesions. Tongue does not deviate from midline.     Palate: No mass and lesions.     Pharynx: Oropharynx is clear. Uvula midline.  Eyes:     General: Lids are normal.     Extraocular Movements: Extraocular movements intact.     Conjunctiva/sclera: Conjunctivae normal.     Pupils: Pupils are equal, round, and reactive to light.  Neck:     Thyroid: No thyroid mass,  thyromegaly or thyroid tenderness.     Vascular: No carotid bruit or JVD.     Trachea: Trachea and phonation normal.  Cardiovascular:     Rate and Rhythm: Normal rate and regular rhythm.     Chest Wall: PMI is not displaced.     Pulses: Normal pulses.     Heart sounds: Normal heart sounds. No murmur heard.   No friction rub. No gallop.  Pulmonary:     Effort: Pulmonary effort is normal. No respiratory distress.     Breath sounds: Normal breath sounds. No wheezing.  Abdominal:     General: Bowel sounds are normal. There is no distension or abdominal bruit.     Palpations: Abdomen is soft. There is no hepatomegaly, splenomegaly or mass.     Tenderness: There is no abdominal tenderness. There is no right CVA tenderness, left CVA tenderness, guarding or rebound.     Hernia: No hernia is present.  Musculoskeletal:     Cervical back: Normal, normal range of motion and neck supple.     Thoracic back: Normal.     Lumbar back: Tenderness and bony tenderness present. No swelling, edema, deformity, signs of trauma, lacerations or spasms. Decreased range of motion (due to pain). Negative right straight leg raise test and negative left straight leg raise test. No scoliosis.       Back:     Right lower leg: No edema.     Left lower leg: No edema.  Lymphadenopathy:     Cervical: No cervical adenopathy.  Skin:    General: Skin is warm and dry.     Capillary Refill: Capillary refill takes less than 2 seconds.     Coloration: Skin is not cyanotic, jaundiced or pale.     Findings: No rash.  Neurological:     General: No focal  deficit present.     Mental Status: She is alert and oriented to person, place, and time.     Cranial Nerves: Cranial nerves are intact. No cranial nerve deficit.     Sensory: Sensation is intact. No sensory deficit.     Motor: Motor function is intact. No weakness.     Coordination: Coordination is intact. Coordination normal.     Gait: Gait abnormal (antalgic).     Deep  Tendon Reflexes: Reflexes are normal and symmetric. Reflexes normal.  Psychiatric:        Attention and Perception: Attention and perception normal.        Mood and Affect: Mood and affect normal.        Speech: Speech normal.        Behavior: Behavior normal. Behavior is cooperative.        Thought Content: Thought content normal.        Cognition and Memory: Cognition and memory normal.        Judgment: Judgment normal.    Results for orders placed or performed in visit on 06/14/21  Urine Culture   Specimen: Urine   UR  Result Value Ref Range   Urine Culture, Routine Final report    Organism ID, Bacteria Comment   Microscopic Examination   Urine  Result Value Ref Range   WBC, UA 0-5 0 - 5 /hpf   RBC None seen 0 - 2 /hpf   Epithelial Cells (non renal) 0-10 0 - 10 /hpf   Bacteria, UA Few None seen/Few  Urinalysis, Routine w reflex microscopic  Result Value Ref Range   Specific Gravity, UA 1.015 1.005 - 1.030   pH, UA 5.0 5.0 - 7.5   Color, UA Yellow Yellow   Appearance Ur Clear Clear   Leukocytes,UA 1+ (A) Negative   Protein,UA Negative Negative/Trace   Glucose, UA Negative Negative   Ketones, UA Negative Negative   RBC, UA Negative Negative   Bilirubin, UA Negative Negative   Urobilinogen, Ur 0.2 0.2 - 1.0 mg/dL   Nitrite, UA Negative Negative   Microscopic Examination See below:   CBC with Differential/Platelet  Result Value Ref Range   WBC 7.7 3.4 - 10.8 x10E3/uL   RBC 4.54 3.77 - 5.28 x10E6/uL   Hemoglobin 13.9 11.1 - 15.9 g/dL   Hematocrit 43.4 34.0 - 46.6 %   MCV 96 79 - 97 fL   MCH 30.6 26.6 - 33.0 pg   MCHC 32.0 31.5 - 35.7 g/dL   RDW 12.1 11.7 - 15.4 %   Platelets 230 150 - 450 x10E3/uL   Neutrophils 57 Not Estab. %   Lymphs 33 Not Estab. %   Monocytes 9 Not Estab. %   Eos 1 Not Estab. %   Basos 0 Not Estab. %   Neutrophils Absolute 4.3 1.4 - 7.0 x10E3/uL   Lymphocytes Absolute 2.5 0.7 - 3.1 x10E3/uL   Monocytes Absolute 0.7 0.1 - 0.9 x10E3/uL   EOS  (ABSOLUTE) 0.1 0.0 - 0.4 x10E3/uL   Basophils Absolute 0.0 0.0 - 0.2 x10E3/uL   Immature Granulocytes 0 Not Estab. %   Immature Grans (Abs) 0.0 0.0 - 0.1 x10E3/uL       Pertinent labs & imaging results that were available during my care of the patient were reviewed by me and considered in my medical decision making.  Assessment & Plan:  Coni was seen today for flank pain and fall.  Diagnoses and all orders for this visit:  Chronic midline low  back pain without sciatica Ongoing. CT abd/pelvis unremarkable. Will obtain MRI lumbar spine. Referral back to Dr Saintclair Halsted.  -     MR Lumbar Spine Wo Contrast; Future -     Ambulatory referral to Neurosurgery  Chronic pain of multiple joints Chronic pain and fatigue. Will check Vit D level. Will also check ANA for possible underlying autoimmune disease.  -     ANA,IFA RA Diag Pnl w/rflx Tit/Patn -     VITAMIN D 25 Hydroxy (Vit-D Deficiency, Fractures)  Dry mouth Ongoing dry mouth and eyes over last several months. Will check for possible Sjogren's with ANA.  -     ANA,IFA RA Diag Pnl w/rflx Tit/Patn  Chronic fatigue Will check below labs. Compliance with CPAP therapy emphasized. Will also check for possible underlying autoimmune disorders with ANA.  -     CBC with Differential/Platelet -     TSH -     VITAMIN D 25 Hydroxy (Vit-D Deficiency, Fractures)     Continue all other maintenance medications.  Follow up plan: Return in about 3 months (around 10/29/2021) for Dr. Warrick Parisian.   Continue healthy lifestyle choices, including diet (rich in fruits, vegetables, and lean proteins, and low in salt and simple carbohydrates) and exercise (at least 30 minutes of moderate physical activity daily).  Educational handout given for chronic back pain  The above assessment and management plan was discussed with the patient. The patient verbalized understanding of and has agreed to the management plan. Patient is aware to call the clinic if they develop  any new symptoms or if symptoms persist or worsen. Patient is aware when to return to the clinic for a follow-up visit. Patient educated on when it is appropriate to go to the emergency department.   Monia Pouch, FNP-C [facility] [phone]

## 2021-08-01 ENCOUNTER — Ambulatory Visit (INDEPENDENT_AMBULATORY_CARE_PROVIDER_SITE_OTHER): Payer: Medicare Other

## 2021-08-01 VITALS — Ht 63.0 in | Wt 183.0 lb

## 2021-08-01 DIAGNOSIS — Z0000 Encounter for general adult medical examination without abnormal findings: Secondary | ICD-10-CM

## 2021-08-01 NOTE — Patient Instructions (Signed)
Danielle Parrish , Thank you for taking time to come for your Medicare Wellness Visit. I appreciate your ongoing commitment to your health goals. Please review the following plan we discussed and let me know if I can assist you in the future.   Screening recommendations/referrals: Colonoscopy: Done 03/21/2016 - repeat not required Mammogram: Done 02/28/2021 - Repeat annually Bone Density: Done 04/21/2021 - Repeat every 2 years Recommended yearly ophthalmology/optometry visit for glaucoma screening and checkup Recommended yearly dental visit for hygiene and checkup  Vaccinations: Influenza vaccine: Done 2021 - Repeat annually  Pneumococcal vaccine: Done 12/26/2013 & 04/08/2015 Tdap vaccine: Done 10/15/2013 - Repeat in 10 years Shingles vaccine: Done 08/06/2019 & 12/02/2019   Covid-19: Done 01/17/20, 02/14/20, & 10/29/20  Advanced directives: Advance directive discussed with you today. Even though you declined this today, please call our office should you change your mind, and we can give you the proper paperwork for you to fill out.   Conditions/risks identified: Aim for 30 minutes of exercise or brisk walking each day, drink 6-8 glasses of water and eat lots of fruits and vegetables.   Next appointment: Follow up in one year for your annual wellness visit    Preventive Care 65 Years and Older, Female Preventive care refers to lifestyle choices and visits with your health care provider that can promote health and wellness. What does preventive care include? A yearly physical exam. This is also called an annual well check. Dental exams once or twice a year. Routine eye exams. Ask your health care provider how often you should have your eyes checked. Personal lifestyle choices, including: Daily care of your teeth and gums. Regular physical activity. Eating a healthy diet. Avoiding tobacco and drug use. Limiting alcohol use. Practicing safe sex. Taking low-dose aspirin every day. Taking vitamin and  mineral supplements as recommended by your health care provider. What happens during an annual well check? The services and screenings done by your health care provider during your annual well check will depend on your age, overall health, lifestyle risk factors, and family history of disease. Counseling  Your health care provider may ask you questions about your: Alcohol use. Tobacco use. Drug use. Emotional well-being. Home and relationship well-being. Sexual activity. Eating habits. History of falls. Memory and ability to understand (cognition). Work and work Statistician. Reproductive health. Screening  You may have the following tests or measurements: Height, weight, and BMI. Blood pressure. Lipid and cholesterol levels. These may be checked every 5 years, or more frequently if you are over 73 years old. Skin check. Lung cancer screening. You may have this screening every year starting at age 29 if you have a 30-pack-year history of smoking and currently smoke or have quit within the past 15 years. Fecal occult blood test (FOBT) of the stool. You may have this test every year starting at age 79. Flexible sigmoidoscopy or colonoscopy. You may have a sigmoidoscopy every 5 years or a colonoscopy every 10 years starting at age 25. Hepatitis C blood test. Hepatitis B blood test. Sexually transmitted disease (STD) testing. Diabetes screening. This is done by checking your blood sugar (glucose) after you have not eaten for a while (fasting). You may have this done every 1-3 years. Bone density scan. This is done to screen for osteoporosis. You may have this done starting at age 67. Mammogram. This may be done every 1-2 years. Talk to your health care provider about how often you should have regular mammograms. Talk with your health care provider  about your test results, treatment options, and if necessary, the need for more tests. Vaccines  Your health care provider may recommend  certain vaccines, such as: Influenza vaccine. This is recommended every year. Tetanus, diphtheria, and acellular pertussis (Tdap, Td) vaccine. You may need a Td booster every 10 years. Zoster vaccine. You may need this after age 72. Pneumococcal 13-valent conjugate (PCV13) vaccine. One dose is recommended after age 61. Pneumococcal polysaccharide (PPSV23) vaccine. One dose is recommended after age 76. Talk to your health care provider about which screenings and vaccines you need and how often you need them. This information is not intended to replace advice given to you by your health care provider. Make sure you discuss any questions you have with your health care provider. Document Released: 01/07/2016 Document Revised: 08/30/2016 Document Reviewed: 10/12/2015 Elsevier Interactive Patient Education  2017 [place] Prevention in the Home Falls can cause injuries. They can happen to people of all ages. There are many things you can do to make your home safe and to help prevent falls. What can I do on the outside of my home? Regularly fix the edges of walkways and driveways and fix any cracks. Remove anything that might make you trip as you walk through a door, such as a raised step or threshold. Trim any bushes or trees on the path to your home. Use bright outdoor lighting. Clear any walking paths of anything that might make someone trip, such as rocks or tools. Regularly check to see if handrails are loose or broken. Make sure that both sides of any steps have handrails. Any raised decks and porches should have guardrails on the edges. Have any leaves, snow, or ice cleared regularly. Use sand or salt on walking paths during winter. Clean up any spills in your garage right away. This includes oil or grease spills. What can I do in the bathroom? Use night lights. Install grab bars by the toilet and in the tub and shower. Do not use towel bars as grab bars. Use non-skid mats or  decals in the tub or shower. If you need to sit down in the shower, use a plastic, non-slip stool. Keep the floor dry. Clean up any water that spills on the floor as soon as it happens. Remove soap buildup in the tub or shower regularly. Attach bath mats securely with double-sided non-slip rug tape. Do not have throw rugs and other things on the floor that can make you trip. What can I do in the bedroom? Use night lights. Make sure that you have a light by your bed that is easy to reach. Do not use any sheets or blankets that are too big for your bed. They should not hang down onto the floor. Have a firm chair that has side arms. You can use this for support while you get dressed. Do not have throw rugs and other things on the floor that can make you trip. What can I do in the kitchen? Clean up any spills right away. Avoid walking on wet floors. Keep items that you use a lot in easy-to-reach places. If you need to reach something above you, use a strong step stool that has a grab bar. Keep electrical cords out of the way. Do not use floor polish or wax that makes floors slippery. If you must use wax, use non-skid floor wax. Do not have throw rugs and other things on the floor that can make you trip. What can I do  with my stairs? Do not leave any items on the stairs. Make sure that there are handrails on both sides of the stairs and use them. Fix handrails that are broken or loose. Make sure that handrails are as long as the stairways. Check any carpeting to make sure that it is firmly attached to the stairs. Fix any carpet that is loose or worn. Avoid having throw rugs at the top or bottom of the stairs. If you do have throw rugs, attach them to the floor with carpet tape. Make sure that you have a light switch at the top of the stairs and the bottom of the stairs. If you do not have them, ask someone to add them for you. What else can I do to help prevent falls? Wear shoes that: Do not  have high heels. Have rubber bottoms. Are comfortable and fit you well. Are closed at the toe. Do not wear sandals. If you use a stepladder: Make sure that it is fully opened. Do not climb a closed stepladder. Make sure that both sides of the stepladder are locked into place. Ask someone to hold it for you, if possible. Clearly mark and make sure that you can see: Any grab bars or handrails. First and last steps. Where the edge of each step is. Use tools that help you move around (mobility aids) if they are needed. These include: Canes. Walkers. Scooters. Crutches. Turn on the lights when you go into a dark area. Replace any light bulbs as soon as they burn out. Set up your furniture so you have a clear path. Avoid moving your furniture around. If any of your floors are uneven, fix them. If there are any pets around you, be aware of where they are. Review your medicines with your doctor. Some medicines can make you feel dizzy. This can increase your chance of falling. Ask your doctor what other things that you can do to help prevent falls. This information is not intended to replace advice given to you by your health care provider. Make sure you discuss any questions you have with your health care provider. Document Released: 10/07/2009 Document Revised: 05/18/2016 Document Reviewed: 01/15/2015 Elsevier Interactive Patient Education  2017 [employer].

## 2021-08-01 NOTE — Telephone Encounter (Signed)
Pt called and discussed.  She is needing to hold off on prolia for short while - while having some dental issues - she will let us know when she wants to get it.

## 2021-08-01 NOTE — Telephone Encounter (Signed)
Pt aware referral has been placed but there is no new info on it right now

## 2021-08-01 NOTE — Telephone Encounter (Signed)
NA , no VM - jhb 08/01/21

## 2021-08-01 NOTE — Telephone Encounter (Signed)
Returning nurse call.

## 2021-08-01 NOTE — Progress Notes (Signed)
Subjective:   Danielle Parrish is a 80 y.o. female who presents for Medicare Annual (Subsequent) preventive examination.  Virtual Visit via Telephone Note  I connected with  Danielle Parrish on 08/01/21 at  8:15 AM EDT by telephone and verified that I am speaking with the correct person using two identifiers.  Location: Patient: Home Provider: [facility] Persons participating in the virtual visit: patient/Nurse Health Advisor   I discussed the limitations, risks, security and privacy concerns of performing an evaluation and management service by telephone and the availability of in person appointments. The patient expressed understanding and agreed to proceed.  Interactive audio and video telecommunications were attempted between this nurse and patient, however failed, due to patient having technical difficulties OR patient did not have access to video capability.  We continued and completed visit with audio only.  Some vital signs may be absent or patient reported.   Danielle Parrish E Danielle Hagwood, LPN   Review of Systems     Cardiac Risk Factors include: advanced age (>63mn, >>52women);diabetes mellitus;dyslipidemia;hypertension;obesity (BMI >30kg/m2);sedentary lifestyle;Other (see comment), Risk factor comments: CHF, OSA     Objective:    Today's Vitals   08/01/21 0815 08/01/21 0816  Weight: 183 lb (83 kg)   Height: '5\' 3"'$  (1.6 m)   PainSc:  8    Body mass index is 32.42 kg/m.  Advanced Directives 08/01/2021 05/15/2021 05/03/2021 07/28/2020 06/14/2020 10/22/2019 10/17/2019  Does Patient Have a Medical Advance Directive? No No No No No No No  Would patient like information on creating a medical advance directive? No - Patient declined No - Patient declined - No - Patient declined - No - Patient declined No - Patient declined    Current Medications (verified) Outpatient Encounter Medications as of 08/01/2021  Medication Sig   acetaminophen (TYLENOL) 500 MG tablet Take 1 tablet (500 mg total) by  mouth every 6 (six) hours as needed.   albuterol (VENTOLIN HFA) 108 (90 Base) MCG/ACT inhaler TAKE 2 PUFFS BY MOUTH EVERY 6 HOURS AS NEEDED FOR WHEEZE OR SHORTNESS OF BREATH   amLODipine (NORVASC) 10 MG tablet Take 1 tablet (10 mg total) by mouth daily.   calcium-vitamin D (OSCAL WITH D) 500-200 MG-UNIT TABS tablet TAKE 1 TABLET BY MOUTH EVERY DAY WITH BREAKFAST   Cholecalciferol (VITAMIN D-3 PO) Take 1 capsule by mouth daily.    cyclobenzaprine (FLEXERIL) 5 MG tablet Take 1 tablet (5 mg total) by mouth 3 (three) times daily as needed for muscle spasms.   denosumab (PROLIA) 60 MG/ML SOSY injection Bring to your doctor to inject 60 mg subcutaneously.   diclofenac Sodium (VOLTAREN) 1 % GEL APPLY 4 GRAMS TOPICALLY 4 (FOUR) TIMES DAILY.   diphenhydrAMINE (BENADRYL) 25 MG tablet Take 50 mg by mouth at bedtime as needed for allergies.    gabapentin (NEURONTIN) 300 MG capsule Take 300 mg by mouth 3 (three) times daily.   ketoconazole (NIZORAL) 2 % cream Apply to affected area everyday   levothyroxine (SYNTHROID) 100 MCG tablet Take 1 tablet (100 mcg total) by mouth daily before breakfast.   lidocaine (LIDODERM) 5 % PLACE 1 PATCH ONTO THE SKIN DAILY. REMOVE & DISCARD PATCH WITHIN 12 HOURS OR AS DIRECTED BY MD   lisinopril (ZESTRIL) 20 MG tablet Take 1 tablet (20 mg total) by mouth daily.   lovastatin (MEVACOR) 40 MG tablet TAKE 1 TABLET BY MOUTH EVERYDAY AT BEDTIME   mirabegron ER (MYRBETRIQ) 25 MG TB24 tablet Take 1 tablet (25 mg total) by mouth daily.  Multiple Vitamin (MULTI-VITAMIN) tablet Take by mouth.   Multiple Vitamins-Minerals (PRESERVISION AREDS 2 PO) Take by mouth.   nitrofurantoin, macrocrystal-monohydrate, (MACROBID) 100 MG capsule Take 100 mg by mouth daily.   nystatin (MYCOSTATIN/NYSTOP) powder Apply topically 4 (four) times daily.   nystatin cream (MYCOSTATIN) Apply 1 application topically 2 (two) times daily.   omeprazole (PRILOSEC) 20 MG capsule Take 1 capsule (20 mg total) by mouth  2 (two) times daily before a meal.   PROLIA 60 MG/ML SOSY injection TO BE ADMINISTERED IN PHYSICIAN'S OFFICE. INJECT ONE SYRINGE SUBCOUSLY ONCE EVERY 6 MONTHS. REFRIGERATE. USE WITHIN 14 DAYS ONCE AT ROOM TEMPERATURE.   pyridOXINE (VITAMIN B-6) 100 MG tablet Take 100 mg by mouth daily.   sertraline (ZOLOFT) 100 MG tablet Take 2 tablets (200 mg total) by mouth daily.   sodium chloride (OCEAN) 0.65 % SOLN nasal spray Place 1 spray into both nostrils as needed for congestion.   [DISCONTINUED] gabapentin (NEURONTIN) 100 MG capsule Take 2 capsules (200 mg total) by mouth 3 (three) times daily.   No facility-administered encounter medications on file as of 08/01/2021.    Allergies (verified) Crestor [rosuvastatin], Nsaids, Vytorin [ezetimibe-simvastatin], Aspirin, and Tolmetin   History: Past Medical History:  Diagnosis Date   Anxiety    Arthritis    CHF (congestive heart failure) (HCC)    Chronic pain of multiple joints    Depression    Essential hypertension    GERD (gastroesophageal reflux disease)    Hyperlipidemia    Hyperlipidemia associated with type 2 diabetes mellitus ([place])    Hypertension associated with type 2 diabetes mellitus ([place])    Hypothyroidism    Osteoporosis    Sleep apnea    CPAP   Type 2 diabetes mellitus with hypercholesterolemia ([place])    Vitamin D deficiency    Past Surgical History:  Procedure Laterality Date   ABDOMINAL HERNIA REPAIR     ABDOMINAL HYSTERECTOMY  1977   BREAST EXCISIONAL BIOPSY Right    EYE SURGERY  2020   bilateral cataract removal    KNEE ARTHROSCOPY Left 09/30/2014   Procedure: LEFT KNEE ARTHROSCOPY MEDIAL MENISECTOMY ABRASION CHONDROPLASTY SYNOVECTOMY SUPRAPATELLER CUFF;  Surgeon: Tobi Bastos, MD;  Location: [facility];  Service: Orthopedics;  Laterality: Left;   Family History  Problem Relation Age of Onset   Hypertension Mother    Stroke Mother    Cancer Father    Social History   Socioeconomic History   Marital status:  Widowed    Spouse name: Laverna Peace   Number of children: 2   Years of education: Not on file   Highest education level: High school graduate  Occupational History   Occupation: CNA    Comment: [employer]   Occupation: retired  Tobacco Use   Smoking status: Never   Smokeless tobacco: Never  Scientific laboratory technician Use: Never used  Substance and Sexual Activity   Alcohol use: No   Drug use: No   Sexual activity: Not Currently  Other Topics Concern   Not on file  Social History Narrative   Lives with her daughter   Social Determinants of Health   Financial Resource Strain: Low Risk    Difficulty of Paying Living Expenses: Not very hard  Food Insecurity: No Food Insecurity   Worried About Charity fundraiser in the Last Year: Never true   [place] in the Last Year: Never true  Transportation Needs: No Transportation Needs   Lack of Transportation (Medical):  No   Lack of Transportation (Non-Medical): No  Physical Activity: Insufficiently Active   Days of Exercise per Week: 3 days   Minutes of Exercise per Session: 20 min  Stress: No Stress Concern Present   Feeling of Stress : Not at all  Social Connections: Socially Isolated   Frequency of Communication with Friends and Family: More than three times a week   Frequency of Social Gatherings with Friends and Family: More than three times a week   Attends Religious Services: Never   Marine scientist or Organizations: No   Attends Archivist Meetings: Never   Marital Status: Widowed    Tobacco Counseling Counseling given: Not Answered   Clinical Intake:  Pre-visit preparation completed: Yes  Pain : 0-10 Pain Score: 8  Pain Type: Chronic pain Pain Location: Back Pain Descriptors / Indicators: Sore, Aching Pain Onset: More than a month ago Pain Frequency: Constant     BMI - recorded: 32.42 Nutritional Status: BMI > 30  Obese Nutritional Risks: None Diabetes: Yes CBG done?: No Did pt. bring in  CBG monitor from home?: No  How often do you need to have someone help you when you read instructions, pamphlets, or other written materials from your doctor or pharmacy?: 1 - Never  Nutrition Risk Assessment:  Has the patient had any N/V/D within the last 2 months?  No  Does the patient have any non-healing wounds?  No  Has the patient had any unintentional weight loss or weight gain?  No   Diabetes:  Is the patient diabetic?  Yes  If diabetic, was a CBG obtained today?  No  Did the patient bring in their glucometer from home?  No  How often do you monitor your CBG's? none.   Financial Strains and Diabetes Management:  Are you having any financial strains with the device, your supplies or your medication? No .  Does the patient want to be seen by Chronic Care Management for management of their diabetes?  No  Would the patient like to be referred to a Nutritionist or for Diabetic Management?  No   Diabetic Exams:  Diabetic Eye Exam: Completed 10/18/2020.  Diabetic Foot Exam: Completed 04/21/2021. Pt has been advised about the importance in completing this exam. Pt is scheduled for diabetic foot exam on next year.    Interpreter Needed?: No  Information entered by :: Zina Pitzer, LPN   Activities of Daily Living In your present state of health, do you have any difficulty performing the following activities: 08/01/2021  Hearing? N  Vision? N  Difficulty concentrating or making decisions? Y  Walking or climbing stairs? N  Dressing or bathing? N  Doing errands, shopping? N  Comment only if out of town  Conservation officer, nature and eating ? N  Using the Toilet? N  In the past six months, have you accidently leaked urine? Y  Comment sometimes - takes Myrbetriq prn only  Do you have problems with loss of bowel control? N  Managing your Medications? N  Managing your Finances? N  Housekeeping or managing your Housekeeping? N  Some recent data might be hidden    Patient Care  Team: Dettinger, Fransisca Kaufmann, MD as PCP - General (Family Medicine) Ilean China, RN as Case Manager Sandford Craze, MD as Referring Physician (Dermatology)  Indicate any recent Medical Services you may have received from other than [facility] providers in the past year (date may be approximate).     Assessment:  This is a routine wellness examination for Danielle Parrish.  Hearing/Vision screen Hearing Screening - Comments:: Wears hearing aids - from [facility] in [place] - Comments:: Wears eyeglasses - up to date with annual eye exams [facility].  Dietary issues and exercise activities discussed: Current Exercise Habits: Home exercise routine, Type of exercise: walking;stretching, Time (Minutes): 20, Frequency (Times/Week): 4, Weekly Exercise (Minutes/Week): 80, Intensity: Mild, Exercise limited by: orthopedic condition(s);respiratory conditions(s);cardiac condition(s)   Goals Addressed             This Visit's Progress    DIET - INCREASE WATER INTAKE   Not on track    Try to drink 6-8 glasses of water daily     Exercise 150 min/wk Moderate Activity   Not on track    Have 3 meals a day   No change      Depression Screen PHQ 2/9 Scores 08/01/2021 07/29/2021 06/20/2021 06/07/2021 05/25/2021 04/21/2021 07/28/2020  PHQ - 2 Score 2 2 0 0 2 4 0  PHQ- 9 Score '8 10 4 6 4 11 '$ -    Fall Risk Fall Risk  08/01/2021 06/20/2021 06/07/2021 05/25/2021 04/21/2021  Falls in the past year? '1 1 1 1 '$ 0  Number falls in past yr: '1 1 1 1 '$ -  Injury with Fall? '1 1 1 1 '$ -  Comment - - - - -  Risk Factor Category  - - - - -  Risk for fall due to : History of fall(s);Impaired balance/gait;Impaired vision;Medication side effect;Orthopedic patient Impaired mobility;History of fall(s) - History of fall(s) -  Risk for fall due to: Comment - - - - -  Follow up Education provided;Falls prevention discussed Falls evaluation completed Falls prevention discussed Falls evaluation completed -    FALL  RISK PREVENTION PERTAINING TO THE HOME:  Any stairs in or around the home? Yes  If so, are there any without handrails? No  Home free of loose throw rugs in walkways, pet beds, electrical cords, etc? Yes  Adequate lighting in your home to reduce risk of falls? Yes   ASSISTIVE DEVICES UTILIZED TO PREVENT FALLS:  Life alert? No  Use of a cane, walker or w/c? Yes  Grab bars in the bathroom? Yes  Shower chair or bench in shower? Yes  Elevated toilet seat or a handicapped toilet? Yes   TIMED UP AND GO:  Was the test performed? No . Telephonic visit  Cognitive Function: MMSE - Mini Mental State Exam 07/23/2018  Orientation to time 5  Orientation to Place 5  Registration 3  Attention/ Calculation 4  Recall 2  Language- name 2 objects 2  Language- repeat 1  Language- follow 3 step command 3  Language- read & follow direction 1  Write a sentence 1  Copy design 1  Total score 28     6CIT Screen 08/01/2021 07/28/2020 07/28/2019  What Year? 0 points 0 points 0 points  What month? 0 points 0 points 0 points  What time? 0 points 0 points 0 points  Count back from 20 0 points 0 points 2 points  Months in reverse 2 points 0 points 0 points  Repeat phrase 0 points 0 points 0 points  Total Score 2 0 2    Immunizations Immunization History  Administered Date(s) Administered   Influenza, High Dose Seasonal PF 11/14/2017, 10/01/2018   Influenza,inj,Quad PF,6+ Mos 10/15/2013, 11/17/2014, 09/23/2015, 09/14/2016   Influenza-Unspecified 10/22/2019   Moderna Sars-Covid-2 Vaccination 01/17/2020, 02/14/2020, 10/29/2020   Pneumococcal  Conjugate-13 04/08/2015   Pneumococcal-Unspecified 12/26/2013   Tdap 10/15/2013   Zoster Recombinat (Shingrix) 08/06/2019, 12/02/2019   Zoster, Live 12/26/2013    TDAP status: Up to date  Flu Vaccine status: Up to date  Pneumococcal vaccine status: Up to date  Covid-19 vaccine status: Completed vaccines  Qualifies for Shingles Vaccine? Yes   Zostavax  completed Yes   Shingrix Completed?: Yes  Screening Tests Health Maintenance  Topic Date Due   INFLUENZA VACCINE  07/25/2021   COVID-19 Vaccine (4 - Booster for Moderna series) 07/20/2022 (Originally 02/26/2021)   OPHTHALMOLOGY EXAM  10/18/2021   HEMOGLOBIN A1C  10/21/2021   FOOT EXAM  04/21/2022   TETANUS/TDAP  10/16/2023   DEXA SCAN  Completed   PNA vac Low Risk Adult  Completed   Zoster Vaccines- Shingrix  Completed   HPV VACCINES  Aged Out    Health Maintenance  Health Maintenance Due  Topic Date Due   INFLUENZA VACCINE  07/25/2021    Colorectal cancer screening: No longer required.   Mammogram status: Completed 02/28/2021. Repeat every year  Bone Density status: Completed 10/28/2018. Results reflect: Bone density results: OSTEOPOROSIS. Repeat every 2 years.  Lung Cancer Screening: (Low Dose CT Chest recommended if Age 76-80 years, 30 pack-year currently smoking OR have quit w/in 15years.) does not qualify.   Additional Screening:  Hepatitis C Screening: does not qualify  Vision Screening: Recommended annual ophthalmology exams for early detection of glaucoma and other disorders of the eye. Is the patient up to date with their annual eye exam?  Yes  Who is the provider or what is the name of the office in which the patient attends annual eye exams? [facility] If pt is not established with a provider, would they like to be referred to a provider to establish care? No .   Dental Screening: Recommended annual dental exams for proper oral hygiene  Community Resource Referral / Chronic Care Management: CRR required this visit?  No   CCM required this visit?  No      Plan:     I have personally reviewed and noted the following in the patient's chart:   Medical and social history Use of alcohol, tobacco or illicit drugs  Current medications and supplements including opioid prescriptions.  Functional ability and status Nutritional status Physical activity Advanced  directives List of other physicians Hospitalizations, surgeries, and ER visits in previous 12 months Vitals Screenings to include cognitive, depression, and falls Referrals and appointments  In addition, I have reviewed and discussed with patient certain preventive protocols, quality metrics, and best practice recommendations. A written personalized care plan for preventive services as well as general preventive health recommendations were provided to patient.     Sandrea Hammond, LPN   [id number]   Nurse Notes: None

## 2021-08-05 ENCOUNTER — Emergency Department (HOSPITAL_COMMUNITY)
Admission: EM | Admit: 2021-08-05 | Discharge: 2021-08-05 | Disposition: A | Discharge status: Home / Self Care | Payer: Medicare Other | Attending: Emergency Medicine | Admitting: Emergency Medicine

## 2021-08-05 ENCOUNTER — Other Ambulatory Visit: Payer: Self-pay

## 2021-08-05 ENCOUNTER — Emergency Department (HOSPITAL_COMMUNITY): Payer: Medicare Other

## 2021-08-05 ENCOUNTER — Encounter (HOSPITAL_COMMUNITY): Payer: Self-pay | Admitting: *Deleted

## 2021-08-05 DIAGNOSIS — M7989 Other specified soft tissue disorders: Secondary | ICD-10-CM | POA: Diagnosis not present

## 2021-08-05 DIAGNOSIS — Z8616 Personal history of COVID-19: Secondary | ICD-10-CM | POA: Diagnosis not present

## 2021-08-05 DIAGNOSIS — I110 Hypertensive heart disease with heart failure: Secondary | ICD-10-CM | POA: Diagnosis not present

## 2021-08-05 DIAGNOSIS — Z743 Need for continuous supervision: Secondary | ICD-10-CM | POA: Diagnosis not present

## 2021-08-05 DIAGNOSIS — S42201A Unspecified fracture of upper end of right humerus, initial encounter for closed fracture: Secondary | ICD-10-CM | POA: Insufficient documentation

## 2021-08-05 DIAGNOSIS — R0902 Hypoxemia: Secondary | ICD-10-CM | POA: Diagnosis not present

## 2021-08-05 DIAGNOSIS — S52531A Colles' fracture of right radius, initial encounter for closed fracture: Secondary | ICD-10-CM | POA: Diagnosis not present

## 2021-08-05 DIAGNOSIS — Y92242 Post office as the place of occurrence of the external cause: Secondary | ICD-10-CM | POA: Diagnosis not present

## 2021-08-05 DIAGNOSIS — E1169 Type 2 diabetes mellitus with other specified complication: Secondary | ICD-10-CM | POA: Insufficient documentation

## 2021-08-05 DIAGNOSIS — S6991XA Unspecified injury of right wrist, hand and finger(s), initial encounter: Secondary | ICD-10-CM | POA: Diagnosis not present

## 2021-08-05 DIAGNOSIS — W1839XA Other fall on same level, initial encounter: Secondary | ICD-10-CM | POA: Insufficient documentation

## 2021-08-05 DIAGNOSIS — M25531 Pain in right wrist: Secondary | ICD-10-CM | POA: Diagnosis not present

## 2021-08-05 DIAGNOSIS — E039 Hypothyroidism, unspecified: Secondary | ICD-10-CM | POA: Insufficient documentation

## 2021-08-05 DIAGNOSIS — Z79899 Other long term (current) drug therapy: Secondary | ICD-10-CM | POA: Diagnosis not present

## 2021-08-05 DIAGNOSIS — I5032 Chronic diastolic (congestive) heart failure: Secondary | ICD-10-CM | POA: Insufficient documentation

## 2021-08-05 DIAGNOSIS — S42401A Unspecified fracture of lower end of right humerus, initial encounter for closed fracture: Secondary | ICD-10-CM | POA: Diagnosis not present

## 2021-08-05 DIAGNOSIS — M25511 Pain in right shoulder: Secondary | ICD-10-CM | POA: Insufficient documentation

## 2021-08-05 DIAGNOSIS — E785 Hyperlipidemia, unspecified: Secondary | ICD-10-CM | POA: Insufficient documentation

## 2021-08-05 DIAGNOSIS — W19XXXA Unspecified fall, initial encounter: Secondary | ICD-10-CM

## 2021-08-05 DIAGNOSIS — M25539 Pain in unspecified wrist: Secondary | ICD-10-CM | POA: Diagnosis not present

## 2021-08-05 DIAGNOSIS — S42291A Other displaced fracture of upper end of right humerus, initial encounter for closed fracture: Secondary | ICD-10-CM

## 2021-08-05 LAB — CBC WITH DIFFERENTIAL/PLATELET
Basophils Absolute: 0 10*3/uL (ref 0.0–0.2)
Basos: 0 %
EOS (ABSOLUTE): 0.1 10*3/uL (ref 0.0–0.4)
Eos: 1 %
Hematocrit: 41.5 % (ref 34.0–46.6)
Hemoglobin: 13.5 g/dL (ref 11.1–15.9)
Immature Grans (Abs): 0 10*3/uL (ref 0.0–0.1)
Immature Granulocytes: 0 %
Lymphocytes Absolute: 2.5 10*3/uL (ref 0.7–3.1)
Lymphs: 29 %
MCH: 30.8 pg (ref 26.6–33.0)
MCHC: 32.5 g/dL (ref 31.5–35.7)
MCV: 95 fL (ref 79–97)
Monocytes Absolute: 1 10*3/uL — ABNORMAL HIGH (ref 0.1–0.9)
Monocytes: 12 %
Neutrophils Absolute: 5.2 10*3/uL (ref 1.4–7.0)
Neutrophils: 58 %
Platelets: 222 10*3/uL (ref 150–450)
RBC: 4.38 x10E6/uL (ref 3.77–5.28)
RDW: 13.4 % (ref 11.7–15.4)
WBC: 8.9 10*3/uL (ref 3.4–10.8)

## 2021-08-05 LAB — TSH: TSH: 0.978 u[IU]/mL (ref 0.450–4.500)

## 2021-08-05 LAB — VITAMIN D 25 HYDROXY (VIT D DEFICIENCY, FRACTURES): Vit D, 25-Hydroxy: 80.7 ng/mL (ref 30.0–100.0)

## 2021-08-05 LAB — ANA,IFA RA DIAG PNL W/RFLX TIT/PATN
ANA Titer 1: NEGATIVE
Cyclic Citrullin Peptide Ab: 6 units (ref 0–19)
Rheumatoid fact SerPl-aCnc: <10 IU/mL (ref <14.0)

## 2021-08-05 MED ORDER — OXYCODONE-ACETAMINOPHEN 5-325 MG PO TABS
1.0000 | ORAL_TABLET | Freq: Once | ORAL | Status: AC
  Administered 2021-08-05: 1 via ORAL
  Filled 2021-08-05: qty 1

## 2021-08-05 MED ORDER — ONDANSETRON 4 MG PO TBDP: 4.0000 mg | ORAL_TABLET | Freq: Three times a day (TID) | ORAL | 0 refills | Status: AC | PRN

## 2021-08-05 MED ORDER — ONDANSETRON 4 MG PO TBDP
4.0000 mg | ORAL_TABLET | Freq: Once | ORAL | Status: AC
  Administered 2021-08-05: 4 mg via ORAL
  Filled 2021-08-05: qty 1

## 2021-08-05 MED ORDER — ONDANSETRON HCL 4 MG PO TABS
4.0000 mg | ORAL_TABLET | Freq: Once | ORAL | Status: AC
  Administered 2021-08-05: 4 mg via ORAL
  Filled 2021-08-05: qty 1

## 2021-08-05 MED ORDER — OXYCODONE-ACETAMINOPHEN 5-325 MG PO TABS: 1.0000 | ORAL_TABLET | Freq: Three times a day (TID) | ORAL | 0 refills | Status: AC | PRN

## 2021-08-05 NOTE — Discharge Instructions (Addendum)
Do not drive on your oxycodone-acetaminophen. Do not take acetaminophen (tylenol) while also taking your oxycodone-acetaminophen.Take your ondansetron as needed for nausea. Please follow-up with MD Aline Brochure on Monday.

## 2021-08-05 NOTE — ED Provider Notes (Addendum)
[facility] EMERGENCY DEPARTMENT Provider Note   CSN: [id number] Arrival date & time: 08/05/21  1616     History Chief Complaint  Patient presents with   Lytle Michaels    Danielle Parrish is a 80 y.o. female with a past medical history of osteoporosis [place]  Patient states that she was walking and tripped on a curb and fell onto her right side.  Did not lose consciousness.  Unsure if she hit her head but localizes pain to her right shoulder, elbow, wrist. Has a history of vitamin D deficiency and vertebral and right humeral fractures.  Did not take any pain medication PTA.      Past Medical History:  Diagnosis Date   Anxiety    Arthritis    CHF (congestive heart failure) (HCC)    Chronic pain of multiple joints    Depression    Essential hypertension    GERD (gastroesophageal reflux disease)    Hyperlipidemia    Hyperlipidemia associated with type 2 diabetes mellitus ([place])    Hypertension associated with type 2 diabetes mellitus ([place])    Hypothyroidism    Osteoporosis    Sleep apnea    CPAP   Type 2 diabetes mellitus with hypercholesterolemia ([place])    Vitamin D deficiency     Patient Active Problem List   Diagnosis Date Noted   Upper respiratory infection with cough and congestion 07/12/2021   Lab test positive for detection of COVID-19 virus 07/06/2021   Cough 07/06/2021   Pain in left knee 03/30/2020   Pain in right knee 03/30/2020   Age-related osteoporosis with current pathological fracture 03/13/2019   Chronic midline low back pain without sciatica 01/30/2019   Age-related osteoporosis with current pathol fracture of vertebra ([place]) 10/28/2018   Calcification of aorta ([place]) 10/02/2018   Compression fracture of T12 vertebra ([place]) 10/01/2018   Primary osteoarthritis of right knee 07/20/2017   Meniscus degeneration, right 06/14/2017   Varicose veins of left lower extremity with complications [id number]   Pain medication  agreement signed 06/06/2016   Gout attack 01/11/2016   OSA (obstructive sleep apnea) 09/14/2015   Chronic diastolic heart failure ([place]) 09/10/2015   Orthopnea 09/02/2015   Dysphagia, pharyngoesophageal phase 08/04/2015   Anorexia 08/04/2015   Fatigue 08/04/2015   Malaise and fatigue 08/04/2015   Depression, recurrent ([place]) 04/08/2015   Osteoarthritis of left knee 09/30/2014   Vitamin D deficiency    Anxiety    GERD (gastroesophageal reflux disease)    Arthritis    Chronic pain of multiple joints    Hypertension associated with type 2 diabetes mellitus ([place]) 05/15/2013   Hyperlipidemia associated with type 2 diabetes mellitus ([place]) 05/15/2013   Type 2 diabetes mellitus with hypercholesterolemia ([place]) 05/15/2013   Morbid obesity ([place]) 05/15/2013   Hypothyroidism 05/15/2013    Past Surgical History:  Procedure Laterality Date   ABDOMINAL HERNIA REPAIR     ABDOMINAL HYSTERECTOMY  1977   BREAST EXCISIONAL BIOPSY Right    EYE SURGERY  2020   bilateral cataract removal    KNEE ARTHROSCOPY Left 09/30/2014   Procedure: LEFT KNEE ARTHROSCOPY MEDIAL MENISECTOMY ABRASION CHONDROPLASTY SYNOVECTOMY SUPRAPATELLER CUFF;  Surgeon: Tobi Bastos, MD;  Location: [facility];  Service: Orthopedics;  Laterality: Left;     OB History   No obstetric history on file.     Family History  Problem Relation Age of Onset   Hypertension Mother    Stroke Mother  Cancer Father     Social History   Tobacco Use   Smoking status: Never   Smokeless tobacco: Never  Vaping Use   Vaping Use: Never used  Substance Use Topics   Alcohol use: No   Drug use: No    Home Medications Prior to Admission medications   Medication Sig Start Date End Date Taking? Authorizing Provider  ondansetron (ZOFRAN ODT) 4 MG disintegrating tablet Take 1 tablet (4 mg total) by mouth every 8 (eight) hours as needed for up to 5 days for nausea or vomiting. 08/05/21 08/10/21 Yes Caelin Rosen A, PA-C   oxyCODONE-acetaminophen (PERCOCET/ROXICET) 5-325 MG tablet Take 1 tablet by mouth every 8 (eight) hours as needed for up to 5 days for severe pain. 08/05/21 08/10/21 Yes Maxtyn Nuzum A, PA-C  acetaminophen (TYLENOL) 500 MG tablet Take 1 tablet (500 mg total) by mouth every 6 (six) hours as needed. 05/14/20   Ivy Lynn, NP  albuterol (VENTOLIN HFA) 108 (90 Base) MCG/ACT inhaler TAKE 2 PUFFS BY MOUTH EVERY 6 HOURS AS NEEDED FOR WHEEZE OR SHORTNESS OF BREATH 05/12/21   Dettinger, Fransisca Kaufmann, MD  amLODipine (NORVASC) 10 MG tablet Take 1 tablet (10 mg total) by mouth daily. 04/21/21   Dettinger, Fransisca Kaufmann, MD  calcium-vitamin D (OSCAL WITH D) 500-200 MG-UNIT TABS tablet TAKE 1 TABLET BY MOUTH EVERY DAY WITH BREAKFAST 09/06/20   Dettinger, Fransisca Kaufmann, MD  Cholecalciferol (VITAMIN D-3 PO) Take 1 capsule by mouth daily.     [provider]  cyclobenzaprine (FLEXERIL) 5 MG tablet Take 1 tablet (5 mg total) by mouth 3 (three) times daily as needed for muscle spasms. 05/14/20   Ivy Lynn, NP  denosumab (PROLIA) 60 MG/ML SOSY injection Bring to your doctor to inject 60 mg subcutaneously. 04/21/21   Dettinger, Fransisca Kaufmann, MD  diclofenac Sodium (VOLTAREN) 1 % GEL APPLY 4 GRAMS TOPICALLY 4 (FOUR) TIMES DAILY. 04/04/21   Dettinger, Fransisca Kaufmann, MD  diphenhydrAMINE (BENADRYL) 25 MG tablet Take 50 mg by mouth at bedtime as needed for allergies.     [provider]  gabapentin (NEURONTIN) 300 MG capsule Take 300 mg by mouth 3 (three) times daily. 07/31/21   [provider]  ketoconazole (NIZORAL) 2 % cream Apply to affected area everyday 04/21/21   Dettinger, Fransisca Kaufmann, MD  levothyroxine (SYNTHROID) 100 MCG tablet Take 1 tablet (100 mcg total) by mouth daily before breakfast. 04/21/21   Dettinger, Fransisca Kaufmann, MD  lidocaine (LIDODERM) 5 % PLACE 1 PATCH ONTO THE SKIN DAILY. REMOVE & DISCARD PATCH WITHIN 12 HOURS OR AS DIRECTED BY MD 07/12/21   Ivy Lynn, NP  lisinopril (ZESTRIL) 20 MG tablet Take  1 tablet (20 mg total) by mouth daily. 04/21/21   Dettinger, Fransisca Kaufmann, MD  lovastatin (MEVACOR) 40 MG tablet TAKE 1 TABLET BY MOUTH EVERYDAY AT BEDTIME 04/21/21   Dettinger, Fransisca Kaufmann, MD  mirabegron ER (MYRBETRIQ) 25 MG TB24 tablet Take 1 tablet (25 mg total) by mouth daily. 06/07/20   Dettinger, Fransisca Kaufmann, MD  Multiple Vitamin (MULTI-VITAMIN) tablet Take by mouth.    [provider]  Multiple Vitamins-Minerals (PRESERVISION AREDS 2 PO) Take by mouth.    [provider]  nitrofurantoin, macrocrystal-monohydrate, (MACROBID) 100 MG capsule Take 100 mg by mouth daily. 04/18/21   [provider]  nystatin (MYCOSTATIN/NYSTOP) powder Apply topically 4 (four) times daily. 04/10/18   Timmothy Euler, MD  nystatin cream (MYCOSTATIN) Apply 1 application topically 2 (two) times daily.  05/10/21   Dettinger, Fransisca Kaufmann, MD  omeprazole (PRILOSEC) 20 MG capsule Take 1 capsule (20 mg total) by mouth 2 (two) times daily before a meal. 04/21/21   Dettinger, Fransisca Kaufmann, MD  PROLIA 60 MG/ML SOSY injection TO BE ADMINISTERED IN PHYSICIAN'S OFFICE. INJECT ONE SYRINGE SUBCOUSLY ONCE EVERY 6 MONTHS. REFRIGERATE. USE WITHIN 14 DAYS ONCE AT ROOM TEMPERATURE. 04/29/21   Dettinger, Fransisca Kaufmann, MD  pyridOXINE (VITAMIN B-6) 100 MG tablet Take 100 mg by mouth daily.    [provider]  sertraline (ZOLOFT) 100 MG tablet Take 2 tablets (200 mg total) by mouth daily. 04/21/21   Dettinger, Fransisca Kaufmann, MD  sodium chloride (OCEAN) 0.65 % SOLN nasal spray Place 1 spray into both nostrils as needed for congestion. 05/24/17   Timmothy Euler, MD    Allergies    Crestor [rosuvastatin], Nsaids, Vytorin [ezetimibe-simvastatin], Aspirin, and Tolmetin  Review of Systems   Review of Systems  Constitutional:  Negative for fatigue and fever.  HENT:  Negative for tinnitus.   Eyes:  Negative for pain and visual disturbance.  Respiratory:  Negative for chest tightness, shortness of breath and wheezing.   Cardiovascular:   Negative for chest pain and palpitations.  Gastrointestinal:  Negative for abdominal pain.  Musculoskeletal:  Positive for arthralgias and joint swelling.  Skin:  Positive for color change and wound.  Neurological:  Negative for dizziness, syncope, weakness, light-headedness and numbness.  Psychiatric/Behavioral:  Negative for confusion.    Physical Exam Updated Vital Signs BP 114/62   Pulse 100   Temp 98.2 F (36.8 C)   Resp 20   Ht '5\' 3"'$  (1.6 m)   Wt 82.6 kg   SpO2 93%   BMI 32.24 kg/m   Physical Exam Vitals and nursing note reviewed.  Constitutional:      Appearance: Normal appearance.  HENT:     Head: Normocephalic and atraumatic.  Eyes:     General: No scleral icterus.    Conjunctiva/sclera: Conjunctivae normal.     Pupils: Pupils are equal, round, and reactive to light.  Cardiovascular:     Rate and Rhythm: Normal rate and regular rhythm.  Pulmonary:     Effort: Pulmonary effort is normal. No respiratory distress.  Musculoskeletal:        General: Swelling, tenderness and deformity present.     Comments: Patient with tenderness to the right radial head.  No snuffbox tenderness.  Radial and ulnar deviation intact. Moderate tenderness to the posterior elbow, ROM intact. Severe tenderness to the anterior right shoulder. Unable to assess ROM due to pain.   Skin:    General: Skin is warm and dry.     Capillary Refill: Capillary refill takes less than 2 seconds.     Findings: Bruising (Dark purple 5x6cm bruise over right shoulder) present. No lesion.  Neurological:     General: No focal deficit present.     Mental Status: She is alert and oriented to person, place, and time.     Sensory: No sensory deficit.     Coordination: Coordination normal.  Psychiatric:        Mood and Affect: Mood normal.    ED Results / Procedures / Treatments   Labs (all labs ordered are listed, but only abnormal results are displayed) Labs Reviewed - No data to  display  EKG None  Radiology DG Shoulder Right  Result Date: 08/05/2021 CLINICAL DATA:  Fall onto right side with right shoulder pain. EXAM: RIGHT SHOULDER - 2+ VIEW  COMPARISON:  Shoulder radiograph 01/01/2019 FINDINGS: Acute proximal humerus fracture primarily involving the humeral neck. There is displacement involving the medial and lateral cortex. No convincing intra-articular extension. Fracture is more cranial than the remote healed proximal humeral fracture. Acromioclavicular alignment is maintained. IMPRESSION: Acute mildly displaced proximal humerus fracture primarily involving the humeral neck. Electronically Signed   By: Keith Rake M.D.   On: 08/05/2021 17:24   DG Elbow Complete Right  Result Date: 08/05/2021 CLINICAL DATA:  Recent fall with known right wrist and humeral fractures, initial encounter EXAM: RIGHT ELBOW - COMPLETE 3+ VIEW COMPARISON:  None. FINDINGS: Mild degenerative changes are noted in the elbow joint. No joint effusion is seen. Considerable posterior soft tissue swelling is noted. IMPRESSION: Degenerative changes without acute fracture. Significant soft tissue swelling posteriorly. Electronically Signed   By: Inez Catalina M.D.   On: 08/05/2021 20:55   DG Wrist Complete Right  Result Date: 08/05/2021 CLINICAL DATA:  Fall onto right side with right shoulder wrist pain. EXAM: RIGHT WRIST - COMPLETE 3+ VIEW COMPARISON:  Radiograph 02/15/2016 FINDINGS: Comminuted and displaced distal radial fracture. Fracture is primarily involving the metaphysis with cortical buckling involving the volar and dorsal cortex. Fracture extends into the radiocarpal and likely distal radioulnar joint. No ulna styloid fracture. No additional fracture. Osteoarthritis of the thumb carpal metacarpal joint. Carpal bones are normally aligned. Mild volar subluxation of the third finger at the metacarpal phalangeal joint is chronic. Generalized soft tissue edema about the wrist. IMPRESSION: Comminuted  and displaced distal radial fracture with extension into the radiocarpal and likely distal radioulnar joints. Electronically Signed   By: Keith Rake M.D.   On: 08/05/2021 17:23    Procedures Procedures   Medications Ordered in ED Medications  oxyCODONE-acetaminophen (PERCOCET/ROXICET) 5-325 MG per tablet 1 tablet (1 tablet Oral Given 08/05/21 1942)  ondansetron (ZOFRAN-ODT) disintegrating tablet 4 mg (4 mg Oral Given 08/05/21 1942)  oxyCODONE-acetaminophen (PERCOCET/ROXICET) 5-325 MG per tablet 1 tablet (1 tablet Oral Given 08/05/21 2146)  ondansetron (ZOFRAN) tablet 4 mg (4 mg Oral Given 08/05/21 2146)    ED Course  I have reviewed the triage vital signs and the nursing notes. Case discussed with MD Sabra Heck.  Pertinent labs & imaging results that were available during my care of the patient were reviewed by me and considered in my medical decision making (see chart for details).    MDM Rules/Calculators/A&P                         Imaging revealed a comminuted and displaced distal radial fracture with extension into the radiocarpal and likely distal radial ulnar joints.  Patient also found to have an acute mildly displaced proximal humerus fracture involving the humeral neck.  Patient remains neurovascularly intact.  No fracture noted to the elbow.   Spoke with Dr. Arther Abbott who instructed patient to follow-up at the start of next week.  Patient notified of the results, given pain medication.  Sugar-tong and sling in place. Discharging with 5 days of TID percocet and Zofran.   Final Clinical Impression(s) / ED Diagnoses Final diagnoses:  Fall, initial encounter  Closed Colles' fracture of right radius, initial encounter  Humerus head fracture, right, closed, initial encounter    Rx / DC Orders Results and diagnoses were explained to the patient. Return precautions discussed in full. Patient had no additional questions and expressed complete understanding.     Rhae Hammock, PA-C 08/05/21 2204  Darliss Ridgel 08/05/21 2205    Noemi Chapel, MD 08/06/21 1224

## 2021-08-05 NOTE — ED Provider Notes (Signed)
Medical screening examination/treatment/procedure(s) were conducted as a shared visit with non-physician practitioner(s) and myself.  I personally evaluated the patient during the encounter.  Clinical Impression:   Final diagnoses:  Fall, initial encounter  Closed Colles' fracture of right radius, initial encounter  Humerus head fracture, right, closed, initial encounter    This patient arrives after having a fall earlier in the day where she landed on her right arm.  She has pain in the elbow the shoulder and the wrist but deformities only at the wrist and the shoulder, I have personally viewed and interpreted the x-rays which show that there is a comminuted proximal humerus fracture, there is a minimally displaced intra-articular distal radial fracture.  Vital signs unremarkable, these are closed fracture, she will follow-up with orthopedics.  She is agreeable.     Noemi Chapel, MD 08/06/21 [phone]

## 2021-08-05 NOTE — ED Triage Notes (Signed)
Pt fell after going [place] step [place] post office [place].  C/o right shoulder and right wrist pain.  Pt unable to state if she hit head.  Denies taking any blood thinners.

## 2021-08-06 DIAGNOSIS — S22080A Wedge compression fracture of T11-T12 vertebra, initial encounter for closed fracture: Secondary | ICD-10-CM | POA: Diagnosis not present

## 2021-08-06 DIAGNOSIS — M5124 Other intervertebral disc displacement, thoracic region: Secondary | ICD-10-CM | POA: Diagnosis not present

## 2021-08-06 DIAGNOSIS — M47814 Spondylosis without myelopathy or radiculopathy, thoracic region: Secondary | ICD-10-CM | POA: Diagnosis not present

## 2021-08-06 DIAGNOSIS — M47894 Other spondylosis, thoracic region: Secondary | ICD-10-CM | POA: Diagnosis not present

## 2021-08-06 DIAGNOSIS — S22070D Wedge compression fracture of T9-T10 vertebra, subsequent encounter for fracture with routine healing: Secondary | ICD-10-CM | POA: Diagnosis not present

## 2021-08-06 DIAGNOSIS — S42201A Unspecified fracture of upper end of right humerus, initial encounter for closed fracture: Secondary | ICD-10-CM | POA: Diagnosis not present

## 2021-08-06 DIAGNOSIS — S52501A Unspecified fracture of the lower end of right radius, initial encounter for closed fracture: Secondary | ICD-10-CM | POA: Diagnosis not present

## 2021-08-06 DIAGNOSIS — Z96611 Presence of right artificial shoulder joint: Secondary | ICD-10-CM | POA: Diagnosis not present

## 2021-08-06 DIAGNOSIS — E039 Hypothyroidism, unspecified: Secondary | ICD-10-CM | POA: Diagnosis not present

## 2021-08-06 DIAGNOSIS — J9691 Respiratory failure, unspecified with hypoxia: Secondary | ICD-10-CM | POA: Diagnosis not present

## 2021-08-06 DIAGNOSIS — M810 Age-related osteoporosis without current pathological fracture: Secondary | ICD-10-CM | POA: Diagnosis not present

## 2021-08-06 DIAGNOSIS — J9601 Acute respiratory failure with hypoxia: Secondary | ICD-10-CM | POA: Diagnosis not present

## 2021-08-06 DIAGNOSIS — M549 Dorsalgia, unspecified: Secondary | ICD-10-CM | POA: Diagnosis not present

## 2021-08-06 DIAGNOSIS — M5136 Other intervertebral disc degeneration, lumbar region: Secondary | ICD-10-CM | POA: Diagnosis not present

## 2021-08-06 DIAGNOSIS — R5383 Other fatigue: Secondary | ICD-10-CM | POA: Diagnosis not present

## 2021-08-06 DIAGNOSIS — N179 Acute kidney failure, unspecified: Secondary | ICD-10-CM | POA: Diagnosis not present

## 2021-08-06 DIAGNOSIS — T402X1A Poisoning by other opioids, accidental (unintentional), initial encounter: Secondary | ICD-10-CM | POA: Diagnosis not present

## 2021-08-06 DIAGNOSIS — Z87891 Personal history of nicotine dependence: Secondary | ICD-10-CM | POA: Diagnosis not present

## 2021-08-06 DIAGNOSIS — E785 Hyperlipidemia, unspecified: Secondary | ICD-10-CM | POA: Diagnosis not present

## 2021-08-06 DIAGNOSIS — S42231A 3-part fracture of surgical neck of right humerus, initial encounter for closed fracture: Secondary | ICD-10-CM | POA: Diagnosis not present

## 2021-08-06 DIAGNOSIS — M25511 Pain in right shoulder: Secondary | ICD-10-CM | POA: Diagnosis not present

## 2021-08-06 DIAGNOSIS — I10 Essential (primary) hypertension: Secondary | ICD-10-CM | POA: Diagnosis not present

## 2021-08-06 DIAGNOSIS — S52501D Unspecified fracture of the lower end of right radius, subsequent encounter for closed fracture with routine healing: Secondary | ICD-10-CM | POA: Diagnosis not present

## 2021-08-06 DIAGNOSIS — T40601A Poisoning by unspecified narcotics, accidental (unintentional), initial encounter: Secondary | ICD-10-CM | POA: Diagnosis not present

## 2021-08-06 DIAGNOSIS — Z741 Need for assistance with personal care: Secondary | ICD-10-CM | POA: Diagnosis not present

## 2021-08-06 DIAGNOSIS — J9602 Acute respiratory failure with hypercapnia: Secondary | ICD-10-CM | POA: Diagnosis not present

## 2021-08-06 DIAGNOSIS — S22080D Wedge compression fracture of T11-T12 vertebra, subsequent encounter for fracture with routine healing: Secondary | ICD-10-CM | POA: Diagnosis not present

## 2021-08-06 DIAGNOSIS — S22070A Wedge compression fracture of T9-T10 vertebra, initial encounter for closed fracture: Secondary | ICD-10-CM | POA: Diagnosis not present

## 2021-08-06 DIAGNOSIS — M4854XA Collapsed vertebra, not elsewhere classified, thoracic region, initial encounter for fracture: Secondary | ICD-10-CM | POA: Diagnosis not present

## 2021-08-06 DIAGNOSIS — S42211A Unspecified displaced fracture of surgical neck of right humerus, initial encounter for closed fracture: Secondary | ICD-10-CM | POA: Diagnosis not present

## 2021-08-06 DIAGNOSIS — H539 Unspecified visual disturbance: Secondary | ICD-10-CM | POA: Diagnosis not present

## 2021-08-06 DIAGNOSIS — G8918 Other acute postprocedural pain: Secondary | ICD-10-CM | POA: Diagnosis not present

## 2021-08-06 DIAGNOSIS — Z049 Encounter for examination and observation for unspecified reason: Secondary | ICD-10-CM | POA: Diagnosis not present

## 2021-08-06 DIAGNOSIS — D62 Acute posthemorrhagic anemia: Secondary | ICD-10-CM | POA: Diagnosis not present

## 2021-08-06 DIAGNOSIS — M26629 Arthralgia of temporomandibular joint, unspecified side: Secondary | ICD-10-CM | POA: Diagnosis not present

## 2021-08-06 DIAGNOSIS — T40601D Poisoning by unspecified narcotics, accidental (unintentional), subsequent encounter: Secondary | ICD-10-CM | POA: Diagnosis not present

## 2021-08-06 DIAGNOSIS — G4730 Sleep apnea, unspecified: Secondary | ICD-10-CM | POA: Diagnosis not present

## 2021-08-06 DIAGNOSIS — S52591A Other fractures of lower end of right radius, initial encounter for closed fracture: Secondary | ICD-10-CM | POA: Diagnosis not present

## 2021-08-06 DIAGNOSIS — G8929 Other chronic pain: Secondary | ICD-10-CM | POA: Diagnosis not present

## 2021-08-06 DIAGNOSIS — R262 Difficulty in walking, not elsewhere classified: Secondary | ICD-10-CM | POA: Diagnosis not present

## 2021-08-06 DIAGNOSIS — I517 Cardiomegaly: Secondary | ICD-10-CM | POA: Diagnosis not present

## 2021-08-06 DIAGNOSIS — Z8249 Family history of ischemic heart disease and other diseases of the circulatory system: Secondary | ICD-10-CM | POA: Diagnosis not present

## 2021-08-06 DIAGNOSIS — J9611 Chronic respiratory failure with hypoxia: Secondary | ICD-10-CM | POA: Diagnosis not present

## 2021-08-06 DIAGNOSIS — S42201D Unspecified fracture of upper end of right humerus, subsequent encounter for fracture with routine healing: Secondary | ICD-10-CM | POA: Diagnosis not present

## 2021-08-06 DIAGNOSIS — S52301A Unspecified fracture of shaft of right radius, initial encounter for closed fracture: Secondary | ICD-10-CM | POA: Diagnosis not present

## 2021-08-06 DIAGNOSIS — J9692 Respiratory failure, unspecified with hypercapnia: Secondary | ICD-10-CM | POA: Diagnosis not present

## 2021-08-06 DIAGNOSIS — M47816 Spondylosis without myelopathy or radiculopathy, lumbar region: Secondary | ICD-10-CM | POA: Diagnosis not present

## 2021-08-06 DIAGNOSIS — S42291A Other displaced fracture of upper end of right humerus, initial encounter for closed fracture: Secondary | ICD-10-CM | POA: Diagnosis not present

## 2021-08-06 DIAGNOSIS — M5134 Other intervertebral disc degeneration, thoracic region: Secondary | ICD-10-CM | POA: Diagnosis not present

## 2021-08-06 DIAGNOSIS — R9082 White matter disease, unspecified: Secondary | ICD-10-CM | POA: Diagnosis not present

## 2021-08-06 DIAGNOSIS — S42351A Displaced comminuted fracture of shaft of humerus, right arm, initial encounter for closed fracture: Secondary | ICD-10-CM | POA: Diagnosis not present

## 2021-08-06 DIAGNOSIS — M5137 Other intervertebral disc degeneration, lumbosacral region: Secondary | ICD-10-CM | POA: Diagnosis not present

## 2021-08-06 DIAGNOSIS — G4733 Obstructive sleep apnea (adult) (pediatric): Secondary | ICD-10-CM | POA: Diagnosis not present

## 2021-08-06 DIAGNOSIS — M4316 Spondylolisthesis, lumbar region: Secondary | ICD-10-CM | POA: Diagnosis not present

## 2021-08-06 DIAGNOSIS — S42211D Unspecified displaced fracture of surgical neck of right humerus, subsequent encounter for fracture with routine healing: Secondary | ICD-10-CM | POA: Diagnosis not present

## 2021-08-06 DIAGNOSIS — D649 Anemia, unspecified: Secondary | ICD-10-CM | POA: Diagnosis not present

## 2021-08-06 DIAGNOSIS — M6281 Muscle weakness (generalized): Secondary | ICD-10-CM | POA: Diagnosis not present

## 2021-08-06 DIAGNOSIS — S62101A Fracture of unspecified carpal bone, right wrist, initial encounter for closed fracture: Secondary | ICD-10-CM | POA: Diagnosis not present

## 2021-08-06 DIAGNOSIS — H109 Unspecified conjunctivitis: Secondary | ICD-10-CM | POA: Diagnosis not present

## 2021-08-06 DIAGNOSIS — R531 Weakness: Secondary | ICD-10-CM | POA: Diagnosis not present

## 2021-08-06 DIAGNOSIS — Z471 Aftercare following joint replacement surgery: Secondary | ICD-10-CM | POA: Diagnosis not present

## 2021-08-06 DIAGNOSIS — D1809 Hemangioma of other sites: Secondary | ICD-10-CM | POA: Diagnosis not present

## 2021-08-09 ENCOUNTER — Ambulatory Visit: Payer: Medicare Other | Admitting: Family Medicine

## 2021-08-12 ENCOUNTER — Telehealth: Payer: Self-pay | Admitting: Family Medicine

## 2021-08-12 NOTE — Telephone Encounter (Signed)
Pt called stating that she was admitted to [facility] for a crushed shoulder. Said she was given a Ambulance person at [employer] that she then overdosed on, even though she only took 1 pill, but her body couldn't take it.   Pt needs MRI cancelled, that she was supposed to have done on 8/24 at [facility].

## 2021-08-17 ENCOUNTER — Ambulatory Visit (HOSPITAL_COMMUNITY): Payer: Medicare Other

## 2021-08-27 DIAGNOSIS — S42201D Unspecified fracture of upper end of right humerus, subsequent encounter for fracture with routine healing: Secondary | ICD-10-CM | POA: Diagnosis not present

## 2021-08-27 DIAGNOSIS — K219 Gastro-esophageal reflux disease without esophagitis: Secondary | ICD-10-CM | POA: Diagnosis not present

## 2021-08-27 DIAGNOSIS — M1991 Primary osteoarthritis, unspecified site: Secondary | ICD-10-CM | POA: Diagnosis not present

## 2021-08-27 DIAGNOSIS — G4733 Obstructive sleep apnea (adult) (pediatric): Secondary | ICD-10-CM | POA: Diagnosis not present

## 2021-08-27 DIAGNOSIS — J9611 Chronic respiratory failure with hypoxia: Secondary | ICD-10-CM | POA: Diagnosis not present

## 2021-08-27 DIAGNOSIS — Z87440 Personal history of urinary (tract) infections: Secondary | ICD-10-CM | POA: Diagnosis not present

## 2021-08-27 DIAGNOSIS — J9692 Respiratory failure, unspecified with hypercapnia: Secondary | ICD-10-CM | POA: Diagnosis not present

## 2021-08-27 DIAGNOSIS — E559 Vitamin D deficiency, unspecified: Secondary | ICD-10-CM | POA: Diagnosis not present

## 2021-08-27 DIAGNOSIS — M6281 Muscle weakness (generalized): Secondary | ICD-10-CM | POA: Diagnosis not present

## 2021-08-27 DIAGNOSIS — E785 Hyperlipidemia, unspecified: Secondary | ICD-10-CM | POA: Diagnosis not present

## 2021-08-27 DIAGNOSIS — Z741 Need for assistance with personal care: Secondary | ICD-10-CM | POA: Diagnosis not present

## 2021-08-27 DIAGNOSIS — I10 Essential (primary) hypertension: Secondary | ICD-10-CM | POA: Diagnosis not present

## 2021-08-27 DIAGNOSIS — S22080D Wedge compression fracture of T11-T12 vertebra, subsequent encounter for fracture with routine healing: Secondary | ICD-10-CM | POA: Diagnosis not present

## 2021-08-27 DIAGNOSIS — D509 Iron deficiency anemia, unspecified: Secondary | ICD-10-CM | POA: Diagnosis not present

## 2021-08-27 DIAGNOSIS — S22080S Wedge compression fracture of T11-T12 vertebra, sequela: Secondary | ICD-10-CM | POA: Diagnosis not present

## 2021-08-27 DIAGNOSIS — G603 Idiopathic progressive neuropathy: Secondary | ICD-10-CM | POA: Diagnosis not present

## 2021-08-27 DIAGNOSIS — S22070D Wedge compression fracture of T9-T10 vertebra, subsequent encounter for fracture with routine healing: Secondary | ICD-10-CM | POA: Diagnosis not present

## 2021-08-27 DIAGNOSIS — R262 Difficulty in walking, not elsewhere classified: Secondary | ICD-10-CM | POA: Diagnosis not present

## 2021-08-27 DIAGNOSIS — T40601D Poisoning by unspecified narcotics, accidental (unintentional), subsequent encounter: Secondary | ICD-10-CM | POA: Diagnosis not present

## 2021-08-27 DIAGNOSIS — S52501D Unspecified fracture of the lower end of right radius, subsequent encounter for closed fracture with routine healing: Secondary | ICD-10-CM | POA: Diagnosis not present

## 2021-08-27 DIAGNOSIS — J9602 Acute respiratory failure with hypercapnia: Secondary | ICD-10-CM | POA: Diagnosis not present

## 2021-08-27 DIAGNOSIS — S22070S Wedge compression fracture of T9-T10 vertebra, sequela: Secondary | ICD-10-CM | POA: Diagnosis not present

## 2021-08-27 DIAGNOSIS — N179 Acute kidney failure, unspecified: Secondary | ICD-10-CM | POA: Diagnosis not present

## 2021-08-27 DIAGNOSIS — D649 Anemia, unspecified: Secondary | ICD-10-CM | POA: Diagnosis not present

## 2021-08-27 DIAGNOSIS — E039 Hypothyroidism, unspecified: Secondary | ICD-10-CM | POA: Diagnosis not present

## 2021-08-27 DIAGNOSIS — S42211D Unspecified displaced fracture of surgical neck of right humerus, subsequent encounter for fracture with routine healing: Secondary | ICD-10-CM | POA: Diagnosis not present

## 2021-08-27 DIAGNOSIS — J9601 Acute respiratory failure with hypoxia: Secondary | ICD-10-CM | POA: Diagnosis not present

## 2021-08-27 DIAGNOSIS — M810 Age-related osteoporosis without current pathological fracture: Secondary | ICD-10-CM | POA: Diagnosis not present

## 2021-08-27 DIAGNOSIS — J9691 Respiratory failure, unspecified with hypoxia: Secondary | ICD-10-CM | POA: Diagnosis not present

## 2021-08-29 DIAGNOSIS — I10 Essential (primary) hypertension: Secondary | ICD-10-CM | POA: Diagnosis not present

## 2021-08-29 DIAGNOSIS — S52501D Unspecified fracture of the lower end of right radius, subsequent encounter for closed fracture with routine healing: Secondary | ICD-10-CM | POA: Diagnosis not present

## 2021-08-29 DIAGNOSIS — S22080S Wedge compression fracture of T11-T12 vertebra, sequela: Secondary | ICD-10-CM | POA: Diagnosis not present

## 2021-08-29 DIAGNOSIS — S42211D Unspecified displaced fracture of surgical neck of right humerus, subsequent encounter for fracture with routine healing: Secondary | ICD-10-CM | POA: Diagnosis not present

## 2021-08-29 DIAGNOSIS — D509 Iron deficiency anemia, unspecified: Secondary | ICD-10-CM | POA: Diagnosis not present

## 2021-08-29 DIAGNOSIS — E559 Vitamin D deficiency, unspecified: Secondary | ICD-10-CM | POA: Diagnosis not present

## 2021-08-29 DIAGNOSIS — M1991 Primary osteoarthritis, unspecified site: Secondary | ICD-10-CM | POA: Diagnosis not present

## 2021-08-29 DIAGNOSIS — S22070S Wedge compression fracture of T9-T10 vertebra, sequela: Secondary | ICD-10-CM | POA: Diagnosis not present

## 2021-08-29 DIAGNOSIS — E785 Hyperlipidemia, unspecified: Secondary | ICD-10-CM | POA: Diagnosis not present

## 2021-08-29 DIAGNOSIS — G4733 Obstructive sleep apnea (adult) (pediatric): Secondary | ICD-10-CM | POA: Diagnosis not present

## 2021-08-29 DIAGNOSIS — E039 Hypothyroidism, unspecified: Secondary | ICD-10-CM | POA: Diagnosis not present

## 2021-09-01 DIAGNOSIS — E785 Hyperlipidemia, unspecified: Secondary | ICD-10-CM | POA: Diagnosis not present

## 2021-09-01 DIAGNOSIS — M1991 Primary osteoarthritis, unspecified site: Secondary | ICD-10-CM | POA: Diagnosis not present

## 2021-09-01 DIAGNOSIS — S22070S Wedge compression fracture of T9-T10 vertebra, sequela: Secondary | ICD-10-CM | POA: Diagnosis not present

## 2021-09-01 DIAGNOSIS — S22080S Wedge compression fracture of T11-T12 vertebra, sequela: Secondary | ICD-10-CM | POA: Diagnosis not present

## 2021-09-01 DIAGNOSIS — I10 Essential (primary) hypertension: Secondary | ICD-10-CM | POA: Diagnosis not present

## 2021-09-01 DIAGNOSIS — G4733 Obstructive sleep apnea (adult) (pediatric): Secondary | ICD-10-CM | POA: Diagnosis not present

## 2021-09-01 DIAGNOSIS — D509 Iron deficiency anemia, unspecified: Secondary | ICD-10-CM | POA: Diagnosis not present

## 2021-09-01 DIAGNOSIS — S52501D Unspecified fracture of the lower end of right radius, subsequent encounter for closed fracture with routine healing: Secondary | ICD-10-CM | POA: Diagnosis not present

## 2021-09-01 DIAGNOSIS — E559 Vitamin D deficiency, unspecified: Secondary | ICD-10-CM | POA: Diagnosis not present

## 2021-09-01 DIAGNOSIS — E039 Hypothyroidism, unspecified: Secondary | ICD-10-CM | POA: Diagnosis not present

## 2021-09-01 DIAGNOSIS — S42211D Unspecified displaced fracture of surgical neck of right humerus, subsequent encounter for fracture with routine healing: Secondary | ICD-10-CM | POA: Diagnosis not present

## 2021-09-07 DIAGNOSIS — D509 Iron deficiency anemia, unspecified: Secondary | ICD-10-CM | POA: Diagnosis not present

## 2021-09-07 DIAGNOSIS — E559 Vitamin D deficiency, unspecified: Secondary | ICD-10-CM | POA: Diagnosis not present

## 2021-09-07 DIAGNOSIS — S52501D Unspecified fracture of the lower end of right radius, subsequent encounter for closed fracture with routine healing: Secondary | ICD-10-CM | POA: Diagnosis not present

## 2021-09-07 DIAGNOSIS — E039 Hypothyroidism, unspecified: Secondary | ICD-10-CM | POA: Diagnosis not present

## 2021-09-07 DIAGNOSIS — E785 Hyperlipidemia, unspecified: Secondary | ICD-10-CM | POA: Diagnosis not present

## 2021-09-07 DIAGNOSIS — Z87440 Personal history of urinary (tract) infections: Secondary | ICD-10-CM | POA: Diagnosis not present

## 2021-09-07 DIAGNOSIS — S42211D Unspecified displaced fracture of surgical neck of right humerus, subsequent encounter for fracture with routine healing: Secondary | ICD-10-CM | POA: Diagnosis not present

## 2021-09-07 DIAGNOSIS — I10 Essential (primary) hypertension: Secondary | ICD-10-CM | POA: Diagnosis not present

## 2021-09-07 DIAGNOSIS — K219 Gastro-esophageal reflux disease without esophagitis: Secondary | ICD-10-CM | POA: Diagnosis not present

## 2021-09-07 DIAGNOSIS — G4733 Obstructive sleep apnea (adult) (pediatric): Secondary | ICD-10-CM | POA: Diagnosis not present

## 2021-09-07 DIAGNOSIS — M1991 Primary osteoarthritis, unspecified site: Secondary | ICD-10-CM | POA: Diagnosis not present

## 2021-09-08 DIAGNOSIS — G603 Idiopathic progressive neuropathy: Secondary | ICD-10-CM | POA: Diagnosis not present

## 2021-09-08 DIAGNOSIS — D509 Iron deficiency anemia, unspecified: Secondary | ICD-10-CM | POA: Diagnosis not present

## 2021-09-08 DIAGNOSIS — S22070S Wedge compression fracture of T9-T10 vertebra, sequela: Secondary | ICD-10-CM | POA: Diagnosis not present

## 2021-09-08 DIAGNOSIS — S52501D Unspecified fracture of the lower end of right radius, subsequent encounter for closed fracture with routine healing: Secondary | ICD-10-CM | POA: Diagnosis not present

## 2021-09-08 DIAGNOSIS — G4733 Obstructive sleep apnea (adult) (pediatric): Secondary | ICD-10-CM | POA: Diagnosis not present

## 2021-09-08 DIAGNOSIS — M1991 Primary osteoarthritis, unspecified site: Secondary | ICD-10-CM | POA: Diagnosis not present

## 2021-09-08 DIAGNOSIS — S22080S Wedge compression fracture of T11-T12 vertebra, sequela: Secondary | ICD-10-CM | POA: Diagnosis not present

## 2021-09-08 DIAGNOSIS — I10 Essential (primary) hypertension: Secondary | ICD-10-CM | POA: Diagnosis not present

## 2021-09-08 DIAGNOSIS — S42211D Unspecified displaced fracture of surgical neck of right humerus, subsequent encounter for fracture with routine healing: Secondary | ICD-10-CM | POA: Diagnosis not present

## 2021-09-08 DIAGNOSIS — E039 Hypothyroidism, unspecified: Secondary | ICD-10-CM | POA: Diagnosis not present

## 2021-09-09 ENCOUNTER — Telehealth: Payer: Self-pay | Admitting: Family Medicine

## 2021-09-09 NOTE — Telephone Encounter (Signed)
Spoke to patient's daughter - she is not aware that patient had called as she is currently in a nursing home - patient is having dental procedure next week and wants to hold off on Prolia injection until after - patient informed nursing home staff and declined injection when they brought it to her. Daughter will talk to patient and see if there is anything they need form [place] and she will call back as needed.

## 2021-09-13 DIAGNOSIS — M1991 Primary osteoarthritis, unspecified site: Secondary | ICD-10-CM | POA: Diagnosis not present

## 2021-09-13 DIAGNOSIS — S22088D Other fracture of T11-T12 vertebra, subsequent encounter for fracture with routine healing: Secondary | ICD-10-CM | POA: Diagnosis not present

## 2021-09-13 DIAGNOSIS — D649 Anemia, unspecified: Secondary | ICD-10-CM | POA: Diagnosis not present

## 2021-09-13 DIAGNOSIS — N3281 Overactive bladder: Secondary | ICD-10-CM | POA: Diagnosis not present

## 2021-09-13 DIAGNOSIS — E039 Hypothyroidism, unspecified: Secondary | ICD-10-CM | POA: Diagnosis not present

## 2021-09-13 DIAGNOSIS — Z87891 Personal history of nicotine dependence: Secondary | ICD-10-CM | POA: Diagnosis not present

## 2021-09-13 DIAGNOSIS — E785 Hyperlipidemia, unspecified: Secondary | ICD-10-CM | POA: Diagnosis not present

## 2021-09-13 DIAGNOSIS — F32A Depression, unspecified: Secondary | ICD-10-CM | POA: Diagnosis not present

## 2021-09-13 DIAGNOSIS — Z993 Dependence on wheelchair: Secondary | ICD-10-CM | POA: Diagnosis not present

## 2021-09-13 DIAGNOSIS — J9602 Acute respiratory failure with hypercapnia: Secondary | ICD-10-CM | POA: Diagnosis not present

## 2021-09-13 DIAGNOSIS — I10 Essential (primary) hypertension: Secondary | ICD-10-CM | POA: Diagnosis not present

## 2021-09-13 DIAGNOSIS — E559 Vitamin D deficiency, unspecified: Secondary | ICD-10-CM | POA: Diagnosis not present

## 2021-09-13 DIAGNOSIS — S42211D Unspecified displaced fracture of surgical neck of right humerus, subsequent encounter for fracture with routine healing: Secondary | ICD-10-CM | POA: Diagnosis not present

## 2021-09-13 DIAGNOSIS — S22078D Other fracture of T9-T10 vertebra, subsequent encounter for fracture with routine healing: Secondary | ICD-10-CM | POA: Diagnosis not present

## 2021-09-13 DIAGNOSIS — Z9181 History of falling: Secondary | ICD-10-CM | POA: Diagnosis not present

## 2021-09-13 DIAGNOSIS — S52501D Unspecified fracture of the lower end of right radius, subsequent encounter for closed fracture with routine healing: Secondary | ICD-10-CM | POA: Diagnosis not present

## 2021-09-13 DIAGNOSIS — G4733 Obstructive sleep apnea (adult) (pediatric): Secondary | ICD-10-CM | POA: Diagnosis not present

## 2021-09-13 DIAGNOSIS — E538 Deficiency of other specified B group vitamins: Secondary | ICD-10-CM | POA: Diagnosis not present

## 2021-09-13 DIAGNOSIS — M549 Dorsalgia, unspecified: Secondary | ICD-10-CM | POA: Diagnosis not present

## 2021-09-13 DIAGNOSIS — G8929 Other chronic pain: Secondary | ICD-10-CM | POA: Diagnosis not present

## 2021-09-13 DIAGNOSIS — Z87440 Personal history of urinary (tract) infections: Secondary | ICD-10-CM | POA: Diagnosis not present

## 2021-09-13 DIAGNOSIS — J9601 Acute respiratory failure with hypoxia: Secondary | ICD-10-CM | POA: Diagnosis not present

## 2021-09-14 ENCOUNTER — Ambulatory Visit (INDEPENDENT_AMBULATORY_CARE_PROVIDER_SITE_OTHER): Payer: Medicare Other | Admitting: Family Medicine

## 2021-09-14 ENCOUNTER — Other Ambulatory Visit: Payer: Self-pay | Admitting: Family Medicine

## 2021-09-14 ENCOUNTER — Encounter: Payer: Self-pay | Admitting: Family Medicine

## 2021-09-14 ENCOUNTER — Other Ambulatory Visit: Payer: Self-pay

## 2021-09-14 VITALS — BP 161/88 | HR 87 | Ht 63.0 in | Wt 181.0 lb

## 2021-09-14 DIAGNOSIS — S42121D Displaced fracture of acromial process, right shoulder, subsequent encounter for fracture with routine healing: Secondary | ICD-10-CM

## 2021-09-14 DIAGNOSIS — Z09 Encounter for follow-up examination after completed treatment for conditions other than malignant neoplasm: Secondary | ICD-10-CM | POA: Diagnosis not present

## 2021-09-14 DIAGNOSIS — M419 Scoliosis, unspecified: Secondary | ICD-10-CM

## 2021-09-14 MED ORDER — HYDROCODONE-ACETAMINOPHEN 5-325 MG PO TABS: 1.0000 | ORAL_TABLET | Freq: Every day | ORAL | 0 refills | Status: DC | PRN

## 2021-09-14 NOTE — Progress Notes (Addendum)
BP (!) 161/88   Pulse 87   Ht '5\' 3"'  (1.6 m)   Wt 181 lb (82.1 kg)   SpO2 94%   BMI 32.06 kg/m    Subjective:   Patient ID: Danielle Parrish, female    DOB: 07/09/1941, 80 y.o.   MRN: [id number]  HPI: Danielle Parrish is a 80 y.o. female presenting on 09/14/2021 for [place] follow up [facility] in [place]. Fell Aug 13, crushed right shoulder, surgery Aug 24th. Stayed in rehab for about 1.5 weeks)   HPI Shoulder fracture and hospital /fu Patient is coming in today for shoulder fracture hospital follow-up.  She fell on 08/06/2021 and fractured her right shoulder and had surgery on 08/17/2021.  She stayed in rehab for about a week and a half has been has been out and does have follow-up with orthopedic but have not allowed her to do rehab at this point yet.  She says she has not had any further following.  Relevant past medical, surgical, family and social history reviewed and updated as indicated. Interim medical history since our last visit reviewed. Allergies and medications reviewed and updated.  Review of Systems  Constitutional:  Negative for chills and fever.  Eyes:  Negative for visual disturbance.  Respiratory:  Negative for chest tightness and shortness of breath.   Cardiovascular:  Negative for chest pain and leg swelling.  Musculoskeletal:  Positive for arthralgias and myalgias. Negative for back pain, gait problem and joint swelling.  Skin:  Negative for rash.  Neurological:  Negative for light-headedness and headaches.  Psychiatric/Behavioral:  Negative for agitation and behavioral problems.   All other systems reviewed and are negative.  Per HPI unless specifically indicated above   Allergies as of 09/14/2021       Reactions   Crestor [rosuvastatin] Other (See Comments)   Body aches.    Nsaids    Burns stomach.    Vytorin [ezetimibe-simvastatin]    Body aches.    Aspirin Nausea Only   Tolmetin Nausea Only   Burns stomach.          Medication List        Accurate as of September 14, 2021  2:42 PM. If you have any questions, ask your nurse or doctor.          acetaminophen 500 MG tablet Commonly known as: TYLENOL Take 1 tablet (500 mg total) by mouth every 6 (six) hours as needed.   albuterol 108 (90 Base) MCG/ACT inhaler Commonly known as: VENTOLIN HFA TAKE 2 PUFFS BY MOUTH EVERY 6 HOURS AS NEEDED FOR WHEEZE OR SHORTNESS OF BREATH   amLODipine 10 MG tablet Commonly known as: NORVASC Take 1 tablet (10 mg total) by mouth daily.   calcium-vitamin D 500-200 MG-UNIT Tabs tablet Commonly known as: OSCAL WITH D TAKE 1 TABLET BY MOUTH EVERY DAY WITH BREAKFAST   cyclobenzaprine 5 MG tablet Commonly known as: FLEXERIL Take 1 tablet (5 mg total) by mouth 3 (three) times daily as needed for muscle spasms.   diclofenac Sodium 1 % Gel Commonly known as: VOLTAREN APPLY 4 GRAMS TOPICALLY 4 (FOUR) TIMES DAILY.   diphenhydrAMINE 25 MG tablet Commonly known as: BENADRYL Take 50 mg by mouth at bedtime as needed for allergies.   gabapentin 300 MG capsule Commonly known as: NEURONTIN Take 300 mg by mouth 3 (three) times daily.   HYDROcodone-acetaminophen 5-325 MG tablet Commonly known as: NORCO/VICODIN Take 1 tablet by mouth every 6 (six) hours as  needed for moderate pain.   ketoconazole 2 % cream Commonly known as: NIZORAL Apply to affected area everyday   levothyroxine 100 MCG tablet Commonly known as: SYNTHROID Take 1 tablet (100 mcg total) by mouth daily before breakfast.   lidocaine 5 % Commonly known as: LIDODERM PLACE 1 PATCH ONTO THE SKIN DAILY. REMOVE & DISCARD PATCH WITHIN 12 HOURS OR AS DIRECTED BY MD   lisinopril 20 MG tablet Commonly known as: ZESTRIL Take 1 tablet (20 mg total) by mouth daily.   lovastatin 40 MG tablet Commonly known as: MEVACOR TAKE 1 TABLET BY MOUTH EVERYDAY AT BEDTIME   mirabegron ER 25 MG Tb24 tablet Commonly known as: Myrbetriq Take 1 tablet (25 mg total) by  mouth daily.   Multi-Vitamin tablet Take by mouth.   nitrofurantoin (macrocrystal-monohydrate) 100 MG capsule Commonly known as: MACROBID Take 100 mg by mouth daily.   nystatin powder Commonly known as: MYCOSTATIN/NYSTOP Apply topically 4 (four) times daily.   nystatin cream Commonly known as: MYCOSTATIN Apply 1 application topically 2 (two) times daily.   omeprazole 20 MG capsule Commonly known as: PRILOSEC Take 1 capsule (20 mg total) by mouth 2 (two) times daily before a meal.   PRESERVISION AREDS 2 PO Take by mouth.   denosumab 60 MG/ML Sosy injection Commonly known as: PROLIA Bring to your doctor to inject 60 mg subcutaneously.   Prolia 60 MG/ML Sosy injection Generic drug: denosumab TO BE ADMINISTERED IN PHYSICIAN'S OFFICE. INJECT ONE SYRINGE SUBCOUSLY ONCE EVERY 6 MONTHS. REFRIGERATE. USE WITHIN 14 DAYS ONCE AT ROOM TEMPERATURE.   pyridOXINE 100 MG tablet Commonly known as: VITAMIN B-6 Take 100 mg by mouth daily.   sertraline 100 MG tablet Commonly known as: ZOLOFT Take 2 tablets (200 mg total) by mouth daily.   sodium chloride 0.65 % Soln nasal spray Commonly known as: OCEAN Place 1 spray into both nostrils as needed for congestion.   VITAMIN D-3 PO Take 1 capsule by mouth daily.         Objective:   BP (!) 161/88   Pulse 87   Ht '5\' 3"'  (1.6 m)   Wt 181 lb (82.1 kg)   SpO2 94%   BMI 32.06 kg/m   Wt Readings from Last 3 Encounters:  09/14/21 181 lb (82.1 kg)  08/05/21 182 lb (82.6 kg)  08/01/21 183 lb (83 kg)    Physical Exam Vitals and nursing note reviewed.  Constitutional:      General: She is not in acute distress.    Appearance: She is well-developed. She is not diaphoretic.  Eyes:     Conjunctiva/sclera: Conjunctivae normal.  Cardiovascular:     Rate and Rhythm: Normal rate and regular rhythm.     Heart sounds: Normal heart sounds. No murmur heard. Pulmonary:     Effort: Pulmonary effort is normal. No respiratory distress.      Breath sounds: Normal breath sounds. No wheezing.  Neurological:     Mental Status: She is alert and oriented to person, place, and time.     Coordination: Coordination normal.  Psychiatric:        Behavior: Behavior normal.      Assessment & Plan:   Problem List Items Addressed This Visit   None Visit Diagnoses     Displaced fracture of acromial process, right shoulder, subsequent encounter for fracture with routine healing    -  Primary   Relevant Orders   CBC with Differential/Platelet   CMP14+EGFR  Patient has follow-up with orthopedic, will do blood work on the way out and follow-up after rehab.  Of note patient had mild scoliosis on an x-ray in 2016 Follow up plan: Return if symptoms worsen or fail to improve.  Counseling provided for all of the vaccine components Orders Placed This Encounter  Procedures   CBC with Differential/Platelet   [place] Josimar Corning, MD [facility] 09/14/2021, 2:42 PM

## 2021-09-14 NOTE — Addendum Note (Signed)
Addended by: Caryl Pina on: 09/14/2021 02:55 PM   Modules accepted: Orders

## 2021-09-15 DIAGNOSIS — M25531 Pain in right wrist: Secondary | ICD-10-CM | POA: Diagnosis not present

## 2021-09-15 DIAGNOSIS — Z4789 Encounter for other orthopedic aftercare: Secondary | ICD-10-CM | POA: Diagnosis not present

## 2021-09-15 LAB — CBC WITH DIFFERENTIAL/PLATELET
Basophils Absolute: 0 10*3/uL (ref 0.0–0.2)
Basos: 1 %
EOS (ABSOLUTE): 0.1 10*3/uL (ref 0.0–0.4)
Eos: 2 %
Hematocrit: 39.3 % (ref 34.0–46.6)
Hemoglobin: 12.5 g/dL (ref 11.1–15.9)
Immature Grans (Abs): 0 10*3/uL (ref 0.0–0.1)
Immature Granulocytes: 0 %
Lymphocytes Absolute: 1.9 10*3/uL (ref 0.7–3.1)
Lymphs: 37 %
MCH: 31.2 pg (ref 26.6–33.0)
MCHC: 31.8 g/dL (ref 31.5–35.7)
MCV: 98 fL — ABNORMAL HIGH (ref 79–97)
Monocytes Absolute: 0.5 10*3/uL (ref 0.1–0.9)
Monocytes: 10 %
Neutrophils Absolute: 2.5 10*3/uL (ref 1.4–7.0)
Neutrophils: 50 %
Platelets: 211 10*3/uL (ref 150–450)
RBC: 4.01 x10E6/uL (ref 3.77–5.28)
RDW: 12.1 % (ref 11.7–15.4)
WBC: 5.1 10*3/uL (ref 3.4–10.8)

## 2021-09-15 LAB — CMP14+EGFR
ALT: 12 IU/L (ref 0–32)
AST: 12 IU/L (ref 0–40)
Albumin/Globulin Ratio: 1.8 (ref 1.2–2.2)
Albumin: 4.4 g/dL (ref 3.7–4.7)
Alkaline Phosphatase: 107 IU/L (ref 44–121)
BUN/Creatinine Ratio: 12 (ref 12–28)
BUN: 7 mg/dL — ABNORMAL LOW (ref 8–27)
Bilirubin Total: 0.3 mg/dL (ref 0.0–1.2)
CO2: 28 mmol/L (ref 20–29)
Calcium: 10.6 mg/dL — ABNORMAL HIGH (ref 8.7–10.3)
Chloride: 96 mmol/L (ref 96–106)
Creatinine, Ser: 0.58 mg/dL (ref 0.57–1.00)
Globulin, Total: 2.4 g/dL (ref 1.5–4.5)
Glucose: 103 mg/dL — ABNORMAL HIGH (ref 65–99)
Potassium: 4.1 mmol/L (ref 3.5–5.2)
Sodium: 138 mmol/L (ref 134–144)
Total Protein: 6.8 g/dL (ref 6.0–8.5)
eGFR: 91 mL/min/{1.73_m2} (ref >59)

## 2021-09-16 DIAGNOSIS — Z87891 Personal history of nicotine dependence: Secondary | ICD-10-CM | POA: Diagnosis not present

## 2021-09-16 DIAGNOSIS — Z87440 Personal history of urinary (tract) infections: Secondary | ICD-10-CM | POA: Diagnosis not present

## 2021-09-16 DIAGNOSIS — Z993 Dependence on wheelchair: Secondary | ICD-10-CM | POA: Diagnosis not present

## 2021-09-16 DIAGNOSIS — J9602 Acute respiratory failure with hypercapnia: Secondary | ICD-10-CM | POA: Diagnosis not present

## 2021-09-16 DIAGNOSIS — E559 Vitamin D deficiency, unspecified: Secondary | ICD-10-CM | POA: Diagnosis not present

## 2021-09-16 DIAGNOSIS — E538 Deficiency of other specified B group vitamins: Secondary | ICD-10-CM | POA: Diagnosis not present

## 2021-09-16 DIAGNOSIS — M549 Dorsalgia, unspecified: Secondary | ICD-10-CM | POA: Diagnosis not present

## 2021-09-16 DIAGNOSIS — S22088D Other fracture of T11-T12 vertebra, subsequent encounter for fracture with routine healing: Secondary | ICD-10-CM | POA: Diagnosis not present

## 2021-09-16 DIAGNOSIS — J9601 Acute respiratory failure with hypoxia: Secondary | ICD-10-CM | POA: Diagnosis not present

## 2021-09-16 DIAGNOSIS — G8929 Other chronic pain: Secondary | ICD-10-CM | POA: Diagnosis not present

## 2021-09-16 DIAGNOSIS — S52501D Unspecified fracture of the lower end of right radius, subsequent encounter for closed fracture with routine healing: Secondary | ICD-10-CM | POA: Diagnosis not present

## 2021-09-16 DIAGNOSIS — I10 Essential (primary) hypertension: Secondary | ICD-10-CM | POA: Diagnosis not present

## 2021-09-16 DIAGNOSIS — Z9181 History of falling: Secondary | ICD-10-CM | POA: Diagnosis not present

## 2021-09-16 DIAGNOSIS — E039 Hypothyroidism, unspecified: Secondary | ICD-10-CM | POA: Diagnosis not present

## 2021-09-16 DIAGNOSIS — M1991 Primary osteoarthritis, unspecified site: Secondary | ICD-10-CM | POA: Diagnosis not present

## 2021-09-16 DIAGNOSIS — E785 Hyperlipidemia, unspecified: Secondary | ICD-10-CM | POA: Diagnosis not present

## 2021-09-16 DIAGNOSIS — F32A Depression, unspecified: Secondary | ICD-10-CM | POA: Diagnosis not present

## 2021-09-16 DIAGNOSIS — G4733 Obstructive sleep apnea (adult) (pediatric): Secondary | ICD-10-CM | POA: Diagnosis not present

## 2021-09-16 DIAGNOSIS — N3281 Overactive bladder: Secondary | ICD-10-CM | POA: Diagnosis not present

## 2021-09-16 DIAGNOSIS — S42211D Unspecified displaced fracture of surgical neck of right humerus, subsequent encounter for fracture with routine healing: Secondary | ICD-10-CM | POA: Diagnosis not present

## 2021-09-16 DIAGNOSIS — S22078D Other fracture of T9-T10 vertebra, subsequent encounter for fracture with routine healing: Secondary | ICD-10-CM | POA: Diagnosis not present

## 2021-09-16 DIAGNOSIS — D649 Anemia, unspecified: Secondary | ICD-10-CM | POA: Diagnosis not present

## 2021-09-20 DIAGNOSIS — E785 Hyperlipidemia, unspecified: Secondary | ICD-10-CM | POA: Diagnosis not present

## 2021-09-20 DIAGNOSIS — Z993 Dependence on wheelchair: Secondary | ICD-10-CM | POA: Diagnosis not present

## 2021-09-20 DIAGNOSIS — M549 Dorsalgia, unspecified: Secondary | ICD-10-CM | POA: Diagnosis not present

## 2021-09-20 DIAGNOSIS — S52501D Unspecified fracture of the lower end of right radius, subsequent encounter for closed fracture with routine healing: Secondary | ICD-10-CM | POA: Diagnosis not present

## 2021-09-20 DIAGNOSIS — S22078D Other fracture of T9-T10 vertebra, subsequent encounter for fracture with routine healing: Secondary | ICD-10-CM | POA: Diagnosis not present

## 2021-09-20 DIAGNOSIS — M1991 Primary osteoarthritis, unspecified site: Secondary | ICD-10-CM | POA: Diagnosis not present

## 2021-09-20 DIAGNOSIS — J9602 Acute respiratory failure with hypercapnia: Secondary | ICD-10-CM | POA: Diagnosis not present

## 2021-09-20 DIAGNOSIS — G4733 Obstructive sleep apnea (adult) (pediatric): Secondary | ICD-10-CM | POA: Diagnosis not present

## 2021-09-20 DIAGNOSIS — J9601 Acute respiratory failure with hypoxia: Secondary | ICD-10-CM | POA: Diagnosis not present

## 2021-09-20 DIAGNOSIS — E538 Deficiency of other specified B group vitamins: Secondary | ICD-10-CM | POA: Diagnosis not present

## 2021-09-20 DIAGNOSIS — Z9181 History of falling: Secondary | ICD-10-CM | POA: Diagnosis not present

## 2021-09-20 DIAGNOSIS — G8929 Other chronic pain: Secondary | ICD-10-CM | POA: Diagnosis not present

## 2021-09-20 DIAGNOSIS — Z87440 Personal history of urinary (tract) infections: Secondary | ICD-10-CM | POA: Diagnosis not present

## 2021-09-20 DIAGNOSIS — Z87891 Personal history of nicotine dependence: Secondary | ICD-10-CM | POA: Diagnosis not present

## 2021-09-20 DIAGNOSIS — N3281 Overactive bladder: Secondary | ICD-10-CM | POA: Diagnosis not present

## 2021-09-20 DIAGNOSIS — S22088D Other fracture of T11-T12 vertebra, subsequent encounter for fracture with routine healing: Secondary | ICD-10-CM | POA: Diagnosis not present

## 2021-09-20 DIAGNOSIS — I10 Essential (primary) hypertension: Secondary | ICD-10-CM | POA: Diagnosis not present

## 2021-09-20 DIAGNOSIS — E039 Hypothyroidism, unspecified: Secondary | ICD-10-CM | POA: Diagnosis not present

## 2021-09-20 DIAGNOSIS — F32A Depression, unspecified: Secondary | ICD-10-CM | POA: Diagnosis not present

## 2021-09-20 DIAGNOSIS — D649 Anemia, unspecified: Secondary | ICD-10-CM | POA: Diagnosis not present

## 2021-09-20 DIAGNOSIS — E559 Vitamin D deficiency, unspecified: Secondary | ICD-10-CM | POA: Diagnosis not present

## 2021-09-20 DIAGNOSIS — S42211D Unspecified displaced fracture of surgical neck of right humerus, subsequent encounter for fracture with routine healing: Secondary | ICD-10-CM | POA: Diagnosis not present

## 2021-09-22 ENCOUNTER — Other Ambulatory Visit: Payer: Self-pay | Admitting: Family Medicine

## 2021-09-22 ENCOUNTER — Telehealth: Payer: Self-pay | Admitting: Family Medicine

## 2021-09-22 DIAGNOSIS — E538 Deficiency of other specified B group vitamins: Secondary | ICD-10-CM | POA: Diagnosis not present

## 2021-09-22 DIAGNOSIS — M1991 Primary osteoarthritis, unspecified site: Secondary | ICD-10-CM | POA: Diagnosis not present

## 2021-09-22 DIAGNOSIS — J9601 Acute respiratory failure with hypoxia: Secondary | ICD-10-CM | POA: Diagnosis not present

## 2021-09-22 DIAGNOSIS — N3281 Overactive bladder: Secondary | ICD-10-CM | POA: Diagnosis not present

## 2021-09-22 DIAGNOSIS — F32A Depression, unspecified: Secondary | ICD-10-CM | POA: Diagnosis not present

## 2021-09-22 DIAGNOSIS — I152 Hypertension secondary to endocrine disorders: Secondary | ICD-10-CM

## 2021-09-22 DIAGNOSIS — S52501D Unspecified fracture of the lower end of right radius, subsequent encounter for closed fracture with routine healing: Secondary | ICD-10-CM | POA: Diagnosis not present

## 2021-09-22 DIAGNOSIS — I10 Essential (primary) hypertension: Secondary | ICD-10-CM | POA: Diagnosis not present

## 2021-09-22 DIAGNOSIS — Z87891 Personal history of nicotine dependence: Secondary | ICD-10-CM | POA: Diagnosis not present

## 2021-09-22 DIAGNOSIS — Z9181 History of falling: Secondary | ICD-10-CM | POA: Diagnosis not present

## 2021-09-22 DIAGNOSIS — J9602 Acute respiratory failure with hypercapnia: Secondary | ICD-10-CM | POA: Diagnosis not present

## 2021-09-22 DIAGNOSIS — E559 Vitamin D deficiency, unspecified: Secondary | ICD-10-CM | POA: Diagnosis not present

## 2021-09-22 DIAGNOSIS — Z87440 Personal history of urinary (tract) infections: Secondary | ICD-10-CM | POA: Diagnosis not present

## 2021-09-22 DIAGNOSIS — S22078D Other fracture of T9-T10 vertebra, subsequent encounter for fracture with routine healing: Secondary | ICD-10-CM | POA: Diagnosis not present

## 2021-09-22 DIAGNOSIS — E1159 Type 2 diabetes mellitus with other circulatory complications: Secondary | ICD-10-CM

## 2021-09-22 DIAGNOSIS — D649 Anemia, unspecified: Secondary | ICD-10-CM | POA: Diagnosis not present

## 2021-09-22 DIAGNOSIS — M549 Dorsalgia, unspecified: Secondary | ICD-10-CM | POA: Diagnosis not present

## 2021-09-22 DIAGNOSIS — G4733 Obstructive sleep apnea (adult) (pediatric): Secondary | ICD-10-CM | POA: Diagnosis not present

## 2021-09-22 DIAGNOSIS — S42211D Unspecified displaced fracture of surgical neck of right humerus, subsequent encounter for fracture with routine healing: Secondary | ICD-10-CM | POA: Diagnosis not present

## 2021-09-22 DIAGNOSIS — G8929 Other chronic pain: Secondary | ICD-10-CM | POA: Diagnosis not present

## 2021-09-22 DIAGNOSIS — E785 Hyperlipidemia, unspecified: Secondary | ICD-10-CM | POA: Diagnosis not present

## 2021-09-22 DIAGNOSIS — S22088D Other fracture of T11-T12 vertebra, subsequent encounter for fracture with routine healing: Secondary | ICD-10-CM | POA: Diagnosis not present

## 2021-09-22 DIAGNOSIS — E039 Hypothyroidism, unspecified: Secondary | ICD-10-CM | POA: Diagnosis not present

## 2021-09-22 DIAGNOSIS — Z993 Dependence on wheelchair: Secondary | ICD-10-CM | POA: Diagnosis not present

## 2021-09-22 MED ORDER — METOPROLOL SUCCINATE ER 25 MG PO TB24: 25.0000 mg | ORAL_TABLET | Freq: Every day | ORAL | 3 refills | Status: DC

## 2021-09-22 NOTE — Progress Notes (Signed)
Please let her know that I have sent in a new medicine, metoprolol for her, it is once a day and it should help with the blood pressure and then monitor it closely and check it daily or every other day over the next 2 weeks and call me in some numbers Caryl Pina, MD [place] 09/22/2021, 2:31 PM

## 2021-09-22 NOTE — Telephone Encounter (Signed)
Nurse from [facility] is at patients home and her blood pressure is running 180/72.  Please advise.

## 2021-09-23 NOTE — Telephone Encounter (Signed)
Sarah from [place] calling back about pt because she had not heard back. She stated that the pt say her physical therapist after Judson Roch had her visit with the pt and at that time her bp was normal. She thinks that the reason it was high during her visit was because of her being a new face and the pt was in pain. Judson Roch still needs to follow up with pt's pcp. Please call back.

## 2021-09-23 NOTE — Telephone Encounter (Signed)
Informed Sarah that Dr. Warrick Parisian sent in Metoprolol 25mg  daily yesterday. She is aware that pt has been notified and will call back with an update after 2 weeks.

## 2021-09-23 NOTE — Progress Notes (Signed)
Pt has been informed and understood. She will call back with an update on BP readings

## 2021-09-26 ENCOUNTER — Other Ambulatory Visit: Payer: Self-pay | Admitting: Nurse Practitioner

## 2021-09-26 ENCOUNTER — Other Ambulatory Visit: Payer: Self-pay | Admitting: Family Medicine

## 2021-09-26 DIAGNOSIS — N3281 Overactive bladder: Secondary | ICD-10-CM | POA: Diagnosis not present

## 2021-09-26 DIAGNOSIS — Z9181 History of falling: Secondary | ICD-10-CM | POA: Diagnosis not present

## 2021-09-26 DIAGNOSIS — D649 Anemia, unspecified: Secondary | ICD-10-CM | POA: Diagnosis not present

## 2021-09-26 DIAGNOSIS — Z993 Dependence on wheelchair: Secondary | ICD-10-CM | POA: Diagnosis not present

## 2021-09-26 DIAGNOSIS — B3749 Other urogenital candidiasis: Secondary | ICD-10-CM

## 2021-09-26 DIAGNOSIS — E559 Vitamin D deficiency, unspecified: Secondary | ICD-10-CM | POA: Diagnosis not present

## 2021-09-26 DIAGNOSIS — S22078D Other fracture of T9-T10 vertebra, subsequent encounter for fracture with routine healing: Secondary | ICD-10-CM | POA: Diagnosis not present

## 2021-09-26 DIAGNOSIS — M1991 Primary osteoarthritis, unspecified site: Secondary | ICD-10-CM | POA: Diagnosis not present

## 2021-09-26 DIAGNOSIS — G8929 Other chronic pain: Secondary | ICD-10-CM | POA: Diagnosis not present

## 2021-09-26 DIAGNOSIS — M549 Dorsalgia, unspecified: Secondary | ICD-10-CM | POA: Diagnosis not present

## 2021-09-26 DIAGNOSIS — F32A Depression, unspecified: Secondary | ICD-10-CM | POA: Diagnosis not present

## 2021-09-26 DIAGNOSIS — J9601 Acute respiratory failure with hypoxia: Secondary | ICD-10-CM | POA: Diagnosis not present

## 2021-09-26 DIAGNOSIS — Z87440 Personal history of urinary (tract) infections: Secondary | ICD-10-CM | POA: Diagnosis not present

## 2021-09-26 DIAGNOSIS — S22088D Other fracture of T11-T12 vertebra, subsequent encounter for fracture with routine healing: Secondary | ICD-10-CM | POA: Diagnosis not present

## 2021-09-26 DIAGNOSIS — M545 Low back pain, unspecified: Secondary | ICD-10-CM

## 2021-09-26 DIAGNOSIS — E538 Deficiency of other specified B group vitamins: Secondary | ICD-10-CM | POA: Diagnosis not present

## 2021-09-26 DIAGNOSIS — E785 Hyperlipidemia, unspecified: Secondary | ICD-10-CM | POA: Diagnosis not present

## 2021-09-26 DIAGNOSIS — J9602 Acute respiratory failure with hypercapnia: Secondary | ICD-10-CM | POA: Diagnosis not present

## 2021-09-26 DIAGNOSIS — E039 Hypothyroidism, unspecified: Secondary | ICD-10-CM | POA: Diagnosis not present

## 2021-09-26 DIAGNOSIS — G4733 Obstructive sleep apnea (adult) (pediatric): Secondary | ICD-10-CM | POA: Diagnosis not present

## 2021-09-26 DIAGNOSIS — I10 Essential (primary) hypertension: Secondary | ICD-10-CM | POA: Diagnosis not present

## 2021-09-26 DIAGNOSIS — S42211D Unspecified displaced fracture of surgical neck of right humerus, subsequent encounter for fracture with routine healing: Secondary | ICD-10-CM | POA: Diagnosis not present

## 2021-09-26 DIAGNOSIS — S52501D Unspecified fracture of the lower end of right radius, subsequent encounter for closed fracture with routine healing: Secondary | ICD-10-CM | POA: Diagnosis not present

## 2021-09-26 DIAGNOSIS — Z87891 Personal history of nicotine dependence: Secondary | ICD-10-CM | POA: Diagnosis not present

## 2021-09-29 ENCOUNTER — Telehealth: Payer: Self-pay | Admitting: *Deleted

## 2021-09-29 DIAGNOSIS — Z87440 Personal history of urinary (tract) infections: Secondary | ICD-10-CM | POA: Diagnosis not present

## 2021-09-29 DIAGNOSIS — Z993 Dependence on wheelchair: Secondary | ICD-10-CM | POA: Diagnosis not present

## 2021-09-29 DIAGNOSIS — Z87891 Personal history of nicotine dependence: Secondary | ICD-10-CM | POA: Diagnosis not present

## 2021-09-29 DIAGNOSIS — E559 Vitamin D deficiency, unspecified: Secondary | ICD-10-CM | POA: Diagnosis not present

## 2021-09-29 DIAGNOSIS — S52501D Unspecified fracture of the lower end of right radius, subsequent encounter for closed fracture with routine healing: Secondary | ICD-10-CM | POA: Diagnosis not present

## 2021-09-29 DIAGNOSIS — S22078D Other fracture of T9-T10 vertebra, subsequent encounter for fracture with routine healing: Secondary | ICD-10-CM | POA: Diagnosis not present

## 2021-09-29 DIAGNOSIS — G4733 Obstructive sleep apnea (adult) (pediatric): Secondary | ICD-10-CM | POA: Diagnosis not present

## 2021-09-29 DIAGNOSIS — J9602 Acute respiratory failure with hypercapnia: Secondary | ICD-10-CM | POA: Diagnosis not present

## 2021-09-29 DIAGNOSIS — Z9181 History of falling: Secondary | ICD-10-CM | POA: Diagnosis not present

## 2021-09-29 DIAGNOSIS — D649 Anemia, unspecified: Secondary | ICD-10-CM | POA: Diagnosis not present

## 2021-09-29 DIAGNOSIS — S42211D Unspecified displaced fracture of surgical neck of right humerus, subsequent encounter for fracture with routine healing: Secondary | ICD-10-CM | POA: Diagnosis not present

## 2021-09-29 DIAGNOSIS — M1991 Primary osteoarthritis, unspecified site: Secondary | ICD-10-CM | POA: Diagnosis not present

## 2021-09-29 DIAGNOSIS — M549 Dorsalgia, unspecified: Secondary | ICD-10-CM | POA: Diagnosis not present

## 2021-09-29 DIAGNOSIS — E538 Deficiency of other specified B group vitamins: Secondary | ICD-10-CM | POA: Diagnosis not present

## 2021-09-29 DIAGNOSIS — E039 Hypothyroidism, unspecified: Secondary | ICD-10-CM | POA: Diagnosis not present

## 2021-09-29 DIAGNOSIS — N3281 Overactive bladder: Secondary | ICD-10-CM | POA: Diagnosis not present

## 2021-09-29 DIAGNOSIS — I10 Essential (primary) hypertension: Secondary | ICD-10-CM | POA: Diagnosis not present

## 2021-09-29 DIAGNOSIS — G8929 Other chronic pain: Secondary | ICD-10-CM | POA: Diagnosis not present

## 2021-09-29 DIAGNOSIS — F32A Depression, unspecified: Secondary | ICD-10-CM | POA: Diagnosis not present

## 2021-09-29 DIAGNOSIS — S22088D Other fracture of T11-T12 vertebra, subsequent encounter for fracture with routine healing: Secondary | ICD-10-CM | POA: Diagnosis not present

## 2021-09-29 DIAGNOSIS — E785 Hyperlipidemia, unspecified: Secondary | ICD-10-CM | POA: Diagnosis not present

## 2021-09-29 DIAGNOSIS — J9601 Acute respiratory failure with hypoxia: Secondary | ICD-10-CM | POA: Diagnosis not present

## 2021-09-29 NOTE — Telephone Encounter (Signed)
at this point the advice would be to start the metoprolol that was sent last week but she has not picked up yet.  Have her start that and then keep an eye on her blood pressures.

## 2021-09-29 NOTE — Telephone Encounter (Signed)
Call from Danielle Parrish OT w/ [facility] Pt's BP during visit today was 184/100 Pt has not been able to pick up Metoprolol from last week, pharmacy could not find it when she went and they were going to have to redo it, and she hasn't been able to get back there. She is not have any other symptoms Any advise or recommendations

## 2021-09-29 NOTE — Telephone Encounter (Signed)
SARA AWARE

## 2021-09-30 DIAGNOSIS — S22088D Other fracture of T11-T12 vertebra, subsequent encounter for fracture with routine healing: Secondary | ICD-10-CM | POA: Diagnosis not present

## 2021-09-30 DIAGNOSIS — Z9181 History of falling: Secondary | ICD-10-CM | POA: Diagnosis not present

## 2021-09-30 DIAGNOSIS — Z87440 Personal history of urinary (tract) infections: Secondary | ICD-10-CM | POA: Diagnosis not present

## 2021-09-30 DIAGNOSIS — E538 Deficiency of other specified B group vitamins: Secondary | ICD-10-CM | POA: Diagnosis not present

## 2021-09-30 DIAGNOSIS — D649 Anemia, unspecified: Secondary | ICD-10-CM | POA: Diagnosis not present

## 2021-09-30 DIAGNOSIS — S42211D Unspecified displaced fracture of surgical neck of right humerus, subsequent encounter for fracture with routine healing: Secondary | ICD-10-CM | POA: Diagnosis not present

## 2021-09-30 DIAGNOSIS — E559 Vitamin D deficiency, unspecified: Secondary | ICD-10-CM | POA: Diagnosis not present

## 2021-09-30 DIAGNOSIS — J9601 Acute respiratory failure with hypoxia: Secondary | ICD-10-CM | POA: Diagnosis not present

## 2021-09-30 DIAGNOSIS — G4733 Obstructive sleep apnea (adult) (pediatric): Secondary | ICD-10-CM | POA: Diagnosis not present

## 2021-09-30 DIAGNOSIS — I10 Essential (primary) hypertension: Secondary | ICD-10-CM | POA: Diagnosis not present

## 2021-09-30 DIAGNOSIS — J9602 Acute respiratory failure with hypercapnia: Secondary | ICD-10-CM | POA: Diagnosis not present

## 2021-09-30 DIAGNOSIS — F32A Depression, unspecified: Secondary | ICD-10-CM | POA: Diagnosis not present

## 2021-09-30 DIAGNOSIS — N3281 Overactive bladder: Secondary | ICD-10-CM | POA: Diagnosis not present

## 2021-09-30 DIAGNOSIS — S52501D Unspecified fracture of the lower end of right radius, subsequent encounter for closed fracture with routine healing: Secondary | ICD-10-CM | POA: Diagnosis not present

## 2021-09-30 DIAGNOSIS — M549 Dorsalgia, unspecified: Secondary | ICD-10-CM | POA: Diagnosis not present

## 2021-09-30 DIAGNOSIS — E039 Hypothyroidism, unspecified: Secondary | ICD-10-CM | POA: Diagnosis not present

## 2021-09-30 DIAGNOSIS — G8929 Other chronic pain: Secondary | ICD-10-CM | POA: Diagnosis not present

## 2021-09-30 DIAGNOSIS — M1991 Primary osteoarthritis, unspecified site: Secondary | ICD-10-CM | POA: Diagnosis not present

## 2021-09-30 DIAGNOSIS — E785 Hyperlipidemia, unspecified: Secondary | ICD-10-CM | POA: Diagnosis not present

## 2021-09-30 DIAGNOSIS — Z993 Dependence on wheelchair: Secondary | ICD-10-CM | POA: Diagnosis not present

## 2021-09-30 DIAGNOSIS — S22078D Other fracture of T9-T10 vertebra, subsequent encounter for fracture with routine healing: Secondary | ICD-10-CM | POA: Diagnosis not present

## 2021-09-30 DIAGNOSIS — Z87891 Personal history of nicotine dependence: Secondary | ICD-10-CM | POA: Diagnosis not present

## 2021-10-03 DIAGNOSIS — M1991 Primary osteoarthritis, unspecified site: Secondary | ICD-10-CM | POA: Diagnosis not present

## 2021-10-03 DIAGNOSIS — Z9181 History of falling: Secondary | ICD-10-CM | POA: Diagnosis not present

## 2021-10-03 DIAGNOSIS — G8929 Other chronic pain: Secondary | ICD-10-CM | POA: Diagnosis not present

## 2021-10-03 DIAGNOSIS — Z87440 Personal history of urinary (tract) infections: Secondary | ICD-10-CM | POA: Diagnosis not present

## 2021-10-03 DIAGNOSIS — Z87891 Personal history of nicotine dependence: Secondary | ICD-10-CM | POA: Diagnosis not present

## 2021-10-03 DIAGNOSIS — G4733 Obstructive sleep apnea (adult) (pediatric): Secondary | ICD-10-CM | POA: Diagnosis not present

## 2021-10-03 DIAGNOSIS — E538 Deficiency of other specified B group vitamins: Secondary | ICD-10-CM | POA: Diagnosis not present

## 2021-10-03 DIAGNOSIS — Z993 Dependence on wheelchair: Secondary | ICD-10-CM | POA: Diagnosis not present

## 2021-10-03 DIAGNOSIS — J9601 Acute respiratory failure with hypoxia: Secondary | ICD-10-CM | POA: Diagnosis not present

## 2021-10-03 DIAGNOSIS — I10 Essential (primary) hypertension: Secondary | ICD-10-CM | POA: Diagnosis not present

## 2021-10-03 DIAGNOSIS — E039 Hypothyroidism, unspecified: Secondary | ICD-10-CM | POA: Diagnosis not present

## 2021-10-03 DIAGNOSIS — E785 Hyperlipidemia, unspecified: Secondary | ICD-10-CM | POA: Diagnosis not present

## 2021-10-03 DIAGNOSIS — N3281 Overactive bladder: Secondary | ICD-10-CM | POA: Diagnosis not present

## 2021-10-03 DIAGNOSIS — S22088D Other fracture of T11-T12 vertebra, subsequent encounter for fracture with routine healing: Secondary | ICD-10-CM | POA: Diagnosis not present

## 2021-10-03 DIAGNOSIS — F32A Depression, unspecified: Secondary | ICD-10-CM | POA: Diagnosis not present

## 2021-10-03 DIAGNOSIS — S42211D Unspecified displaced fracture of surgical neck of right humerus, subsequent encounter for fracture with routine healing: Secondary | ICD-10-CM | POA: Diagnosis not present

## 2021-10-03 DIAGNOSIS — D649 Anemia, unspecified: Secondary | ICD-10-CM | POA: Diagnosis not present

## 2021-10-03 DIAGNOSIS — J9602 Acute respiratory failure with hypercapnia: Secondary | ICD-10-CM | POA: Diagnosis not present

## 2021-10-03 DIAGNOSIS — S52501D Unspecified fracture of the lower end of right radius, subsequent encounter for closed fracture with routine healing: Secondary | ICD-10-CM | POA: Diagnosis not present

## 2021-10-03 DIAGNOSIS — S22078D Other fracture of T9-T10 vertebra, subsequent encounter for fracture with routine healing: Secondary | ICD-10-CM | POA: Diagnosis not present

## 2021-10-03 DIAGNOSIS — E559 Vitamin D deficiency, unspecified: Secondary | ICD-10-CM | POA: Diagnosis not present

## 2021-10-03 DIAGNOSIS — M549 Dorsalgia, unspecified: Secondary | ICD-10-CM | POA: Diagnosis not present

## 2021-10-04 DIAGNOSIS — F32A Depression, unspecified: Secondary | ICD-10-CM | POA: Diagnosis not present

## 2021-10-04 DIAGNOSIS — S52501D Unspecified fracture of the lower end of right radius, subsequent encounter for closed fracture with routine healing: Secondary | ICD-10-CM | POA: Diagnosis not present

## 2021-10-04 DIAGNOSIS — S22088D Other fracture of T11-T12 vertebra, subsequent encounter for fracture with routine healing: Secondary | ICD-10-CM | POA: Diagnosis not present

## 2021-10-04 DIAGNOSIS — N3281 Overactive bladder: Secondary | ICD-10-CM | POA: Diagnosis not present

## 2021-10-04 DIAGNOSIS — S22078D Other fracture of T9-T10 vertebra, subsequent encounter for fracture with routine healing: Secondary | ICD-10-CM | POA: Diagnosis not present

## 2021-10-04 DIAGNOSIS — E538 Deficiency of other specified B group vitamins: Secondary | ICD-10-CM | POA: Diagnosis not present

## 2021-10-04 DIAGNOSIS — G4733 Obstructive sleep apnea (adult) (pediatric): Secondary | ICD-10-CM | POA: Diagnosis not present

## 2021-10-04 DIAGNOSIS — S42211D Unspecified displaced fracture of surgical neck of right humerus, subsequent encounter for fracture with routine healing: Secondary | ICD-10-CM | POA: Diagnosis not present

## 2021-10-04 DIAGNOSIS — M1991 Primary osteoarthritis, unspecified site: Secondary | ICD-10-CM | POA: Diagnosis not present

## 2021-10-04 DIAGNOSIS — E039 Hypothyroidism, unspecified: Secondary | ICD-10-CM | POA: Diagnosis not present

## 2021-10-04 DIAGNOSIS — J9602 Acute respiratory failure with hypercapnia: Secondary | ICD-10-CM | POA: Diagnosis not present

## 2021-10-04 DIAGNOSIS — Z87891 Personal history of nicotine dependence: Secondary | ICD-10-CM | POA: Diagnosis not present

## 2021-10-04 DIAGNOSIS — E559 Vitamin D deficiency, unspecified: Secondary | ICD-10-CM | POA: Diagnosis not present

## 2021-10-04 DIAGNOSIS — D649 Anemia, unspecified: Secondary | ICD-10-CM | POA: Diagnosis not present

## 2021-10-04 DIAGNOSIS — E785 Hyperlipidemia, unspecified: Secondary | ICD-10-CM | POA: Diagnosis not present

## 2021-10-04 DIAGNOSIS — J9601 Acute respiratory failure with hypoxia: Secondary | ICD-10-CM | POA: Diagnosis not present

## 2021-10-04 DIAGNOSIS — Z87440 Personal history of urinary (tract) infections: Secondary | ICD-10-CM | POA: Diagnosis not present

## 2021-10-04 DIAGNOSIS — I10 Essential (primary) hypertension: Secondary | ICD-10-CM | POA: Diagnosis not present

## 2021-10-04 DIAGNOSIS — Z9181 History of falling: Secondary | ICD-10-CM | POA: Diagnosis not present

## 2021-10-04 DIAGNOSIS — M549 Dorsalgia, unspecified: Secondary | ICD-10-CM | POA: Diagnosis not present

## 2021-10-04 DIAGNOSIS — Z993 Dependence on wheelchair: Secondary | ICD-10-CM | POA: Diagnosis not present

## 2021-10-04 DIAGNOSIS — G8929 Other chronic pain: Secondary | ICD-10-CM | POA: Diagnosis not present

## 2021-10-06 DIAGNOSIS — M25531 Pain in right wrist: Secondary | ICD-10-CM | POA: Diagnosis not present

## 2021-10-07 DIAGNOSIS — S42211D Unspecified displaced fracture of surgical neck of right humerus, subsequent encounter for fracture with routine healing: Secondary | ICD-10-CM | POA: Diagnosis not present

## 2021-10-07 DIAGNOSIS — M1991 Primary osteoarthritis, unspecified site: Secondary | ICD-10-CM | POA: Diagnosis not present

## 2021-10-07 DIAGNOSIS — S52501D Unspecified fracture of the lower end of right radius, subsequent encounter for closed fracture with routine healing: Secondary | ICD-10-CM | POA: Diagnosis not present

## 2021-10-07 DIAGNOSIS — D649 Anemia, unspecified: Secondary | ICD-10-CM | POA: Diagnosis not present

## 2021-10-07 DIAGNOSIS — Z993 Dependence on wheelchair: Secondary | ICD-10-CM | POA: Diagnosis not present

## 2021-10-07 DIAGNOSIS — E559 Vitamin D deficiency, unspecified: Secondary | ICD-10-CM | POA: Diagnosis not present

## 2021-10-07 DIAGNOSIS — E039 Hypothyroidism, unspecified: Secondary | ICD-10-CM | POA: Diagnosis not present

## 2021-10-07 DIAGNOSIS — J9601 Acute respiratory failure with hypoxia: Secondary | ICD-10-CM | POA: Diagnosis not present

## 2021-10-07 DIAGNOSIS — M549 Dorsalgia, unspecified: Secondary | ICD-10-CM | POA: Diagnosis not present

## 2021-10-07 DIAGNOSIS — S22078D Other fracture of T9-T10 vertebra, subsequent encounter for fracture with routine healing: Secondary | ICD-10-CM | POA: Diagnosis not present

## 2021-10-07 DIAGNOSIS — I10 Essential (primary) hypertension: Secondary | ICD-10-CM | POA: Diagnosis not present

## 2021-10-07 DIAGNOSIS — E785 Hyperlipidemia, unspecified: Secondary | ICD-10-CM | POA: Diagnosis not present

## 2021-10-07 DIAGNOSIS — Z87891 Personal history of nicotine dependence: Secondary | ICD-10-CM | POA: Diagnosis not present

## 2021-10-07 DIAGNOSIS — G4733 Obstructive sleep apnea (adult) (pediatric): Secondary | ICD-10-CM | POA: Diagnosis not present

## 2021-10-07 DIAGNOSIS — Z9181 History of falling: Secondary | ICD-10-CM | POA: Diagnosis not present

## 2021-10-07 DIAGNOSIS — F32A Depression, unspecified: Secondary | ICD-10-CM | POA: Diagnosis not present

## 2021-10-07 DIAGNOSIS — G8929 Other chronic pain: Secondary | ICD-10-CM | POA: Diagnosis not present

## 2021-10-07 DIAGNOSIS — S22088D Other fracture of T11-T12 vertebra, subsequent encounter for fracture with routine healing: Secondary | ICD-10-CM | POA: Diagnosis not present

## 2021-10-07 DIAGNOSIS — J9602 Acute respiratory failure with hypercapnia: Secondary | ICD-10-CM | POA: Diagnosis not present

## 2021-10-07 DIAGNOSIS — E538 Deficiency of other specified B group vitamins: Secondary | ICD-10-CM | POA: Diagnosis not present

## 2021-10-07 DIAGNOSIS — N3281 Overactive bladder: Secondary | ICD-10-CM | POA: Diagnosis not present

## 2021-10-07 DIAGNOSIS — Z87440 Personal history of urinary (tract) infections: Secondary | ICD-10-CM | POA: Diagnosis not present

## 2021-10-08 ENCOUNTER — Other Ambulatory Visit: Payer: Self-pay | Admitting: Family Medicine

## 2021-10-08 DIAGNOSIS — M545 Low back pain, unspecified: Secondary | ICD-10-CM

## 2021-10-08 DIAGNOSIS — G8929 Other chronic pain: Secondary | ICD-10-CM

## 2021-10-10 DIAGNOSIS — G4733 Obstructive sleep apnea (adult) (pediatric): Secondary | ICD-10-CM | POA: Diagnosis not present

## 2021-10-10 DIAGNOSIS — N3281 Overactive bladder: Secondary | ICD-10-CM | POA: Diagnosis not present

## 2021-10-10 DIAGNOSIS — E559 Vitamin D deficiency, unspecified: Secondary | ICD-10-CM | POA: Diagnosis not present

## 2021-10-10 DIAGNOSIS — S42211D Unspecified displaced fracture of surgical neck of right humerus, subsequent encounter for fracture with routine healing: Secondary | ICD-10-CM | POA: Diagnosis not present

## 2021-10-10 DIAGNOSIS — S52501D Unspecified fracture of the lower end of right radius, subsequent encounter for closed fracture with routine healing: Secondary | ICD-10-CM | POA: Diagnosis not present

## 2021-10-10 DIAGNOSIS — F32A Depression, unspecified: Secondary | ICD-10-CM | POA: Diagnosis not present

## 2021-10-10 DIAGNOSIS — M1991 Primary osteoarthritis, unspecified site: Secondary | ICD-10-CM | POA: Diagnosis not present

## 2021-10-10 DIAGNOSIS — S22088D Other fracture of T11-T12 vertebra, subsequent encounter for fracture with routine healing: Secondary | ICD-10-CM | POA: Diagnosis not present

## 2021-10-10 DIAGNOSIS — G8929 Other chronic pain: Secondary | ICD-10-CM | POA: Diagnosis not present

## 2021-10-10 DIAGNOSIS — S22078D Other fracture of T9-T10 vertebra, subsequent encounter for fracture with routine healing: Secondary | ICD-10-CM | POA: Diagnosis not present

## 2021-10-10 DIAGNOSIS — I10 Essential (primary) hypertension: Secondary | ICD-10-CM | POA: Diagnosis not present

## 2021-10-10 DIAGNOSIS — E538 Deficiency of other specified B group vitamins: Secondary | ICD-10-CM | POA: Diagnosis not present

## 2021-10-10 DIAGNOSIS — E039 Hypothyroidism, unspecified: Secondary | ICD-10-CM | POA: Diagnosis not present

## 2021-10-10 DIAGNOSIS — Z87440 Personal history of urinary (tract) infections: Secondary | ICD-10-CM | POA: Diagnosis not present

## 2021-10-10 DIAGNOSIS — Z9181 History of falling: Secondary | ICD-10-CM | POA: Diagnosis not present

## 2021-10-10 DIAGNOSIS — Z993 Dependence on wheelchair: Secondary | ICD-10-CM | POA: Diagnosis not present

## 2021-10-10 DIAGNOSIS — D649 Anemia, unspecified: Secondary | ICD-10-CM | POA: Diagnosis not present

## 2021-10-10 DIAGNOSIS — J9601 Acute respiratory failure with hypoxia: Secondary | ICD-10-CM | POA: Diagnosis not present

## 2021-10-10 DIAGNOSIS — J9602 Acute respiratory failure with hypercapnia: Secondary | ICD-10-CM | POA: Diagnosis not present

## 2021-10-10 DIAGNOSIS — E785 Hyperlipidemia, unspecified: Secondary | ICD-10-CM | POA: Diagnosis not present

## 2021-10-10 DIAGNOSIS — M549 Dorsalgia, unspecified: Secondary | ICD-10-CM | POA: Diagnosis not present

## 2021-10-10 DIAGNOSIS — Z87891 Personal history of nicotine dependence: Secondary | ICD-10-CM | POA: Diagnosis not present

## 2021-10-11 DIAGNOSIS — I10 Essential (primary) hypertension: Secondary | ICD-10-CM | POA: Diagnosis not present

## 2021-10-11 DIAGNOSIS — N3281 Overactive bladder: Secondary | ICD-10-CM | POA: Diagnosis not present

## 2021-10-11 DIAGNOSIS — M1991 Primary osteoarthritis, unspecified site: Secondary | ICD-10-CM | POA: Diagnosis not present

## 2021-10-11 DIAGNOSIS — E559 Vitamin D deficiency, unspecified: Secondary | ICD-10-CM | POA: Diagnosis not present

## 2021-10-11 DIAGNOSIS — Z9181 History of falling: Secondary | ICD-10-CM | POA: Diagnosis not present

## 2021-10-11 DIAGNOSIS — G4733 Obstructive sleep apnea (adult) (pediatric): Secondary | ICD-10-CM | POA: Diagnosis not present

## 2021-10-11 DIAGNOSIS — S52501D Unspecified fracture of the lower end of right radius, subsequent encounter for closed fracture with routine healing: Secondary | ICD-10-CM | POA: Diagnosis not present

## 2021-10-11 DIAGNOSIS — F32A Depression, unspecified: Secondary | ICD-10-CM | POA: Diagnosis not present

## 2021-10-11 DIAGNOSIS — J9601 Acute respiratory failure with hypoxia: Secondary | ICD-10-CM | POA: Diagnosis not present

## 2021-10-11 DIAGNOSIS — E039 Hypothyroidism, unspecified: Secondary | ICD-10-CM | POA: Diagnosis not present

## 2021-10-11 DIAGNOSIS — S42211D Unspecified displaced fracture of surgical neck of right humerus, subsequent encounter for fracture with routine healing: Secondary | ICD-10-CM | POA: Diagnosis not present

## 2021-10-11 DIAGNOSIS — Z87440 Personal history of urinary (tract) infections: Secondary | ICD-10-CM | POA: Diagnosis not present

## 2021-10-11 DIAGNOSIS — E538 Deficiency of other specified B group vitamins: Secondary | ICD-10-CM | POA: Diagnosis not present

## 2021-10-11 DIAGNOSIS — D649 Anemia, unspecified: Secondary | ICD-10-CM | POA: Diagnosis not present

## 2021-10-11 DIAGNOSIS — Z87891 Personal history of nicotine dependence: Secondary | ICD-10-CM | POA: Diagnosis not present

## 2021-10-11 DIAGNOSIS — S22088D Other fracture of T11-T12 vertebra, subsequent encounter for fracture with routine healing: Secondary | ICD-10-CM | POA: Diagnosis not present

## 2021-10-11 DIAGNOSIS — J9602 Acute respiratory failure with hypercapnia: Secondary | ICD-10-CM | POA: Diagnosis not present

## 2021-10-11 DIAGNOSIS — S22078D Other fracture of T9-T10 vertebra, subsequent encounter for fracture with routine healing: Secondary | ICD-10-CM | POA: Diagnosis not present

## 2021-10-11 DIAGNOSIS — G8929 Other chronic pain: Secondary | ICD-10-CM | POA: Diagnosis not present

## 2021-10-11 DIAGNOSIS — E785 Hyperlipidemia, unspecified: Secondary | ICD-10-CM | POA: Diagnosis not present

## 2021-10-11 DIAGNOSIS — Z993 Dependence on wheelchair: Secondary | ICD-10-CM | POA: Diagnosis not present

## 2021-10-11 DIAGNOSIS — M549 Dorsalgia, unspecified: Secondary | ICD-10-CM | POA: Diagnosis not present

## 2021-10-14 ENCOUNTER — Ambulatory Visit (INDEPENDENT_AMBULATORY_CARE_PROVIDER_SITE_OTHER): Payer: Medicare Other

## 2021-10-14 ENCOUNTER — Other Ambulatory Visit: Payer: Self-pay | Admitting: Family Medicine

## 2021-10-14 ENCOUNTER — Encounter: Payer: Self-pay | Admitting: Family

## 2021-10-14 ENCOUNTER — Other Ambulatory Visit: Payer: Self-pay

## 2021-10-14 ENCOUNTER — Ambulatory Visit (INDEPENDENT_AMBULATORY_CARE_PROVIDER_SITE_OTHER): Payer: Medicare Other | Admitting: Family

## 2021-10-14 VITALS — BP 155/71 | HR 62 | Temp 97.9°F | Ht 63.0 in | Wt 181.2 lb

## 2021-10-14 DIAGNOSIS — R0902 Hypoxemia: Secondary | ICD-10-CM | POA: Diagnosis not present

## 2021-10-14 DIAGNOSIS — D649 Anemia, unspecified: Secondary | ICD-10-CM | POA: Diagnosis not present

## 2021-10-14 DIAGNOSIS — S52501D Unspecified fracture of the lower end of right radius, subsequent encounter for closed fracture with routine healing: Secondary | ICD-10-CM | POA: Diagnosis not present

## 2021-10-14 DIAGNOSIS — F32A Depression, unspecified: Secondary | ICD-10-CM | POA: Diagnosis not present

## 2021-10-14 DIAGNOSIS — Z87440 Personal history of urinary (tract) infections: Secondary | ICD-10-CM | POA: Diagnosis not present

## 2021-10-14 DIAGNOSIS — J9601 Acute respiratory failure with hypoxia: Secondary | ICD-10-CM | POA: Diagnosis not present

## 2021-10-14 DIAGNOSIS — S22078D Other fracture of T9-T10 vertebra, subsequent encounter for fracture with routine healing: Secondary | ICD-10-CM | POA: Diagnosis not present

## 2021-10-14 DIAGNOSIS — E785 Hyperlipidemia, unspecified: Secondary | ICD-10-CM | POA: Diagnosis not present

## 2021-10-14 DIAGNOSIS — E039 Hypothyroidism, unspecified: Secondary | ICD-10-CM | POA: Diagnosis not present

## 2021-10-14 DIAGNOSIS — M1991 Primary osteoarthritis, unspecified site: Secondary | ICD-10-CM | POA: Diagnosis not present

## 2021-10-14 DIAGNOSIS — I10 Essential (primary) hypertension: Secondary | ICD-10-CM | POA: Diagnosis not present

## 2021-10-14 DIAGNOSIS — J159 Unspecified bacterial pneumonia: Secondary | ICD-10-CM

## 2021-10-14 DIAGNOSIS — G4733 Obstructive sleep apnea (adult) (pediatric): Secondary | ICD-10-CM | POA: Diagnosis not present

## 2021-10-14 DIAGNOSIS — Z9181 History of falling: Secondary | ICD-10-CM | POA: Diagnosis not present

## 2021-10-14 DIAGNOSIS — S22088D Other fracture of T11-T12 vertebra, subsequent encounter for fracture with routine healing: Secondary | ICD-10-CM | POA: Diagnosis not present

## 2021-10-14 DIAGNOSIS — G8929 Other chronic pain: Secondary | ICD-10-CM | POA: Diagnosis not present

## 2021-10-14 DIAGNOSIS — I517 Cardiomegaly: Secondary | ICD-10-CM | POA: Diagnosis not present

## 2021-10-14 DIAGNOSIS — J9602 Acute respiratory failure with hypercapnia: Secondary | ICD-10-CM | POA: Diagnosis not present

## 2021-10-14 DIAGNOSIS — M549 Dorsalgia, unspecified: Secondary | ICD-10-CM | POA: Diagnosis not present

## 2021-10-14 DIAGNOSIS — N3281 Overactive bladder: Secondary | ICD-10-CM | POA: Diagnosis not present

## 2021-10-14 DIAGNOSIS — E559 Vitamin D deficiency, unspecified: Secondary | ICD-10-CM | POA: Diagnosis not present

## 2021-10-14 DIAGNOSIS — R7981 Abnormal blood-gas level: Secondary | ICD-10-CM | POA: Diagnosis not present

## 2021-10-14 DIAGNOSIS — Z87891 Personal history of nicotine dependence: Secondary | ICD-10-CM | POA: Diagnosis not present

## 2021-10-14 DIAGNOSIS — Z993 Dependence on wheelchair: Secondary | ICD-10-CM | POA: Diagnosis not present

## 2021-10-14 DIAGNOSIS — E538 Deficiency of other specified B group vitamins: Secondary | ICD-10-CM | POA: Diagnosis not present

## 2021-10-14 DIAGNOSIS — S42211D Unspecified displaced fracture of surgical neck of right humerus, subsequent encounter for fracture with routine healing: Secondary | ICD-10-CM | POA: Diagnosis not present

## 2021-10-14 MED ORDER — PREDNISONE 10 MG (21) PO TBPK: ORAL_TABLET | ORAL | 0 refills | Status: DC

## 2021-10-14 MED ORDER — FLUCONAZOLE 150 MG PO TABS
150.0000 mg | ORAL_TABLET | ORAL | 0 refills | Status: DC | PRN
Start: 2021-10-14 — End: 2022-05-23

## 2021-10-14 MED ORDER — DOXYCYCLINE HYCLATE 100 MG PO TABS: 100.0000 mg | ORAL_TABLET | Freq: Two times a day (BID) | ORAL | 0 refills | Status: DC

## 2021-10-14 NOTE — Progress Notes (Signed)
Subjective:    Patient ID: Danielle Parrish, female    DOB: 03-30-1941, 80 y.o.   MRN: [id number]  Chief Complaint  Patient presents with   low oxygen    Pt presents to the office today with low oxygen saturation. She reports she had PT come to her home today and her oxygen was in the low 80's.  Pt's oxygen 77% after walking into room then increases to 84-89% while sitting on room air. 2L placed and her oxygen increases to 97 %. No SOB while sitting.  Shortness of Breath This is a new problem. The current episode started in the past 7 days. The problem occurs intermittently. Pertinent negatives include no chest pain, ear pain, fever, sore throat or sputum production.     Review of Systems  Constitutional:  Negative for fever.  HENT:  Negative for ear pain and sore throat.   Respiratory:  Positive for shortness of breath. Negative for sputum production.   Cardiovascular:  Negative for chest pain.  All other systems reviewed and are negative.     Objective:   Physical Exam Vitals reviewed.  Constitutional:      General: She is not in acute distress.    Appearance: She is well-developed. She is obese.  HENT:     Head: Normocephalic and atraumatic.  Eyes:     Pupils: Pupils are equal, round, and reactive to light.  Neck:     Thyroid: No thyromegaly.  Cardiovascular:     Rate and Rhythm: Normal rate and regular rhythm.     Heart sounds: Normal heart sounds. No murmur heard. Pulmonary:     Effort: Pulmonary effort is normal. No respiratory distress.     Breath sounds: No wheezing.     Comments: Diminished BS  Abdominal:     General: Bowel sounds are normal. There is no distension.     Palpations: Abdomen is soft.     Tenderness: There is no abdominal tenderness.  Musculoskeletal:        General: No tenderness. Normal range of motion.     Cervical back: Normal range of motion and neck supple.  Skin:    General: Skin is warm and dry.  Neurological:     Mental Status:  She is alert and oriented to person, place, and time.     Cranial Nerves: No cranial nerve deficit.     Deep Tendon Reflexes: Reflexes are normal and symmetric.  Psychiatric:        Behavior: Behavior normal.        Thought Content: Thought content normal.        Judgment: Judgment normal.   X-ray- Possible atelectasis, scarring or early inflammation,Preliminary reading by Evelina Dun, FNP [facility]  Oxygen 97% with 2L of O2  BP (!) 155/71   Pulse 62   Temp 97.9 F (36.6 C)   Ht 5\' 3"  (1.6 m)   Wt 181 lb 3.2 oz (82.2 kg)   SpO2 (!) 87% Comment: after sitting on room air  BMI 32.10 kg/m      Assessment & Plan:  Ruthell Feigenbaum Daleo comes in today with chief complaint of low oxygen    Diagnosis and orders addressed:  1. Low oxygen saturation - DG Chest 2 View - For home use only DME oxygen - fluconazole (DIFLUCAN) 150 MG tablet; Take 1 tablet (150 mg total) by mouth every three (3) days as needed.  Dispense: 3 tablet; Refill: 0  2. Bacterial pneumonia Pt will start doxycyline  today  Prednisone dose pack  - doxycycline (VIBRA-TABS) 100 MG tablet; Take 1 tablet (100 mg total) by mouth 2 (two) times daily.  Dispense: 20 tablet; Refill: 0 - predniSONE (STERAPRED UNI-PAK 21 TAB) 10 MG (21) TBPK tablet; Use as directed  Dispense: 21 tablet; Refill: 0 - fluconazole (DIFLUCAN) 150 MG tablet; Take 1 tablet (150 mg total) by mouth every three (3) days as needed.  Dispense: 3 tablet; Refill: 0   She denies any SOB, cough, or fevers. Her oxygen is 87% on room air, however, increases to 97% on 2 L. I have ordered home oxygen to be delivered today to her home. Any increased SOB, fevers go to ED.  Approx  45 mins spent with patient, educating,  and documenting.    Evelina Dun, FNP

## 2021-10-15 DIAGNOSIS — Z87891 Personal history of nicotine dependence: Secondary | ICD-10-CM | POA: Diagnosis not present

## 2021-10-15 DIAGNOSIS — Z87440 Personal history of urinary (tract) infections: Secondary | ICD-10-CM | POA: Diagnosis not present

## 2021-10-15 DIAGNOSIS — E538 Deficiency of other specified B group vitamins: Secondary | ICD-10-CM | POA: Diagnosis not present

## 2021-10-15 DIAGNOSIS — M549 Dorsalgia, unspecified: Secondary | ICD-10-CM | POA: Diagnosis not present

## 2021-10-15 DIAGNOSIS — E785 Hyperlipidemia, unspecified: Secondary | ICD-10-CM | POA: Diagnosis not present

## 2021-10-15 DIAGNOSIS — J9602 Acute respiratory failure with hypercapnia: Secondary | ICD-10-CM | POA: Diagnosis not present

## 2021-10-15 DIAGNOSIS — D649 Anemia, unspecified: Secondary | ICD-10-CM | POA: Diagnosis not present

## 2021-10-15 DIAGNOSIS — Z9181 History of falling: Secondary | ICD-10-CM | POA: Diagnosis not present

## 2021-10-15 DIAGNOSIS — J9601 Acute respiratory failure with hypoxia: Secondary | ICD-10-CM | POA: Diagnosis not present

## 2021-10-15 DIAGNOSIS — G8929 Other chronic pain: Secondary | ICD-10-CM | POA: Diagnosis not present

## 2021-10-15 DIAGNOSIS — E039 Hypothyroidism, unspecified: Secondary | ICD-10-CM | POA: Diagnosis not present

## 2021-10-15 DIAGNOSIS — S22088D Other fracture of T11-T12 vertebra, subsequent encounter for fracture with routine healing: Secondary | ICD-10-CM | POA: Diagnosis not present

## 2021-10-15 DIAGNOSIS — S52501D Unspecified fracture of the lower end of right radius, subsequent encounter for closed fracture with routine healing: Secondary | ICD-10-CM | POA: Diagnosis not present

## 2021-10-15 DIAGNOSIS — G4733 Obstructive sleep apnea (adult) (pediatric): Secondary | ICD-10-CM | POA: Diagnosis not present

## 2021-10-15 DIAGNOSIS — S42211D Unspecified displaced fracture of surgical neck of right humerus, subsequent encounter for fracture with routine healing: Secondary | ICD-10-CM | POA: Diagnosis not present

## 2021-10-15 DIAGNOSIS — I10 Essential (primary) hypertension: Secondary | ICD-10-CM | POA: Diagnosis not present

## 2021-10-15 DIAGNOSIS — Z993 Dependence on wheelchair: Secondary | ICD-10-CM | POA: Diagnosis not present

## 2021-10-15 DIAGNOSIS — F32A Depression, unspecified: Secondary | ICD-10-CM | POA: Diagnosis not present

## 2021-10-15 DIAGNOSIS — E559 Vitamin D deficiency, unspecified: Secondary | ICD-10-CM | POA: Diagnosis not present

## 2021-10-15 DIAGNOSIS — S22078D Other fracture of T9-T10 vertebra, subsequent encounter for fracture with routine healing: Secondary | ICD-10-CM | POA: Diagnosis not present

## 2021-10-15 DIAGNOSIS — N3281 Overactive bladder: Secondary | ICD-10-CM | POA: Diagnosis not present

## 2021-10-15 DIAGNOSIS — M1991 Primary osteoarthritis, unspecified site: Secondary | ICD-10-CM | POA: Diagnosis not present

## 2021-10-17 ENCOUNTER — Ambulatory Visit (INDEPENDENT_AMBULATORY_CARE_PROVIDER_SITE_OTHER): Payer: Medicare Other | Admitting: Family Medicine

## 2021-10-17 ENCOUNTER — Encounter: Payer: Self-pay | Admitting: Family Medicine

## 2021-10-17 ENCOUNTER — Other Ambulatory Visit: Payer: Self-pay

## 2021-10-17 ENCOUNTER — Telehealth: Payer: Self-pay | Admitting: Family Medicine

## 2021-10-17 VITALS — BP 165/86 | HR 82 | Temp 97.2°F | Ht 63.0 in | Wt 176.4 lb

## 2021-10-17 DIAGNOSIS — R0902 Hypoxemia: Secondary | ICD-10-CM

## 2021-10-17 DIAGNOSIS — R399 Unspecified symptoms and signs involving the genitourinary system: Secondary | ICD-10-CM | POA: Diagnosis not present

## 2021-10-17 DIAGNOSIS — N3941 Urge incontinence: Secondary | ICD-10-CM

## 2021-10-17 LAB — MICROSCOPIC EXAMINATION: Renal Epithel, UA: NONE SEEN /hpf

## 2021-10-17 LAB — URINALYSIS, ROUTINE W REFLEX MICROSCOPIC
Bilirubin, UA: NEGATIVE
Glucose, UA: NEGATIVE
Nitrite, UA: NEGATIVE
Protein,UA: NEGATIVE
RBC, UA: NEGATIVE
Specific Gravity, UA: 1.02 (ref 1.005–1.030)
Urobilinogen, Ur: 0.2 mg/dL (ref 0.2–1.0)
pH, UA: 6 (ref 5.0–7.5)

## 2021-10-17 NOTE — Patient Instructions (Addendum)
Consider seeing a urologist (bladder specialist) Your urine doesn't show infection but I will culture it to be sure. I think your symptoms are likely chronic issues.

## 2021-10-17 NOTE — Telephone Encounter (Signed)
VO given to add nursing eval, PT is already working with pt

## 2021-10-17 NOTE — Progress Notes (Signed)
Subjective: CC: recheck O2 PCP: Dettinger, Fransisca Kaufmann, MD [id number] Danielle Parrish is a 80 y.o. female presenting to clinic today for:  1. Hypoxia Noted to be hypoxic on Friday.  Was prescribed O2 and patient notes that the supplier will be by today to hook it up to her CPAP.  She stays on O2 all day long and does ok.   2. Back pain, lumbar Reports chronic and recurrent issue with this.  Onset about 1.5 weeks ago but did not discuss on Friday.  Has incontinence, urge, that seems worse over last 1.5 weeks as well. No dysuria, hematuria, nausea, vomiting, fevers.  Has chronic dry lips. Only drinks about 4 bottles of water per day   ROS: Per HPI  Allergies  Allergen Reactions   Crestor [Rosuvastatin] Other (See Comments)    Body aches.    Nsaids     Burns stomach.    Vytorin [Ezetimibe-Simvastatin]     Body aches.    Aspirin Nausea Only   Tolmetin Nausea Only    Burns stomach.    Past Medical History:  Diagnosis Date   Anxiety    Arthritis    CHF (congestive heart failure) (HCC)    Chronic pain of multiple joints    Depression    Essential hypertension    GERD (gastroesophageal reflux disease)    Hyperlipidemia    Hyperlipidemia associated with type 2 diabetes mellitus ([place])    Hypertension associated with type 2 diabetes mellitus ([place])    Hypothyroidism    Osteoporosis    Sleep apnea    CPAP   Type 2 diabetes mellitus with hypercholesterolemia (HCC)    Vitamin D deficiency     Current Outpatient Medications:    acetaminophen (TYLENOL) 500 MG tablet, Take 1 tablet (500 mg total) by mouth every 6 (six) hours as needed., Disp: 30 tablet, Rfl: 0   albuterol (VENTOLIN HFA) 108 (90 Base) MCG/ACT inhaler, TAKE 2 PUFFS BY MOUTH EVERY 6 HOURS AS NEEDED FOR WHEEZE OR SHORTNESS OF BREATH, Disp: 6.7 each, Rfl: 3   amLODipine (NORVASC) 10 MG tablet, Take 1 tablet (10 mg total) by mouth daily., Disp: 90 tablet, Rfl: 3   calcium-vitamin D (OSCAL WITH D) 500-200 MG-UNIT TABS tablet,  TAKE 1 TABLET BY MOUTH EVERY DAY WITH BREAKFAST, Disp: 90 tablet, Rfl: 1   Cholecalciferol (VITAMIN D-3 PO), Take 1 capsule by mouth daily. , Disp: , Rfl:    cyclobenzaprine (FLEXERIL) 5 MG tablet, Take 1 tablet (5 mg total) by mouth 3 (three) times daily as needed for muscle spasms., Disp: 30 tablet, Rfl: 1   denosumab (PROLIA) 60 MG/ML SOSY injection, Bring to your doctor to inject 60 mg subcutaneously., Disp: 1 mL, Rfl: 3   diclofenac Sodium (VOLTAREN) 1 % GEL, APPLY 4 GRAMS TOPICALLY 4 (FOUR) TIMES DAILY., Disp: 400 g, Rfl: 1   diphenhydrAMINE (BENADRYL) 25 MG tablet, Take 50 mg by mouth at bedtime as needed for allergies. , Disp: , Rfl:    doxycycline (VIBRA-TABS) 100 MG tablet, Take 1 tablet (100 mg total) by mouth 2 (two) times daily., Disp: 20 tablet, Rfl: 0   fluconazole (DIFLUCAN) 150 MG tablet, Take 1 tablet (150 mg total) by mouth every three (3) days as needed., Disp: 3 tablet, Rfl: 0   gabapentin (NEURONTIN) 300 MG capsule, Take 300 mg by mouth 3 (three) times daily., Disp: , Rfl:    HYDROcodone-acetaminophen (NORCO/VICODIN) 5-325 MG tablet, Take 1 tablet by mouth daily as needed for moderate pain., Disp:  30 tablet, Rfl: 0   ketoconazole (NIZORAL) 2 % cream, APPLY TO AFFECTED AREA EVERYDAY, Disp: 60 g, Rfl: 2   levothyroxine (SYNTHROID) 100 MCG tablet, Take 1 tablet (100 mcg total) by mouth daily before breakfast., Disp: 90 tablet, Rfl: 3   lidocaine (LIDODERM) 5 %, PLACE 1 PATCH ONTO THE SKIN DAILY. REMOVE & DISCARD PATCH WITHIN 12 HOURS OR AS DIRECTED BY MD, Disp: 30 patch, Rfl: 0   lisinopril (ZESTRIL) 20 MG tablet, Take 1 tablet (20 mg total) by mouth daily., Disp: 90 tablet, Rfl: 3   lovastatin (MEVACOR) 40 MG tablet, TAKE 1 TABLET BY MOUTH EVERYDAY AT BEDTIME, Disp: 90 tablet, Rfl: 3   metoprolol succinate (TOPROL-XL) 25 MG 24 hr tablet, Take 1 tablet (25 mg total) by mouth daily., Disp: 90 tablet, Rfl: 3   mirabegron ER (MYRBETRIQ) 25 MG TB24 tablet, Take 1 tablet (25 mg total)  by mouth daily., Disp: 30 tablet, Rfl: 3   Multiple Vitamin (MULTI-VITAMIN) tablet, Take by mouth., Disp: , Rfl:    Multiple Vitamins-Minerals (PRESERVISION AREDS 2 PO), Take by mouth., Disp: , Rfl:    nitrofurantoin, macrocrystal-monohydrate, (MACROBID) 100 MG capsule, Take 100 mg by mouth daily., Disp: , Rfl:    nystatin (MYCOSTATIN/NYSTOP) powder, Apply topically 4 (four) times daily., Disp: 15 g, Rfl: 0   nystatin cream (MYCOSTATIN), Apply 1 application topically 2 (two) times daily., Disp: 30 g, Rfl: PRN   omeprazole (PRILOSEC) 20 MG capsule, Take 1 capsule (20 mg total) by mouth 2 (two) times daily before a meal., Disp: 180 capsule, Rfl: 1   predniSONE (STERAPRED UNI-PAK 21 TAB) 10 MG (21) TBPK tablet, Use as directed, Disp: 21 tablet, Rfl: 0   PROLIA 60 MG/ML SOSY injection, TO BE ADMINISTERED IN PHYSICIAN'S OFFICE. INJECT ONE SYRINGE SUBCOUSLY ONCE EVERY 6 MONTHS. REFRIGERATE. USE WITHIN 14 DAYS ONCE AT ROOM TEMPERATURE., Disp: 1 mL, Rfl: 1   pyridOXINE (VITAMIN B-6) 100 MG tablet, Take 100 mg by mouth daily., Disp: , Rfl:    sertraline (ZOLOFT) 100 MG tablet, Take 2 tablets (200 mg total) by mouth daily., Disp: 180 tablet, Rfl: 3   sodium chloride (OCEAN) 0.65 % SOLN nasal spray, Place 1 spray into both nostrils as needed for congestion., Disp: 1 Bottle, Rfl: prn Social History   Socioeconomic History   Marital status: Widowed    Spouse name: Laverna Peace   Number of children: 2   Years of education: Not on file   Highest education level: High school graduate  Occupational History   Occupation: CNA    Comment: [employer]   Occupation: retired  Tobacco Use   Smoking status: Never   Smokeless tobacco: Never  Scientific laboratory technician Use: Never used  Substance and Sexual Activity   Alcohol use: No   Drug use: No   Sexual activity: Not Currently  Other Topics Concern   Not on file  Social History Narrative   Lives with her daughter   Social Determinants of Health   Financial  Resource Strain: Low Risk    Difficulty of Paying Living Expenses: Not very hard  Food Insecurity: No Food Insecurity   Worried About Charity fundraiser in the Last Year: Never true   [place] in the Last Year: Never true  Transportation Needs: No Transportation Needs   Lack of Transportation (Medical): No   Lack of Transportation (Non-Medical): No  Physical Activity: Insufficiently Active   Days of Exercise per Week: 3 days  Minutes of Exercise per Session: 20 min  Stress: No Stress Concern Present   Feeling of Stress : Not at all  Social Connections: Socially Isolated   Frequency of Communication with Friends and Family: More than three times a week   Frequency of Social Gatherings with Friends and Family: More than three times a week   Attends Religious Services: Never   Marine scientist or Organizations: No   Attends Archivist Meetings: Never   Marital Status: Widowed  Human resources officer Violence: Not At Risk   Fear of Current or Ex-Partner: No   Emotionally Abused: No   Physically Abused: No   Sexually Abused: No   Family History  Problem Relation Age of Onset   Hypertension Mother    Stroke Mother    Cancer Father     Objective: Office vital signs reviewed. BP (!) 163/87   Pulse 82   Temp (!) 97.2 F (36.2 C)   Ht 5\' 3"  (1.6 m)   Wt 176 lb 6.4 oz (80 kg)   SpO2 92%   BMI 31.25 kg/m   Physical Examination:  General: Awake, alert, nontoxic, No acute distress HEENT: Normal, sclera white. Cardio: regular rate and rhythm, S1S2 heard, no murmurs appreciated Pulm: mildly coarse at bases but no wheezes, rhonchi or rales; normal work of breathing on room air GU: no CVA TTP, no suprapubic TTP MSK: ambulates with cane.  Assessment/ Plan: 80 y.o. female   Urinary symptom or sign - Plan: Urinalysis, Routine w reflex microscopic, Urine Culture  Urge incontinence of urine - Plan: Urinalysis, Routine w reflex microscopic, Urine  Culture  Hypoxia  Urge incontinence is chronic and exacerbated despite use of myrbetriq.  Encouraged chronic f/u with PCP.  May consider referral to urology? No evidence of UTI on UA but will send for culture.  Not hypoxic on RA today.  Continue O2 as directed.  Orders Placed This Encounter  Procedures   Urinalysis, Routine w reflex microscopic   No orders of the defined types were placed in this encounter.    Janora Norlander, DO [place] [phone]

## 2021-10-18 DIAGNOSIS — E039 Hypothyroidism, unspecified: Secondary | ICD-10-CM | POA: Diagnosis not present

## 2021-10-18 DIAGNOSIS — E538 Deficiency of other specified B group vitamins: Secondary | ICD-10-CM | POA: Diagnosis not present

## 2021-10-18 DIAGNOSIS — M1991 Primary osteoarthritis, unspecified site: Secondary | ICD-10-CM | POA: Diagnosis not present

## 2021-10-18 DIAGNOSIS — G4733 Obstructive sleep apnea (adult) (pediatric): Secondary | ICD-10-CM | POA: Diagnosis not present

## 2021-10-18 DIAGNOSIS — M549 Dorsalgia, unspecified: Secondary | ICD-10-CM | POA: Diagnosis not present

## 2021-10-18 DIAGNOSIS — Z9181 History of falling: Secondary | ICD-10-CM | POA: Diagnosis not present

## 2021-10-18 DIAGNOSIS — N3281 Overactive bladder: Secondary | ICD-10-CM | POA: Diagnosis not present

## 2021-10-18 DIAGNOSIS — S22088D Other fracture of T11-T12 vertebra, subsequent encounter for fracture with routine healing: Secondary | ICD-10-CM | POA: Diagnosis not present

## 2021-10-18 DIAGNOSIS — D649 Anemia, unspecified: Secondary | ICD-10-CM | POA: Diagnosis not present

## 2021-10-18 DIAGNOSIS — E785 Hyperlipidemia, unspecified: Secondary | ICD-10-CM | POA: Diagnosis not present

## 2021-10-18 DIAGNOSIS — F32A Depression, unspecified: Secondary | ICD-10-CM | POA: Diagnosis not present

## 2021-10-18 DIAGNOSIS — S22078D Other fracture of T9-T10 vertebra, subsequent encounter for fracture with routine healing: Secondary | ICD-10-CM | POA: Diagnosis not present

## 2021-10-18 DIAGNOSIS — S42211D Unspecified displaced fracture of surgical neck of right humerus, subsequent encounter for fracture with routine healing: Secondary | ICD-10-CM | POA: Diagnosis not present

## 2021-10-18 DIAGNOSIS — Z87891 Personal history of nicotine dependence: Secondary | ICD-10-CM | POA: Diagnosis not present

## 2021-10-18 DIAGNOSIS — S52501D Unspecified fracture of the lower end of right radius, subsequent encounter for closed fracture with routine healing: Secondary | ICD-10-CM | POA: Diagnosis not present

## 2021-10-18 DIAGNOSIS — J9602 Acute respiratory failure with hypercapnia: Secondary | ICD-10-CM | POA: Diagnosis not present

## 2021-10-18 DIAGNOSIS — E559 Vitamin D deficiency, unspecified: Secondary | ICD-10-CM | POA: Diagnosis not present

## 2021-10-18 DIAGNOSIS — Z993 Dependence on wheelchair: Secondary | ICD-10-CM | POA: Diagnosis not present

## 2021-10-18 DIAGNOSIS — G8929 Other chronic pain: Secondary | ICD-10-CM | POA: Diagnosis not present

## 2021-10-18 DIAGNOSIS — I10 Essential (primary) hypertension: Secondary | ICD-10-CM | POA: Diagnosis not present

## 2021-10-18 DIAGNOSIS — J9601 Acute respiratory failure with hypoxia: Secondary | ICD-10-CM | POA: Diagnosis not present

## 2021-10-18 DIAGNOSIS — Z87440 Personal history of urinary (tract) infections: Secondary | ICD-10-CM | POA: Diagnosis not present

## 2021-10-19 ENCOUNTER — Telehealth: Payer: Self-pay | Admitting: Neurology

## 2021-10-19 DIAGNOSIS — E559 Vitamin D deficiency, unspecified: Secondary | ICD-10-CM | POA: Diagnosis not present

## 2021-10-19 DIAGNOSIS — Z87891 Personal history of nicotine dependence: Secondary | ICD-10-CM | POA: Diagnosis not present

## 2021-10-19 DIAGNOSIS — Z87440 Personal history of urinary (tract) infections: Secondary | ICD-10-CM | POA: Diagnosis not present

## 2021-10-19 DIAGNOSIS — F32A Depression, unspecified: Secondary | ICD-10-CM | POA: Diagnosis not present

## 2021-10-19 DIAGNOSIS — D649 Anemia, unspecified: Secondary | ICD-10-CM | POA: Diagnosis not present

## 2021-10-19 DIAGNOSIS — S52501D Unspecified fracture of the lower end of right radius, subsequent encounter for closed fracture with routine healing: Secondary | ICD-10-CM | POA: Diagnosis not present

## 2021-10-19 DIAGNOSIS — E039 Hypothyroidism, unspecified: Secondary | ICD-10-CM | POA: Diagnosis not present

## 2021-10-19 DIAGNOSIS — S42211D Unspecified displaced fracture of surgical neck of right humerus, subsequent encounter for fracture with routine healing: Secondary | ICD-10-CM | POA: Diagnosis not present

## 2021-10-19 DIAGNOSIS — Z9181 History of falling: Secondary | ICD-10-CM | POA: Diagnosis not present

## 2021-10-19 DIAGNOSIS — G4733 Obstructive sleep apnea (adult) (pediatric): Secondary | ICD-10-CM | POA: Diagnosis not present

## 2021-10-19 DIAGNOSIS — N3281 Overactive bladder: Secondary | ICD-10-CM | POA: Diagnosis not present

## 2021-10-19 DIAGNOSIS — G8929 Other chronic pain: Secondary | ICD-10-CM | POA: Diagnosis not present

## 2021-10-19 DIAGNOSIS — J9602 Acute respiratory failure with hypercapnia: Secondary | ICD-10-CM | POA: Diagnosis not present

## 2021-10-19 DIAGNOSIS — E785 Hyperlipidemia, unspecified: Secondary | ICD-10-CM | POA: Diagnosis not present

## 2021-10-19 DIAGNOSIS — M549 Dorsalgia, unspecified: Secondary | ICD-10-CM | POA: Diagnosis not present

## 2021-10-19 DIAGNOSIS — E538 Deficiency of other specified B group vitamins: Secondary | ICD-10-CM | POA: Diagnosis not present

## 2021-10-19 DIAGNOSIS — S22088D Other fracture of T11-T12 vertebra, subsequent encounter for fracture with routine healing: Secondary | ICD-10-CM | POA: Diagnosis not present

## 2021-10-19 DIAGNOSIS — J9601 Acute respiratory failure with hypoxia: Secondary | ICD-10-CM | POA: Diagnosis not present

## 2021-10-19 DIAGNOSIS — S22078D Other fracture of T9-T10 vertebra, subsequent encounter for fracture with routine healing: Secondary | ICD-10-CM | POA: Diagnosis not present

## 2021-10-19 DIAGNOSIS — Z993 Dependence on wheelchair: Secondary | ICD-10-CM | POA: Diagnosis not present

## 2021-10-19 DIAGNOSIS — I10 Essential (primary) hypertension: Secondary | ICD-10-CM | POA: Diagnosis not present

## 2021-10-19 DIAGNOSIS — M419 Scoliosis, unspecified: Secondary | ICD-10-CM

## 2021-10-19 DIAGNOSIS — M1991 Primary osteoarthritis, unspecified site: Secondary | ICD-10-CM | POA: Diagnosis not present

## 2021-10-19 HISTORY — DX: Scoliosis, unspecified: M41.9

## 2021-10-19 LAB — URINE CULTURE

## 2021-10-19 NOTE — Telephone Encounter (Signed)
Pt called has been put on oxygen, however she states her CPAP air pressure is not high enough. Pt wanting to come in sooner, requesting a call back.

## 2021-10-20 ENCOUNTER — Telehealth: Payer: Self-pay | Admitting: *Deleted

## 2021-10-20 DIAGNOSIS — J9601 Acute respiratory failure with hypoxia: Secondary | ICD-10-CM | POA: Diagnosis not present

## 2021-10-20 DIAGNOSIS — D649 Anemia, unspecified: Secondary | ICD-10-CM | POA: Diagnosis not present

## 2021-10-20 DIAGNOSIS — Z87891 Personal history of nicotine dependence: Secondary | ICD-10-CM | POA: Diagnosis not present

## 2021-10-20 DIAGNOSIS — S22078D Other fracture of T9-T10 vertebra, subsequent encounter for fracture with routine healing: Secondary | ICD-10-CM | POA: Diagnosis not present

## 2021-10-20 DIAGNOSIS — S22088D Other fracture of T11-T12 vertebra, subsequent encounter for fracture with routine healing: Secondary | ICD-10-CM | POA: Diagnosis not present

## 2021-10-20 DIAGNOSIS — E538 Deficiency of other specified B group vitamins: Secondary | ICD-10-CM | POA: Diagnosis not present

## 2021-10-20 DIAGNOSIS — G8929 Other chronic pain: Secondary | ICD-10-CM | POA: Diagnosis not present

## 2021-10-20 DIAGNOSIS — Z9181 History of falling: Secondary | ICD-10-CM | POA: Diagnosis not present

## 2021-10-20 DIAGNOSIS — Z87440 Personal history of urinary (tract) infections: Secondary | ICD-10-CM | POA: Diagnosis not present

## 2021-10-20 DIAGNOSIS — G4733 Obstructive sleep apnea (adult) (pediatric): Secondary | ICD-10-CM | POA: Diagnosis not present

## 2021-10-20 DIAGNOSIS — M549 Dorsalgia, unspecified: Secondary | ICD-10-CM | POA: Diagnosis not present

## 2021-10-20 DIAGNOSIS — M1991 Primary osteoarthritis, unspecified site: Secondary | ICD-10-CM | POA: Diagnosis not present

## 2021-10-20 DIAGNOSIS — N3281 Overactive bladder: Secondary | ICD-10-CM | POA: Diagnosis not present

## 2021-10-20 DIAGNOSIS — E785 Hyperlipidemia, unspecified: Secondary | ICD-10-CM | POA: Diagnosis not present

## 2021-10-20 DIAGNOSIS — J9602 Acute respiratory failure with hypercapnia: Secondary | ICD-10-CM | POA: Diagnosis not present

## 2021-10-20 DIAGNOSIS — Z993 Dependence on wheelchair: Secondary | ICD-10-CM | POA: Diagnosis not present

## 2021-10-20 DIAGNOSIS — E559 Vitamin D deficiency, unspecified: Secondary | ICD-10-CM | POA: Diagnosis not present

## 2021-10-20 DIAGNOSIS — S42211D Unspecified displaced fracture of surgical neck of right humerus, subsequent encounter for fracture with routine healing: Secondary | ICD-10-CM | POA: Diagnosis not present

## 2021-10-20 DIAGNOSIS — F32A Depression, unspecified: Secondary | ICD-10-CM | POA: Diagnosis not present

## 2021-10-20 DIAGNOSIS — S52501D Unspecified fracture of the lower end of right radius, subsequent encounter for closed fracture with routine healing: Secondary | ICD-10-CM | POA: Diagnosis not present

## 2021-10-20 DIAGNOSIS — I10 Essential (primary) hypertension: Secondary | ICD-10-CM | POA: Diagnosis not present

## 2021-10-20 DIAGNOSIS — E039 Hypothyroidism, unspecified: Secondary | ICD-10-CM | POA: Diagnosis not present

## 2021-10-20 NOTE — Telephone Encounter (Signed)
VM from [place] w/ [facility] Pt's BP at rest during yesterday's visit was 170/92

## 2021-10-20 NOTE — Telephone Encounter (Signed)
Patient aware and verbalized understanding. °

## 2021-10-20 NOTE — Telephone Encounter (Signed)
Please have her increase her metoprolol from 25 mg to 50 mg daily and monitor it for the next 2 weeks, she can just take 2 tablets and let me know how her blood pressure is running over the next 2 weeks.

## 2021-10-20 NOTE — Telephone Encounter (Signed)
I called patient.  She has been using her CPAP for quite some time.  She is recently diagnosed with an infection which somehow has lowered her oxygen saturation levels.  She was prescribed oxygen and uses it during the day but then also has a bleed into her CPAP which her levels have been she related the 80s.  I relayed that she needs to call her DME that got her the oxygen ,[facility] ,but if they are not able to assist them to call the prescribing physician who ordered the oxygen.  She uses CPAP with [facility] [employer].  She appreciated call back will let us know if we need to do anything.  I will will look for a sooner appointment for her to come in for her CPAP since the last time I saw her was July 2021.  She appreciated callback.

## 2021-10-21 ENCOUNTER — Other Ambulatory Visit: Payer: Self-pay

## 2021-10-21 DIAGNOSIS — E1159 Type 2 diabetes mellitus with other circulatory complications: Secondary | ICD-10-CM

## 2021-10-21 DIAGNOSIS — E039 Hypothyroidism, unspecified: Secondary | ICD-10-CM | POA: Diagnosis not present

## 2021-10-21 DIAGNOSIS — M549 Dorsalgia, unspecified: Secondary | ICD-10-CM | POA: Diagnosis not present

## 2021-10-21 DIAGNOSIS — J9601 Acute respiratory failure with hypoxia: Secondary | ICD-10-CM | POA: Diagnosis not present

## 2021-10-21 DIAGNOSIS — I152 Hypertension secondary to endocrine disorders: Secondary | ICD-10-CM

## 2021-10-21 DIAGNOSIS — M1991 Primary osteoarthritis, unspecified site: Secondary | ICD-10-CM | POA: Diagnosis not present

## 2021-10-21 DIAGNOSIS — Z9181 History of falling: Secondary | ICD-10-CM | POA: Diagnosis not present

## 2021-10-21 DIAGNOSIS — S52501D Unspecified fracture of the lower end of right radius, subsequent encounter for closed fracture with routine healing: Secondary | ICD-10-CM | POA: Diagnosis not present

## 2021-10-21 DIAGNOSIS — E559 Vitamin D deficiency, unspecified: Secondary | ICD-10-CM | POA: Diagnosis not present

## 2021-10-21 DIAGNOSIS — G4733 Obstructive sleep apnea (adult) (pediatric): Secondary | ICD-10-CM | POA: Diagnosis not present

## 2021-10-21 DIAGNOSIS — E785 Hyperlipidemia, unspecified: Secondary | ICD-10-CM | POA: Diagnosis not present

## 2021-10-21 DIAGNOSIS — G8929 Other chronic pain: Secondary | ICD-10-CM | POA: Diagnosis not present

## 2021-10-21 DIAGNOSIS — Z993 Dependence on wheelchair: Secondary | ICD-10-CM | POA: Diagnosis not present

## 2021-10-21 DIAGNOSIS — N3281 Overactive bladder: Secondary | ICD-10-CM | POA: Diagnosis not present

## 2021-10-21 DIAGNOSIS — I10 Essential (primary) hypertension: Secondary | ICD-10-CM | POA: Diagnosis not present

## 2021-10-21 DIAGNOSIS — J9602 Acute respiratory failure with hypercapnia: Secondary | ICD-10-CM | POA: Diagnosis not present

## 2021-10-21 DIAGNOSIS — S22078D Other fracture of T9-T10 vertebra, subsequent encounter for fracture with routine healing: Secondary | ICD-10-CM | POA: Diagnosis not present

## 2021-10-21 DIAGNOSIS — Z87891 Personal history of nicotine dependence: Secondary | ICD-10-CM | POA: Diagnosis not present

## 2021-10-21 DIAGNOSIS — E538 Deficiency of other specified B group vitamins: Secondary | ICD-10-CM | POA: Diagnosis not present

## 2021-10-21 DIAGNOSIS — F32A Depression, unspecified: Secondary | ICD-10-CM | POA: Diagnosis not present

## 2021-10-21 DIAGNOSIS — Z87440 Personal history of urinary (tract) infections: Secondary | ICD-10-CM | POA: Diagnosis not present

## 2021-10-21 DIAGNOSIS — S22088D Other fracture of T11-T12 vertebra, subsequent encounter for fracture with routine healing: Secondary | ICD-10-CM | POA: Diagnosis not present

## 2021-10-21 DIAGNOSIS — S42211D Unspecified displaced fracture of surgical neck of right humerus, subsequent encounter for fracture with routine healing: Secondary | ICD-10-CM | POA: Diagnosis not present

## 2021-10-21 DIAGNOSIS — D649 Anemia, unspecified: Secondary | ICD-10-CM | POA: Diagnosis not present

## 2021-10-24 ENCOUNTER — Ambulatory Visit: Payer: Medicare Other | Admitting: Neurology

## 2021-10-24 DIAGNOSIS — G8929 Other chronic pain: Secondary | ICD-10-CM | POA: Diagnosis not present

## 2021-10-24 DIAGNOSIS — Z87440 Personal history of urinary (tract) infections: Secondary | ICD-10-CM | POA: Diagnosis not present

## 2021-10-24 DIAGNOSIS — J9602 Acute respiratory failure with hypercapnia: Secondary | ICD-10-CM | POA: Diagnosis not present

## 2021-10-24 DIAGNOSIS — G4733 Obstructive sleep apnea (adult) (pediatric): Secondary | ICD-10-CM | POA: Diagnosis not present

## 2021-10-24 DIAGNOSIS — Z87891 Personal history of nicotine dependence: Secondary | ICD-10-CM | POA: Diagnosis not present

## 2021-10-24 DIAGNOSIS — S22078D Other fracture of T9-T10 vertebra, subsequent encounter for fracture with routine healing: Secondary | ICD-10-CM | POA: Diagnosis not present

## 2021-10-24 DIAGNOSIS — S22088D Other fracture of T11-T12 vertebra, subsequent encounter for fracture with routine healing: Secondary | ICD-10-CM | POA: Diagnosis not present

## 2021-10-24 DIAGNOSIS — E039 Hypothyroidism, unspecified: Secondary | ICD-10-CM | POA: Diagnosis not present

## 2021-10-24 DIAGNOSIS — Z9181 History of falling: Secondary | ICD-10-CM | POA: Diagnosis not present

## 2021-10-24 DIAGNOSIS — D649 Anemia, unspecified: Secondary | ICD-10-CM | POA: Diagnosis not present

## 2021-10-24 DIAGNOSIS — I10 Essential (primary) hypertension: Secondary | ICD-10-CM | POA: Diagnosis not present

## 2021-10-24 DIAGNOSIS — S42211D Unspecified displaced fracture of surgical neck of right humerus, subsequent encounter for fracture with routine healing: Secondary | ICD-10-CM | POA: Diagnosis not present

## 2021-10-24 DIAGNOSIS — N3281 Overactive bladder: Secondary | ICD-10-CM | POA: Diagnosis not present

## 2021-10-24 DIAGNOSIS — E785 Hyperlipidemia, unspecified: Secondary | ICD-10-CM | POA: Diagnosis not present

## 2021-10-24 DIAGNOSIS — F32A Depression, unspecified: Secondary | ICD-10-CM | POA: Diagnosis not present

## 2021-10-24 DIAGNOSIS — M549 Dorsalgia, unspecified: Secondary | ICD-10-CM | POA: Diagnosis not present

## 2021-10-24 DIAGNOSIS — E559 Vitamin D deficiency, unspecified: Secondary | ICD-10-CM | POA: Diagnosis not present

## 2021-10-24 DIAGNOSIS — Z993 Dependence on wheelchair: Secondary | ICD-10-CM | POA: Diagnosis not present

## 2021-10-24 DIAGNOSIS — J9601 Acute respiratory failure with hypoxia: Secondary | ICD-10-CM | POA: Diagnosis not present

## 2021-10-24 DIAGNOSIS — M1991 Primary osteoarthritis, unspecified site: Secondary | ICD-10-CM | POA: Diagnosis not present

## 2021-10-24 DIAGNOSIS — E538 Deficiency of other specified B group vitamins: Secondary | ICD-10-CM | POA: Diagnosis not present

## 2021-10-24 DIAGNOSIS — S52501D Unspecified fracture of the lower end of right radius, subsequent encounter for closed fracture with routine healing: Secondary | ICD-10-CM | POA: Diagnosis not present

## 2021-10-25 ENCOUNTER — Encounter: Payer: Self-pay | Admitting: Adult Health

## 2021-10-25 ENCOUNTER — Ambulatory Visit (INDEPENDENT_AMBULATORY_CARE_PROVIDER_SITE_OTHER): Payer: Medicare Other | Admitting: Adult Health

## 2021-10-25 VITALS — BP 117/60 | HR 53 | Ht 63.0 in

## 2021-10-25 DIAGNOSIS — H26493 Other secondary cataract, bilateral: Secondary | ICD-10-CM | POA: Diagnosis not present

## 2021-10-25 DIAGNOSIS — Z9989 Dependence on other enabling machines and devices: Secondary | ICD-10-CM | POA: Diagnosis not present

## 2021-10-25 DIAGNOSIS — H04123 Dry eye syndrome of bilateral lacrimal glands: Secondary | ICD-10-CM | POA: Diagnosis not present

## 2021-10-25 DIAGNOSIS — H47322 Drusen of optic disc, left eye: Secondary | ICD-10-CM | POA: Diagnosis not present

## 2021-10-25 DIAGNOSIS — G4733 Obstructive sleep apnea (adult) (pediatric): Secondary | ICD-10-CM

## 2021-10-25 DIAGNOSIS — H43811 Vitreous degeneration, right eye: Secondary | ICD-10-CM | POA: Diagnosis not present

## 2021-10-25 NOTE — Progress Notes (Signed)
PATIENT: Danielle Parrish DOB: [birth year]  REASON FOR VISIT: follow up HISTORY FROM: patient   HISTORY OF PRESENT ILLNESS: Today 10/25/21: Danielle Parrish is an 80 year old female with a history of obstructive sleep apnea on CPAP.  She returns today for follow-up.  She reports that she is now on nocturnal oxygen.  She states that she did not feel like it was working right with her CPAP so she stopped using the CPAP machine in the last 2 weeks.  Her download is below    HISTORY   REVIEW OF SYSTEMS: Out of a complete 14 system review of symptoms, the patient complains only of the following symptoms, and all other reviewed systems are negative.  FSS ESS  ALLERGIES: Allergies  Allergen Reactions   Crestor [Rosuvastatin] Other (See Comments)    Body aches.    Nsaids     Burns stomach.    Vytorin [Ezetimibe-Simvastatin]     Body aches.    Aspirin Nausea Only   Tolmetin Nausea Only    Burns stomach.     HOME MEDICATIONS: Outpatient Medications Prior to Visit  Medication Sig Dispense Refill   acetaminophen (TYLENOL) 500 MG tablet Take 1 tablet (500 mg total) by mouth every 6 (six) hours as needed. 30 tablet 0   albuterol (VENTOLIN HFA) 108 (90 Base) MCG/ACT inhaler TAKE 2 PUFFS BY MOUTH EVERY 6 HOURS AS NEEDED FOR WHEEZE OR SHORTNESS OF BREATH 6.7 each 3   amLODipine (NORVASC) 10 MG tablet Take 1 tablet (10 mg total) by mouth daily. 90 tablet 3   calcium-vitamin D (OSCAL WITH D) 500-200 MG-UNIT TABS tablet TAKE 1 TABLET BY MOUTH EVERY DAY WITH BREAKFAST 90 tablet 1   Cholecalciferol (VITAMIN D-3 PO) Take 1 capsule by mouth daily.      cyclobenzaprine (FLEXERIL) 5 MG tablet Take 1 tablet (5 mg total) by mouth 3 (three) times daily as needed for muscle spasms. 30 tablet 1   denosumab (PROLIA) 60 MG/ML SOSY injection Bring to your doctor to inject 60 mg subcutaneously. 1 mL 3   diclofenac Sodium (VOLTAREN) 1 % GEL APPLY 4 GRAMS TOPICALLY 4 (FOUR) TIMES DAILY. 400 g 1    diphenhydrAMINE (BENADRYL) 25 MG tablet Take 50 mg by mouth at bedtime as needed for allergies.      doxycycline (VIBRA-TABS) 100 MG tablet Take 1 tablet (100 mg total) by mouth 2 (two) times daily. 20 tablet 0   fluconazole (DIFLUCAN) 150 MG tablet Take 1 tablet (150 mg total) by mouth every three (3) days as needed. 3 tablet 0   gabapentin (NEURONTIN) 300 MG capsule Take 300 mg by mouth 3 (three) times daily.     HYDROcodone-acetaminophen (NORCO/VICODIN) 5-325 MG tablet Take 1 tablet by mouth daily as needed for moderate pain. 30 tablet 0   ketoconazole (NIZORAL) 2 % cream APPLY TO AFFECTED AREA EVERYDAY 60 g 2   levothyroxine (SYNTHROID) 100 MCG tablet Take 1 tablet (100 mcg total) by mouth daily before breakfast. 90 tablet 3   lidocaine (LIDODERM) 5 % PLACE 1 PATCH ONTO THE SKIN DAILY. REMOVE & DISCARD PATCH WITHIN 12 HOURS OR AS DIRECTED BY MD 30 patch 0   lisinopril (ZESTRIL) 20 MG tablet Take 1 tablet (20 mg total) by mouth daily. 90 tablet 3   lovastatin (MEVACOR) 40 MG tablet TAKE 1 TABLET BY MOUTH EVERYDAY AT BEDTIME 90 tablet 3   metoprolol succinate (TOPROL-XL) 25 MG 24 hr tablet Take 1 tablet (25 mg total) by mouth daily. [place]  tablet 3   mirabegron ER (MYRBETRIQ) 25 MG TB24 tablet Take 1 tablet (25 mg total) by mouth daily. 30 tablet 3   Multiple Vitamin (MULTI-VITAMIN) tablet Take by mouth.     Multiple Vitamins-Minerals (PRESERVISION AREDS 2 PO) Take by mouth.     nitrofurantoin, macrocrystal-monohydrate, (MACROBID) 100 MG capsule Take 100 mg by mouth daily.     nystatin (MYCOSTATIN/NYSTOP) powder Apply topically 4 (four) times daily. 15 g 0   nystatin cream (MYCOSTATIN) Apply 1 application topically 2 (two) times daily. 30 g PRN   omeprazole (PRILOSEC) 20 MG capsule Take 1 capsule (20 mg total) by mouth 2 (two) times daily before a meal. 180 capsule 1   predniSONE (STERAPRED UNI-PAK 21 TAB) 10 MG (21) TBPK tablet Use as directed 21 tablet 0   PROLIA 60 MG/ML SOSY injection TO BE  ADMINISTERED IN PHYSICIAN'S OFFICE. INJECT ONE SYRINGE SUBCOUSLY ONCE EVERY 6 MONTHS. REFRIGERATE. USE WITHIN 14 DAYS ONCE AT ROOM TEMPERATURE. 1 mL 1   pyridOXINE (VITAMIN B-6) 100 MG tablet Take 100 mg by mouth daily.     sertraline (ZOLOFT) 100 MG tablet Take 2 tablets (200 mg total) by mouth daily. 180 tablet 3   sodium chloride (OCEAN) 0.65 % SOLN nasal spray Place 1 spray into both nostrils as needed for congestion. 1 Bottle prn   No facility-administered medications prior to visit.    PAST MEDICAL HISTORY: Past Medical History:  Diagnosis Date   Anxiety    Arthritis    CHF (congestive heart failure) (HCC)    Chronic pain of multiple joints    Depression    Essential hypertension    GERD (gastroesophageal reflux disease)    Hyperlipidemia    Hyperlipidemia associated with type 2 diabetes mellitus ([place])    Hypertension associated with type 2 diabetes mellitus ([place])    Hypothyroidism    Mild scoliosis 10/19/2021   Osteoporosis    Sleep apnea    CPAP   Type 2 diabetes mellitus with hypercholesterolemia ([place])    Vitamin D deficiency     PAST SURGICAL HISTORY: Past Surgical History:  Procedure Laterality Date   ABDOMINAL HERNIA REPAIR     ABDOMINAL HYSTERECTOMY  1977   BREAST EXCISIONAL BIOPSY Right    EYE SURGERY  2020   bilateral cataract removal    KNEE ARTHROSCOPY Left 09/30/2014   Procedure: LEFT KNEE ARTHROSCOPY MEDIAL MENISECTOMY ABRASION CHONDROPLASTY SYNOVECTOMY SUPRAPATELLER CUFF;  Surgeon: Tobi Bastos, MD;  Location: [facility];  Service: Orthopedics;  Laterality: Left;    FAMILY HISTORY: Family History  Problem Relation Age of Onset   Hypertension Mother    Stroke Mother    Cancer Father     SOCIAL HISTORY: Social History   Socioeconomic History   Marital status: Widowed    Spouse name: Laverna Peace   Number of children: 2   Years of education: Not on file   Highest education level: High school graduate  Occupational History   Occupation: CNA     Comment: [employer]   Occupation: retired  Tobacco Use   Smoking status: Never   Smokeless tobacco: Never  Scientific laboratory technician Use: Never used  Substance and Sexual Activity   Alcohol use: No   Drug use: No   Sexual activity: Not Currently  Other Topics Concern   Not on file  Social History Narrative   Lives with her daughter   Social Determinants of Health   Financial Resource Strain: Low Risk    Difficulty  of Paying Living Expenses: Not very hard  Food Insecurity: No Food Insecurity   Worried About [place] in the Last Year: Never true   Ran Out of Food in the Last Year: Never true  Transportation Needs: No Transportation Needs   Lack of Transportation (Medical): No   Lack of Transportation (Non-Medical): No  Physical Activity: Insufficiently Active   Days of Exercise per Week: 3 days   Minutes of Exercise per Session: 20 min  Stress: No Stress Concern Present   Feeling of Stress : Not at all  Social Connections: Socially Isolated   Frequency of Communication with Friends and Family: More than three times a week   Frequency of Social Gatherings with Friends and Family: More than three times a week   Attends Religious Services: Never   Marine scientist or Organizations: No   Attends Archivist Meetings: Never   Marital Status: Widowed  Intimate Partner Violence: Not At Risk   Fear of Current or Ex-Partner: No   Emotionally Abused: No   Physically Abused: No   Sexually Abused: No      PHYSICAL EXAM  Vitals:   10/25/21 1107  BP: 117/60  Pulse: (!) 53  Height: 5\' 3"  (1.6 m)   Body mass index is 31.25 kg/m.  Generalized: Well developed, in no acute distress  Chest: Lungs clear to auscultation bilaterally  Neurological examination  Mentation: Alert oriented to time, place, history taking. Follows all commands speech and language fluent Cranial nerve II-XII: Extraocular movements were full, visual field were full on  confrontational test Head turning and shoulder shrug  were normal and symmetric. Motor: The motor testing reveals 5 over 5 strength of all 4 extremities. Good symmetric motor tone is noted throughout.  Sensory: Sensory testing is intact to soft touch on all 4 extremities. No evidence of extinction is noted.  Gait and station: Gait is normal.    DIAGNOSTIC DATA (LABS, IMAGING, TESTING) - I reviewed patient records, labs, notes, testing and imaging myself where available.  Lab Results  Component Value Date   WBC 5.1 09/14/2021   HGB 12.5 09/14/2021   HCT 39.3 09/14/2021   MCV 98 (H) 09/14/2021   PLT 211 09/14/2021      Component Value Date/Time   NA 138 09/14/2021 1517   K 4.1 09/14/2021 1517   CL 96 09/14/2021 1517   CO2 28 09/14/2021 1517   GLUCOSE 103 (H) 09/14/2021 1517   GLUCOSE 133 (H) 10/17/2019 2211   BUN 7 (L) 09/14/2021 1517   CREATININE 0.58 09/14/2021 1517   CREATININE 0.86 05/15/2013 0941   CALCIUM 10.6 (H) 09/14/2021 1517   PROT 6.8 09/14/2021 1517   ALBUMIN 4.4 09/14/2021 1517   AST 12 09/14/2021 1517   ALT 12 09/14/2021 1517   ALKPHOS 107 09/14/2021 1517   BILITOT 0.3 09/14/2021 1517   GFRNONAA 87 06/07/2020 1158   GFRNONAA 68 05/15/2013 0941   GFRAA 101 06/07/2020 1158   GFRAA 78 05/15/2013 0941   Lab Results  Component Value Date   CHOL 173 04/21/2021   HDL 49 04/21/2021   LDLCALC 91 04/21/2021   TRIG 191 (H) 04/21/2021   CHOLHDL 3.5 04/21/2021   Lab Results  Component Value Date   HGBA1C 6.1 04/21/2021   Lab Results  Component Value Date   UXNATFTD32 202 06/07/2021   Lab Results  Component Value Date   TSH 0.978 07/29/2021      ASSESSMENT AND PLAN 80 y.o. year  old female  has a past medical history of Anxiety, Arthritis, CHF (congestive heart failure) ([place]), Chronic pain of multiple joints, Depression, Essential hypertension, GERD (gastroesophageal reflux disease), Hyperlipidemia, Hyperlipidemia associated with type 2 diabetes mellitus  ([place]), Hypertension associated with type 2 diabetes mellitus ([place]), Hypothyroidism, Mild scoliosis (10/19/2021), Osteoporosis, Sleep apnea, Type 2 diabetes mellitus with hypercholesterolemia ([place]), and Vitamin D deficiency. here with:  OSA on CPAP  - CPAP compliance suboptimal - Good treatment of AHI  - Encourage patient to use CPAP nightly and > 4 hours each night -Advised patient if she feels that the CPAP is not working correctly with her oxygen she should contact her DME company - F/U in 1 year or sooner if needed   Ward Givens, MSN, NP-C 10/25/2021, 10:52 AM [facility] [address], [place], [place] [address] [phone]

## 2021-10-27 DIAGNOSIS — Z993 Dependence on wheelchair: Secondary | ICD-10-CM | POA: Diagnosis not present

## 2021-10-27 DIAGNOSIS — S52501D Unspecified fracture of the lower end of right radius, subsequent encounter for closed fracture with routine healing: Secondary | ICD-10-CM | POA: Diagnosis not present

## 2021-10-27 DIAGNOSIS — Z87891 Personal history of nicotine dependence: Secondary | ICD-10-CM | POA: Diagnosis not present

## 2021-10-27 DIAGNOSIS — J9602 Acute respiratory failure with hypercapnia: Secondary | ICD-10-CM | POA: Diagnosis not present

## 2021-10-27 DIAGNOSIS — G8929 Other chronic pain: Secondary | ICD-10-CM | POA: Diagnosis not present

## 2021-10-27 DIAGNOSIS — Z87440 Personal history of urinary (tract) infections: Secondary | ICD-10-CM | POA: Diagnosis not present

## 2021-10-27 DIAGNOSIS — S22088D Other fracture of T11-T12 vertebra, subsequent encounter for fracture with routine healing: Secondary | ICD-10-CM | POA: Diagnosis not present

## 2021-10-27 DIAGNOSIS — S42211D Unspecified displaced fracture of surgical neck of right humerus, subsequent encounter for fracture with routine healing: Secondary | ICD-10-CM | POA: Diagnosis not present

## 2021-10-27 DIAGNOSIS — Z9181 History of falling: Secondary | ICD-10-CM | POA: Diagnosis not present

## 2021-10-27 DIAGNOSIS — G4733 Obstructive sleep apnea (adult) (pediatric): Secondary | ICD-10-CM | POA: Diagnosis not present

## 2021-10-27 DIAGNOSIS — D649 Anemia, unspecified: Secondary | ICD-10-CM | POA: Diagnosis not present

## 2021-10-27 DIAGNOSIS — M1991 Primary osteoarthritis, unspecified site: Secondary | ICD-10-CM | POA: Diagnosis not present

## 2021-10-27 DIAGNOSIS — I10 Essential (primary) hypertension: Secondary | ICD-10-CM | POA: Diagnosis not present

## 2021-10-27 DIAGNOSIS — E785 Hyperlipidemia, unspecified: Secondary | ICD-10-CM | POA: Diagnosis not present

## 2021-10-27 DIAGNOSIS — M549 Dorsalgia, unspecified: Secondary | ICD-10-CM | POA: Diagnosis not present

## 2021-10-27 DIAGNOSIS — S22078D Other fracture of T9-T10 vertebra, subsequent encounter for fracture with routine healing: Secondary | ICD-10-CM | POA: Diagnosis not present

## 2021-10-27 DIAGNOSIS — F32A Depression, unspecified: Secondary | ICD-10-CM | POA: Diagnosis not present

## 2021-10-27 DIAGNOSIS — J9601 Acute respiratory failure with hypoxia: Secondary | ICD-10-CM | POA: Diagnosis not present

## 2021-10-27 DIAGNOSIS — N3281 Overactive bladder: Secondary | ICD-10-CM | POA: Diagnosis not present

## 2021-10-27 DIAGNOSIS — E559 Vitamin D deficiency, unspecified: Secondary | ICD-10-CM | POA: Diagnosis not present

## 2021-10-27 DIAGNOSIS — E538 Deficiency of other specified B group vitamins: Secondary | ICD-10-CM | POA: Diagnosis not present

## 2021-10-27 DIAGNOSIS — E039 Hypothyroidism, unspecified: Secondary | ICD-10-CM | POA: Diagnosis not present

## 2021-10-28 ENCOUNTER — Ambulatory Visit (INDEPENDENT_AMBULATORY_CARE_PROVIDER_SITE_OTHER): Payer: Medicare Other | Admitting: Nurse Practitioner

## 2021-10-28 ENCOUNTER — Encounter: Payer: Self-pay | Admitting: Nurse Practitioner

## 2021-10-28 ENCOUNTER — Other Ambulatory Visit: Payer: Self-pay

## 2021-10-28 ENCOUNTER — Ambulatory Visit: Payer: Medicare Other | Admitting: Family

## 2021-10-28 ENCOUNTER — Telehealth: Payer: Self-pay

## 2021-10-28 VITALS — BP 125/75 | HR 56 | Temp 97.5°F | Ht 63.0 in | Wt 176.4 lb

## 2021-10-28 DIAGNOSIS — Z993 Dependence on wheelchair: Secondary | ICD-10-CM | POA: Diagnosis not present

## 2021-10-28 DIAGNOSIS — E039 Hypothyroidism, unspecified: Secondary | ICD-10-CM | POA: Diagnosis not present

## 2021-10-28 DIAGNOSIS — J9601 Acute respiratory failure with hypoxia: Secondary | ICD-10-CM | POA: Diagnosis not present

## 2021-10-28 DIAGNOSIS — G8929 Other chronic pain: Secondary | ICD-10-CM | POA: Diagnosis not present

## 2021-10-28 DIAGNOSIS — M549 Dorsalgia, unspecified: Secondary | ICD-10-CM | POA: Diagnosis not present

## 2021-10-28 DIAGNOSIS — D649 Anemia, unspecified: Secondary | ICD-10-CM | POA: Diagnosis not present

## 2021-10-28 DIAGNOSIS — S22078D Other fracture of T9-T10 vertebra, subsequent encounter for fracture with routine healing: Secondary | ICD-10-CM | POA: Diagnosis not present

## 2021-10-28 DIAGNOSIS — E538 Deficiency of other specified B group vitamins: Secondary | ICD-10-CM | POA: Diagnosis not present

## 2021-10-28 DIAGNOSIS — Z9181 History of falling: Secondary | ICD-10-CM | POA: Diagnosis not present

## 2021-10-28 DIAGNOSIS — N3281 Overactive bladder: Secondary | ICD-10-CM | POA: Diagnosis not present

## 2021-10-28 DIAGNOSIS — Z87440 Personal history of urinary (tract) infections: Secondary | ICD-10-CM | POA: Diagnosis not present

## 2021-10-28 DIAGNOSIS — E785 Hyperlipidemia, unspecified: Secondary | ICD-10-CM | POA: Diagnosis not present

## 2021-10-28 DIAGNOSIS — E559 Vitamin D deficiency, unspecified: Secondary | ICD-10-CM | POA: Diagnosis not present

## 2021-10-28 DIAGNOSIS — G4733 Obstructive sleep apnea (adult) (pediatric): Secondary | ICD-10-CM | POA: Diagnosis not present

## 2021-10-28 DIAGNOSIS — F32A Depression, unspecified: Secondary | ICD-10-CM | POA: Diagnosis not present

## 2021-10-28 DIAGNOSIS — S42211D Unspecified displaced fracture of surgical neck of right humerus, subsequent encounter for fracture with routine healing: Secondary | ICD-10-CM | POA: Diagnosis not present

## 2021-10-28 DIAGNOSIS — I10 Essential (primary) hypertension: Secondary | ICD-10-CM | POA: Diagnosis not present

## 2021-10-28 DIAGNOSIS — S22088D Other fracture of T11-T12 vertebra, subsequent encounter for fracture with routine healing: Secondary | ICD-10-CM | POA: Diagnosis not present

## 2021-10-28 DIAGNOSIS — Z87891 Personal history of nicotine dependence: Secondary | ICD-10-CM | POA: Diagnosis not present

## 2021-10-28 DIAGNOSIS — J9602 Acute respiratory failure with hypercapnia: Secondary | ICD-10-CM | POA: Diagnosis not present

## 2021-10-28 DIAGNOSIS — S52501D Unspecified fracture of the lower end of right radius, subsequent encounter for closed fracture with routine healing: Secondary | ICD-10-CM | POA: Diagnosis not present

## 2021-10-28 DIAGNOSIS — M1991 Primary osteoarthritis, unspecified site: Secondary | ICD-10-CM | POA: Diagnosis not present

## 2021-10-28 MED ORDER — METHYLPREDNISOLONE ACETATE 40 MG/ML IJ SUSP
40.0000 mg | Freq: Once | INTRAMUSCULAR | Status: AC
Start: 2021-10-28 — End: 2021-10-28
  Administered 2021-10-28: 40 mg via INTRAMUSCULAR

## 2021-10-28 NOTE — Telephone Encounter (Signed)
Pt had appt today with je for cronic back pain and was requesting to speak to pcp about wheelchair. She says she really would benefit having one since she cant walk very well due to constant back pain

## 2021-10-28 NOTE — Assessment & Plan Note (Signed)
Chronic lower back pain symptoms not well controlled.  Patient is reporting worsening pain and in the past few days is losing her ability to walk.  Patient uses a walker from the parking lot into the clinic today and is needing a wheelchair. On assessment pain is a 10 out of 10 with aching, swelling and tenderness and limited range of motion.  Continue Voltaren gel, 40 Depo-Medrol shot given in clinic.  Completed DME for wheelchair.  Follow-up with worsening unresolved symptoms.

## 2021-10-28 NOTE — Progress Notes (Signed)
Acute Office Visit  Subjective:    Patient ID: Danielle Parrish, female    DOB: 06-24-1941, 80 y.o.   MRN: [id number]  Chief Complaint  Patient presents with   Back Pain    Back Pain  Patient is in today for Pain  She reports chronic lower back pain. was not an injury that may have caused the pain. The pain started a few years ago and is gradually worsening. The pain does not radiate . The pain is described as aching and soreness, is moderate, severe, and 10/10 in intensity, occurring constantly. Symptoms are worse in the: morning, mid-day, afternoon, evening  Aggravating factors: bending forwards and walking Relieving factors: none.  She has tried application of ice and NSAIDs with mild relief.    Past Medical History:  Diagnosis Date   Anxiety    Arthritis    CHF (congestive heart failure) (HCC)    Chronic pain of multiple joints    Depression    Essential hypertension    GERD (gastroesophageal reflux disease)    Hyperlipidemia    Hyperlipidemia associated with type 2 diabetes mellitus ([place])    Hypertension associated with type 2 diabetes mellitus ([place])    Hypothyroidism    Mild scoliosis 10/19/2021   Osteoporosis    Sleep apnea    CPAP   Type 2 diabetes mellitus with hypercholesterolemia ([place])    Vitamin D deficiency     Past Surgical History:  Procedure Laterality Date   ABDOMINAL HERNIA REPAIR     ABDOMINAL HYSTERECTOMY  1977   BREAST EXCISIONAL BIOPSY Right    EYE SURGERY  2020   bilateral cataract removal    KNEE ARTHROSCOPY Left 09/30/2014   Procedure: LEFT KNEE ARTHROSCOPY MEDIAL MENISECTOMY ABRASION CHONDROPLASTY SYNOVECTOMY SUPRAPATELLER CUFF;  Surgeon: Tobi Bastos, MD;  Location: [facility];  Service: Orthopedics;  Laterality: Left;    Family History  Problem Relation Age of Onset   Hypertension Mother    Stroke Mother    Cancer Father     Social History   Socioeconomic History   Marital status: Widowed    Spouse name: Laverna Peace   Number of  children: 2   Years of education: Not on file   Highest education level: High school graduate  Occupational History   Occupation: CNA    Comment: [employer]   Occupation: retired  Tobacco Use   Smoking status: Never   Smokeless tobacco: Never  Scientific laboratory technician Use: Never used  Substance and Sexual Activity   Alcohol use: No   Drug use: No   Sexual activity: Not Currently  Other Topics Concern   Not on file  Social History Narrative   Lives with her daughter   Social Determinants of Health   Financial Resource Strain: Low Risk    Difficulty of Paying Living Expenses: Not very hard  Food Insecurity: No Food Insecurity   Worried About Charity fundraiser in the Last Year: Never true   [place] in the Last Year: Never true  Transportation Needs: No Transportation Needs   Lack of Transportation (Medical): No   Lack of Transportation (Non-Medical): No  Physical Activity: Insufficiently Active   Days of Exercise per Week: 3 days   Minutes of Exercise per Session: 20 min  Stress: No Stress Concern Present   Feeling of Stress : Not at all  Social Connections: Socially Isolated   Frequency of Communication with Friends and Family: More than three times  a week   Frequency of Social Gatherings with Friends and Family: More than three times a week   Attends Religious Services: Never   Marine scientist or Organizations: No   Attends Archivist Meetings: Never   Marital Status: Widowed  Human resources officer Violence: Not At Risk   Fear of Current or Ex-Partner: No   Emotionally Abused: No   Physically Abused: No   Sexually Abused: No    Outpatient Medications Prior to Visit  Medication Sig Dispense Refill   acetaminophen (TYLENOL) 500 MG tablet Take 1 tablet (500 mg total) by mouth every 6 (six) hours as needed. 30 tablet 0   albuterol (VENTOLIN HFA) 108 (90 Base) MCG/ACT inhaler TAKE 2 PUFFS BY MOUTH EVERY 6 HOURS AS NEEDED FOR WHEEZE OR SHORTNESS OF  BREATH 6.7 each 3   amLODipine (NORVASC) 10 MG tablet Take 1 tablet (10 mg total) by mouth daily. 90 tablet 3   calcium-vitamin D (OSCAL WITH D) 500-200 MG-UNIT TABS tablet TAKE 1 TABLET BY MOUTH EVERY DAY WITH BREAKFAST 90 tablet 1   Cholecalciferol (VITAMIN D-3 PO) Take 1 capsule by mouth daily.      cyclobenzaprine (FLEXERIL) 5 MG tablet Take 1 tablet (5 mg total) by mouth 3 (three) times daily as needed for muscle spasms. 30 tablet 1   denosumab (PROLIA) 60 MG/ML SOSY injection Bring to your doctor to inject 60 mg subcutaneously. 1 mL 3   diclofenac Sodium (VOLTAREN) 1 % GEL APPLY 4 GRAMS TOPICALLY 4 (FOUR) TIMES DAILY. 400 g 1   diphenhydrAMINE (BENADRYL) 25 MG tablet Take 50 mg by mouth at bedtime as needed for allergies.      fluconazole (DIFLUCAN) 150 MG tablet Take 1 tablet (150 mg total) by mouth every three (3) days as needed. 3 tablet 0   gabapentin (NEURONTIN) 300 MG capsule Take 300 mg by mouth 3 (three) times daily.     HYDROcodone-acetaminophen (NORCO/VICODIN) 5-325 MG tablet Take 1 tablet by mouth daily as needed for moderate pain. 30 tablet 0   ketoconazole (NIZORAL) 2 % cream APPLY TO AFFECTED AREA EVERYDAY 60 g 2   levothyroxine (SYNTHROID) 100 MCG tablet Take 1 tablet (100 mcg total) by mouth daily before breakfast. 90 tablet 3   lidocaine (LIDODERM) 5 % PLACE 1 PATCH ONTO THE SKIN DAILY. REMOVE & DISCARD PATCH WITHIN 12 HOURS OR AS DIRECTED BY MD 30 patch 0   lisinopril (ZESTRIL) 20 MG tablet Take 1 tablet (20 mg total) by mouth daily. 90 tablet 3   lovastatin (MEVACOR) 40 MG tablet TAKE 1 TABLET BY MOUTH EVERYDAY AT BEDTIME 90 tablet 3   metoprolol succinate (TOPROL-XL) 25 MG 24 hr tablet Take 1 tablet (25 mg total) by mouth daily. 90 tablet 3   mirabegron ER (MYRBETRIQ) 25 MG TB24 tablet Take 1 tablet (25 mg total) by mouth daily. 30 tablet 3   Multiple Vitamin (MULTI-VITAMIN) tablet Take by mouth.     Multiple Vitamins-Minerals (PRESERVISION AREDS 2 PO) Take by mouth.      nitrofurantoin, macrocrystal-monohydrate, (MACROBID) 100 MG capsule Take 100 mg by mouth daily.     nystatin (MYCOSTATIN/NYSTOP) powder Apply topically 4 (four) times daily. 15 g 0   nystatin cream (MYCOSTATIN) Apply 1 application topically 2 (two) times daily. 30 g PRN   omeprazole (PRILOSEC) 20 MG capsule Take 1 capsule (20 mg total) by mouth 2 (two) times daily before a meal. 180 capsule 1   PROLIA 60 MG/ML SOSY injection TO BE ADMINISTERED  IN PHYSICIAN'S OFFICE. INJECT ONE SYRINGE SUBCOUSLY ONCE EVERY 6 MONTHS. REFRIGERATE. USE WITHIN 14 DAYS ONCE AT ROOM TEMPERATURE. 1 mL 1   pyridOXINE (VITAMIN B-6) 100 MG tablet Take 100 mg by mouth daily.     sertraline (ZOLOFT) 100 MG tablet Take 2 tablets (200 mg total) by mouth daily. 180 tablet 3   sodium chloride (OCEAN) 0.65 % SOLN nasal spray Place 1 spray into both nostrils as needed for congestion. 1 Bottle prn   No facility-administered medications prior to visit.    Allergies  Allergen Reactions   Crestor [Rosuvastatin] Other (See Comments)    Body aches.    Nsaids     Burns stomach.    Vytorin [Ezetimibe-Simvastatin]     Body aches.    Aspirin Nausea Only   Tolmetin Nausea Only    Burns stomach.     Review of Systems  Constitutional: Negative.   HENT: Negative.    Eyes: Negative.   Respiratory: Negative.    Cardiovascular: Negative.   Gastrointestinal: Negative.   Musculoskeletal:  Positive for back pain.  All other systems reviewed and are negative.     Objective:    Physical Exam Vitals and nursing note reviewed.  Constitutional:      Appearance: Normal appearance.  HENT:     Head: Normocephalic.     Right Ear: External ear normal.     Left Ear: External ear normal.     Nose: Nose normal.     Mouth/Throat:     Pharynx: Oropharynx is clear.  Eyes:     Conjunctiva/sclera: Conjunctivae normal.  Cardiovascular:     Rate and Rhythm: Normal rate and regular rhythm.     Pulses: Normal pulses.     Heart sounds:  Normal heart sounds.  Pulmonary:     Effort: Pulmonary effort is normal.     Breath sounds: Normal breath sounds.  Abdominal:     General: Bowel sounds are normal.  Musculoskeletal:     Lumbar back: Tenderness present. Decreased range of motion.  Skin:    General: Skin is warm.     Findings: No rash.  Neurological:     Mental Status: She is alert and oriented to person, place, and time.    BP 125/75   Pulse (!) 56   Temp (!) 97.5 F (36.4 C)   Ht 5' 3" (1.6 m)   Wt 176 lb 6.4 oz (80 kg)   SpO2 94%   BMI 31.25 kg/m  Wt Readings from Last 3 Encounters:  10/28/21 176 lb 6.4 oz (80 kg)  10/17/21 176 lb 6.4 oz (80 kg)  10/14/21 181 lb 3.2 oz (82.2 kg)    Health Maintenance Due  Topic Date Due   OPHTHALMOLOGY EXAM  10/18/2021   HEMOGLOBIN A1C  10/21/2021    There are no preventive care reminders to display for this patient.   Lab Results  Component Value Date   TSH 0.978 07/29/2021   Lab Results  Component Value Date   WBC 5.1 09/14/2021   HGB 12.5 09/14/2021   HCT 39.3 09/14/2021   MCV 98 (H) 09/14/2021   PLT 211 09/14/2021   Lab Results  Component Value Date   NA 138 09/14/2021   K 4.1 09/14/2021   CO2 28 09/14/2021   GLUCOSE 103 (H) 09/14/2021   BUN 7 (L) 09/14/2021   CREATININE 0.58 09/14/2021   BILITOT 0.3 09/14/2021   ALKPHOS 107 09/14/2021   AST 12 09/14/2021   ALT 12 09/14/2021  PROT 6.8 09/14/2021   ALBUMIN 4.4 09/14/2021   CALCIUM 10.6 (H) 09/14/2021   ANIONGAP 11 10/17/2019   EGFR 91 09/14/2021   Lab Results  Component Value Date   CHOL 173 04/21/2021   Lab Results  Component Value Date   HDL 49 04/21/2021   Lab Results  Component Value Date   LDLCALC 91 04/21/2021   Lab Results  Component Value Date   TRIG 191 (H) 04/21/2021   Lab Results  Component Value Date   CHOLHDL 3.5 04/21/2021   Lab Results  Component Value Date   HGBA1C 6.1 04/21/2021       Assessment & Plan:   Problem List Items Addressed This Visit        Other   Chronic bilateral back pain - Primary    Chronic lower back pain symptoms not well controlled.  Patient is reporting worsening pain and in the past few days is losing her ability to walk.  Patient uses a walker from the parking lot into the clinic today and is needing a wheelchair. On assessment pain is a 10 out of 10 with aching, swelling and tenderness and limited range of motion.  Continue Voltaren gel, 40 Depo-Medrol shot given in clinic.  Completed DME for wheelchair.  Follow-up with worsening unresolved symptoms.       Relevant Orders   For home use only DME Other see comment     Meds ordered this encounter  Medications   methylPREDNISolone acetate (DEPO-MEDROL) injection 40 mg     Ivy Lynn, NP

## 2021-10-28 NOTE — Patient Instructions (Signed)
Chronic Back Pain When back pain lasts longer than 3 months, it is called chronic back pain. Pain may get worse at certain times (flare-ups). There are things you can do at home to manage your pain. Follow these instructions at home: Pay attention to any changes in your symptoms. Take these actions to help with your pain: Managing pain and stiffness   If told, put ice on the painful area. Your doctor may tell you to use ice for 24-48 hours after the flare-up starts. To do this: Put ice in a plastic bag. Place a towel between your skin and the bag. Leave the ice on for 20 minutes, 2-3 times a day. If told, put heat on the painful area. Do this as often as told by your doctor. Use the heat source that your doctor recommends, such as a moist heat pack or a heating pad. Place a towel between your skin and the heat source. Leave the heat on for 20-30 minutes. Take off the heat if your skin turns bright red. This is especially important if you are unable to feel pain, heat, or cold. You may have a greater risk of getting burned. Soak in a warm bath. This can help relieve pain. Activity  Avoid bending and other activities that make pain worse. When standing: Keep your upper back and neck straight. Keep your shoulders pulled back. Avoid slouching. When sitting: Keep your back straight. Relax your shoulders. Do not round your shoulders or pull them backward. Do not sit or stand in one place for long periods of time. Take short rest breaks during the day. Lying down or standing is usually better than sitting. Resting can help relieve pain. When sitting or lying down for a long time, do some mild activity or stretching. This will help to prevent stiffness and pain. Get regular exercise. Ask your doctor what activities are safe for you. Do not lift anything that is heavier than 10 lb (4.5 kg) or the limit that you are told, until your doctor says that it is safe. To prevent injury when you lift  things: Bend your knees. Keep the weight close to your body. Avoid twisting. Sleep on a firm mattress. Try lying on your side with your knees slightly bent. If you lie on your back, put a pillow under your knees. Medicines Treatment may include medicines for pain and swelling taken by mouth or put on the skin, prescription pain medicine, or muscle relaxants. Take over-the-counter and prescription medicines only as told by your doctor. Ask your doctor if the medicine prescribed to you: Requires you to avoid driving or using machinery. Can cause trouble pooping (constipation). You may need to take these actions to prevent or treat trouble pooping: Drink enough fluid to keep your pee (urine) pale yellow. Take over-the-counter or prescription medicines. Eat foods that are high in fiber. These include beans, whole grains, and fresh fruits and vegetables. Limit foods that are high in fat and sugars. These include fried or sweet foods. General instructions Do not use any products that contain nicotine or tobacco, such as cigarettes, e-cigarettes, and chewing tobacco. If you need help quitting, ask your doctor. Keep all follow-up visits as told by your doctor. This is important. Contact a doctor if: Your pain does not get better with rest or medicine. Your pain gets worse, or you have new pain. You have a high fever. You lose weight very quickly. You have trouble doing your normal activities. Get help right away if: One   or both of your legs or feet feel weak. One or both of your legs or feet lose feeling (have numbness). You have trouble controlling when you poop (have a bowel movement) or pee (urinate). You have bad back pain and: You feel like you may vomit (nauseous), or you vomit. You have pain in your belly (abdomen). You have shortness of breath. You faint. Summary When back pain lasts longer than 3 months, it is called chronic back pain. Pain may get worse at certain times  (flare-ups). Use ice and heat as told by your doctor. Your doctor may tell you to use ice after flare-ups. This information is not intended to replace advice given to you by your health care provider. Make sure you discuss any questions you have with your health care provider. Document Revised: 01/21/2020 Document Reviewed: 01/21/2020 Elsevier Patient Education  2022 [employer]  

## 2021-10-28 NOTE — Telephone Encounter (Signed)
na

## 2021-10-28 NOTE — Telephone Encounter (Signed)
I do not disagree that she could benefit from a wheelchair, that has to be ordered in a face-to-face visit.  If Je feels like she can order it off of her visit and that is fine, if not then patient will have to come back and be seen by me or do a face-to-face virtual visit to get the order.

## 2021-10-31 ENCOUNTER — Telehealth: Payer: Self-pay | Admitting: Family Medicine

## 2021-10-31 DIAGNOSIS — G4733 Obstructive sleep apnea (adult) (pediatric): Secondary | ICD-10-CM | POA: Diagnosis not present

## 2021-10-31 DIAGNOSIS — S42211D Unspecified displaced fracture of surgical neck of right humerus, subsequent encounter for fracture with routine healing: Secondary | ICD-10-CM | POA: Diagnosis not present

## 2021-10-31 DIAGNOSIS — Z993 Dependence on wheelchair: Secondary | ICD-10-CM | POA: Diagnosis not present

## 2021-10-31 DIAGNOSIS — M1991 Primary osteoarthritis, unspecified site: Secondary | ICD-10-CM | POA: Diagnosis not present

## 2021-10-31 DIAGNOSIS — J9601 Acute respiratory failure with hypoxia: Secondary | ICD-10-CM | POA: Diagnosis not present

## 2021-10-31 DIAGNOSIS — F32A Depression, unspecified: Secondary | ICD-10-CM | POA: Diagnosis not present

## 2021-10-31 DIAGNOSIS — Z87440 Personal history of urinary (tract) infections: Secondary | ICD-10-CM | POA: Diagnosis not present

## 2021-10-31 DIAGNOSIS — E559 Vitamin D deficiency, unspecified: Secondary | ICD-10-CM | POA: Diagnosis not present

## 2021-10-31 DIAGNOSIS — I10 Essential (primary) hypertension: Secondary | ICD-10-CM | POA: Diagnosis not present

## 2021-10-31 DIAGNOSIS — Z87891 Personal history of nicotine dependence: Secondary | ICD-10-CM | POA: Diagnosis not present

## 2021-10-31 DIAGNOSIS — S22088D Other fracture of T11-T12 vertebra, subsequent encounter for fracture with routine healing: Secondary | ICD-10-CM | POA: Diagnosis not present

## 2021-10-31 DIAGNOSIS — Z9181 History of falling: Secondary | ICD-10-CM | POA: Diagnosis not present

## 2021-10-31 DIAGNOSIS — D649 Anemia, unspecified: Secondary | ICD-10-CM | POA: Diagnosis not present

## 2021-10-31 DIAGNOSIS — S22078D Other fracture of T9-T10 vertebra, subsequent encounter for fracture with routine healing: Secondary | ICD-10-CM | POA: Diagnosis not present

## 2021-10-31 DIAGNOSIS — M549 Dorsalgia, unspecified: Secondary | ICD-10-CM | POA: Diagnosis not present

## 2021-10-31 DIAGNOSIS — E785 Hyperlipidemia, unspecified: Secondary | ICD-10-CM | POA: Diagnosis not present

## 2021-10-31 DIAGNOSIS — G8929 Other chronic pain: Secondary | ICD-10-CM | POA: Diagnosis not present

## 2021-10-31 DIAGNOSIS — E039 Hypothyroidism, unspecified: Secondary | ICD-10-CM | POA: Diagnosis not present

## 2021-10-31 DIAGNOSIS — J9602 Acute respiratory failure with hypercapnia: Secondary | ICD-10-CM | POA: Diagnosis not present

## 2021-10-31 DIAGNOSIS — S52501D Unspecified fracture of the lower end of right radius, subsequent encounter for closed fracture with routine healing: Secondary | ICD-10-CM | POA: Diagnosis not present

## 2021-10-31 DIAGNOSIS — N3281 Overactive bladder: Secondary | ICD-10-CM | POA: Diagnosis not present

## 2021-10-31 DIAGNOSIS — E538 Deficiency of other specified B group vitamins: Secondary | ICD-10-CM | POA: Diagnosis not present

## 2021-10-31 NOTE — Telephone Encounter (Signed)
Pt denies any chest pain, changes in vision, or HA. States that she is in pain from her back. Advised that she can try OTC pain medications and rotate heat/ice. Also, advised that getting up and walking could help as well as hydrating with water. Pt will also call back doctor to see if they have any suggestions. Pt will call back with update to see if BP has improved. States that she does take her medications consistently.

## 2021-10-31 NOTE — Telephone Encounter (Signed)
[place] called from [facility] to let provider know that pts BP is 180/86 and has been running high for a long time.   Also wanted to mention that pt had injection in her back this past Friday and says it did not help, per pt.   Please advise.

## 2021-11-02 DIAGNOSIS — D649 Anemia, unspecified: Secondary | ICD-10-CM | POA: Diagnosis not present

## 2021-11-02 DIAGNOSIS — G4733 Obstructive sleep apnea (adult) (pediatric): Secondary | ICD-10-CM | POA: Diagnosis not present

## 2021-11-02 DIAGNOSIS — N3281 Overactive bladder: Secondary | ICD-10-CM | POA: Diagnosis not present

## 2021-11-02 DIAGNOSIS — F32A Depression, unspecified: Secondary | ICD-10-CM | POA: Diagnosis not present

## 2021-11-02 DIAGNOSIS — S22078D Other fracture of T9-T10 vertebra, subsequent encounter for fracture with routine healing: Secondary | ICD-10-CM | POA: Diagnosis not present

## 2021-11-02 DIAGNOSIS — Z993 Dependence on wheelchair: Secondary | ICD-10-CM | POA: Diagnosis not present

## 2021-11-02 DIAGNOSIS — M549 Dorsalgia, unspecified: Secondary | ICD-10-CM | POA: Diagnosis not present

## 2021-11-02 DIAGNOSIS — M1991 Primary osteoarthritis, unspecified site: Secondary | ICD-10-CM | POA: Diagnosis not present

## 2021-11-02 DIAGNOSIS — S42211D Unspecified displaced fracture of surgical neck of right humerus, subsequent encounter for fracture with routine healing: Secondary | ICD-10-CM | POA: Diagnosis not present

## 2021-11-02 DIAGNOSIS — E559 Vitamin D deficiency, unspecified: Secondary | ICD-10-CM | POA: Diagnosis not present

## 2021-11-02 DIAGNOSIS — S22088D Other fracture of T11-T12 vertebra, subsequent encounter for fracture with routine healing: Secondary | ICD-10-CM | POA: Diagnosis not present

## 2021-11-02 DIAGNOSIS — Z87440 Personal history of urinary (tract) infections: Secondary | ICD-10-CM | POA: Diagnosis not present

## 2021-11-02 DIAGNOSIS — I10 Essential (primary) hypertension: Secondary | ICD-10-CM | POA: Diagnosis not present

## 2021-11-02 DIAGNOSIS — J9601 Acute respiratory failure with hypoxia: Secondary | ICD-10-CM | POA: Diagnosis not present

## 2021-11-02 DIAGNOSIS — Z9181 History of falling: Secondary | ICD-10-CM | POA: Diagnosis not present

## 2021-11-02 DIAGNOSIS — G8929 Other chronic pain: Secondary | ICD-10-CM | POA: Diagnosis not present

## 2021-11-02 DIAGNOSIS — J9602 Acute respiratory failure with hypercapnia: Secondary | ICD-10-CM | POA: Diagnosis not present

## 2021-11-02 DIAGNOSIS — E039 Hypothyroidism, unspecified: Secondary | ICD-10-CM | POA: Diagnosis not present

## 2021-11-02 DIAGNOSIS — E538 Deficiency of other specified B group vitamins: Secondary | ICD-10-CM | POA: Diagnosis not present

## 2021-11-02 DIAGNOSIS — Z87891 Personal history of nicotine dependence: Secondary | ICD-10-CM | POA: Diagnosis not present

## 2021-11-02 DIAGNOSIS — E785 Hyperlipidemia, unspecified: Secondary | ICD-10-CM | POA: Diagnosis not present

## 2021-11-02 DIAGNOSIS — S52501D Unspecified fracture of the lower end of right radius, subsequent encounter for closed fracture with routine healing: Secondary | ICD-10-CM | POA: Diagnosis not present

## 2021-11-07 DIAGNOSIS — E785 Hyperlipidemia, unspecified: Secondary | ICD-10-CM | POA: Diagnosis not present

## 2021-11-07 DIAGNOSIS — D649 Anemia, unspecified: Secondary | ICD-10-CM | POA: Diagnosis not present

## 2021-11-07 DIAGNOSIS — N3281 Overactive bladder: Secondary | ICD-10-CM | POA: Diagnosis not present

## 2021-11-07 DIAGNOSIS — F32A Depression, unspecified: Secondary | ICD-10-CM | POA: Diagnosis not present

## 2021-11-07 DIAGNOSIS — J9602 Acute respiratory failure with hypercapnia: Secondary | ICD-10-CM | POA: Diagnosis not present

## 2021-11-07 DIAGNOSIS — S22088D Other fracture of T11-T12 vertebra, subsequent encounter for fracture with routine healing: Secondary | ICD-10-CM | POA: Diagnosis not present

## 2021-11-07 DIAGNOSIS — Z9181 History of falling: Secondary | ICD-10-CM | POA: Diagnosis not present

## 2021-11-07 DIAGNOSIS — M1991 Primary osteoarthritis, unspecified site: Secondary | ICD-10-CM | POA: Diagnosis not present

## 2021-11-07 DIAGNOSIS — G8929 Other chronic pain: Secondary | ICD-10-CM | POA: Diagnosis not present

## 2021-11-07 DIAGNOSIS — M549 Dorsalgia, unspecified: Secondary | ICD-10-CM | POA: Diagnosis not present

## 2021-11-07 DIAGNOSIS — E039 Hypothyroidism, unspecified: Secondary | ICD-10-CM | POA: Diagnosis not present

## 2021-11-07 DIAGNOSIS — E559 Vitamin D deficiency, unspecified: Secondary | ICD-10-CM | POA: Diagnosis not present

## 2021-11-07 DIAGNOSIS — J9601 Acute respiratory failure with hypoxia: Secondary | ICD-10-CM | POA: Diagnosis not present

## 2021-11-07 DIAGNOSIS — Z993 Dependence on wheelchair: Secondary | ICD-10-CM | POA: Diagnosis not present

## 2021-11-07 DIAGNOSIS — S42211D Unspecified displaced fracture of surgical neck of right humerus, subsequent encounter for fracture with routine healing: Secondary | ICD-10-CM | POA: Diagnosis not present

## 2021-11-07 DIAGNOSIS — I10 Essential (primary) hypertension: Secondary | ICD-10-CM | POA: Diagnosis not present

## 2021-11-07 DIAGNOSIS — Z87891 Personal history of nicotine dependence: Secondary | ICD-10-CM | POA: Diagnosis not present

## 2021-11-07 DIAGNOSIS — Z87440 Personal history of urinary (tract) infections: Secondary | ICD-10-CM | POA: Diagnosis not present

## 2021-11-07 DIAGNOSIS — E538 Deficiency of other specified B group vitamins: Secondary | ICD-10-CM | POA: Diagnosis not present

## 2021-11-07 DIAGNOSIS — S22078D Other fracture of T9-T10 vertebra, subsequent encounter for fracture with routine healing: Secondary | ICD-10-CM | POA: Diagnosis not present

## 2021-11-07 DIAGNOSIS — S52501D Unspecified fracture of the lower end of right radius, subsequent encounter for closed fracture with routine healing: Secondary | ICD-10-CM | POA: Diagnosis not present

## 2021-11-07 DIAGNOSIS — G4733 Obstructive sleep apnea (adult) (pediatric): Secondary | ICD-10-CM | POA: Diagnosis not present

## 2021-11-08 ENCOUNTER — Telehealth: Payer: Self-pay | Admitting: Family Medicine

## 2021-11-08 DIAGNOSIS — E785 Hyperlipidemia, unspecified: Secondary | ICD-10-CM | POA: Diagnosis not present

## 2021-11-08 DIAGNOSIS — G8929 Other chronic pain: Secondary | ICD-10-CM | POA: Diagnosis not present

## 2021-11-08 DIAGNOSIS — S22088D Other fracture of T11-T12 vertebra, subsequent encounter for fracture with routine healing: Secondary | ICD-10-CM | POA: Diagnosis not present

## 2021-11-08 DIAGNOSIS — J9602 Acute respiratory failure with hypercapnia: Secondary | ICD-10-CM | POA: Diagnosis not present

## 2021-11-08 DIAGNOSIS — E559 Vitamin D deficiency, unspecified: Secondary | ICD-10-CM | POA: Diagnosis not present

## 2021-11-08 DIAGNOSIS — G4733 Obstructive sleep apnea (adult) (pediatric): Secondary | ICD-10-CM | POA: Diagnosis not present

## 2021-11-08 DIAGNOSIS — Z993 Dependence on wheelchair: Secondary | ICD-10-CM | POA: Diagnosis not present

## 2021-11-08 DIAGNOSIS — D649 Anemia, unspecified: Secondary | ICD-10-CM | POA: Diagnosis not present

## 2021-11-08 DIAGNOSIS — Z9181 History of falling: Secondary | ICD-10-CM | POA: Diagnosis not present

## 2021-11-08 DIAGNOSIS — S52501D Unspecified fracture of the lower end of right radius, subsequent encounter for closed fracture with routine healing: Secondary | ICD-10-CM | POA: Diagnosis not present

## 2021-11-08 DIAGNOSIS — S22078D Other fracture of T9-T10 vertebra, subsequent encounter for fracture with routine healing: Secondary | ICD-10-CM | POA: Diagnosis not present

## 2021-11-08 DIAGNOSIS — Z87440 Personal history of urinary (tract) infections: Secondary | ICD-10-CM | POA: Diagnosis not present

## 2021-11-08 DIAGNOSIS — E039 Hypothyroidism, unspecified: Secondary | ICD-10-CM | POA: Diagnosis not present

## 2021-11-08 DIAGNOSIS — Z87891 Personal history of nicotine dependence: Secondary | ICD-10-CM | POA: Diagnosis not present

## 2021-11-08 DIAGNOSIS — S42211D Unspecified displaced fracture of surgical neck of right humerus, subsequent encounter for fracture with routine healing: Secondary | ICD-10-CM | POA: Diagnosis not present

## 2021-11-08 DIAGNOSIS — M549 Dorsalgia, unspecified: Secondary | ICD-10-CM | POA: Diagnosis not present

## 2021-11-08 DIAGNOSIS — N3281 Overactive bladder: Secondary | ICD-10-CM | POA: Diagnosis not present

## 2021-11-08 DIAGNOSIS — I10 Essential (primary) hypertension: Secondary | ICD-10-CM | POA: Diagnosis not present

## 2021-11-08 DIAGNOSIS — J9601 Acute respiratory failure with hypoxia: Secondary | ICD-10-CM | POA: Diagnosis not present

## 2021-11-08 DIAGNOSIS — M1991 Primary osteoarthritis, unspecified site: Secondary | ICD-10-CM | POA: Diagnosis not present

## 2021-11-08 DIAGNOSIS — F32A Depression, unspecified: Secondary | ICD-10-CM | POA: Diagnosis not present

## 2021-11-08 DIAGNOSIS — E538 Deficiency of other specified B group vitamins: Secondary | ICD-10-CM | POA: Diagnosis not present

## 2021-11-09 NOTE — Telephone Encounter (Signed)
Pt aware order to [facility]

## 2021-11-10 DIAGNOSIS — E785 Hyperlipidemia, unspecified: Secondary | ICD-10-CM | POA: Diagnosis not present

## 2021-11-10 DIAGNOSIS — Z87440 Personal history of urinary (tract) infections: Secondary | ICD-10-CM | POA: Diagnosis not present

## 2021-11-10 DIAGNOSIS — Z9181 History of falling: Secondary | ICD-10-CM | POA: Diagnosis not present

## 2021-11-10 DIAGNOSIS — E559 Vitamin D deficiency, unspecified: Secondary | ICD-10-CM | POA: Diagnosis not present

## 2021-11-10 DIAGNOSIS — Z993 Dependence on wheelchair: Secondary | ICD-10-CM | POA: Diagnosis not present

## 2021-11-10 DIAGNOSIS — G4733 Obstructive sleep apnea (adult) (pediatric): Secondary | ICD-10-CM | POA: Diagnosis not present

## 2021-11-10 DIAGNOSIS — M549 Dorsalgia, unspecified: Secondary | ICD-10-CM | POA: Diagnosis not present

## 2021-11-10 DIAGNOSIS — I10 Essential (primary) hypertension: Secondary | ICD-10-CM | POA: Diagnosis not present

## 2021-11-10 DIAGNOSIS — S52501D Unspecified fracture of the lower end of right radius, subsequent encounter for closed fracture with routine healing: Secondary | ICD-10-CM | POA: Diagnosis not present

## 2021-11-10 DIAGNOSIS — F32A Depression, unspecified: Secondary | ICD-10-CM | POA: Diagnosis not present

## 2021-11-10 DIAGNOSIS — N3281 Overactive bladder: Secondary | ICD-10-CM | POA: Diagnosis not present

## 2021-11-10 DIAGNOSIS — D649 Anemia, unspecified: Secondary | ICD-10-CM | POA: Diagnosis not present

## 2021-11-10 DIAGNOSIS — S22078D Other fracture of T9-T10 vertebra, subsequent encounter for fracture with routine healing: Secondary | ICD-10-CM | POA: Diagnosis not present

## 2021-11-10 DIAGNOSIS — J9602 Acute respiratory failure with hypercapnia: Secondary | ICD-10-CM | POA: Diagnosis not present

## 2021-11-10 DIAGNOSIS — S22088D Other fracture of T11-T12 vertebra, subsequent encounter for fracture with routine healing: Secondary | ICD-10-CM | POA: Diagnosis not present

## 2021-11-10 DIAGNOSIS — Z87891 Personal history of nicotine dependence: Secondary | ICD-10-CM | POA: Diagnosis not present

## 2021-11-10 DIAGNOSIS — E538 Deficiency of other specified B group vitamins: Secondary | ICD-10-CM | POA: Diagnosis not present

## 2021-11-10 DIAGNOSIS — M1991 Primary osteoarthritis, unspecified site: Secondary | ICD-10-CM | POA: Diagnosis not present

## 2021-11-10 DIAGNOSIS — G8929 Other chronic pain: Secondary | ICD-10-CM | POA: Diagnosis not present

## 2021-11-10 DIAGNOSIS — E039 Hypothyroidism, unspecified: Secondary | ICD-10-CM | POA: Diagnosis not present

## 2021-11-10 DIAGNOSIS — S42211D Unspecified displaced fracture of surgical neck of right humerus, subsequent encounter for fracture with routine healing: Secondary | ICD-10-CM | POA: Diagnosis not present

## 2021-11-10 DIAGNOSIS — J9601 Acute respiratory failure with hypoxia: Secondary | ICD-10-CM | POA: Diagnosis not present

## 2021-11-11 DIAGNOSIS — Z87440 Personal history of urinary (tract) infections: Secondary | ICD-10-CM | POA: Diagnosis not present

## 2021-11-11 DIAGNOSIS — M549 Dorsalgia, unspecified: Secondary | ICD-10-CM | POA: Diagnosis not present

## 2021-11-11 DIAGNOSIS — E039 Hypothyroidism, unspecified: Secondary | ICD-10-CM | POA: Diagnosis not present

## 2021-11-11 DIAGNOSIS — N3281 Overactive bladder: Secondary | ICD-10-CM | POA: Diagnosis not present

## 2021-11-11 DIAGNOSIS — S52501D Unspecified fracture of the lower end of right radius, subsequent encounter for closed fracture with routine healing: Secondary | ICD-10-CM | POA: Diagnosis not present

## 2021-11-11 DIAGNOSIS — G4733 Obstructive sleep apnea (adult) (pediatric): Secondary | ICD-10-CM | POA: Diagnosis not present

## 2021-11-11 DIAGNOSIS — M1991 Primary osteoarthritis, unspecified site: Secondary | ICD-10-CM | POA: Diagnosis not present

## 2021-11-11 DIAGNOSIS — S22078D Other fracture of T9-T10 vertebra, subsequent encounter for fracture with routine healing: Secondary | ICD-10-CM | POA: Diagnosis not present

## 2021-11-11 DIAGNOSIS — S22088D Other fracture of T11-T12 vertebra, subsequent encounter for fracture with routine healing: Secondary | ICD-10-CM | POA: Diagnosis not present

## 2021-11-11 DIAGNOSIS — J9601 Acute respiratory failure with hypoxia: Secondary | ICD-10-CM | POA: Diagnosis not present

## 2021-11-11 DIAGNOSIS — Z87891 Personal history of nicotine dependence: Secondary | ICD-10-CM | POA: Diagnosis not present

## 2021-11-11 DIAGNOSIS — I10 Essential (primary) hypertension: Secondary | ICD-10-CM | POA: Diagnosis not present

## 2021-11-11 DIAGNOSIS — D649 Anemia, unspecified: Secondary | ICD-10-CM | POA: Diagnosis not present

## 2021-11-11 DIAGNOSIS — E538 Deficiency of other specified B group vitamins: Secondary | ICD-10-CM | POA: Diagnosis not present

## 2021-11-11 DIAGNOSIS — S42211D Unspecified displaced fracture of surgical neck of right humerus, subsequent encounter for fracture with routine healing: Secondary | ICD-10-CM | POA: Diagnosis not present

## 2021-11-11 DIAGNOSIS — E785 Hyperlipidemia, unspecified: Secondary | ICD-10-CM | POA: Diagnosis not present

## 2021-11-11 DIAGNOSIS — Z9181 History of falling: Secondary | ICD-10-CM | POA: Diagnosis not present

## 2021-11-11 DIAGNOSIS — E559 Vitamin D deficiency, unspecified: Secondary | ICD-10-CM | POA: Diagnosis not present

## 2021-11-11 DIAGNOSIS — G8929 Other chronic pain: Secondary | ICD-10-CM | POA: Diagnosis not present

## 2021-11-11 DIAGNOSIS — J9602 Acute respiratory failure with hypercapnia: Secondary | ICD-10-CM | POA: Diagnosis not present

## 2021-11-11 DIAGNOSIS — Z993 Dependence on wheelchair: Secondary | ICD-10-CM | POA: Diagnosis not present

## 2021-11-11 DIAGNOSIS — F32A Depression, unspecified: Secondary | ICD-10-CM | POA: Diagnosis not present

## 2021-11-14 ENCOUNTER — Telehealth: Payer: Self-pay | Admitting: Family Medicine

## 2021-11-14 NOTE — Telephone Encounter (Signed)
Tried calling pt. No answer and no vmail.  If pt is wanting a wheelchair, she will need to schedule a face to face appt. Can be virtual if needed.

## 2021-11-15 DIAGNOSIS — E559 Vitamin D deficiency, unspecified: Secondary | ICD-10-CM | POA: Diagnosis not present

## 2021-11-15 DIAGNOSIS — M549 Dorsalgia, unspecified: Secondary | ICD-10-CM | POA: Diagnosis not present

## 2021-11-15 DIAGNOSIS — F32A Depression, unspecified: Secondary | ICD-10-CM | POA: Diagnosis not present

## 2021-11-15 DIAGNOSIS — N3281 Overactive bladder: Secondary | ICD-10-CM | POA: Diagnosis not present

## 2021-11-15 DIAGNOSIS — E538 Deficiency of other specified B group vitamins: Secondary | ICD-10-CM | POA: Diagnosis not present

## 2021-11-15 DIAGNOSIS — E039 Hypothyroidism, unspecified: Secondary | ICD-10-CM | POA: Diagnosis not present

## 2021-11-15 DIAGNOSIS — J9602 Acute respiratory failure with hypercapnia: Secondary | ICD-10-CM | POA: Diagnosis not present

## 2021-11-15 DIAGNOSIS — Z87891 Personal history of nicotine dependence: Secondary | ICD-10-CM | POA: Diagnosis not present

## 2021-11-15 DIAGNOSIS — Z87440 Personal history of urinary (tract) infections: Secondary | ICD-10-CM | POA: Diagnosis not present

## 2021-11-15 DIAGNOSIS — J9601 Acute respiratory failure with hypoxia: Secondary | ICD-10-CM | POA: Diagnosis not present

## 2021-11-15 DIAGNOSIS — G8929 Other chronic pain: Secondary | ICD-10-CM | POA: Diagnosis not present

## 2021-11-15 DIAGNOSIS — S22078D Other fracture of T9-T10 vertebra, subsequent encounter for fracture with routine healing: Secondary | ICD-10-CM | POA: Diagnosis not present

## 2021-11-15 DIAGNOSIS — S52501D Unspecified fracture of the lower end of right radius, subsequent encounter for closed fracture with routine healing: Secondary | ICD-10-CM | POA: Diagnosis not present

## 2021-11-15 DIAGNOSIS — S22088D Other fracture of T11-T12 vertebra, subsequent encounter for fracture with routine healing: Secondary | ICD-10-CM | POA: Diagnosis not present

## 2021-11-15 DIAGNOSIS — I10 Essential (primary) hypertension: Secondary | ICD-10-CM | POA: Diagnosis not present

## 2021-11-15 DIAGNOSIS — D649 Anemia, unspecified: Secondary | ICD-10-CM | POA: Diagnosis not present

## 2021-11-15 DIAGNOSIS — M1991 Primary osteoarthritis, unspecified site: Secondary | ICD-10-CM | POA: Diagnosis not present

## 2021-11-15 DIAGNOSIS — G4733 Obstructive sleep apnea (adult) (pediatric): Secondary | ICD-10-CM | POA: Diagnosis not present

## 2021-11-15 DIAGNOSIS — E785 Hyperlipidemia, unspecified: Secondary | ICD-10-CM | POA: Diagnosis not present

## 2021-11-15 DIAGNOSIS — Z993 Dependence on wheelchair: Secondary | ICD-10-CM | POA: Diagnosis not present

## 2021-11-15 DIAGNOSIS — S42211D Unspecified displaced fracture of surgical neck of right humerus, subsequent encounter for fracture with routine healing: Secondary | ICD-10-CM | POA: Diagnosis not present

## 2021-11-15 DIAGNOSIS — Z9181 History of falling: Secondary | ICD-10-CM | POA: Diagnosis not present

## 2021-11-16 NOTE — Telephone Encounter (Signed)
Has appt 11/28 

## 2021-11-17 DIAGNOSIS — R0902 Hypoxemia: Secondary | ICD-10-CM | POA: Diagnosis not present

## 2021-11-18 DIAGNOSIS — M549 Dorsalgia, unspecified: Secondary | ICD-10-CM | POA: Diagnosis not present

## 2021-11-18 DIAGNOSIS — Z87440 Personal history of urinary (tract) infections: Secondary | ICD-10-CM | POA: Diagnosis not present

## 2021-11-18 DIAGNOSIS — E559 Vitamin D deficiency, unspecified: Secondary | ICD-10-CM | POA: Diagnosis not present

## 2021-11-18 DIAGNOSIS — J9602 Acute respiratory failure with hypercapnia: Secondary | ICD-10-CM | POA: Diagnosis not present

## 2021-11-18 DIAGNOSIS — E039 Hypothyroidism, unspecified: Secondary | ICD-10-CM | POA: Diagnosis not present

## 2021-11-18 DIAGNOSIS — J9601 Acute respiratory failure with hypoxia: Secondary | ICD-10-CM | POA: Diagnosis not present

## 2021-11-18 DIAGNOSIS — S22078D Other fracture of T9-T10 vertebra, subsequent encounter for fracture with routine healing: Secondary | ICD-10-CM | POA: Diagnosis not present

## 2021-11-18 DIAGNOSIS — E538 Deficiency of other specified B group vitamins: Secondary | ICD-10-CM | POA: Diagnosis not present

## 2021-11-18 DIAGNOSIS — I10 Essential (primary) hypertension: Secondary | ICD-10-CM | POA: Diagnosis not present

## 2021-11-18 DIAGNOSIS — D649 Anemia, unspecified: Secondary | ICD-10-CM | POA: Diagnosis not present

## 2021-11-18 DIAGNOSIS — Z993 Dependence on wheelchair: Secondary | ICD-10-CM | POA: Diagnosis not present

## 2021-11-18 DIAGNOSIS — M1991 Primary osteoarthritis, unspecified site: Secondary | ICD-10-CM | POA: Diagnosis not present

## 2021-11-18 DIAGNOSIS — S22088D Other fracture of T11-T12 vertebra, subsequent encounter for fracture with routine healing: Secondary | ICD-10-CM | POA: Diagnosis not present

## 2021-11-18 DIAGNOSIS — S52501D Unspecified fracture of the lower end of right radius, subsequent encounter for closed fracture with routine healing: Secondary | ICD-10-CM | POA: Diagnosis not present

## 2021-11-18 DIAGNOSIS — G4733 Obstructive sleep apnea (adult) (pediatric): Secondary | ICD-10-CM | POA: Diagnosis not present

## 2021-11-18 DIAGNOSIS — E785 Hyperlipidemia, unspecified: Secondary | ICD-10-CM | POA: Diagnosis not present

## 2021-11-18 DIAGNOSIS — S42211D Unspecified displaced fracture of surgical neck of right humerus, subsequent encounter for fracture with routine healing: Secondary | ICD-10-CM | POA: Diagnosis not present

## 2021-11-18 DIAGNOSIS — N3281 Overactive bladder: Secondary | ICD-10-CM | POA: Diagnosis not present

## 2021-11-18 DIAGNOSIS — G8929 Other chronic pain: Secondary | ICD-10-CM | POA: Diagnosis not present

## 2021-11-18 DIAGNOSIS — Z87891 Personal history of nicotine dependence: Secondary | ICD-10-CM | POA: Diagnosis not present

## 2021-11-18 DIAGNOSIS — Z9181 History of falling: Secondary | ICD-10-CM | POA: Diagnosis not present

## 2021-11-18 DIAGNOSIS — F32A Depression, unspecified: Secondary | ICD-10-CM | POA: Diagnosis not present

## 2021-11-21 ENCOUNTER — Telehealth: Payer: Self-pay | Admitting: Family Medicine

## 2021-11-21 ENCOUNTER — Ambulatory Visit: Payer: Medicare Other | Admitting: Family Medicine

## 2021-11-21 DIAGNOSIS — G4733 Obstructive sleep apnea (adult) (pediatric): Secondary | ICD-10-CM | POA: Diagnosis not present

## 2021-11-21 DIAGNOSIS — Z9181 History of falling: Secondary | ICD-10-CM | POA: Diagnosis not present

## 2021-11-21 DIAGNOSIS — S52501D Unspecified fracture of the lower end of right radius, subsequent encounter for closed fracture with routine healing: Secondary | ICD-10-CM | POA: Diagnosis not present

## 2021-11-21 DIAGNOSIS — M1991 Primary osteoarthritis, unspecified site: Secondary | ICD-10-CM | POA: Diagnosis not present

## 2021-11-21 DIAGNOSIS — E559 Vitamin D deficiency, unspecified: Secondary | ICD-10-CM | POA: Diagnosis not present

## 2021-11-21 DIAGNOSIS — D649 Anemia, unspecified: Secondary | ICD-10-CM | POA: Diagnosis not present

## 2021-11-21 DIAGNOSIS — I10 Essential (primary) hypertension: Secondary | ICD-10-CM | POA: Diagnosis not present

## 2021-11-21 DIAGNOSIS — G8929 Other chronic pain: Secondary | ICD-10-CM | POA: Diagnosis not present

## 2021-11-21 DIAGNOSIS — Z993 Dependence on wheelchair: Secondary | ICD-10-CM | POA: Diagnosis not present

## 2021-11-21 DIAGNOSIS — Z87440 Personal history of urinary (tract) infections: Secondary | ICD-10-CM | POA: Diagnosis not present

## 2021-11-21 DIAGNOSIS — N3281 Overactive bladder: Secondary | ICD-10-CM | POA: Diagnosis not present

## 2021-11-21 DIAGNOSIS — F32A Depression, unspecified: Secondary | ICD-10-CM | POA: Diagnosis not present

## 2021-11-21 DIAGNOSIS — Z87891 Personal history of nicotine dependence: Secondary | ICD-10-CM | POA: Diagnosis not present

## 2021-11-21 DIAGNOSIS — E785 Hyperlipidemia, unspecified: Secondary | ICD-10-CM | POA: Diagnosis not present

## 2021-11-21 DIAGNOSIS — J9601 Acute respiratory failure with hypoxia: Secondary | ICD-10-CM | POA: Diagnosis not present

## 2021-11-21 DIAGNOSIS — J9602 Acute respiratory failure with hypercapnia: Secondary | ICD-10-CM | POA: Diagnosis not present

## 2021-11-21 DIAGNOSIS — S42211D Unspecified displaced fracture of surgical neck of right humerus, subsequent encounter for fracture with routine healing: Secondary | ICD-10-CM | POA: Diagnosis not present

## 2021-11-21 DIAGNOSIS — S22078D Other fracture of T9-T10 vertebra, subsequent encounter for fracture with routine healing: Secondary | ICD-10-CM | POA: Diagnosis not present

## 2021-11-21 DIAGNOSIS — S22088D Other fracture of T11-T12 vertebra, subsequent encounter for fracture with routine healing: Secondary | ICD-10-CM | POA: Diagnosis not present

## 2021-11-21 DIAGNOSIS — E538 Deficiency of other specified B group vitamins: Secondary | ICD-10-CM | POA: Diagnosis not present

## 2021-11-21 DIAGNOSIS — M549 Dorsalgia, unspecified: Secondary | ICD-10-CM | POA: Diagnosis not present

## 2021-11-21 DIAGNOSIS — E039 Hypothyroidism, unspecified: Secondary | ICD-10-CM | POA: Diagnosis not present

## 2021-11-21 NOTE — Telephone Encounter (Signed)
Advised pt that she would need a virtual visit or an in office visit for a wheelchair Rx. Pt states that she has a lot of appts right now and does not want to schedule. Informed pt that when she calls back she may not be able to get an appt within a couple of weeks. Pt understood and will call back when ready.

## 2021-11-22 DIAGNOSIS — I10 Essential (primary) hypertension: Secondary | ICD-10-CM | POA: Diagnosis not present

## 2021-11-22 DIAGNOSIS — S42211D Unspecified displaced fracture of surgical neck of right humerus, subsequent encounter for fracture with routine healing: Secondary | ICD-10-CM | POA: Diagnosis not present

## 2021-11-22 DIAGNOSIS — Z993 Dependence on wheelchair: Secondary | ICD-10-CM | POA: Diagnosis not present

## 2021-11-22 DIAGNOSIS — N3281 Overactive bladder: Secondary | ICD-10-CM | POA: Diagnosis not present

## 2021-11-22 DIAGNOSIS — E785 Hyperlipidemia, unspecified: Secondary | ICD-10-CM | POA: Diagnosis not present

## 2021-11-22 DIAGNOSIS — M25511 Pain in right shoulder: Secondary | ICD-10-CM | POA: Diagnosis not present

## 2021-11-22 DIAGNOSIS — D649 Anemia, unspecified: Secondary | ICD-10-CM | POA: Diagnosis not present

## 2021-11-22 DIAGNOSIS — E538 Deficiency of other specified B group vitamins: Secondary | ICD-10-CM | POA: Diagnosis not present

## 2021-11-22 DIAGNOSIS — J9602 Acute respiratory failure with hypercapnia: Secondary | ICD-10-CM | POA: Diagnosis not present

## 2021-11-22 DIAGNOSIS — M25531 Pain in right wrist: Secondary | ICD-10-CM | POA: Diagnosis not present

## 2021-11-22 DIAGNOSIS — S52501D Unspecified fracture of the lower end of right radius, subsequent encounter for closed fracture with routine healing: Secondary | ICD-10-CM | POA: Diagnosis not present

## 2021-11-22 DIAGNOSIS — S22088D Other fracture of T11-T12 vertebra, subsequent encounter for fracture with routine healing: Secondary | ICD-10-CM | POA: Diagnosis not present

## 2021-11-22 DIAGNOSIS — M549 Dorsalgia, unspecified: Secondary | ICD-10-CM | POA: Diagnosis not present

## 2021-11-22 DIAGNOSIS — G4733 Obstructive sleep apnea (adult) (pediatric): Secondary | ICD-10-CM | POA: Diagnosis not present

## 2021-11-22 DIAGNOSIS — J9601 Acute respiratory failure with hypoxia: Secondary | ICD-10-CM | POA: Diagnosis not present

## 2021-11-22 DIAGNOSIS — F32A Depression, unspecified: Secondary | ICD-10-CM | POA: Diagnosis not present

## 2021-11-22 DIAGNOSIS — S22078D Other fracture of T9-T10 vertebra, subsequent encounter for fracture with routine healing: Secondary | ICD-10-CM | POA: Diagnosis not present

## 2021-11-22 DIAGNOSIS — Z87440 Personal history of urinary (tract) infections: Secondary | ICD-10-CM | POA: Diagnosis not present

## 2021-11-22 DIAGNOSIS — E039 Hypothyroidism, unspecified: Secondary | ICD-10-CM | POA: Diagnosis not present

## 2021-11-22 DIAGNOSIS — M1991 Primary osteoarthritis, unspecified site: Secondary | ICD-10-CM | POA: Diagnosis not present

## 2021-11-22 DIAGNOSIS — E559 Vitamin D deficiency, unspecified: Secondary | ICD-10-CM | POA: Diagnosis not present

## 2021-11-22 DIAGNOSIS — Z9181 History of falling: Secondary | ICD-10-CM | POA: Diagnosis not present

## 2021-11-22 DIAGNOSIS — Z87891 Personal history of nicotine dependence: Secondary | ICD-10-CM | POA: Diagnosis not present

## 2021-11-22 DIAGNOSIS — G8929 Other chronic pain: Secondary | ICD-10-CM | POA: Diagnosis not present

## 2021-11-23 DIAGNOSIS — J9602 Acute respiratory failure with hypercapnia: Secondary | ICD-10-CM | POA: Diagnosis not present

## 2021-11-23 DIAGNOSIS — M1991 Primary osteoarthritis, unspecified site: Secondary | ICD-10-CM | POA: Diagnosis not present

## 2021-11-23 DIAGNOSIS — Z87891 Personal history of nicotine dependence: Secondary | ICD-10-CM | POA: Diagnosis not present

## 2021-11-23 DIAGNOSIS — S22078D Other fracture of T9-T10 vertebra, subsequent encounter for fracture with routine healing: Secondary | ICD-10-CM | POA: Diagnosis not present

## 2021-11-23 DIAGNOSIS — E785 Hyperlipidemia, unspecified: Secondary | ICD-10-CM | POA: Diagnosis not present

## 2021-11-23 DIAGNOSIS — E559 Vitamin D deficiency, unspecified: Secondary | ICD-10-CM | POA: Diagnosis not present

## 2021-11-23 DIAGNOSIS — M549 Dorsalgia, unspecified: Secondary | ICD-10-CM | POA: Diagnosis not present

## 2021-11-23 DIAGNOSIS — I10 Essential (primary) hypertension: Secondary | ICD-10-CM | POA: Diagnosis not present

## 2021-11-23 DIAGNOSIS — F32A Depression, unspecified: Secondary | ICD-10-CM | POA: Diagnosis not present

## 2021-11-23 DIAGNOSIS — Z993 Dependence on wheelchair: Secondary | ICD-10-CM | POA: Diagnosis not present

## 2021-11-23 DIAGNOSIS — Z9181 History of falling: Secondary | ICD-10-CM | POA: Diagnosis not present

## 2021-11-23 DIAGNOSIS — N3281 Overactive bladder: Secondary | ICD-10-CM | POA: Diagnosis not present

## 2021-11-23 DIAGNOSIS — E538 Deficiency of other specified B group vitamins: Secondary | ICD-10-CM | POA: Diagnosis not present

## 2021-11-23 DIAGNOSIS — S42211D Unspecified displaced fracture of surgical neck of right humerus, subsequent encounter for fracture with routine healing: Secondary | ICD-10-CM | POA: Diagnosis not present

## 2021-11-23 DIAGNOSIS — E039 Hypothyroidism, unspecified: Secondary | ICD-10-CM | POA: Diagnosis not present

## 2021-11-23 DIAGNOSIS — D649 Anemia, unspecified: Secondary | ICD-10-CM | POA: Diagnosis not present

## 2021-11-23 DIAGNOSIS — Z87440 Personal history of urinary (tract) infections: Secondary | ICD-10-CM | POA: Diagnosis not present

## 2021-11-23 DIAGNOSIS — J9601 Acute respiratory failure with hypoxia: Secondary | ICD-10-CM | POA: Diagnosis not present

## 2021-11-23 DIAGNOSIS — G8929 Other chronic pain: Secondary | ICD-10-CM | POA: Diagnosis not present

## 2021-11-23 DIAGNOSIS — S22088D Other fracture of T11-T12 vertebra, subsequent encounter for fracture with routine healing: Secondary | ICD-10-CM | POA: Diagnosis not present

## 2021-11-23 DIAGNOSIS — S52501D Unspecified fracture of the lower end of right radius, subsequent encounter for closed fracture with routine healing: Secondary | ICD-10-CM | POA: Diagnosis not present

## 2021-11-23 DIAGNOSIS — G4733 Obstructive sleep apnea (adult) (pediatric): Secondary | ICD-10-CM | POA: Diagnosis not present

## 2021-11-24 DIAGNOSIS — S22078D Other fracture of T9-T10 vertebra, subsequent encounter for fracture with routine healing: Secondary | ICD-10-CM | POA: Diagnosis not present

## 2021-11-24 DIAGNOSIS — E039 Hypothyroidism, unspecified: Secondary | ICD-10-CM | POA: Diagnosis not present

## 2021-11-24 DIAGNOSIS — D649 Anemia, unspecified: Secondary | ICD-10-CM | POA: Diagnosis not present

## 2021-11-24 DIAGNOSIS — S52501D Unspecified fracture of the lower end of right radius, subsequent encounter for closed fracture with routine healing: Secondary | ICD-10-CM | POA: Diagnosis not present

## 2021-11-24 DIAGNOSIS — I10 Essential (primary) hypertension: Secondary | ICD-10-CM | POA: Diagnosis not present

## 2021-11-24 DIAGNOSIS — Z993 Dependence on wheelchair: Secondary | ICD-10-CM | POA: Diagnosis not present

## 2021-11-24 DIAGNOSIS — M1991 Primary osteoarthritis, unspecified site: Secondary | ICD-10-CM | POA: Diagnosis not present

## 2021-11-24 DIAGNOSIS — E785 Hyperlipidemia, unspecified: Secondary | ICD-10-CM | POA: Diagnosis not present

## 2021-11-24 DIAGNOSIS — N3281 Overactive bladder: Secondary | ICD-10-CM | POA: Diagnosis not present

## 2021-11-24 DIAGNOSIS — J9612 Chronic respiratory failure with hypercapnia: Secondary | ICD-10-CM | POA: Diagnosis not present

## 2021-11-24 DIAGNOSIS — E559 Vitamin D deficiency, unspecified: Secondary | ICD-10-CM | POA: Diagnosis not present

## 2021-11-24 DIAGNOSIS — Z87440 Personal history of urinary (tract) infections: Secondary | ICD-10-CM | POA: Diagnosis not present

## 2021-11-24 DIAGNOSIS — Z87891 Personal history of nicotine dependence: Secondary | ICD-10-CM | POA: Diagnosis not present

## 2021-11-24 DIAGNOSIS — M549 Dorsalgia, unspecified: Secondary | ICD-10-CM | POA: Diagnosis not present

## 2021-11-24 DIAGNOSIS — S42211D Unspecified displaced fracture of surgical neck of right humerus, subsequent encounter for fracture with routine healing: Secondary | ICD-10-CM | POA: Diagnosis not present

## 2021-11-24 DIAGNOSIS — Z9181 History of falling: Secondary | ICD-10-CM | POA: Diagnosis not present

## 2021-11-24 DIAGNOSIS — J9611 Chronic respiratory failure with hypoxia: Secondary | ICD-10-CM | POA: Diagnosis not present

## 2021-11-24 DIAGNOSIS — E538 Deficiency of other specified B group vitamins: Secondary | ICD-10-CM | POA: Diagnosis not present

## 2021-11-24 DIAGNOSIS — F32A Depression, unspecified: Secondary | ICD-10-CM | POA: Diagnosis not present

## 2021-11-24 DIAGNOSIS — G4733 Obstructive sleep apnea (adult) (pediatric): Secondary | ICD-10-CM | POA: Diagnosis not present

## 2021-11-24 DIAGNOSIS — S22088D Other fracture of T11-T12 vertebra, subsequent encounter for fracture with routine healing: Secondary | ICD-10-CM | POA: Diagnosis not present

## 2021-11-24 DIAGNOSIS — G8929 Other chronic pain: Secondary | ICD-10-CM | POA: Diagnosis not present

## 2021-11-25 ENCOUNTER — Other Ambulatory Visit: Payer: Self-pay | Admitting: Family Medicine

## 2021-11-25 DIAGNOSIS — M545 Low back pain, unspecified: Secondary | ICD-10-CM | POA: Diagnosis not present

## 2021-11-25 DIAGNOSIS — E1169 Type 2 diabetes mellitus with other specified complication: Secondary | ICD-10-CM

## 2021-11-25 DIAGNOSIS — G8929 Other chronic pain: Secondary | ICD-10-CM

## 2021-11-28 DIAGNOSIS — S52501D Unspecified fracture of the lower end of right radius, subsequent encounter for closed fracture with routine healing: Secondary | ICD-10-CM | POA: Diagnosis not present

## 2021-11-28 DIAGNOSIS — E785 Hyperlipidemia, unspecified: Secondary | ICD-10-CM | POA: Diagnosis not present

## 2021-11-28 DIAGNOSIS — I10 Essential (primary) hypertension: Secondary | ICD-10-CM | POA: Diagnosis not present

## 2021-11-28 DIAGNOSIS — Z87440 Personal history of urinary (tract) infections: Secondary | ICD-10-CM | POA: Diagnosis not present

## 2021-11-28 DIAGNOSIS — J9612 Chronic respiratory failure with hypercapnia: Secondary | ICD-10-CM | POA: Diagnosis not present

## 2021-11-28 DIAGNOSIS — D649 Anemia, unspecified: Secondary | ICD-10-CM | POA: Diagnosis not present

## 2021-11-28 DIAGNOSIS — Z9181 History of falling: Secondary | ICD-10-CM | POA: Diagnosis not present

## 2021-11-28 DIAGNOSIS — E559 Vitamin D deficiency, unspecified: Secondary | ICD-10-CM | POA: Diagnosis not present

## 2021-11-28 DIAGNOSIS — E538 Deficiency of other specified B group vitamins: Secondary | ICD-10-CM | POA: Diagnosis not present

## 2021-11-28 DIAGNOSIS — S22088D Other fracture of T11-T12 vertebra, subsequent encounter for fracture with routine healing: Secondary | ICD-10-CM | POA: Diagnosis not present

## 2021-11-28 DIAGNOSIS — E039 Hypothyroidism, unspecified: Secondary | ICD-10-CM | POA: Diagnosis not present

## 2021-11-28 DIAGNOSIS — S22078D Other fracture of T9-T10 vertebra, subsequent encounter for fracture with routine healing: Secondary | ICD-10-CM | POA: Diagnosis not present

## 2021-11-28 DIAGNOSIS — Z993 Dependence on wheelchair: Secondary | ICD-10-CM | POA: Diagnosis not present

## 2021-11-28 DIAGNOSIS — M1991 Primary osteoarthritis, unspecified site: Secondary | ICD-10-CM | POA: Diagnosis not present

## 2021-11-28 DIAGNOSIS — G4733 Obstructive sleep apnea (adult) (pediatric): Secondary | ICD-10-CM | POA: Diagnosis not present

## 2021-11-28 DIAGNOSIS — M549 Dorsalgia, unspecified: Secondary | ICD-10-CM | POA: Diagnosis not present

## 2021-11-28 DIAGNOSIS — J9611 Chronic respiratory failure with hypoxia: Secondary | ICD-10-CM | POA: Diagnosis not present

## 2021-11-28 DIAGNOSIS — S42211D Unspecified displaced fracture of surgical neck of right humerus, subsequent encounter for fracture with routine healing: Secondary | ICD-10-CM | POA: Diagnosis not present

## 2021-11-28 DIAGNOSIS — N3281 Overactive bladder: Secondary | ICD-10-CM | POA: Diagnosis not present

## 2021-11-28 DIAGNOSIS — F32A Depression, unspecified: Secondary | ICD-10-CM | POA: Diagnosis not present

## 2021-11-28 DIAGNOSIS — G8929 Other chronic pain: Secondary | ICD-10-CM | POA: Diagnosis not present

## 2021-11-28 DIAGNOSIS — Z87891 Personal history of nicotine dependence: Secondary | ICD-10-CM | POA: Diagnosis not present

## 2021-11-29 ENCOUNTER — Telehealth: Payer: Self-pay | Admitting: Family Medicine

## 2021-11-29 DIAGNOSIS — Z993 Dependence on wheelchair: Secondary | ICD-10-CM | POA: Diagnosis not present

## 2021-11-29 DIAGNOSIS — N3281 Overactive bladder: Secondary | ICD-10-CM | POA: Diagnosis not present

## 2021-11-29 DIAGNOSIS — S22088D Other fracture of T11-T12 vertebra, subsequent encounter for fracture with routine healing: Secondary | ICD-10-CM | POA: Diagnosis not present

## 2021-11-29 DIAGNOSIS — D649 Anemia, unspecified: Secondary | ICD-10-CM | POA: Diagnosis not present

## 2021-11-29 DIAGNOSIS — Z87440 Personal history of urinary (tract) infections: Secondary | ICD-10-CM | POA: Diagnosis not present

## 2021-11-29 DIAGNOSIS — S22078D Other fracture of T9-T10 vertebra, subsequent encounter for fracture with routine healing: Secondary | ICD-10-CM | POA: Diagnosis not present

## 2021-11-29 DIAGNOSIS — Z87891 Personal history of nicotine dependence: Secondary | ICD-10-CM | POA: Diagnosis not present

## 2021-11-29 DIAGNOSIS — M549 Dorsalgia, unspecified: Secondary | ICD-10-CM | POA: Diagnosis not present

## 2021-11-29 DIAGNOSIS — M1991 Primary osteoarthritis, unspecified site: Secondary | ICD-10-CM | POA: Diagnosis not present

## 2021-11-29 DIAGNOSIS — I10 Essential (primary) hypertension: Secondary | ICD-10-CM | POA: Diagnosis not present

## 2021-11-29 DIAGNOSIS — J9612 Chronic respiratory failure with hypercapnia: Secondary | ICD-10-CM | POA: Diagnosis not present

## 2021-11-29 DIAGNOSIS — E785 Hyperlipidemia, unspecified: Secondary | ICD-10-CM | POA: Diagnosis not present

## 2021-11-29 DIAGNOSIS — G8929 Other chronic pain: Secondary | ICD-10-CM | POA: Diagnosis not present

## 2021-11-29 DIAGNOSIS — G4733 Obstructive sleep apnea (adult) (pediatric): Secondary | ICD-10-CM | POA: Diagnosis not present

## 2021-11-29 DIAGNOSIS — E039 Hypothyroidism, unspecified: Secondary | ICD-10-CM | POA: Diagnosis not present

## 2021-11-29 DIAGNOSIS — F32A Depression, unspecified: Secondary | ICD-10-CM | POA: Diagnosis not present

## 2021-11-29 DIAGNOSIS — S52501D Unspecified fracture of the lower end of right radius, subsequent encounter for closed fracture with routine healing: Secondary | ICD-10-CM | POA: Diagnosis not present

## 2021-11-29 DIAGNOSIS — E559 Vitamin D deficiency, unspecified: Secondary | ICD-10-CM | POA: Diagnosis not present

## 2021-11-29 DIAGNOSIS — Z9181 History of falling: Secondary | ICD-10-CM | POA: Diagnosis not present

## 2021-11-29 DIAGNOSIS — E538 Deficiency of other specified B group vitamins: Secondary | ICD-10-CM | POA: Diagnosis not present

## 2021-11-29 DIAGNOSIS — J9611 Chronic respiratory failure with hypoxia: Secondary | ICD-10-CM | POA: Diagnosis not present

## 2021-11-29 DIAGNOSIS — S42211D Unspecified displaced fracture of surgical neck of right humerus, subsequent encounter for fracture with routine healing: Secondary | ICD-10-CM | POA: Diagnosis not present

## 2021-11-29 NOTE — Telephone Encounter (Signed)
Patient does not want to take PROLIA any longer.  Wanted to let Dr. Warrick Parisian know.

## 2021-11-30 DIAGNOSIS — Z9181 History of falling: Secondary | ICD-10-CM | POA: Diagnosis not present

## 2021-11-30 DIAGNOSIS — G4733 Obstructive sleep apnea (adult) (pediatric): Secondary | ICD-10-CM | POA: Diagnosis not present

## 2021-11-30 DIAGNOSIS — E039 Hypothyroidism, unspecified: Secondary | ICD-10-CM | POA: Diagnosis not present

## 2021-11-30 DIAGNOSIS — E559 Vitamin D deficiency, unspecified: Secondary | ICD-10-CM | POA: Diagnosis not present

## 2021-11-30 DIAGNOSIS — G8929 Other chronic pain: Secondary | ICD-10-CM | POA: Diagnosis not present

## 2021-11-30 DIAGNOSIS — I10 Essential (primary) hypertension: Secondary | ICD-10-CM | POA: Diagnosis not present

## 2021-11-30 DIAGNOSIS — E785 Hyperlipidemia, unspecified: Secondary | ICD-10-CM | POA: Diagnosis not present

## 2021-11-30 DIAGNOSIS — E538 Deficiency of other specified B group vitamins: Secondary | ICD-10-CM | POA: Diagnosis not present

## 2021-11-30 DIAGNOSIS — Z993 Dependence on wheelchair: Secondary | ICD-10-CM | POA: Diagnosis not present

## 2021-11-30 DIAGNOSIS — Z87440 Personal history of urinary (tract) infections: Secondary | ICD-10-CM | POA: Diagnosis not present

## 2021-11-30 DIAGNOSIS — S42211D Unspecified displaced fracture of surgical neck of right humerus, subsequent encounter for fracture with routine healing: Secondary | ICD-10-CM | POA: Diagnosis not present

## 2021-11-30 DIAGNOSIS — S52501D Unspecified fracture of the lower end of right radius, subsequent encounter for closed fracture with routine healing: Secondary | ICD-10-CM | POA: Diagnosis not present

## 2021-11-30 DIAGNOSIS — J9612 Chronic respiratory failure with hypercapnia: Secondary | ICD-10-CM | POA: Diagnosis not present

## 2021-11-30 DIAGNOSIS — F32A Depression, unspecified: Secondary | ICD-10-CM | POA: Diagnosis not present

## 2021-11-30 DIAGNOSIS — D649 Anemia, unspecified: Secondary | ICD-10-CM | POA: Diagnosis not present

## 2021-11-30 DIAGNOSIS — S22078D Other fracture of T9-T10 vertebra, subsequent encounter for fracture with routine healing: Secondary | ICD-10-CM | POA: Diagnosis not present

## 2021-11-30 DIAGNOSIS — S22088D Other fracture of T11-T12 vertebra, subsequent encounter for fracture with routine healing: Secondary | ICD-10-CM | POA: Diagnosis not present

## 2021-11-30 DIAGNOSIS — Z87891 Personal history of nicotine dependence: Secondary | ICD-10-CM | POA: Diagnosis not present

## 2021-11-30 DIAGNOSIS — M1991 Primary osteoarthritis, unspecified site: Secondary | ICD-10-CM | POA: Diagnosis not present

## 2021-11-30 DIAGNOSIS — N3281 Overactive bladder: Secondary | ICD-10-CM | POA: Diagnosis not present

## 2021-11-30 DIAGNOSIS — M549 Dorsalgia, unspecified: Secondary | ICD-10-CM | POA: Diagnosis not present

## 2021-11-30 DIAGNOSIS — J9611 Chronic respiratory failure with hypoxia: Secondary | ICD-10-CM | POA: Diagnosis not present

## 2021-11-30 NOTE — Telephone Encounter (Signed)
Okay tell her thanks for letting me know, we will discuss it further at her next visit.

## 2021-12-01 ENCOUNTER — Telehealth: Payer: Self-pay | Admitting: Family Medicine

## 2021-12-01 NOTE — Telephone Encounter (Signed)
Called - appt made  

## 2021-12-06 DIAGNOSIS — H47322 Drusen of optic disc, left eye: Secondary | ICD-10-CM | POA: Diagnosis not present

## 2021-12-06 DIAGNOSIS — H26493 Other secondary cataract, bilateral: Secondary | ICD-10-CM | POA: Diagnosis not present

## 2021-12-06 DIAGNOSIS — H04123 Dry eye syndrome of bilateral lacrimal glands: Secondary | ICD-10-CM | POA: Diagnosis not present

## 2021-12-06 DIAGNOSIS — H43811 Vitreous degeneration, right eye: Secondary | ICD-10-CM | POA: Diagnosis not present

## 2021-12-08 DIAGNOSIS — G8929 Other chronic pain: Secondary | ICD-10-CM | POA: Diagnosis not present

## 2021-12-08 DIAGNOSIS — S22088D Other fracture of T11-T12 vertebra, subsequent encounter for fracture with routine healing: Secondary | ICD-10-CM | POA: Diagnosis not present

## 2021-12-08 DIAGNOSIS — Z993 Dependence on wheelchair: Secondary | ICD-10-CM | POA: Diagnosis not present

## 2021-12-08 DIAGNOSIS — Z87891 Personal history of nicotine dependence: Secondary | ICD-10-CM | POA: Diagnosis not present

## 2021-12-08 DIAGNOSIS — E039 Hypothyroidism, unspecified: Secondary | ICD-10-CM | POA: Diagnosis not present

## 2021-12-08 DIAGNOSIS — Z9181 History of falling: Secondary | ICD-10-CM | POA: Diagnosis not present

## 2021-12-08 DIAGNOSIS — J9612 Chronic respiratory failure with hypercapnia: Secondary | ICD-10-CM | POA: Diagnosis not present

## 2021-12-08 DIAGNOSIS — G4733 Obstructive sleep apnea (adult) (pediatric): Secondary | ICD-10-CM | POA: Diagnosis not present

## 2021-12-08 DIAGNOSIS — I10 Essential (primary) hypertension: Secondary | ICD-10-CM | POA: Diagnosis not present

## 2021-12-08 DIAGNOSIS — E785 Hyperlipidemia, unspecified: Secondary | ICD-10-CM | POA: Diagnosis not present

## 2021-12-08 DIAGNOSIS — J9611 Chronic respiratory failure with hypoxia: Secondary | ICD-10-CM | POA: Diagnosis not present

## 2021-12-08 DIAGNOSIS — S52501D Unspecified fracture of the lower end of right radius, subsequent encounter for closed fracture with routine healing: Secondary | ICD-10-CM | POA: Diagnosis not present

## 2021-12-08 DIAGNOSIS — S42211D Unspecified displaced fracture of surgical neck of right humerus, subsequent encounter for fracture with routine healing: Secondary | ICD-10-CM | POA: Diagnosis not present

## 2021-12-08 DIAGNOSIS — E559 Vitamin D deficiency, unspecified: Secondary | ICD-10-CM | POA: Diagnosis not present

## 2021-12-08 DIAGNOSIS — M549 Dorsalgia, unspecified: Secondary | ICD-10-CM | POA: Diagnosis not present

## 2021-12-08 DIAGNOSIS — M1991 Primary osteoarthritis, unspecified site: Secondary | ICD-10-CM | POA: Diagnosis not present

## 2021-12-08 DIAGNOSIS — F32A Depression, unspecified: Secondary | ICD-10-CM | POA: Diagnosis not present

## 2021-12-08 DIAGNOSIS — Z87440 Personal history of urinary (tract) infections: Secondary | ICD-10-CM | POA: Diagnosis not present

## 2021-12-08 DIAGNOSIS — N3281 Overactive bladder: Secondary | ICD-10-CM | POA: Diagnosis not present

## 2021-12-08 DIAGNOSIS — D649 Anemia, unspecified: Secondary | ICD-10-CM | POA: Diagnosis not present

## 2021-12-08 DIAGNOSIS — S22078D Other fracture of T9-T10 vertebra, subsequent encounter for fracture with routine healing: Secondary | ICD-10-CM | POA: Diagnosis not present

## 2021-12-08 DIAGNOSIS — E538 Deficiency of other specified B group vitamins: Secondary | ICD-10-CM | POA: Diagnosis not present

## 2021-12-09 ENCOUNTER — Telehealth: Payer: Self-pay | Admitting: *Deleted

## 2021-12-09 DIAGNOSIS — S42211D Unspecified displaced fracture of surgical neck of right humerus, subsequent encounter for fracture with routine healing: Secondary | ICD-10-CM | POA: Diagnosis not present

## 2021-12-09 DIAGNOSIS — E039 Hypothyroidism, unspecified: Secondary | ICD-10-CM | POA: Diagnosis not present

## 2021-12-09 DIAGNOSIS — G8929 Other chronic pain: Secondary | ICD-10-CM | POA: Diagnosis not present

## 2021-12-09 DIAGNOSIS — E538 Deficiency of other specified B group vitamins: Secondary | ICD-10-CM | POA: Diagnosis not present

## 2021-12-09 DIAGNOSIS — I10 Essential (primary) hypertension: Secondary | ICD-10-CM | POA: Diagnosis not present

## 2021-12-09 DIAGNOSIS — S52501D Unspecified fracture of the lower end of right radius, subsequent encounter for closed fracture with routine healing: Secondary | ICD-10-CM | POA: Diagnosis not present

## 2021-12-09 DIAGNOSIS — J9611 Chronic respiratory failure with hypoxia: Secondary | ICD-10-CM | POA: Diagnosis not present

## 2021-12-09 DIAGNOSIS — S22078D Other fracture of T9-T10 vertebra, subsequent encounter for fracture with routine healing: Secondary | ICD-10-CM | POA: Diagnosis not present

## 2021-12-09 DIAGNOSIS — Z9181 History of falling: Secondary | ICD-10-CM | POA: Diagnosis not present

## 2021-12-09 DIAGNOSIS — E785 Hyperlipidemia, unspecified: Secondary | ICD-10-CM | POA: Diagnosis not present

## 2021-12-09 DIAGNOSIS — M549 Dorsalgia, unspecified: Secondary | ICD-10-CM | POA: Diagnosis not present

## 2021-12-09 DIAGNOSIS — M1991 Primary osteoarthritis, unspecified site: Secondary | ICD-10-CM | POA: Diagnosis not present

## 2021-12-09 DIAGNOSIS — N3281 Overactive bladder: Secondary | ICD-10-CM | POA: Diagnosis not present

## 2021-12-09 DIAGNOSIS — F32A Depression, unspecified: Secondary | ICD-10-CM | POA: Diagnosis not present

## 2021-12-09 DIAGNOSIS — S22088D Other fracture of T11-T12 vertebra, subsequent encounter for fracture with routine healing: Secondary | ICD-10-CM | POA: Diagnosis not present

## 2021-12-09 DIAGNOSIS — E559 Vitamin D deficiency, unspecified: Secondary | ICD-10-CM | POA: Diagnosis not present

## 2021-12-09 DIAGNOSIS — D649 Anemia, unspecified: Secondary | ICD-10-CM | POA: Diagnosis not present

## 2021-12-09 DIAGNOSIS — Z993 Dependence on wheelchair: Secondary | ICD-10-CM | POA: Diagnosis not present

## 2021-12-09 DIAGNOSIS — Z87891 Personal history of nicotine dependence: Secondary | ICD-10-CM | POA: Diagnosis not present

## 2021-12-09 DIAGNOSIS — J9612 Chronic respiratory failure with hypercapnia: Secondary | ICD-10-CM | POA: Diagnosis not present

## 2021-12-09 DIAGNOSIS — G4733 Obstructive sleep apnea (adult) (pediatric): Secondary | ICD-10-CM | POA: Diagnosis not present

## 2021-12-09 DIAGNOSIS — Z87440 Personal history of urinary (tract) infections: Secondary | ICD-10-CM | POA: Diagnosis not present

## 2021-12-09 NOTE — Telephone Encounter (Signed)
Defer to PCP return °

## 2021-12-09 NOTE — Telephone Encounter (Signed)
VM from Clarise Cruz OT w/ [facility] Pt's wgt today was 164.4 lb, lower end wgt parameter is 165 lb flagged to call provider Would like to know if this can be lowered Please advise

## 2021-12-12 DIAGNOSIS — J9611 Chronic respiratory failure with hypoxia: Secondary | ICD-10-CM | POA: Diagnosis not present

## 2021-12-12 DIAGNOSIS — E039 Hypothyroidism, unspecified: Secondary | ICD-10-CM | POA: Diagnosis not present

## 2021-12-12 DIAGNOSIS — J9612 Chronic respiratory failure with hypercapnia: Secondary | ICD-10-CM | POA: Diagnosis not present

## 2021-12-12 DIAGNOSIS — Z993 Dependence on wheelchair: Secondary | ICD-10-CM | POA: Diagnosis not present

## 2021-12-12 DIAGNOSIS — S42211D Unspecified displaced fracture of surgical neck of right humerus, subsequent encounter for fracture with routine healing: Secondary | ICD-10-CM | POA: Diagnosis not present

## 2021-12-12 DIAGNOSIS — Z9181 History of falling: Secondary | ICD-10-CM | POA: Diagnosis not present

## 2021-12-12 DIAGNOSIS — D649 Anemia, unspecified: Secondary | ICD-10-CM | POA: Diagnosis not present

## 2021-12-12 DIAGNOSIS — M549 Dorsalgia, unspecified: Secondary | ICD-10-CM | POA: Diagnosis not present

## 2021-12-12 DIAGNOSIS — E785 Hyperlipidemia, unspecified: Secondary | ICD-10-CM | POA: Diagnosis not present

## 2021-12-12 DIAGNOSIS — G8929 Other chronic pain: Secondary | ICD-10-CM | POA: Diagnosis not present

## 2021-12-12 DIAGNOSIS — M1991 Primary osteoarthritis, unspecified site: Secondary | ICD-10-CM | POA: Diagnosis not present

## 2021-12-12 DIAGNOSIS — S22078D Other fracture of T9-T10 vertebra, subsequent encounter for fracture with routine healing: Secondary | ICD-10-CM | POA: Diagnosis not present

## 2021-12-12 DIAGNOSIS — S52501D Unspecified fracture of the lower end of right radius, subsequent encounter for closed fracture with routine healing: Secondary | ICD-10-CM | POA: Diagnosis not present

## 2021-12-12 DIAGNOSIS — E559 Vitamin D deficiency, unspecified: Secondary | ICD-10-CM | POA: Diagnosis not present

## 2021-12-12 DIAGNOSIS — N3281 Overactive bladder: Secondary | ICD-10-CM | POA: Diagnosis not present

## 2021-12-12 DIAGNOSIS — E538 Deficiency of other specified B group vitamins: Secondary | ICD-10-CM | POA: Diagnosis not present

## 2021-12-12 DIAGNOSIS — S22088D Other fracture of T11-T12 vertebra, subsequent encounter for fracture with routine healing: Secondary | ICD-10-CM | POA: Diagnosis not present

## 2021-12-12 DIAGNOSIS — G4733 Obstructive sleep apnea (adult) (pediatric): Secondary | ICD-10-CM | POA: Diagnosis not present

## 2021-12-12 DIAGNOSIS — F32A Depression, unspecified: Secondary | ICD-10-CM | POA: Diagnosis not present

## 2021-12-12 DIAGNOSIS — Z87891 Personal history of nicotine dependence: Secondary | ICD-10-CM | POA: Diagnosis not present

## 2021-12-12 DIAGNOSIS — I10 Essential (primary) hypertension: Secondary | ICD-10-CM | POA: Diagnosis not present

## 2021-12-12 DIAGNOSIS — Z87440 Personal history of urinary (tract) infections: Secondary | ICD-10-CM | POA: Diagnosis not present

## 2021-12-12 NOTE — Telephone Encounter (Signed)
I do not have a diuretic on her list that I can see, please ask her if she is taking the diuretic to help with the CHF, that is why I am assuming they start the lower weight parameter for her but ask if she is taking something that we do not have or if she has a diuretic.

## 2021-12-12 NOTE — Telephone Encounter (Signed)
Pt did not answer and no vmial available

## 2021-12-13 NOTE — Telephone Encounter (Addendum)
Spoke with pt this morning. She states that she does not take a diuretic. Patient has an office visit with Dettinger 12/21. She was made aware and of the appt time. Asked about her BP and pt states that it is up and down. Detinger will discuss all during visit on 12/21.

## 2021-12-14 ENCOUNTER — Ambulatory Visit (INDEPENDENT_AMBULATORY_CARE_PROVIDER_SITE_OTHER): Payer: Medicare Other | Admitting: Family Medicine

## 2021-12-14 ENCOUNTER — Encounter: Payer: Self-pay | Admitting: Family Medicine

## 2021-12-14 VITALS — BP 143/79 | HR 79 | Ht 63.0 in | Wt 167.0 lb

## 2021-12-14 DIAGNOSIS — M5136 Other intervertebral disc degeneration, lumbar region: Secondary | ICD-10-CM | POA: Diagnosis not present

## 2021-12-14 DIAGNOSIS — S22080D Wedge compression fracture of T11-T12 vertebra, subsequent encounter for fracture with routine healing: Secondary | ICD-10-CM | POA: Diagnosis not present

## 2021-12-14 DIAGNOSIS — S22000S Wedge compression fracture of unspecified thoracic vertebra, sequela: Secondary | ICD-10-CM

## 2021-12-14 DIAGNOSIS — I5032 Chronic diastolic (congestive) heart failure: Secondary | ICD-10-CM | POA: Diagnosis not present

## 2021-12-14 DIAGNOSIS — B372 Candidiasis of skin and nail: Secondary | ICD-10-CM

## 2021-12-14 DIAGNOSIS — M8008XS Age-related osteoporosis with current pathological fracture, vertebra(e), sequela: Secondary | ICD-10-CM

## 2021-12-14 DIAGNOSIS — M5134 Other intervertebral disc degeneration, thoracic region: Secondary | ICD-10-CM | POA: Diagnosis not present

## 2021-12-14 MED ORDER — CELECOXIB 100 MG PO CAPS: 100.0000 mg | ORAL_CAPSULE | Freq: Two times a day (BID) | ORAL | 3 refills | Status: DC

## 2021-12-14 MED ORDER — NYSTATIN 100000 UNIT/GM EX POWD: Freq: Four times a day (QID) | CUTANEOUS | 1 refills | Status: DC

## 2021-12-14 NOTE — Progress Notes (Signed)
BP (!) 143/79    Pulse 79    Ht '5\' 3"'  (1.6 m)    Wt 167 lb (75.8 kg)    SpO2 94%    BMI 29.58 kg/m    Subjective:   Patient ID: Danielle Parrish, female    DOB: May 21, 1941, 80 y.o.   MRN: [id number]  HPI: Danielle Parrish is a 80 y.o. female presenting on 12/14/2021 for Hypotension   HPI Patient is coming in with complaints of hypotension occasionally and then high blood pressure when her pain is high.  She did have 1 episode of accidental opiate overdose on 08/06/2021 from oxycodone.  She says tramadol is insufficient and wants hydrocodone.  Her chart has been flagged because of the accidental overdose.  She does want something help with pain.  She says her shoulder is better but her back is especially what is bothering her now.  She does see Dr. Earleen Newport for her back with the surgeon.  Patient has multiple vertebral compression fractures and was on medicine for osteoporosis, Prolia but she stopped it on her own because she wants to have a tooth extraction and has had some new compression fracture since that time.  Discussed that with her and she says she wants to stay off of it.  Due to her back issues she does have difficulty getting up and down and in and out of chairs and sometimes she does have an oxygen tank where she uses 2-1/2 L that she has to take with her and she has difficulty taking it with her and walking when she has to take the tank.  She would like a lift chair and a wheelchair to help with these.  Normally she does use a cane but she cannot use the cane when she has her oxygen with her.  Relevant past medical, surgical, family and social history reviewed and updated as indicated. Interim medical history since our last visit reviewed. Allergies and medications reviewed and updated.  Review of Systems  Constitutional:  Negative for chills and fever.  Eyes:  Negative for visual disturbance.  Respiratory:  Positive for shortness of breath. Negative for cough, chest tightness  and wheezing.   Cardiovascular:  Negative for chest pain, palpitations and leg swelling.  Musculoskeletal:  Positive for arthralgias, back pain, gait problem and myalgias.  Skin:  Negative for rash.  Neurological:  Positive for weakness. Negative for light-headedness and headaches.  Psychiatric/Behavioral:  Negative for agitation and behavioral problems.   All other systems reviewed and are negative.  Per HPI unless specifically indicated above   Allergies as of 12/14/2021       Reactions   Crestor [rosuvastatin] Other (See Comments)   Body aches.    Nsaids    Burns stomach.    Vytorin [ezetimibe-simvastatin]    Body aches.    Aspirin Nausea Only   Tolmetin Nausea Only   Burns stomach.         Medication List        Accurate as of December 14, 2021  4:18 PM. If you have any questions, ask your nurse or doctor.          STOP taking these medications    HYDROcodone-acetaminophen 5-325 MG tablet Commonly known as: NORCO/VICODIN Stopped by: Fransisca Kaufmann Kin Galbraith, MD       TAKE these medications    acetaminophen 500 MG tablet Commonly known as: TYLENOL Take 1 tablet (500 mg total) by mouth every 6 (six) hours as needed.  albuterol 108 (90 Base) MCG/ACT inhaler Commonly known as: VENTOLIN HFA TAKE 2 PUFFS BY MOUTH EVERY 6 HOURS AS NEEDED FOR WHEEZE OR SHORTNESS OF BREATH   amLODipine 10 MG tablet Commonly known as: NORVASC Take 1 tablet (10 mg total) by mouth daily.   calcium-vitamin D 500-200 MG-UNIT Tabs tablet Commonly known as: OSCAL WITH D TAKE 1 TABLET BY MOUTH EVERY DAY WITH BREAKFAST   celecoxib 100 MG capsule Commonly known as: CeleBREX Take 1 capsule (100 mg total) by mouth 2 (two) times daily. Started by: Worthy Rancher, MD   cyclobenzaprine 5 MG tablet Commonly known as: FLEXERIL Take 1 tablet (5 mg total) by mouth 3 (three) times daily as needed for muscle spasms.   diclofenac Sodium 1 % Gel Commonly known as: VOLTAREN APPLY 4 GRAMS  TOPICALLY 4 (FOUR) TIMES DAILY.   diphenhydrAMINE 25 MG tablet Commonly known as: BENADRYL Take 50 mg by mouth at bedtime as needed for allergies.   fluconazole 150 MG tablet Commonly known as: DIFLUCAN Take 1 tablet (150 mg total) by mouth every three (3) days as needed.   gabapentin 300 MG capsule Commonly known as: NEURONTIN Take 300 mg by mouth 3 (three) times daily.   ketoconazole 2 % cream Commonly known as: NIZORAL APPLY TO AFFECTED AREA EVERYDAY   levothyroxine 100 MCG tablet Commonly known as: SYNTHROID Take 1 tablet (100 mcg total) by mouth daily before breakfast.   lidocaine 5 % Commonly known as: LIDODERM PLACE 1 PATCH ONTO THE SKIN DAILY. REMOVE & DISCARD PATCH WITHIN 12 HOURS OR AS DIRECTED BY MD   lisinopril 20 MG tablet Commonly known as: ZESTRIL Take 1 tablet (20 mg total) by mouth daily.   lovastatin 40 MG tablet Commonly known as: MEVACOR Take 1 tablet (40 mg total) by mouth at bedtime. (NEEDS TO BE SEEN BEFORE NEXT REFILL)   metoprolol succinate 25 MG 24 hr tablet Commonly known as: TOPROL-XL Take 1 tablet (25 mg total) by mouth daily.   mirabegron ER 25 MG Tb24 tablet Commonly known as: Myrbetriq Take 1 tablet (25 mg total) by mouth daily.   Multi-Vitamin tablet Take by mouth.   nitrofurantoin (macrocrystal-monohydrate) 100 MG capsule Commonly known as: MACROBID Take 100 mg by mouth daily.   nystatin cream Commonly known as: MYCOSTATIN Apply 1 application topically 2 (two) times daily.   nystatin powder Commonly known as: MYCOSTATIN/NYSTOP Apply topically 4 (four) times daily.   omeprazole 20 MG capsule Commonly known as: PRILOSEC Take 1 capsule (20 mg total) by mouth 2 (two) times daily before a meal.   PRESERVISION AREDS 2 PO Take by mouth.   denosumab 60 MG/ML Sosy injection Commonly known as: PROLIA Bring to your doctor to inject 60 mg subcutaneously.   Prolia 60 MG/ML Sosy injection Generic drug: denosumab TO BE  ADMINISTERED IN PHYSICIAN'S OFFICE. INJECT ONE SYRINGE SUBCOUSLY ONCE EVERY 6 MONTHS. REFRIGERATE. USE WITHIN 14 DAYS ONCE AT ROOM TEMPERATURE.   pyridOXINE 100 MG tablet Commonly known as: VITAMIN B-6 Take 100 mg by mouth daily.   sertraline 100 MG tablet Commonly known as: ZOLOFT Take 2 tablets (200 mg total) by mouth daily.   sodium chloride 0.65 % Soln nasal spray Commonly known as: OCEAN Place 1 spray into both nostrils as needed for congestion.   VITAMIN D-3 PO Take 1 capsule by mouth daily.               Durable Medical Equipment  (From admission, onward)  Start     Ordered   12/14/21 0000  For home use only DME Other see comment       Comments: Diagnosis multiple vertebral compression fractures and degenerative disc disease  Cane, sized to fit  Question:  Length of Need  Answer:  Lifetime   12/14/21 1616   12/14/21 0000  For home use only DME Other see comment       Comments: Diagnosis multiple vertebral compression fractures and degenerative disc disease Lift chair  Question:  Length of Need  Answer:  Lifetime   12/14/21 1616   12/14/21 0000  DME Wheelchair manual       Comments: Patient suffers from degenerative disc disease and multiple vertebral compression fractures which impairs their ability to perform daily activities like grooming in the home when she has to have her oxygen with her.  A cane will not resolve issue with performing activities of daily living when she has to carry her oxygen. A wheelchair will allow patient to safely perform daily activities. Patient can safely propel the wheelchair in the home or has a caregiver who can provide assistance. Length of need Lifetime. Accessories: elevating leg rests (ELRs), wheel locks, extensions and anti-tippers.   12/14/21 1616             Objective:   BP (!) 143/79    Pulse 79    Ht '5\' 3"'  (1.6 m)    Wt 167 lb (75.8 kg)    SpO2 94%    BMI 29.58 kg/m   Wt Readings from Last 3  Encounters:  12/14/21 167 lb (75.8 kg)  10/28/21 176 lb 6.4 oz (80 kg)  10/17/21 176 lb 6.4 oz (80 kg)    Physical Exam Vitals and nursing note reviewed.  Constitutional:      General: She is not in acute distress.    Appearance: She is well-developed. She is not diaphoretic.  Eyes:     Conjunctiva/sclera: Conjunctivae normal.  Cardiovascular:     Rate and Rhythm: Normal rate and regular rhythm.     Heart sounds: Normal heart sounds. No murmur heard. Pulmonary:     Effort: Pulmonary effort is normal. No respiratory distress.     Breath sounds: Normal breath sounds. No wheezing.  Musculoskeletal:        General: No swelling. Normal range of motion.  Skin:    General: Skin is warm and dry.     Findings: No rash.  Neurological:     Mental Status: She is alert and oriented to person, place, and time.     Coordination: Coordination normal.  Psychiatric:        Behavior: Behavior normal.      Assessment & Plan:   Problem List Items Addressed This Visit       Cardiovascular and Mediastinum   Chronic diastolic heart failure ([place])   Relevant Orders   For home use only DME Other see comment   For home use only DME Other see comment   DME Wheelchair manual   CBC with Differential/Platelet   CMP14+EGFR     Musculoskeletal and Integument   Compression fracture of T12 vertebra ([place])   Relevant Orders   For home use only DME Other see comment   For home use only DME Other see comment   DME Wheelchair manual   Age-related osteoporosis with current pathol fracture of vertebra ([place])   Other Visit Diagnoses     Compression fracture of thoracic vertebra, unspecified thoracic vertebral level,  sequela    -  Primary   Relevant Orders   For home use only DME Other see comment   For home use only DME Other see comment   DME Wheelchair manual   Skin candidiasis       Relevant Medications   nystatin (MYCOSTATIN/NYSTOP) powder   Degenerative disc disease, thoracic       Relevant  Medications   celecoxib (CELEBREX) 100 MG capsule   Other Relevant Orders   For home use only DME Other see comment   For home use only DME Other see comment   DME Wheelchair manual   Degenerative disc disease, lumbar       Relevant Medications   celecoxib (CELEBREX) 100 MG capsule   Other Relevant Orders   For home use only DME Other see comment   For home use only DME Other see comment   DME Wheelchair manual       Using cane today but does have difficulty getting up and down out of chairs.  She says the main reason she wants a wheelchair is because she has difficulty carrying that with her cane when she goes out with her oxygen.  She does not require oxygen all the time but sometimes she does have to use during the daytime.  We will also order a lift chair to help her get up and down out of chairs at home because she cannot currently with her current back pain.  Patient is in a back brace today and sees Dr. Earleen Newport Follow up plan: Return in about 3 months (around 03/14/2022), or if symptoms worsen or fail to improve, for Recheck chronic medical issues.  Counseling provided for all of the vaccine components Orders Placed This Encounter  Procedures   For home use only DME Other see comment   For home use only DME Other see comment   DME Wheelchair manual   CBC with Differential/Platelet   CMP14+EGFR    Caryl Pina, MD [facility] 12/14/2021, 4:18 PM

## 2021-12-15 DIAGNOSIS — S42211D Unspecified displaced fracture of surgical neck of right humerus, subsequent encounter for fracture with routine healing: Secondary | ICD-10-CM | POA: Diagnosis not present

## 2021-12-15 DIAGNOSIS — Z87440 Personal history of urinary (tract) infections: Secondary | ICD-10-CM | POA: Diagnosis not present

## 2021-12-15 DIAGNOSIS — S22078D Other fracture of T9-T10 vertebra, subsequent encounter for fracture with routine healing: Secondary | ICD-10-CM | POA: Diagnosis not present

## 2021-12-15 DIAGNOSIS — D649 Anemia, unspecified: Secondary | ICD-10-CM | POA: Diagnosis not present

## 2021-12-15 DIAGNOSIS — E559 Vitamin D deficiency, unspecified: Secondary | ICD-10-CM | POA: Diagnosis not present

## 2021-12-15 DIAGNOSIS — Z9181 History of falling: Secondary | ICD-10-CM | POA: Diagnosis not present

## 2021-12-15 DIAGNOSIS — N3281 Overactive bladder: Secondary | ICD-10-CM | POA: Diagnosis not present

## 2021-12-15 DIAGNOSIS — M549 Dorsalgia, unspecified: Secondary | ICD-10-CM | POA: Diagnosis not present

## 2021-12-15 DIAGNOSIS — I10 Essential (primary) hypertension: Secondary | ICD-10-CM | POA: Diagnosis not present

## 2021-12-15 DIAGNOSIS — E785 Hyperlipidemia, unspecified: Secondary | ICD-10-CM | POA: Diagnosis not present

## 2021-12-15 DIAGNOSIS — S52501D Unspecified fracture of the lower end of right radius, subsequent encounter for closed fracture with routine healing: Secondary | ICD-10-CM | POA: Diagnosis not present

## 2021-12-15 DIAGNOSIS — G4733 Obstructive sleep apnea (adult) (pediatric): Secondary | ICD-10-CM | POA: Diagnosis not present

## 2021-12-15 DIAGNOSIS — Z87891 Personal history of nicotine dependence: Secondary | ICD-10-CM | POA: Diagnosis not present

## 2021-12-15 DIAGNOSIS — F32A Depression, unspecified: Secondary | ICD-10-CM | POA: Diagnosis not present

## 2021-12-15 DIAGNOSIS — G8929 Other chronic pain: Secondary | ICD-10-CM | POA: Diagnosis not present

## 2021-12-15 DIAGNOSIS — J9612 Chronic respiratory failure with hypercapnia: Secondary | ICD-10-CM | POA: Diagnosis not present

## 2021-12-15 DIAGNOSIS — S22088D Other fracture of T11-T12 vertebra, subsequent encounter for fracture with routine healing: Secondary | ICD-10-CM | POA: Diagnosis not present

## 2021-12-15 DIAGNOSIS — M1991 Primary osteoarthritis, unspecified site: Secondary | ICD-10-CM | POA: Diagnosis not present

## 2021-12-15 DIAGNOSIS — E039 Hypothyroidism, unspecified: Secondary | ICD-10-CM | POA: Diagnosis not present

## 2021-12-15 DIAGNOSIS — Z993 Dependence on wheelchair: Secondary | ICD-10-CM | POA: Diagnosis not present

## 2021-12-15 DIAGNOSIS — J9611 Chronic respiratory failure with hypoxia: Secondary | ICD-10-CM | POA: Diagnosis not present

## 2021-12-15 DIAGNOSIS — E538 Deficiency of other specified B group vitamins: Secondary | ICD-10-CM | POA: Diagnosis not present

## 2021-12-15 LAB — CBC WITH DIFFERENTIAL/PLATELET
Basophils Absolute: 0 10*3/uL (ref 0.0–0.2)
Basos: 1 %
EOS (ABSOLUTE): 0.1 10*3/uL (ref 0.0–0.4)
Eos: 2 %
Hematocrit: 38.9 % (ref 34.0–46.6)
Hemoglobin: 13.1 g/dL (ref 11.1–15.9)
Immature Grans (Abs): 0 10*3/uL (ref 0.0–0.1)
Immature Granulocytes: 0 %
Lymphocytes Absolute: 2.5 10*3/uL (ref 0.7–3.1)
Lymphs: 35 %
MCH: 29.8 pg (ref 26.6–33.0)
MCHC: 33.7 g/dL (ref 31.5–35.7)
MCV: 89 fL (ref 79–97)
Monocytes Absolute: 0.6 10*3/uL (ref 0.1–0.9)
Monocytes: 8 %
Neutrophils Absolute: 4 10*3/uL (ref 1.4–7.0)
Neutrophils: 54 %
Platelets: 240 10*3/uL (ref 150–450)
RBC: 4.39 x10E6/uL (ref 3.77–5.28)
RDW: 13.6 % (ref 11.7–15.4)
WBC: 7.3 10*3/uL (ref 3.4–10.8)

## 2021-12-15 LAB — CMP14+EGFR
ALT: 11 IU/L (ref 0–32)
AST: 21 IU/L (ref 0–40)
Albumin/Globulin Ratio: 2.1 (ref 1.2–2.2)
Albumin: 4.6 g/dL (ref 3.6–4.6)
Alkaline Phosphatase: 157 IU/L — ABNORMAL HIGH (ref 44–121)
BUN/Creatinine Ratio: 15 (ref 12–28)
BUN: 14 mg/dL (ref 8–27)
Bilirubin Total: 0.3 mg/dL (ref 0.0–1.2)
CO2: 26 mmol/L (ref 20–29)
Calcium: 10.7 mg/dL — ABNORMAL HIGH (ref 8.7–10.3)
Chloride: 97 mmol/L (ref 96–106)
Creatinine, Ser: 0.95 mg/dL (ref 0.57–1.00)
Globulin, Total: 2.2 g/dL (ref 1.5–4.5)
Glucose: 88 mg/dL (ref 70–99)
Potassium: 5.5 mmol/L — ABNORMAL HIGH (ref 3.5–5.2)
Sodium: 136 mmol/L (ref 134–144)
Total Protein: 6.8 g/dL (ref 6.0–8.5)
eGFR: 60 mL/min/{1.73_m2} (ref >59)

## 2021-12-16 ENCOUNTER — Telehealth: Payer: Self-pay | Admitting: Family Medicine

## 2021-12-16 NOTE — Telephone Encounter (Signed)
Judson Roch made aware of Dettinger's recommendations

## 2021-12-16 NOTE — Telephone Encounter (Signed)
Make the minimum weight 160 pounds for now

## 2021-12-17 DIAGNOSIS — R0902 Hypoxemia: Secondary | ICD-10-CM | POA: Diagnosis not present

## 2021-12-21 DIAGNOSIS — D649 Anemia, unspecified: Secondary | ICD-10-CM | POA: Diagnosis not present

## 2021-12-21 DIAGNOSIS — G8929 Other chronic pain: Secondary | ICD-10-CM | POA: Diagnosis not present

## 2021-12-21 DIAGNOSIS — S22088D Other fracture of T11-T12 vertebra, subsequent encounter for fracture with routine healing: Secondary | ICD-10-CM | POA: Diagnosis not present

## 2021-12-21 DIAGNOSIS — M549 Dorsalgia, unspecified: Secondary | ICD-10-CM | POA: Diagnosis not present

## 2021-12-21 DIAGNOSIS — E559 Vitamin D deficiency, unspecified: Secondary | ICD-10-CM | POA: Diagnosis not present

## 2021-12-21 DIAGNOSIS — S42211D Unspecified displaced fracture of surgical neck of right humerus, subsequent encounter for fracture with routine healing: Secondary | ICD-10-CM | POA: Diagnosis not present

## 2021-12-21 DIAGNOSIS — S22078D Other fracture of T9-T10 vertebra, subsequent encounter for fracture with routine healing: Secondary | ICD-10-CM | POA: Diagnosis not present

## 2021-12-21 DIAGNOSIS — E785 Hyperlipidemia, unspecified: Secondary | ICD-10-CM | POA: Diagnosis not present

## 2021-12-21 DIAGNOSIS — J9612 Chronic respiratory failure with hypercapnia: Secondary | ICD-10-CM | POA: Diagnosis not present

## 2021-12-21 DIAGNOSIS — Z87891 Personal history of nicotine dependence: Secondary | ICD-10-CM | POA: Diagnosis not present

## 2021-12-21 DIAGNOSIS — G4733 Obstructive sleep apnea (adult) (pediatric): Secondary | ICD-10-CM | POA: Diagnosis not present

## 2021-12-21 DIAGNOSIS — Z9181 History of falling: Secondary | ICD-10-CM | POA: Diagnosis not present

## 2021-12-21 DIAGNOSIS — E538 Deficiency of other specified B group vitamins: Secondary | ICD-10-CM | POA: Diagnosis not present

## 2021-12-21 DIAGNOSIS — Z993 Dependence on wheelchair: Secondary | ICD-10-CM | POA: Diagnosis not present

## 2021-12-21 DIAGNOSIS — S32000A Wedge compression fracture of unspecified lumbar vertebra, initial encounter for closed fracture: Secondary | ICD-10-CM | POA: Diagnosis not present

## 2021-12-21 DIAGNOSIS — F32A Depression, unspecified: Secondary | ICD-10-CM | POA: Diagnosis not present

## 2021-12-21 DIAGNOSIS — I10 Essential (primary) hypertension: Secondary | ICD-10-CM | POA: Diagnosis not present

## 2021-12-21 DIAGNOSIS — E039 Hypothyroidism, unspecified: Secondary | ICD-10-CM | POA: Diagnosis not present

## 2021-12-21 DIAGNOSIS — M1991 Primary osteoarthritis, unspecified site: Secondary | ICD-10-CM | POA: Diagnosis not present

## 2021-12-21 DIAGNOSIS — Z87440 Personal history of urinary (tract) infections: Secondary | ICD-10-CM | POA: Diagnosis not present

## 2021-12-21 DIAGNOSIS — J9611 Chronic respiratory failure with hypoxia: Secondary | ICD-10-CM | POA: Diagnosis not present

## 2021-12-21 DIAGNOSIS — N3281 Overactive bladder: Secondary | ICD-10-CM | POA: Diagnosis not present

## 2021-12-21 DIAGNOSIS — S52501D Unspecified fracture of the lower end of right radius, subsequent encounter for closed fracture with routine healing: Secondary | ICD-10-CM | POA: Diagnosis not present

## 2021-12-22 DIAGNOSIS — M1991 Primary osteoarthritis, unspecified site: Secondary | ICD-10-CM | POA: Diagnosis not present

## 2021-12-22 DIAGNOSIS — J9611 Chronic respiratory failure with hypoxia: Secondary | ICD-10-CM | POA: Diagnosis not present

## 2021-12-22 DIAGNOSIS — Z993 Dependence on wheelchair: Secondary | ICD-10-CM | POA: Diagnosis not present

## 2021-12-22 DIAGNOSIS — I10 Essential (primary) hypertension: Secondary | ICD-10-CM | POA: Diagnosis not present

## 2021-12-22 DIAGNOSIS — M549 Dorsalgia, unspecified: Secondary | ICD-10-CM | POA: Diagnosis not present

## 2021-12-22 DIAGNOSIS — E538 Deficiency of other specified B group vitamins: Secondary | ICD-10-CM | POA: Diagnosis not present

## 2021-12-22 DIAGNOSIS — S52501D Unspecified fracture of the lower end of right radius, subsequent encounter for closed fracture with routine healing: Secondary | ICD-10-CM | POA: Diagnosis not present

## 2021-12-22 DIAGNOSIS — N3281 Overactive bladder: Secondary | ICD-10-CM | POA: Diagnosis not present

## 2021-12-22 DIAGNOSIS — S22088D Other fracture of T11-T12 vertebra, subsequent encounter for fracture with routine healing: Secondary | ICD-10-CM | POA: Diagnosis not present

## 2021-12-22 DIAGNOSIS — E039 Hypothyroidism, unspecified: Secondary | ICD-10-CM | POA: Diagnosis not present

## 2021-12-22 DIAGNOSIS — D649 Anemia, unspecified: Secondary | ICD-10-CM | POA: Diagnosis not present

## 2021-12-22 DIAGNOSIS — G4733 Obstructive sleep apnea (adult) (pediatric): Secondary | ICD-10-CM | POA: Diagnosis not present

## 2021-12-22 DIAGNOSIS — G8929 Other chronic pain: Secondary | ICD-10-CM | POA: Diagnosis not present

## 2021-12-22 DIAGNOSIS — Z9181 History of falling: Secondary | ICD-10-CM | POA: Diagnosis not present

## 2021-12-22 DIAGNOSIS — Z87891 Personal history of nicotine dependence: Secondary | ICD-10-CM | POA: Diagnosis not present

## 2021-12-22 DIAGNOSIS — J9612 Chronic respiratory failure with hypercapnia: Secondary | ICD-10-CM | POA: Diagnosis not present

## 2021-12-22 DIAGNOSIS — E559 Vitamin D deficiency, unspecified: Secondary | ICD-10-CM | POA: Diagnosis not present

## 2021-12-22 DIAGNOSIS — Z87440 Personal history of urinary (tract) infections: Secondary | ICD-10-CM | POA: Diagnosis not present

## 2021-12-22 DIAGNOSIS — S22078D Other fracture of T9-T10 vertebra, subsequent encounter for fracture with routine healing: Secondary | ICD-10-CM | POA: Diagnosis not present

## 2021-12-22 DIAGNOSIS — S42211D Unspecified displaced fracture of surgical neck of right humerus, subsequent encounter for fracture with routine healing: Secondary | ICD-10-CM | POA: Diagnosis not present

## 2021-12-22 DIAGNOSIS — F32A Depression, unspecified: Secondary | ICD-10-CM | POA: Diagnosis not present

## 2021-12-22 DIAGNOSIS — E785 Hyperlipidemia, unspecified: Secondary | ICD-10-CM | POA: Diagnosis not present

## 2021-12-23 DIAGNOSIS — S32029A Unspecified fracture of second lumbar vertebra, initial encounter for closed fracture: Secondary | ICD-10-CM | POA: Diagnosis not present

## 2021-12-23 DIAGNOSIS — S32000A Wedge compression fracture of unspecified lumbar vertebra, initial encounter for closed fracture: Secondary | ICD-10-CM | POA: Diagnosis not present

## 2021-12-23 DIAGNOSIS — H26493 Other secondary cataract, bilateral: Secondary | ICD-10-CM | POA: Diagnosis not present

## 2021-12-28 DIAGNOSIS — F32A Depression, unspecified: Secondary | ICD-10-CM | POA: Diagnosis not present

## 2021-12-28 DIAGNOSIS — J9611 Chronic respiratory failure with hypoxia: Secondary | ICD-10-CM | POA: Diagnosis not present

## 2021-12-28 DIAGNOSIS — M1991 Primary osteoarthritis, unspecified site: Secondary | ICD-10-CM | POA: Diagnosis not present

## 2021-12-28 DIAGNOSIS — I10 Essential (primary) hypertension: Secondary | ICD-10-CM | POA: Diagnosis not present

## 2021-12-28 DIAGNOSIS — E039 Hypothyroidism, unspecified: Secondary | ICD-10-CM | POA: Diagnosis not present

## 2021-12-28 DIAGNOSIS — G4733 Obstructive sleep apnea (adult) (pediatric): Secondary | ICD-10-CM | POA: Diagnosis not present

## 2021-12-28 DIAGNOSIS — S22088D Other fracture of T11-T12 vertebra, subsequent encounter for fracture with routine healing: Secondary | ICD-10-CM | POA: Diagnosis not present

## 2021-12-28 DIAGNOSIS — D649 Anemia, unspecified: Secondary | ICD-10-CM | POA: Diagnosis not present

## 2021-12-28 DIAGNOSIS — S22078D Other fracture of T9-T10 vertebra, subsequent encounter for fracture with routine healing: Secondary | ICD-10-CM | POA: Diagnosis not present

## 2021-12-28 DIAGNOSIS — Z993 Dependence on wheelchair: Secondary | ICD-10-CM | POA: Diagnosis not present

## 2021-12-28 DIAGNOSIS — Z87891 Personal history of nicotine dependence: Secondary | ICD-10-CM | POA: Diagnosis not present

## 2021-12-28 DIAGNOSIS — N3281 Overactive bladder: Secondary | ICD-10-CM | POA: Diagnosis not present

## 2021-12-28 DIAGNOSIS — Z9181 History of falling: Secondary | ICD-10-CM | POA: Diagnosis not present

## 2021-12-28 DIAGNOSIS — E538 Deficiency of other specified B group vitamins: Secondary | ICD-10-CM | POA: Diagnosis not present

## 2021-12-28 DIAGNOSIS — J9612 Chronic respiratory failure with hypercapnia: Secondary | ICD-10-CM | POA: Diagnosis not present

## 2021-12-28 DIAGNOSIS — M549 Dorsalgia, unspecified: Secondary | ICD-10-CM | POA: Diagnosis not present

## 2021-12-28 DIAGNOSIS — G8929 Other chronic pain: Secondary | ICD-10-CM | POA: Diagnosis not present

## 2021-12-28 DIAGNOSIS — Z87440 Personal history of urinary (tract) infections: Secondary | ICD-10-CM | POA: Diagnosis not present

## 2021-12-28 DIAGNOSIS — E785 Hyperlipidemia, unspecified: Secondary | ICD-10-CM | POA: Diagnosis not present

## 2021-12-28 DIAGNOSIS — S42211D Unspecified displaced fracture of surgical neck of right humerus, subsequent encounter for fracture with routine healing: Secondary | ICD-10-CM | POA: Diagnosis not present

## 2021-12-28 DIAGNOSIS — E559 Vitamin D deficiency, unspecified: Secondary | ICD-10-CM | POA: Diagnosis not present

## 2021-12-28 DIAGNOSIS — S52501D Unspecified fracture of the lower end of right radius, subsequent encounter for closed fracture with routine healing: Secondary | ICD-10-CM | POA: Diagnosis not present

## 2021-12-29 ENCOUNTER — Ambulatory Visit: Payer: Medicare Other | Admitting: Neurology

## 2021-12-29 DIAGNOSIS — S32020G Wedge compression fracture of second lumbar vertebra, subsequent encounter for fracture with delayed healing: Secondary | ICD-10-CM | POA: Diagnosis not present

## 2021-12-29 DIAGNOSIS — D649 Anemia, unspecified: Secondary | ICD-10-CM | POA: Diagnosis not present

## 2021-12-30 ENCOUNTER — Other Ambulatory Visit: Payer: Medicare Other

## 2021-12-30 DIAGNOSIS — D649 Anemia, unspecified: Secondary | ICD-10-CM | POA: Diagnosis not present

## 2021-12-30 DIAGNOSIS — S42121D Displaced fracture of acromial process, right shoulder, subsequent encounter for fracture with routine healing: Secondary | ICD-10-CM | POA: Diagnosis not present

## 2021-12-31 ENCOUNTER — Other Ambulatory Visit: Payer: Self-pay | Admitting: Family Medicine

## 2021-12-31 DIAGNOSIS — G8929 Other chronic pain: Secondary | ICD-10-CM

## 2021-12-31 DIAGNOSIS — M545 Low back pain, unspecified: Secondary | ICD-10-CM

## 2021-12-31 LAB — CMP14+EGFR
ALT: 11 IU/L (ref 0–32)
AST: 14 IU/L (ref 0–40)
Albumin/Globulin Ratio: 2.4 — ABNORMAL HIGH (ref 1.2–2.2)
Albumin: 4.7 g/dL — ABNORMAL HIGH (ref 3.6–4.6)
Alkaline Phosphatase: 112 IU/L (ref 44–121)
BUN/Creatinine Ratio: 15 (ref 12–28)
BUN: 10 mg/dL (ref 8–27)
Bilirubin Total: 0.5 mg/dL (ref 0.0–1.2)
CO2: 30 mmol/L — ABNORMAL HIGH (ref 20–29)
Calcium: 11.1 mg/dL — ABNORMAL HIGH (ref 8.7–10.3)
Chloride: 100 mmol/L (ref 96–106)
Creatinine, Ser: 0.68 mg/dL (ref 0.57–1.00)
Globulin, Total: 2 g/dL (ref 1.5–4.5)
Glucose: 104 mg/dL — ABNORMAL HIGH (ref 70–99)
Potassium: 5.3 mmol/L — ABNORMAL HIGH (ref 3.5–5.2)
Sodium: 139 mmol/L (ref 134–144)
Total Protein: 6.7 g/dL (ref 6.0–8.5)
eGFR: 87 mL/min/{1.73_m2} (ref >59)

## 2021-12-31 LAB — CBC WITH DIFFERENTIAL/PLATELET
Basophils Absolute: 0 10*3/uL (ref 0.0–0.2)
Basos: 1 %
EOS (ABSOLUTE): 0.1 10*3/uL (ref 0.0–0.4)
Eos: 2 %
Hematocrit: 39.6 % (ref 34.0–46.6)
Hemoglobin: 13.1 g/dL (ref 11.1–15.9)
Immature Grans (Abs): 0 10*3/uL (ref 0.0–0.1)
Immature Granulocytes: 0 %
Lymphocytes Absolute: 1.8 10*3/uL (ref 0.7–3.1)
Lymphs: 43 %
MCH: 30.4 pg (ref 26.6–33.0)
MCHC: 33.1 g/dL (ref 31.5–35.7)
MCV: 92 fL (ref 79–97)
Monocytes Absolute: 0.4 10*3/uL (ref 0.1–0.9)
Monocytes: 11 %
Neutrophils Absolute: 1.8 10*3/uL (ref 1.4–7.0)
Neutrophils: 43 %
Platelets: 238 10*3/uL (ref 150–450)
RBC: 4.31 x10E6/uL (ref 3.77–5.28)
RDW: 14.4 % (ref 11.7–15.4)
WBC: 4.1 10*3/uL (ref 3.4–10.8)

## 2022-01-03 ENCOUNTER — Telehealth: Payer: Self-pay | Admitting: Adult Health

## 2022-01-03 DIAGNOSIS — J9612 Chronic respiratory failure with hypercapnia: Secondary | ICD-10-CM | POA: Diagnosis not present

## 2022-01-03 DIAGNOSIS — G4733 Obstructive sleep apnea (adult) (pediatric): Secondary | ICD-10-CM | POA: Diagnosis not present

## 2022-01-03 DIAGNOSIS — E538 Deficiency of other specified B group vitamins: Secondary | ICD-10-CM | POA: Diagnosis not present

## 2022-01-03 DIAGNOSIS — Z87440 Personal history of urinary (tract) infections: Secondary | ICD-10-CM | POA: Diagnosis not present

## 2022-01-03 DIAGNOSIS — S42211D Unspecified displaced fracture of surgical neck of right humerus, subsequent encounter for fracture with routine healing: Secondary | ICD-10-CM | POA: Diagnosis not present

## 2022-01-03 DIAGNOSIS — Z993 Dependence on wheelchair: Secondary | ICD-10-CM | POA: Diagnosis not present

## 2022-01-03 DIAGNOSIS — E785 Hyperlipidemia, unspecified: Secondary | ICD-10-CM | POA: Diagnosis not present

## 2022-01-03 DIAGNOSIS — F32A Depression, unspecified: Secondary | ICD-10-CM | POA: Diagnosis not present

## 2022-01-03 DIAGNOSIS — E559 Vitamin D deficiency, unspecified: Secondary | ICD-10-CM | POA: Diagnosis not present

## 2022-01-03 DIAGNOSIS — J9611 Chronic respiratory failure with hypoxia: Secondary | ICD-10-CM | POA: Diagnosis not present

## 2022-01-03 DIAGNOSIS — M1991 Primary osteoarthritis, unspecified site: Secondary | ICD-10-CM | POA: Diagnosis not present

## 2022-01-03 DIAGNOSIS — D649 Anemia, unspecified: Secondary | ICD-10-CM | POA: Diagnosis not present

## 2022-01-03 DIAGNOSIS — N3281 Overactive bladder: Secondary | ICD-10-CM | POA: Diagnosis not present

## 2022-01-03 DIAGNOSIS — E039 Hypothyroidism, unspecified: Secondary | ICD-10-CM | POA: Diagnosis not present

## 2022-01-03 DIAGNOSIS — S22088D Other fracture of T11-T12 vertebra, subsequent encounter for fracture with routine healing: Secondary | ICD-10-CM | POA: Diagnosis not present

## 2022-01-03 DIAGNOSIS — M549 Dorsalgia, unspecified: Secondary | ICD-10-CM | POA: Diagnosis not present

## 2022-01-03 DIAGNOSIS — Z87891 Personal history of nicotine dependence: Secondary | ICD-10-CM | POA: Diagnosis not present

## 2022-01-03 DIAGNOSIS — S22078D Other fracture of T9-T10 vertebra, subsequent encounter for fracture with routine healing: Secondary | ICD-10-CM | POA: Diagnosis not present

## 2022-01-03 DIAGNOSIS — Z9181 History of falling: Secondary | ICD-10-CM | POA: Diagnosis not present

## 2022-01-03 DIAGNOSIS — S52501D Unspecified fracture of the lower end of right radius, subsequent encounter for closed fracture with routine healing: Secondary | ICD-10-CM | POA: Diagnosis not present

## 2022-01-03 DIAGNOSIS — G8929 Other chronic pain: Secondary | ICD-10-CM | POA: Diagnosis not present

## 2022-01-03 DIAGNOSIS — I10 Essential (primary) hypertension: Secondary | ICD-10-CM | POA: Diagnosis not present

## 2022-01-03 NOTE — Telephone Encounter (Signed)
Pt called has some questions about her CPAP machine. Pt requesting a call back.

## 2022-01-04 DIAGNOSIS — J9612 Chronic respiratory failure with hypercapnia: Secondary | ICD-10-CM | POA: Diagnosis not present

## 2022-01-04 DIAGNOSIS — G8929 Other chronic pain: Secondary | ICD-10-CM | POA: Diagnosis not present

## 2022-01-04 DIAGNOSIS — E538 Deficiency of other specified B group vitamins: Secondary | ICD-10-CM | POA: Diagnosis not present

## 2022-01-04 DIAGNOSIS — J9611 Chronic respiratory failure with hypoxia: Secondary | ICD-10-CM | POA: Diagnosis not present

## 2022-01-04 DIAGNOSIS — E559 Vitamin D deficiency, unspecified: Secondary | ICD-10-CM | POA: Diagnosis not present

## 2022-01-04 DIAGNOSIS — S22088D Other fracture of T11-T12 vertebra, subsequent encounter for fracture with routine healing: Secondary | ICD-10-CM | POA: Diagnosis not present

## 2022-01-04 DIAGNOSIS — S42211D Unspecified displaced fracture of surgical neck of right humerus, subsequent encounter for fracture with routine healing: Secondary | ICD-10-CM | POA: Diagnosis not present

## 2022-01-04 DIAGNOSIS — N3281 Overactive bladder: Secondary | ICD-10-CM | POA: Diagnosis not present

## 2022-01-04 DIAGNOSIS — E785 Hyperlipidemia, unspecified: Secondary | ICD-10-CM | POA: Diagnosis not present

## 2022-01-04 DIAGNOSIS — Z87440 Personal history of urinary (tract) infections: Secondary | ICD-10-CM | POA: Diagnosis not present

## 2022-01-04 DIAGNOSIS — S22078D Other fracture of T9-T10 vertebra, subsequent encounter for fracture with routine healing: Secondary | ICD-10-CM | POA: Diagnosis not present

## 2022-01-04 DIAGNOSIS — Z9181 History of falling: Secondary | ICD-10-CM | POA: Diagnosis not present

## 2022-01-04 DIAGNOSIS — D649 Anemia, unspecified: Secondary | ICD-10-CM | POA: Diagnosis not present

## 2022-01-04 DIAGNOSIS — Z993 Dependence on wheelchair: Secondary | ICD-10-CM | POA: Diagnosis not present

## 2022-01-04 DIAGNOSIS — Z87891 Personal history of nicotine dependence: Secondary | ICD-10-CM | POA: Diagnosis not present

## 2022-01-04 DIAGNOSIS — M549 Dorsalgia, unspecified: Secondary | ICD-10-CM | POA: Diagnosis not present

## 2022-01-04 DIAGNOSIS — I10 Essential (primary) hypertension: Secondary | ICD-10-CM | POA: Diagnosis not present

## 2022-01-04 DIAGNOSIS — E039 Hypothyroidism, unspecified: Secondary | ICD-10-CM | POA: Diagnosis not present

## 2022-01-04 DIAGNOSIS — M1991 Primary osteoarthritis, unspecified site: Secondary | ICD-10-CM | POA: Diagnosis not present

## 2022-01-04 DIAGNOSIS — G4733 Obstructive sleep apnea (adult) (pediatric): Secondary | ICD-10-CM | POA: Diagnosis not present

## 2022-01-04 DIAGNOSIS — S52501D Unspecified fracture of the lower end of right radius, subsequent encounter for closed fracture with routine healing: Secondary | ICD-10-CM | POA: Diagnosis not present

## 2022-01-04 DIAGNOSIS — F32A Depression, unspecified: Secondary | ICD-10-CM | POA: Diagnosis not present

## 2022-01-04 NOTE — Telephone Encounter (Signed)
I called pt.  She wears cpap (nasal pillows) but is now on oxygen per her pcp and is having low oxygen at night.  She is on O2 3L [place].  I relayed that she needs to contact DME [facility] to assist her in the bleeding of her  oxygen into her cpap.  She appreciated call and will call them to assist.  She will call back if needed.

## 2022-01-04 NOTE — Telephone Encounter (Signed)
Pt returned call , please call back   °

## 2022-01-04 NOTE — Telephone Encounter (Signed)
I called pt and she had another nurse with her at the house and asked to call me back.  I relayed that was fine.

## 2022-01-10 DIAGNOSIS — M8008XG Age-related osteoporosis with current pathological fracture, vertebra(e), subsequent encounter for fracture with delayed healing: Secondary | ICD-10-CM | POA: Diagnosis not present

## 2022-01-10 DIAGNOSIS — S32020G Wedge compression fracture of second lumbar vertebra, subsequent encounter for fracture with delayed healing: Secondary | ICD-10-CM | POA: Diagnosis not present

## 2022-01-10 DIAGNOSIS — M8008XA Age-related osteoporosis with current pathological fracture, vertebra(e), initial encounter for fracture: Secondary | ICD-10-CM | POA: Diagnosis not present

## 2022-01-10 DIAGNOSIS — S22030G Wedge compression fracture of third thoracic vertebra, subsequent encounter for fracture with delayed healing: Secondary | ICD-10-CM | POA: Diagnosis not present

## 2022-01-13 DIAGNOSIS — H905 Unspecified sensorineural hearing loss: Secondary | ICD-10-CM | POA: Diagnosis not present

## 2022-01-17 DIAGNOSIS — R0902 Hypoxemia: Secondary | ICD-10-CM | POA: Diagnosis not present

## 2022-01-24 DIAGNOSIS — M25511 Pain in right shoulder: Secondary | ICD-10-CM | POA: Diagnosis not present

## 2022-01-25 ENCOUNTER — Other Ambulatory Visit: Payer: Self-pay | Admitting: Family Medicine

## 2022-01-25 DIAGNOSIS — B3749 Other urogenital candidiasis: Secondary | ICD-10-CM

## 2022-01-25 NOTE — Telephone Encounter (Signed)
Last office visit 12/14/21 Last refill 09/28/21, 60 grams, 2 refills

## 2022-01-26 DIAGNOSIS — M8008XG Age-related osteoporosis with current pathological fracture, vertebra(e), subsequent encounter for fracture with delayed healing: Secondary | ICD-10-CM | POA: Diagnosis not present

## 2022-01-26 DIAGNOSIS — S22030G Wedge compression fracture of third thoracic vertebra, subsequent encounter for fracture with delayed healing: Secondary | ICD-10-CM | POA: Diagnosis not present

## 2022-01-26 DIAGNOSIS — S32020G Wedge compression fracture of second lumbar vertebra, subsequent encounter for fracture with delayed healing: Secondary | ICD-10-CM | POA: Diagnosis not present

## 2022-01-30 ENCOUNTER — Other Ambulatory Visit: Payer: Self-pay | Admitting: Family Medicine

## 2022-02-01 ENCOUNTER — Ambulatory Visit (INDEPENDENT_AMBULATORY_CARE_PROVIDER_SITE_OTHER): Payer: Medicare Other | Admitting: Nurse Practitioner

## 2022-02-01 ENCOUNTER — Encounter: Payer: Self-pay | Admitting: Nurse Practitioner

## 2022-02-01 VITALS — BP 121/66 | HR 58 | Temp 98.6°F | Ht 63.0 in | Wt 167.0 lb

## 2022-02-01 DIAGNOSIS — N644 Mastodynia: Secondary | ICD-10-CM | POA: Diagnosis not present

## 2022-02-01 DIAGNOSIS — R1084 Generalized abdominal pain: Secondary | ICD-10-CM | POA: Insufficient documentation

## 2022-02-01 NOTE — Assessment & Plan Note (Signed)
Patient has had this abdominal pain for 3 years. Patient wants a GI referral and no treatment for now.  Completed referral to GI

## 2022-02-01 NOTE — Progress Notes (Signed)
Acute Office Visit  Subjective:    Patient ID: Danielle Parrish, female    DOB: 1941-08-17, 81 y.o.   MRN: [id number]  Chief Complaint  Patient presents with   left breast pain    Abdominal Pain This is a chronic problem. The current episode started more than 1 year ago. The problem occurs intermittently. The problem has been unchanged. The pain is located in the generalized abdominal region. The pain is moderate. The quality of the pain is aching. The abdominal pain does not radiate. Pertinent negatives include no constipation, diarrhea, nausea or vomiting. The pain is relieved by Nothing.  Patient is in today Pain  She reports new onset  pain. was not an injury that may have caused the pain. The pain started 1 week ago and is staying constant. The pain does not radiate . The pain is described as aching, is 5/10 in intensity, occurring constantly. Symptoms are worse in the: morning, mid-day, afternoon, evening  Aggravating factors: none Relieving factors: none.  She has tried acetaminophen with little relief.     Past Medical History:  Diagnosis Date   Anxiety    Arthritis    CHF (congestive heart failure) (HCC)    Chronic pain of multiple joints    Depression    Essential hypertension    GERD (gastroesophageal reflux disease)    Hyperlipidemia    Hyperlipidemia associated with type 2 diabetes mellitus ([place])    Hypertension associated with type 2 diabetes mellitus ([place])    Hypothyroidism    Mild scoliosis 10/19/2021   Osteoporosis    Sleep apnea    CPAP   Type 2 diabetes mellitus with hypercholesterolemia ([place])    Vitamin D deficiency     Past Surgical History:  Procedure Laterality Date   ABDOMINAL HERNIA REPAIR     ABDOMINAL HYSTERECTOMY  1977   BREAST EXCISIONAL BIOPSY Right    EYE SURGERY  2020   bilateral cataract removal    KNEE ARTHROSCOPY Left 09/30/2014   Procedure: LEFT KNEE ARTHROSCOPY MEDIAL MENISECTOMY ABRASION CHONDROPLASTY SYNOVECTOMY SUPRAPATELLER  CUFF;  Surgeon: Tobi Bastos, MD;  Location: [facility];  Service: Orthopedics;  Laterality: Left;    Family History  Problem Relation Age of Onset   Hypertension Mother    Stroke Mother    Cancer Father     Social History   Socioeconomic History   Marital status: Widowed    Spouse name: Laverna Peace   Number of children: 2   Years of education: Not on file   Highest education level: High school graduate  Occupational History   Occupation: CNA    Comment: [employer]   Occupation: retired  Tobacco Use   Smoking status: Never   Smokeless tobacco: Never  Scientific laboratory technician Use: Never used  Substance and Sexual Activity   Alcohol use: No   Drug use: No   Sexual activity: Not Currently  Other Topics Concern   Not on file  Social History Narrative   Lives with her daughter   Social Determinants of Health   Financial Resource Strain: Low Risk    Difficulty of Paying Living Expenses: Not very hard  Food Insecurity: No Food Insecurity   Worried About Charity fundraiser in the Last Year: Never true   [place] in the Last Year: Never true  Transportation Needs: No Transportation Needs   Lack of Transportation (Medical): No   Lack of Transportation (Non-Medical): No  Physical Activity: Insufficiently  Active   Days of Exercise per Week: 3 days   Minutes of Exercise per Session: 20 min  Stress: No Stress Concern Present   Feeling of Stress : Not at all  Social Connections: Socially Isolated   Frequency of Communication with Friends and Family: More than three times a week   Frequency of Social Gatherings with Friends and Family: More than three times a week   Attends Religious Services: Never   Marine scientist or Organizations: No   Attends Archivist Meetings: Never   Marital Status: Widowed  Human resources officer Violence: Not At Risk   Fear of Current or Ex-Partner: No   Emotionally Abused: No   Physically Abused: No   Sexually Abused: No     Outpatient Medications Prior to Visit  Medication Sig Dispense Refill   acetaminophen (TYLENOL) 500 MG tablet Take 1 tablet (500 mg total) by mouth every 6 (six) hours as needed. 30 tablet 0   albuterol (VENTOLIN HFA) 108 (90 Base) MCG/ACT inhaler TAKE 2 PUFFS BY MOUTH EVERY 6 HOURS AS NEEDED FOR WHEEZE OR SHORTNESS OF BREATH 6.7 each 3   amLODipine (NORVASC) 10 MG tablet Take 1 tablet (10 mg total) by mouth daily. 90 tablet 3   amoxicillin (AMOXIL) 500 MG capsule Take 500 mg by mouth 4 (four) times daily.     calcitonin, salmon, (MIACALCIN/FORTICAL) 200 UNIT/ACT nasal spray 1 spray every morning.     calcium-vitamin D (OSCAL WITH D) 500-200 MG-UNIT TABS tablet TAKE 1 TABLET BY MOUTH EVERY DAY WITH BREAKFAST 90 tablet 1   Cholecalciferol (VITAMIN D-3 PO) Take 1 capsule by mouth daily.      cyclobenzaprine (FLEXERIL) 5 MG tablet Take 1 tablet (5 mg total) by mouth 3 (three) times daily as needed for muscle spasms. 30 tablet 1   denosumab (PROLIA) 60 MG/ML SOSY injection Bring to your doctor to inject 60 mg subcutaneously. 1 mL 3   diclofenac Sodium (VOLTAREN) 1 % GEL APPLY 4 GRAMS TOPICALLY 4 (FOUR) TIMES DAILY. 400 g 1   diphenhydrAMINE (BENADRYL) 25 MG tablet Take 50 mg by mouth at bedtime as needed for allergies.      Ferrous Sulfate (IRON) 325 (65 Fe) MG TABS Take 1 tablet by mouth 3 (three) times daily.     fluconazole (DIFLUCAN) 150 MG tablet Take 1 tablet (150 mg total) by mouth every three (3) days as needed. 3 tablet 0   gabapentin (NEURONTIN) 300 MG capsule Take 300 mg by mouth 3 (three) times daily.     ketoconazole (NIZORAL) 2 % cream APPLY TO AFFECTED AREA EVERYDAY 60 g 2   levothyroxine (SYNTHROID) 100 MCG tablet Take 1 tablet (100 mcg total) by mouth daily before breakfast. 90 tablet 3   lidocaine (LIDODERM) 5 % PLACE 1 PATCH ONTO THE SKIN DAILY. REMOVE & DISCARD PATCH WITHIN 12 HOURS OR AS DIRECTED BY MD 30 patch 0   lisinopril (ZESTRIL) 20 MG tablet Take 1 tablet (20 mg  total) by mouth daily. 90 tablet 3   lovastatin (MEVACOR) 40 MG tablet Take 1 tablet (40 mg total) by mouth at bedtime. (NEEDS TO BE SEEN BEFORE NEXT REFILL) 30 tablet 0   metoprolol succinate (TOPROL-XL) 25 MG 24 hr tablet Take 1 tablet (25 mg total) by mouth daily. 90 tablet 3   mirabegron ER (MYRBETRIQ) 25 MG TB24 tablet Take 1 tablet (25 mg total) by mouth daily. 30 tablet 3   Multiple Vitamin (MULTI-VITAMIN) tablet Take by mouth.  Multiple Vitamins-Minerals (PRESERVISION AREDS 2 PO) Take by mouth.     nitrofurantoin, macrocrystal-monohydrate, (MACROBID) 100 MG capsule Take 100 mg by mouth daily.     nystatin (MYCOSTATIN/NYSTOP) powder Apply topically 4 (four) times daily. 60 g 1   omeprazole (PRILOSEC) 20 MG capsule Take 1 capsule (20 mg total) by mouth 2 (two) times daily before a meal. 180 capsule 1   PROLIA 60 MG/ML SOSY injection TO BE ADMINISTERED IN PHYSICIAN'S OFFICE. INJECT ONE SYRINGE SUBCOUSLY ONCE EVERY 6 MONTHS. REFRIGERATE. USE WITHIN 14 DAYS ONCE AT ROOM TEMPERATURE. 1 mL 1   pyridOXINE (VITAMIN B-6) 100 MG tablet Take 100 mg by mouth daily.     sertraline (ZOLOFT) 100 MG tablet Take 2 tablets (200 mg total) by mouth daily. 180 tablet 3   sodium chloride (OCEAN) 0.65 % SOLN nasal spray Place 1 spray into both nostrils as needed for congestion. 1 Bottle prn   celecoxib (CELEBREX) 100 MG capsule Take 1 capsule (100 mg total) by mouth 2 (two) times daily. 60 capsule 3   No facility-administered medications prior to visit.    Allergies  Allergen Reactions   Crestor [Rosuvastatin] Other (See Comments)    Body aches.    Nsaids     Burns stomach.    Vytorin [Ezetimibe-Simvastatin]     Body aches.    Aspirin Nausea Only   Tolmetin Nausea Only    Burns stomach.     Review of Systems  Constitutional: Negative.   HENT: Negative.    Eyes: Negative.   Respiratory: Negative.    Gastrointestinal:  Positive for abdominal pain. Negative for constipation, diarrhea, nausea and  vomiting.  Musculoskeletal: Negative.   Skin:  Negative for rash.  Psychiatric/Behavioral: Negative.    All other systems reviewed and are negative.     Objective:    Physical Exam Vitals and nursing note reviewed.  Constitutional:      Appearance: Normal appearance. She is obese.  HENT:     Right Ear: External ear normal.     Left Ear: External ear normal.  Eyes:     Conjunctiva/sclera: Conjunctivae normal.  Abdominal:     General: Bowel sounds are normal.     Tenderness: There is abdominal tenderness.  Skin:    General: Skin is warm.     Findings: No rash.  Neurological:     Mental Status: She is alert and oriented to person, place, and time.  Psychiatric:        Behavior: Behavior normal.    BP 121/66    Pulse (!) 58    Temp 98.6 F (37 C)    Ht '5\' 3"'  (1.6 m)    Wt 167 lb (75.8 kg)    SpO2 92%    BMI 29.58 kg/m  Wt Readings from Last 3 Encounters:  02/01/22 167 lb (75.8 kg)  12/14/21 167 lb (75.8 kg)  10/28/21 176 lb 6.4 oz (80 kg)    Health Maintenance Due  Topic Date Due   HEMOGLOBIN A1C  10/21/2021    There are no preventive care reminders to display for this patient.   Lab Results  Component Value Date   TSH 0.978 07/29/2021   Lab Results  Component Value Date   WBC 4.1 12/30/2021   HGB 13.1 12/30/2021   HCT 39.6 12/30/2021   MCV 92 12/30/2021   PLT 238 12/30/2021   Lab Results  Component Value Date   NA 139 12/30/2021   K 5.3 (H) 12/30/2021   CO2  30 (H) 12/30/2021   GLUCOSE 104 (H) 12/30/2021   BUN 10 12/30/2021   CREATININE 0.68 12/30/2021   BILITOT 0.5 12/30/2021   ALKPHOS 112 12/30/2021   AST 14 12/30/2021   ALT 11 12/30/2021   PROT 6.7 12/30/2021   ALBUMIN 4.7 (H) 12/30/2021   CALCIUM 11.1 (H) 12/30/2021   ANIONGAP 11 10/17/2019   EGFR 87 12/30/2021   Lab Results  Component Value Date   CHOL 173 04/21/2021   Lab Results  Component Value Date   HDL 49 04/21/2021   Lab Results  Component Value Date   LDLCALC 91  04/21/2021   Lab Results  Component Value Date   TRIG 191 (H) 04/21/2021   Lab Results  Component Value Date   CHOLHDL 3.5 04/21/2021   Lab Results  Component Value Date   HGBA1C 6.1 04/21/2021       Assessment & Plan:   Problem List Items Addressed This Visit       Other   Breast pain, left - Primary    Patient continues to have breast pain her left breast, no nodules palpated . Will complete diagnostic mammogram.       Relevant Orders   MM Digital Diagnostic Unilat L   Generalized abdominal pain    Patient has had this abdominal pain for 3 years. Patient wants a GI referral and no treatment for now.  Completed referral to GI       Relevant Orders   Ambulatory referral to Gastroenterology     No orders of the defined types were placed in this encounter.    Ivy Lynn, NP

## 2022-02-01 NOTE — Assessment & Plan Note (Signed)
Patient continues to have breast pain her left breast, no nodules palpated . Will complete diagnostic mammogram.

## 2022-02-01 NOTE — Patient Instructions (Signed)
Breast Tenderness Breast tenderness is a common problem for women of all ages, but may also occur in men. Breast tenderness may range from mild discomfort to severe pain. In women, the pain usually comes and goes with the menstrual cycle, but it can also be constant. Breast tenderness has many possible causes, including hormone changes, infections, and taking certain medicines. You may have tests, such as a mammogram or an ultrasound, to check for any unusual findings. Having breast tenderness usually does not mean that you have breast cancer. Follow these instructions at home: Managing pain and discomfort  If directed, put ice to the painful area. To do this: Put ice in a plastic bag. Place a towel between your skin and the bag. Leave the ice on for 20 minutes, 2-3 times a day. Wear a supportive bra, especially during exercise. You may also want to wear a supportive bra while sleeping if your breasts are very tender. Medicines Take over-the-counter and prescription medicines only as told by your health care provider. If the cause of your pain is infection, you may be prescribed an antibiotic medicine. If you were prescribed an antibiotic, take it as told by your health care provider. Do not stop taking the antibiotic even if you start to feel better. Eating and drinking Your health care provider may recommend that you lessen the amount of fat in your diet. You can do this by: Limiting fried foods. Cooking foods using methods such as baking, boiling, grilling, and broiling. Decrease the amount of caffeine in your diet. Instead, drink more water and choose caffeine-free drinks. General instructions  Keep a log of the days and times when your breasts are most tender. Ask your health care provider how to do breast exams at home. This will help you notice if you have an unusual growth or lump. Keep all follow-up visits as told by your health care provider. This is important. Contact a health care  provider if: Any part of your breast is hard, red, and hot to the touch. This may be a sign of infection. You are a woman and: Not breastfeeding and you have fluid, especially blood or pus, coming out of your nipples. Have a new or painful lump in your breast that remains after your menstrual period ends. You have a fever. Your pain does not improve or it gets worse. Your pain is interfering with your daily activities. Summary Breast tenderness may range from mild discomfort to severe pain. Breast tenderness has many possible causes, including hormone changes, infections, and taking certain medicines. It can be treated with ice, wearing a supportive bra, and medicines. Make changes to your diet if told to by your health care provider. This information is not intended to replace advice given to you by your health care provider. Make sure you discuss any questions you have with your health care provider. Document Revised: 05/05/2019 Document Reviewed: 05/05/2019 Elsevier Patient Education  2022 [employer]  

## 2022-02-06 ENCOUNTER — Other Ambulatory Visit: Payer: Self-pay | Admitting: Nurse Practitioner

## 2022-02-06 DIAGNOSIS — N644 Mastodynia: Secondary | ICD-10-CM

## 2022-02-13 DIAGNOSIS — R1013 Epigastric pain: Secondary | ICD-10-CM | POA: Diagnosis not present

## 2022-02-13 DIAGNOSIS — R1084 Generalized abdominal pain: Secondary | ICD-10-CM | POA: Diagnosis not present

## 2022-02-17 DIAGNOSIS — R0902 Hypoxemia: Secondary | ICD-10-CM | POA: Diagnosis not present

## 2022-02-22 ENCOUNTER — Other Ambulatory Visit: Payer: Self-pay | Admitting: Family Medicine

## 2022-02-22 NOTE — Telephone Encounter (Signed)
Last office visit 12/21/21 ?On med list but does not show prescribed from our office ?

## 2022-02-23 NOTE — Telephone Encounter (Signed)
We have not prescribed it in over a year, I do not know if she is still been taking it or what dose she has been taking it or where she has been getting it from.  There is a question 20 to ask before we can discuss prescribing it. ?

## 2022-02-26 NOTE — Telephone Encounter (Signed)
Last office visit 12/14/21 ?On med list but not prescribed from our office before ?

## 2022-03-01 DIAGNOSIS — R109 Unspecified abdominal pain: Secondary | ICD-10-CM | POA: Diagnosis not present

## 2022-03-01 DIAGNOSIS — R1084 Generalized abdominal pain: Secondary | ICD-10-CM | POA: Diagnosis not present

## 2022-03-01 DIAGNOSIS — I700 Atherosclerosis of aorta: Secondary | ICD-10-CM | POA: Diagnosis not present

## 2022-03-01 DIAGNOSIS — R1013 Epigastric pain: Secondary | ICD-10-CM | POA: Diagnosis not present

## 2022-03-01 DIAGNOSIS — K5730 Diverticulosis of large intestine without perforation or abscess without bleeding: Secondary | ICD-10-CM | POA: Diagnosis not present

## 2022-03-03 ENCOUNTER — Ambulatory Visit
Admission: RE | Admit: 2022-03-03 | Discharge: 2022-03-03 | Disposition: A | Discharge status: Home / Self Care | Payer: Medicare Other | Source: Ambulatory Visit | Attending: Nurse Practitioner | Admitting: Nurse Practitioner

## 2022-03-03 ENCOUNTER — Ambulatory Visit: Payer: Medicare Other

## 2022-03-03 DIAGNOSIS — R922 Inconclusive mammogram: Secondary | ICD-10-CM | POA: Diagnosis not present

## 2022-03-03 DIAGNOSIS — N644 Mastodynia: Secondary | ICD-10-CM | POA: Diagnosis not present

## 2022-03-13 ENCOUNTER — Other Ambulatory Visit: Payer: Self-pay | Admitting: Family Medicine

## 2022-03-13 DIAGNOSIS — K219 Gastro-esophageal reflux disease without esophagitis: Secondary | ICD-10-CM

## 2022-03-13 DIAGNOSIS — M545 Low back pain, unspecified: Secondary | ICD-10-CM

## 2022-03-13 NOTE — Telephone Encounter (Signed)
Last office visit 12/14/21 ?Last refill Lidocaine patch 01/02/22, #30, no refills ?

## 2022-03-16 DIAGNOSIS — R1013 Epigastric pain: Secondary | ICD-10-CM | POA: Diagnosis not present

## 2022-03-16 DIAGNOSIS — R1084 Generalized abdominal pain: Secondary | ICD-10-CM | POA: Diagnosis not present

## 2022-03-16 DIAGNOSIS — H5213 Myopia, bilateral: Secondary | ICD-10-CM | POA: Diagnosis not present

## 2022-03-17 DIAGNOSIS — R0902 Hypoxemia: Secondary | ICD-10-CM | POA: Diagnosis not present

## 2022-03-23 DIAGNOSIS — E039 Hypothyroidism, unspecified: Secondary | ICD-10-CM | POA: Diagnosis not present

## 2022-03-23 DIAGNOSIS — Z791 Long term (current) use of non-steroidal anti-inflammatories (NSAID): Secondary | ICD-10-CM | POA: Diagnosis not present

## 2022-03-23 DIAGNOSIS — E119 Type 2 diabetes mellitus without complications: Secondary | ICD-10-CM | POA: Diagnosis not present

## 2022-03-23 DIAGNOSIS — Z9981 Dependence on supplemental oxygen: Secondary | ICD-10-CM | POA: Diagnosis not present

## 2022-03-23 DIAGNOSIS — K219 Gastro-esophageal reflux disease without esophagitis: Secondary | ICD-10-CM | POA: Diagnosis not present

## 2022-03-23 DIAGNOSIS — G4733 Obstructive sleep apnea (adult) (pediatric): Secondary | ICD-10-CM | POA: Diagnosis not present

## 2022-03-23 DIAGNOSIS — R1013 Epigastric pain: Secondary | ICD-10-CM | POA: Diagnosis not present

## 2022-03-23 DIAGNOSIS — I110 Hypertensive heart disease with heart failure: Secondary | ICD-10-CM | POA: Diagnosis not present

## 2022-03-23 DIAGNOSIS — I509 Heart failure, unspecified: Secondary | ICD-10-CM | POA: Diagnosis not present

## 2022-03-23 DIAGNOSIS — E559 Vitamin D deficiency, unspecified: Secondary | ICD-10-CM | POA: Diagnosis not present

## 2022-03-23 DIAGNOSIS — E785 Hyperlipidemia, unspecified: Secondary | ICD-10-CM | POA: Diagnosis not present

## 2022-03-23 DIAGNOSIS — M1990 Unspecified osteoarthritis, unspecified site: Secondary | ICD-10-CM | POA: Diagnosis not present

## 2022-03-23 DIAGNOSIS — Z79899 Other long term (current) drug therapy: Secondary | ICD-10-CM | POA: Diagnosis not present

## 2022-03-23 DIAGNOSIS — K2950 Unspecified chronic gastritis without bleeding: Secondary | ICD-10-CM | POA: Diagnosis not present

## 2022-03-29 ENCOUNTER — Ambulatory Visit (INDEPENDENT_AMBULATORY_CARE_PROVIDER_SITE_OTHER): Payer: Medicare Other | Admitting: Nurse Practitioner

## 2022-03-29 ENCOUNTER — Encounter: Payer: Self-pay | Admitting: Nurse Practitioner

## 2022-03-29 VITALS — BP 126/62 | HR 59 | Temp 98.7°F | Ht 63.0 in | Wt 165.0 lb

## 2022-03-29 DIAGNOSIS — H1033 Unspecified acute conjunctivitis, bilateral: Secondary | ICD-10-CM

## 2022-03-29 MED ORDER — BACITRACIN-POLYMYXIN B 500-10000 UNIT/GM OP OINT: 1.0000 "application " | TOPICAL_OINTMENT | Freq: Two times a day (BID) | OPHTHALMIC | 1 refills | Status: DC

## 2022-03-29 NOTE — Patient Instructions (Signed)
Bacterial Conjunctivitis, Adult ?Bacterial conjunctivitis is an infection of the clear membrane that covers the white part of the eye and the inner surface of the eyelid (conjunctiva). When the blood vessels in the conjunctiva become inflamed, the eye becomes red or pink. The eye often feels irritated or itchy. Bacterial conjunctivitis spreads easily from person to person (is contagious). It also spreads easily from one eye to the other eye. ?What are the causes? ?This condition is caused by bacteria. You may get the infection if you come into close contact with: ?A person who is infected with the bacteria. ?Items that are contaminated with the bacteria, such as a face towel, contact lens solution, or eye makeup. ?What increases the risk? ?You are more likely to develop this condition if: ?You are exposed to other people who have the infection. ?You wear contact lenses. ?You have a sinus infection. ?You have had a recent eye injury or surgery. ?You have a weak body defense system (immune system). ?You have a medical condition that causes dry eyes. ?What are the signs or symptoms? ?Symptoms of this condition include: ?Thick, yellowish discharge from the eye. This may turn into a crust on the eyelid overnight and cause your eyelids to stick together. ?Tearing or watery eyes. ?Itchy eyes. ?Burning feeling in your eyes. ?Eye redness. ?Swollen eyelids. ?Blurred vision. ?How is this diagnosed? ?This condition is diagnosed based on your symptoms and medical history. Your health care provider may also take a sample of discharge from your eye to find the cause of your infection. ?How is this treated? ?This condition may be treated with: ?Antibiotic eye drops or ointment to clear the infection more quickly and prevent the spread of infection to others. ?Antibiotic medicines taken by mouth (orally) to treat infections that do not respond to drops or ointments or that last longer than 10 days. ?Cool, wet cloths (cool  compresses) placed on the eyes. ?Artificial tears applied 2-6 times a day. ?Follow these instructions at home: ?Medicines ?Take or apply your antibiotic medicine as told by your health care provider. Do not stop using the antibiotic, even if your condition improves, unless directed by your health care provider. ?Take or apply over-the-counter and prescription medicines only as told by your health care provider. ?Be very careful to avoid touching the edge of your eyelid with the eye-drop bottle or the ointment tube when you apply medicines to the affected eye. This will keep you from spreading the infection to your other eye or to other people. ?Managing discomfort ?Gently wipe away any drainage from your eye with a warm, wet washcloth or a cotton ball. ?Apply a clean, cool compress to your eye for 10-20 minutes, 3-4 times a day. ?General instructions ?Do not wear contact lenses until the inflammation is gone and your health care provider says it is safe to wear them again. Ask your health care provider how to sterilize or replace your contact lenses before you use them again. Wear glasses until you can resume wearing contact lenses. ?Avoid wearing eye makeup until the inflammation is gone. Throw away any old eye cosmetics that may be contaminated. ?Change or wash your pillowcase every day. ?Do not share towels or washcloths. This may spread the infection. ?Wash your hands often with soap and water for at least 20 seconds and especially before touching your face or eyes. Use paper towels to dry your hands. ?Avoid touching or rubbing your eyes. ?Do not drive or use heavy machinery if your vision is blurred. ?Contact   a health care provider if: ?You have a fever. ?Your symptoms do not get better after 10 days. ?Get help right away if: ?You have a fever and your symptoms suddenly get worse. ?You have severe pain when you move your eye. ?You have facial pain, redness, or swelling. ?You have a sudden loss of  vision. ?Summary ?Bacterial conjunctivitis is an infection of the clear membrane that covers the white part of the eye and the inner surface of the eyelid (conjunctiva). ?Bacterial conjunctivitis spreads easily from eye to eye and from person to person (is contagious). ?Wash your hands often with soap and water for at least 20 seconds and especially before touching your face or eyes. Use paper towels to dry your hands. ?Take or apply your antibiotic medicine as told by your health care provider. Do not stop using the antibiotic even if your condition improves. ?Contact a health care provider if you have a fever or if your symptoms do not get better after 10 days. Get help right away if you have a sudden loss of vision. ?This information is not intended to replace advice given to you by your health care provider. Make sure you discuss any questions you have with your health care provider. ?Document Revised: 03/23/2021 Document Reviewed: 03/23/2021 ?Elsevier Patient Education ? [place]. ? ?

## 2022-03-29 NOTE — Progress Notes (Signed)
I ? ?Acute Office Visit ? ?Subjective:  ? ? Patient ID: Danielle Parrish, female    DOB: 1941-02-29, 81 y.o.   MRN: [id number] ? ?Chief Complaint  ?Patient presents with  ? Conjunctivitis  ? ? ?Conjunctivitis  ?The current episode started yesterday. The onset was sudden. The problem has been unchanged. The problem is mild. Nothing relieves the symptoms. Nothing aggravates the symptoms. Associated symptoms include eye itching, eye discharge and eye redness. Pertinent negatives include no decreased vision, no double vision, no photophobia and no rash.  ? ?Past Medical History:  ?Diagnosis Date  ? Anxiety   ? Arthritis   ? CHF (congestive heart failure) ([place])   ? Chronic pain of multiple joints   ? Depression   ? Essential hypertension   ? GERD (gastroesophageal reflux disease)   ? Hyperlipidemia   ? Hyperlipidemia associated with type 2 diabetes mellitus ([place])   ? Hypertension associated with type 2 diabetes mellitus ([place])   ? Hypothyroidism   ? Mild scoliosis 10/19/2021  ? Osteoporosis   ? Sleep apnea   ? CPAP  ? Type 2 diabetes mellitus with hypercholesterolemia ([place])   ? Vitamin D deficiency   ? ? ?Past Surgical History:  ?Procedure Laterality Date  ? ABDOMINAL HERNIA REPAIR    ? ABDOMINAL HYSTERECTOMY  1977  ? BREAST EXCISIONAL BIOPSY Right   ? EYE SURGERY  2020  ? bilateral cataract removal   ? KNEE ARTHROSCOPY Left 09/30/2014  ? Procedure: LEFT KNEE ARTHROSCOPY MEDIAL MENISECTOMY ABRASION CHONDROPLASTY SYNOVECTOMY SUPRAPATELLER CUFF;  Surgeon: Tobi Bastos, MD;  Location: [facility];  Service: Orthopedics;  Laterality: Left;  ? ? ?Family History  ?Problem Relation Age of Onset  ? Hypertension Mother   ? Stroke Mother   ? Cancer Father   ? ? ?Social History  ? ?Socioeconomic History  ? Marital status: Widowed  ?  Spouse name: Laverna Peace  ? Number of children: 2  ? Years of education: Not on file  ? Highest education level: High school graduate  ?Occupational History  ? Occupation: CNA  ?  Comment: [employer]  ?  Occupation: retired  ?Tobacco Use  ? Smoking status: Never  ? Smokeless tobacco: Never  ?Vaping Use  ? Vaping Use: Never used  ?Substance and Sexual Activity  ? Alcohol use: No  ? Drug use: No  ? Sexual activity: Not Currently  ?Other Topics Concern  ? Not on file  ?Social History Narrative  ? Lives with her daughter  ? ?Social Determinants of Health  ? ?Financial Resource Strain: Low Risk   ? Difficulty of Paying Living Expenses: Not very hard  ?Food Insecurity: No Food Insecurity  ? Worried About Charity fundraiser in the Last Year: Never true  ? Ran Out of Food in the Last Year: Never true  ?Transportation Needs: No Transportation Needs  ? Lack of Transportation (Medical): No  ? Lack of Transportation (Non-Medical): No  ?Physical Activity: Insufficiently Active  ? Days of Exercise per Week: 3 days  ? Minutes of Exercise per Session: 20 min  ?Stress: No Stress Concern Present  ? Feeling of Stress : Not at all  ?Social Connections: Socially Isolated  ? Frequency of Communication with Friends and Family: More than three times a week  ? Frequency of Social Gatherings with Friends and Family: More than three times a week  ? Attends Religious Services: Never  ? Active Member of Clubs or Organizations: No  ? Attends Archivist Meetings:  Never  ? Marital Status: Widowed  ?Intimate Partner Violence: Not At Risk  ? Fear of Current or Ex-Partner: No  ? Emotionally Abused: No  ? Physically Abused: No  ? Sexually Abused: No  ? ? ?Outpatient Medications Prior to Visit  ?Medication Sig Dispense Refill  ? acetaminophen (TYLENOL) 500 MG tablet Take 1 tablet (500 mg total) by mouth every 6 (six) hours as needed. 30 tablet 0  ? albuterol (VENTOLIN HFA) 108 (90 Base) MCG/ACT inhaler TAKE 2 PUFFS BY MOUTH EVERY 6 HOURS AS NEEDED FOR WHEEZE OR SHORTNESS OF BREATH 6.7 each 3  ? amLODipine (NORVASC) 10 MG tablet Take 1 tablet (10 mg total) by mouth daily. 90 tablet 3  ? amoxicillin (AMOXIL) 500 MG capsule Take 500 mg by mouth  4 (four) times daily.    ? calcitonin, salmon, (MIACALCIN/FORTICAL) 200 UNIT/ACT nasal spray 1 spray every morning.    ? calcium-vitamin D (OSCAL WITH D) 500-200 MG-UNIT TABS tablet TAKE 1 TABLET BY MOUTH EVERY DAY WITH BREAKFAST 90 tablet 1  ? Cholecalciferol (VITAMIN D-3 PO) Take 1 capsule by mouth daily.     ? cyclobenzaprine (FLEXERIL) 5 MG tablet Take 1 tablet (5 mg total) by mouth 3 (three) times daily as needed for muscle spasms. 30 tablet 1  ? denosumab (PROLIA) 60 MG/ML SOSY injection Bring to your doctor to inject 60 mg subcutaneously. 1 mL 3  ? diclofenac Sodium (VOLTAREN) 1 % GEL APPLY 4 GRAMS TOPICALLY 4 (FOUR) TIMES DAILY. 400 g 1  ? diphenhydrAMINE (BENADRYL) 25 MG tablet Take 50 mg by mouth at bedtime as needed for allergies.     ? Ferrous Sulfate (IRON) 325 (65 Fe) MG TABS Take 1 tablet by mouth 3 (three) times daily.    ? fluconazole (DIFLUCAN) 150 MG tablet Take 1 tablet (150 mg total) by mouth every three (3) days as needed. 3 tablet 0  ? gabapentin (NEURONTIN) 300 MG capsule TAKE 1 CAPSULE BY MOUTH 3 TIMES DAILY 90 capsule 1  ? ketoconazole (NIZORAL) 2 % cream APPLY TO AFFECTED AREA EVERYDAY 60 g 2  ? levothyroxine (SYNTHROID) 100 MCG tablet Take 1 tablet (100 mcg total) by mouth daily before breakfast. 90 tablet 3  ? lidocaine (LIDODERM) 5 % PLACE 1 PATCH ONTO THE SKIN DAILY. REMOVE & DISCARD PATCH WITHIN 12 HOURS OR AS DIRECTED BY MD 30 patch 0  ? lisinopril (ZESTRIL) 20 MG tablet Take 1 tablet (20 mg total) by mouth daily. 90 tablet 3  ? lovastatin (MEVACOR) 40 MG tablet Take 1 tablet (40 mg total) by mouth at bedtime. (NEEDS TO BE SEEN BEFORE NEXT REFILL) 30 tablet 0  ? metoprolol succinate (TOPROL-XL) 25 MG 24 hr tablet Take 1 tablet (25 mg total) by mouth daily. 90 tablet 3  ? mirabegron ER (MYRBETRIQ) 25 MG TB24 tablet Take 1 tablet (25 mg total) by mouth daily. 30 tablet 3  ? Multiple Vitamin (MULTI-VITAMIN) tablet Take by mouth.    ? Multiple Vitamins-Minerals (PRESERVISION AREDS 2 PO)  Take by mouth.    ? nitrofurantoin, macrocrystal-monohydrate, (MACROBID) 100 MG capsule Take 100 mg by mouth daily.    ? nystatin (MYCOSTATIN/NYSTOP) powder Apply topically 4 (four) times daily. 60 g 1  ? omeprazole (PRILOSEC) 20 MG capsule TAKE 1 CAPSULE (20 MG TOTAL) BY MOUTH 2 (TWO) TIMES DAILY BEFORE A MEAL. 180 capsule 0  ? PROLIA 60 MG/ML SOSY injection TO BE ADMINISTERED IN PHYSICIAN'S OFFICE. INJECT ONE SYRINGE SUBCOUSLY ONCE EVERY 6 MONTHS. REFRIGERATE. USE WITHIN  14 DAYS ONCE AT ROOM TEMPERATURE. 1 mL 1  ? pyridOXINE (VITAMIN B-6) 100 MG tablet Take 100 mg by mouth daily.    ? sertraline (ZOLOFT) 100 MG tablet Take 2 tablets (200 mg total) by mouth daily. 180 tablet 3  ? sodium chloride (OCEAN) 0.65 % SOLN nasal spray Place 1 spray into both nostrils as needed for congestion. 1 Bottle prn  ? ?No facility-administered medications prior to visit.  ? ? ?Allergies  ?Allergen Reactions  ? Crestor [Rosuvastatin] Other (See Comments)  ?  Body aches.   ? Nsaids   ?  Burns stomach.   ? Vytorin [Ezetimibe-Simvastatin]   ?  Body aches.   ? Aspirin Nausea Only  ? Tolmetin Nausea Only  ?  Burns stomach.   ? ? ?Review of Systems  ?Constitutional: Negative.   ?HENT: Negative.    ?Eyes:  Positive for discharge, redness and itching. Negative for double vision and photophobia.  ?Respiratory: Negative.    ?Gastrointestinal: Negative.   ?Skin:  Negative for rash.  ?All other systems reviewed and are negative. ? ?   ?Objective:  ?  ?Physical Exam ?Vitals and nursing note reviewed.  ?Constitutional:   ?   Appearance: Normal appearance.  ?HENT:  ?   Head: Normocephalic.  ?   Right Ear: External ear normal.  ?   Left Ear: External ear normal.  ?   Nose: Nose normal. No congestion.  ?   Mouth/Throat:  ?   Mouth: Mucous membranes are moist.  ?   Pharynx: Oropharynx is clear.  ?Eyes:  ?   General:     ?   Right eye: Discharge present.     ?   Left eye: Discharge present. ?Cardiovascular:  ?   Rate and Rhythm: Normal rate and regular  rhythm.  ?   Pulses: Normal pulses.  ?   Heart sounds: Normal heart sounds.  ?Pulmonary:  ?   Effort: Pulmonary effort is normal.  ?   Breath sounds: Normal breath sounds.  ?Abdominal:  ?   General: [place]

## 2022-04-07 ENCOUNTER — Other Ambulatory Visit: Payer: Self-pay | Admitting: Nurse Practitioner

## 2022-04-07 ENCOUNTER — Telehealth: Payer: Self-pay | Admitting: Nurse Practitioner

## 2022-04-07 DIAGNOSIS — H1033 Unspecified acute conjunctivitis, bilateral: Secondary | ICD-10-CM

## 2022-04-07 MED ORDER — ERYTHROMYCIN 5 MG/GM OP OINT: 1.0000 "application " | TOPICAL_OINTMENT | Freq: Four times a day (QID) | OPHTHALMIC | 1 refills | Status: DC

## 2022-04-07 NOTE — Telephone Encounter (Signed)
Pt called stating that she had an appt with Onyeje on 03/29/22 for pink eye and was prescribed some eye drops. ? ?Pt says she has used all of the drops and is still have the same issues with her eyes. ? ?Wants to know if refill can be sent in on the eye drops of if something else can be sent in? ?

## 2022-04-10 ENCOUNTER — Other Ambulatory Visit: Payer: Self-pay | Admitting: Family Medicine

## 2022-04-10 DIAGNOSIS — B372 Candidiasis of skin and nail: Secondary | ICD-10-CM

## 2022-04-17 DIAGNOSIS — R0902 Hypoxemia: Secondary | ICD-10-CM | POA: Diagnosis not present

## 2022-04-19 ENCOUNTER — Other Ambulatory Visit: Payer: Self-pay | Admitting: Family Medicine

## 2022-04-19 DIAGNOSIS — B3749 Other urogenital candidiasis: Secondary | ICD-10-CM

## 2022-04-20 DIAGNOSIS — S32040G Wedge compression fracture of fourth lumbar vertebra, subsequent encounter for fracture with delayed healing: Secondary | ICD-10-CM | POA: Diagnosis not present

## 2022-04-20 DIAGNOSIS — M8008XG Age-related osteoporosis with current pathological fracture, vertebra(e), subsequent encounter for fracture with delayed healing: Secondary | ICD-10-CM | POA: Diagnosis not present

## 2022-04-20 DIAGNOSIS — S32010G Wedge compression fracture of first lumbar vertebra, subsequent encounter for fracture with delayed healing: Secondary | ICD-10-CM | POA: Diagnosis not present

## 2022-04-20 DIAGNOSIS — M5440 Lumbago with sciatica, unspecified side: Secondary | ICD-10-CM | POA: Diagnosis not present

## 2022-04-20 DIAGNOSIS — M549 Dorsalgia, unspecified: Secondary | ICD-10-CM | POA: Diagnosis not present

## 2022-04-27 DIAGNOSIS — H52222 Regular astigmatism, left eye: Secondary | ICD-10-CM | POA: Diagnosis not present

## 2022-04-27 DIAGNOSIS — H5203 Hypermetropia, bilateral: Secondary | ICD-10-CM | POA: Diagnosis not present

## 2022-04-28 ENCOUNTER — Other Ambulatory Visit: Payer: Self-pay | Admitting: Family Medicine

## 2022-04-28 DIAGNOSIS — F339 Major depressive disorder, recurrent, unspecified: Secondary | ICD-10-CM

## 2022-05-17 ENCOUNTER — Telehealth: Payer: Self-pay | Admitting: Adult Health

## 2022-05-17 DIAGNOSIS — R0902 Hypoxemia: Secondary | ICD-10-CM | POA: Diagnosis not present

## 2022-05-17 NOTE — Telephone Encounter (Signed)
Pt called wanting to speak to someone to see if she is a candidate for the Inspire. Please advise.

## 2022-05-17 NOTE — Telephone Encounter (Signed)
Spoke with pt. Scheduled her for an appt with Dr Rexene Alberts on 5/30 @ 8:45 AM arrival 8:15 AM. Pt to bring her machine & power cord. She will let us know tomorrow if for some reason she cannot get a ride.

## 2022-05-17 NOTE — Telephone Encounter (Signed)
You can schedule a FU with me to discuss inspire. Please advise patient that she will need a new sleep study to determine if she is a candidate for the surgery. We can talk about it during the appointment.

## 2022-05-23 ENCOUNTER — Ambulatory Visit (INDEPENDENT_AMBULATORY_CARE_PROVIDER_SITE_OTHER): Payer: Medicare Other | Admitting: Neurology

## 2022-05-23 ENCOUNTER — Telehealth: Payer: Self-pay | Admitting: *Deleted

## 2022-05-23 VITALS — BP 153/75 | HR 56 | Ht 62.0 in | Wt 168.6 lb

## 2022-05-23 DIAGNOSIS — R351 Nocturia: Secondary | ICD-10-CM

## 2022-05-23 DIAGNOSIS — Z789 Other specified health status: Secondary | ICD-10-CM | POA: Diagnosis not present

## 2022-05-23 DIAGNOSIS — G4733 Obstructive sleep apnea (adult) (pediatric): Secondary | ICD-10-CM | POA: Diagnosis not present

## 2022-05-23 DIAGNOSIS — E669 Obesity, unspecified: Secondary | ICD-10-CM | POA: Diagnosis not present

## 2022-05-23 DIAGNOSIS — E66811 Obesity, class 1: Secondary | ICD-10-CM

## 2022-05-23 DIAGNOSIS — Z9981 Dependence on supplemental oxygen: Secondary | ICD-10-CM

## 2022-05-23 NOTE — Progress Notes (Signed)
Subjective:    Patient ID: Danielle Parrish is a 81 y.o. female.  HPI    Interim history:   Danielle Parrish is an18 year old right-handed woman with an underlying medical history of hypertension, hyperlipidemia, reflux disease, depression, anxiety, congestive heart failure, hypothyroidism, osteoporosis, recurrent falls, history of right shoulder reverse arthroplasty on 08/17/2021 and right distal radius fracture, lumbar compression fractures, thoracic compression fractures, status post lumbar vertebroplasty, vitamin D deficiency, diabetes and obesity, who presents for follow-up consultation of her obstructive sleep apnea, on CPAP therapy.  She would like to discuss alternative treatment options including inspire.  The patient is unaccompanied today.  I first met her on 01/01/2020 at the request of her primary care, at which time she was on CPAP therapy.  She had received a new machine in 2017.  Original sleep testing was in 2004 with a baseline AHI of 103.8/h, O2 nadir 83%.  She had a split-night sleep study.  She has had difficulty using her CPAP.  She saw Debbora Presto, NP, and a virtual visit on 07/01/2020, at which time she was compliant with her machine.  She saw Ward Givens, NP on 10/25/2021, at which time she was compliant with treatment until late October 2022 and she stopped using her machine at the time, she was using nocturnal oxygen.  Oxygen was prescribed by PCP in October 2022.  Today, 05/23/2022: I reviewed her CPAP compliance data, she used her CPAP last in the beginning of November 2022. She reports that she does not like to use the oxygen, she would rather use the CPAP.  She has not been able to use both together.  She uses nasal pillows, could not tolerate a mask.  We talked about the inspire, the type of surgical procedure involved, drug-induced sleep endoscopy procedure, and inspire titration.  We also talked about a dental device but she has had trouble with her teeth and gums.  She needs  2 teeth extracted but has not found an oral surgeon that is comfortable doing the extraction as she was on Prolia and she has gum disease.  She has not had Prolia in about a year.  Previously:   01/01/20: (She) was previously diagnosed with obstructive sleep apnea and placed on CPAP therapy.  She had sleep study testing in 2004, I was able to review her split-night sleep study from 10/14/2003 which indicated severe obstructive sleep apnea with a baseline AHI of 103.8, O2 nadir of 83%.  She was advised to start CPAP therapy at 13 cm.  A CPAP compliance download was reviewed today from 12/03/2019 through 01/01/2020, which is a total of 30 days, during which time she used her machine only 15 days with percent use days greater than 4 hours at 43%, indicating suboptimal compliance with an average usage for days on treatment of 7 hours and 44 minutes, residual AHI at goal at 3.3/h, but leak high with a 95th percentile at 75.7 L/min on a pressure of 13 cm with EPR of 3. I reviewed your telemedicine note from 07/15/2019.  Her Epworth sleepiness score is 8 out of 24, fatigue severity score is 45 out of 63.  Her daughter lives with her, patient has another daughter in [place], [place].  She is a non-smoker and does not drink alcohol and drinks caffeine in the form of soda, about 10 ounce per day but sometimes more.  She reports that she does not always use her CPAP when she stays with her mother, she stays with her 33 year old mother  and does not always know how long she will stay with her, she does not take her machine with her.  She uses nasal pillows and generally speaking gets her supplies on a 6 monthly basis from [employer].  She has been changing her filter on a regular basis.  She reports no issues using her machine and it looks like it was set up in 2017.  She reports a late bedtime of around 1 or 2 AM.  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  Rise time is generally between 10 and 1030.  Her  Past Medical History Is Significant For: Past Medical History:  Diagnosis Date   Anxiety    Arthritis    CHF (congestive heart failure) ([facility])    Chronic pain of multiple joints    Depression    Essential hypertension    GERD (gastroesophageal reflux disease)    Hyperlipidemia    Hyperlipidemia associated with type 2 diabetes mellitus ([place])    Hypertension associated with type 2 diabetes mellitus ([place])    Hypothyroidism    Mild scoliosis 10/19/2021   Osteoporosis    Sleep apnea    CPAP   Type 2 diabetes mellitus with hypercholesterolemia ([place])    Vitamin D deficiency     Her Past Surgical History Is Significant For: Past Surgical History:  Procedure Laterality Date   ABDOMINAL HERNIA REPAIR     ABDOMINAL HYSTERECTOMY  1977   BREAST EXCISIONAL BIOPSY Right    EYE SURGERY  2020   bilateral cataract removal    KNEE ARTHROSCOPY Left 09/30/2014   Procedure: LEFT KNEE ARTHROSCOPY MEDIAL MENISECTOMY ABRASION CHONDROPLASTY SYNOVECTOMY SUPRAPATELLER CUFF;  Surgeon: Tobi Bastos, MD;  Location: [facility];  Service: Orthopedics;  Laterality: Left;    Her Family History Is Significant For: Family History  Problem Relation Age of Onset   Hypertension Mother    Stroke Mother    Cancer Father     Her Social History Is Significant For: Social History   Socioeconomic History   Marital status: Widowed    Spouse name: Laverna Peace   Number of children: 2   Years of education: Not on file   Highest education level: High school graduate  Occupational History   Occupation: CNA    Comment: [employer]   Occupation: retired  Tobacco Use   Smoking status: Never   Smokeless tobacco: Never  Scientific laboratory technician Use: Never used  Substance and Sexual Activity   Alcohol use: No   Drug use: No   Sexual activity: Not Currently  Other Topics Concern   Not on file  Social History Narrative   Lives with her daughter   Social Determinants of Health   Financial Resource Strain: Low Risk     Difficulty of Paying Living Expenses: Not very hard  Food Insecurity: No Food Insecurity   Worried About Charity fundraiser in the Last Year: Never true   [place] in the Last Year: Never true  Transportation Needs: No Transportation Needs   Lack of Transportation (Medical): No   Lack of Transportation (Non-Medical): No  Physical Activity: Insufficiently Active   Days of Exercise per Week: 3 days   Minutes of Exercise per Session: 20 min  Stress: No Stress Concern Present   Feeling of Stress : Not at all  Social Connections: Socially Isolated   Frequency of Communication with Friends and Family: More than three times a week  Frequency of Social Gatherings with Friends and Family: More than three times a week   Attends Religious Services: Never   Marine scientist or Organizations: No   Attends Archivist Meetings: Never   Marital Status: Widowed    Her Allergies Are:  Allergies  Allergen Reactions   Crestor [Rosuvastatin] Other (See Comments)    Body aches.    Nsaids     Burns stomach.    Vytorin [Ezetimibe-Simvastatin]     Body aches.    Aspirin Nausea Only   Tolmetin Nausea Only    Burns stomach.   :   Her Current Medications Are:  Outpatient Encounter Medications as of 05/23/2022  Medication Sig   acetaminophen (TYLENOL) 500 MG tablet Take 1 tablet (500 mg total) by mouth every 6 (six) hours as needed.   albuterol (VENTOLIN HFA) 108 (90 Base) MCG/ACT inhaler TAKE 2 PUFFS BY MOUTH EVERY 6 HOURS AS NEEDED FOR WHEEZE OR SHORTNESS OF BREATH   amLODipine (NORVASC) 10 MG tablet Take 1 tablet (10 mg total) by mouth daily.   calcitonin, salmon, (MIACALCIN/FORTICAL) 200 UNIT/ACT nasal spray 1 spray every morning.   calcium-vitamin D (OSCAL WITH D) 500-200 MG-UNIT TABS tablet TAKE 1 TABLET BY MOUTH EVERY DAY WITH BREAKFAST   Cholecalciferol (VITAMIN D-3 PO) Take 1 capsule by mouth daily.    cyclobenzaprine (FLEXERIL) 5 MG tablet Take 1 tablet (5 mg total)  by mouth 3 (three) times daily as needed for muscle spasms.   diclofenac Sodium (VOLTAREN) 1 % GEL APPLY 4 GRAMS TOPICALLY 4 (FOUR) TIMES DAILY.   diphenhydrAMINE (BENADRYL) 25 MG tablet Take 50 mg by mouth at bedtime as needed for allergies.    erythromycin ophthalmic ointment Place 1 application. into both eyes 4 (four) times daily.   Ferrous Sulfate (IRON) 325 (65 Fe) MG TABS Take 1 tablet by mouth 3 (three) times daily.   gabapentin (NEURONTIN) 300 MG capsule TAKE 1 CAPSULE BY MOUTH 3 TIMES DAILY   ketoconazole (NIZORAL) 2 % cream APPLY TO AFFECTED AREA EVERYDAY   levothyroxine (SYNTHROID) 100 MCG tablet Take 1 tablet (100 mcg total) by mouth daily before breakfast.   lidocaine (LIDODERM) 5 % PLACE 1 PATCH ONTO THE SKIN DAILY. REMOVE & DISCARD PATCH WITHIN 12 HOURS OR AS DIRECTED BY MD   lisinopril (ZESTRIL) 20 MG tablet Take 1 tablet (20 mg total) by mouth daily.   lovastatin (MEVACOR) 40 MG tablet Take 1 tablet (40 mg total) by mouth at bedtime. (NEEDS TO BE SEEN BEFORE NEXT REFILL)   metoprolol succinate (TOPROL-XL) 25 MG 24 hr tablet Take 1 tablet (25 mg total) by mouth daily.   mirabegron ER (MYRBETRIQ) 25 MG TB24 tablet Take 1 tablet (25 mg total) by mouth daily.   Multiple Vitamin (MULTI-VITAMIN) tablet Take by mouth.   Multiple Vitamins-Minerals (PRESERVISION AREDS 2 PO) Take by mouth.   nitrofurantoin, macrocrystal-monohydrate, (MACROBID) 100 MG capsule Take 100 mg by mouth daily.   nystatin (MYCOSTATIN/NYSTOP) powder APPLY TO AFFECTED AREA 4 TIMES A DAY   omeprazole (PRILOSEC) 20 MG capsule TAKE 1 CAPSULE (20 MG TOTAL) BY MOUTH 2 (TWO) TIMES DAILY BEFORE A MEAL.   pyridOXINE (VITAMIN B-6) 100 MG tablet Take 100 mg by mouth daily.   sertraline (ZOLOFT) 100 MG tablet TAKE 2 TABLETS BY MOUTH EVERY DAY   sodium chloride (OCEAN) 0.65 % SOLN nasal spray Place 1 spray into both nostrils as needed for congestion.   denosumab (PROLIA) 60 MG/ML SOSY injection Bring to your doctor  to inject  60 mg subcutaneously. (Patient not taking: Reported on 05/23/2022)   PROLIA 60 MG/ML SOSY injection TO BE ADMINISTERED IN PHYSICIAN'S OFFICE. INJECT ONE SYRINGE SUBCOUSLY ONCE EVERY 6 MONTHS. REFRIGERATE. USE WITHIN 14 DAYS ONCE AT ROOM TEMPERATURE. (Patient not taking: Reported on 05/23/2022)   [DISCONTINUED] amoxicillin (AMOXIL) 500 MG capsule Take 500 mg by mouth 4 (four) times daily.   [DISCONTINUED] fluconazole (DIFLUCAN) 150 MG tablet Take 1 tablet (150 mg total) by mouth every three (3) days as needed.   No facility-administered encounter medications on file as of 05/23/2022.  :  Review of Systems:  Out of a complete 14 point review of systems, all are reviewed and negative with the exception of these symptoms as listed below:   Review of Systems  Neurological:        Discuss Inspire Device.  Having issues using oxygen and cpap.  Last used early Nov 2022. Had shattered shoulder was in rehab facility, did not use she said from 08/2021.    Objective:  Neurological Exam  Physical Exam Physical Examination:   Vitals:   05/23/22 0834  BP: (!) 153/75  Pulse: (!) 56    General Examination: The patient is a very pleasant 81 y.o. female in no acute distress. She appears well-developed and well-nourished and well groomed.   HEENT: Normocephalic, atraumatic, pupils are equal, round and reactive to light, corrective eyeglasses in place.  Extraocular tracking is good without limitation to gaze excursion or nystagmus noted. Hearing is mildly impaired, face is symmetric with normal facial animation, airway examination reveals moderate mouth dryness, several missing teeth with marginal dental hygiene, moderate airway crowding.  Neck circumference of 19-1/4 inches.  Tongue protrudes centrally in palate elevates symmetrically.  No carotid bruits.   Chest: Clear to auscultation without wheezing, rhonchi or crackles noted.   Heart: S1+S2+0, no murmur noted.      Abdomen: Soft, non-tender and  non-distended.   Extremities: There is trace pitting edema in the distal lower extremities bilaterally.    Skin: Warm and dry without trophic changes noted.    Musculoskeletal: exam reveals no obvious joint deformities, left higher than right, slight decrease in range of motion in the right shoulder but good elevation above head.     Neurologically:  Mental status: The patient is awake, alert and oriented in all 4 spheres. Her immediate and remote memory, attention, language skills and fund of knowledge are appropriate. There is no evidence of aphasia, agnosia, apraxia or anomia. Speech is clear with normal prosody and enunciation. Thought process is linear. Mood is normal and affect is normal.  Cranial nerves II - XII are as described above under HEENT exam.  Motor exam: Normal bulk, strength and tone is noted. There is no obvious tremor. Fine motor skills and coordination: grossly intact.  Cerebellar testing: No dysmetria or intention tremor. There is no truncal or gait ataxia.  Sensory exam: intact to light touch in the upper and lower extremities.  Gait, station and balance: She stands slowly and with mild difficulty, walks with a single-point cane.   Assessment and Plan:  In summary, Danielle Parrish is a very pleasant 81 year old female with an underlying medical history of hypertension, hyperlipidemia, reflux disease, depression, anxiety, congestive heart failure, hypothyroidism, osteoporosis, vitamin D deficiency, diabetes and obesity, who presents for follow-up consultation of her obstructive sleep apnea.  Sleep testing in 2004 indicated severe obstructive sleep apnea.  She has been on CPAP of 13 cm but has not used  her machine since November 2022.  She was placed on oxygen at night in October 2022 as per PCP.  She is advised to proceed with a sleep study for reevaluation.  We talked at length about her alternative treatment options.  She would not be a good candidate for dental device  due to teeth and gum issues.  She would be willing to get back on PAP therapy.  She has had some weight fluctuation.  She may be a candidate for inspire but would rather avoid surgical treatment at this time.  We talked about inspire process and the different steps at length today.  She may not be eligible for a new machine, we will try to find out from her DME company.  She may do better with a slightly lower pressure.  I will order a split-night sleep study so we can optimize treatment settings during the sleep study hopefully.  She is agreeable to this approach.  We will plan a follow-up after testing. I answered all her questions today and she was in agreement with the plan. I spent 40 minutes in total face-to-face time and in reviewing records during pre-charting, more than 50% of which was spent in counseling and coordination of care, reviewing test results, reviewing medications and treatment regimen and/or in discussing or reviewing the diagnosis of OSA, the prognosis and treatment options. Pertinent laboratory and imaging test results that were available during this visit with the patient were reviewed by me and considered in my medical decision making (see chart for details).

## 2022-05-23 NOTE — Telephone Encounter (Signed)
I spoke to [place] at [employer].  They set up pt on machine 2017.  They have not given her a recent machine, she has gotten supplies since.  I called pt to verify again.  I told her what I found out from [place] and she said ok.  She will have sleep study then will go from there.

## 2022-05-23 NOTE — Telephone Encounter (Signed)
Called and LMVM for Suanne Marker at [facility] about when pt received her last cpap machine.  I see on airview was set up 2017. Pt says been more recent.

## 2022-05-23 NOTE — Patient Instructions (Signed)
It was nice to see you again today. Here is what we discussed today:    Based on your symptoms and your exam I believe you are still at risk for obstructive sleep apnea (aka OSA). We should proceed with a sleep study to determine how severe her sleep apnea is.  Based on the severity, you may be a candidate for inspire treatment.  We may be able to adjust your treatment settings on her CPAP based on the sleep study results as well.  I do not believe you are a candidate for an oral appliance given your teeth and gum issues.   As explained, an attended sleep study (meaning you get to stay overnight in the sleep lab), lets [place] monitor sleep-related behaviors such as sleep talking and leg movements in sleep, in addition to monitoring for sleep apnea.  A home sleep test is a screening tool for sleep apnea diagnosis only, but unfortunately, does not help with any other sleep-related diagnoses.  Please remember, the long-term risks and ramifications of untreated moderate to severe obstructive sleep apnea may include (but are not limited to): increased risk for cardiovascular disease, including congestive heart failure, stroke, difficult to control hypertension, treatment resistant obesity, arrhythmias, especially irregular heartbeat commonly known as A. Fib. (atrial fibrillation); even type 2 diabetes has been linked to untreated OSA.   Other correlations that untreated obstructive sleep apnea include macular edema which is swelling of the retina in the eyes, droopy eyelid syndrome, and elevated hemoglobin and hematocrit levels (often referred to as polycythemia).  Sleep apnea can cause disruption of sleep and sleep deprivation in most cases, which, in turn, can cause recurrent headaches, problems with memory, mood, concentration, focus, and vigilance. Most people with untreated sleep apnea report excessive daytime sleepiness, which can affect their ability to drive. Please do not drive or use heavy equipment or  machinery, if you feel sleepy! Patients with sleep apnea can also develop difficulty initiating and maintaining sleep (aka insomnia).   Having sleep apnea may increase your risk for other sleep disorders, including involuntary behaviors sleep such as sleep terrors, sleep talking, sleepwalking.    Having sleep apnea can also increase your risk for restless leg syndrome and leg movements at night.   Please note that untreated obstructive sleep apnea may carry additional perioperative morbidity. Patients with significant obstructive sleep apnea (typically, in the moderate to severe degree) should receive, if possible, perioperative PAP (positive airway pressure) therapy and the surgeons and particularly the anesthesiologists should be informed of the diagnosis and the severity of the sleep disordered breathing.   We will call you or email you through [place] with regards to your test results and plan a follow-up in sleep clinic accordingly. Most likely, you will hear from one of our nurses.   Our sleep lab administrative assistant will call you to schedule your sleep study and give you further instructions, regarding the check in process for the sleep study, arrival time, what to bring, when you can expect to leave after the study, etc., and to answer any other logistical questions you may have. If you don't hear back from her by about 2 weeks from now, please feel free to call her direct line at [phone] or you can call our general clinic number, or email [place] through My Chart.

## 2022-05-24 NOTE — Telephone Encounter (Signed)
Split- [employer] medicare/mediciad no auth req . Patient is scheduled at [facility] for 05/31/22 at 8 pm. I mailed the packet out to her.

## 2022-05-25 DIAGNOSIS — M8008XG Age-related osteoporosis with current pathological fracture, vertebra(e), subsequent encounter for fracture with delayed healing: Secondary | ICD-10-CM | POA: Diagnosis not present

## 2022-05-25 DIAGNOSIS — S22070D Wedge compression fracture of T9-T10 vertebra, subsequent encounter for fracture with routine healing: Secondary | ICD-10-CM | POA: Diagnosis not present

## 2022-05-25 DIAGNOSIS — S22070G Wedge compression fracture of T9-T10 vertebra, subsequent encounter for fracture with delayed healing: Secondary | ICD-10-CM | POA: Diagnosis not present

## 2022-05-29 ENCOUNTER — Other Ambulatory Visit: Payer: Self-pay | Admitting: Family Medicine

## 2022-05-29 DIAGNOSIS — E1169 Type 2 diabetes mellitus with other specified complication: Secondary | ICD-10-CM

## 2022-05-31 ENCOUNTER — Ambulatory Visit (INDEPENDENT_AMBULATORY_CARE_PROVIDER_SITE_OTHER): Payer: Medicare Other | Admitting: Neurology

## 2022-05-31 DIAGNOSIS — E669 Obesity, unspecified: Secondary | ICD-10-CM

## 2022-05-31 DIAGNOSIS — G4733 Obstructive sleep apnea (adult) (pediatric): Secondary | ICD-10-CM | POA: Diagnosis not present

## 2022-05-31 DIAGNOSIS — G4734 Idiopathic sleep related nonobstructive alveolar hypoventilation: Secondary | ICD-10-CM

## 2022-05-31 DIAGNOSIS — G4720 Circadian rhythm sleep disorder, unspecified type: Secondary | ICD-10-CM

## 2022-05-31 DIAGNOSIS — G4761 Periodic limb movement disorder: Secondary | ICD-10-CM

## 2022-05-31 DIAGNOSIS — Z9981 Dependence on supplemental oxygen: Secondary | ICD-10-CM

## 2022-05-31 DIAGNOSIS — R351 Nocturia: Secondary | ICD-10-CM

## 2022-05-31 DIAGNOSIS — Z789 Other specified health status: Secondary | ICD-10-CM

## 2022-06-01 ENCOUNTER — Other Ambulatory Visit: Payer: Self-pay | Admitting: Family Medicine

## 2022-06-01 DIAGNOSIS — E039 Hypothyroidism, unspecified: Secondary | ICD-10-CM

## 2022-06-09 NOTE — Addendum Note (Signed)
Addended by: Star Age on: 06/09/2022 12:37 PM   Modules accepted: Orders

## 2022-06-09 NOTE — Procedures (Signed)
PATIENT'S NAME:  Danielle Parrish, Danielle Parrish DOB:      [birth year]      MR#:    [id number]     DATE OF RECORDING: 05/31/2022 REFERRING M.D.:   Study Performed:  Split-Night Titration Study HISTORY: 81 year old woman with a history of hypertension, hyperlipidemia, reflux disease, depression, anxiety, congestive heart failure, hypothyroidism, osteoporosis, recurrent falls, history of right shoulder reverse arthroplasty on 08/17/2021 and right distal radius fracture, lumbar compression fractures, thoracic compression fractures, status post lumbar vertebroplasty, vitamin D deficiency, diabetes and obesity, who presents for re-evaluation of her sleep apnea. She has not used her CPAP recently. She has supplemental oxygen at home. The patient endorsed the Epworth Sleepiness Scale at 8 points. The patient's weight 168 pounds with a height of 62 (inches), resulting in a BMI of 30.8 kg/m2. The patient's neck circumference measured 19.2 inches.  CURRENT MEDICATIONS: Tylenol, Ventolin HFA, Norvasc, Miacalcin, Oscal w/D, Vitamin D3, Flexeril, Voltaren, Benadryl, Iron, Neurontin, Nizoral, Synthroid, Lidoderm, Zestril, Mevacor, Toprol-XL, Myrbetriq, Multivitamin, Macrobid, Mycostatin, Prilosec, Vit B6, Zoloft  PROCEDURE:  This is a multichannel digital polysomnogram utilizing the Somnostar 11.2 system.  Electrodes and sensors were applied and monitored per AASM Specifications.   EEG, EOG, Chin and Limb EMG, were sampled at 200 Hz.  ECG, Snore and Nasal Pressure, Thermal Airflow, Respiratory Effort, CPAP Flow and Pressure, Oximetry was sampled at 50 Hz. Digital video and audio were recorded.      BASELINE STUDY WITHOUT CPAP RESULTS:  Lights Out was at 21:50 and Lights On at 04:59 for the night, PAP start at 00:30.  Total recording time (TRT) was 159, with a total sleep time (TST) of 135.5 minutes.   The patient's sleep latency was 13.5 minutes.  REM sleep was absent. The sleep efficiency was 85.2 %.    SLEEP ARCHITECTURE: WASO (Wake  after sleep onset) was 6.5 minutes, Stage N1 was 7.5 minutes, Stage N2 was 128 minutes, Stage N3 was 0 minutes and Stage R (REM sleep) was 0 minutes.  The percentages were Stage N1 5.5%, Stage N2 94.5%, which is markedly increased, Stage N3 and Stage R (REM sleep) were absent. The arousals were noted as: 13 were spontaneous, 0 were associated with PLMs, 92 were associated with respiratory events.  RESPIRATORY ANALYSIS:  There were a total of 137 respiratory events:  104 obstructive apneas, 1 central apneas and 2 mixed apneas with a total of 107 apneas and an apnea index (AI) of 47.4. There were 30 hypopneas with a hypopnea index of 13.3. The patient also had 1 respiratory event related arousals (RERAs).  Snoring was noted.     The total APNEA/HYPOPNEA INDEX (AHI) was 60.7 /hour and the total RESPIRATORY DISTURBANCE INDEX was 61.1 /hour.  0 events occurred in REM sleep and 63 events in NREM. The REM AHI was n/a versus a non-REM AHI of 60.7 /hour. The patient spent 61 minutes sleep time in the supine position 318 minutes in non-supine. The supine AHI was 51.4 /hour versus a non-supine AHI of 62.7 /hour.  OXYGEN SATURATION & C02:  The wake baseline 02 saturation was 85%, with the lowest being 72%. The patient was started on supplemental oxygen due to sustained low oxygen saturations. O2 was started at 22:32 at 1 lpm. As CPAP was titrated, so was the O2. On CPAP of 8 cm, her O2 was averaging 89-90% with O2 at 4 lpm. On 5 lpm, her average O2 saturation with CPAP of 8 cm was 91%. Time spent below 89% saturation equaled 130 minutes.  PERIODIC LIMB MOVEMENTS: The patient had a total of 0 Periodic Limb Movements.  The Periodic Limb Movement (PLM) index was 0 /hour and the PLM Arousal index was 0 /hour.  Audio and video analysis did not show any abnormal or unusual movements, behaviors, phonations or vocalizations. The patient took 1 bathroom break. Mild to moderate snoring was noted. The EKG was in keeping with  normal sinus rhythm (NSR).   TITRATION STUDY WITH CPAP RESULTS:   The patient was fitted with medium Swift FX nasal pillows. CPAP was initiated at 5 cmH20 with heated humidity per AASM split night standards and pressure was advanced to 8 cm H20 because of hypopneas, apneas and desaturations.  At a PAP pressure of 8 cmH20, there was a reduction of the AHI to 1.8 /hour.  As CPAP was titrated, so was the O2. On CPAP of 8 cm, her O2 was averaging 89-90% with O2 at 4 lpm. On 5 lpm, her average O2 saturation with CPAP of 8 cm was 91%.  Total recording time (TRT) was 270 minutes, with a total sleep time (TST) of 243.5 minutes. The patient's sleep latency was 1 minutes. REM latency was 100 minutes.  The sleep efficiency was 90.2 %.    SLEEP ARCHITECTURE: Wake after sleep was 5.5 minutes, Stage N1 0.5 minutes, Stage N2 197 minutes, Stage N3 20 minutes and Stage R (REM sleep) 26 minutes. The percentages were: Stage N1 .2%, Stage N2 80.9%, Stage N3 8.2% and Stage R (REM sleep) 10.7%.   The arousals were noted as: 27 were spontaneous, 9 were associated with PLMs, 1 were associated with respiratory events.  RESPIRATORY ANALYSIS:  There were a total of 9 respiratory events: 5 obstructive apneas, 1 central apneas and 2 mixed apneas with a total of 8 apneas and an apnea index (AI) of 2.. There were 1 hypopneas with a hypopnea index of .2 /hour. The patient also had 1 respiratory event related arousals (RERAs).      The total APNEA/HYPOPNEA INDEX  (AHI) was 2.2 /hour and the total RESPIRATORY DISTURBANCE INDEX was 2.5 /hour.  3 events occurred in REM sleep and 6 events in NREM. The REM AHI was 6.9 /hour versus a non-REM AHI of 1.7 /hour. REM sleep was achieved on a pressure of  cm/h2o (AHI was  .) The patient spent 15% of total sleep time in the supine position. The supine AHI was 0.0 /hour, versus a non-supine AHI of 2.6/hour.  OXYGEN SATURATION & C02:  The wake baseline 02 saturation was 86%, with the lowest being  78%. Time spent below 89% saturation equaled 193 minutes.  PERIODIC LIMB MOVEMENTS:    The patient had a total of 108 Periodic Limb Movements. The Periodic Limb Movement (PLM) index was 26.6/hour and the PLM Arousal index was 2.2 /hour.  Post-study, the patient indicated that sleep was the same as usual.  POLYSOMNOGRAPHY IMPRESSION :   Severe Obstructive Sleep Apnea (OSA)  Nocturnal hypoxemia with oxygen dependence Dysfunctions associated with sleep stages or arousals from sleep Periodic Limb Movement Disorder (PLMD)  RECOMMENDATIONS:  This patient has severe obstructive sleep apnea with a baseline AHI of 60.7/hour and O2 nadir of 72%. Of note, the absence of REM sleep during the diagnostic portion of the study likely underestimates her AHI and/or O2 nadir. The study shows evidence of nocturnal hypoxemia, requiring supplemental oxygen, due to sustained desaturations despite optimal apnea control on CPAP therapy. She had adequate apnea reduction on CPAP of 8 cm, but O2 was  well below 90% saturation. She required fairly high flow oxygen at up to 5 lpm bleed-in. She will be advised to follow-up with her primary care physician to discuss referral to pulmonology.  She has an inhaler, Ventolin, on her medication list but no formal diagnosis of COPD or asthma.  She may have an underlying lung disease.  I will recommend home CPAP treatment at a pressure of 8 cm via medium FFM, with heated humidity and added O2 at 4 lpm for now. The patient should be reminded to be fully compliant with PAP therapy and her O2 to improve sleep related symptoms and decrease long term cardiovascular risks. Please note that untreated obstructive sleep apnea may carry additional perioperative morbidity. Patients with significant obstructive sleep apnea should receive perioperative PAP therapy and the surgeons and particularly the anesthesiologist should be informed of the diagnosis and the severity of the sleep disordered  breathing. The patient had low oxygen saturations throughout the night including during wakefulness and during sleep, even in the absence of obstructive respiratory events.  She was started on supplemental oxygen at 22:32 at 1 L/min and titrated as CPAP was titrated to up to 5 lpm bleed-in O2 with CPAP.  Moderate PLMs (periodic limb movements of sleep) were noted during this study with no significant arousals; clinical correlation is recommended. Medication effect from the antidepressant medication should be considered.  This study showed no significant sleep fragmentation, but abnormal sleep stage percentages; these are nonspecific findings and per se do not signify an intrinsic sleep disorder or a cause for the patient's sleep-related symptoms. Causes include (but are not limited to) the first night effect of the sleep study, circadian rhythm disturbances, medication effect or an underlying mood disorder or medical problem.  The patient should be cautioned not to drive, work at heights, or operate dangerous or heavy equipment when tired or sleepy. Review and reiteration of good sleep hygiene measures should be pursued with any patient. The patient will be seen in follow-up in the sleep clinic at [facility] for discussion of the test results, symptom and treatment compliance review, further management strategies, etc. The referring provider will be notified of the test results.  I certify that I have reviewed the entire raw data recording prior to the issuance of this report in accordance with the Standards of Accreditation of the American Academy of Sleep Medicine (AASM)  Star Age, MD, PhD Diplomat, American Board of Neurology and Sleep Medicine (Neurology and Sleep Medicine)

## 2022-06-13 ENCOUNTER — Telehealth: Payer: Self-pay

## 2022-06-13 NOTE — Telephone Encounter (Signed)
I called pt. I advised pt that Dr. Rexene Alberts reviewed their sleep study results and found that pt has severe osa with significantly low oxygen saturations throughout the night. Dr. Rexene Alberts recommends that pt start a CPAP with supplemental O2. She also recommends that patient follow up with her PCP to discuss a referral to a pulmonologist. I reviewed PAP compliance expectations with the pt. Pt is agreeable to starting a CPAP. I advised pt that an order will be sent to a DME, [place], and [place] will call the pt within about one week after they file with the pt's insurance. [facility] will show the pt how to use the machine, fit for masks, and troubleshoot the CPAP if needed. A follow up appt was made for insurance purposes with Jinny Blossom, NP on 09/19/22 at 11:30am. Pt verbalized understanding to arrive 15 minutes early and bring their CPAP. A letter with all of this information in it will be mailed to the pt as a reminder. I verified with the pt that the address we have on file is correct. Pt verbalized understanding of results. Pt had no questions at this time but was encouraged to call back if questions arise. I have sent the order to [facility] and have received confirmation that they have received the order.

## 2022-06-13 NOTE — Telephone Encounter (Signed)
-----   Message from Star Age, MD sent at 06/09/2022 12:37 PM EDT ----- Please call patient regarding her recent sleep study.   The patient was last seen by me on 05/23/2022, at which time she was not compliant with her CPAP, she also has supplemental oxygen at home.   She had a split-night sleep study for reevaluation on 05/31/2022 which showed rather severe obstructive sleep apnea and significantly low oxygen saturations throughout the night requiring supplemental oxygen as well as CPAP therapy.  Please advise patient that I will write for a new CPAP machine at a pressure of 8 cm and supplemental oxygen at 4 L/min.  I highly recommend that she make a follow-up appointment with her primary care physician to discuss seeing a pulmonologist.  She may have an underlying lung disease that requires further attention.  She had significantly low oxygen saturations even during wakefulness before she fell asleep for her sleep study.  We will send the order for the CPAP as well as supplemental oxygen to her current DME company and she will need a follow-up within 2 to 3 months after starting treatment at home.  She may be eligible for a new machine, but I am not fully sure. Star Age, MD, PhD [facility])

## 2022-06-15 ENCOUNTER — Telehealth: Payer: Self-pay

## 2022-06-15 NOTE — Telephone Encounter (Signed)
Surgical clearance, form, ov notes, recent labs and med list faxed to [facility] at [phone]

## 2022-06-17 DIAGNOSIS — R0902 Hypoxemia: Secondary | ICD-10-CM | POA: Diagnosis not present

## 2022-06-19 ENCOUNTER — Encounter: Payer: Self-pay | Admitting: Family Medicine

## 2022-06-19 ENCOUNTER — Ambulatory Visit (INDEPENDENT_AMBULATORY_CARE_PROVIDER_SITE_OTHER): Payer: Medicare Other | Admitting: Family Medicine

## 2022-06-19 VITALS — BP 138/65 | HR 58 | Temp 98.4°F | Ht 62.0 in | Wt 172.0 lb

## 2022-06-19 DIAGNOSIS — K219 Gastro-esophageal reflux disease without esophagitis: Secondary | ICD-10-CM

## 2022-06-19 DIAGNOSIS — E7800 Pure hypercholesterolemia, unspecified: Secondary | ICD-10-CM

## 2022-06-19 DIAGNOSIS — R5383 Other fatigue: Secondary | ICD-10-CM

## 2022-06-19 DIAGNOSIS — I152 Hypertension secondary to endocrine disorders: Secondary | ICD-10-CM

## 2022-06-19 DIAGNOSIS — R739 Hyperglycemia, unspecified: Secondary | ICD-10-CM | POA: Diagnosis not present

## 2022-06-19 DIAGNOSIS — E1159 Type 2 diabetes mellitus with other circulatory complications: Secondary | ICD-10-CM | POA: Diagnosis not present

## 2022-06-19 DIAGNOSIS — E1169 Type 2 diabetes mellitus with other specified complication: Secondary | ICD-10-CM | POA: Diagnosis not present

## 2022-06-19 DIAGNOSIS — E039 Hypothyroidism, unspecified: Secondary | ICD-10-CM

## 2022-06-19 DIAGNOSIS — R011 Cardiac murmur, unspecified: Secondary | ICD-10-CM

## 2022-06-19 DIAGNOSIS — R062 Wheezing: Secondary | ICD-10-CM | POA: Diagnosis not present

## 2022-06-19 DIAGNOSIS — E785 Hyperlipidemia, unspecified: Secondary | ICD-10-CM | POA: Diagnosis not present

## 2022-06-19 LAB — BAYER DCA HB A1C WAIVED: HB A1C (BAYER DCA - WAIVED): 5.1 % (ref 4.8–5.6)

## 2022-06-19 MED ORDER — AMLODIPINE BESYLATE 10 MG PO TABS: 10.0000 mg | ORAL_TABLET | Freq: Every day | ORAL | 3 refills | Status: DC

## 2022-06-19 MED ORDER — LOVASTATIN 40 MG PO TABS: ORAL_TABLET | ORAL | 3 refills | Status: DC

## 2022-06-19 MED ORDER — ALBUTEROL SULFATE HFA 108 (90 BASE) MCG/ACT IN AERS: INHALATION_SPRAY | RESPIRATORY_TRACT | 3 refills | Status: DC

## 2022-06-19 MED ORDER — OMEPRAZOLE 20 MG PO CPDR: 20.0000 mg | DELAYED_RELEASE_CAPSULE | Freq: Two times a day (BID) | ORAL | 3 refills | Status: AC

## 2022-06-19 MED ORDER — METOPROLOL SUCCINATE ER 25 MG PO TB24: 25.0000 mg | ORAL_TABLET | Freq: Every day | ORAL | 3 refills | Status: DC

## 2022-06-19 MED ORDER — LISINOPRIL 20 MG PO TABS: 20.0000 mg | ORAL_TABLET | Freq: Every day | ORAL | 3 refills | Status: DC

## 2022-06-20 ENCOUNTER — Telehealth: Payer: Self-pay

## 2022-06-20 DIAGNOSIS — R7981 Abnormal blood-gas level: Secondary | ICD-10-CM

## 2022-06-20 DIAGNOSIS — M5136 Other intervertebral disc degeneration, lumbar region: Secondary | ICD-10-CM

## 2022-06-20 DIAGNOSIS — S22000S Wedge compression fracture of unspecified thoracic vertebra, sequela: Secondary | ICD-10-CM

## 2022-06-20 DIAGNOSIS — R062 Wheezing: Secondary | ICD-10-CM

## 2022-06-20 LAB — CBC WITH DIFFERENTIAL/PLATELET
Basophils Absolute: 0 10*3/uL (ref 0.0–0.2)
Basos: 1 %
EOS (ABSOLUTE): 0.1 10*3/uL (ref 0.0–0.4)
Eos: 2 %
Hematocrit: 40.2 % (ref 34.0–46.6)
Hemoglobin: 13.3 g/dL (ref 11.1–15.9)
Immature Grans (Abs): 0 10*3/uL (ref 0.0–0.1)
Immature Granulocytes: 0 %
Lymphocytes Absolute: 2.2 10*3/uL (ref 0.7–3.1)
Lymphs: 52 %
MCH: 31.9 pg (ref 26.6–33.0)
MCHC: 33.1 g/dL (ref 31.5–35.7)
MCV: 96 fL (ref 79–97)
Monocytes Absolute: 0.4 10*3/uL (ref 0.1–0.9)
Monocytes: 9 %
Neutrophils Absolute: 1.5 10*3/uL (ref 1.4–7.0)
Neutrophils: 36 %
Platelets: 175 10*3/uL (ref 150–450)
RBC: 4.17 x10E6/uL (ref 3.77–5.28)
RDW: 12 % (ref 11.7–15.4)
WBC: 4.2 10*3/uL (ref 3.4–10.8)

## 2022-06-20 LAB — CMP14+EGFR
ALT: 16 IU/L (ref 0–32)
AST: 14 IU/L (ref 0–40)
Albumin/Globulin Ratio: 2.1 (ref 1.2–2.2)
Albumin: 4.4 g/dL (ref 3.6–4.6)
Alkaline Phosphatase: 76 IU/L (ref 44–121)
BUN/Creatinine Ratio: 24 (ref 12–28)
BUN: 15 mg/dL (ref 8–27)
Bilirubin Total: 0.4 mg/dL (ref 0.0–1.2)
CO2: 24 mmol/L (ref 20–29)
Calcium: 10.6 mg/dL — ABNORMAL HIGH (ref 8.7–10.3)
Chloride: 102 mmol/L (ref 96–106)
Creatinine, Ser: 0.63 mg/dL (ref 0.57–1.00)
Globulin, Total: 2.1 g/dL (ref 1.5–4.5)
Glucose: 90 mg/dL (ref 70–99)
Potassium: 4.6 mmol/L (ref 3.5–5.2)
Sodium: 142 mmol/L (ref 134–144)
Total Protein: 6.5 g/dL (ref 6.0–8.5)
eGFR: 89 mL/min/{1.73_m2} (ref >59)

## 2022-06-20 LAB — LIPID PANEL
Chol/HDL Ratio: 3.5 ratio (ref 0.0–4.4)
Cholesterol, Total: 176 mg/dL (ref 100–199)
HDL: 51 mg/dL (ref >39)
LDL Chol Calc (NIH): 95 mg/dL (ref 0–99)
Triglycerides: 172 mg/dL — ABNORMAL HIGH (ref 0–149)
VLDL Cholesterol Cal: 30 mg/dL (ref 5–40)

## 2022-06-20 LAB — TSH: TSH: 1.35 u[IU]/mL (ref 0.450–4.500)

## 2022-06-21 ENCOUNTER — Other Ambulatory Visit: Payer: Self-pay | Admitting: Family Medicine

## 2022-06-21 DIAGNOSIS — M1990 Unspecified osteoarthritis, unspecified site: Secondary | ICD-10-CM

## 2022-06-21 DIAGNOSIS — R0981 Nasal congestion: Secondary | ICD-10-CM

## 2022-06-21 DIAGNOSIS — M1711 Unilateral primary osteoarthritis, right knee: Secondary | ICD-10-CM

## 2022-06-21 MED ORDER — AZELASTINE HCL 0.1 % NA SOLN: 1.0000 | Freq: Two times a day (BID) | NASAL | 12 refills | Status: AC

## 2022-06-21 NOTE — Telephone Encounter (Signed)
Patient aware of referrals placed and prescription sent to pharmacy

## 2022-06-21 NOTE — Telephone Encounter (Signed)
Patient calling to check on message left 6-27. Hadn't heard back and told patient [place] was off 6-27 and hadn't addressed message yet.

## 2022-06-21 NOTE — Progress Notes (Signed)
Patient aware.

## 2022-06-21 NOTE — Progress Notes (Signed)
I sent nasal spray for the patient, I have placed referral to physical therapy for her.  I have already placed the pulmonology referral but they will not take stat based on this being a chronic issue.

## 2022-06-28 ENCOUNTER — Ambulatory Visit: Payer: Self-pay | Admitting: *Deleted

## 2022-06-28 NOTE — Chronic Care Management (AMB) (Signed)
   06/28/2022  Danielle Parrish 04/30/1941 [id number]  Patient has not recently engaged with the CCM Team at [facility]. Removing RN Care Manager from Care Team and closing [facility]. I will reach out to PharmD and/or LCSW to inform them of my case closure.  [name], BSN, RN-BC Embedded Chronic Care Manager [facility] [place] [facility] Direct Dial: [phone]

## 2022-07-04 ENCOUNTER — Encounter: Payer: Self-pay | Admitting: Internal Medicine

## 2022-07-04 ENCOUNTER — Ambulatory Visit (INDEPENDENT_AMBULATORY_CARE_PROVIDER_SITE_OTHER): Payer: Medicare Other

## 2022-07-04 ENCOUNTER — Ambulatory Visit (INDEPENDENT_AMBULATORY_CARE_PROVIDER_SITE_OTHER): Payer: Medicare Other | Admitting: Internal Medicine

## 2022-07-04 DIAGNOSIS — R0602 Shortness of breath: Secondary | ICD-10-CM | POA: Diagnosis not present

## 2022-07-04 DIAGNOSIS — J9611 Chronic respiratory failure with hypoxia: Secondary | ICD-10-CM | POA: Diagnosis not present

## 2022-07-04 DIAGNOSIS — R0609 Other forms of dyspnea: Secondary | ICD-10-CM

## 2022-07-04 NOTE — Progress Notes (Signed)
Danielle Parrish, female    DOB: February 17, 1941   MRN: [id number]   Brief patient profile:  64   yowf never smoker  referred to pulmonary clinic 07/04/2022 by Dr Rexene Alberts  for cough p shoulder injury requiring GA and surgery in Aug of 2022.  [employer] still able to do groceries leaning on cart / mb and back flat x 75 ft with walker   History of Present Illness  07/04/2022  Pulmonary/ 1st office eval/Alexas Basulto  Chief Complaint  Patient presents with   Consult    Pt states she has some SOB and a runny nose x1 year. Pt states she has Oxygen and CPAP machine but she does not use the CPAP machine.   Dyspnea:  mb and back slowly = MMRC3 = can't walk 100 yards even at a slow pace at a flat grade s stopping due to sob   Cough: voice fatigue  Sleep: cpap/02  SABA use: minimal better   No obvious day to day or daytime pattern/variability or assoc excess/ purulent sputum or mucus plugs or hemoptysis or cp or chest tightness, subjective wheeze or overt hb symptoms.   Sleeping  without nocturnal  or early am exacerbation  of respiratory  c/o's or need for noct saba. Also denies any obvious fluctuation of symptoms with weather or environmental changes or other aggravating or alleviating factors except as outlined above   No unusual exposure hx or h/o childhood pna/ asthma or knowledge of premature birth.  Current Allergies, Complete Past Medical History, Past Surgical History, Family History, and Social History were reviewed in [employer] record.  ROS  The following are not active complaints unless bolded Hoarseness, sore throat, dysphagia, dental problems, itching, sneezing,  nasal congestion or discharge of excess mucus or purulent secretions, ear ache,   fever, chills, sweats, unintended wt loss or wt gain, classically pleuritic or exertional cp,  orthopnea pnd or arm/hand swelling  or leg swelling, presyncope, palpitations, abdominal pain, anorexia, nausea, vomiting, diarrhea   or change in bowel habits or change in bladder habits, change in stools or change in urine, dysuria, hematuria,  rash, arthralgias, visual complaints, headache, numbness, weakness or ataxia or problems with walking uses cane or coordination,  change in mood or  memory.           Past Medical History:  Diagnosis Date   Anxiety    Arthritis    CHF (congestive heart failure) (HCC)    Chronic pain of multiple joints    Depression    Essential hypertension    GERD (gastroesophageal reflux disease)    Hyperlipidemia    Hyperlipidemia associated with type 2 diabetes mellitus ([place])    Hypertension associated with type 2 diabetes mellitus ([place])    Hypothyroidism    Mild scoliosis 10/19/2021   Osteoporosis    Sleep apnea    CPAP   Type 2 diabetes mellitus with hypercholesterolemia (HCC)    Vitamin D deficiency     Outpatient Medications Prior to Visit  Medication Sig Dispense Refill   acetaminophen (TYLENOL) 500 MG tablet Take 1 tablet (500 mg total) by mouth every 6 (six) hours as needed. 30 tablet 0   albuterol (VENTOLIN HFA) 108 (90 Base) MCG/ACT inhaler Take 2 puffs eveery 6 hours as needed for wheezing or shortness of breath 6.7 each 3   amLODipine (NORVASC) 10 MG tablet Take 1 tablet (10 mg total) by mouth daily. 90 tablet 3   azelastine (ASTELIN) 0.1 %  nasal spray Place 1 spray into both nostrils 2 (two) times daily. Use in each nostril as directed 30 mL 12   calcium-vitamin D (OSCAL WITH D) 500-200 MG-UNIT TABS tablet TAKE 1 TABLET BY MOUTH EVERY DAY WITH BREAKFAST 90 tablet 1   Cholecalciferol (VITAMIN D-3 PO) Take 1 capsule by mouth daily.      cyclobenzaprine (FLEXERIL) 5 MG tablet Take 1 tablet (5 mg total) by mouth 3 (three) times daily as needed for muscle spasms. 30 tablet 1   diclofenac Sodium (VOLTAREN) 1 % GEL APPLY 4 GRAMS TOPICALLY 4 (FOUR) TIMES DAILY. 400 g 1   diphenhydrAMINE (BENADRYL) 25 MG tablet Take 50 mg by mouth at bedtime as needed for allergies.       erythromycin ophthalmic ointment Place 1 application. into both eyes 4 (four) times daily. 3.5 g 1   Ferrous Sulfate (IRON) 325 (65 Fe) MG TABS Take 1 tablet by mouth 3 (three) times daily.     gabapentin (NEURONTIN) 300 MG capsule TAKE 1 CAPSULE BY MOUTH 3 TIMES DAILY 90 capsule 1   ketoconazole (NIZORAL) 2 % cream APPLY TO AFFECTED AREA EVERYDAY 60 g 0   levothyroxine (SYNTHROID) 100 MCG tablet TAKE 1 TABLET BY MOUTH DAILY BEFORE BREAKFAST. 90 tablet 0   lidocaine (LIDODERM) 5 % PLACE 1 PATCH ONTO THE SKIN DAILY. REMOVE & DISCARD PATCH WITHIN 12 HOURS OR AS DIRECTED BY MD 30 patch 0   lisinopril (ZESTRIL) 20 MG tablet Take 1 tablet (20 mg total) by mouth daily. 90 tablet 3   lovastatin (MEVACOR) 40 MG tablet TAKE 1 TABLET BY MOUTH EVERYDAY AT BEDTIME 90 tablet 3   metoprolol succinate (TOPROL-XL) 25 MG 24 hr tablet Take 1 tablet (25 mg total) by mouth daily. 90 tablet 3   mirabegron ER (MYRBETRIQ) 25 MG TB24 tablet Take 1 tablet (25 mg total) by mouth daily. 30 tablet 3   Multiple Vitamin (MULTI-VITAMIN) tablet Take by mouth.     Multiple Vitamins-Minerals (PRESERVISION AREDS 2 PO) Take by mouth.     nystatin (MYCOSTATIN/NYSTOP) powder APPLY TO AFFECTED AREA 4 TIMES A DAY 60 g 1   omeprazole (PRILOSEC) 20 MG capsule Take 1 capsule (20 mg total) by mouth 2 (two) times daily before a meal. 180 capsule 3   pyridOXINE (VITAMIN B-6) 100 MG tablet Take 100 mg by mouth daily.     sertraline (ZOLOFT) 100 MG tablet TAKE 2 TABLETS BY MOUTH EVERY DAY 180 tablet 3   sodium chloride (OCEAN) 0.65 % SOLN nasal spray Place 1 spray into both nostrils as needed for congestion. 1 Bottle prn   calcitonin, salmon, (MIACALCIN/FORTICAL) 200 UNIT/ACT nasal spray 1 spray every morning.     nitrofurantoin, macrocrystal-monohydrate, (MACROBID) 100 MG capsule Take 100 mg by mouth daily.     No facility-administered medications prior to visit.     Objective:     BP 118/68 (BP Location: Left Arm, Patient Position:  Sitting, Cuff Size: Large)   Pulse (!) 56   Temp 98.4 F (36.9 C) (Oral)   Ht '5\' 3"'  (1.6 m)   Wt 173 lb 3.2 oz (78.6 kg)   SpO2 91% Comment: on RA  BMI 30.68 kg/m   SpO2: 91 % (on RA)  Obese pleasant wf mild voice fatigue   HEENT : Oropharynx  nl      Nasal turbinates nl    NECK :  without  apparent JVD/ palpable Nodes/TM    LUNGS: no acc muscle use,  Nl contour chest  which is clear to A and P bilaterally without cough on insp or exp maneuvers   CV:  RRR  no s3   3/6 SEM   ?  increase in P2, and no edema   ABD:  obese soft and nontender with nl inspiratory excursion in the supine position. No bruits or organomegaly appreciated   MS:  Nl gait/ ext warm without deformities Or obvious joint restrictions  calf tenderness, cyanosis or clubbing    SKIN: warm and dry without lesions    NEURO:  alert, approp, nl sensorium with  no motor or cerebellar deficits apparent.    CXR PA and Lateral:   07/04/2022 :    I personally reviewed images and agree with radiology impression as follows:    CM/ mod severe t kyphosis/ blunting R CP angle chronic   Labs ordered/ reviewed:      Chemistry      Component Value Date/Time   NA 142 06/19/2022 1446   K 4.6 06/19/2022 1446   CL 102 06/19/2022 1446   CO2 24 06/19/2022 1446   BUN 15 06/19/2022 1446   CREATININE 0.63 06/19/2022 1446   CREATININE 0.86 05/15/2013 0941      Component Value Date/Time   CALCIUM 10.6 (H) 06/19/2022 1446   ALKPHOS 76 06/19/2022 1446   AST 14 06/19/2022 1446   ALT 16 06/19/2022 1446   BILITOT 0.4 06/19/2022 1446        Lab Results  Component Value Date   WBC 5.1 07/04/2022   HGB 13.6 07/04/2022   HCT 40.7 07/04/2022   MCV 97.9 07/04/2022   PLT 191.0 07/04/2022      Lab Results  Component Value Date   TSH 1.350 06/19/2022     Lab Results  Component Value Date   PROBNP 50.0 07/04/2022       Lab Results  Component Value Date   ESRSEDRATE 7 07/04/2022   ESRSEDRATE 17 05/24/2017    ESRSEDRATE 18 10/22/2015          Assessment   DOE (dyspnea on exertion) DDX of  difficult airways management almost all start with A and  include Adherence, Ace Inhibitors, Acid Reflux, Active Sinus Disease, Alpha 1 Antitripsin deficiency, Anxiety masquerading as Airways dz,  ABPA,  Allergy(esp in young), Aspiration (esp in elderly), Adverse effects of meds,  Active smoking or vaping, A bunch of PE's (a small clot burden can't cause this syndrome unless there is already severe underlying pulm or vascular dz with poor reserve) plus two Bs  = Bronchiectasis and Beta blocker use..and one C= CHF   Adherence is always the initial "prime suspect" and is a multilayered concern that requires a "trust but verify" approach in every patient - starting with knowing how to use medications, especially inhalers, correctly, keeping up with refills and understanding the fundamental difference between maintenance and prns vs those medications only taken for a very short course and then stopped and not refilled.  - return to office with all meds in hand using a trust but verify approach to confirm accurate Medication  Reconciliation The principal here is that until we are certain that the  patients are doing what we've asked, it makes no sense to ask them to do more.   ? acei case > may be contributing raspy voice but would not be cause of desats   ? Adverse drug effects > stop macrodantin until sort this issue out - low esr is reassuring   ? Allergy/ asthma >  nothing to suggest  ? A bunch of PEs > needs  D dimer  ? chf > prominent SEM > bnp reassuring, echo pending.   Chronic respiratory failure with hypoxia (HCC) desats on ambulation 07/04/2022 > rec titrate to keep > 90% pending w/u   Specifically instructed Make sure you check your oxygen saturation  AT  your highest level of activity (not after you stop)   to be sure it stays over 90% and adjust  02 flow upward to maintain this level if needed but  remember to turn it back to previous settings when you stop (to conserve your supply).    Each maintenance medication was reviewed in detail including emphasizing most importantly the difference between maintenance and prns and under what circumstances the prns are to be triggered using an action plan format where appropriate.  Total time for H and P, chart review, counseling,  directly observing portions of ambulatory 02 saturation study/ and generating customized AVS unique to this office visit / same day charting >45 min for pt new to me with refractory respiratory  symptoms of uncertain etiology                    Christinia Gully, MD 07/10/2022

## 2022-07-04 NOTE — Patient Instructions (Addendum)
You will need to get the echocardiogram asap - call this office if can't schedule it within  a week   Stop macrodantin   Please remember to go to the lab and x-ray department  for your tests - we will call you with the results when they are available.       Please schedule a follow up office visit in 3- 4 weeks, sooner if needed  - [facility]  - bring all medications  Late add: pt instructed on use of amb 02 = goal is to keep > 90% saturations at all times

## 2022-07-05 LAB — CBC WITH DIFFERENTIAL/PLATELET
Basophils Absolute: 0 10*3/uL (ref 0.0–0.1)
Basophils Relative: 1 % (ref 0.0–3.0)
Eosinophils Absolute: 0.1 10*3/uL (ref 0.0–0.7)
Eosinophils Relative: 1.9 % (ref 0.0–5.0)
HCT: 40.7 % (ref 36.0–46.0)
Hemoglobin: 13.6 g/dL (ref 12.0–15.0)
Lymphocytes Relative: 48.5 % — ABNORMAL HIGH (ref 12.0–46.0)
Lymphs Abs: 2.5 10*3/uL (ref 0.7–4.0)
MCHC: 33.3 g/dL (ref 30.0–36.0)
MCV: 97.9 fl (ref 78.0–100.0)
Monocytes Absolute: 0.4 10*3/uL (ref 0.1–1.0)
Monocytes Relative: 8.6 % (ref 3.0–12.0)
Neutro Abs: 2 10*3/uL (ref 1.4–7.7)
Neutrophils Relative %: 40 % — ABNORMAL LOW (ref 43.0–77.0)
Platelets: 191 10*3/uL (ref 150.0–400.0)
RBC: 4.16 Mil/uL (ref 3.87–5.11)
RDW: 12.8 % (ref 11.5–15.5)
WBC: 5.1 10*3/uL (ref 4.0–10.5)

## 2022-07-05 LAB — SEDIMENTATION RATE: Sed Rate: 7 mm/hr (ref 0–30)

## 2022-07-05 LAB — BRAIN NATRIURETIC PEPTIDE: Pro B Natriuretic peptide (BNP): 50 pg/mL (ref 0.0–100.0)

## 2022-07-05 LAB — IGE: IgE (Immunoglobulin E), Serum: 53 kU/L (ref <=114)

## 2022-07-07 NOTE — Progress Notes (Signed)
Spoke with pt and notified of results per Dr. Wert. Pt verbalized understanding and denied any questions. 

## 2022-07-07 NOTE — Progress Notes (Signed)
Pt notified of results

## 2022-07-10 ENCOUNTER — Telehealth: Payer: Self-pay | Admitting: Internal Medicine

## 2022-07-10 ENCOUNTER — Encounter: Payer: Self-pay | Admitting: Internal Medicine

## 2022-07-10 DIAGNOSIS — J9611 Chronic respiratory failure with hypoxia: Secondary | ICD-10-CM | POA: Insufficient documentation

## 2022-07-10 NOTE — Assessment & Plan Note (Signed)
desats on ambulation 07/04/2022 > rec titrate to keep > 90% pending w/u   Specifically instructed Make sure you check your oxygen saturation  AT  your highest level of activity (not after you stop)   to be sure it stays over 90% and adjust  02 flow upward to maintain this level if needed but remember to turn it back to previous settings when you stop (to conserve your supply).    Each maintenance medication was reviewed in detail including emphasizing most importantly the difference between maintenance and prns and under what circumstances the prns are to be triggered using an action plan format where appropriate.  Total time for H and P, chart review, counseling,  directly observing portions of ambulatory 02 saturation study/ and generating customized AVS unique to this office visit / same day charting >45 min for pt new to me with refractory respiratory  symptoms of uncertain etiology

## 2022-07-10 NOTE — Telephone Encounter (Signed)
After chart/ xray/ lab review need to confirm has echo asap this week along with d dimer in [place] office dx doe

## 2022-07-10 NOTE — Assessment & Plan Note (Addendum)
DDX of  difficult airways management almost all start with A and  include Adherence, Ace Inhibitors, Acid Reflux, Active Sinus Disease, Alpha 1 Antitripsin deficiency, Anxiety masquerading as Airways dz,  ABPA,  Allergy(esp in young), Aspiration (esp in elderly), Adverse effects of meds,  Active smoking or vaping, A bunch of PE's (a small clot burden can't cause this syndrome unless there is already severe underlying pulm or vascular dz with poor reserve) plus two Bs  = Bronchiectasis and Beta blocker use..and one C= CHF   Adherence is always the initial "prime suspect" and is a multilayered concern that requires a "trust but verify" approach in every patient - starting with knowing how to use medications, especially inhalers, correctly, keeping up with refills and understanding the fundamental difference between maintenance and prns vs those medications only taken for a very short course and then stopped and not refilled.  - return to office with all meds in hand using a trust but verify approach to confirm accurate Medication  Reconciliation The principal here is that until we are certain that the  patients are doing what we've asked, it makes no sense to ask them to do more.   ? acei case > may be contributing raspy voice but would not be cause of desats   ? Adverse drug effects > stop macrodantin until sort this issue out - low esr is reassuring   ? Allergy/ asthma > nothing to suggest  ? A bunch of PEs > needs  D dimer  ? chf > prominent SEM > bnp reassuring, echo pending.

## 2022-07-11 ENCOUNTER — Ambulatory Visit: Payer: Medicare Other

## 2022-07-11 DIAGNOSIS — R0609 Other forms of dyspnea: Secondary | ICD-10-CM

## 2022-07-11 LAB — ECHOCARDIOGRAM COMPLETE
AR max vel: 1.94 cm2
AV Area VTI: 1.86 cm2
AV Area mean vel: 1.87 cm2
AV Mean grad: 9 mmHg
AV Peak grad: 16.2 mmHg
Ao pk vel: 2.02 m/s
Area-P 1/2: 2.84 cm2
Calc EF: 63 %
P 1/2 time: 1034 msec
S' Lateral: 2.96 cm
Single Plane A2C EF: 65.2 %
Single Plane A4C EF: 60.5 %

## 2022-07-11 NOTE — Telephone Encounter (Signed)
Called and spoke to [place] with [facility] where Echo is scheduled this am for 8:30.  She states [facility] does not do bubble studies and they will have to reschedule at [facility] but patient likely wont get in for echo this week. They are going to proceed with echo today without bubble study.

## 2022-07-11 NOTE — Telephone Encounter (Signed)
See prior message - add bubble study to Echo

## 2022-07-11 NOTE — Telephone Encounter (Signed)
ATC patient. No answer and no vm available will attempt to reach patient again at a later time.

## 2022-07-11 NOTE — Telephone Encounter (Signed)
Atc patient. Line was busy X3. ATC patients daughter Marita [place]. LVM asking her to call back.

## 2022-07-12 DIAGNOSIS — G4733 Obstructive sleep apnea (adult) (pediatric): Secondary | ICD-10-CM | POA: Diagnosis not present

## 2022-07-13 NOTE — Telephone Encounter (Signed)
Pt returned call. Relayed to her the results of the echo and she verbalized understanding. Nothing further needed.

## 2022-07-13 NOTE — Progress Notes (Signed)
Tried calling the pt and there was no answer and no option to leave msg, wcb.

## 2022-07-17 DIAGNOSIS — R0902 Hypoxemia: Secondary | ICD-10-CM | POA: Diagnosis not present

## 2022-07-24 ENCOUNTER — Telehealth: Payer: Self-pay | Admitting: Family Medicine

## 2022-07-24 DIAGNOSIS — M1711 Unilateral primary osteoarthritis, right knee: Secondary | ICD-10-CM

## 2022-07-24 DIAGNOSIS — M5136 Other intervertebral disc degeneration, lumbar region: Secondary | ICD-10-CM

## 2022-07-24 DIAGNOSIS — G8929 Other chronic pain: Secondary | ICD-10-CM

## 2022-07-24 NOTE — Telephone Encounter (Signed)
REFERRAL REQUEST Telephone Note  Have you been seen at our office for this problem? YES (Advise that they may need an appointment with their PCP before a referral can be done)  Reason for Referral: Needs referral for PT for shoulder, back, and knees Referral discussed with patient: YES  Best contact number of patient for referral team: [phone]    Has patient been seen by a specialist for this issue before: YES  Patient provider preference for referral: [facility] Patient location preference for referral: [facility]   Patient notified that referrals can take up to a week or longer to process. If they haven't heard anything within a week they should call back and speak with the referral department.

## 2022-07-26 NOTE — Telephone Encounter (Signed)
Placed referral to physical therapy for the patient

## 2022-07-26 NOTE — Telephone Encounter (Signed)
Pts daughter, Steffanie Dunn made aware.

## 2022-08-02 ENCOUNTER — Ambulatory Visit (INDEPENDENT_AMBULATORY_CARE_PROVIDER_SITE_OTHER): Payer: Medicare Other

## 2022-08-02 VITALS — Wt 172.0 lb

## 2022-08-02 DIAGNOSIS — Z0000 Encounter for general adult medical examination without abnormal findings: Secondary | ICD-10-CM | POA: Diagnosis not present

## 2022-08-02 NOTE — Patient Instructions (Signed)
[name] , Thank you for taking time to come for your Medicare Wellness Visit. I appreciate your ongoing commitment to your health goals. Please review the following plan we discussed and let me know if I can assist you in the future.   Screening recommendations/referrals: Colonoscopy: Done 03/21/2016 - no repeat required Mammogram: Done 03/03/2022 - Repeat annually  Bone Density: Done 10/28/2018 - recommend Repeat every 2 years *due Recommended yearly ophthalmology/optometry visit for glaucoma screening and checkup Recommended yearly dental visit for hygiene and checkup  Vaccinations: Influenza vaccine: Done 12/13/2021 - Repeat annually  Pneumococcal vaccine: Done 12/26/2013 & 04/08/2015   Tdap vaccine: Done 10/15/2013 - Repeat in 10 years  Shingles vaccine: Done  08/06/2019 & 12/02/2019 Covid-19:Done 01/17/2020, 02/14/2020, 10/29/2020, 02/17/2022  Advanced directives: Please bring a copy of your health care power of attorney and living will to the office to be added to your chart at your convenience.   Conditions/risks identified: Aim for 30 minutes of exercise or brisk walking, 6-8 glasses of water, and 5 servings of fruits and vegetables each day.   Next appointment: Follow up in one year for your annual wellness visit    Preventive Care 65 Years and Older, Female Preventive care refers to lifestyle choices and visits with your health care provider that can promote health and wellness. What does preventive care include? A yearly physical exam. This is also called an annual well check. Dental exams once or twice a year. Routine eye exams. Ask your health care provider how often you should have your eyes checked. Personal lifestyle choices, including: Daily care of your teeth and gums. Regular physical activity. Eating a healthy diet. Avoiding tobacco and drug use. Limiting alcohol use. Practicing safe sex. Taking low-dose aspirin every day. Taking vitamin and mineral supplements as  recommended by your health care provider. What happens during an annual well check? The services and screenings done by your health care provider during your annual well check will depend on your age, overall health, lifestyle risk factors, and family history of disease. Counseling  Your health care provider may ask you questions about your: Alcohol use. Tobacco use. Drug use. Emotional well-being. Home and relationship well-being. Sexual activity. Eating habits. History of falls. Memory and ability to understand (cognition). Work and work Statistician. Reproductive health. Screening  You may have the following tests or measurements: Height, weight, and BMI. Blood pressure. Lipid and cholesterol levels. These may be checked every 5 years, or more frequently if you are over 81 years old. Skin check. Lung cancer screening. You may have this screening every year starting at age 40 if you have a 30-pack-year history of smoking and currently smoke or have quit within the past 15 years. Fecal occult blood test (FOBT) of the stool. You may have this test every year starting at age 79. Flexible sigmoidoscopy or colonoscopy. You may have a sigmoidoscopy every 5 years or a colonoscopy every 10 years starting at age 62. Hepatitis C blood test. Hepatitis B blood test. Sexually transmitted disease (STD) testing. Diabetes screening. This is done by checking your blood sugar (glucose) after you have not eaten for a while (fasting). You may have this done every 1-3 years. Bone density scan. This is done to screen for osteoporosis. You may have this done starting at age 50. Mammogram. This may be done every 1-2 years. Talk to your health care provider about how often you should have regular mammograms. Talk with your health care provider about your test results, treatment  options, and if necessary, the need for more tests. Vaccines  Your health care provider may recommend certain vaccines, such  as: Influenza vaccine. This is recommended every year. Tetanus, diphtheria, and acellular pertussis (Tdap, Td) vaccine. You may need a Td booster every 10 years. Zoster vaccine. You may need this after age 89. Pneumococcal 13-valent conjugate (PCV13) vaccine. One dose is recommended after age 74. Pneumococcal polysaccharide (PPSV23) vaccine. One dose is recommended after age 50. Talk to your health care provider about which screenings and vaccines you need and how often you need them. This information is not intended to replace advice given to you by your health care provider. Make sure you discuss any questions you have with your health care provider. Document Released: 01/07/2016 Document Revised: 08/30/2016 Document Reviewed: 10/12/2015 Elsevier Interactive Patient Education  2017 [place] Prevention in the Home Falls can cause injuries. They can happen to people of all ages. There are many things you can do to make your home safe and to help prevent falls. What can I do on the outside of my home? Regularly fix the edges of walkways and driveways and fix any cracks. Remove anything that might make you trip as you walk through a door, such as a raised step or threshold. Trim any bushes or trees on the path to your home. Use bright outdoor lighting. Clear any walking paths of anything that might make someone trip, such as rocks or tools. Regularly check to see if handrails are loose or broken. Make sure that both sides of any steps have handrails. Any raised decks and porches should have guardrails on the edges. Have any leaves, snow, or ice cleared regularly. Use sand or salt on walking paths during winter. Clean up any spills in your garage right away. This includes oil or grease spills. What can I do in the bathroom? Use night lights. Install grab bars by the toilet and in the tub and shower. Do not use towel bars as grab bars. Use non-skid mats or decals in the tub or  shower. If you need to sit down in the shower, use a plastic, non-slip stool. Keep the floor dry. Clean up any water that spills on the floor as soon as it happens. Remove soap buildup in the tub or shower regularly. Attach bath mats securely with double-sided non-slip rug tape. Do not have throw rugs and other things on the floor that can make you trip. What can I do in the bedroom? Use night lights. Make sure that you have a light by your bed that is easy to reach. Do not use any sheets or blankets that are too big for your bed. They should not hang down onto the floor. Have a firm chair that has side arms. You can use this for support while you get dressed. Do not have throw rugs and other things on the floor that can make you trip. What can I do in the kitchen? Clean up any spills right away. Avoid walking on wet floors. Keep items that you use a lot in easy-to-reach places. If you need to reach something above you, use a strong step stool that has a grab bar. Keep electrical cords out of the way. Do not use floor polish or wax that makes floors slippery. If you must use wax, use non-skid floor wax. Do not have throw rugs and other things on the floor that can make you trip. What can I do with my stairs? Do not  leave any items on the stairs. Make sure that there are handrails on both sides of the stairs and use them. Fix handrails that are broken or loose. Make sure that handrails are as long as the stairways. Check any carpeting to make sure that it is firmly attached to the stairs. Fix any carpet that is loose or worn. Avoid having throw rugs at the top or bottom of the stairs. If you do have throw rugs, attach them to the floor with carpet tape. Make sure that you have a light switch at the top of the stairs and the bottom of the stairs. If you do not have them, ask someone to add them for you. What else can I do to help prevent falls? Wear shoes that: Do not have high heels. Have  rubber bottoms. Are comfortable and fit you well. Are closed at the toe. Do not wear sandals. If you use a stepladder: Make sure that it is fully opened. Do not climb a closed stepladder. Make sure that both sides of the stepladder are locked into place. Ask someone to hold it for you, if possible. Clearly mark and make sure that you can see: Any grab bars or handrails. First and last steps. Where the edge of each step is. Use tools that help you move around (mobility aids) if they are needed. These include: Canes. Walkers. Scooters. Crutches. Turn on the lights when you go into a dark area. Replace any light bulbs as soon as they burn out. Set up your furniture so you have a clear path. Avoid moving your furniture around. If any of your floors are uneven, fix them. If there are any pets around you, be aware of where they are. Review your medicines with your doctor. Some medicines can make you feel dizzy. This can increase your chance of falling. Ask your doctor what other things that you can do to help prevent falls. This information is not intended to replace advice given to you by your health care provider. Make sure you discuss any questions you have with your health care provider. Document Released: 10/07/2009 Document Revised: 05/18/2016 Document Reviewed: 01/15/2015 Elsevier Interactive Patient Education  2017 [employer].

## 2022-08-02 NOTE — Progress Notes (Signed)
Subjective:   Danielle Parrish is a 81 y.o. female who presents for Medicare Annual (Subsequent) preventive examination.  Virtual Visit via Telephone Note  I connected with  Danielle Parrish on 08/02/22 at  8:15 AM EDT by telephone and verified that I am speaking with the correct person using two identifiers.  Location: Patient: Home Provider: [facility] Persons participating in the virtual visit: patient/Nurse Health Advisor   I discussed the limitations, risks, security and privacy concerns of performing an evaluation and management service by telephone and the availability of in person appointments. The patient expressed understanding and agreed to proceed.  Interactive audio and video telecommunications were attempted between this nurse and patient, however failed, due to patient having technical difficulties OR patient did not have access to video capability.  We continued and completed visit with audio only.  Some vital signs may be absent or patient reported.   Marisah Laker E Johnnie Goynes, LPN   Review of Systems     Cardiac Risk Factors include: advanced age (>3mn, >>7women);diabetes mellitus;dyslipidemia;hypertension;sedentary lifestyle;obesity (BMI >30kg/m2);Other (see comment), Risk factor comments: OSA, chronic respiratory failure, CHF     Objective:    Today's Vitals   08/02/22 0806  Weight: 172 lb (78 kg)   Body mass index is 30.47 kg/m.     08/02/2022    8:12 AM 08/01/2021    8:31 AM 05/15/2021    1:00 PM 05/03/2021    2:28 PM 07/28/2020    8:48 AM 06/14/2020    9:44 PM 10/22/2019    1:23 PM  Advanced Directives  Does Patient Have a Medical Advance Directive? No No No No No No No  Would patient like information on creating a medical advance directive? No - Patient declined No - Patient declined No - Patient declined  No - Patient declined  No - Patient declined    Current Medications (verified) Outpatient Encounter Medications as of 08/02/2022  Medication Sig    acetaminophen (TYLENOL) 500 MG tablet Take 1 tablet (500 mg total) by mouth every 6 (six) hours as needed.   albuterol (VENTOLIN HFA) 108 (90 Base) MCG/ACT inhaler Take 2 puffs eveery 6 hours as needed for wheezing or shortness of breath   amLODipine (NORVASC) 10 MG tablet Take 1 tablet (10 mg total) by mouth daily.   azelastine (ASTELIN) 0.1 % nasal spray Place 1 spray into both nostrils 2 (two) times daily. Use in each nostril as directed   calcium-vitamin D (OSCAL WITH D) 500-200 MG-UNIT TABS tablet TAKE 1 TABLET BY MOUTH EVERY DAY WITH BREAKFAST   Cholecalciferol (VITAMIN D-3 PO) Take 1 capsule by mouth daily.    cyclobenzaprine (FLEXERIL) 5 MG tablet Take 1 tablet (5 mg total) by mouth 3 (three) times daily as needed for muscle spasms.   diclofenac Sodium (VOLTAREN) 1 % GEL APPLY 4 GRAMS TOPICALLY 4 (FOUR) TIMES DAILY.   diphenhydrAMINE (BENADRYL) 25 MG tablet Take 50 mg by mouth at bedtime as needed for allergies.    erythromycin ophthalmic ointment Place 1 application. into both eyes 4 (four) times daily.   Ferrous Sulfate (IRON) 325 (65 Fe) MG TABS Take 1 tablet by mouth 3 (three) times daily.   gabapentin (NEURONTIN) 300 MG capsule TAKE 1 CAPSULE BY MOUTH 3 TIMES DAILY   ketoconazole (NIZORAL) 2 % cream APPLY TO AFFECTED AREA EVERYDAY   levothyroxine (SYNTHROID) 100 MCG tablet TAKE 1 TABLET BY MOUTH DAILY BEFORE BREAKFAST.   lidocaine (LIDODERM) 5 % PLACE 1 PATCH ONTO THE SKIN DAILY.  REMOVE & DISCARD PATCH WITHIN 12 HOURS OR AS DIRECTED BY MD   lisinopril (ZESTRIL) 20 MG tablet Take 1 tablet (20 mg total) by mouth daily.   lovastatin (MEVACOR) 40 MG tablet TAKE 1 TABLET BY MOUTH EVERYDAY AT BEDTIME   metoprolol succinate (TOPROL-XL) 25 MG 24 hr tablet Take 1 tablet (25 mg total) by mouth daily.   mirabegron ER (MYRBETRIQ) 25 MG TB24 tablet Take 1 tablet (25 mg total) by mouth daily.   Multiple Vitamin (MULTI-VITAMIN) tablet Take by mouth.   Multiple Vitamins-Minerals (PRESERVISION AREDS  2 PO) Take by mouth.   nystatin (MYCOSTATIN/NYSTOP) powder APPLY TO AFFECTED AREA 4 TIMES A DAY   omeprazole (PRILOSEC) 20 MG capsule Take 1 capsule (20 mg total) by mouth 2 (two) times daily before a meal.   pyridOXINE (VITAMIN B-6) 100 MG tablet Take 100 mg by mouth daily.   sertraline (ZOLOFT) 100 MG tablet TAKE 2 TABLETS BY MOUTH EVERY DAY   sodium chloride (OCEAN) 0.65 % SOLN nasal spray Place 1 spray into both nostrils as needed for congestion.   No facility-administered encounter medications on file as of 08/02/2022.    Allergies (verified) Crestor [rosuvastatin], Nsaids, Vytorin [ezetimibe-simvastatin], Aspirin, and Tolmetin   History: Past Medical History:  Diagnosis Date   Anxiety    Arthritis    CHF (congestive heart failure) (HCC)    Chronic pain of multiple joints    Depression    Essential hypertension    GERD (gastroesophageal reflux disease)    Hyperlipidemia    Hyperlipidemia associated with type 2 diabetes mellitus ([place])    Hypertension associated with type 2 diabetes mellitus ([place])    Hypothyroidism    Mild scoliosis 10/19/2021   Osteoporosis    Sleep apnea    CPAP   Type 2 diabetes mellitus with hypercholesterolemia ([place])    Vitamin D deficiency    Past Surgical History:  Procedure Laterality Date   ABDOMINAL HERNIA REPAIR     ABDOMINAL HYSTERECTOMY  1977   BREAST EXCISIONAL BIOPSY Right    EYE SURGERY  2020   bilateral cataract removal    KNEE ARTHROSCOPY Left 09/30/2014   Procedure: LEFT KNEE ARTHROSCOPY MEDIAL MENISECTOMY ABRASION CHONDROPLASTY SYNOVECTOMY SUPRAPATELLER CUFF;  Surgeon: Tobi Bastos, MD;  Location: [facility];  Service: Orthopedics;  Laterality: Left;   Family History  Problem Relation Age of Onset   Hypertension Mother    Stroke Mother    Cancer Father    Social History   Socioeconomic History   Marital status: Widowed    Spouse name: Laverna Peace   Number of children: 2   Years of education: Not on file   Highest education level:  High school graduate  Occupational History   Occupation: CNA    Comment: [employer]   Occupation: retired  Tobacco Use   Smoking status: Never    Passive exposure: Past   Smokeless tobacco: Never  Vaping Use   Vaping Use: Never used  Substance and Sexual Activity   Alcohol use: No   Drug use: No   Sexual activity: Not Currently  Other Topics Concern   Not on file  Social History Narrative   Lives with her daughter   Not very active socially or physically   Social Determinants of Health   Financial Resource Strain: Low Risk  (08/02/2022)   Overall Financial Resource Strain (CARDIA)    Difficulty of Paying Living Expenses: Not very hard  Food Insecurity: No Food Insecurity (08/02/2022)   Hunger Vital  Sign    Worried About Charity fundraiser in the Last Year: Never true    [place] in the Last Year: Never true  Transportation Needs: No Transportation Needs (08/02/2022)   PRAPARE - Hydrologist (Medical): No    Lack of Transportation (Non-Medical): No  Physical Activity: Inactive (08/02/2022)   Exercise Vital Sign    Days of Exercise per Week: 0 days    Minutes of Exercise per Session: 0 min  Stress: No Stress Concern Present (08/02/2022)   [place]    Feeling of Stress : Not at all  Social Connections: Socially Isolated (08/02/2022)   Social Connection and Isolation Panel [NHANES]    Frequency of Communication with Friends and Family: More than three times a week    Frequency of Social Gatherings with Friends and Family: More than three times a week    Attends Religious Services: Never    Marine scientist or Organizations: No    Attends Archivist Meetings: Never    Marital Status: Widowed    Tobacco Counseling Counseling given: Not Answered   Clinical Intake:  Pre-visit preparation completed: Yes  Pain : No/denies pain     BMI - recorded:  30.47 Nutritional Status: BMI > 30  Obese Nutritional Risks: None Diabetes: Yes CBG done?: No Did pt. bring in CBG monitor from home?: No  How often do you need to have someone help you when you read instructions, pamphlets, or other written materials from your doctor or pharmacy?: 1 - Never  Diabetic? Nutrition Risk Assessment:  Has the patient had any N/V/D within the last 2 months?  No  Does the patient have any non-healing wounds?  No  Has the patient had any unintentional weight loss or weight gain?  No   Diabetes:  Is the patient diabetic?  Yes  If diabetic, was a CBG obtained today?  No  Did the patient bring in their glucometer from home?  No  How often do you monitor your CBG's? never.   Financial Strains and Diabetes Management:  Are you having any financial strains with the device, your supplies or your medication? No .  Does the patient want to be seen by Chronic Care Management for management of their diabetes?  No  Would the patient like to be referred to a Nutritionist or for Diabetic Management?  No   Diabetic Exams:  Diabetic Eye Exam: Completed 12/07/2021.   Diabetic Foot Exam: Completed 04/21/2021. Pt has been advised about the importance in completing this exam. Pt is scheduled for diabetic foot exam on next visit with PCP.    Interpreter Needed?: No  Information entered by :: Reet Scharrer, LPN   Activities of Daily Living    08/02/2022   11:39 AM  In your present state of health, do you have any difficulty performing the following activities:  Hearing? 1  Comment wears hearing aids  Vision? 0  Difficulty concentrating or making decisions? 0  Walking or climbing stairs? 1  Dressing or bathing? 0  Doing errands, shopping? 0  Preparing Food and eating ? N  Using the Toilet? N  In the past six months, have you accidently leaked urine? Y  Do you have problems with loss of bowel control? N  Managing your Medications? N  Managing your Finances? N   Housekeeping or managing your Housekeeping? Y    Patient Care Team: Detting[place],  Fransisca Kaufmann, MD as PCP - General (Family Medicine) Sandford Craze, MD as Referring Physician (Dermatology) Calvert Cantor, MD as Consulting Physician (Ophthalmology) Steffanie Rainwater, DPM as Consulting Physician (Podiatry) Latanya Maudlin, MD as Consulting Physician (Orthopedic Surgery)  Indicate any recent Medical Services you may have received from other than [facility] providers in the past year (date may be approximate).     Assessment:   This is a routine wellness examination for [place].  Hearing/Vision screen Hearing Screening - Comments:: Wears hearing aids - from [facility] in [place] - Comments:: Wears rx glasses - up to date with routine eye exams with  [facility]  Dietary issues and exercise activities discussed: Current Exercise Habits: The patient does not participate in regular exercise at present, Exercise limited by: orthopedic condition(s);respiratory conditions(s)   Goals Addressed             This Visit's Progress    Patient Stated       Hopes to get more active and feel better       Depression Screen    08/02/2022   10:58 AM 06/19/2022    1:58 PM 03/29/2022    4:12 PM 02/01/2022   12:32 PM 10/28/2021    3:54 PM 10/17/2021   11:53 AM 09/14/2021    2:11 PM  PHQ 2/9 Scores  PHQ - 2 Score '4 3 2 2 '$ 0 2 2  PHQ- 9 Score '14 13 6 13 '$ 0 6 8    Fall Risk    08/02/2022    8:08 AM 06/19/2022    1:58 PM 03/29/2022    4:13 PM 02/01/2022   12:33 PM 10/28/2021    3:53 PM  [place] in the past year? 0 0 1 1 0  Number falls in past yr: 0 0 1 1   Injury with Fall? 0 '1 1 1   '$ Risk for fall due to : Impaired balance/gait;Orthopedic patient Impaired balance/gait     Follow up Education provided;Falls prevention discussed Falls evaluation completed       FALL RISK PREVENTION PERTAINING TO THE HOME:  Any stairs in or around the home? Yes  If so, are there any without handrails? No   Home free of loose throw rugs in walkways, pet beds, electrical cords, etc? Yes  Adequate lighting in your home to reduce risk of falls? Yes   ASSISTIVE DEVICES UTILIZED TO PREVENT FALLS:  Life alert? No  Use of a cane, walker or w/c? Yes  Grab bars in the bathroom? Yes  Shower chair or bench in shower? Yes  Elevated toilet seat or a handicapped toilet? Yes   TIMED UP AND GO:  Was the test performed? No . Telephonic visit  Cognitive Function:    07/23/2018    8:40 AM  MMSE - Mini Mental State Exam  Orientation to time 5  Orientation to Place 5  Registration 3  Attention/ Calculation 4  Recall 2  Language- name 2 objects 2  Language- repeat 1  Language- follow 3 step command 3  Language- read & follow direction 1  Write a sentence 1  Copy design 1  Total score 28        08/02/2022   11:40 AM 08/01/2021    8:24 AM 07/28/2020    8:55 AM 07/28/2019    8:43 AM  6CIT Screen  What Year? 0 points 0 points 0 points 0 points  What month? 0 points 0 points 0 points 0 points  What time? 0 points 0 points 0 points 0 points  Count back from 20 0 points 0 points 0 points 2 points  Months in reverse 0 points 2 points 0 points 0 points  Repeat phrase 0 points 0 points 0 points 0 points  Total Score 0 points 2 points 0 points 2 points    Immunizations Immunization History  Administered Date(s) Administered   Influenza, High Dose Seasonal PF 11/14/2017, 10/01/2018, 12/13/2021   Influenza, Seasonal, Injecte, Preservative Fre 10/15/2013, 11/17/2014, 09/23/2015, 09/14/2016   Influenza,inj,Quad PF,6+ Mos 10/15/2013, 11/17/2014, 09/23/2015, 09/14/2016   Influenza-Unspecified 10/22/2019   Moderna Covid-19 Vaccine Bivalent Booster 20yr & up 02/17/2022   Moderna Sars-Covid-2 Vaccination 01/17/2020, 02/14/2020, 10/29/2020   Pneumococcal Conjugate-13 04/08/2015   Pneumococcal-Unspecified 12/26/2013   Tdap 10/15/2013   Zoster Recombinat (Shingrix) 08/06/2019, 12/02/2019   Zoster, Live  12/26/2013    TDAP status: Up to date  Flu Vaccine status: Up to date  Pneumococcal vaccine status: Up to date  Covid-19 vaccine status: Completed vaccines  Qualifies for Shingles Vaccine? Yes   Zostavax completed Yes   Shingrix Completed?: Yes  Screening Tests Health Maintenance  Topic Date Due   DEXA SCAN  10/28/2020   FOOT EXAM  04/21/2022   INFLUENZA VACCINE  07/25/2022   Pneumonia Vaccine 82 Years old (2 - PPSV23 or PCV20) 10/17/2022 (Originally 04/07/2016)   OPHTHALMOLOGY EXAM  12/07/2022   HEMOGLOBIN A1C  12/19/2022   MAMMOGRAM  03/04/2023   TETANUS/TDAP  10/16/2023   COVID-19 Vaccine  Completed   Zoster Vaccines- Shingrix  Completed   HPV VACCINES  Aged Out    Health Maintenance  Health Maintenance Due  Topic Date Due   DEXA SCAN  10/28/2020   FOOT EXAM  04/21/2022   INFLUENZA VACCINE  07/25/2022    Colorectal cancer screening: No longer required.   Mammogram status: Completed 03/03/2022. Repeat every year  Bone Density status: Completed 10/28/2018. Results reflect: Bone density results: OSTEOPOROSIS. Repeat every 2 years.  Lung Cancer Screening: (Low Dose CT Chest recommended if Age 82-80years, 30 pack-year currently smoking OR have quit w/in 15years.) does not qualify  Additional Screening:  Hepatitis C Screening: does not qualify  Vision Screening: Recommended annual ophthalmology exams for early detection of glaucoma and other disorders of the eye. Is the patient up to date with their annual eye exam?  Yes  Who is the provider or what is the name of the office in which the patient attends annual eye exams? [facility] pt is not established with a provider, would they like to be referred to a provider to establish care? No .   Dental Screening: Recommended annual dental exams for proper oral hygiene  Community Resource Referral / Chronic Care Management: CRR required this visit?  No   CCM required this visit?  No      Plan:     I have  personally reviewed and noted the following in the patient's chart:   Medical and social history Use of alcohol, tobacco or illicit drugs  Current medications and supplements including opioid prescriptions.  Functional ability and status Nutritional status Physical activity Advanced directives List of other physicians Hospitalizations, surgeries, and ER visits in previous 12 months Vitals Screenings to include cognitive, depression, and falls Referrals and appointments  In addition, I have reviewed and discussed with patient certain preventive protocols, quality metrics, and best practice recommendations. A written personalized care plan for preventive services as well as general preventive health recommendations were provided to patient.  Sandrea Hammond, LPN   01/27/9531   Nurse Notes: None

## 2022-08-15 ENCOUNTER — Ambulatory Visit: Payer: Medicare Other | Attending: Family Medicine | Admitting: Physical Therapy

## 2022-08-17 DIAGNOSIS — R0902 Hypoxemia: Secondary | ICD-10-CM | POA: Diagnosis not present

## 2022-08-21 ENCOUNTER — Ambulatory Visit: Payer: Medicare Other | Admitting: Family Medicine

## 2022-08-22 ENCOUNTER — Encounter: Payer: Self-pay | Admitting: Internal Medicine

## 2022-08-22 ENCOUNTER — Ambulatory Visit (INDEPENDENT_AMBULATORY_CARE_PROVIDER_SITE_OTHER): Payer: Medicare Other | Admitting: Internal Medicine

## 2022-08-22 ENCOUNTER — Encounter: Payer: Self-pay | Admitting: Family Medicine

## 2022-08-22 DIAGNOSIS — E1159 Type 2 diabetes mellitus with other circulatory complications: Secondary | ICD-10-CM | POA: Diagnosis not present

## 2022-08-22 DIAGNOSIS — J9611 Chronic respiratory failure with hypoxia: Secondary | ICD-10-CM

## 2022-08-22 DIAGNOSIS — R0609 Other forms of dyspnea: Secondary | ICD-10-CM

## 2022-08-22 DIAGNOSIS — I152 Hypertension secondary to endocrine disorders: Secondary | ICD-10-CM | POA: Diagnosis not present

## 2022-08-22 MED ORDER — OLMESARTAN MEDOXOMIL 20 MG PO TABS
20.0000 mg | ORAL_TABLET | Freq: Every day | ORAL | 2 refills | Status: DC
Start: 2022-08-22 — End: 2022-11-27

## 2022-08-22 NOTE — Assessment & Plan Note (Signed)
Onset Aug 2022  p GA for L shoulder surgery while on chronic macrodantin - macrodantin d/c 07/04/2022  - Echo 01/12/4173  1 diastolic dysfunction  Mild AS  - 08/22/2022 try off acei    Symptoms are markedly disproportionate to objective findings and not clear to what extent this is actually a pulmonary  problem but pt does appear to have difficult to sort out respiratory symptoms of unknown origin for which  DDX  = almost all start with A and  include Adherence, Ace Inhibitors, Acid Reflux, Active Sinus Disease, Alpha 1 Antitripsin deficiency, Anxiety masquerading as Airways dz,  ABPA,  Allergy(esp in young), Aspiration (esp in elderly), Adverse effects of meds,  Active smoking or Vaping, A bunch of PE's/clot burden (a few small clots can't cause this syndrome unless there is already severe underlying pulm or vascular dz with poor reserve),  Anemia or thyroid disorder, plus two Bs  = Bronchiectasis and Beta blocker use..and one C= CHF    D dimer nl, hct and tsh ok as is BNP    No evidence chf despite AS/diastolic dysfunction so try off acei   and f/u q 6 weeks until sort all this out

## 2022-08-22 NOTE — Assessment & Plan Note (Signed)
Desat  on ambulation 07/04/2022  > d/c'd macrodantin  - 07/04/2022   Walked on RA  x  2  lap(s) =  approx 500  ft  @ slow pace, stopped due to desats/sob with lowest 02 sats 80% > rec use portable 02 to maintain > 90% (already had it at time of ov but didn't bring it with her)    - 08/22/2022   Walked on RA  x  2  lap(s) =  approx 300  ft  @ slow/cane pace, stopped due to knee pain> sob  with lowest 02 sats 88%    sats with exertion better today but disance less - not at last walk she experienced desats as soon as walked out of the exam which was not the case today.  Rec Maintain off macrodantin  Check d dimer to be complete  F/u in 6 weeks

## 2022-08-22 NOTE — Progress Notes (Unsigned)
Danielle Parrish, female    DOB: [birth year]   MRN: [id number]   Brief patient profile:  3   yowf never smoker  referred to pulmonary clinic 07/04/2022 by Danielle Parrish  for cough p shoulder injury requiring GA and surgery in Aug of 2022.  [employer] shopping still able to do groceries leaning on cart / mb and back flat x 75 ft with walker   History of Present Illness  07/04/2022  Pulmonary/ 1st office eval/Danielle Parrish on Macrodantin Chief Complaint  Patient presents with   Consult    Pt states she has some SOB and a runny nose x1 year. Pt states she has Oxygen and CPAP machine but she does not use the CPAP machine.   Dyspnea:  mb and back slowly = MMRC3 = can't walk 100 yards even at a slow pace at a flat grade s stopping due to sob   Cough: voice fatigue  Sleep: cpap/02  SABA use: minimal better    Rec You will need to get the echocardiogram asap - call this office if can't schedule it within  a week  Stop macrodantin  Please schedule a follow up office visit in 3- 4 weeks, sooner if needed  - [facility]  - bring all medications Late add: pt instructed on use of amb 02 = goal is to keep > 90% saturations at all times     08/22/2022  f/u ov/Danielle Parrish office/Danielle Parrish re: doe maint on ?  (Did not bring meds)   Chief Complaint  Patient presents with   Follow-up    Feels breathing is not doing well. Knees are bothering patient today.   Dyspnea:  walking mailbox and back but not checking sats despite "feeling breathing worse"  Cough: none but does have mild globus sensation on ACEi Sleeping: cpap/02 sleeping 0k SABA use: not really helping sob  02: not using x during the noct   No obvious day to day or daytime variability or assoc excess/ purulent sputum or mucus plugs or hemoptysis or cp or chest tightness, subjective wheeze or overt sinus or hb symptoms.   Sleeping ok  without nocturnal  or early am exacerbation  of respiratory  c/o's or need for noct saba. Also denies any obvious  fluctuation of symptoms with weather or environmental changes or other aggravating or alleviating factors except as outlined above   No unusual exposure hx or h/o childhood pna/ asthma or knowledge of premature birth.  Current Allergies, Complete Past Medical History, Past Surgical History, Family History, and Social History were reviewed in [employer] record.  ROS  The following are not active complaints unless bolded Hoarseness, sore throat, dysphagia, dental problems, itching, sneezing,  nasal congestion or discharge of excess mucus or purulent secretions, ear ache,   fever, chills, sweats, unintended wt loss or wt gain, classically pleuritic or exertional cp,  orthopnea pnd or arm/hand swelling  or leg swelling, presyncope, palpitations, abdominal pain, anorexia, nausea, vomiting, diarrhea  or change in bowel habits or change in bladder habits, change in stools or change in urine, dysuria, hematuria,  rash, arthralgias, visual complaints, headache, numbness, weakness or ataxia or problems with walking or coordination,  change in mood or  memory.        Current Meds  Medication Sig   acetaminophen (TYLENOL) 500 MG tablet Take 1 tablet (500 mg total) by mouth every 6 (six) hours as needed.   albuterol (VENTOLIN HFA) 108 (90 Base) MCG/ACT inhaler Take 2 puffs  eveery 6 hours as needed for wheezing or shortness of breath   amLODipine (NORVASC) 10 MG tablet Take 1 tablet (10 mg total) by mouth daily.   azelastine (ASTELIN) 0.1 % nasal spray Place 1 spray into both nostrils 2 (two) times daily. Use in each nostril as directed   calcium-vitamin D (OSCAL WITH D) 500-200 MG-UNIT TABS tablet TAKE 1 TABLET BY MOUTH EVERY DAY WITH BREAKFAST   Cholecalciferol (VITAMIN D-3 PO) Take 1 capsule by mouth daily.    cyclobenzaprine (FLEXERIL) 5 MG tablet Take 1 tablet (5 mg total) by mouth 3 (three) times daily as needed for muscle spasms.   diclofenac Sodium (VOLTAREN) 1 % GEL APPLY 4  GRAMS TOPICALLY 4 (FOUR) TIMES DAILY.   diphenhydrAMINE (BENADRYL) 25 MG tablet Take 50 mg by mouth at bedtime as needed for allergies.    erythromycin ophthalmic ointment Place 1 application. into both eyes 4 (four) times daily.   Ferrous Sulfate (IRON) 325 (65 Fe) MG TABS Take 1 tablet by mouth 3 (three) times daily.   gabapentin (NEURONTIN) 300 MG capsule TAKE 1 CAPSULE BY MOUTH 3 TIMES DAILY   ketoconazole (NIZORAL) 2 % cream APPLY TO AFFECTED AREA EVERYDAY   levothyroxine (SYNTHROID) 100 MCG tablet TAKE 1 TABLET BY MOUTH DAILY BEFORE BREAKFAST.   lidocaine (LIDODERM) 5 % PLACE 1 PATCH ONTO THE SKIN DAILY. REMOVE & DISCARD PATCH WITHIN 12 HOURS OR AS DIRECTED BY MD   lisinopril (ZESTRIL) 20 MG tablet Take 1 tablet (20 mg total) by mouth daily.   lovastatin (MEVACOR) 40 MG tablet TAKE 1 TABLET BY MOUTH EVERYDAY AT BEDTIME   metoprolol succinate (TOPROL-XL) 25 MG 24 hr tablet Take 1 tablet (25 mg total) by mouth daily.   mirabegron ER (MYRBETRIQ) 25 MG TB24 tablet Take 1 tablet (25 mg total) by mouth daily.   Multiple Vitamin (MULTI-VITAMIN) tablet Take by mouth.   Multiple Vitamins-Minerals (PRESERVISION AREDS 2 PO) Take by mouth.   nystatin (MYCOSTATIN/NYSTOP) powder APPLY TO AFFECTED AREA 4 TIMES A DAY   omeprazole (PRILOSEC) 20 MG capsule Take 1 capsule (20 mg total) by mouth 2 (two) times daily before a meal.   pyridOXINE (VITAMIN B-6) 100 MG tablet Take 100 mg by mouth daily.   sertraline (ZOLOFT) 100 MG tablet TAKE 2 TABLETS BY MOUTH EVERY DAY   sodium chloride (OCEAN) 0.65 % SOLN nasal spray Place 1 spray into both nostrils as needed for congestion.                 Past Medical History:  Diagnosis Date   Anxiety    Arthritis    CHF (congestive heart failure) (HCC)    Chronic pain of multiple joints    Depression    Essential hypertension    GERD (gastroesophageal reflux disease)    Hyperlipidemia    Hyperlipidemia associated with type 2 diabetes mellitus ([place])     Hypertension associated with type 2 diabetes mellitus ([place])    Hypothyroidism    Mild scoliosis 10/19/2021   Osteoporosis    Sleep apnea    CPAP   Type 2 diabetes mellitus with hypercholesterolemia (HCC)    Vitamin D deficiency        Objective:     Wt Readings from Last 3 Encounters:  08/22/22 173 lb 12.8 oz (78.8 kg)  08/02/22 172 lb (78 kg)  07/04/22 173 lb 3.2 oz (78.6 kg)      Vital signs reviewed  08/22/2022  - Note at rest 02 sats  95%  on RA   General appearance:    amb obese somber wf nad walks with cane/ raspy voice    HEENT : Oropharynx  clear      Nasal turbinates nl    NECK :  without  apparent JVD/ palpable Nodes/TM    LUNGS: no acc muscle use,  Nl contour chest which is clear to A and P bilaterally without cough on insp or exp maneuvers   CV:  RRR  no s3  2/6 SEM/ no increase in P2, and 1+ pitting both LEs  ABD:  soft and nontender with nl inspiratory excursion in the supine position. No bruits or organomegaly appreciated   MS:  Nl gait/ ext warm without deformities Or obvious joint restrictions  calf tenderness, cyanosis or clubbing    SKIN: warm and dry without lesions    NEURO:  alert, approp, nl sensorium with  no motor or cerebellar deficits apparent.     Labs ordered/ reviewed:      Chemistry      Component Value Date/Time   NA 138 08/22/2022 1104   K 4.5 08/22/2022 1104   CL 98 08/22/2022 1104   CO2 28 08/22/2022 1104   BUN 11 08/22/2022 1104   CREATININE 0.71 08/22/2022 1104   CREATININE 0.86 05/15/2013 0941      Component Value Date/Time   CALCIUM 11.2 (H) 08/22/2022 1104   ALKPHOS 76 06/19/2022 1446   AST 14 06/19/2022 1446   ALT 16 06/19/2022 1446   BILITOT 0.4 06/19/2022 1446        Lab Results  Component Value Date   WBC 5.1 07/04/2022   HGB 13.6 07/04/2022   HCT 40.7 07/04/2022   MCV 97.9 07/04/2022   PLT 191.0 07/04/2022     Lab Results  Component Value Date   DDIMER 0.23 08/22/2022      Lab Results   Component Value Date   TSH 1.350 06/19/2022     Lab Results  Component Value Date   PROBNP 50.0 07/04/2022       Lab Results  Component Value Date   ESRSEDRATE 7 07/04/2022   ESRSEDRATE 17 05/24/2017   ESRSEDRATE 18 10/22/2015          Assessment

## 2022-08-22 NOTE — Assessment & Plan Note (Addendum)
Onset Aug 2022  p GA for L shoulder surgery while on chronic macrodantin - macrodantin d/c 07/04/2022  - Echo 3/71/6967  1 diastolic dysfunction  Mild AS  - 08/22/2022 try off acei    In the best review of chronic cough to date ( NEJM 2016 375 [id number]) ,  ACEi are now felt to cause cough in up to  20% of pts which is a 4 fold increase from previous reports and does not include the variety of non-specific complaints we see in pulmonary clinic in pts on ACEi but previously attributed to another dx like  Copd/asthma and  include PNDS, throat congestion (globus) , "bronchitis", unexplained dyspnea and noct "strangling" sensations, and hoarseness, but also  atypical /refractory GERD symptoms like dysphagia and "bad heartburn"   The only way I know  to prove this is not an "ACEi Case" is a trial off ACEi x a minimum of 6 weeks then regroup.   >>> try benicar 20 mg daily   Each maintenance medication was reviewed in detail including emphasizing most importantly the difference between maintenance and prns and under what circumstances the prns are to be triggered using an action plan format where appropriate.  Total time for H and P, chart review, counseling,  directly observing portions of ambulatory 02 saturation study/ and generating customized AVS unique to this office visit / same day charting > 30 min for multiple  refractory respiratory  symptoms of uncertain etiology

## 2022-08-22 NOTE — Patient Instructions (Addendum)
Stop lisinopril  and start olmesartan 20 mg on daily in its place to see if helps breathing  Amlodipine may be making your swell up and need to reduce the dose if blood pressure too low   Make sure you check your oxygen saturation  AT  your highest level of activity (not after you stop)   to be sure it stays over 90% and adjust  02 flow upward to maintain this level if needed but remember to turn it back to previous settings when you stop (to conserve your supply).    Please remember to go to the lab department   for your tests - we will call you with the results when they are available.      Please schedule a follow up office visit in 4 weeks, sooner if needed

## 2022-08-23 ENCOUNTER — Ambulatory Visit: Payer: Medicare Other

## 2022-08-23 LAB — BASIC METABOLIC PANEL
BUN/Creatinine Ratio: 15 (ref 12–28)
BUN: 11 mg/dL (ref 8–27)
CO2: 28 mmol/L (ref 20–29)
Calcium: 11.2 mg/dL — ABNORMAL HIGH (ref 8.7–10.3)
Chloride: 98 mmol/L (ref 96–106)
Creatinine, Ser: 0.71 mg/dL (ref 0.57–1.00)
Glucose: 102 mg/dL — ABNORMAL HIGH (ref 70–99)
Potassium: 4.5 mmol/L (ref 3.5–5.2)
Sodium: 138 mmol/L (ref 134–144)
eGFR: 85 mL/min/{1.73_m2} (ref >59)

## 2022-08-23 LAB — D-DIMER, QUANTITATIVE: D-DIMER: 0.23 mg/L FEU (ref 0.00–0.49)

## 2022-08-26 ENCOUNTER — Other Ambulatory Visit: Payer: Self-pay | Admitting: Family Medicine

## 2022-08-26 DIAGNOSIS — E039 Hypothyroidism, unspecified: Secondary | ICD-10-CM

## 2022-08-30 ENCOUNTER — Ambulatory Visit: Payer: Medicare Other

## 2022-09-06 ENCOUNTER — Ambulatory Visit: Payer: Medicare Other

## 2022-09-11 DIAGNOSIS — M25561 Pain in right knee: Secondary | ICD-10-CM | POA: Diagnosis not present

## 2022-09-11 DIAGNOSIS — M25562 Pain in left knee: Secondary | ICD-10-CM | POA: Diagnosis not present

## 2022-09-13 ENCOUNTER — Other Ambulatory Visit: Payer: Self-pay

## 2022-09-13 ENCOUNTER — Ambulatory Visit: Payer: Medicare Other | Attending: Family Medicine | Admitting: Physical Therapy

## 2022-09-13 VITALS — BP 152/84

## 2022-09-13 DIAGNOSIS — M545 Low back pain, unspecified: Secondary | ICD-10-CM | POA: Insufficient documentation

## 2022-09-13 DIAGNOSIS — M5136 Other intervertebral disc degeneration, lumbar region: Secondary | ICD-10-CM | POA: Insufficient documentation

## 2022-09-13 DIAGNOSIS — M25562 Pain in left knee: Secondary | ICD-10-CM | POA: Insufficient documentation

## 2022-09-13 DIAGNOSIS — M25561 Pain in right knee: Secondary | ICD-10-CM | POA: Insufficient documentation

## 2022-09-13 DIAGNOSIS — G8929 Other chronic pain: Secondary | ICD-10-CM | POA: Diagnosis not present

## 2022-09-13 DIAGNOSIS — M1711 Unilateral primary osteoarthritis, right knee: Secondary | ICD-10-CM | POA: Diagnosis not present

## 2022-09-13 NOTE — Therapy (Signed)
OUTPATIENT PHYSICAL THERAPY LOWER EXTREMITY EVALUATION   Patient Name: Danielle Parrish MRN: [id number] DOB:[birth year], 81 y.o., female Today's Date: 09/13/2022   PT End of Session - 09/13/22 1407     Visit Number 1    Number of Visits 12    Date for PT Re-Evaluation 12/12/22    Authorization Type PROGRESS NOTE AT 10TH VISIT.  KX MODIFIER AFTER 15 VISITS.    PT Start Time [phone]    PT Stop Time 1024    PT Time Calculation (min) 35 min    Activity Tolerance Patient tolerated treatment well    Behavior During Therapy WFL for tasks assessed/performed             Past Medical History:  Diagnosis Date   Anxiety    Arthritis    CHF (congestive heart failure) (HCC)    Chronic pain of multiple joints    Depression    Essential hypertension    GERD (gastroesophageal reflux disease)    Hyperlipidemia    Hyperlipidemia associated with type 2 diabetes mellitus ([place])    Hypertension associated with type 2 diabetes mellitus ([place])    Hypothyroidism    Mild scoliosis 10/19/2021   Osteoporosis    Sleep apnea    CPAP   Type 2 diabetes mellitus with hypercholesterolemia ([place])    Vitamin D deficiency    Past Surgical History:  Procedure Laterality Date   ABDOMINAL HERNIA REPAIR     ABDOMINAL HYSTERECTOMY  1977   BREAST EXCISIONAL BIOPSY Right    EYE SURGERY  2020   bilateral cataract removal    KNEE ARTHROSCOPY Left 09/30/2014   Procedure: LEFT KNEE ARTHROSCOPY MEDIAL MENISECTOMY ABRASION CHONDROPLASTY SYNOVECTOMY SUPRAPATELLER CUFF;  Surgeon: Tobi Bastos, MD;  Location: [facility];  Service: Orthopedics;  Laterality: Left;   Patient Active Problem List   Diagnosis Date Noted   Chronic respiratory failure with hypoxia ([place]) 07/10/2022   DOE (dyspnea on exertion) 07/04/2022   Breast pain, left 02/01/2022   Generalized abdominal pain 02/01/2022   Chronic bilateral back pain 10/28/2021   Chronic midline low back pain without sciatica 01/30/2019   Age-related osteoporosis with  current pathol fracture of vertebra ([place]) 10/28/2018   Calcification of aorta (HCC) 10/02/2018   Compression fracture of T12 vertebra ([place]) 10/01/2018   Primary osteoarthritis of right knee 07/20/2017   Varicose veins of left lower extremity with complications 67/20/9470   OSA (obstructive sleep apnea) 09/14/2015   Chronic diastolic heart failure ([place]) 09/10/2015   Orthopnea 09/02/2015   Dysphagia, pharyngoesophageal phase 08/04/2015   Anorexia 08/04/2015   Fatigue 08/04/2015   Malaise and fatigue 08/04/2015   Depression, recurrent ([place]) 04/08/2015   Vitamin D deficiency    Anxiety    GERD (gastroesophageal reflux disease)    Arthritis    Hypertension associated with type 2 diabetes mellitus ([place]) 05/15/2013   Hyperlipidemia associated with type 2 diabetes mellitus ([place]) 05/15/2013   Type 2 diabetes mellitus with hypercholesterolemia ([place]) 05/15/2013   Morbid obesity ([place]) 05/15/2013   Hypothyroidism 05/15/2013     REFERRING PROVIDER: Vonna Kotyk Dettinger MD  REFERRING DIAG: OA right knee, DDD lumbar.  THERAPY DIAG:  Chronic bilateral low back pain without sciatica  Chronic pain of right knee  Chronic pain of left knee  Rationale for Evaluation and Treatment Rehabilitation  ONSET DATE: Ongoing.  SUBJECTIVE:   SUBJECTIVE STATEMENT: The patient presents to the clinic today with c/o bilateral knee pain and low back pain.  She states that she is actually  doing quite well as injections in her knees were very helpful.  Her pain-level is rated at a 4/10.  Her pain is throbbing in nature.  She is using a cane for safety and reports no falls.  She has had some SOB and is seeing a Pulmonologist and is on CPAP at night.  She would like to get in better condition and be able to increase her activity level.  PERTINENT HISTORY: OA, left knee surgery, HTN, DM, OP, DOE.  PAIN:  Are you having pain? Yes: NPRS scale: 4/10 Pain location: LB and knees. Pain description:  Throbbing. Aggravating factors: Increased activity. Relieving factors: "Not much."  PRECAUTIONS: Other: Monitor 02 and BP please.  WEIGHT BEARING RESTRICTIONS No  FALLS:  Has patient fallen in last 6 months? No  LIVING ENVIRONMENT: Lives with: lives with their family Lives in: House/apartment Has following equipment at home: Single point cane  OCCUPATION: Retired.  PLOF: Independent with household mobility with device  PATIENT GOALS:  Increased activity level.   OBJECTIVE:   POSTURE: Left LE ER with patella sitting laterallyrounded shoulders, forward head, decreased lumbar lordosis, decreased thoracic kyphosis, and flexed trunk   PALPATION: Increase tone over bilateral lumbar erector spinae musculature.  C/o right knee medial joint line tenderness.  LOWER EXTREMITY ROM:  Functional bilateral knee range of motion.  Left knee lack -10 degrees (assessed in supine).  LOWER EXTREMITY MMT:  Patient able to provide a solid 4+/5 bilateral LE strength grade at hips, knees and 5/5 for ankles.   FUNCTIONAL TESTS:  5 times sit to stand: 22 seconds (armrest usage). Timed up and go (TUG): 26 seconds (-) Romberg test.  GAIT: The patient walks with a straight cane for safety with left LE ER and she walks in a flexed trunk posture with bilateral knee flexion.   ASSESSMENT:  CLINICAL IMPRESSION: The patient presents to OPPt with c/o bilateral LB and knee knee. A recent injection in her knees has been quite helpful.  The patient has multiple postural abnormalities.  She is walking with a straight cane for safety and reports no falls.  Her 02 sat was 92%.  Her LE strength is quite good.  Her TUG test was 26 seconds and a 22 sec 5 time sit to stand test with armrests used.  Her bilateral lumbar erector spinae musculature exhibits increased tone.  She demonstrated a negative Romberg test. Patient will benefit from skilled physical therapy intervention to address pain and  deficits.   OBJECTIVE IMPAIRMENTS Abnormal gait, decreased endurance, decreased ROM, decreased strength, increased muscle spasms, postural dysfunction, and pain.   ACTIVITY LIMITATIONS carrying, lifting, bending, and locomotion level  PARTICIPATION LIMITATIONS: meal prep, cleaning, and laundry  PERSONAL FACTORS Time since onset of injury/illness/exacerbation are also affecting patient's functional outcome.   REHAB POTENTIAL: Good  CLINICAL DECISION MAKING: Evolving/moderate complexity  EVALUATION COMPLEXITY: Moderate   GOALS:   SHORT TERM GOALS: Target date: 09/27/2022  Ind with initial HEP. Baseline: Goal status: INITIAL   LONG TERM GOALS: Target date: 10/25/2022   Ind with advanced HEP. Baseline:  Goal status: INITIAL  2.  Improve TUG by 6 seconds Baseline:  Goal status: INITIAL  3.  Improve 5 time sit to stand by 6 seconds. Baseline:  Goal status: INITIAL  4.  Perform ADL's with LB and knee pain not to exceed 4/10. Baseline:  Goal status: INITIAL  PLAN: PT FREQUENCY: 2x/week  PT DURATION: 6 weeks  PLANNED INTERVENTIONS: Therapeutic exercises, Therapeutic activity, Neuromuscular re-education, Balance training, Gait  training, Patient/Family education, Self Care, Electrical stimulation, Cryotherapy, Moist heat, Ultrasound, and Manual therapy  PLAN FOR NEXT SESSION: Postural improvement exercises, core exercise progression, Nustep.  Please monitor 02 sat and BP.   Teran Daughenbaugh, [place], PT 09/13/2022, 2:12 PM

## 2022-09-17 DIAGNOSIS — R0902 Hypoxemia: Secondary | ICD-10-CM | POA: Diagnosis not present

## 2022-09-18 ENCOUNTER — Ambulatory Visit: Payer: Medicare Other | Admitting: Physical Therapy

## 2022-09-18 VITALS — BP 134/74

## 2022-09-18 DIAGNOSIS — M5136 Other intervertebral disc degeneration, lumbar region: Secondary | ICD-10-CM | POA: Diagnosis not present

## 2022-09-18 DIAGNOSIS — G8929 Other chronic pain: Secondary | ICD-10-CM

## 2022-09-18 DIAGNOSIS — M1711 Unilateral primary osteoarthritis, right knee: Secondary | ICD-10-CM | POA: Diagnosis not present

## 2022-09-18 DIAGNOSIS — M25562 Pain in left knee: Secondary | ICD-10-CM | POA: Diagnosis not present

## 2022-09-18 DIAGNOSIS — M25561 Pain in right knee: Secondary | ICD-10-CM | POA: Diagnosis not present

## 2022-09-18 DIAGNOSIS — M545 Low back pain, unspecified: Secondary | ICD-10-CM | POA: Diagnosis not present

## 2022-09-18 NOTE — Therapy (Signed)
OUTPATIENT PHYSICAL THERAPY LOWER EXTREMITY EVALUATION   Patient Name: Danielle Parrish MRN: [id number] DOB:May 13, 1941, 81 y.o., female Today's Date: 09/18/2022   PT End of Session - 09/18/22 1053     Visit Number 2    Number of Visits 12    Date for PT Re-Evaluation 12/12/22    Authorization Type PROGRESS NOTE AT 10TH VISIT.  KX MODIFIER AFTER 15 VISITS.    PT Start Time 1030    PT Stop Time 1109    PT Time Calculation (min) 39 min             Past Medical History:  Diagnosis Date   Anxiety    Arthritis    CHF (congestive heart failure) (HCC)    Chronic pain of multiple joints    Depression    Essential hypertension    GERD (gastroesophageal reflux disease)    Hyperlipidemia    Hyperlipidemia associated with type 2 diabetes mellitus ([place])    Hypertension associated with type 2 diabetes mellitus ([place])    Hypothyroidism    Mild scoliosis 10/19/2021   Osteoporosis    Sleep apnea    CPAP   Type 2 diabetes mellitus with hypercholesterolemia ([place])    Vitamin D deficiency    Past Surgical History:  Procedure Laterality Date   ABDOMINAL HERNIA REPAIR     ABDOMINAL HYSTERECTOMY  1977   BREAST EXCISIONAL BIOPSY Right    EYE SURGERY  2020   bilateral cataract removal    KNEE ARTHROSCOPY Left 09/30/2014   Procedure: LEFT KNEE ARTHROSCOPY MEDIAL MENISECTOMY ABRASION CHONDROPLASTY SYNOVECTOMY SUPRAPATELLER CUFF;  Surgeon: Tobi Bastos, MD;  Location: [facility];  Service: Orthopedics;  Laterality: Left;   Patient Active Problem List   Diagnosis Date Noted   Chronic respiratory failure with hypoxia ([place]) 07/10/2022   DOE (dyspnea on exertion) 07/04/2022   Breast pain, left 02/01/2022   Generalized abdominal pain 02/01/2022   Chronic bilateral back pain 10/28/2021   Chronic midline low back pain without sciatica 01/30/2019   Age-related osteoporosis with current pathol fracture of vertebra ([place]) 10/28/2018   Calcification of aorta (HCC) 10/02/2018   Compression  fracture of T12 vertebra ([place]) 10/01/2018   Primary osteoarthritis of right knee 07/20/2017   Varicose veins of left lower extremity with complications 86/57/8469   OSA (obstructive sleep apnea) 09/14/2015   Chronic diastolic heart failure ([place]) 09/10/2015   Orthopnea 09/02/2015   Dysphagia, pharyngoesophageal phase 08/04/2015   Anorexia 08/04/2015   Fatigue 08/04/2015   Malaise and fatigue 08/04/2015   Depression, recurrent ([place]) 04/08/2015   Vitamin D deficiency    Anxiety    GERD (gastroesophageal reflux disease)    Arthritis    Hypertension associated with type 2 diabetes mellitus ([place]) 05/15/2013   Hyperlipidemia associated with type 2 diabetes mellitus ([place]) 05/15/2013   Type 2 diabetes mellitus with hypercholesterolemia ([place]) 05/15/2013   Morbid obesity ([place]) 05/15/2013   Hypothyroidism 05/15/2013     REFERRING PROVIDER: Vonna Kotyk Dettinger MD  REFERRING DIAG: OA right knee, DDD lumbar.  THERAPY DIAG:  Chronic bilateral low back pain without sciatica  Chronic pain of right knee  Rationale for Evaluation and Treatment Rehabilitation  ONSET DATE: Ongoing.  SUBJECTIVE:   SUBJECTIVE STATEMENT:     No new complaints. ERTINENT HISTORY: OA, left knee surgery, HTN, DM, OP, DOE.  PAIN:  Are you having pain? Yes: NPRS scale: 4/10 Pain location: LB and knees. Pain description: Throbbing. Aggravating factors: Increased activity. Relieving factors: "Not much."  PRECAUTIONS: Other: Monitor  02 and BP please.  WEIGHT BEARING RESTRICTIONS No  FALLS:  Has patient fallen in last 6 months? No  LIVING ENVIRONMENT: Lives with: lives with their family Lives in: House/apartment Has following equipment at home: Single point cane  OCCUPATION: Retired.  PLOF: Independent with household mobility with device  PATIENT GOALS:  Increased activity level.   OBJECTIVE:                                      EXERCISE LOG 09/18/22:  Exercise Repetitions and Resistance Comments   Nustep level 3 15 minutes.   5# bilateral LAQ's 3 minutes   Red theraband resisted hip abduction 3 minutes   Hip bridges and mini-crunches 2 sets of 10    Green resisted walking (4 way)  1 minute each way        Blank cell = exercise not performed today   ASSESSMENT:  CLINICAL IMPRESSION: Patient did an excellent job with treatment today.  02 reached 94% with a BP of 134/74.     OBJECTIVE IMPAIRMENTS Abnormal gait, decreased endurance, decreased ROM, decreased strength, increased muscle spasms, postural dysfunction, and pain.   ACTIVITY LIMITATIONS carrying, lifting, bending, and locomotion level  PARTICIPATION LIMITATIONS: meal prep, cleaning, and laundry  PERSONAL FACTORS Time since onset of injury/illness/exacerbation are also affecting patient's functional outcome.   REHAB POTENTIAL: Good  CLINICAL DECISION MAKING: Evolving/moderate complexity  EVALUATION COMPLEXITY: Moderate   GOALS:   SHORT TERM GOALS: Target date: 10/02/2022  Ind with initial HEP. Baseline: Goal status: INITIAL   LONG TERM GOALS: Target date: 10/30/2022   Ind with advanced HEP. Baseline:  Goal status: INITIAL  2.  Improve TUG by 6 seconds Baseline:  Goal status: INITIAL  3.  Improve 5 time sit to stand by 6 seconds. Baseline:  Goal status: INITIAL  4.  Perform ADL's with LB and knee pain not to exceed 4/10. Baseline:  Goal status: INITIAL  PLAN: PT FREQUENCY: 2x/week  PT DURATION: 6 weeks  PLANNED INTERVENTIONS: Therapeutic exercises, Therapeutic activity, Neuromuscular re-education, Balance training, Gait training, Patient/Family education, Self Care, Electrical stimulation, Cryotherapy, Moist heat, Ultrasound, and Manual therapy  PLAN FOR NEXT SESSION: Postural improvement exercises, core exercise progression, Nustep.  Please monitor 02 sat and BP.   Sola Margolis, [place], PT 09/18/2022, 11:29 AM

## 2022-09-19 ENCOUNTER — Encounter: Payer: Self-pay | Admitting: Adult Health

## 2022-09-19 ENCOUNTER — Ambulatory Visit (INDEPENDENT_AMBULATORY_CARE_PROVIDER_SITE_OTHER): Payer: Medicare Other | Admitting: Adult Health

## 2022-09-19 VITALS — BP 146/70 | HR 49 | Ht 60.0 in | Wt 173.0 lb

## 2022-09-19 DIAGNOSIS — Z9989 Dependence on other enabling machines and devices: Secondary | ICD-10-CM | POA: Diagnosis not present

## 2022-09-19 DIAGNOSIS — G4733 Obstructive sleep apnea (adult) (pediatric): Secondary | ICD-10-CM | POA: Diagnosis not present

## 2022-09-19 NOTE — Progress Notes (Signed)
PATIENT: Danielle Parrish DOB: 1941-12-08  REASON FOR VISIT: follow up HISTORY FROM: patient  Chief Complaint  Patient presents with   Follow-up    Pt in 78 Pt here for CPAP f/u Pt states no questions or concerns for today's visit  Pt states she has SOB this am       HISTORY OF PRESENT ILLNESS: Today 09/19/22:  Danielle Parrish is an 81 year old female with a history of obstructive sleep apnea on CPAP.  She returns today for follow-up.  She states that the CPAP is working well for her.  She is still struggling with fatigue but has saw pulmonology and is currently in physical therapy.  She feels that so far has been helpful.  Her download is below.  She does have supplemental O2 bled into her CPAP     10/25/21: Danielle Parrish is an 81 year old female with a history of obstructive sleep apnea on CPAP.  She returns today for follow-up.  She reports that she is now on nocturnal oxygen.  She states that she did not feel like it was working right with her CPAP so she stopped using the CPAP machine in the last 2 weeks.  Her download is below      REVIEW OF SYSTEMS: Out of a complete 14 system review of symptoms, the patient complains only of the following symptoms, and all other reviewed systems are negative.  FSS 53 ESS 4  ALLERGIES: Allergies  Allergen Reactions   Crestor [Rosuvastatin] Other (See Comments)    Body aches.    Nsaids     Burns stomach.    Vytorin [Ezetimibe-Simvastatin]     Body aches.    Aspirin Nausea Only   Tolmetin Nausea Only    Burns stomach.     HOME MEDICATIONS: Outpatient Medications Prior to Visit  Medication Sig Dispense Refill   acetaminophen (TYLENOL) 500 MG tablet Take 1 tablet (500 mg total) by mouth every 6 (six) hours as needed. 30 tablet 0   albuterol (VENTOLIN HFA) 108 (90 Base) MCG/ACT inhaler Take 2 puffs eveery 6 hours as needed for wheezing or shortness of breath 6.7 each 3   amLODipine (NORVASC) 10 MG tablet Take 1 tablet (10 mg  total) by mouth daily. 90 tablet 3   azelastine (ASTELIN) 0.1 % nasal spray Place 1 spray into both nostrils 2 (two) times daily. Use in each nostril as directed 30 mL 12   Cholecalciferol (VITAMIN D-3 PO) Take 1 capsule by mouth daily.      diclofenac Sodium (VOLTAREN) 1 % GEL APPLY 4 GRAMS TOPICALLY 4 (FOUR) TIMES DAILY. 400 g 1   diphenhydrAMINE (BENADRYL) 25 MG tablet Take 50 mg by mouth at bedtime as needed for allergies.      erythromycin ophthalmic ointment Place 1 application. into both eyes 4 (four) times daily. 3.5 g 1   Ferrous Sulfate (IRON) 325 (65 Fe) MG TABS Take 1 tablet by mouth 3 (three) times daily.     gabapentin (NEURONTIN) 300 MG capsule TAKE 1 CAPSULE BY MOUTH 3 TIMES DAILY 90 capsule 1   ketoconazole (NIZORAL) 2 % cream APPLY TO AFFECTED AREA EVERYDAY 60 g 0   levothyroxine (SYNTHROID) 100 MCG tablet TAKE 1 TABLET BY MOUTH EVERY DAY BEFORE BREAKFAST 90 tablet 2   lidocaine (LIDODERM) 5 % PLACE 1 PATCH ONTO THE SKIN DAILY. REMOVE & DISCARD PATCH WITHIN 12 HOURS OR AS DIRECTED BY MD 30 patch 0   lovastatin (MEVACOR) 40 MG tablet TAKE 1 TABLET  BY MOUTH EVERYDAY AT BEDTIME 90 tablet 3   metoprolol succinate (TOPROL-XL) 25 MG 24 hr tablet Take 1 tablet (25 mg total) by mouth daily. 90 tablet 3   Multiple Vitamin (MULTI-VITAMIN) tablet Take by mouth.     Multiple Vitamins-Minerals (PRESERVISION AREDS 2 PO) Take by mouth.     nystatin (MYCOSTATIN/NYSTOP) powder APPLY TO AFFECTED AREA 4 TIMES A DAY 60 g 1   olmesartan (BENICAR) 20 MG tablet Take 1 tablet (20 mg total) by mouth daily. 30 tablet 2   omeprazole (PRILOSEC) 20 MG capsule Take 1 capsule (20 mg total) by mouth 2 (two) times daily before a meal. 180 capsule 3   pyridOXINE (VITAMIN B-6) 100 MG tablet Take 100 mg by mouth daily.     sertraline (ZOLOFT) 100 MG tablet TAKE 2 TABLETS BY MOUTH EVERY DAY 180 tablet 3   sodium chloride (OCEAN) 0.65 % SOLN nasal spray Place 1 spray into both nostrils as needed for congestion. 1  Bottle prn   calcium-vitamin D (OSCAL WITH D) 500-200 MG-UNIT TABS tablet TAKE 1 TABLET BY MOUTH EVERY DAY WITH BREAKFAST 90 tablet 1   cyclobenzaprine (FLEXERIL) 5 MG tablet Take 1 tablet (5 mg total) by mouth 3 (three) times daily as needed for muscle spasms. (Patient not taking: Reported on 09/19/2022) 30 tablet 1   mirabegron ER (MYRBETRIQ) 25 MG TB24 tablet Take 1 tablet (25 mg total) by mouth daily. (Patient not taking: Reported on 09/19/2022) 30 tablet 3   No facility-administered medications prior to visit.    PAST MEDICAL HISTORY: Past Medical History:  Diagnosis Date   Anxiety    Arthritis    CHF (congestive heart failure) (HCC)    Chronic pain of multiple joints    Depression    Essential hypertension    GERD (gastroesophageal reflux disease)    Hyperlipidemia    Hyperlipidemia associated with type 2 diabetes mellitus ([place])    Hypertension associated with type 2 diabetes mellitus ([place])    Hypothyroidism    Mild scoliosis 10/19/2021   Osteoporosis    Sleep apnea    CPAP   Type 2 diabetes mellitus with hypercholesterolemia ([place])    Vitamin D deficiency     PAST SURGICAL HISTORY: Past Surgical History:  Procedure Laterality Date   ABDOMINAL HERNIA REPAIR     ABDOMINAL HYSTERECTOMY  1977   BREAST EXCISIONAL BIOPSY Right    EYE SURGERY  2020   bilateral cataract removal    KNEE ARTHROSCOPY Left 09/30/2014   Procedure: LEFT KNEE ARTHROSCOPY MEDIAL MENISECTOMY ABRASION CHONDROPLASTY SYNOVECTOMY SUPRAPATELLER CUFF;  Surgeon: Tobi Bastos, MD;  Location: [facility];  Service: Orthopedics;  Laterality: Left;    FAMILY HISTORY: Family History  Problem Relation Age of Onset   Hypertension Mother    Stroke Mother    Cancer Father    Sleep apnea Grandson    Sleep apnea Daughter     SOCIAL HISTORY: Social History   Socioeconomic History   Marital status: Widowed    Spouse name: Laverna Peace   Number of children: 2   Years of education: Not on file   Highest education  level: High school graduate  Occupational History   Occupation: CNA    Comment: [employer]   Occupation: retired  Tobacco Use   Smoking status: Never    Passive exposure: Past   Smokeless tobacco: Never  Vaping Use   Vaping Use: Never used  Substance and Sexual Activity   Alcohol use: No   Drug  use: No   Sexual activity: Not Currently  Other Topics Concern   Not on file  Social History Narrative   Lives with her daughter   Not very active socially or physically   Social Determinants of Health   Financial Resource Strain: Low Risk  (08/02/2022)   Overall Financial Resource Strain (CARDIA)    Difficulty of Paying Living Expenses: Not very hard  Food Insecurity: No Food Insecurity (08/02/2022)   Hunger Vital Sign    Worried About Running Out of Food in the Last Year: Never true    Ran Out of Food in the Last Year: Never true  Transportation Needs: No Transportation Needs (08/02/2022)   PRAPARE - Hydrologist (Medical): No    Lack of Transportation (Non-Medical): No  Physical Activity: Inactive (08/02/2022)   Exercise Vital Sign    Days of Exercise per Week: 0 days    Minutes of Exercise per Session: 0 min  Stress: No Stress Concern Present (08/02/2022)   [place]    Feeling of Stress : Not at all  Social Connections: Socially Isolated (08/02/2022)   Social Connection and Isolation Panel [NHANES]    Frequency of Communication with Friends and Family: More than three times a week    Frequency of Social Gatherings with Friends and Family: More than three times a week    Attends Religious Services: Never    Marine scientist or Organizations: No    Attends Archivist Meetings: Never    Marital Status: Widowed  Intimate Partner Violence: Not At Risk (08/02/2022)   Humiliation, Afraid, Rape, and Kick questionnaire    Fear of Current or Ex-Partner: No    Emotionally Abused: No     Physically Abused: No    Sexually Abused: No      PHYSICAL EXAM  Vitals:   09/19/22 1112  BP: (!) 146/70  Pulse: (!) 49  SpO2: 97%  Weight: 173 lb (78.5 kg)  Height: 5' (1.524 m)   Body mass index is 33.79 kg/m.  Generalized: Well developed, in no acute distress  Chest: Lungs clear to auscultation bilaterally  Neurological examination  Mentation: Alert oriented to time, place, history taking. Follows all commands speech and language fluent Cranial nerve II-XII: Extraocular movements were full, visual field were full on confrontational test Head turning and shoulder shrug  were normal and symmetric.  Gait and station: Uses a cane when ambulating   DIAGNOSTIC DATA (LABS, IMAGING, TESTING) - I reviewed patient records, labs, notes, testing and imaging myself where available.  Lab Results  Component Value Date   WBC 5.1 07/04/2022   HGB 13.6 07/04/2022   HCT 40.7 07/04/2022   MCV 97.9 07/04/2022   PLT 191.0 07/04/2022      Component Value Date/Time   NA 138 08/22/2022 1104   K 4.5 08/22/2022 1104   CL 98 08/22/2022 1104   CO2 28 08/22/2022 1104   GLUCOSE 102 (H) 08/22/2022 1104   GLUCOSE 133 (H) 10/17/2019 2211   BUN 11 08/22/2022 1104   CREATININE 0.71 08/22/2022 1104   CREATININE 0.86 05/15/2013 0941   CALCIUM 11.2 (H) 08/22/2022 1104   PROT 6.5 06/19/2022 1446   ALBUMIN 4.4 06/19/2022 1446   AST 14 06/19/2022 1446   ALT 16 06/19/2022 1446   ALKPHOS 76 06/19/2022 1446   BILITOT 0.4 06/19/2022 1446   GFRNONAA 87 06/07/2020 1158   GFRNONAA 68 05/15/2013 0941  GFRAA 101 06/07/2020 1158   GFRAA 78 05/15/2013 0941   Lab Results  Component Value Date   CHOL 176 06/19/2022   HDL 51 06/19/2022   LDLCALC 95 06/19/2022   TRIG 172 (H) 06/19/2022   CHOLHDL 3.5 06/19/2022   Lab Results  Component Value Date   HGBA1C 5.1 06/19/2022   Lab Results  Component Value Date   [id number] 275 06/07/2021   Lab Results  Component Value Date   TSH 1.350  06/19/2022      ASSESSMENT AND PLAN 81 y.o. year old female  has a past medical history of Anxiety, Arthritis, CHF (congestive heart failure) ([place]), Chronic pain of multiple joints, Depression, Essential hypertension, GERD (gastroesophageal reflux disease), Hyperlipidemia, Hyperlipidemia associated with type 2 diabetes mellitus ([place]), Hypertension associated with type 2 diabetes mellitus ([place]), Hypothyroidism, Mild scoliosis (10/19/2021), Osteoporosis, Sleep apnea, Type 2 diabetes mellitus with hypercholesterolemia ([place]), and Vitamin D deficiency. here with:  OSA on CPAP  - CPAP compliance suboptimal - Good treatment of AHI  - Encourage patient to use CPAP nightly and > 4 hours each night - F/U in 1 year or sooner if needed   Ward Givens, MSN, NP-C 09/19/2022, 11:21 AM [facility] [address], [place], [place] [address] [phone]

## 2022-09-19 NOTE — Patient Instructions (Signed)
Continue using CPAP nightly and greater than 4 hours each night °If your symptoms worsen or you develop new symptoms please let us know.  ° °

## 2022-09-20 ENCOUNTER — Ambulatory Visit (INDEPENDENT_AMBULATORY_CARE_PROVIDER_SITE_OTHER): Payer: Medicare Other | Admitting: Family Medicine

## 2022-09-20 ENCOUNTER — Encounter: Payer: Self-pay | Admitting: Family Medicine

## 2022-09-20 VITALS — BP 140/70 | HR 59 | Temp 98.0°F | Ht 60.0 in | Wt 173.0 lb

## 2022-09-20 DIAGNOSIS — E785 Hyperlipidemia, unspecified: Secondary | ICD-10-CM

## 2022-09-20 DIAGNOSIS — E1169 Type 2 diabetes mellitus with other specified complication: Secondary | ICD-10-CM | POA: Diagnosis not present

## 2022-09-20 DIAGNOSIS — E1159 Type 2 diabetes mellitus with other circulatory complications: Secondary | ICD-10-CM

## 2022-09-20 DIAGNOSIS — Z23 Encounter for immunization: Secondary | ICD-10-CM

## 2022-09-20 DIAGNOSIS — D499 Neoplasm of unspecified behavior of unspecified site: Secondary | ICD-10-CM

## 2022-09-20 DIAGNOSIS — E7800 Pure hypercholesterolemia, unspecified: Secondary | ICD-10-CM

## 2022-09-20 DIAGNOSIS — I152 Hypertension secondary to endocrine disorders: Secondary | ICD-10-CM

## 2022-09-20 DIAGNOSIS — E039 Hypothyroidism, unspecified: Secondary | ICD-10-CM

## 2022-09-20 LAB — BAYER DCA HB A1C WAIVED: HB A1C (BAYER DCA - WAIVED): 5.6 % (ref 4.8–5.6)

## 2022-09-20 NOTE — Progress Notes (Signed)
BP (!) 140/70   Pulse (!) 59   Temp 98 F (36.7 C)   Ht 5' (1.524 m)   Wt 173 lb (78.5 kg)   SpO2 96%   BMI 33.79 kg/m    Subjective:   Patient ID: Danielle Parrish, female    DOB: 1941-02-11, 81 y.o.   MRN: [id number]  HPI: Danielle Parrish is a 81 y.o. female presenting on 09/20/2022 for referral to dermatology (Scaly pink places on left forearm. Insurance requires a referral) and vaginal nodule (Comes and goes. Has drained in the past)   HPI Type 2 diabetes mellitus Patient comes in today for recheck of his diabetes. Patient has been currently taking no medication currently. Patient is currently on an ACE inhibitor/ARB. Patient has not seen an ophthalmologist this year. Patient denies any issues with their feet. The symptom started onset as an adult hypertension and hyperlipidemia ARE RELATED TO DM   Hypertension Patient is currently on amlodipine and metoprolol and olmesartan, and their blood pressure today is 140/70. Patient denies any lightheadedness or dizziness. Patient denies headaches, blurred vision, chest pains, shortness of breath, or weakness. Denies any side effects from medication and is content with current medication.   Hyperlipidemia Patient is coming in for recheck of his hyperlipidemia. The patient is currently taking lovastatin. They deny any issues with myalgias or history of liver damage from it. They deny any focal numbness or weakness or chest pain.   Hypothyroidism recheck Patient is coming in for thyroid recheck today as well. They deny any issues with hair changes or heat or cold problems or diarrhea or constipation. They deny any chest pain or palpitations. They are currently on levothyroxine 100 micrograms   Patient has a few precancerous spots including blood in her left forearm and she feels like some rough spots on her face.  She was to get reestablished with a dermatologist  Patient has a vaginal lump that she says she has had for a month.  She  denies any drainage or redness or warmth.  She does not want it to be examined today and is not concerned about it because she has had them before and they have gone away.  Relevant past medical, surgical, family and social history reviewed and updated as indicated. Interim medical history since our last visit reviewed. Allergies and medications reviewed and updated.  Review of Systems  Constitutional:  Positive for fatigue. Negative for chills and fever.  HENT:  Negative for congestion, ear discharge and ear pain.   Eyes:  Negative for redness and visual disturbance.  Respiratory:  Negative for chest tightness and shortness of breath.   Cardiovascular:  Negative for chest pain and leg swelling.  Genitourinary:  Negative for difficulty urinating and dysuria.  Musculoskeletal:  Negative for back pain and gait problem.  Skin:  Negative for rash.  Neurological:  Negative for light-headedness and headaches.  Psychiatric/Behavioral:  Negative for agitation and behavioral problems.   All other systems reviewed and are negative.   Per HPI unless specifically indicated above   Allergies as of 09/20/2022       Reactions   Crestor [rosuvastatin] Other (See Comments)   Body aches.    Nsaids    Burns stomach.    Vytorin [ezetimibe-simvastatin]    Body aches.    Aspirin Nausea Only   Tolmetin Nausea Only   Burns stomach.         Medication List        Accurate as  of September 20, 2022  1:53 PM. If you have any questions, ask your nurse or doctor.          acetaminophen 500 MG tablet Commonly known as: TYLENOL Take 1 tablet (500 mg total) by mouth every 6 (six) hours as needed.   albuterol 108 (90 Base) MCG/ACT inhaler Commonly known as: VENTOLIN HFA Take 2 puffs eveery 6 hours as needed for wheezing or shortness of breath   amLODipine 10 MG tablet Commonly known as: NORVASC Take 1 tablet (10 mg total) by mouth daily.   azelastine 0.1 % nasal spray Commonly known as:  ASTELIN Place 1 spray into both nostrils 2 (two) times daily. Use in each nostril as directed   calcium-vitamin D 500-200 MG-UNIT Tabs tablet Commonly known as: OSCAL WITH D TAKE 1 TABLET BY MOUTH EVERY DAY WITH BREAKFAST   cyclobenzaprine 5 MG tablet Commonly known as: FLEXERIL Take 1 tablet (5 mg total) by mouth 3 (three) times daily as needed for muscle spasms.   diclofenac Sodium 1 % Gel Commonly known as: VOLTAREN APPLY 4 GRAMS TOPICALLY 4 (FOUR) TIMES DAILY.   diphenhydrAMINE 25 MG tablet Commonly known as: BENADRYL Take 50 mg by mouth at bedtime as needed for allergies.   erythromycin ophthalmic ointment Place 1 application. into both eyes 4 (four) times daily.   gabapentin 300 MG capsule Commonly known as: NEURONTIN TAKE 1 CAPSULE BY MOUTH 3 TIMES DAILY   Iron 325 (65 Fe) MG Tabs Take 1 tablet by mouth 3 (three) times daily.   ketoconazole 2 % cream Commonly known as: NIZORAL APPLY TO AFFECTED AREA EVERYDAY   levothyroxine 100 MCG tablet Commonly known as: SYNTHROID TAKE 1 TABLET BY MOUTH EVERY DAY BEFORE BREAKFAST   lidocaine 5 % Commonly known as: LIDODERM PLACE 1 PATCH ONTO THE SKIN DAILY. REMOVE & DISCARD PATCH WITHIN 12 HOURS OR AS DIRECTED BY MD   lovastatin 40 MG tablet Commonly known as: MEVACOR TAKE 1 TABLET BY MOUTH EVERYDAY AT BEDTIME   metoprolol succinate 25 MG 24 hr tablet Commonly known as: TOPROL-XL Take 1 tablet (25 mg total) by mouth daily.   mirabegron ER 25 MG Tb24 tablet Commonly known as: Myrbetriq Take 1 tablet (25 mg total) by mouth daily.   Multi-Vitamin tablet Take by mouth.   nystatin powder Commonly known as: MYCOSTATIN/NYSTOP APPLY TO AFFECTED AREA 4 TIMES A DAY   olmesartan 20 MG tablet Commonly known as: BENICAR Take 1 tablet (20 mg total) by mouth daily.   omeprazole 20 MG capsule Commonly known as: PRILOSEC Take 1 capsule (20 mg total) by mouth 2 (two) times daily before a meal.   PRESERVISION AREDS 2  PO Take by mouth.   pyridOXINE 100 MG tablet Commonly known as: VITAMIN B6 Take 100 mg by mouth daily.   sertraline 100 MG tablet Commonly known as: ZOLOFT TAKE 2 TABLETS BY MOUTH EVERY DAY   sodium chloride 0.65 % Soln nasal spray Commonly known as: OCEAN Place 1 spray into both nostrils as needed for congestion.   VITAMIN D-3 PO Take 1 capsule by mouth daily.         Objective:   BP (!) 140/70   Pulse (!) 59   Temp 98 F (36.7 C)   Ht 5' (1.524 m)   Wt 173 lb (78.5 kg)   SpO2 96%   BMI 33.79 kg/m   Wt Readings from Last 3 Encounters:  09/20/22 173 lb (78.5 kg)  09/19/22 173 lb (78.5 kg)  08/22/22  173 lb 12.8 oz (78.8 kg)    Physical Exam Vitals and nursing note reviewed.  Constitutional:      General: She is not in acute distress.    Appearance: She is well-developed. She is not diaphoretic.  Eyes:     Conjunctiva/sclera: Conjunctivae normal.     Pupils: Pupils are equal, round, and reactive to light.  Cardiovascular:     Rate and Rhythm: Normal rate and regular rhythm.     Heart sounds: Normal heart sounds. No murmur heard. Pulmonary:     Effort: Pulmonary effort is normal. No respiratory distress.     Breath sounds: Normal breath sounds. No wheezing.  Musculoskeletal:        General: No swelling. Normal range of motion.  Skin:    General: Skin is warm and dry.     Findings: Lesion (Precancerous squamous lesion on left forearm near elbow) present. No rash.  Neurological:     Mental Status: She is alert and oriented to person, place, and time.     Coordination: Coordination normal.  Psychiatric:        Behavior: Behavior normal.       Assessment & Plan:   Problem List Items Addressed This Visit       Cardiovascular and Mediastinum   Hypertension associated with type 2 diabetes mellitus ([place])   Relevant Orders   CBC with Differential/Platelet   CMP14+EGFR   Lipid panel   Bayer DCA Hb A1c Waived     Endocrine   Hyperlipidemia  associated with type 2 diabetes mellitus ([place])   Relevant Orders   CBC with Differential/Platelet   CMP14+EGFR   Lipid panel   Bayer DCA Hb A1c Waived   Type 2 diabetes mellitus with hypercholesterolemia (HCC)   Hypothyroidism - Primary   Relevant Orders   CBC with Differential/Platelet   CMP14+EGFR   Lipid panel   Bayer DCA Hb A1c Waived   TSH   Other Visit Diagnoses     Precancerous lesion       Relevant Orders   Ambulatory referral to Dermatology     A1c looks good at 5.6, will check thyroid levels and other blood work.  She declines vaginal exam today, likely cyst that she is having but cannot tell if we cannot examine.  Otherwise doing well, continue current medicine  Follow up plan: Return in about 3 months (around 12/20/2022), or if symptoms worsen or fail to improve, for Prediabetes and thyroid recheck.  Counseling provided for all of the vaccine components Orders Placed This Encounter  Procedures   CBC with Differential/Platelet   CMP14+EGFR   Lipid panel   Bayer DCA Hb A1c Waived   TSH   Ambulatory referral to Dermatology    Caryl Pina, MD [facility] 09/20/2022, 1:53 PM

## 2022-09-21 LAB — CBC WITH DIFFERENTIAL/PLATELET
Basophils Absolute: 0 10*3/uL (ref 0.0–0.2)
Basos: 0 %
EOS (ABSOLUTE): 0 10*3/uL (ref 0.0–0.4)
Eos: 0 %
Hematocrit: 42.2 % (ref 34.0–46.6)
Hemoglobin: 13.9 g/dL (ref 11.1–15.9)
Immature Grans (Abs): 0.1 10*3/uL (ref 0.0–0.1)
Immature Granulocytes: 1 %
Lymphocytes Absolute: 1.6 10*3/uL (ref 0.7–3.1)
Lymphs: 23 %
MCH: 31.8 pg (ref 26.6–33.0)
MCHC: 32.9 g/dL (ref 31.5–35.7)
MCV: 97 fL (ref 79–97)
Monocytes Absolute: 0.5 10*3/uL (ref 0.1–0.9)
Monocytes: 8 %
Neutrophils Absolute: 4.8 10*3/uL (ref 1.4–7.0)
Neutrophils: 68 %
Platelets: 210 10*3/uL (ref 150–450)
RBC: 4.37 x10E6/uL (ref 3.77–5.28)
RDW: 11.5 % — ABNORMAL LOW (ref 11.7–15.4)
WBC: 7.1 10*3/uL (ref 3.4–10.8)

## 2022-09-21 LAB — TSH: TSH: 1.39 u[IU]/mL (ref 0.450–4.500)

## 2022-09-21 LAB — CMP14+EGFR
ALT: 22 IU/L (ref 0–32)
AST: 17 IU/L (ref 0–40)
Albumin/Globulin Ratio: 2.2 (ref 1.2–2.2)
Albumin: 4.2 g/dL (ref 3.7–4.7)
Alkaline Phosphatase: 78 IU/L (ref 44–121)
BUN/Creatinine Ratio: 28 (ref 12–28)
BUN: 20 mg/dL (ref 8–27)
Bilirubin Total: 0.5 mg/dL (ref 0.0–1.2)
CO2: 25 mmol/L (ref 20–29)
Calcium: 10.4 mg/dL — ABNORMAL HIGH (ref 8.7–10.3)
Chloride: 99 mmol/L (ref 96–106)
Creatinine, Ser: 0.71 mg/dL (ref 0.57–1.00)
Globulin, Total: 1.9 g/dL (ref 1.5–4.5)
Glucose: 100 mg/dL — ABNORMAL HIGH (ref 70–99)
Potassium: 4.9 mmol/L (ref 3.5–5.2)
Sodium: 137 mmol/L (ref 134–144)
Total Protein: 6.1 g/dL (ref 6.0–8.5)
eGFR: 85 mL/min/{1.73_m2} (ref >59)

## 2022-09-21 LAB — LIPID PANEL
Chol/HDL Ratio: 2 ratio (ref 0.0–4.4)
Cholesterol, Total: 154 mg/dL (ref 100–199)
HDL: 76 mg/dL (ref >39)
LDL Chol Calc (NIH): 61 mg/dL (ref 0–99)
Triglycerides: 92 mg/dL (ref 0–149)
VLDL Cholesterol Cal: 17 mg/dL (ref 5–40)

## 2022-09-22 ENCOUNTER — Ambulatory Visit: Payer: Medicare Other | Admitting: Internal Medicine

## 2022-09-22 NOTE — Progress Notes (Deleted)
Danielle Parrish, female    DOB: 1941-02-03   MRN: [id number]   Brief patient profile:  23   yowf never smoker  referred to pulmonary clinic 07/04/2022 by Dr Rexene Alberts  for cough p shoulder injury requiring GA and surgery in Aug of 2022.  [employer] shopping still able to do groceries leaning on cart / mb and back flat x 75 ft with walker   History of Present Illness  07/04/2022  Pulmonary/ 1st office eval/Nickolaos Brallier on Macrodantin Chief Complaint  Patient presents with   Consult    Pt states she has some SOB and a runny nose x1 year. Pt states she has Oxygen and CPAP machine but she does not use the CPAP machine.   Dyspnea:  mb and back slowly = MMRC3 = can't walk 100 yards even at a slow pace at a flat grade s stopping due to sob   Cough: voice fatigue  Sleep: cpap/02  SABA use: minimal better    Rec You will need to get the echocardiogram asap - call this office if can't schedule it within  a week  Stop macrodantin  Please schedule a follow up office visit in 3- 4 weeks, sooner if needed  - [place] [facility]  - bring all medications Late add: pt instructed on use of amb 02 = goal is to keep > 90% saturations at all times     08/22/2022  f/u ov/[place] office/Shonette Rhames re: doe maint on ?  (Did not bring meds)   Chief Complaint  Patient presents with   Follow-up    Feels breathing is not doing well. Knees are bothering patient today.   Dyspnea:  walking mailbox and back but not checking sats despite "feeling breathing worse"  Cough: none but does have mild globus sensation on ACEi Sleeping: cpap/02 sleeping 0k SABA use: not really helping sob  02: not using x during the noct  Rec Stop lisinopril  and start olmesartan 20 mg on daily in its place to see if helps breathing Amlodipine may be making your swell up and need to reduce the dose if blood pressure too low  Make sure you check your oxygen saturation  AT  your highest level of activity (not after you stop)   to be sure it stays over  90%   09/22/2022  f/u ov/[place] office/Moorea Boissonneault re: *** maint on ***  No chief complaint on file.   Dyspnea:  *** Cough: *** Sleeping: *** SABA use: *** 02: *** Covid status: *** Lung cancer screening: ***   No obvious day to day or daytime variability or assoc excess/ purulent sputum or mucus plugs or hemoptysis or cp or chest tightness, subjective wheeze or overt sinus or hb symptoms.   *** without nocturnal  or early am exacerbation  of respiratory  c/o's or need for noct saba. Also denies any obvious fluctuation of symptoms with weather or environmental changes or other aggravating or alleviating factors except as outlined above   No unusual exposure hx or h/o childhood pna/ asthma or knowledge of premature birth.  Current Allergies, Complete Past Medical History, Past Surgical History, Family History, and Social History were reviewed in [employer] record.  ROS  The following are not active complaints unless bolded Hoarseness, sore throat, dysphagia, dental problems, itching, sneezing,  nasal congestion or discharge of excess mucus or purulent secretions, ear ache,   fever, chills, sweats, unintended wt loss or wt gain, classically pleuritic or exertional cp,  orthopnea pnd  or arm/hand swelling  or leg swelling, presyncope, palpitations, abdominal pain, anorexia, nausea, vomiting, diarrhea  or change in bowel habits or change in bladder habits, change in stools or change in urine, dysuria, hematuria,  rash, arthralgias, visual complaints, headache, numbness, weakness or ataxia or problems with walking or coordination,  change in mood or  memory.        No outpatient medications have been marked as taking for the 09/22/22 encounter (Appointment) with Tanda Rockers, MD.             Past Medical History:  Diagnosis Date   Anxiety    Arthritis    CHF (congestive heart failure) ([place])    Chronic pain of multiple joints    Depression    Essential  hypertension    GERD (gastroesophageal reflux disease)    Hyperlipidemia    Hyperlipidemia associated with type 2 diabetes mellitus ([place])    Hypertension associated with type 2 diabetes mellitus ([place])    Hypothyroidism    Mild scoliosis 10/19/2021   Osteoporosis    Sleep apnea    CPAP   Type 2 diabetes mellitus with hypercholesterolemia (HCC)    Vitamin D deficiency        Objective:     09/22/2022        ***   08/22/22 173 lb 12.8 oz (78.8 kg)  08/02/22 172 lb (78 kg)  07/04/22 173 lb 3.2 oz (78.6 kg)    Vital signs reviewed  09/22/2022  - Note at rest 02 sats  ***% on ***   General appearance:    ***       CV:  RRR  no s3  2/6 SEM/ no increase in P2, and 1+ pitting both LEs ***                  Assessment

## 2022-09-25 ENCOUNTER — Ambulatory Visit: Payer: Medicare Other | Attending: Family Medicine

## 2022-09-25 DIAGNOSIS — M25562 Pain in left knee: Secondary | ICD-10-CM | POA: Insufficient documentation

## 2022-09-25 DIAGNOSIS — G8929 Other chronic pain: Secondary | ICD-10-CM | POA: Diagnosis not present

## 2022-09-25 DIAGNOSIS — M25561 Pain in right knee: Secondary | ICD-10-CM | POA: Diagnosis not present

## 2022-09-25 DIAGNOSIS — M545 Low back pain, unspecified: Secondary | ICD-10-CM | POA: Insufficient documentation

## 2022-09-25 NOTE — Therapy (Signed)
OUTPATIENT PHYSICAL THERAPY LOWER EXTREMITY TREATMENT   Patient Name: Danielle Parrish MRN: [id number] DOB:12-Jun-1941, 81 y.o., female Today's Date: 09/25/2022   PT End of Session - 09/25/22 1036     Visit Number 3    Number of Visits 12    Date for PT Re-Evaluation 12/12/22    Authorization Type PROGRESS NOTE AT 10TH VISIT.  KX MODIFIER AFTER 15 VISITS.    PT Start Time 1030    PT Stop Time 1112    PT Time Calculation (min) 42 min             Past Medical History:  Diagnosis Date   Anxiety    Arthritis    CHF (congestive heart failure) (HCC)    Chronic pain of multiple joints    Depression    Essential hypertension    GERD (gastroesophageal reflux disease)    Hyperlipidemia    Hyperlipidemia associated with type 2 diabetes mellitus ([place])    Hypertension associated with type 2 diabetes mellitus ([place])    Hypothyroidism    Mild scoliosis 10/19/2021   Osteoporosis    Sleep apnea    CPAP   Type 2 diabetes mellitus with hypercholesterolemia ([place])    Vitamin D deficiency    Past Surgical History:  Procedure Laterality Date   ABDOMINAL HERNIA REPAIR     ABDOMINAL HYSTERECTOMY  1977   BREAST EXCISIONAL BIOPSY Right    EYE SURGERY  2020   bilateral cataract removal    KNEE ARTHROSCOPY Left 09/30/2014   Procedure: LEFT KNEE ARTHROSCOPY MEDIAL MENISECTOMY ABRASION CHONDROPLASTY SYNOVECTOMY SUPRAPATELLER CUFF;  Surgeon: Tobi Bastos, MD;  Location: [facility];  Service: Orthopedics;  Laterality: Left;   Patient Active Problem List   Diagnosis Date Noted   Chronic respiratory failure with hypoxia ([place]) 07/10/2022   DOE (dyspnea on exertion) 07/04/2022   Breast pain, left 02/01/2022   Generalized abdominal pain 02/01/2022   Chronic bilateral back pain 10/28/2021   Chronic midline low back pain without sciatica 01/30/2019   Age-related osteoporosis with current pathol fracture of vertebra ([place]) 10/28/2018   Calcification of aorta (HCC) 10/02/2018   Compression  fracture of T12 vertebra ([place]) 10/01/2018   Primary osteoarthritis of right knee 07/20/2017   Varicose veins of left lower extremity with complications 75/09/2584   OSA (obstructive sleep apnea) 09/14/2015   Chronic diastolic heart failure ([place]) 09/10/2015   Orthopnea 09/02/2015   Dysphagia, pharyngoesophageal phase 08/04/2015   Anorexia 08/04/2015   Fatigue 08/04/2015   Malaise and fatigue 08/04/2015   Depression, recurrent ([place]) 04/08/2015   Vitamin D deficiency    Anxiety    GERD (gastroesophageal reflux disease)    Arthritis    Hypertension associated with type 2 diabetes mellitus ([place]) 05/15/2013   Hyperlipidemia associated with type 2 diabetes mellitus ([place]) 05/15/2013   Type 2 diabetes mellitus with hypercholesterolemia ([place]) 05/15/2013   Morbid obesity ([place]) 05/15/2013   Hypothyroidism 05/15/2013     REFERRING PROVIDER: Vonna Kotyk Dettinger MD  REFERRING DIAG: OA right knee, DDD lumbar.  THERAPY DIAG:  Chronic bilateral low back pain without sciatica  Chronic pain of right knee  Rationale for Evaluation and Treatment Rehabilitation  ONSET DATE: Ongoing.  SUBJECTIVE:   SUBJECTIVE STATEMENT: Pt arrives for today's treatment session denying any pain.  PERTINENT HISTORY: OA, left knee surgery, HTN, DM, OP, DOE.  PAIN:  Are you having pain? No  PRECAUTIONS: Other: Monitor 02 and BP please.  WEIGHT BEARING RESTRICTIONS No  FALLS:  Has patient fallen in  last 6 months? No  LIVING ENVIRONMENT: Lives with: lives with their family Lives in: House/apartment Has following equipment at home: Single point cane  OCCUPATION: Retired.  PLOF: Independent with household mobility with device  PATIENT GOALS:  Increased activity level.   OBJECTIVE:                                      EXERCISE LOG 09/25/22:  Exercise Repetitions and Resistance Comments  Nustep level 3 15 minutes.   LAQ's 5# x 3 minutes   Hip Abduction Red t-band x 3 mins   Ball Squeezes X3 mins    Ham Curls Red t-band x 25 reps        Blank cell = exercise not performed today   ASSESSMENT:  CLINICAL IMPRESSION: Pt arrives for today's treatment session denying any pain.  Pt reports not sleeping well last night.  Pt requiring one seated rest break during warm up with SpO2 decreased to 86% on RA.  Pt quickly recovers (<3 seconds) to 91% with rest.  Pt's SpO2 monitored throughout treatment session and remains > 90% for remainder of session. Today's treatment session concentrating on seated exercises due to increased fatigue today.  Pt denied any pain at completion of today's treatment session.     OBJECTIVE IMPAIRMENTS Abnormal gait, decreased endurance, decreased ROM, decreased strength, increased muscle spasms, postural dysfunction, and pain.   ACTIVITY LIMITATIONS carrying, lifting, bending, and locomotion level  PARTICIPATION LIMITATIONS: meal prep, cleaning, and laundry  PERSONAL FACTORS Time since onset of injury/illness/exacerbation are also affecting patient's functional outcome.   REHAB POTENTIAL: Good  CLINICAL DECISION MAKING: Evolving/moderate complexity  EVALUATION COMPLEXITY: Moderate   GOALS:   SHORT TERM GOALS: Target date: 10/09/2022  Ind with initial HEP. Baseline: Goal status: INITIAL   LONG TERM GOALS: Target date: 11/06/2022   Ind with advanced HEP. Baseline:  Goal status: INITIAL  2.  Improve TUG by 6 seconds Baseline:  Goal status: INITIAL  3.  Improve 5 time sit to stand by 6 seconds. Baseline:  Goal status: INITIAL  4.  Perform ADL's with LB and knee pain not to exceed 4/10. Baseline:  Goal status: INITIAL  PLAN: PT FREQUENCY: 2x/week  PT DURATION: 6 weeks  PLANNED INTERVENTIONS: Therapeutic exercises, Therapeutic activity, Neuromuscular re-education, Balance training, Gait training, Patient/Family education, Self Care, Electrical stimulation, Cryotherapy, Moist heat, Ultrasound, and Manual therapy  PLAN FOR NEXT SESSION:  Postural improvement exercises, core exercise progression, Nustep.  Please monitor 02 sat and BP.   Kathrynn Ducking, PTA 09/25/2022, 11:15 AM

## 2022-10-03 ENCOUNTER — Other Ambulatory Visit: Payer: Self-pay | Admitting: Family Medicine

## 2022-10-03 DIAGNOSIS — M545 Low back pain, unspecified: Secondary | ICD-10-CM

## 2022-10-03 DIAGNOSIS — B372 Candidiasis of skin and nail: Secondary | ICD-10-CM

## 2022-10-05 ENCOUNTER — Encounter: Payer: Self-pay | Admitting: Family

## 2022-10-05 ENCOUNTER — Ambulatory Visit (INDEPENDENT_AMBULATORY_CARE_PROVIDER_SITE_OTHER): Payer: Medicare Other | Admitting: Family

## 2022-10-05 ENCOUNTER — Ambulatory Visit (INDEPENDENT_AMBULATORY_CARE_PROVIDER_SITE_OTHER): Payer: Medicare Other

## 2022-10-05 VITALS — BP 155/61 | HR 51 | Temp 97.0°F | Ht 60.0 in | Wt 175.8 lb

## 2022-10-05 DIAGNOSIS — R0981 Nasal congestion: Secondary | ICD-10-CM | POA: Diagnosis not present

## 2022-10-05 DIAGNOSIS — J9611 Chronic respiratory failure with hypoxia: Secondary | ICD-10-CM

## 2022-10-05 DIAGNOSIS — R0602 Shortness of breath: Secondary | ICD-10-CM

## 2022-10-05 DIAGNOSIS — E039 Hypothyroidism, unspecified: Secondary | ICD-10-CM | POA: Diagnosis not present

## 2022-10-05 DIAGNOSIS — I152 Hypertension secondary to endocrine disorders: Secondary | ICD-10-CM | POA: Diagnosis not present

## 2022-10-05 DIAGNOSIS — J90 Pleural effusion, not elsewhere classified: Secondary | ICD-10-CM | POA: Diagnosis not present

## 2022-10-05 DIAGNOSIS — E1159 Type 2 diabetes mellitus with other circulatory complications: Secondary | ICD-10-CM | POA: Diagnosis not present

## 2022-10-05 DIAGNOSIS — E7800 Pure hypercholesterolemia, unspecified: Secondary | ICD-10-CM | POA: Diagnosis not present

## 2022-10-05 DIAGNOSIS — E1169 Type 2 diabetes mellitus with other specified complication: Secondary | ICD-10-CM | POA: Diagnosis not present

## 2022-10-05 DIAGNOSIS — G4733 Obstructive sleep apnea (adult) (pediatric): Secondary | ICD-10-CM

## 2022-10-05 MED ORDER — CETIRIZINE HCL 10 MG PO TABS: 10.0000 mg | ORAL_TABLET | Freq: Every day | ORAL | 11 refills | Status: DC

## 2022-10-05 NOTE — Patient Instructions (Signed)
Shortness of Breath, Adult Shortness of breath is when a person has trouble breathing or when a person feels like she or he is having trouble breathing in enough air. Shortness of breath could be a sign of a medical problem. Follow these instructions at home:  Pollutants Do not use any products that contain nicotine or tobacco. These products include cigarettes, chewing tobacco, and vaping devices, such as e-cigarettes. This also includes cigars and pipes. If you need help quitting, ask your health care provider. Avoid things that can irritate your airways, including: Smoke. This includes campfire smoke, forest fire smoke, and secondhand smoke from tobacco products. Do not smoke or allow others to smoke in your home. Mold. Dust. Air pollution. Chemical fumes. Things that can give you an allergic reaction (allergens) if you have allergies. Common allergens include pollen from grasses or trees and animal dander. Keep your living space clean and free of mold and dust. General instructions Pay attention to any changes in your symptoms. Take over-the-counter and prescription medicines only as told by your health care provider. This includes oxygen therapy and inhaled medicines. Rest as needed. Return to your normal activities as told by your health care provider. Ask your health care provider what activities are safe for you. Keep all follow-up visits. This is important. Contact a health care provider if: Your condition does not improve as soon as expected. You have a hard time doing your normal activities, even after you rest. You have new symptoms. You cannot walk up stairs or exercise the way that you normally do. Get help right away if: Your shortness of breath gets worse. You have shortness of breath when you are resting. You feel light-headed or you faint. You have a cough that is not controlled with medicines. You cough up blood. You have pain with breathing. You have pain in your  chest, arms, shoulders, or abdomen. You have a fever. These symptoms may be an emergency. Get help right away. Call 911. Do not wait to see if the symptoms will go away. Do not drive yourself to the hospital. Summary Shortness of breath is when a person has trouble breathing enough air. It can be a sign of a medical problem. Avoid things that irritate your lungs, such as smoking, pollution, mold, and dust. Pay attention to changes in your symptoms and contact your health care provider if you have a hard time completing daily activities because of shortness of breath. This information is not intended to replace advice given to you by your health care provider. Make sure you discuss any questions you have with your health care provider. Document Revised: 07/30/2021 Document Reviewed: 07/30/2021 Elsevier Patient Education  2023 Elsevier Inc.  

## 2022-10-05 NOTE — Progress Notes (Signed)
Subjective:    Patient ID: Danielle Parrish, female    DOB: 1941/04/22, 81 y.o.   MRN: [id number]  Chief Complaint  Patient presents with   Nasal Congestion    3 yeas wants mold test     HPI PT presents to the office today with congestion and fatigue for the last three years. She read an article about mold and wants to be tested for this. She denies a cough, fever, chills, or rhinorrhea.   She saw her PCP on 09/20/22 and had a stable TSH, CBC, CMP.   She has CHF, HTN, DM,  OSA, and hypothyroidism. Uses 2L of O2 at night. She uses CPAP nightly.   She uses astelin spray.    Review of Systems  Constitutional:  Positive for fatigue.  Respiratory:  Positive for shortness of breath.   All other systems reviewed and are negative.      Objective:   Physical Exam Vitals reviewed.  Constitutional:      General: She is not in acute distress.    Appearance: She is well-developed. She is obese.  HENT:     Head: Normocephalic and atraumatic.  Eyes:     Pupils: Pupils are equal, round, and reactive to light.  Neck:     Thyroid: No thyromegaly.  Cardiovascular:     Rate and Rhythm: Normal rate and regular rhythm.     Heart sounds: Normal heart sounds. No murmur heard. Pulmonary:     Effort: Pulmonary effort is normal. No respiratory distress.     Breath sounds: Rhonchi present. No wheezing.  Abdominal:     General: Bowel sounds are normal. There is no distension.     Palpations: Abdomen is soft.     Tenderness: There is no abdominal tenderness.  Musculoskeletal:        General: No tenderness. Normal range of motion.     Cervical back: Normal range of motion and neck supple.  Skin:    General: Skin is warm and dry.  Neurological:     Mental Status: She is alert and oriented to person, place, and time.     Cranial Nerves: No cranial nerve deficit.     Deep Tendon Reflexes: Reflexes are normal and symmetric.  Psychiatric:        Behavior: Behavior normal.        Thought  Content: Thought content normal.        Judgment: Judgment normal.       BP (!) 155/61   Pulse (!) 51   Temp (!) 97 F (36.1 C) (Temporal)   Ht 5' (1.524 m)   Wt 175 lb 12.8 oz (79.7 kg)   SpO2 94%   BMI 34.33 kg/m      Assessment & Plan:   Danielle Parrish comes in today with chief complaint of Nasal Congestion (3 yeas wants mold test )   Diagnosis and orders addressed:  1. Acquired hypothyroidism  2. Chronic respiratory failure with hypoxia (HCC)  3. OSA (obstructive sleep apnea)  4. Type 2 diabetes mellitus with hypercholesterolemia ([place])  5. Hypertension associated with type 2 diabetes mellitus ([place])  6. SOB (shortness of breath)  - DG Chest 2 View - Allergen Profile, Mold - cetirizine (ZYRTEC) 10 MG tablet; Take 1 tablet (10 mg total) by mouth daily.  Dispense: 30 tablet; Refill: 11  7. Nasal congestion - DG Chest 2 View - Allergen Profile, Mold - cetirizine (ZYRTEC) 10 MG tablet; Take 1 tablet (10 mg total)  by mouth daily.  Dispense: 30 tablet; Refill: 11   Labs reviewed Health Maintenance reviewed Diet and exercise encouraged  Follow up plan: Keep chronic follow up with PCP, may need referral to Allergen specialists    Evelina Dun, FNP

## 2022-10-08 LAB — ALLERGEN PROFILE, MOLD
Alternaria Alternata IgE: <0.1 kU/L
Aspergillus Fumigatus IgE: <0.1 kU/L
Aureobasidi Pullulans IgE: <0.1 kU/L
Candida Albicans IgE: <0.1 kU/L
Cladosporium Herbarum IgE: <0.1 kU/L
M009-IgE Fusarium proliferatum: <0.1 kU/L
M014-IgE Epicoccum purpur: <0.1 kU/L
Mucor Racemosus IgE: <0.1 kU/L
Penicillium Chrysogen IgE: <0.1 kU/L
Phoma Betae IgE: <0.1 kU/L
Setomelanomma Rostrat: <0.1 kU/L
Stemphylium Herbarum IgE: <0.1 kU/L

## 2022-10-11 ENCOUNTER — Ambulatory Visit (INDEPENDENT_AMBULATORY_CARE_PROVIDER_SITE_OTHER): Payer: Medicare Other

## 2022-10-11 ENCOUNTER — Ambulatory Visit (INDEPENDENT_AMBULATORY_CARE_PROVIDER_SITE_OTHER): Payer: Medicare Other | Admitting: Family Medicine

## 2022-10-11 ENCOUNTER — Encounter: Payer: Self-pay | Admitting: Family Medicine

## 2022-10-11 VITALS — BP 139/67 | HR 51 | Temp 97.0°F | Ht 60.0 in | Wt 176.0 lb

## 2022-10-11 DIAGNOSIS — J90 Pleural effusion, not elsewhere classified: Secondary | ICD-10-CM | POA: Diagnosis not present

## 2022-10-11 NOTE — Progress Notes (Signed)
BP 139/67   Pulse (!) 51   Temp (!) 97 F (36.1 C)   Ht 5' (1.524 m)   Wt 176 lb (79.8 kg)   SpO2 92%   BMI 34.37 kg/m    Subjective:   Patient ID: Danielle Parrish, female    DOB: July 24, 1941, 81 y.o.   MRN: [id number]  HPI: Danielle Parrish is a 81 y.o. female presenting on 10/11/2022 for Shortness of Breath (Unable to take deep breath) and Fatigue   HPI Fatigue cough and shortness of breath Patient does have continued fatigue and cough and shortness of breath and she is coming in for follow-up.  She does have a pulmonologist.  She says she feels like it is more at her baseline but her energy is down.  She did have a chest x-ray just a week ago that showed pleural effusion and is coming in today for recheck for that.  She does feel like her short of breath is not more than normal.  She does not know since she has another lung doctor appointment.  She is not coughing anything up at this point denies any fevers or chills.  Relevant past medical, surgical, family and social history reviewed and updated as indicated. Interim medical history since our last visit reviewed. Allergies and medications reviewed and updated.  Review of Systems  Constitutional:  Negative for chills and fever.  HENT:  Positive for congestion. Negative for ear discharge and ear pain.   Eyes:  Negative for redness and visual disturbance.  Respiratory:  Positive for cough and shortness of breath. Negative for chest tightness and wheezing.   Cardiovascular:  Negative for chest pain and leg swelling.  Genitourinary:  Negative for difficulty urinating and dysuria.  Musculoskeletal:  Negative for back pain and gait problem.  Skin:  Negative for rash.  Neurological:  Negative for light-headedness and headaches.  Psychiatric/Behavioral:  Negative for agitation and behavioral problems.   All other systems reviewed and are negative.   Per HPI unless specifically indicated above   Allergies as of 10/11/2022        Reactions   Crestor [rosuvastatin] Other (See Comments)   Body aches.    Nsaids    Burns stomach.    Vytorin [ezetimibe-simvastatin]    Body aches.    Aspirin Nausea Only   Tolmetin Nausea Only   Burns stomach.         Medication List        Accurate as of October 11, 2022 11:24 AM. If you have any questions, ask your nurse or doctor.          acetaminophen 500 MG tablet Commonly known as: TYLENOL Take 1 tablet (500 mg total) by mouth every 6 (six) hours as needed.   albuterol 108 (90 Base) MCG/ACT inhaler Commonly known as: VENTOLIN HFA Take 2 puffs eveery 6 hours as needed for wheezing or shortness of breath   amLODipine 10 MG tablet Commonly known as: NORVASC Take 1 tablet (10 mg total) by mouth daily.   azelastine 0.1 % nasal spray Commonly known as: ASTELIN Place 1 spray into both nostrils 2 (two) times daily. Use in each nostril as directed   calcium-vitamin D 500-200 MG-UNIT Tabs tablet Commonly known as: OSCAL WITH D TAKE 1 TABLET BY MOUTH EVERY DAY WITH BREAKFAST   cetirizine 10 MG tablet Commonly known as: ZYRTEC Take 1 tablet (10 mg total) by mouth daily.   cyclobenzaprine 5 MG tablet Commonly known as: FLEXERIL Take  1 tablet (5 mg total) by mouth 3 (three) times daily as needed for muscle spasms.   diclofenac Sodium 1 % Gel Commonly known as: VOLTAREN APPLY 4 GRAMS TOPICALLY 4 (FOUR) TIMES DAILY.   diphenhydrAMINE 25 MG tablet Commonly known as: BENADRYL Take 50 mg by mouth at bedtime as needed for allergies.   erythromycin ophthalmic ointment Place 1 application. into both eyes 4 (four) times daily.   gabapentin 300 MG capsule Commonly known as: NEURONTIN TAKE 1 CAPSULE BY MOUTH 3 TIMES DAILY   Iron 325 (65 Fe) MG Tabs Take 1 tablet by mouth 3 (three) times daily.   ketoconazole 2 % cream Commonly known as: NIZORAL APPLY TO AFFECTED AREA EVERYDAY   levothyroxine 100 MCG tablet Commonly known as: SYNTHROID TAKE 1 TABLET BY  MOUTH EVERY DAY BEFORE BREAKFAST   lidocaine 5 % Commonly known as: LIDODERM PLACE 1 PATCH ONTO THE SKIN DAILY. REMOVE & DISCARD PATCH WITHIN 12 HOURS OR AS DIRECTED BY MD   lovastatin 40 MG tablet Commonly known as: MEVACOR TAKE 1 TABLET BY MOUTH EVERYDAY AT BEDTIME   metoprolol succinate 25 MG 24 hr tablet Commonly known as: TOPROL-XL Take 1 tablet (25 mg total) by mouth daily.   mirabegron ER 25 MG Tb24 tablet Commonly known as: Myrbetriq Take 1 tablet (25 mg total) by mouth daily.   Multi-Vitamin tablet Take by mouth.   nystatin powder Commonly known as: MYCOSTATIN/NYSTOP APPLY TO AFFECTED AREA 4 TIMES A DAY   olmesartan 20 MG tablet Commonly known as: BENICAR Take 1 tablet (20 mg total) by mouth daily.   omeprazole 20 MG capsule Commonly known as: PRILOSEC Take 1 capsule (20 mg total) by mouth 2 (two) times daily before a meal.   PRESERVISION AREDS 2 PO Take by mouth.   pyridOXINE 100 MG tablet Commonly known as: VITAMIN B6 Take 100 mg by mouth daily.   sertraline 100 MG tablet Commonly known as: ZOLOFT TAKE 2 TABLETS BY MOUTH EVERY DAY   sodium chloride 0.65 % Soln nasal spray Commonly known as: OCEAN Place 1 spray into both nostrils as needed for congestion.   VITAMIN D-3 PO Take 1 capsule by mouth daily.         Objective:   BP 139/67   Pulse (!) 51   Temp (!) 97 F (36.1 C)   Ht 5' (1.524 m)   Wt 176 lb (79.8 kg)   SpO2 92%   BMI 34.37 kg/m   Wt Readings from Last 3 Encounters:  10/11/22 176 lb (79.8 kg)  10/05/22 175 lb 12.8 oz (79.7 kg)  09/20/22 173 lb (78.5 kg)    Physical Exam Vitals and nursing note reviewed.  Constitutional:      General: She is not in acute distress.    Appearance: She is well-developed. She is not diaphoretic.  Eyes:     Conjunctiva/sclera: Conjunctivae normal.  Cardiovascular:     Rate and Rhythm: Normal rate and regular rhythm.     Heart sounds: Normal heart sounds. No murmur heard. Pulmonary:      Effort: Pulmonary effort is normal. No respiratory distress.     Breath sounds: Normal breath sounds. No wheezing, rhonchi or rales.  Musculoskeletal:        General: No tenderness. Normal range of motion.  Skin:    General: Skin is warm and dry.     Findings: No rash.  Neurological:     Mental Status: She is alert and oriented to person, place, and time.  Coordination: Coordination normal.  Psychiatric:        Behavior: Behavior normal.       Assessment & Plan:   Problem List Items Addressed This Visit   None Visit Diagnoses     Pleural effusion    -  Primary   Relevant Orders   DG Chest 2 View     We will repeat chest x-ray and have the radiologist look at it and contact the patient back once it is been read.  Follow up plan: Return if symptoms worsen or fail to improve.  Counseling provided for all of the vaccine components Orders Placed This Encounter  Procedures   DG Chest [place], MD [facility] 10/11/2022, 11:24 AM

## 2022-10-17 ENCOUNTER — Encounter: Payer: Self-pay | Admitting: Physical Therapy

## 2022-10-17 ENCOUNTER — Ambulatory Visit: Payer: Medicare Other | Admitting: Physical Therapy

## 2022-10-17 VITALS — BP 140/64 | HR 53

## 2022-10-17 DIAGNOSIS — R0902 Hypoxemia: Secondary | ICD-10-CM | POA: Diagnosis not present

## 2022-10-17 DIAGNOSIS — M25561 Pain in right knee: Secondary | ICD-10-CM | POA: Diagnosis not present

## 2022-10-17 DIAGNOSIS — M545 Low back pain, unspecified: Secondary | ICD-10-CM

## 2022-10-17 DIAGNOSIS — G8929 Other chronic pain: Secondary | ICD-10-CM

## 2022-10-17 DIAGNOSIS — M25562 Pain in left knee: Secondary | ICD-10-CM | POA: Diagnosis not present

## 2022-10-17 NOTE — Therapy (Signed)
OUTPATIENT PHYSICAL THERAPY LOWER EXTREMITY TREATMENT   Patient Name: Laure Leone MRN: [id number] DOB:1941-06-12, 81 y.o., female Today's Date: 10/17/2022   PT End of Session - 10/17/22 1117     Visit Number 4    Number of Visits 12    Date for PT Re-Evaluation 12/12/22    Authorization Type PROGRESS NOTE AT 10TH VISIT.  KX MODIFIER AFTER 15 VISITS.    PT Start Time 1121    PT Stop Time 1158    PT Time Calculation (min) 37 min    Activity Tolerance Patient tolerated treatment well    Behavior During Therapy WFL for tasks assessed/performed             Past Medical History:  Diagnosis Date   Anxiety    Arthritis    CHF (congestive heart failure) (HCC)    Chronic pain of multiple joints    Depression    Essential hypertension    GERD (gastroesophageal reflux disease)    Hyperlipidemia    Hyperlipidemia associated with type 2 diabetes mellitus ([place])    Hypertension associated with type 2 diabetes mellitus ([place])    Hypothyroidism    Mild scoliosis 10/19/2021   Osteoporosis    Sleep apnea    CPAP   Type 2 diabetes mellitus with hypercholesterolemia ([place])    Vitamin D deficiency    Past Surgical History:  Procedure Laterality Date   ABDOMINAL HERNIA REPAIR     ABDOMINAL HYSTERECTOMY  1977   BREAST EXCISIONAL BIOPSY Right    EYE SURGERY  2020   bilateral cataract removal    KNEE ARTHROSCOPY Left 09/30/2014   Procedure: LEFT KNEE ARTHROSCOPY MEDIAL MENISECTOMY ABRASION CHONDROPLASTY SYNOVECTOMY SUPRAPATELLER CUFF;  Surgeon: Tobi Bastos, MD;  Location: [facility];  Service: Orthopedics;  Laterality: Left;   Patient Active Problem List   Diagnosis Date Noted   Chronic respiratory failure with hypoxia ([place]) 07/10/2022   DOE (dyspnea on exertion) 07/04/2022   Breast pain, left 02/01/2022   Generalized abdominal pain 02/01/2022   Chronic bilateral back pain 10/28/2021   Chronic midline low back pain without sciatica 01/30/2019   Age-related osteoporosis with  current pathol fracture of vertebra ([place]) 10/28/2018   Calcification of aorta (HCC) 10/02/2018   Compression fracture of T12 vertebra ([place]) 10/01/2018   Primary osteoarthritis of right knee 07/20/2017   Varicose veins of left lower extremity with complications 81/09/3158   OSA (obstructive sleep apnea) 09/14/2015   Chronic diastolic heart failure ([place]) 09/10/2015   Orthopnea 09/02/2015   Dysphagia, pharyngoesophageal phase 08/04/2015   Anorexia 08/04/2015   Fatigue 08/04/2015   Malaise and fatigue 08/04/2015   Depression, recurrent ([place]) 04/08/2015   Vitamin D deficiency    Anxiety    GERD (gastroesophageal reflux disease)    Arthritis    Hypertension associated with type 2 diabetes mellitus ([place]) 05/15/2013   Hyperlipidemia associated with type 2 diabetes mellitus ([place]) 05/15/2013   Type 2 diabetes mellitus with hypercholesterolemia ([place]) 05/15/2013   Morbid obesity ([place]) 05/15/2013   Hypothyroidism 05/15/2013   REFERRING PROVIDER: Vonna Kotyk Dettinger MD  REFERRING DIAG: OA right knee, DDD lumbar.  THERAPY DIAG:  Chronic bilateral low back pain without sciatica  Chronic pain of right knee  Chronic pain of left knee  Rationale for Evaluation and Treatment Rehabilitation  ONSET DATE: Ongoing.  SUBJECTIVE:   SUBJECTIVE STATEMENT: Reports she has had quite a bit of LBP yesterday consistently which is unusual. B knees are okay today pain wise.  PERTINENT HISTORY: OA,  left knee surgery, HTN, DM, OP, DOE.  PAIN:  Are you having pain? Yes: NPRS scale: 6/10 Pain location: B lumbar Pain description: Discomfort Aggravating factors: Unknown Relieving factors: Unknown    PRECAUTIONS: Other: Monitor 02 and BP please.  WEIGHT BEARING RESTRICTIONS No  FALLS:  Has patient fallen in last 6 months? No  LIVING ENVIRONMENT: Lives with: lives with their family Lives in: House/apartment Has following equipment at home: Single point cane  OCCUPATION: Retired.  PLOF:  Independent with household mobility with device  PATIENT GOALS:  Increased activity level.  OBJECTIVE:                                     EXERCISE LOG 10/17/22:  Exercise Repetitions and Resistance Comments  Nustep  L3 x15 minutes   LAQ's 5# 3x10 reps   Clam Red x30 reps   Seated marching Red x20 reps   Ham Curls Red t-band x 20 reps   Ball press sitting for core X20 reps 3 sec holds   Shoulder row Red x20 reps   B D2 in sitting  Red x15 reps each    Blank cell = exercise not performed today   ASSESSMENT:  CLINICAL IMPRESSION: Patient presented in clinic with reports of more LBP today for unknown reason. Patient guided through seated therex for both LE strengthening as well as seated core strengthening. Posture emphasis VC'd throughout session. Vitals monitored prior to and following the end of the PT session and were WNL. Vitals for after PT session: 131/67, 93%, 54 bpm. Patient states that she sleeps a lot at home.   OBJECTIVE IMPAIRMENTS Abnormal gait, decreased endurance, decreased ROM, decreased strength, increased muscle spasms, postural dysfunction, and pain.   ACTIVITY LIMITATIONS carrying, lifting, bending, and locomotion level  PARTICIPATION LIMITATIONS: meal prep, cleaning, and laundry  PERSONAL FACTORS Time since onset of injury/illness/exacerbation are also affecting patient's functional outcome.   REHAB POTENTIAL: Good  CLINICAL DECISION MAKING: Evolving/moderate complexity  EVALUATION COMPLEXITY: Moderate  GOALS:  SHORT TERM GOALS: Target date: 10/31/2022  Ind with initial HEP. Baseline: Goal status: INITIAL  LONG TERM GOALS: Target date: 11/06/2022   Ind with advanced HEP. Baseline:  Goal status: INITIAL  2.  Improve TUG by 6 seconds Baseline:  Goal status: INITIAL  3.  Improve 5 time sit to stand by 6 seconds. Baseline:  Goal status: INITIAL  4.  Perform ADL's with LB and knee pain not to exceed 4/10. Baseline:  Goal status:  INITIAL  PLAN: PT FREQUENCY: 2x/week  PT DURATION: 6 weeks  PLANNED INTERVENTIONS: Therapeutic exercises, Therapeutic activity, Neuromuscular re-education, Balance training, Gait training, Patient/Family education, Self Care, Electrical stimulation, Cryotherapy, Moist heat, Ultrasound, and Manual therapy  PLAN FOR NEXT SESSION: Postural improvement exercises, core exercise progression, Nustep.  Please monitor 02 sat and BP.   Standley Brooking, PTA 10/17/2022, 12:06 PM

## 2022-10-24 ENCOUNTER — Ambulatory Visit: Payer: Medicare Other | Admitting: Physical Therapy

## 2022-10-24 ENCOUNTER — Encounter: Payer: Self-pay | Admitting: Physical Therapy

## 2022-10-24 VITALS — BP 138/76 | HR 68

## 2022-10-24 DIAGNOSIS — M545 Low back pain, unspecified: Secondary | ICD-10-CM | POA: Diagnosis not present

## 2022-10-24 DIAGNOSIS — M25561 Pain in right knee: Secondary | ICD-10-CM | POA: Diagnosis not present

## 2022-10-24 DIAGNOSIS — M25562 Pain in left knee: Secondary | ICD-10-CM | POA: Diagnosis not present

## 2022-10-24 DIAGNOSIS — G8929 Other chronic pain: Secondary | ICD-10-CM | POA: Diagnosis not present

## 2022-10-24 NOTE — Therapy (Signed)
OUTPATIENT PHYSICAL THERAPY LOWER EXTREMITY TREATMENT   Patient Name: Danielle Parrish MRN: [id number] DOB:03-05-1941, 81 y.o., female Today's Date: 10/24/2022   PT End of Session - 10/24/22 1527     Visit Number 5    Number of Visits 12    Date for PT Re-Evaluation 12/12/22    Authorization Type PROGRESS NOTE AT 10TH VISIT.  KX MODIFIER AFTER 15 VISITS.    PT Start Time 1523    PT Stop Time 1600    PT Time Calculation (min) 37 min    Activity Tolerance Patient tolerated treatment well    Behavior During Therapy WFL for tasks assessed/performed            Past Medical History:  Diagnosis Date   Anxiety    Arthritis    CHF (congestive heart failure) (HCC)    Chronic pain of multiple joints    Depression    Essential hypertension    GERD (gastroesophageal reflux disease)    Hyperlipidemia    Hyperlipidemia associated with type 2 diabetes mellitus ([place])    Hypertension associated with type 2 diabetes mellitus ([place])    Hypothyroidism    Mild scoliosis 10/19/2021   Osteoporosis    Sleep apnea    CPAP   Type 2 diabetes mellitus with hypercholesterolemia ([place])    Vitamin D deficiency    Past Surgical History:  Procedure Laterality Date   ABDOMINAL HERNIA REPAIR     ABDOMINAL HYSTERECTOMY  1977   BREAST EXCISIONAL BIOPSY Right    EYE SURGERY  2020   bilateral cataract removal    KNEE ARTHROSCOPY Left 09/30/2014   Procedure: LEFT KNEE ARTHROSCOPY MEDIAL MENISECTOMY ABRASION CHONDROPLASTY SYNOVECTOMY SUPRAPATELLER CUFF;  Surgeon: Tobi Bastos, MD;  Location: [facility];  Service: Orthopedics;  Laterality: Left;   Patient Active Problem List   Diagnosis Date Noted   Chronic respiratory failure with hypoxia ([place]) 07/10/2022   DOE (dyspnea on exertion) 07/04/2022   Breast pain, left 02/01/2022   Generalized abdominal pain 02/01/2022   Chronic bilateral back pain 10/28/2021   Chronic midline low back pain without sciatica 01/30/2019   Age-related osteoporosis with  current pathol fracture of vertebra ([place]) 10/28/2018   Calcification of aorta (HCC) 10/02/2018   Compression fracture of T12 vertebra ([place]) 10/01/2018   Primary osteoarthritis of right knee 07/20/2017   Varicose veins of left lower extremity with complications 54/65/6812   OSA (obstructive sleep apnea) 09/14/2015   Chronic diastolic heart failure ([place]) 09/10/2015   Orthopnea 09/02/2015   Dysphagia, pharyngoesophageal phase 08/04/2015   Anorexia 08/04/2015   Fatigue 08/04/2015   Malaise and fatigue 08/04/2015   Depression, recurrent ([place]) 04/08/2015   Vitamin D deficiency    Anxiety    GERD (gastroesophageal reflux disease)    Arthritis    Hypertension associated with type 2 diabetes mellitus ([place]) 05/15/2013   Hyperlipidemia associated with type 2 diabetes mellitus ([place]) 05/15/2013   Type 2 diabetes mellitus with hypercholesterolemia ([place]) 05/15/2013   Morbid obesity ([place]) 05/15/2013   Hypothyroidism 05/15/2013   REFERRING PROVIDER: Vonna Kotyk Dettinger MD  REFERRING DIAG: OA right knee, DDD lumbar.  THERAPY DIAG:  Chronic bilateral low back pain without sciatica  Chronic pain of right knee  Chronic pain of left knee  Rationale for Evaluation and Treatment Rehabilitation  ONSET DATE: Ongoing.  SUBJECTIVE:   SUBJECTIVE STATEMENT: Reports she is SOB upon arrival after rushing to get to PT.  PERTINENT HISTORY: OA, left knee surgery, HTN, DM, OP, DOE.  PAIN:  Are you having pain? Yes: NPRS scale: 7/10 Pain location: B lumbar Pain description: Discomfort Aggravating factors: Unknown Relieving factors: Unknown    PRECAUTIONS: Other: Monitor 02 and BP please.  PATIENT GOALS:  Increased activity level.  OBJECTIVE:                                     EXERCISE LOG 10/24/22:  Exercise Repetitions and Resistance Comments  Nustep  L3 x15 minutes   LAQ's 5# 3x10 reps   Clam Yellow x30 reps   Seated marching 3# x20 reps   HS curls X20 reps yellow   Ham Curls Red t-band  x 20 reps   Ball press sitting for core X20 reps 3 sec holds    Blank cell = exercise not performed today   ASSESSMENT:  CLINICAL IMPRESSION: Patient presented in clinic with reports of SOB upon arrival after rushing to get to her appointment. Patient reported less knee pain currently. Patient continues to experience greater LBP but rests mainly while she is home. Patient states that her BP has been within range for the past two months since med change. No complaints following treatment.  OBJECTIVE IMPAIRMENTS Abnormal gait, decreased endurance, decreased ROM, decreased strength, increased muscle spasms, postural dysfunction, and pain.   ACTIVITY LIMITATIONS carrying, lifting, bending, and locomotion level  PARTICIPATION LIMITATIONS: meal prep, cleaning, and laundry  PERSONAL FACTORS Time since onset of injury/illness/exacerbation are also affecting patient's functional outcome.   REHAB POTENTIAL: Good  CLINICAL DECISION MAKING: Evolving/moderate complexity  EVALUATION COMPLEXITY: Moderate  GOALS:  SHORT TERM GOALS: Target date: 11/07/2022  Ind with initial HEP. Baseline: Goal status: INITIAL  LONG TERM GOALS: Target date: 11/06/2022   Ind with advanced HEP. Baseline:  Goal status: INITIAL  2.  Improve TUG by 6 seconds Baseline:  Goal status: INITIAL  3.  Improve 5 time sit to stand by 6 seconds. Baseline:  Goal status: INITIAL  4.  Perform ADL's with LB and knee pain not to exceed 4/10. Baseline:  Goal status: INITIAL  PLAN: PT FREQUENCY: 2x/week  PT DURATION: 6 weeks  PLANNED INTERVENTIONS: Therapeutic exercises, Therapeutic activity, Neuromuscular re-education, Balance training, Gait training, Patient/Family education, Self Care, Electrical stimulation, Cryotherapy, Moist heat, Ultrasound, and Manual therapy  PLAN FOR NEXT SESSION: Postural improvement exercises, core exercise progression, Nustep.  Please monitor 02 sat and BP.  Standley Brooking,  PTA 10/24/2022, 4:19 PM

## 2022-10-25 ENCOUNTER — Telehealth: Payer: Self-pay | Admitting: *Deleted

## 2022-10-25 ENCOUNTER — Ambulatory Visit: Payer: Medicare Other | Admitting: Internal Medicine

## 2022-10-25 ENCOUNTER — Encounter: Payer: Medicare Other | Admitting: Physical Therapy

## 2022-10-25 ENCOUNTER — Ambulatory Visit: Payer: Medicare Other | Admitting: Adult Health

## 2022-10-25 NOTE — Telephone Encounter (Signed)
I called and gave the pt that her DL for her cpap machine looked good, good compliance and treatment of her apnea.  She thanked me for her call.

## 2022-10-25 NOTE — Telephone Encounter (Signed)
I called pt and could not LM.

## 2022-10-25 NOTE — Telephone Encounter (Signed)
Pt returned a call from nurse. Requesting a call-back.

## 2022-10-25 NOTE — Telephone Encounter (Signed)
Pt here brought machine for DL, on her cpap.  Had visit 08-2022, with no DL. Has appt 08-2023.  Downloaded the usage. Will contact if any changes.  Rodman Pickle to relay to pt.

## 2022-10-25 NOTE — Progress Notes (Deleted)
Danielle Parrish, female    DOB: 11-03-1941   MRN: [id number]   Brief patient profile:  62   yowf never smoker  referred to pulmonary clinic 07/04/2022 by Dr Rexene Alberts  for cough p shoulder injury requiring GA and surgery in Aug of 2022.  [employer] shopping still able to do groceries leaning on cart / mb and back flat x 75 ft with walker   History of Present Illness  07/04/2022  Pulmonary/ 1st office eval/Leeanne Butters on Macrodantin Chief Complaint  Patient presents with   Consult    Pt states she has some SOB and a runny nose x1 year. Pt states she has Oxygen and CPAP machine but she does not use the CPAP machine.   Dyspnea:  mb and back slowly = MMRC3 = can't walk 100 yards even at a slow pace at a flat grade s stopping due to sob   Cough: voice fatigue  Sleep: cpap/02  SABA use: minimal better    Rec You will need to get the echocardiogram asap - call this office if can't schedule it within  a week  Stop macrodantin  Please schedule a follow up office visit in 3- 4 weeks, sooner if needed  - [place] [facility]  - bring all medications Late add: pt instructed on use of amb 02 = goal is to keep > 90% saturations at all times     08/22/2022  f/u ov/[place] office/Aubryanna Nesheim re: doe maint on ?  (Did not bring meds)   Chief Complaint  Patient presents with   Follow-up    Feels breathing is not doing well. Knees are bothering patient today.   Dyspnea:  walking mailbox and back but not checking sats despite "feeling breathing worse"  Cough: none but does have mild globus sensation on ACEi Sleeping: cpap/02 sleeping 0k SABA use: not really helping sob  02: not using x during the noct  Rec Stop lisinopril  and start olmesartan 20 mg on daily in its place to see if helps breathing Amlodipine may be making your swell up and need to reduce the dose if blood pressure too low  Make sure you check your oxygen saturation  AT  your highest level of activity (not after you stop)   to be sure it stays over  90%      Please schedule a follow up office visit in 4 weeks, sooner if needed    10/25/2022  f/u ov/Wilgus Deyton re: ***   maint on ***  No chief complaint on file.   Dyspnea:  *** Cough: *** Sleeping: *** SABA use: *** 02: *** Covid status:   *** Lung cancer screening :  ***    No obvious day to day or daytime variability or assoc excess/ purulent sputum or mucus plugs or hemoptysis or cp or chest tightness, subjective wheeze or overt sinus or hb symptoms.   *** without nocturnal  or early am exacerbation  of respiratory  c/o's or need for noct saba. Also denies any obvious fluctuation of symptoms with weather or environmental changes or other aggravating or alleviating factors except as outlined above   No unusual exposure hx or h/o childhood pna/ asthma or knowledge of premature birth.  Current Allergies, Complete Past Medical History, Past Surgical History, Family History, and Social History were reviewed in [employer] record.  ROS  The following are not active complaints unless bolded Hoarseness, sore throat, dysphagia, dental problems, itching, sneezing,  nasal congestion or discharge of excess  mucus or purulent secretions, ear ache,   fever, chills, sweats, unintended wt loss or wt gain, classically pleuritic or exertional cp,  orthopnea pnd or arm/hand swelling  or leg swelling, presyncope, palpitations, abdominal pain, anorexia, nausea, vomiting, diarrhea  or change in bowel habits or change in bladder habits, change in stools or change in urine, dysuria, hematuria,  rash, arthralgias, visual complaints, headache, numbness, weakness or ataxia or problems with walking or coordination,  change in mood or  memory.        No outpatient medications have been marked as taking for the 10/25/22 encounter (Appointment) with Tanda Rockers, MD.         .            Past Medical History:  Diagnosis Date   Anxiety    Arthritis    CHF (congestive heart failure)  (HCC)    Chronic pain of multiple joints    Depression    Essential hypertension    GERD (gastroesophageal reflux disease)    Hyperlipidemia    Hyperlipidemia associated with type 2 diabetes mellitus ([place])    Hypertension associated with type 2 diabetes mellitus ([place])    Hypothyroidism    Mild scoliosis 10/19/2021   Osteoporosis    Sleep apnea    CPAP   Type 2 diabetes mellitus with hypercholesterolemia (HCC)    Vitamin D deficiency        Objective:     10/25/2022         ***   08/22/22 173 lb 12.8 oz (78.8 kg)  08/02/22 172 lb (78 kg)  07/04/22 173 lb 3.2 oz (78.6 kg)            2/6 SEM/ no increase in P2, and 1+ pitting both LEs***            Assessment

## 2022-10-25 NOTE — Telephone Encounter (Signed)
DL shows good compliance and treatment of apnea

## 2022-11-02 DIAGNOSIS — H02132 Senile ectropion of right lower eyelid: Secondary | ICD-10-CM | POA: Diagnosis not present

## 2022-11-02 DIAGNOSIS — H02131 Senile ectropion of right upper eyelid: Secondary | ICD-10-CM | POA: Diagnosis not present

## 2022-11-10 ENCOUNTER — Encounter: Payer: Self-pay | Admitting: Physical Therapy

## 2022-11-10 ENCOUNTER — Ambulatory Visit: Payer: Medicare Other | Attending: Family Medicine | Admitting: Physical Therapy

## 2022-11-10 VITALS — BP 123/72

## 2022-11-10 DIAGNOSIS — M25562 Pain in left knee: Secondary | ICD-10-CM | POA: Insufficient documentation

## 2022-11-10 DIAGNOSIS — G8929 Other chronic pain: Secondary | ICD-10-CM | POA: Insufficient documentation

## 2022-11-10 DIAGNOSIS — M545 Low back pain, unspecified: Secondary | ICD-10-CM | POA: Insufficient documentation

## 2022-11-10 DIAGNOSIS — M25561 Pain in right knee: Secondary | ICD-10-CM | POA: Insufficient documentation

## 2022-11-10 NOTE — Therapy (Signed)
OUTPATIENT PHYSICAL THERAPY LOWER EXTREMITY TREATMENT   Patient Name: Danielle Parrish MRN: [id number] DOB:17-Jul-1941, 81 y.o., female Today's Date: 11/10/2022   PT End of Session - 11/10/22 1237     Visit Number 6    Number of Visits 12    Date for PT Re-Evaluation 12/12/22    Authorization Type PROGRESS NOTE AT 10TH VISIT.  KX MODIFIER AFTER 15 VISITS.    PT Start Time 1030    PT Stop Time 1109    PT Time Calculation (min) 39 min    Activity Tolerance Patient tolerated treatment well    Behavior During Therapy WFL for tasks assessed/performed            Past Medical History:  Diagnosis Date   Anxiety    Arthritis    CHF (congestive heart failure) (HCC)    Chronic pain of multiple joints    Depression    Essential hypertension    GERD (gastroesophageal reflux disease)    Hyperlipidemia    Hyperlipidemia associated with type 2 diabetes mellitus ([place])    Hypertension associated with type 2 diabetes mellitus ([place])    Hypothyroidism    Mild scoliosis 10/19/2021   Osteoporosis    Sleep apnea    CPAP   Type 2 diabetes mellitus with hypercholesterolemia ([place])    Vitamin D deficiency    Past Surgical History:  Procedure Laterality Date   ABDOMINAL HERNIA REPAIR     ABDOMINAL HYSTERECTOMY  1977   BREAST EXCISIONAL BIOPSY Right    EYE SURGERY  2020   bilateral cataract removal    KNEE ARTHROSCOPY Left 09/30/2014   Procedure: LEFT KNEE ARTHROSCOPY MEDIAL MENISECTOMY ABRASION CHONDROPLASTY SYNOVECTOMY SUPRAPATELLER CUFF;  Surgeon: Tobi Bastos, MD;  Location: [facility];  Service: Orthopedics;  Laterality: Left;   Patient Active Problem List   Diagnosis Date Noted   Chronic respiratory failure with hypoxia ([place]) 07/10/2022   DOE (dyspnea on exertion) 07/04/2022   Breast pain, left 02/01/2022   Generalized abdominal pain 02/01/2022   Chronic bilateral back pain 10/28/2021   Chronic midline low back pain without sciatica 01/30/2019   Age-related osteoporosis with  current pathol fracture of vertebra ([place]) 10/28/2018   Calcification of aorta (HCC) 10/02/2018   Compression fracture of T12 vertebra ([place]) 10/01/2018   Primary osteoarthritis of right knee 07/20/2017   Varicose veins of left lower extremity with complications 00/93/8182   OSA (obstructive sleep apnea) 09/14/2015   Chronic diastolic heart failure ([place]) 09/10/2015   Orthopnea 09/02/2015   Dysphagia, pharyngoesophageal phase 08/04/2015   Anorexia 08/04/2015   Fatigue 08/04/2015   Malaise and fatigue 08/04/2015   Depression, recurrent ([place]) 04/08/2015   Vitamin D deficiency    Anxiety    GERD (gastroesophageal reflux disease)    Arthritis    Hypertension associated with type 2 diabetes mellitus ([place]) 05/15/2013   Hyperlipidemia associated with type 2 diabetes mellitus ([place]) 05/15/2013   Type 2 diabetes mellitus with hypercholesterolemia ([place]) 05/15/2013   Morbid obesity ([place]) 05/15/2013   Hypothyroidism 05/15/2013   REFERRING PROVIDER: Vonna Kotyk Dettinger MD  REFERRING DIAG: OA right knee, DDD lumbar.  THERAPY DIAG:  Chronic pain of right knee  Chronic pain of left knee  Rationale for Evaluation and Treatment Rehabilitation  ONSET DATE: Ongoing.  SUBJECTIVE:   SUBJECTIVE STATEMENT: Doing okay. PERTINENT HISTORY: OA, left knee surgery, HTN, DM, OP, DOE.  PAIN:  Are you having pain? Yes: NPRS scale: 7/10 Pain location: B lumbar Pain description: Discomfort Aggravating factors: Unknown Relieving  factors: Unknown    PRECAUTIONS: Other: Monitor 02 and BP please.  PATIENT GOALS:  Increased activity level.  OBJECTIVE:                                     EXERCISE LOG 11/10/22:  Exercise Repetitions and Resistance Comments  Nustep  L4 x15 minutes   Back ext 30# 3 minutes   Ham curls 30# x 3 minutes   Knee ext 10# x 3 minutes.               In parallel bars:  Rockerboard 3 minutes forward and back and 3 minutes side to side.  ASSESSMENT:  CLINICAL  IMPRESSION: Patient did very well today.  Her 02 sat stayed consistently around 93% today.  She did very well with weight machines and stated how much she enjoyed using them.  No pain increase with there ex.  Added back extension machine as well without complaint.  OBJECTIVE IMPAIRMENTS Abnormal gait, decreased endurance, decreased ROM, decreased strength, increased muscle spasms, postural dysfunction, and pain.   ACTIVITY LIMITATIONS carrying, lifting, bending, and locomotion level  PARTICIPATION LIMITATIONS: meal prep, cleaning, and laundry  PERSONAL FACTORS Time since onset of injury/illness/exacerbation are also affecting patient's functional outcome.   REHAB POTENTIAL: Good  CLINICAL DECISION MAKING: Evolving/moderate complexity  EVALUATION COMPLEXITY: Moderate  GOALS:  SHORT TERM GOALS: Target date: 11/24/2022  Ind with initial HEP. Baseline: Goal status: INITIAL  LONG TERM GOALS: Target date: 11/06/2022   Ind with advanced HEP. Baseline:  Goal status: INITIAL  2.  Improve TUG by 6 seconds Baseline:  Goal status: INITIAL  3.  Improve 5 time sit to stand by 6 seconds. Baseline:  Goal status: INITIAL  4.  Perform ADL's with LB and knee pain not to exceed 4/10. Baseline:  Goal status: INITIAL  PLAN: PT FREQUENCY: 2x/week  PT DURATION: 6 weeks  PLANNED INTERVENTIONS: Therapeutic exercises, Therapeutic activity, Neuromuscular re-education, Balance training, Gait training, Patient/Family education, Self Care, Electrical stimulation, Cryotherapy, Moist heat, Ultrasound, and Manual therapy  PLAN FOR NEXT SESSION: Postural improvement exercises, core exercise progression, Nustep.  Please monitor 02 sat and BP.  Aliea Bobe, [place], PT 11/10/2022, 12:38 PM

## 2022-11-14 ENCOUNTER — Ambulatory Visit (INDEPENDENT_AMBULATORY_CARE_PROVIDER_SITE_OTHER): Payer: Medicare Other | Admitting: Internal Medicine

## 2022-11-14 ENCOUNTER — Ambulatory Visit: Payer: Medicare Other | Admitting: Internal Medicine

## 2022-11-14 ENCOUNTER — Encounter: Payer: Self-pay | Admitting: Internal Medicine

## 2022-11-14 ENCOUNTER — Ambulatory Visit: Payer: Medicare Other

## 2022-11-14 VITALS — BP 132/68 | HR 74 | Temp 98.4°F | Ht 60.0 in | Wt 179.2 lb

## 2022-11-14 DIAGNOSIS — J9611 Chronic respiratory failure with hypoxia: Secondary | ICD-10-CM

## 2022-11-14 DIAGNOSIS — I152 Hypertension secondary to endocrine disorders: Secondary | ICD-10-CM

## 2022-11-14 DIAGNOSIS — R0609 Other forms of dyspnea: Secondary | ICD-10-CM | POA: Diagnosis not present

## 2022-11-14 DIAGNOSIS — E1159 Type 2 diabetes mellitus with other circulatory complications: Secondary | ICD-10-CM

## 2022-11-14 NOTE — Progress Notes (Unsigned)
Danielle Parrish, female    DOB: 12/22/1941   MRN: [id number]   Brief patient profile:  67   yowf never smoker  referred to pulmonary clinic 07/04/2022 by Dr Rexene Alberts  for cough p shoulder injury requiring GA and surgery in Aug of 2022.  [employer] shopping still able to do groceries leaning on cart / mb and back flat x 75 ft with walker   History of Present Illness  07/04/2022  Pulmonary/ 1st office eval/Danielle Parrish on Macrodantin Chief Complaint  Patient presents with   Consult    Pt states she has some SOB and a runny nose x1 year. Pt states she has Oxygen and CPAP machine but she does not use the CPAP machine.   Dyspnea:  mb and back slowly = MMRC3 = can't walk 100 yards even at a slow pace at a flat grade s stopping due to sob   Cough: voice fatigue  Sleep: cpap/02  SABA use: minimal better    Rec You will need to get the echocardiogram asap - call this office if can't schedule it within  a week  Stop macrodantin  Please schedule a follow up office visit in 3- 4 weeks, sooner if needed  - [facility]  - bring all medications Late add: pt instructed on use of amb 02 = goal is to keep > 90% saturations at all times     08/22/2022  f/u ov/Danielle Parrish office/Kandice Schmelter re: doe maint on ?  (Did not bring meds)   Chief Complaint  Patient presents with   Follow-up    Feels breathing is not doing well. Knees are bothering patient today.   Dyspnea:  walking mailbox and back but not checking sats despite "feeling breathing worse"  Cough: none but does have mild globus sensation on ACEi Sleeping: cpap/02 sleeping 0k SABA use: not really helping sob  02: not using x during the noct  Rec Stop lisinopril  and start olmesartan 20 mg on daily in its place to see if helps breathing Amlodipine may be making your swell up and need to reduce the dose if blood pressure too low  Make sure you check your oxygen saturation  AT  your highest level of activity (not after you stop)   to be sure it stays over  90%       11/14/2022  f/u ov/Danielle Parrish re: doe very mild, not long er cough off ACEi  Chief Complaint  Patient presents with   Follow-up    Breathing same since last ov.  Pt was very dizzy and had unsteady gait walking to room   Dyspnea:  limited more by balance than breathing / sats always low 90s Cough: sensation of globus/ also pnds  Sleeping: cpap and 02 per Danielle Parrish  SABA use:  only uses p exertion  02: just at hs      No obvious day to day or daytime variability or assoc excess/ purulent sputum or mucus plugs or hemoptysis or cp or chest tightness, subjective wheeze or overt sinus or hb symptoms.   Sleeping  without nocturnal  or early am exacerbation  of respiratory  c/o's or need for noct saba. Also denies any obvious fluctuation of symptoms with weather or environmental changes or other aggravating or alleviating factors except as outlined above   No unusual exposure hx or h/o childhood pna/ asthma or knowledge of premature birth.  Current Allergies, Complete Past Medical History, Past Surgical History, Family History, and Social History were reviewed in  [facility] electronic medical record.  ROS  The following are not active complaints unless bolded Hoarseness, sore throat, dysphagia, dental problems, itching, sneezing,  nasal congestion or discharge of excess watery mucus on astelin aleady or purulent secretions, ear ache,   fever, chills, sweats, unintended wt loss or wt gain, classically pleuritic or exertional cp,  orthopnea pnd or arm/hand swelling  or leg swelling, presyncope, palpitations, abdominal pain, anorexia, nausea, vomiting, diarrhea  or change in bowel habits or change in bladder habits, change in stools or change in urine, dysuria, hematuria,  rash, arthralgias, visual complaints, headache, numbness, weakness or ataxia or problems with walking or coordination,  change in mood or  memory.        Current Meds  Medication Sig   acetaminophen (TYLENOL) 500 MG  tablet Take 1 tablet (500 mg total) by mouth every 6 (six) hours as needed.   albuterol (VENTOLIN HFA) 108 (90 Base) MCG/ACT inhaler Take 2 puffs eveery 6 hours as needed for wheezing or shortness of breath   amLODipine (NORVASC) 10 MG tablet Take 1 tablet (10 mg total) by mouth daily.   azelastine (ASTELIN) 0.1 % nasal spray Place 1 spray into both nostrils 2 (two) times daily. Use in each nostril as directed   calcium-vitamin D (OSCAL WITH D) 500-200 MG-UNIT TABS tablet TAKE 1 TABLET BY MOUTH EVERY DAY WITH BREAKFAST   cetirizine (ZYRTEC) 10 MG tablet Take 1 tablet (10 mg total) by mouth daily.   Cholecalciferol (VITAMIN D-3 PO) Take 1 capsule by mouth daily.    cyclobenzaprine (FLEXERIL) 5 MG tablet Take 1 tablet (5 mg total) by mouth 3 (three) times daily as needed for muscle spasms.   diclofenac Sodium (VOLTAREN) 1 % GEL APPLY 4 GRAMS TOPICALLY 4 (FOUR) TIMES DAILY.   diphenhydrAMINE (BENADRYL) 25 MG tablet Take 50 mg by mouth at bedtime as needed for allergies.    erythromycin ophthalmic ointment Place 1 application. into both eyes 4 (four) times daily.   Ferrous Sulfate (IRON) 325 (65 Fe) MG TABS Take 1 tablet by mouth 3 (three) times daily.   gabapentin (NEURONTIN) 300 MG capsule TAKE 1 CAPSULE BY MOUTH 3 TIMES DAILY   ketoconazole (NIZORAL) 2 % cream APPLY TO AFFECTED AREA EVERYDAY   levothyroxine (SYNTHROID) 100 MCG tablet TAKE 1 TABLET BY MOUTH EVERY DAY BEFORE BREAKFAST   lidocaine (LIDODERM) 5 % PLACE 1 PATCH ONTO THE SKIN DAILY. REMOVE & DISCARD PATCH WITHIN 12 HOURS OR AS DIRECTED BY MD   lovastatin (MEVACOR) 40 MG tablet TAKE 1 TABLET BY MOUTH EVERYDAY AT BEDTIME   metoprolol succinate (TOPROL-XL) 25 MG 24 hr tablet Take 1 tablet (25 mg total) by mouth daily.   mirabegron ER (MYRBETRIQ) 25 MG TB24 tablet Take 1 tablet (25 mg total) by mouth daily.   Multiple Vitamin (MULTI-VITAMIN) tablet Take by mouth.   Multiple Vitamins-Minerals (PRESERVISION AREDS 2 PO) Take by mouth.    nystatin (MYCOSTATIN/NYSTOP) powder APPLY TO AFFECTED AREA 4 TIMES A DAY   olmesartan (BENICAR) 20 MG tablet Take 1 tablet (20 mg total) by mouth daily.   omeprazole (PRILOSEC) 20 MG capsule Take 1 capsule (20 mg total) by mouth 2 (two) times daily before a meal.   pyridOXINE (VITAMIN B-6) 100 MG tablet Take 100 mg by mouth daily.   sertraline (ZOLOFT) 100 MG tablet TAKE 2 TABLETS BY MOUTH EVERY DAY   sodium chloride (OCEAN) 0.65 % SOLN nasal spray Place 1 spray into both nostrils as needed for congestion.         Marland Kitchen  Past Medical History:  Diagnosis Date   Anxiety    Arthritis    CHF (congestive heart failure) (HCC)    Chronic pain of multiple joints    Depression    Essential hypertension    GERD (gastroesophageal reflux disease)    Hyperlipidemia    Hyperlipidemia associated with type 2 diabetes mellitus ([place])    Hypertension associated with type 2 diabetes mellitus ([place])    Hypothyroidism    Mild scoliosis 10/19/2021   Osteoporosis    Sleep apnea    CPAP   Type 2 diabetes mellitus with hypercholesterolemia (HCC)    Vitamin D deficiency        Objective:     11/14/2022     179   08/22/22 173 lb 12.8 oz (78.8 kg)  08/02/22 172 lb (78 kg)  07/04/22 173 lb 3.2 oz (78.6 kg)    Vital signs reviewed  11/14/2022  - Note at rest 02 sats  92% on RA   General appearance:    amb wf walks unsteady with cane         2/6 SEM/ no increase in P2, and ttrac  pitting both LEs***     HEENT : Oropharynx  clear      Nasal turbinates no swelling/ disharge or polyps   NECK :  without  apparent JVD/ palpable Nodes/TM    LUNGS: no acc muscle use,  Moderately severe kyphotic contour chest which is clear to A and P bilaterally without cough on insp or exp maneuvers   CV:  RRR  no s3  2-3/6 sem s increase in P2, and trace ankle   edema   ABD:  soft and nontender with nl inspiratory excursion in the supine position. No bruits or organomegaly appreciated   MS:  Nl gait/  ext warm without deformities Or obvious joint restrictions  calf tenderness, cyanosis or clubbing    SKIN: warm and dry without lesions    NEURO:  alert, approp, nl sensorium with  no motor or cerebellar deficits apparent.          I personally reviewed images and agree with radiology impression as follows:  CXR:   pa and lateral  10/11/2022  Mod severe T kyphosis/ minimal R effusion    Assessment

## 2022-11-14 NOTE — Patient Instructions (Addendum)
Only use your albuterol as a rescue medication to be used if you can't catch your breath by resting or doing a relaxed purse lip breathing pattern.  - The less you use it, the better it will work when you need it. - Ok to use up to 2 puffs  every 4 hours if you must but call for immediate appointment if use goes up over your usual need - Don't leave home without it !!  (think of it like the spare tire for your car)   Also  Ok to try albuterol 15 min before an activity (on alternating days)  that you know would usually make you short of breath and see if it makes any difference and if makes none then don't take albuterol after activity unless you can't catch your breath as this means it's the resting that helps, not the albuterol.  Dr Dettinger may wish to add a very low dose of diuretic to your next  olmesartan  refill    If you are satisfied with your treatment plan,  let your doctor know and he/she can either refill your medications or you can return here when your prescription runs out.     If in any way you are not 100% satisfied,  please tell us.  If 100% better, tell your friends!  Pulmonary follow up is as needed

## 2022-11-15 ENCOUNTER — Ambulatory Visit: Payer: Medicare Other

## 2022-11-15 ENCOUNTER — Encounter: Payer: Self-pay | Admitting: Internal Medicine

## 2022-11-15 DIAGNOSIS — M25561 Pain in right knee: Secondary | ICD-10-CM | POA: Diagnosis not present

## 2022-11-15 DIAGNOSIS — M545 Low back pain, unspecified: Secondary | ICD-10-CM | POA: Diagnosis not present

## 2022-11-15 DIAGNOSIS — M25562 Pain in left knee: Secondary | ICD-10-CM | POA: Diagnosis not present

## 2022-11-15 DIAGNOSIS — G8929 Other chronic pain: Secondary | ICD-10-CM | POA: Diagnosis not present

## 2022-11-15 NOTE — Assessment & Plan Note (Signed)
Onset Aug 2022  p GA for L shoulder surgery while on chronic macrodantin - macrodantin d/c 07/04/2022  - Echo 4/37/3578  1 diastolic dysfunction  Mild AS  - 08/22/2022 try off acei    Advised sub max ex/ continue to monitor sats at peak ex/ cards f/u per Dr D

## 2022-11-15 NOTE — Assessment & Plan Note (Signed)
Try off acei 08/22/2022 for globus /raspy voice > improved 11/14/2022   Although even in retrospect it may not be clear the ACEi contributed to the pt's symptoms,  Pt improved off them and adding them back at this point or in the future would risk confusion in interpretation of non-specific respiratory symptoms to which this patient is prone  ie  Better not to muddy the waters here.   Does have tendency to mild fluid retention - could add low dose hctz to olmesartan rx/ defer to Dr D and pumonary f/u is prn         Each maintenance medication was reviewed in detail including emphasizing most importantly the difference between maintenance and prns and under what circumstances the prns are to be triggered using an action plan format where appropriate.  Total time for H and P, chart review, counseling,   and generating customized AVS unique to this office visit / same day charting = 28 min final summary f/u ov

## 2022-11-15 NOTE — Assessment & Plan Note (Signed)
Desat  on ambulation 07/04/2022  > d/c'd macrodantin  - 07/04/2022   Walked on RA  x  2  lap(s) =  approx 500  ft  @ slow pace, stopped due to desats/sob with lowest 02 sats 80% > rec use portable 02 to maintain > 90% (already had it at time of ov but didn't bring it with her)    - 08/22/2022   Walked on RA  x  2  lap(s) =  approx 300  ft  @ slow/cane pace, stopped due to knee pain> sob  with lowest 02 sats 88%   - 11/14/2022 no longer desats walking as of  11/14/2022   Serial improvement off macrodatin is c/w low grade macrodantin toxicity > avoid in future /advised

## 2022-11-15 NOTE — Therapy (Signed)
OUTPATIENT PHYSICAL THERAPY LOWER EXTREMITY TREATMENT   Patient Name: Danielle Parrish MRN: [id number] DOB:1941/06/25, 81 y.o., female Today's Date: 11/15/2022   PT End of Session - 11/15/22 1121     Visit Number 7    Number of Visits 12    Date for PT Re-Evaluation 12/12/22    Authorization Type PROGRESS NOTE AT 10TH VISIT.  KX MODIFIER AFTER 15 VISITS.    PT Start Time 1115    PT Stop Time 1159    PT Time Calculation (min) 44 min    Activity Tolerance Patient tolerated treatment well    Behavior During Therapy WFL for tasks assessed/performed            Past Medical History:  Diagnosis Date   Anxiety    Arthritis    CHF (congestive heart failure) (HCC)    Chronic pain of multiple joints    Depression    Essential hypertension    GERD (gastroesophageal reflux disease)    Hyperlipidemia    Hyperlipidemia associated with type 2 diabetes mellitus ([place])    Hypertension associated with type 2 diabetes mellitus ([place])    Hypothyroidism    Mild scoliosis 10/19/2021   Osteoporosis    Sleep apnea    CPAP   Type 2 diabetes mellitus with hypercholesterolemia ([place])    Vitamin D deficiency    Past Surgical History:  Procedure Laterality Date   ABDOMINAL HERNIA REPAIR     ABDOMINAL HYSTERECTOMY  1977   BREAST EXCISIONAL BIOPSY Right    EYE SURGERY  2020   bilateral cataract removal    KNEE ARTHROSCOPY Left 09/30/2014   Procedure: LEFT KNEE ARTHROSCOPY MEDIAL MENISECTOMY ABRASION CHONDROPLASTY SYNOVECTOMY SUPRAPATELLER CUFF;  Surgeon: Tobi Bastos, MD;  Location: [facility];  Service: Orthopedics;  Laterality: Left;   Patient Active Problem List   Diagnosis Date Noted   Chronic respiratory failure with hypoxia ([place]) 07/10/2022   DOE (dyspnea on exertion) 07/04/2022   Breast pain, left 02/01/2022   Generalized abdominal pain 02/01/2022   Chronic bilateral back pain 10/28/2021   Chronic midline low back pain without sciatica 01/30/2019   Age-related osteoporosis with  current pathol fracture of vertebra ([place]) 10/28/2018   Calcification of aorta (HCC) 10/02/2018   Compression fracture of T12 vertebra ([place]) 10/01/2018   Primary osteoarthritis of right knee 07/20/2017   Varicose veins of left lower extremity with complications 64/40/3474   OSA (obstructive sleep apnea) 09/14/2015   Chronic diastolic heart failure ([place]) 09/10/2015   Orthopnea 09/02/2015   Dysphagia, pharyngoesophageal phase 08/04/2015   Anorexia 08/04/2015   Fatigue 08/04/2015   Malaise and fatigue 08/04/2015   Depression, recurrent ([place]) 04/08/2015   Vitamin D deficiency    Anxiety    GERD (gastroesophageal reflux disease)    Arthritis    Hypertension associated with type 2 diabetes mellitus ([place]) 05/15/2013   Hyperlipidemia associated with type 2 diabetes mellitus ([place]) 05/15/2013   Type 2 diabetes mellitus with hypercholesterolemia ([place]) 05/15/2013   Morbid obesity ([place]) 05/15/2013   Hypothyroidism 05/15/2013   REFERRING PROVIDER: Vonna Kotyk Dettinger MD  REFERRING DIAG: OA right knee, DDD lumbar.  THERAPY DIAG:  Chronic pain of right knee  Chronic pain of left knee  Chronic bilateral low back pain without sciatica  Rationale for Evaluation and Treatment Rehabilitation  ONSET DATE: Ongoing.  SUBJECTIVE:   SUBJECTIVE STATEMENT: Pt reports low back pain today.   PERTINENT HISTORY: OA, left knee surgery, HTN, DM, OP, DOE.  PAIN:  Are you having pain? Yes:  NPRS scale: 7/10 Pain location: B lumbar Pain description: Discomfort Aggravating factors: Unknown Relieving factors: Unknown    PRECAUTIONS: Other: Monitor 02 and BP please.  PATIENT GOALS:  Increased activity level.  OBJECTIVE:                                     EXERCISE LOG 11/15/22:  Exercise Repetitions and Resistance Comments  Nustep  L4 x18 minutes   Back ext 30# Pt opted to not perform   Ham curls 30# x 3.5 minutes   Knee ext 10# x 3.5 minutes.   Rockerboard X4 mins   Standing Marches X3 mins  BUE support         ASSESSMENT:  CLINICAL IMPRESSION: Pt arrives for today's treatment session reporting 7/10 low back pain. PT able to tolerate increased resistance on Nustep today without issue or complaint.  Pt also able to tolerate increased time on cybex knee flexion and extension machine, but declines performance of cybex back extension due to discomfort.  Pt's SpO2 monitored throughout treatment with sats remaining above 89% on RA during session. Pt denied any increased in pain at completion of today's treatment session.  OBJECTIVE IMPAIRMENTS Abnormal gait, decreased endurance, decreased ROM, decreased strength, increased muscle spasms, postural dysfunction, and pain.   ACTIVITY LIMITATIONS carrying, lifting, bending, and locomotion level  PARTICIPATION LIMITATIONS: meal prep, cleaning, and laundry  PERSONAL FACTORS Time since onset of injury/illness/exacerbation are also affecting patient's functional outcome.   REHAB POTENTIAL: Good  CLINICAL DECISION MAKING: Evolving/moderate complexity  EVALUATION COMPLEXITY: Moderate  GOALS:  SHORT TERM GOALS: Target date: 11/29/2022  Ind with initial HEP. Baseline: Goal status: INITIAL  LONG TERM GOALS: Target date: 11/06/2022   Ind with advanced HEP. Baseline:  Goal status: INITIAL  2.  Improve TUG by 6 seconds Baseline:  Goal status: INITIAL  3.  Improve 5 time sit to stand by 6 seconds. Baseline:  Goal status: INITIAL  4.  Perform ADL's with LB and knee pain not to exceed 4/10. Baseline:  Goal status: INITIAL  PLAN: PT FREQUENCY: 2x/week  PT DURATION: 6 weeks  PLANNED INTERVENTIONS: Therapeutic exercises, Therapeutic activity, Neuromuscular re-education, Balance training, Gait training, Patient/Family education, Self Care, Electrical stimulation, Cryotherapy, Moist heat, Ultrasound, and Manual therapy  PLAN FOR NEXT SESSION: Postural improvement exercises, core exercise progression, Nustep.  Please monitor 02 sat  and BP.  Kathrynn Ducking, PTA 11/15/2022, 12:06 PM

## 2022-11-17 ENCOUNTER — Other Ambulatory Visit: Payer: Self-pay | Admitting: Internal Medicine

## 2022-11-17 DIAGNOSIS — R0902 Hypoxemia: Secondary | ICD-10-CM | POA: Diagnosis not present

## 2022-11-22 ENCOUNTER — Ambulatory Visit: Payer: Medicare Other | Admitting: Internal Medicine

## 2022-11-22 ENCOUNTER — Telehealth: Payer: Self-pay | Admitting: Internal Medicine

## 2022-11-22 MED ORDER — RSVPREF3 VAC RECOMB ADJUVANTED 120 MCG/0.5ML IM SUSR: 0.5000 mL | Freq: Once | INTRAMUSCULAR | 0 refills | Status: AC

## 2022-11-22 NOTE — Telephone Encounter (Signed)
Attempted to call pt but unable to reach and unable to leave VM. Will try to call back later. 

## 2022-11-22 NOTE — Progress Notes (Deleted)
Danielle Parrish, female    DOB: 1941-07-28   MRN: [id number]   Brief patient profile:  81   yowf never smoker  referred to pulmonary clinic 07/04/2022 by Dr Rexene Alberts  for cough p shoulder injury requiring GA and surgery in Aug of 2022.  [employer] shopping still able to do groceries leaning on cart / mb and back flat x 75 ft with walker   History of Present Illness  07/04/2022  Pulmonary/ 1st office eval/Kynnadi Dicenso on Macrodantin Chief Complaint  Patient presents with   Consult    Pt states she has some SOB and a runny nose x1 year. Pt states she has Oxygen and CPAP machine but she does not use the CPAP machine.   Dyspnea:  mb and back slowly = MMRC3 = can't walk 100 yards even at a slow pace at a flat grade s stopping due to sob   Cough: voice fatigue  Sleep: cpap/02  SABA use: minimal better    Rec You will need to get the echocardiogram asap - call this office if can't schedule it within  a week  Stop macrodantin  Please schedule a follow up office visit in 3- 4 weeks, sooner if needed  - [facility]  - bring all medications Late add: pt instructed on use of amb 02 = goal is to keep > 90% saturations at all times     08/22/2022  f/u ov/[place] office/Janecia Palau re: doe maint on ?  (Did not bring meds)   Chief Complaint  Patient presents with   Follow-up    Feels breathing is not doing well. Knees are bothering patient today.   Dyspnea:  walking mailbox and back but not checking sats despite "feeling breathing worse"  Cough: none but does have mild globus sensation on ACEi Sleeping: cpap/02 sleeping 0k SABA use: not really helping sob  02: not using x during the noct  Rec Stop lisinopril  and start olmesartan 20 mg on daily in its place to see if helps breathing Amlodipine may be making your swell up and need to reduce the dose if blood pressure too low  Make sure you check your oxygen saturation  AT  your highest level of activity (not after you stop)   to be sure it stays over  90%       11/14/2022  f/u ov/Talynn Lebon re: doe very mild, not long er cough off ACEi  Chief Complaint  Patient presents with   Follow-up    Breathing same since last ov.  Pt was very dizzy and had unsteady gait walking to room   Dyspnea:  limited more by balance than breathing / sats always low 90s even with max walking  Cough: sensation of globus/ also pnds  Sleeping: cpap and 02 per [place]  SABA use:  only uses p exertion  02: just at hs  Rec Only use your albuterol as a rescue medication to be used if you can't catch your breath  Also  Ok to try albuterol 15 min before an activity (on alternating days)  that you know would usually make you short of breath  Dr Warrick Parisian may wish to add a very low dose of diuretic to your next  olmesartan  refill   11/22/2022  f/u ov/Desta Bujak re: ***   maint on ***  No chief complaint on file.   Dyspnea:  *** Cough: *** Sleeping: *** SABA use: *** 02: *** Covid status:   *** Lung cancer screening :  ***  No obvious day to day or daytime variability or assoc excess/ purulent sputum or mucus plugs or hemoptysis or cp or chest tightness, subjective wheeze or overt sinus or hb symptoms.   *** without nocturnal  or early am exacerbation  of respiratory  c/o's or need for noct saba. Also denies any obvious fluctuation of symptoms with weather or environmental changes or other aggravating or alleviating factors except as outlined above   No unusual exposure hx or h/o childhood pna/ asthma or knowledge of premature birth.  Current Allergies, Complete Past Medical History, Past Surgical History, Family History, and Social History were reviewed in [employer] record.  ROS  The following are not active complaints unless bolded Hoarseness, sore throat, dysphagia, dental problems, itching, sneezing,  nasal congestion or discharge of excess mucus or purulent secretions, ear ache,   fever, chills, sweats, unintended wt loss or wt gain,  classically pleuritic or exertional cp,  orthopnea pnd or arm/hand swelling  or leg swelling, presyncope, palpitations, abdominal pain, anorexia, nausea, vomiting, diarrhea  or change in bowel habits or change in bladder habits, change in stools or change in urine, dysuria, hematuria,  rash, arthralgias, visual complaints, headache, numbness, weakness or ataxia or problems with walking or coordination,  change in mood or  memory.        No outpatient medications have been marked as taking for the 11/22/22 encounter (Appointment) with Tanda Rockers, MD.           .            Past Medical History:  Diagnosis Date   Anxiety    Arthritis    CHF (congestive heart failure) (HCC)    Chronic pain of multiple joints    Depression    Essential hypertension    GERD (gastroesophageal reflux disease)    Hyperlipidemia    Hyperlipidemia associated with type 2 diabetes mellitus ([place])    Hypertension associated with type 2 diabetes mellitus ([place])    Hypothyroidism    Mild scoliosis 10/19/2021   Osteoporosis    Sleep apnea    CPAP   Type 2 diabetes mellitus with hypercholesterolemia (HCC)    Vitamin D deficiency        Objective:    Wts  11/22/2022      *** 11/14/2022     179   08/22/22 173 lb 12.8 oz (78.8 kg)  08/02/22 172 lb (78 kg)  07/04/22 173 lb 3.2 oz (78.6 kg)    Vital signs reviewed  11/22/2022  - Note at rest 02 sats  ***% on ***   General appearance:    ***         Moderately severe kyphotic contour chest   2-3/6 sem s increase in P2, and trace ankle   edema ***               Assessment

## 2022-11-22 NOTE — Telephone Encounter (Signed)
Called and spoke with pt letting her know that our providers are recommending patients to get the RSV vaccine and she verbalized understanding. Rx has been sent to pharmacy for pt at her request. Nothing further needed.

## 2022-11-24 ENCOUNTER — Other Ambulatory Visit: Payer: Self-pay | Admitting: Family Medicine

## 2022-11-28 ENCOUNTER — Ambulatory Visit: Payer: Medicare Other | Attending: Family Medicine | Admitting: Physical Therapy

## 2022-11-28 VITALS — BP 135/71

## 2022-11-28 DIAGNOSIS — R293 Abnormal posture: Secondary | ICD-10-CM | POA: Diagnosis not present

## 2022-11-28 DIAGNOSIS — M545 Low back pain, unspecified: Secondary | ICD-10-CM

## 2022-11-28 DIAGNOSIS — M25562 Pain in left knee: Secondary | ICD-10-CM | POA: Insufficient documentation

## 2022-11-28 DIAGNOSIS — M25561 Pain in right knee: Secondary | ICD-10-CM | POA: Diagnosis not present

## 2022-11-28 DIAGNOSIS — G8929 Other chronic pain: Secondary | ICD-10-CM | POA: Diagnosis not present

## 2022-11-28 DIAGNOSIS — M6281 Muscle weakness (generalized): Secondary | ICD-10-CM

## 2022-11-28 NOTE — Therapy (Signed)
OUTPATIENT PHYSICAL THERAPY LOWER EXTREMITY TREATMENT   Patient Name: Danielle Parrish MRN: [id number] DOB:04-20-1941, 81 y.o., female Today's Date: 11/28/2022   PT End of Session - 11/28/22 1235     Visit Number 8    Number of Visits 12    Date for PT Re-Evaluation 12/12/22    Authorization Type PROGRESS NOTE AT 10TH VISIT.  KX MODIFIER AFTER 15 VISITS.    PT Start Time 1117    PT Stop Time 1159    PT Time Calculation (min) 42 min    Activity Tolerance Patient tolerated treatment well    Behavior During Therapy WFL for tasks assessed/performed            Past Medical History:  Diagnosis Date   Anxiety    Arthritis    CHF (congestive heart failure) (HCC)    Chronic pain of multiple joints    Depression    Essential hypertension    GERD (gastroesophageal reflux disease)    Hyperlipidemia    Hyperlipidemia associated with type 2 diabetes mellitus ([place])    Hypertension associated with type 2 diabetes mellitus ([place])    Hypothyroidism    Mild scoliosis 10/19/2021   Osteoporosis    Sleep apnea    CPAP   Type 2 diabetes mellitus with hypercholesterolemia ([place])    Vitamin D deficiency    Past Surgical History:  Procedure Laterality Date   ABDOMINAL HERNIA REPAIR     ABDOMINAL HYSTERECTOMY  1977   BREAST EXCISIONAL BIOPSY Right    EYE SURGERY  2020   bilateral cataract removal    KNEE ARTHROSCOPY Left 09/30/2014   Procedure: LEFT KNEE ARTHROSCOPY MEDIAL MENISECTOMY ABRASION CHONDROPLASTY SYNOVECTOMY SUPRAPATELLER CUFF;  Surgeon: Tobi Bastos, MD;  Location: [facility];  Service: Orthopedics;  Laterality: Left;   Patient Active Problem List   Diagnosis Date Noted   Chronic respiratory failure with hypoxia ([place]) 07/10/2022   DOE (dyspnea on exertion) 07/04/2022   Breast pain, left 02/01/2022   Generalized abdominal pain 02/01/2022   Chronic bilateral back pain 10/28/2021   Chronic midline low back pain without sciatica 01/30/2019   Age-related osteoporosis with  current pathol fracture of vertebra ([place]) 10/28/2018   Calcification of aorta (HCC) 10/02/2018   Compression fracture of T12 vertebra ([place]) 10/01/2018   Primary osteoarthritis of right knee 07/20/2017   Varicose veins of left lower extremity with complications 13/24/4010   OSA (obstructive sleep apnea) 09/14/2015   Chronic diastolic heart failure ([place]) 09/10/2015   Orthopnea 09/02/2015   Dysphagia, pharyngoesophageal phase 08/04/2015   Anorexia 08/04/2015   Fatigue 08/04/2015   Malaise and fatigue 08/04/2015   Depression, recurrent ([place]) 04/08/2015   Vitamin D deficiency    Anxiety    GERD (gastroesophageal reflux disease)    Arthritis    Hypertension associated with type 2 diabetes mellitus ([place]) 05/15/2013   Hyperlipidemia associated with type 2 diabetes mellitus ([place]) 05/15/2013   Type 2 diabetes mellitus with hypercholesterolemia ([place]) 05/15/2013   Morbid obesity ([place]) 05/15/2013   Hypothyroidism 05/15/2013   REFERRING PROVIDER: Vonna Kotyk Dettinger MD  REFERRING DIAG: OA right knee, DDD lumbar.  THERAPY DIAG:  Chronic pain of right knee  Chronic pain of left knee  Chronic bilateral low back pain without sciatica  Abnormal posture  Muscle weakness (generalized)  Rationale for Evaluation and Treatment Rehabilitation  ONSET DATE: Ongoing.  SUBJECTIVE:   SUBJECTIVE STATEMENT: Pt reports low back pain today.   PERTINENT HISTORY: OA, left knee surgery, HTN, DM, OP, DOE.  PAIN:  Are you having pain? Yes: NPRS scale: 7/10 Pain location: B lumbar Pain description: Discomfort Aggravating factors: Unknown Relieving factors: Unknown    PRECAUTIONS: Other: Monitor 02 and BP please.  PATIENT GOALS:  Increased activity level.  OBJECTIVE:                                     EXERCISE LOG 11/15/22:  Exercise Repetitions and Resistance Comments  Nustep  L2-3 x16 minutes   Back ext 30# Pt opted to not perform   Ham curls 30# x 2 minutes   Knee ext 10# x 2 minutes.    Rockerboard X3 mins forward/back and 3 minutes side to side.             ASSESSMENT:  CLINICAL IMPRESSION: Patient feeling tired today.  02 sat at 91%.  She did well with there ex and balance activities.    OBJECTIVE IMPAIRMENTS Abnormal gait, decreased endurance, decreased ROM, decreased strength, increased muscle spasms, postural dysfunction, and pain.   ACTIVITY LIMITATIONS carrying, lifting, bending, and locomotion level  PARTICIPATION LIMITATIONS: meal prep, cleaning, and laundry  PERSONAL FACTORS Time since onset of injury/illness/exacerbation are also affecting patient's functional outcome.   REHAB POTENTIAL: Good  CLINICAL DECISION MAKING: Evolving/moderate complexity  EVALUATION COMPLEXITY: Moderate  GOALS:  SHORT TERM GOALS: Target date: 12/12/2022  Ind with initial HEP. Baseline: Goal status: INITIAL  LONG TERM GOALS: Target date: 11/06/2022   Ind with advanced HEP. Baseline:  Goal status: INITIAL  2.  Improve TUG by 6 seconds Baseline:  Goal status: INITIAL  3.  Improve 5 time sit to stand by 6 seconds. Baseline:  Goal status: INITIAL  4.  Perform ADL's with LB and knee pain not to exceed 4/10. Baseline:  Goal status: INITIAL  PLAN: PT FREQUENCY: 2x/week  PT DURATION: 6 weeks  PLANNED INTERVENTIONS: Therapeutic exercises, Therapeutic activity, Neuromuscular re-education, Balance training, Gait training, Patient/Family education, Self Care, Electrical stimulation, Cryotherapy, Moist heat, Ultrasound, and Manual therapy  PLAN FOR NEXT SESSION: Postural improvement exercises, core exercise progression, Nustep.  Please monitor 02 sat and BP.  Holton Sidman, [place], PT 11/28/2022, 12:42 PM

## 2022-12-01 ENCOUNTER — Telehealth: Payer: Self-pay | Admitting: Family Medicine

## 2022-12-01 NOTE — Telephone Encounter (Signed)
Phone number busy

## 2022-12-01 NOTE — Telephone Encounter (Signed)
Pt states that her legs and feet are swollen. Goes down very little at night. She does not take a fluid pill. States that her BP is good. Pt is using O2. States that it was a struggle going to the grocery store. Advised pt to wear support stockings, elevated feet with pillow in chair and in bed at night. Increase water intake as well. Offered to make an appt with Dettinger on Monday but she is unable to come in then because of transportation. Advised pt to go to ER if she becomes more fatigued, sob, chest pain, nausea, vomiting. Pt understood.

## 2022-12-05 ENCOUNTER — Ambulatory Visit: Payer: Medicare Other | Admitting: Physical Therapy

## 2022-12-05 ENCOUNTER — Encounter: Payer: Self-pay | Admitting: Family Medicine

## 2022-12-05 ENCOUNTER — Ambulatory Visit (INDEPENDENT_AMBULATORY_CARE_PROVIDER_SITE_OTHER): Payer: Medicare Other | Admitting: Family Medicine

## 2022-12-05 ENCOUNTER — Encounter: Payer: Self-pay | Admitting: Physical Therapy

## 2022-12-05 VITALS — BP 154/77 | HR 77 | Temp 97.0°F | Ht 61.0 in | Wt 181.4 lb

## 2022-12-05 DIAGNOSIS — R2243 Localized swelling, mass and lump, lower limb, bilateral: Secondary | ICD-10-CM | POA: Diagnosis not present

## 2022-12-05 DIAGNOSIS — M6281 Muscle weakness (generalized): Secondary | ICD-10-CM | POA: Diagnosis not present

## 2022-12-05 DIAGNOSIS — K5901 Slow transit constipation: Secondary | ICD-10-CM

## 2022-12-05 DIAGNOSIS — G8929 Other chronic pain: Secondary | ICD-10-CM | POA: Diagnosis not present

## 2022-12-05 DIAGNOSIS — R293 Abnormal posture: Secondary | ICD-10-CM | POA: Diagnosis not present

## 2022-12-05 DIAGNOSIS — N3000 Acute cystitis without hematuria: Secondary | ICD-10-CM

## 2022-12-05 DIAGNOSIS — R109 Unspecified abdominal pain: Secondary | ICD-10-CM

## 2022-12-05 DIAGNOSIS — M545 Low back pain, unspecified: Secondary | ICD-10-CM | POA: Diagnosis not present

## 2022-12-05 DIAGNOSIS — M25561 Pain in right knee: Secondary | ICD-10-CM | POA: Diagnosis not present

## 2022-12-05 DIAGNOSIS — M25562 Pain in left knee: Secondary | ICD-10-CM | POA: Diagnosis not present

## 2022-12-05 LAB — URINALYSIS, COMPLETE
Bilirubin, UA: NEGATIVE
Glucose, UA: NEGATIVE
Ketones, UA: NEGATIVE
Nitrite, UA: NEGATIVE
Protein,UA: NEGATIVE
Specific Gravity, UA: 1.01 (ref 1.005–1.030)
Urobilinogen, Ur: 0.2 mg/dL (ref 0.2–1.0)
pH, UA: 6 (ref 5.0–7.5)

## 2022-12-05 LAB — MICROSCOPIC EXAMINATION
RBC, Urine: NONE SEEN /hpf (ref 0–2)
Renal Epithel, UA: NONE SEEN /hpf

## 2022-12-05 MED ORDER — FUROSEMIDE 20 MG PO TABS: 20.0000 mg | ORAL_TABLET | Freq: Every day | ORAL | 3 refills | Status: DC

## 2022-12-05 MED ORDER — POLYETHYLENE GLYCOL 3350 17 GM/SCOOP PO POWD: 17.0000 g | Freq: Every day | ORAL | 1 refills | Status: DC

## 2022-12-05 NOTE — Patient Instructions (Signed)
Take lasix next three days and then only daily as needed for lower extremity swelling.

## 2022-12-05 NOTE — Progress Notes (Signed)
Subjective:  Patient ID: Danielle Parrish, female    DOB: July 17, 1941, 81 y.o.   MRN: [id number]  Patient Care Team: Dettinger, Fransisca Kaufmann, MD as PCP - General (Family Medicine) Sandford Craze, MD as Referring Physician (Dermatology) Calvert Cantor, MD as Consulting Physician (Ophthalmology) Steffanie Rainwater, DPM as Consulting Physician (Podiatry) Latanya Maudlin, MD as Consulting Physician (Orthopedic Surgery)   Chief Complaint:  Leg Swelling (Bilateral lower leg swelling x 2 weeks ) and Back Pain   HPI: Danielle Parrish is a 81 y.o. female presenting on 12/05/2022 for Leg Swelling (Bilateral lower leg swelling x 2 weeks ) and Back Pain   Pt presents today for evaluation of bilateral lower leg swelling. She reports this started 2 weeks ago. Denies other associated symptoms such as chest pain, palpitations, cough, orthopnea, PND, or shortness of breath. She is on high dose amlodipine and gabapentin. She does not elevate feet when sitting or wear compression stockings. She also reports intermittent right flank pain, no dysuria or hematuria. No fever, chills, weakness, or confusion. States her bowel movements have been hard over the last several days. No melena or hematochezia. Has not taken anything to help with this.     Relevant past medical, surgical, family, and social history reviewed and updated as indicated.  Allergies and medications reviewed and updated. Data reviewed: Chart in Epic.   Past Medical History:  Diagnosis Date   Anxiety    Arthritis    CHF (congestive heart failure) (HCC)    Chronic pain of multiple joints    Depression    Essential hypertension    GERD (gastroesophageal reflux disease)    Hyperlipidemia    Hyperlipidemia associated with type 2 diabetes mellitus ([place])    Hypertension associated with type 2 diabetes mellitus ([place])    Hypothyroidism    Mild scoliosis 10/19/2021   Osteoporosis    Sleep apnea    CPAP   Type 2 diabetes mellitus with  hypercholesterolemia ([place])    Vitamin D deficiency     Past Surgical History:  Procedure Laterality Date   ABDOMINAL HERNIA REPAIR     ABDOMINAL HYSTERECTOMY  1977   BREAST EXCISIONAL BIOPSY Right    EYE SURGERY  2020   bilateral cataract removal    KNEE ARTHROSCOPY Left 09/30/2014   Procedure: LEFT KNEE ARTHROSCOPY MEDIAL MENISECTOMY ABRASION CHONDROPLASTY SYNOVECTOMY SUPRAPATELLER CUFF;  Surgeon: Tobi Bastos, MD;  Location: [facility];  Service: Orthopedics;  Laterality: Left;    Social History   Socioeconomic History   Marital status: Widowed    Spouse name: Laverna Peace   Number of children: 2   Years of education: Not on file   Highest education level: High school graduate  Occupational History   Occupation: CNA    Comment: [employer]   Occupation: retired  Tobacco Use   Smoking status: Never    Passive exposure: Past   Smokeless tobacco: Never  Vaping Use   Vaping Use: Never used  Substance and Sexual Activity   Alcohol use: No   Drug use: No   Sexual activity: Not Currently  Other Topics Concern   Not on file  Social History Narrative   Lives with her daughter   Not very active socially or physically   Social Determinants of Health   Financial Resource Strain: Low Risk  (08/02/2022)   Overall Financial Resource Strain (CARDIA)    Difficulty of Paying Living Expenses: Not very hard  Food Insecurity: No Food Insecurity (08/02/2022)  Hunger Vital Sign    Worried About Running Out of Food in the Last Year: Never true    Ran Out of Food in the Last Year: Never true  Transportation Needs: No Transportation Needs (08/02/2022)   PRAPARE - Hydrologist (Medical): No    Lack of Transportation (Non-Medical): No  Physical Activity: Inactive (08/02/2022)   Exercise Vital Sign    Days of Exercise per Week: 0 days    Minutes of Exercise per Session: 0 min  Stress: No Stress Concern Present (08/02/2022)   [place]    Feeling of Stress : Not at all  Social Connections: Socially Isolated (08/02/2022)   Social Connection and Isolation Panel [NHANES]    Frequency of Communication with Friends and Family: More than three times a week    Frequency of Social Gatherings with Friends and Family: More than three times a week    Attends Religious Services: Never    Marine scientist or Organizations: No    Attends Archivist Meetings: Never    Marital Status: Widowed  Intimate Partner Violence: Not At Risk (08/02/2022)   Humiliation, Afraid, Rape, and Kick questionnaire    Fear of Current or Ex-Partner: No    Emotionally Abused: No    Physically Abused: No    Sexually Abused: No    Outpatient Encounter Medications as of 12/05/2022  Medication Sig   acetaminophen (TYLENOL) 500 MG tablet Take 1 tablet (500 mg total) by mouth every 6 (six) hours as needed.   albuterol (VENTOLIN HFA) 108 (90 Base) MCG/ACT inhaler Take 2 puffs eveery 6 hours as needed for wheezing or shortness of breath   amLODipine (NORVASC) 10 MG tablet Take 1 tablet (10 mg total) by mouth daily.   azelastine (ASTELIN) 0.1 % nasal spray Place 1 spray into both nostrils 2 (two) times daily. Use in each nostril as directed   calcium-vitamin D (OSCAL WITH D) 500-200 MG-UNIT TABS tablet TAKE 1 TABLET BY MOUTH EVERY DAY WITH BREAKFAST   cetirizine (ZYRTEC) 10 MG tablet Take 1 tablet (10 mg total) by mouth daily.   Cholecalciferol (VITAMIN D-3 PO) Take 1 capsule by mouth daily.    diclofenac Sodium (VOLTAREN) 1 % GEL APPLY 4 GRAMS TOPICALLY 4 (FOUR) TIMES DAILY.   diphenhydrAMINE (BENADRYL) 25 MG tablet Take 50 mg by mouth at bedtime as needed for allergies.    erythromycin ophthalmic ointment Place 1 application. into both eyes 4 (four) times daily.   Ferrous Sulfate (IRON) 325 (65 Fe) MG TABS Take 1 tablet by mouth 3 (three) times daily.   furosemide (LASIX) 20 MG tablet Take 1 tablet (20 mg total) by  mouth daily.   gabapentin (NEURONTIN) 300 MG capsule TAKE 1 CAPSULE BY MOUTH 3 TIMES DAILY   ketoconazole (NIZORAL) 2 % cream APPLY TO AFFECTED AREA EVERYDAY   levothyroxine (SYNTHROID) 100 MCG tablet TAKE 1 TABLET BY MOUTH EVERY DAY BEFORE BREAKFAST   lidocaine (LIDODERM) 5 % PLACE 1 PATCH ONTO THE SKIN DAILY. REMOVE & DISCARD PATCH WITHIN 12 HOURS OR AS DIRECTED BY MD   lovastatin (MEVACOR) 40 MG tablet TAKE 1 TABLET BY MOUTH EVERYDAY AT BEDTIME   metoprolol succinate (TOPROL-XL) 25 MG 24 hr tablet Take 1 tablet (25 mg total) by mouth daily.   mirabegron ER (MYRBETRIQ) 25 MG TB24 tablet Take 1 tablet (25 mg total) by mouth daily.   Multiple Vitamin (MULTI-VITAMIN) tablet Take by  mouth.   Multiple Vitamins-Minerals (PRESERVISION AREDS 2 PO) Take by mouth.   nystatin (MYCOSTATIN/NYSTOP) powder APPLY TO AFFECTED AREA 4 TIMES A DAY   nystatin cream (MYCOSTATIN) APPLY TO AFFECTED AREA TWICE A DAY   olmesartan (BENICAR) 20 MG tablet TAKE 1 TABLET BY MOUTH EVERY DAY   omeprazole (PRILOSEC) 20 MG capsule Take 1 capsule (20 mg total) by mouth 2 (two) times daily before a meal.   polyethylene glycol powder (GLYCOLAX/MIRALAX) 17 GM/SCOOP powder Take 17 g by mouth daily.   pyridOXINE (VITAMIN B-6) 100 MG tablet Take 100 mg by mouth daily.   sertraline (ZOLOFT) 100 MG tablet TAKE 2 TABLETS BY MOUTH EVERY DAY   sodium chloride (OCEAN) 0.65 % SOLN nasal spray Place 1 spray into both nostrils as needed for congestion.   cyclobenzaprine (FLEXERIL) 5 MG tablet Take 1 tablet (5 mg total) by mouth 3 (three) times daily as needed for muscle spasms. (Patient not taking: Reported on 12/05/2022)   No facility-administered encounter medications on file as of 12/05/2022.    Allergies  Allergen Reactions   Nsaids     Burns stomach.    Penicillins Hives and Other (See Comments)    Has patient had a PCN reaction causing immediate rash, facial/tongue/throat swelling, SOB or lightheadedness with hypotension:  Yes  Has patient had a PCN reaction causing severe rash involving mucus membranes or skin necrosis: unknown  Has patient had a PCN reaction that required hospitalization No  Has patient had a PCN reaction occurring within the last 10 years: No  If all of the above answers are "NO", then may proceed with Cephalosporin use.   Rosuvastatin Other (See Comments)    Body aches.   Statins Other (See Comments)   Vytorin [Ezetimibe-Simvastatin]     Body aches.    Aspirin Nausea Only   Tolmetin Nausea Only    Burns stomach.     Review of Systems  Respiratory:  Negative for apnea, cough, choking, chest tightness, shortness of breath, wheezing and stridor.   All other systems reviewed and are negative.       Objective:  BP (!) 154/77   Pulse 77   Temp (!) 97 F (36.1 C) (Temporal)   Ht _0  (1.549 m)   Wt 181 lb 6.4 oz (82.3 kg)   SpO2 90%   BMI 34.28 kg/m    Wt Readings from Last 3 Encounters:  12/05/22 181 lb 6.4 oz (82.3 kg)  11/14/22 179 lb 3.2 oz (81.3 kg)  10/11/22 176 lb (79.8 kg)    Physical Exam Vitals and nursing note reviewed.  Constitutional:      General: She is not in acute distress.    Appearance: Normal appearance. She is well-developed and well-groomed. She is obese. She is not ill-appearing, toxic-appearing or diaphoretic.  HENT:     Head: Normocephalic and atraumatic.     Jaw: There is normal jaw occlusion.     Right Ear: Hearing normal.     Left Ear: Hearing normal.     Nose: Nose normal.     Mouth/Throat:     Lips: Pink.     Mouth: Mucous membranes are moist.     Pharynx: Oropharynx is clear. Uvula midline.  Eyes:     General: Lids are normal.     Extraocular Movements: Extraocular movements intact.     Conjunctiva/sclera: Conjunctivae normal.     Pupils: Pupils are equal, round, and reactive to light.  Neck:     Thyroid: No  thyroid mass, thyromegaly or thyroid tenderness.     Vascular: No carotid bruit or JVD.     Trachea: Trachea and  phonation normal.  Cardiovascular:     Rate and Rhythm: Regular rhythm.     Chest Wall: PMI is not displaced.     Pulses: Normal pulses.     Heart sounds: Murmur heard.     Systolic murmur is present with a grade of 2/6.     No friction rub. No gallop.  Pulmonary:     Effort: Pulmonary effort is normal. No respiratory distress.     Breath sounds: Normal breath sounds. No wheezing.  Abdominal:     General: Bowel sounds are normal. There is no distension or abdominal bruit.     Palpations: Abdomen is soft. There is no hepatomegaly or splenomegaly.     Tenderness: There is no abdominal tenderness. There is no right CVA tenderness or left CVA tenderness.     Hernia: No hernia is present.  Musculoskeletal:        General: Normal range of motion.     Cervical back: Normal range of motion and neck supple.     Right lower leg: 1+ Edema present.     Left lower leg: 1+ Edema present.  Lymphadenopathy:     Cervical: No cervical adenopathy.  Skin:    General: Skin is warm and dry.     Capillary Refill: Capillary refill takes less than 2 seconds.     Coloration: Skin is not cyanotic, jaundiced or pale.     Findings: No rash.  Neurological:     General: No focal deficit present.     Mental Status: She is alert and oriented to person, place, and time.     Sensory: Sensation is intact.     Motor: Motor function is intact.     Coordination: Coordination is intact.     Gait: Gait abnormal (slow, using cane).     Deep Tendon Reflexes: Reflexes are normal and symmetric.  Psychiatric:        Attention and Perception: Attention and perception normal.        Mood and Affect: Mood and affect normal.        Speech: Speech normal.        Behavior: Behavior normal. Behavior is cooperative.        Thought Content: Thought content normal.        Cognition and Memory: Cognition and memory normal.        Judgment: Judgment normal.     Results for orders placed or performed in visit on 10/05/22   Allergen Profile, Mold  Result Value Ref Range   Class Description Allergens Comment    Penicillium Chrysogen IgE <0.10 Class 0 kU/L   Cladosporium Herbarum IgE <0.10 Class 0 kU/L   Aspergillus Fumigatus IgE <0.10 Class 0 kU/L   Mucor Racemosus IgE <0.10 Class 0 kU/L   Candida Albicans IgE <0.10 Class 0 kU/L   Alternaria Alternata IgE <0.10 Class 0 kU/L   Setomelanomma Rostrat <0.10 Class 0 kU/L   M009-IgE Fusarium proliferatum <0.10 Class 0 kU/L   Aureobasidi Pullulans IgE <0.10 Class 0 kU/L   Phoma Betae IgE <0.10 Class 0 kU/L   M014-IgE Epicoccum purpur <0.10 Class 0 kU/L   Stemphylium Herbarum IgE <0.10 Class 0 kU/L       Pertinent labs & imaging results that were available during my care of the patient were reviewed by me and considered in my medical  decision making.  Assessment & Plan:  Treanna was seen today for leg swelling and back pain.  Diagnoses and all orders for this visit:  Right flank pain Urinalysis with few bacteria, negative nitrites. Will add culture and treat if warranted.  -     Urinalysis, Complete -     BMP8+EGFR -     Urine Culture  Localized swelling of both lower legs Likely due to high dose amlodipine and gabapentin use. Instructed on compression stocking use and limiting sodium intake. Will check below labs. Lasix as prescribed. Aware to take the next three days and then only as needed for edema. Pt has follow up with PCP scheduled for reevaluation.  -     BMP8+EGFR -     Brain natriuretic peptide -     furosemide (LASIX) 20 MG tablet; Take 1 tablet (20 mg total) by mouth daily.  Slow transit constipation Encouraged to increase water and fiber intake, will add below. Report new, worsening, or persistent symptoms.  -     polyethylene glycol powder (GLYCOLAX/MIRALAX) 17 GM/SCOOP powder; Take 17 g by mouth daily.     Continue all other maintenance medications.  Follow up plan: Return in about 4 weeks (around 01/02/2023), or if symptoms worsen or  fail to improve, for edema.   Continue healthy lifestyle choices, including diet (rich in fruits, vegetables, and lean proteins, and low in salt and simple carbohydrates) and exercise (at least 30 minutes of moderate physical activity daily).  Educational handout given for edema  The above assessment and management plan was discussed with the patient. The patient verbalized understanding of and has agreed to the management plan. Patient is aware to call the clinic if they develop any new symptoms or if symptoms persist or worsen. Patient is aware when to return to the clinic for a follow-up visit. Patient educated on when it is appropriate to go to the emergency department.   Monia Pouch, FNP-C [facility] [phone]

## 2022-12-05 NOTE — Therapy (Signed)
OUTPATIENT PHYSICAL THERAPY LOWER EXTREMITY TREATMENT   Patient Name: Danielle Parrish MRN: [id number] DOB:May 12, 1941, 81 y.o., female Today's Date: 12/05/2022   PT End of Session - 12/05/22 1452     Visit Number 9    Number of Visits 12    Date for PT Re-Evaluation 12/12/22    Authorization Type PROGRESS NOTE AT 10TH VISIT.  KX MODIFIER AFTER 15 VISITS.    PT Start Time 4854    PT Stop Time 1510    PT Time Calculation (min) 33 min    Activity Tolerance Patient tolerated treatment well    Behavior During Therapy WFL for tasks assessed/performed            Past Medical History:  Diagnosis Date   Anxiety    Arthritis    CHF (congestive heart failure) (HCC)    Chronic pain of multiple joints    Depression    Essential hypertension    GERD (gastroesophageal reflux disease)    Hyperlipidemia    Hyperlipidemia associated with type 2 diabetes mellitus ([place])    Hypertension associated with type 2 diabetes mellitus ([place])    Hypothyroidism    Mild scoliosis 10/19/2021   Osteoporosis    Sleep apnea    CPAP   Type 2 diabetes mellitus with hypercholesterolemia ([place])    Vitamin D deficiency    Past Surgical History:  Procedure Laterality Date   ABDOMINAL HERNIA REPAIR     ABDOMINAL HYSTERECTOMY  1977   BREAST EXCISIONAL BIOPSY Right    EYE SURGERY  2020   bilateral cataract removal    KNEE ARTHROSCOPY Left 09/30/2014   Procedure: LEFT KNEE ARTHROSCOPY MEDIAL MENISECTOMY ABRASION CHONDROPLASTY SYNOVECTOMY SUPRAPATELLER CUFF;  Surgeon: Tobi Bastos, MD;  Location: [facility];  Service: Orthopedics;  Laterality: Left;   Patient Active Problem List   Diagnosis Date Noted   Chronic respiratory failure with hypoxia ([place]) 07/10/2022   DOE (dyspnea on exertion) 07/04/2022   Breast pain, left 02/01/2022   Generalized abdominal pain 02/01/2022   Chronic bilateral back pain 10/28/2021   Chronic midline low back pain without sciatica 01/30/2019   Age-related osteoporosis with  current pathol fracture of vertebra ([place]) 10/28/2018   Calcification of aorta (HCC) 10/02/2018   Compression fracture of T12 vertebra ([place]) 10/01/2018   Primary osteoarthritis of right knee 07/20/2017   Varicose veins of left lower extremity with complications 62/70/3500   OSA (obstructive sleep apnea) 09/14/2015   Chronic diastolic heart failure ([place]) 09/10/2015   Orthopnea 09/02/2015   Dysphagia, pharyngoesophageal phase 08/04/2015   Anorexia 08/04/2015   Fatigue 08/04/2015   Malaise and fatigue 08/04/2015   Depression, recurrent ([place]) 04/08/2015   Vitamin D deficiency    Anxiety    GERD (gastroesophageal reflux disease)    Arthritis    Hypertension associated with type 2 diabetes mellitus ([place]) 05/15/2013   Hyperlipidemia associated with type 2 diabetes mellitus ([place]) 05/15/2013   Type 2 diabetes mellitus with hypercholesterolemia ([place]) 05/15/2013   Morbid obesity ([place]) 05/15/2013   Hypothyroidism 05/15/2013   REFERRING PROVIDER: Vonna Kotyk Dettinger MD  REFERRING DIAG: OA right knee, DDD lumbar.  THERAPY DIAG:  Chronic pain of right knee  Chronic pain of left knee  Chronic bilateral low back pain without sciatica  Rationale for Evaluation and Treatment Rehabilitation  ONSET DATE: Ongoing.  SUBJECTIVE:   SUBJECTIVE STATEMENT: Reports her breathing has not been that good today and has had swelling in her LE and ankles. To see PCP after PT treatment today.  PERTINENT HISTORY: OA, left knee surgery, HTN, DM, OP, DOE.  PAIN:  Are you having pain? Yes: NPRS scale: 7/10 Pain location: B lumbar Pain description: Discomfort Aggravating factors: Unknown Relieving factors: Unknown    PRECAUTIONS: Other: Monitor 02 and BP please.  PATIENT GOALS:  Increased activity level.  OBJECTIVE:                                     EXERCISE LOG 11/15/22:  Exercise Repetitions and Resistance Comments  Nustep  L3 x15 minutes   Ham curls 30# x 2 minutes   Knee ext 10# x 2 minutes.    Clam Green x20 reps   Marching seated Green x20 reps         ASSESSMENT:  CLINICAL IMPRESSION: Patient arrived in clinic reporting that she has had more breathing difficulty today but has been busy and went to [facility] prior to PT. Patient reporting an increase in LE swelling in the last two weeks but to PCP after today's treatment. Patient limited by fatigue during PT session with SOB. Vitals monitored during therex with 91%, 88 bpm before PT session began and O2 dropping to 85% after machine strengthening. With diaphragmatic breathing O2 increased to 93%.  OBJECTIVE IMPAIRMENTS Abnormal gait, decreased endurance, decreased ROM, decreased strength, increased muscle spasms, postural dysfunction, and pain.   ACTIVITY LIMITATIONS carrying, lifting, bending, and locomotion level  PARTICIPATION LIMITATIONS: meal prep, cleaning, and laundry  PERSONAL FACTORS Time since onset of injury/illness/exacerbation are also affecting patient's functional outcome.   REHAB POTENTIAL: Good  CLINICAL DECISION MAKING: Evolving/moderate complexity  EVALUATION COMPLEXITY: Moderate  GOALS:  SHORT TERM GOALS: Target date: 12/19/2022  Ind with initial HEP. Baseline: Goal status: INITIAL  LONG TERM GOALS: Target date: 11/06/2022   Ind with advanced HEP. Baseline:  Goal status: INITIAL  2.  Improve TUG by 6 seconds Baseline:  Goal status: INITIAL  3.  Improve 5 time sit to stand by 6 seconds. Baseline:  Goal status: INITIAL  4.  Perform ADL's with LB and knee pain not to exceed 4/10. Baseline:  Goal status: INITIAL  PLAN: PT FREQUENCY: 2x/week  PT DURATION: 6 weeks  PLANNED INTERVENTIONS: Therapeutic exercises, Therapeutic activity, Neuromuscular re-education, Balance training, Gait training, Patient/Family education, Self Care, Electrical stimulation, Cryotherapy, Moist heat, Ultrasound, and Manual therapy  PLAN FOR NEXT SESSION: Postural improvement exercises, core exercise  progression, Nustep.  Please monitor 02 sat and BP.  Standley Brooking, PTA 12/05/2022, 4:59 PM

## 2022-12-06 LAB — BRAIN NATRIURETIC PEPTIDE: BNP: 68.2 pg/mL (ref 0.0–100.0)

## 2022-12-06 LAB — BMP8+EGFR
BUN/Creatinine Ratio: 14 (ref 12–28)
BUN: 11 mg/dL (ref 8–27)
CO2: 27 mmol/L (ref 20–29)
Calcium: 10.7 mg/dL — ABNORMAL HIGH (ref 8.7–10.3)
Chloride: 103 mmol/L (ref 96–106)
Creatinine, Ser: 0.77 mg/dL (ref 0.57–1.00)
Glucose: 122 mg/dL — ABNORMAL HIGH (ref 70–99)
Potassium: 5.1 mmol/L (ref 3.5–5.2)
Sodium: 139 mmol/L (ref 134–144)
eGFR: 77 mL/min/{1.73_m2} (ref >59)

## 2022-12-08 ENCOUNTER — Ambulatory Visit: Payer: Medicare Other | Admitting: *Deleted

## 2022-12-08 LAB — URINE CULTURE

## 2022-12-08 MED ORDER — CIPROFLOXACIN HCL 500 MG PO TABS: 500.0000 mg | ORAL_TABLET | Freq: Two times a day (BID) | ORAL | 0 refills | Status: AC

## 2022-12-08 NOTE — Addendum Note (Signed)
Addended by: Baruch Gouty on: 12/08/2022 01:41 PM   Modules accepted: Orders

## 2022-12-12 ENCOUNTER — Ambulatory Visit: Payer: Medicare Other

## 2022-12-12 DIAGNOSIS — M545 Low back pain, unspecified: Secondary | ICD-10-CM | POA: Diagnosis not present

## 2022-12-12 DIAGNOSIS — M6281 Muscle weakness (generalized): Secondary | ICD-10-CM | POA: Diagnosis not present

## 2022-12-12 DIAGNOSIS — M25562 Pain in left knee: Secondary | ICD-10-CM | POA: Diagnosis not present

## 2022-12-12 DIAGNOSIS — R293 Abnormal posture: Secondary | ICD-10-CM | POA: Diagnosis not present

## 2022-12-12 DIAGNOSIS — M25561 Pain in right knee: Secondary | ICD-10-CM | POA: Diagnosis not present

## 2022-12-12 DIAGNOSIS — G8929 Other chronic pain: Secondary | ICD-10-CM

## 2022-12-12 NOTE — Therapy (Signed)
OUTPATIENT PHYSICAL THERAPY LOWER EXTREMITY TREATMENT   Patient Name: Danielle Parrish MRN: [id number] DOB:1941/04/06, 81 y.o., female Today's Date: 12/12/2022   PT End of Session - 12/12/22 1603     Visit Number 10    Number of Visits 12    Date for PT Re-Evaluation 12/12/22    Authorization Type PROGRESS NOTE AT 10TH VISIT.  KX MODIFIER AFTER 15 VISITS.    PT Start Time 1602    PT Stop Time 1645    PT Time Calculation (min) 43 min    Activity Tolerance Patient tolerated treatment well    Behavior During Therapy WFL for tasks assessed/performed             Past Medical History:  Diagnosis Date   Anxiety    Arthritis    CHF (congestive heart failure) (HCC)    Chronic pain of multiple joints    Depression    Essential hypertension    GERD (gastroesophageal reflux disease)    Hyperlipidemia    Hyperlipidemia associated with type 2 diabetes mellitus ([place])    Hypertension associated with type 2 diabetes mellitus ([place])    Hypothyroidism    Mild scoliosis 10/19/2021   Osteoporosis    Sleep apnea    CPAP   Type 2 diabetes mellitus with hypercholesterolemia ([place])    Vitamin D deficiency    Past Surgical History:  Procedure Laterality Date   ABDOMINAL HERNIA REPAIR     ABDOMINAL HYSTERECTOMY  1977   BREAST EXCISIONAL BIOPSY Right    EYE SURGERY  2020   bilateral cataract removal    KNEE ARTHROSCOPY Left 09/30/2014   Procedure: LEFT KNEE ARTHROSCOPY MEDIAL MENISECTOMY ABRASION CHONDROPLASTY SYNOVECTOMY SUPRAPATELLER CUFF;  Surgeon: Tobi Bastos, MD;  Location: [facility];  Service: Orthopedics;  Laterality: Left;   Patient Active Problem List   Diagnosis Date Noted   Chronic respiratory failure with hypoxia ([place]) 07/10/2022   DOE (dyspnea on exertion) 07/04/2022   Breast pain, left 02/01/2022   Generalized abdominal pain 02/01/2022   Chronic bilateral back pain 10/28/2021   Chronic midline low back pain without sciatica 01/30/2019   Age-related osteoporosis with  current pathol fracture of vertebra ([place]) 10/28/2018   Calcification of aorta (HCC) 10/02/2018   Compression fracture of T12 vertebra ([place]) 10/01/2018   Primary osteoarthritis of right knee 07/20/2017   Varicose veins of left lower extremity with complications 84/69/6295   OSA (obstructive sleep apnea) 09/14/2015   Chronic diastolic heart failure ([place]) 09/10/2015   Orthopnea 09/02/2015   Dysphagia, pharyngoesophageal phase 08/04/2015   Anorexia 08/04/2015   Fatigue 08/04/2015   Malaise and fatigue 08/04/2015   Depression, recurrent ([place]) 04/08/2015   Vitamin D deficiency    Anxiety    GERD (gastroesophageal reflux disease)    Arthritis    Hypertension associated with type 2 diabetes mellitus ([place]) 05/15/2013   Hyperlipidemia associated with type 2 diabetes mellitus ([place]) 05/15/2013   Type 2 diabetes mellitus with hypercholesterolemia ([place]) 05/15/2013   Morbid obesity ([place]) 05/15/2013   Hypothyroidism 05/15/2013   REFERRING PROVIDER: Vonna Kotyk Dettinger MD  REFERRING DIAG: OA right knee, DDD lumbar.  THERAPY DIAG:  Chronic pain of right knee  Chronic pain of left knee  Chronic bilateral low back pain without sciatica  Rationale for Evaluation and Treatment Rehabilitation  ONSET DATE: Ongoing.  SUBJECTIVE:   SUBJECTIVE STATEMENT: Patient reports that she is tired today.   PERTINENT HISTORY: OA, left knee surgery, HTN, DM, OP, DOE.  PAIN:  Are you having  pain? Yes: NPRS scale: 4/10 Pain location: B lumbar Pain description: Discomfort Aggravating factors: Unknown Relieving factors: Unknown    PRECAUTIONS: Other: Monitor 02 and BP please.  PATIENT GOALS:  Increased activity level.  OBJECTIVE:                                    12/19 EXERCISE LOG  Exercise Repetitions and Resistance Comments  Nustep  L3 x 15 minutes   Cybex knee flexion  30# x 3 minutes    Cybex knee extension 10# x 2 minutes   Seated heel /toe raises 3 minutes        Blank cell = exercise  not performed today   Patient education: progress with therapy, heart rate, oxygen saturation, and plan of care                                    EXERCISE LOG 11/15/22:  Exercise Repetitions and Resistance Comments  Nustep  L3 x15 minutes   Ham curls 30# x 2 minutes   Knee ext 10# x 2 minutes.   Clam Green x20 reps   Marching seated Green x20 reps         ASSESSMENT:  CLINICAL IMPRESSION: Patient has made no significant progress with skilled physical therapy as evidenced by her subjective reports, objective measures, functional mobility, and progress toward her goals. She has yet to meet any of goals for therapy at this time. However, this could partially be limited by her shortness of breath and fatigue. Treatment focused on familiar interventions for improved lower extremity strength. Her SpO2 remained within 84-95% with activity. She reported feeling tired upon the conclusion of treatment. Recommend that she continue with her current plan of care to address her remaining impairments to maximize her functional mobility.   OBJECTIVE IMPAIRMENTS Abnormal gait, decreased endurance, decreased ROM, decreased strength, increased muscle spasms, postural dysfunction, and pain.   ACTIVITY LIMITATIONS carrying, lifting, bending, and locomotion level  PARTICIPATION LIMITATIONS: meal prep, cleaning, and laundry  PERSONAL FACTORS Time since onset of injury/illness/exacerbation are also affecting patient's functional outcome.   REHAB POTENTIAL: Good  CLINICAL DECISION MAKING: Evolving/moderate complexity  EVALUATION COMPLEXITY: Moderate  GOALS:  SHORT TERM GOALS: Target date: 12/26/2022  Ind with initial HEP. Baseline: patient reports that she has been doing her HEP about twice per day Goal status: IN PROGRESS  LONG TERM GOALS: Target date: 11/06/2022   Ind with advanced HEP. Baseline:  Goal status: IN PROGRESS  2.  Improve TUG by 6 seconds Baseline: 26 seconds at initial  evaluation; 28.55 seconds on 12/12/22 Goal status: IN PROGRESS  3.  Improve 5 time sit to stand by 6 seconds. Baseline: 22 seconds at initial evaluation; 32.84 seconds with UE support from armrests on 12/12/22 Goal status: IN PROGRESS  4.  Perform ADL's with LB and knee pain not to exceed 4/10. Baseline: Patient reports that her pain gets more than a 4/10. Goal status: IN PROGRESS  PLAN: PT FREQUENCY: 2x/week  PT DURATION: 6 weeks  PLANNED INTERVENTIONS: Therapeutic exercises, Therapeutic activity, Neuromuscular re-education, Balance training, Gait training, Patient/Family education, Self Care, Electrical stimulation, Cryotherapy, Moist heat, Ultrasound, and Manual therapy  PLAN FOR NEXT SESSION: Postural improvement exercises, core exercise progression, Nustep.  Please monitor 02 sat and BP.  Darlin Coco, PT 12/12/2022, 5:18 PM

## 2022-12-17 DIAGNOSIS — R0902 Hypoxemia: Secondary | ICD-10-CM | POA: Diagnosis not present

## 2022-12-21 ENCOUNTER — Ambulatory Visit: Payer: Medicare Other | Admitting: Physical Therapy

## 2022-12-27 ENCOUNTER — Ambulatory Visit (INDEPENDENT_AMBULATORY_CARE_PROVIDER_SITE_OTHER): Payer: Medicare Other | Admitting: Family Medicine

## 2022-12-27 ENCOUNTER — Encounter: Payer: Self-pay | Admitting: Family Medicine

## 2022-12-27 ENCOUNTER — Ambulatory Visit: Payer: 59 | Attending: Family Medicine

## 2022-12-27 VITALS — BP 161/67 | HR 76 | Temp 98.1°F | Ht 61.0 in | Wt 177.0 lb

## 2022-12-27 DIAGNOSIS — E1159 Type 2 diabetes mellitus with other circulatory complications: Secondary | ICD-10-CM

## 2022-12-27 DIAGNOSIS — Z23 Encounter for immunization: Secondary | ICD-10-CM | POA: Diagnosis not present

## 2022-12-27 DIAGNOSIS — M25562 Pain in left knee: Secondary | ICD-10-CM | POA: Diagnosis not present

## 2022-12-27 DIAGNOSIS — E1169 Type 2 diabetes mellitus with other specified complication: Secondary | ICD-10-CM | POA: Diagnosis not present

## 2022-12-27 DIAGNOSIS — I152 Hypertension secondary to endocrine disorders: Secondary | ICD-10-CM | POA: Diagnosis not present

## 2022-12-27 DIAGNOSIS — M25561 Pain in right knee: Secondary | ICD-10-CM | POA: Diagnosis not present

## 2022-12-27 DIAGNOSIS — E039 Hypothyroidism, unspecified: Secondary | ICD-10-CM

## 2022-12-27 DIAGNOSIS — M545 Low back pain, unspecified: Secondary | ICD-10-CM | POA: Diagnosis not present

## 2022-12-27 DIAGNOSIS — I5032 Chronic diastolic (congestive) heart failure: Secondary | ICD-10-CM | POA: Diagnosis not present

## 2022-12-27 DIAGNOSIS — E785 Hyperlipidemia, unspecified: Secondary | ICD-10-CM

## 2022-12-27 DIAGNOSIS — E7800 Pure hypercholesterolemia, unspecified: Secondary | ICD-10-CM | POA: Diagnosis not present

## 2022-12-27 DIAGNOSIS — R399 Unspecified symptoms and signs involving the genitourinary system: Secondary | ICD-10-CM

## 2022-12-27 DIAGNOSIS — G8929 Other chronic pain: Secondary | ICD-10-CM | POA: Diagnosis not present

## 2022-12-27 LAB — URINALYSIS
Bilirubin, UA: NEGATIVE
Glucose, UA: NEGATIVE
Ketones, UA: NEGATIVE
Nitrite, UA: NEGATIVE
Protein,UA: NEGATIVE
RBC, UA: NEGATIVE
Specific Gravity, UA: 1.01 (ref 1.005–1.030)
Urobilinogen, Ur: 0.2 mg/dL (ref 0.2–1.0)
pH, UA: 5.5 (ref 5.0–7.5)

## 2022-12-27 LAB — BAYER DCA HB A1C WAIVED: HB A1C (BAYER DCA - WAIVED): 5.8 % — ABNORMAL HIGH (ref 4.8–5.6)

## 2022-12-27 NOTE — Therapy (Signed)
OUTPATIENT PHYSICAL THERAPY LOWER EXTREMITY TREATMENT   Patient Name: Danielle Parrish MRN: [id number] DOB:[birth year], 82 y.o., female Today's Date: 12/27/2022   PT End of Session - 12/27/22 1303     Visit Number 11    Number of Visits 12    Date for PT Re-Evaluation 12/29/22    Authorization Type PROGRESS NOTE AT 10TH VISIT.  KX MODIFIER AFTER 15 VISITS.    PT Start Time 1300    PT Stop Time 5993    PT Time Calculation (min) 45 min    Activity Tolerance Patient tolerated treatment well    Behavior During Therapy WFL for tasks assessed/performed             Past Medical History:  Diagnosis Date   Anxiety    Arthritis    CHF (congestive heart failure) (HCC)    Chronic pain of multiple joints    Depression    Essential hypertension    GERD (gastroesophageal reflux disease)    Hyperlipidemia    Hyperlipidemia associated with type 2 diabetes mellitus ([place])    Hypertension associated with type 2 diabetes mellitus ([place])    Hypothyroidism    Mild scoliosis 10/19/2021   Osteoporosis    Sleep apnea    CPAP   Type 2 diabetes mellitus with hypercholesterolemia ([place])    Vitamin D deficiency    Past Surgical History:  Procedure Laterality Date   ABDOMINAL HERNIA REPAIR     ABDOMINAL HYSTERECTOMY  1977   BREAST EXCISIONAL BIOPSY Right    EYE SURGERY  2020   bilateral cataract removal    KNEE ARTHROSCOPY Left 09/30/2014   Procedure: LEFT KNEE ARTHROSCOPY MEDIAL MENISECTOMY ABRASION CHONDROPLASTY SYNOVECTOMY SUPRAPATELLER CUFF;  Surgeon: Tobi Bastos, MD;  Location: [facility];  Service: Orthopedics;  Laterality: Left;   Patient Active Problem List   Diagnosis Date Noted   Chronic respiratory failure with hypoxia ([place]) 07/10/2022   DOE (dyspnea on exertion) 07/04/2022   Breast pain, left 02/01/2022   Generalized abdominal pain 02/01/2022   Chronic bilateral back pain 10/28/2021   Chronic midline low back pain without sciatica 01/30/2019   Age-related osteoporosis with  current pathol fracture of vertebra ([place]) 10/28/2018   Calcification of aorta (HCC) 10/02/2018   Compression fracture of T12 vertebra ([place]) 10/01/2018   Primary osteoarthritis of right knee 07/20/2017   Varicose veins of left lower extremity with complications 57/12/7791   OSA (obstructive sleep apnea) 09/14/2015   Chronic diastolic heart failure ([place]) 09/10/2015   Orthopnea 09/02/2015   Dysphagia, pharyngoesophageal phase 08/04/2015   Anorexia 08/04/2015   Fatigue 08/04/2015   Malaise and fatigue 08/04/2015   Depression, recurrent ([place]) 04/08/2015   Vitamin D deficiency    Anxiety    GERD (gastroesophageal reflux disease)    Arthritis    Hypertension associated with type 2 diabetes mellitus ([place]) 05/15/2013   Hyperlipidemia associated with type 2 diabetes mellitus ([place]) 05/15/2013   Type 2 diabetes mellitus with hypercholesterolemia ([place]) 05/15/2013   Morbid obesity ([place]) 05/15/2013   Hypothyroidism 05/15/2013   REFERRING PROVIDER: Vonna Kotyk Dettinger MD  REFERRING DIAG: OA right knee, DDD lumbar.  THERAPY DIAG:  Chronic pain of right knee  Chronic pain of left knee  Rationale for Evaluation and Treatment Rehabilitation  ONSET DATE: Ongoing.  SUBJECTIVE:   SUBJECTIVE STATEMENT: Patient reports that she feels SHOB today, SpO2 90% on RA ambulating into facility.   PERTINENT HISTORY: OA, left knee surgery, HTN, DM, OP, DOE.  PAIN:  Are you having pain?  Yes: NPRS scale: 0/10 Pain location: B lumbar Pain description: Discomfort Aggravating factors: Unknown Relieving factors: Unknown    PRECAUTIONS: Other: Monitor 02 and BP please.  PATIENT GOALS:  Increased activity level.  OBJECTIVE:                                    1/3 EXERCISE LOG  Exercise Repetitions and Resistance Comments  Nustep  L3 x 15 minutes   Cybex knee flexion  30# x 3 minutes    Cybex knee extension 10# x 3 minutes   Seated heel /toe raises 30 reps   Seated Marches 3# x 2.5 mins   Ball  Squeezes X3 mins   Seated Hip Abduction Green tband x3 mins   STS X10 reps    Blank cell = exercise not performed today                                     EXERCISE LOG 11/15/22:  Exercise Repetitions and Resistance Comments  Nustep  L3 x15 minutes   Ham curls 30# x 2 minutes   Knee ext 10# x 2 minutes.   Clam Green x20 reps   Marching seated Green x20 reps         ASSESSMENT:  CLINICAL IMPRESSION: Pt arrives for today's treatment session denying any pain, but does report SHOB.  Pt's SpO2 fluctuates better 84% and 93% during today's treatment session.  Pt given rest breaks as needed due to fatigue and SHOB.  Pt reports having a MD appointment after today's session concerning her breathing.  Pt mentioned asking MD about portable supplemental oxygen.  OBJECTIVE IMPAIRMENTS Abnormal gait, decreased endurance, decreased ROM, decreased strength, increased muscle spasms, postural dysfunction, and pain.   ACTIVITY LIMITATIONS carrying, lifting, bending, and locomotion level  PARTICIPATION LIMITATIONS: meal prep, cleaning, and laundry  PERSONAL FACTORS Time since onset of injury/illness/exacerbation are also affecting patient's functional outcome.   REHAB POTENTIAL: Good  CLINICAL DECISION MAKING: Evolving/moderate complexity  EVALUATION COMPLEXITY: Moderate  GOALS:  SHORT TERM GOALS: Target date: 12/26/2022  Ind with initial HEP. Baseline: patient reports that she has been doing her HEP about twice per day Goal status: IN PROGRESS  LONG TERM GOALS: Target date: 11/06/2022   Ind with advanced HEP. Baseline:  Goal status: IN PROGRESS  2.  Improve TUG by 6 seconds Baseline: 26 seconds at initial evaluation; 28.55 seconds on 12/12/22 Goal status: IN PROGRESS  3.  Improve 5 time sit to stand by 6 seconds. Baseline: 22 seconds at initial evaluation; 32.84 seconds with UE support from armrests on 12/12/22 Goal status: IN PROGRESS  4.  Perform ADL's with LB and knee pain not to  exceed 4/10. Baseline: Patient reports that her pain gets more than a 4/10. Goal status: IN PROGRESS  PLAN: PT FREQUENCY: 2x/week  PT DURATION: 6 weeks  PLANNED INTERVENTIONS: Therapeutic exercises, Therapeutic activity, Neuromuscular re-education, Balance training, Gait training, Patient/Family education, Self Care, Electrical stimulation, Cryotherapy, Moist heat, Ultrasound, and Manual therapy  PLAN FOR NEXT SESSION: Postural improvement exercises, core exercise progression, Nustep.  Please monitor 02 sat and BP.  Kathrynn Ducking, PTA 12/27/2022, 1:58 PM

## 2022-12-27 NOTE — Progress Notes (Signed)
BP (!) 161/67   Pulse 76   Temp 98.1 F (36.7 C)   Ht _0  (1.549 m)   Wt 177 lb (80.3 kg)   SpO2 99%   BMI 33.44 kg/m    Subjective:   Patient ID: Danielle Parrish, female    DOB: 24-Apr-1941, 82 y.o.   MRN: [id number]  HPI: Danielle Parrish is a 82 y.o. female presenting on 12/27/2022 for Medical Management of Chronic Issues, Hypothyroidism, Edema (BLE), and Breast Pain (Left sided)   HPI Hypothyroidism recheck Patient is coming in for thyroid recheck today as well. They deny any issues with hair changes or heat or cold problems or diarrhea or constipation. They deny any chest pain or palpitations. They are currently on levothyroxine 100 micrograms   Hypertension and CHF recheck Patient is currently on amlodipine and furosemide and metoprolol and olmesartan, and their blood pressure today is 152/77. Patient denies any lightheadedness or dizziness. Patient denies headaches, blurred vision, chest pains, shortness of breath, or weakness. Denies any side effects from medication and is content with current medication.   Hyperlipidemia Patient is coming in for recheck of his hyperlipidemia. The patient is currently taking lovastatin. They deny any issues with myalgias or history of liver damage from it. They deny any focal numbness or weakness or chest pain.   Type 2 diabetes mellitus Patient comes in today for recheck of his diabetes. Patient has been currently taking no medication currently, has been diet controlled. Patient is currently on an ACE inhibitor/ARB. Patient has not seen an ophthalmologist this year. Patient denies any issues with their feet. The symptom started onset as an adult hypertension and hyperlipidemia and hypothyroidism ARE RELATED TO DM   Patient gets the occasional shooting pain on the left side of her chest and into her left breast that has been going on for the past month or 2.  She denies any rash or skin changes or lumps but just has that burning shooting  type pain.  She does have a mammogram coming up soon and she will go ahead and get that scheduled.  Relevant past medical, surgical, family and social history reviewed and updated as indicated. Interim medical history since our last visit reviewed. Allergies and medications reviewed and updated.  Review of Systems  Constitutional:  Negative for chills and fever.  Eyes:  Negative for redness and visual disturbance.  Respiratory:  Negative for chest tightness and shortness of breath.   Cardiovascular:  Negative for chest pain and leg swelling.  Musculoskeletal:  Negative for back pain and gait problem.  Skin:  Negative for rash.  Neurological:  Negative for dizziness, light-headedness and headaches.  Psychiatric/Behavioral:  Negative for agitation and behavioral problems.   All other systems reviewed and are negative.   Per HPI unless specifically indicated above   Allergies as of 12/27/2022       Reactions   Nsaids    Burns stomach.    Penicillins Hives, Other (See Comments)   Has patient had a PCN reaction causing immediate rash, facial/tongue/throat swelling, SOB or lightheadedness with hypotension: Yes Has patient had a PCN reaction causing severe rash involving mucus membranes or skin necrosis: unknown Has patient had a PCN reaction that required hospitalization No Has patient had a PCN reaction occurring within the last 10 years: No If all of the above answers are "NO", then may proceed with Cephalosporin use.   Rosuvastatin Other (See Comments)   Body aches.   Statins Other (See Comments)  Vytorin [ezetimibe-simvastatin]    Body aches.    Aspirin Nausea Only   Tolmetin Nausea Only   Burns stomach.         Medication List        Accurate as of December 27, 2022  3:34 PM. If you have any questions, ask your nurse or doctor.          STOP taking these medications    erythromycin ophthalmic ointment Stopped by: Fransisca Kaufmann Egon Dittus, MD       TAKE these  medications    acetaminophen 500 MG tablet Commonly known as: TYLENOL Take 1 tablet (500 mg total) by mouth every 6 (six) hours as needed.   albuterol 108 (90 Base) MCG/ACT inhaler Commonly known as: VENTOLIN HFA Take 2 puffs eveery 6 hours as needed for wheezing or shortness of breath   amLODipine 10 MG tablet Commonly known as: NORVASC Take 1 tablet (10 mg total) by mouth daily.   azelastine 0.1 % nasal spray Commonly known as: ASTELIN Place 1 spray into both nostrils 2 (two) times daily. Use in each nostril as directed   calcium-vitamin D 500-200 MG-UNIT Tabs tablet Commonly known as: OSCAL WITH D TAKE 1 TABLET BY MOUTH EVERY DAY WITH BREAKFAST   cetirizine 10 MG tablet Commonly known as: ZYRTEC Take 1 tablet (10 mg total) by mouth daily.   cyclobenzaprine 5 MG tablet Commonly known as: FLEXERIL Take 1 tablet (5 mg total) by mouth 3 (three) times daily as needed for muscle spasms.   diclofenac Sodium 1 % Gel Commonly known as: VOLTAREN APPLY 4 GRAMS TOPICALLY 4 (FOUR) TIMES DAILY.   diphenhydrAMINE 25 MG tablet Commonly known as: BENADRYL Take 50 mg by mouth at bedtime as needed for allergies.   furosemide 20 MG tablet Commonly known as: LASIX Take 1 tablet (20 mg total) by mouth daily.   gabapentin 300 MG capsule Commonly known as: NEURONTIN TAKE 1 CAPSULE BY MOUTH 3 TIMES DAILY   Iron 325 (65 Fe) MG Tabs Take 1 tablet by mouth 3 (three) times daily.   ketoconazole 2 % cream Commonly known as: NIZORAL APPLY TO AFFECTED AREA EVERYDAY   levothyroxine 100 MCG tablet Commonly known as: SYNTHROID TAKE 1 TABLET BY MOUTH EVERY DAY BEFORE BREAKFAST   lidocaine 5 % Commonly known as: LIDODERM PLACE 1 PATCH ONTO THE SKIN DAILY. REMOVE & DISCARD PATCH WITHIN 12 HOURS OR AS DIRECTED BY MD   lovastatin 40 MG tablet Commonly known as: MEVACOR TAKE 1 TABLET BY MOUTH EVERYDAY AT BEDTIME   metoprolol succinate 25 MG 24 hr tablet Commonly known as: TOPROL-XL Take  1 tablet (25 mg total) by mouth daily.   mirabegron ER 25 MG Tb24 tablet Commonly known as: Myrbetriq Take 1 tablet (25 mg total) by mouth daily.   Multi-Vitamin tablet Take by mouth.   nystatin powder Commonly known as: MYCOSTATIN/NYSTOP APPLY TO AFFECTED AREA 4 TIMES A DAY   nystatin cream Commonly known as: MYCOSTATIN APPLY TO AFFECTED AREA TWICE A DAY   olmesartan 20 MG tablet Commonly known as: BENICAR TAKE 1 TABLET BY MOUTH EVERY DAY   omeprazole 20 MG capsule Commonly known as: PRILOSEC Take 1 capsule (20 mg total) by mouth 2 (two) times daily before a meal.   polyethylene glycol powder 17 GM/SCOOP powder Commonly known as: GLYCOLAX/MIRALAX Take 17 g by mouth daily.   PRESERVISION AREDS 2 PO Take by mouth.   pyridOXINE 100 MG tablet Commonly known as: VITAMIN B6 Take 100 mg by  mouth daily.   sertraline 100 MG tablet Commonly known as: ZOLOFT TAKE 2 TABLETS BY MOUTH EVERY DAY   sodium chloride 0.65 % Soln nasal spray Commonly known as: OCEAN Place 1 spray into both nostrils as needed for congestion.   VITAMIN D-3 PO Take 1 capsule by mouth daily.         Objective:   BP (!) 161/67   Pulse 76   Temp 98.1 F (36.7 C)   Ht _0  (1.549 m)   Wt 177 lb (80.3 kg)   SpO2 99%   BMI 33.44 kg/m   Wt Readings from Last 3 Encounters:  12/27/22 177 lb (80.3 kg)  12/05/22 181 lb 6.4 oz (82.3 kg)  11/14/22 179 lb 3.2 oz (81.3 kg)    Physical Exam Vitals and nursing note reviewed.  Constitutional:      General: She is not in acute distress.    Appearance: She is well-developed. She is not diaphoretic.  Eyes:     Conjunctiva/sclera: Conjunctivae normal.  Cardiovascular:     Rate and Rhythm: Normal rate and regular rhythm.     Heart sounds: Normal heart sounds. No murmur heard. Pulmonary:     Effort: Pulmonary effort is normal. No respiratory distress.     Breath sounds: Normal breath sounds. No wheezing, rhonchi or rales.  Musculoskeletal:         General: No swelling or tenderness. Normal range of motion.  Skin:    General: Skin is warm and dry.     Findings: No rash.  Neurological:     Mental Status: She is alert and oriented to person, place, and time.     Coordination: Coordination normal.  Psychiatric:        Behavior: Behavior normal.     Results for orders placed or performed in visit on 12/05/22  Urine Culture   Specimen: Urine   UR  Result Value Ref Range   Urine Culture, Routine Final report (A)    Organism ID, Bacteria Comment (A)    Antimicrobial Susceptibility Comment   Microscopic Examination   Urine  Result Value Ref Range   WBC, UA 0-5 0 - 5 /hpf   RBC, Urine None seen 0 - 2 /hpf   Epithelial Cells (non renal) 0-10 0 - 10 /hpf   Renal Epithel, UA None seen None seen /hpf   Bacteria, UA Few (A) None seen/Few  Urinalysis, Complete  Result Value Ref Range   Specific Gravity, UA 1.010 1.005 - 1.030   pH, UA 6.0 5.0 - 7.5   Color, UA Yellow Yellow   Appearance Ur Clear Clear   Leukocytes,UA 1+ (A) Negative   Protein,UA Negative Negative/Trace   Glucose, UA Negative Negative   Ketones, UA Negative Negative   RBC, UA 1+ (A) Negative   Bilirubin, UA Negative Negative   Urobilinogen, Ur 0.2 0.2 - 1.0 mg/dL   Nitrite, UA Negative Negative   Microscopic Examination See below:   BMP8+EGFR  Result Value Ref Range   Glucose 122 (H) 70 - 99 mg/dL   BUN 11 8 - 27 mg/dL   Creatinine, Ser 0.77 0.57 - 1.00 mg/dL   eGFR 77 >59 mL/min/1.73   BUN/Creatinine Ratio 14 12 - 28   Sodium 139 134 - 144 mmol/L   Potassium 5.1 3.5 - 5.2 mmol/L   Chloride 103 96 - 106 mmol/L   CO2 27 20 - 29 mmol/L   Calcium 10.7 (H) 8.7 - 10.3 mg/dL  Brain natriuretic peptide  Result Value Ref Range   BNP 68.2 0.0 - 100.0 pg/mL    Assessment & Plan:   Problem List Items Addressed This Visit       Cardiovascular and Mediastinum   Hypertension associated with type 2 diabetes mellitus (HCC)   Chronic diastolic heart failure  (HCC)     Endocrine   Hyperlipidemia associated with type 2 diabetes mellitus ([place])   Type 2 diabetes mellitus with hypercholesterolemia ([place]) - Primary   Relevant Orders   Microalbumin / creatinine urine ratio   Bayer DCA Hb A1c Waived   TSH   Hypothyroidism   Relevant Orders   TSH   Other Visit Diagnoses     UTI symptoms       Relevant Orders   Urine Culture   Urinalysis     Urinalysis shows 2+ leukocytes but otherwise clean.  Await culture.   Will have patient do blood work on the way out.  Follow up plan: Return in about 3 months (around 03/28/2023), or if symptoms worsen or fail to improve, for Diabetes and hypertension.  Counseling provided for all of the vaccine components Orders Placed This Encounter  Procedures   Urine Culture   Urinalysis   Microalbumin / creatinine urine ratio   Bayer DCA Hb A1c Waived   TSH    Caryl Pina, MD [facility] 12/27/2022, 3:35 PM

## 2022-12-28 LAB — MICROALBUMIN / CREATININE URINE RATIO
Creatinine, Urine: 21.1 mg/dL
Microalb/Creat Ratio: 66 mg/g creat — ABNORMAL HIGH (ref 0–29)
Microalbumin, Urine: 13.9 ug/mL

## 2022-12-28 LAB — TSH: TSH: 0.606 u[IU]/mL (ref 0.450–4.500)

## 2022-12-29 LAB — URINE CULTURE

## 2023-01-01 ENCOUNTER — Other Ambulatory Visit: Payer: Self-pay | Admitting: Family Medicine

## 2023-01-01 ENCOUNTER — Ambulatory Visit: Payer: 59

## 2023-01-01 MED ORDER — NITROFURANTOIN MONOHYD MACRO 100 MG PO CAPS: 100.0000 mg | ORAL_CAPSULE | Freq: Two times a day (BID) | ORAL | 0 refills | Status: DC

## 2023-01-03 ENCOUNTER — Telehealth: Payer: Self-pay | Admitting: Family Medicine

## 2023-01-03 ENCOUNTER — Other Ambulatory Visit: Payer: Self-pay | Admitting: Family Medicine

## 2023-01-03 DIAGNOSIS — G8929 Other chronic pain: Secondary | ICD-10-CM

## 2023-01-03 DIAGNOSIS — R2243 Localized swelling, mass and lump, lower limb, bilateral: Secondary | ICD-10-CM

## 2023-01-03 DIAGNOSIS — B372 Candidiasis of skin and nail: Secondary | ICD-10-CM

## 2023-01-03 NOTE — Telephone Encounter (Signed)
Unfortunately the bacteria that grew is resistant to many other antibiotics so they will not work, a short course of this should be fine for her, have her go ahead and take that short course because the bacteria that grew was resistant to so many others that we do not have a lot of other options.

## 2023-01-03 NOTE — Telephone Encounter (Signed)
Pt has been informed and understood. She will call back if she has any concerns.

## 2023-01-03 NOTE — Telephone Encounter (Signed)
Number busy

## 2023-01-03 NOTE — Telephone Encounter (Signed)
Patient reports her pulmonologist told her she should not take the Macrobid that was prescribed because it will elevate her blood pressure.  I explained to patient that she is only to take it for 7 days but she is worried and does not want to take this.  Is asking if there is another antibiotic you can prescribe that will not elevate BP.

## 2023-01-05 ENCOUNTER — Ambulatory Visit: Payer: 59

## 2023-01-05 DIAGNOSIS — M25561 Pain in right knee: Secondary | ICD-10-CM | POA: Diagnosis not present

## 2023-01-05 DIAGNOSIS — G8929 Other chronic pain: Secondary | ICD-10-CM

## 2023-01-05 DIAGNOSIS — M545 Low back pain, unspecified: Secondary | ICD-10-CM | POA: Diagnosis not present

## 2023-01-05 DIAGNOSIS — M25562 Pain in left knee: Secondary | ICD-10-CM | POA: Diagnosis not present

## 2023-01-05 NOTE — Therapy (Signed)
OUTPATIENT PHYSICAL THERAPY LOWER EXTREMITY TREATMENT   Patient Name: Danielle Parrish MRN: [id number] DOB:May 26, 1941, 82 y.o., female Today's Date: 01/05/2023   PT End of Session - 01/05/23 1111     Visit Number 12    Number of Visits 12    Date for PT Re-Evaluation 01/05/23    Authorization Type PROGRESS NOTE AT 10TH VISIT.  KX MODIFIER AFTER 15 VISITS.    PT Start Time 1115    PT Stop Time 1155    PT Time Calculation (min) 40 min    Activity Tolerance Patient limited by fatigue    Behavior During Therapy WFL for tasks assessed/performed             Past Medical History:  Diagnosis Date   Anxiety    Arthritis    CHF (congestive heart failure) (HCC)    Chronic pain of multiple joints    Depression    Essential hypertension    GERD (gastroesophageal reflux disease)    Hyperlipidemia    Hyperlipidemia associated with type 2 diabetes mellitus ([place])    Hypertension associated with type 2 diabetes mellitus ([place])    Hypothyroidism    Mild scoliosis 10/19/2021   Osteoporosis    Sleep apnea    CPAP   Type 2 diabetes mellitus with hypercholesterolemia ([place])    Vitamin D deficiency    Past Surgical History:  Procedure Laterality Date   ABDOMINAL HERNIA REPAIR     ABDOMINAL HYSTERECTOMY  1977   BREAST EXCISIONAL BIOPSY Right    EYE SURGERY  2020   bilateral cataract removal    KNEE ARTHROSCOPY Left 09/30/2014   Procedure: LEFT KNEE ARTHROSCOPY MEDIAL MENISECTOMY ABRASION CHONDROPLASTY SYNOVECTOMY SUPRAPATELLER CUFF;  Surgeon: Tobi Bastos, MD;  Location: [facility];  Service: Orthopedics;  Laterality: Left;   Patient Active Problem List   Diagnosis Date Noted   Chronic respiratory failure with hypoxia ([place]) 07/10/2022   DOE (dyspnea on exertion) 07/04/2022   Breast pain, left 02/01/2022   Generalized abdominal pain 02/01/2022   Chronic bilateral back pain 10/28/2021   Chronic midline low back pain without sciatica 01/30/2019   Age-related osteoporosis with  current pathol fracture of vertebra ([place]) 10/28/2018   Calcification of aorta (HCC) 10/02/2018   Compression fracture of T12 vertebra ([place]) 10/01/2018   Primary osteoarthritis of right knee 07/20/2017   Varicose veins of left lower extremity with complications 06/19/9484   OSA (obstructive sleep apnea) 09/14/2015   Chronic diastolic heart failure ([place]) 09/10/2015   Orthopnea 09/02/2015   Dysphagia, pharyngoesophageal phase 08/04/2015   Anorexia 08/04/2015   Fatigue 08/04/2015   Malaise and fatigue 08/04/2015   Depression, recurrent ([place]) 04/08/2015   Vitamin D deficiency    Anxiety    GERD (gastroesophageal reflux disease)    Arthritis    Hypertension associated with type 2 diabetes mellitus ([place]) 05/15/2013   Hyperlipidemia associated with type 2 diabetes mellitus ([place]) 05/15/2013   Type 2 diabetes mellitus with hypercholesterolemia ([place]) 05/15/2013   Morbid obesity ([place]) 05/15/2013   Hypothyroidism 05/15/2013   REFERRING PROVIDER: Vonna Kotyk Dettinger MD  REFERRING DIAG: OA right knee, DDD lumbar.  THERAPY DIAG:  Chronic pain of right knee  Chronic pain of left knee  Chronic bilateral low back pain without sciatica  Rationale for Evaluation and Treatment Rehabilitation  ONSET DATE: Ongoing.  SUBJECTIVE:   SUBJECTIVE STATEMENT: Patient reports that she feels about the same compared to when she started therapy.   PERTINENT HISTORY: OA, left knee surgery, HTN, DM, OP,  DOE.  PAIN:  Are you having pain? Yes: NPRS scale: 0/10 Pain location: B lumbar Pain description: Discomfort Aggravating factors: Unknown Relieving factors: Unknown    PRECAUTIONS: Other: Monitor 02 and BP please.  PATIENT GOALS:  Increased activity level.  OBJECTIVE:                                    1/12 EXERCISE LOG  Exercise Repetitions and Resistance Comments  Nustep  L3 x 15 minutes   LAQ 2 minutes   Seated hip ADD isometric  2 minutes w/ 5 second hold    Seated marching  2 minutes          Blank cell = exercise not performed today                                    1/3 EXERCISE LOG  Exercise Repetitions and Resistance Comments  Nustep  L3 x 15 minutes   Cybex knee flexion  30# x 3 minutes    Cybex knee extension 10# x 3 minutes   Seated heel /toe raises 30 reps   Seated Marches 3# x 2.5 mins   Ball Squeezes X3 mins   Seated Hip Abduction Green tband x3 mins   STS X10 reps    Blank cell = exercise not performed today                                     EXERCISE LOG 11/15/22:  Exercise Repetitions and Resistance Comments  Nustep  L3 x15 minutes   Ham curls 30# x 2 minutes   Knee ext 10# x 2 minutes.   Clam Green x20 reps   Marching seated Green x20 reps         ASSESSMENT:  CLINICAL IMPRESSION: Patient has made no significant progress with skilled physical therapy as evidenced by her subjective reports, objective measures, functional mobility, and progress toward her goals since her progress report on 12/12/22. She was provided an updated HEP which included LAQ, seated marching, and seated hip adduction for improved lower extremity strength. She reported feeling comfortable with these interventions. She is being discharged from skilled physical therapy at this time due to her lack of progress.   PHYSICAL THERAPY DISCHARGE SUMMARY  Visits from Start of Care: 12  Current functional level related to goals / functional outcomes: Patient was unable to meet her goals for skilled physical therapy.    Remaining deficits: Lower extremity strength, power, endurance, and pain    Education / Equipment: HEP    Patient agrees to discharge. Patient goals were not met. Patient is being discharged due to lack of progress.   OBJECTIVE IMPAIRMENTS Abnormal gait, decreased endurance, decreased ROM, decreased strength, increased muscle spasms, postural dysfunction, and pain.   ACTIVITY LIMITATIONS carrying, lifting, bending, and locomotion level  PARTICIPATION  LIMITATIONS: meal prep, cleaning, and laundry  PERSONAL FACTORS Time since onset of injury/illness/exacerbation are also affecting patient's functional outcome.   REHAB POTENTIAL: Good  CLINICAL DECISION MAKING: Evolving/moderate complexity  EVALUATION COMPLEXITY: Moderate  GOALS:  SHORT TERM GOALS: Target date: 12/26/2022  Ind with initial HEP. Baseline: patient reports that she has been doing her HEP about twice per day Goal status: MET  LONG TERM GOALS: Target date: 11/06/2022  Ind with advanced HEP. Baseline: She reported that she primarily lays in bed at home Goal status: NOT MET  2.  Improve TUG by 6 seconds Baseline: 26 seconds at initial evaluation; 28.55 seconds on 12/12/22; 25.40 seconds Goal status: NOT MET  3.  Improve 5 time sit to stand by 6 seconds. Baseline: 22 seconds at initial evaluation; 32.84 seconds with UE support from armrests on 12/12/22; 27.23 seconds Goal status: NOT MET  4.  Perform ADL's with LB and knee pain not to exceed 4/10. Baseline: 6/10 on 01/05/23 Goal status: NOT MET  PLAN: PT FREQUENCY: 2x/week  PT DURATION: 6 weeks  PLANNED INTERVENTIONS: Therapeutic exercises, Therapeutic activity, Neuromuscular re-education, Balance training, Gait training, Patient/Family education, Self Care, Electrical stimulation, Cryotherapy, Moist heat, Ultrasound, and Manual therapy  PLAN FOR NEXT SESSION: Postural improvement exercises, core exercise progression, Nustep.  Please monitor 02 sat and BP.  Darlin Coco, PT 01/05/2023, 12:27 PM

## 2023-01-09 NOTE — Addendum Note (Signed)
Addended by: Alphonzo Dublin on: 01/09/2023 11:39 AM   Modules accepted: Orders

## 2023-01-13 DIAGNOSIS — G4733 Obstructive sleep apnea (adult) (pediatric): Secondary | ICD-10-CM | POA: Diagnosis not present

## 2023-01-17 DIAGNOSIS — R0902 Hypoxemia: Secondary | ICD-10-CM | POA: Diagnosis not present

## 2023-01-24 ENCOUNTER — Telehealth: Payer: Self-pay | Admitting: *Deleted

## 2023-01-24 NOTE — Telephone Encounter (Signed)
Called and notified patient that to get an inogen machine she will need an ov with a walk within 30 days of order with sats dropping below 88% on room air during walk.  She has not been seen since 10/2022 so would need an ov. Offered to schedule an appt for patient to come in for ov and have walk that day and she was agreeable. Scheduled pt for 2/13 at 1:45 pm. Nothing further needed at this time.

## 2023-01-25 DIAGNOSIS — H02132 Senile ectropion of right lower eyelid: Secondary | ICD-10-CM | POA: Diagnosis not present

## 2023-01-25 DIAGNOSIS — H04203 Unspecified epiphora, bilateral lacrimal glands: Secondary | ICD-10-CM | POA: Diagnosis not present

## 2023-01-25 DIAGNOSIS — H02135 Senile ectropion of left lower eyelid: Secondary | ICD-10-CM | POA: Diagnosis not present

## 2023-01-26 ENCOUNTER — Telehealth: Payer: Self-pay | Admitting: Family Medicine

## 2023-01-26 DIAGNOSIS — K219 Gastro-esophageal reflux disease without esophagitis: Secondary | ICD-10-CM

## 2023-01-26 NOTE — Telephone Encounter (Signed)
  Prescription Request  01/26/2023  Is this a "Controlled Substance" medicine?   Have you seen your PCP in the last 2 weeks? Appt 4/4  If YES, route message to pool  -  If NO, patient needs to be scheduled for appointment.  What is the name of the medication or equipment? omeprazole (PRILOSEC) 20 MG capsule   Have you contacted your pharmacy to request a refill? yes   Which pharmacy would you like this sent to? [facility]   Patient notified that their request is being sent to the clinical staff for review and that they should receive a response within 2 business days.

## 2023-01-26 NOTE — Telephone Encounter (Signed)
Instructed that PCP back in June 2023 sent in a years worth of refills and she should still have refills on file, she will check w[facility].

## 2023-01-29 ENCOUNTER — Ambulatory Visit (INDEPENDENT_AMBULATORY_CARE_PROVIDER_SITE_OTHER): Payer: 59 | Admitting: Family Medicine

## 2023-01-29 ENCOUNTER — Ambulatory Visit (INDEPENDENT_AMBULATORY_CARE_PROVIDER_SITE_OTHER): Payer: 59

## 2023-01-29 ENCOUNTER — Encounter: Payer: Self-pay | Admitting: Family Medicine

## 2023-01-29 VITALS — BP 139/58 | HR 64 | Ht 61.0 in | Wt 182.0 lb

## 2023-01-29 DIAGNOSIS — E1159 Type 2 diabetes mellitus with other circulatory complications: Secondary | ICD-10-CM

## 2023-01-29 DIAGNOSIS — K5901 Slow transit constipation: Secondary | ICD-10-CM

## 2023-01-29 DIAGNOSIS — R29898 Other symptoms and signs involving the musculoskeletal system: Secondary | ICD-10-CM | POA: Diagnosis not present

## 2023-01-29 DIAGNOSIS — M5136 Other intervertebral disc degeneration, lumbar region: Secondary | ICD-10-CM | POA: Diagnosis not present

## 2023-01-29 DIAGNOSIS — R1084 Generalized abdominal pain: Secondary | ICD-10-CM

## 2023-01-29 DIAGNOSIS — I152 Hypertension secondary to endocrine disorders: Secondary | ICD-10-CM | POA: Diagnosis not present

## 2023-01-29 DIAGNOSIS — K5900 Constipation, unspecified: Secondary | ICD-10-CM | POA: Diagnosis not present

## 2023-01-29 MED ORDER — POLYETHYLENE GLYCOL 3350 17 GM/SCOOP PO POWD: 17.0000 g | Freq: Every day | ORAL | 1 refills | Status: DC

## 2023-01-29 MED ORDER — FLUCONAZOLE 150 MG PO TABS: 150.0000 mg | ORAL_TABLET | Freq: Once | ORAL | 0 refills | Status: AC

## 2023-01-29 NOTE — Progress Notes (Signed)
BP (!) 139/58   Pulse 64   Ht '5\' 1"'$  (1.549 m)   Wt 182 lb (82.6 kg)   SpO2 95%   BMI 34.39 kg/m    Subjective:   Patient ID: Danielle Parrish, female    DOB: 01/11/1941, 82 y.o.   MRN: [id number]  HPI: Danielle Parrish is a 82 y.o. female presenting on 01/29/2023 for Medical Management of Chronic Issues, Hypertension, and Vaginitis (Would like another dose of Diflucan)   HPI Bilateral lower extremity weakness Patient comes in with complaints of worsening again bilateral lower extremity weakness.  Fatigue.  She has trouble going down her stairs to do laundry and on house.  Her laundry is in the basement.  She says she feels worn out because of the decreased strength.  She denies any shortness of breath or chest pain.  Hypertension Patient is currently on amlodipine and furosemide as needed and metoprolol and olmesartan.  She says her blood pressures been running up and down sometimes., and their blood pressure today is 139/58. Patient denies any lightheadedness or dizziness. Patient denies headaches, blurred vision, chest pains, shortness of breath, or weakness. Denies any side effects from medication and is content with current medication.  She has been monitoring her blood pressures been running anywhere from the 130s to the 160s over the 50s to 70s.  Patient's other biggest complaint that she feels abdominal swelling and stomach pain and that she has been having smaller harder bowel movements every couple days and has been worsening over the past couple weeks.  She says she normally has a bowel movement that is good sized every day but has not been having that more recently.  Patient still feels like she might have a little yeast infection in groin and wants another Diflucan.  Relevant past medical, surgical, family and social history reviewed and updated as indicated. Interim medical history since our last visit reviewed. Allergies and medications reviewed and updated.  Review of  Systems  Constitutional:  Negative for chills and fever.  Eyes:  Negative for visual disturbance.  Respiratory:  Negative for chest tightness and shortness of breath.   Cardiovascular:  Negative for chest pain and leg swelling.  Gastrointestinal:  Positive for abdominal distention, abdominal pain and constipation.  Genitourinary:  Negative for difficulty urinating, dysuria and frequency.  Musculoskeletal:  Positive for gait problem.  Skin:  Positive for rash.  Neurological:  Positive for weakness. Negative for light-headedness and headaches.  Psychiatric/Behavioral:  Negative for agitation and behavioral problems.   All other systems reviewed and are negative.   Per HPI unless specifically indicated above   Allergies as of 01/29/2023       Reactions   Nsaids    Burns stomach.    Penicillins Hives, Other (See Comments)   Has patient had a PCN reaction causing immediate rash, facial/tongue/throat swelling, SOB or lightheadedness with hypotension: Yes Has patient had a PCN reaction causing severe rash involving mucus membranes or skin necrosis: unknown Has patient had a PCN reaction that required hospitalization No Has patient had a PCN reaction occurring within the last 10 years: No If all of the above answers are "NO", then may proceed with Cephalosporin use.   Rosuvastatin Other (See Comments)   Body aches.   Statins Other (See Comments)   Vytorin [ezetimibe-simvastatin]    Body aches.    Aspirin Nausea Only   Tolmetin Nausea Only   Burns stomach.         Medication List  Accurate as of January 29, 2023  9:51 AM. If you have any questions, ask your nurse or doctor.          acetaminophen 500 MG tablet Commonly known as: TYLENOL Take 1 tablet (500 mg total) by mouth every 6 (six) hours as needed.   albuterol 108 (90 Base) MCG/ACT inhaler Commonly known as: VENTOLIN HFA Take 2 puffs eveery 6 hours as needed for wheezing or shortness of breath   amLODipine  10 MG tablet Commonly known as: NORVASC Take 1 tablet (10 mg total) by mouth daily.   azelastine 0.1 % nasal spray Commonly known as: ASTELIN Place 1 spray into both nostrils 2 (two) times daily. Use in each nostril as directed   calcium-vitamin D 500-200 MG-UNIT Tabs tablet Commonly known as: OSCAL WITH D TAKE 1 TABLET BY MOUTH EVERY DAY WITH BREAKFAST   cetirizine 10 MG tablet Commonly known as: ZYRTEC Take 1 tablet (10 mg total) by mouth daily.   cyclobenzaprine 5 MG tablet Commonly known as: FLEXERIL Take 1 tablet (5 mg total) by mouth 3 (three) times daily as needed for muscle spasms.   diclofenac Sodium 1 % Gel Commonly known as: VOLTAREN APPLY 4 GRAMS TOPICALLY 4 (FOUR) TIMES DAILY.   diphenhydrAMINE 25 MG tablet Commonly known as: BENADRYL Take 50 mg by mouth at bedtime as needed for allergies.   fluconazole 150 MG tablet Commonly known as: DIFLUCAN Take 1 tablet (150 mg total) by mouth once for 1 dose. Started by: Worthy Rancher, MD   furosemide 20 MG tablet Commonly known as: LASIX Take 1 tablet (20 mg total) by mouth daily.   gabapentin 300 MG capsule Commonly known as: NEURONTIN TAKE 1 CAPSULE BY MOUTH 3 TIMES DAILY   Iron 325 (65 Fe) MG Tabs Take 1 tablet by mouth 3 (three) times daily.   ketoconazole 2 % cream Commonly known as: NIZORAL APPLY TO AFFECTED AREA EVERYDAY   levothyroxine 100 MCG tablet Commonly known as: SYNTHROID TAKE 1 TABLET BY MOUTH EVERY DAY BEFORE BREAKFAST   lidocaine 5 % Commonly known as: LIDODERM PLACE 1 PATCH ONTO THE SKIN DAILY. REMOVE & DISCARD PATCH WITHIN 12 HOURS OR AS DIRECTED BY MD   lovastatin 40 MG tablet Commonly known as: MEVACOR TAKE 1 TABLET BY MOUTH EVERYDAY AT BEDTIME   metoprolol succinate 25 MG 24 hr tablet Commonly known as: TOPROL-XL Take 1 tablet (25 mg total) by mouth daily.   mirabegron ER 25 MG Tb24 tablet Commonly known as: Myrbetriq Take 1 tablet (25 mg total) by mouth daily.    Multi-Vitamin tablet Take by mouth.   nitrofurantoin (macrocrystal-monohydrate) 100 MG capsule Commonly known as: Macrobid Take 1 capsule (100 mg total) by mouth 2 (two) times daily. 1 po BId   nystatin cream Commonly known as: MYCOSTATIN APPLY TO AFFECTED AREA TWICE A DAY   nystatin powder Commonly known as: MYCOSTATIN/NYSTOP APPLY TO AFFECTED AREA 4 TIMES A DAY   olmesartan 20 MG tablet Commonly known as: BENICAR TAKE 1 TABLET BY MOUTH EVERY DAY   omeprazole 20 MG capsule Commonly known as: PRILOSEC Take 1 capsule (20 mg total) by mouth 2 (two) times daily before a meal.   polyethylene glycol powder 17 GM/SCOOP powder Commonly known as: GLYCOLAX/MIRALAX Take 17 g by mouth daily.   PRESERVISION AREDS 2 PO Take by mouth.   pyridOXINE 100 MG tablet Commonly known as: VITAMIN B6 Take 100 mg by mouth daily.   sertraline 100 MG tablet Commonly known as: ZOLOFT TAKE  2 TABLETS BY MOUTH EVERY DAY   sodium chloride 0.65 % Soln nasal spray Commonly known as: OCEAN Place 1 spray into both nostrils as needed for congestion.   VITAMIN D-3 PO Take 1 capsule by mouth daily.         Objective:   BP (!) 139/58   Pulse 64   Ht '5\' 1"'$  (1.549 m)   Wt 182 lb (82.6 kg)   SpO2 95%   BMI 34.39 kg/m   Wt Readings from Last 3 Encounters:  01/29/23 182 lb (82.6 kg)  12/27/22 177 lb (80.3 kg)  12/05/22 181 lb 6.4 oz (82.3 kg)    Physical Exam Vitals and nursing note reviewed.  Constitutional:      General: She is not in acute distress.    Appearance: She is well-developed. She is not diaphoretic.  Eyes:     Conjunctiva/sclera: Conjunctivae normal.  Cardiovascular:     Rate and Rhythm: Normal rate and regular rhythm.     Heart sounds: Normal heart sounds. No murmur heard. Pulmonary:     Effort: Pulmonary effort is normal. No respiratory distress.     Breath sounds: Normal breath sounds. No wheezing.  Abdominal:     General: Abdomen is flat. Bowel sounds are normal.  There is distension.     Palpations: Abdomen is soft.     Tenderness: There is generalized abdominal tenderness. There is no right CVA tenderness, left CVA tenderness, guarding or rebound.  Musculoskeletal:        General: No tenderness. Normal range of motion.  Skin:    General: Skin is warm and dry.     Findings: No rash.  Neurological:     Mental Status: She is alert and oriented to person, place, and time.     Coordination: Coordination normal.  Psychiatric:        Behavior: Behavior normal.      KUB: Stool noted throughout the large colon.  Gas bubble in the stomach.  Normal x-ray other than that.  Await final read from radiologist. Assessment & Plan:   Problem List Items Addressed This Visit       Cardiovascular and Mediastinum   Hypertension associated with type 2 diabetes mellitus ([place]) - Primary     Other   Generalized abdominal pain   Relevant Orders   DG Abd 1 View   Other Visit Diagnoses     Slow transit constipation       Relevant Medications   polyethylene glycol powder (GLYCOLAX/MIRALAX) 17 GM/SCOOP powder   Other Relevant Orders   DG Abd 1 View   Degenerative disc disease, lumbar       Relevant Orders   Ambulatory referral to Physical Therapy   Weakness of both lower extremities       Relevant Orders   Ambulatory referral to Physical Therapy     Because of age will allow permissive hypertension.  He does drop down especially on the diastolic number and do not want to push down any further and have her fall because of her wide pulse pressure.  KUB shows significant constipation, she admits she is not using the MiraLAX but very infrequently.  Recommended that she is using more frequently and drink more water.    Follow up plan: Return if symptoms worsen or fail to improve, for Already has appointment April 4.  Counseling provided for all of the vaccine components Orders Placed This Encounter  Procedures   DG Abd 1 View   Ambulatory referral  to  Physical Therapy    Caryl Pina, MD [facility] 01/29/2023, 9:51 AM

## 2023-02-06 ENCOUNTER — Encounter: Payer: Self-pay | Admitting: Internal Medicine

## 2023-02-06 ENCOUNTER — Ambulatory Visit (INDEPENDENT_AMBULATORY_CARE_PROVIDER_SITE_OTHER): Payer: 59 | Admitting: Internal Medicine

## 2023-02-06 ENCOUNTER — Ambulatory Visit (HOSPITAL_COMMUNITY)
Admission: RE | Admit: 2023-02-06 | Discharge: 2023-02-06 | Disposition: A | Discharge status: Home / Self Care | Payer: 59 | Source: Ambulatory Visit | Attending: Internal Medicine | Admitting: Internal Medicine

## 2023-02-06 VITALS — BP 136/80 | HR 74 | Ht 61.0 in | Wt 181.4 lb

## 2023-02-06 DIAGNOSIS — R0609 Other forms of dyspnea: Secondary | ICD-10-CM | POA: Diagnosis not present

## 2023-02-06 DIAGNOSIS — R0602 Shortness of breath: Secondary | ICD-10-CM | POA: Diagnosis not present

## 2023-02-06 DIAGNOSIS — J9611 Chronic respiratory failure with hypoxia: Secondary | ICD-10-CM | POA: Diagnosis not present

## 2023-02-06 DIAGNOSIS — R059 Cough, unspecified: Secondary | ICD-10-CM | POA: Diagnosis not present

## 2023-02-06 MED ORDER — PREDNISONE 10 MG PO TABS: ORAL_TABLET | ORAL | 0 refills | Status: DC

## 2023-02-06 NOTE — Progress Notes (Unsigned)
Danielle Parrish [place], female    DOB: May 01, 1941   MRN: [id number]   Brief patient profile:  10   yowf never smoker  referred to pulmonary clinic 07/04/2022 by Dr Rexene Alberts  for cough p shoulder injury requiring GA and surgery in Aug of 2022.  [employer] shopping still able to do groceries leaning on cart / mb and back flat x 75 ft with walker   History of Present Illness  07/04/2022  Pulmonary/ 1st office eval/Corley Maffeo on Macrodantin Chief Complaint  Patient presents with   Consult    Pt states she has some SOB and a runny nose x1 year. Pt states she has Oxygen and CPAP machine but she does not use the CPAP machine.   Dyspnea:  mb and back slowly = MMRC3 = can't walk 100 yards even at a slow pace at a flat grade s stopping due to sob   Cough: voice fatigue  Sleep: cpap/02  SABA use: minimal better    Rec You will need to get the echocardiogram asap - call this office if can't schedule it within  a week  Stop macrodantin  Please schedule a follow up office visit in 3- 4 weeks, sooner if needed  - [place] [facility]  - bring all medications Late add: pt instructed on use of amb 02 = goal is to keep > 90% saturations at all times     08/22/2022  f/u ov/[place] office/Breunna Nordmann re: doe maint on ?  (Did not bring meds)   Chief Complaint  Patient presents with   Follow-up    Feels breathing is not doing well. Knees are bothering patient today.   Dyspnea:  walking mailbox and back but not checking sats despite "feeling breathing worse"  Cough: none but does have mild globus sensation on ACEi Sleeping: cpap/02 sleeping 0k SABA use: not really helping sob  02: not using x during the noct  Rec Stop lisinopril  and start olmesartan 20 mg on daily in its place to see if helps breathing Amlodipine may be making your swell up and need to reduce the dose if blood pressure too low  Make sure you check your oxygen saturation  AT  your highest level of activity (not after you stop)   to be sure it stays over  90%       11/14/2022  f/u ov/Doyce Saling re: doe very mild, not long er cough off ACEi  Chief Complaint  Patient presents with   Follow-up    Breathing same since last ov.  Pt was very dizzy and had unsteady gait walking to room   Dyspnea:  limited more by balance than breathing / sats always low 90s even with max walking  Cough: sensation of globus/ also pnds  Sleeping: cpap and 02 per [place]  SABA use:  only uses p exertion  02: just at hs  Rec Only use your albuterol as a rescue medication  Also  Ok to try albuterol 15 min before an activity (on alternating days)  that you know would usually make you short of breath   Dr Warrick Parisian may wish to add a very low dose of diuretic to your next  olmesartan  refill  Pulmonary follow up is as needed     02/06/2023  f/u ov/[place] office/Evie Crumpler re: doe x 3 weeks assoc with mostly nasal congestion/ cough  S/p macrodantin rx 01/01/23  Chief Complaint  Patient presents with   Follow-up    Breathing about the same since last  ov.  Patient wants an inogen machine  Dyspnea:  walks to mb 100 ft flat s 02  Cough: none now/ has improved Sleeping: cpap/ 02  level bed head raised on 3 pillows SABA use: occ / seem to helped  02: just at hs though has portable at home not using   No obvious day to day or daytime variability or assoc excess/ purulent sputum or mucus plugs or hemoptysis or cp or chest tightness, subjective wheeze or overt  hb symptoms.   Sleeping  without nocturnal  or early am exacerbation  of respiratory  c/o's or need for noct saba. Also denies any obvious fluctuation of symptoms with weather or environmental changes or other aggravating or alleviating factors except as outlined above   No unusual exposure hx or h/o childhood pna/ asthma or knowledge of premature birth.  Current Allergies, Complete Past Medical History, Past Surgical History, Family History, and Social History were reviewed in [employer]  record.  ROS  The following are not active complaints unless bolded Hoarseness, sore throat, dysphagia, dental problems, itching, sneezing,  nasal congestion or discharge of excess mucus or purulent secretions, ear ache,   fever, chills, sweats, unintended wt loss or wt gain, classically pleuritic or exertional cp,  orthopnea pnd or arm/hand swelling  or leg swelling, presyncope, palpitations, abdominal pain, anorexia, nausea, vomiting, diarrhea  or change in bowel habits or change in bladder habits, change in stools or change in urine, dysuria, hematuria,  rash, arthralgias, visual complaints, headache, numbness, weakness or ataxia or problems with walking or coordination,  change in mood or  memory.        Current Meds  Medication Sig   acetaminophen (TYLENOL) 500 MG tablet Take 1 tablet (500 mg total) by mouth every 6 (six) hours as needed.   albuterol (VENTOLIN HFA) 108 (90 Base) MCG/ACT inhaler Take 2 puffs eveery 6 hours as needed for wheezing or shortness of breath   amLODipine (NORVASC) 10 MG tablet Take 1 tablet (10 mg total) by mouth daily.   azelastine (ASTELIN) 0.1 % nasal spray Place 1 spray into both nostrils 2 (two) times daily. Use in each nostril as directed   calcium-vitamin D (OSCAL WITH D) 500-200 MG-UNIT TABS tablet TAKE 1 TABLET BY MOUTH EVERY DAY WITH BREAKFAST   cetirizine (ZYRTEC) 10 MG tablet Take 1 tablet (10 mg total) by mouth daily.   Cholecalciferol (VITAMIN D-3 PO) Take 1 capsule by mouth daily.    cyclobenzaprine (FLEXERIL) 5 MG tablet Take 1 tablet (5 mg total) by mouth 3 (three) times daily as needed for muscle spasms.   diclofenac Sodium (VOLTAREN) 1 % GEL APPLY 4 GRAMS TOPICALLY 4 (FOUR) TIMES DAILY.   diphenhydrAMINE (BENADRYL) 25 MG tablet Take 50 mg by mouth at bedtime as needed for allergies.    Ferrous Sulfate (IRON) 325 (65 Fe) MG TABS Take 1 tablet by mouth 3 (three) times daily.   furosemide (LASIX) 20 MG tablet Take 1 tablet (20 mg total) by mouth  daily.   gabapentin (NEURONTIN) 300 MG capsule TAKE 1 CAPSULE BY MOUTH 3 TIMES DAILY   ketoconazole (NIZORAL) 2 % cream APPLY TO AFFECTED AREA EVERYDAY   levothyroxine (SYNTHROID) 100 MCG tablet TAKE 1 TABLET BY MOUTH EVERY DAY BEFORE BREAKFAST   lidocaine (LIDODERM) 5 % PLACE 1 PATCH ONTO THE SKIN DAILY. REMOVE & DISCARD PATCH WITHIN 12 HOURS OR AS DIRECTED BY MD   lovastatin (MEVACOR) 40 MG tablet TAKE 1 TABLET BY MOUTH EVERYDAY  AT BEDTIME   metoprolol succinate (TOPROL-XL) 25 MG 24 hr tablet Take 1 tablet (25 mg total) by mouth daily.   mirabegron ER (MYRBETRIQ) 25 MG TB24 tablet Take 1 tablet (25 mg total) by mouth daily.   Multiple Vitamin (MULTI-VITAMIN) tablet Take by mouth.   Multiple Vitamins-Minerals (PRESERVISION AREDS 2 PO) Take by mouth.   nitrofurantoin, macrocrystal-monohydrate, (MACROBID) 100 MG capsule Take 1 capsule (100 mg total) by mouth 2 (two) times daily. 1 po BId   nystatin (MYCOSTATIN/NYSTOP) powder APPLY TO AFFECTED AREA 4 TIMES A DAY   nystatin cream (MYCOSTATIN) APPLY TO AFFECTED AREA TWICE A DAY   olmesartan (BENICAR) 20 MG tablet TAKE 1 TABLET BY MOUTH EVERY DAY   omeprazole (PRILOSEC) 20 MG capsule Take 1 capsule (20 mg total) by mouth 2 (two) times daily before a meal.   polyethylene glycol powder (GLYCOLAX/MIRALAX) 17 GM/SCOOP powder Take 17 g by mouth daily.   pyridOXINE (VITAMIN B-6) 100 MG tablet Take 100 mg by mouth daily.   sertraline (ZOLOFT) 100 MG tablet TAKE 2 TABLETS BY MOUTH EVERY DAY   sodium chloride (OCEAN) 0.65 % SOLN nasal spray Place 1 spray into both nostrils as needed for congestion.                     Past Medical History:  Diagnosis Date   Anxiety    Arthritis    CHF (congestive heart failure) (HCC)    Chronic pain of multiple joints    Depression    Essential hypertension    GERD (gastroesophageal reflux disease)    Hyperlipidemia    Hyperlipidemia associated with type 2 diabetes mellitus ([place])    Hypertension associated with  type 2 diabetes mellitus ([place])    Hypothyroidism    Mild scoliosis 10/19/2021   Osteoporosis    Sleep apnea    CPAP   Type 2 diabetes mellitus with hypercholesterolemia (HCC)    Vitamin D deficiency        Objective:    02/06/2023       181   11/14/2022     179   08/22/22 173 lb 12.8 oz (78.8 kg)  08/02/22 172 lb (78 kg)  07/04/22 173 lb 3.2 oz (78.6 kg)     Vital signs reviewed  02/06/2023  - Note at rest 02 sats  92% on 2lpm    General appearance:  mod   obese amb wf  nad     HEENT : Oropharynx  clear       NECK :  without  apparent JVD/ palpable Nodes/TM    LUNGS: no acc muscle use,  mod severe contour chest which is clear to A and P bilaterally without cough on insp or exp maneuvers   CV:  RRR  no s3 or 2-3/6 sem  s increase in P2, and no edema   ABD:  obese soft and nontender    MS:  Nl gait/ ext warm without deformities Or obvious joint restrictions  calf tenderness, cyanosis or clubbing    SKIN: warm and dry without lesions    NEURO:  alert, approp, nl sensorium with  no motor or cerebellar deficits apparent.       CXR PA and Lateral:   02/06/2023 :    I personally reviewed images and impression is as follows:     Mod severe T kyphosis/ reduced lung vols/CM with non-specific increase in markings     Labs ordered/ reviewed:      Chemistry  Component Value Date/Time   NA 141 02/06/2023 1455   K 4.4 02/06/2023 1455   CL 101 02/06/2023 1455   CO2 23 02/06/2023 1455   BUN 13 02/06/2023 1455   CREATININE 0.63 02/06/2023 1455   CREATININE 0.86 05/15/2013 0941      Component Value Date/Time   CALCIUM 10.7 (H) 02/06/2023 1455   ALKPHOS 78 09/20/2022 1334   AST 17 09/20/2022 1334   ALT 22 09/20/2022 1334   BILITOT 0.5 09/20/2022 1334         Lab Results  Component Value Date   DDIMER 0.31 02/06/2023      Lab Results  Component Value Date   TSH 0.606 12/27/2022        Lab Results  Component Value Date   ESRSEDRATE 10 02/06/2023    ESRSEDRATE 7 07/04/2022   ESRSEDRATE 17 05/24/2017       BNP      02/06/2023      29         Assessment

## 2023-02-06 NOTE — Patient Instructions (Signed)
Prednisone 10 mg take  4 each am x 2 days,   2 each am x 2 days,  1 each am x 2 days and stop   Make sure you check your oxygen saturation  AT  your highest level of activity (not after you stop)   to be sure it stays over 90% and adjust  02 flow upward to maintain this level if needed but remember to turn it back to previous settings when you stop (to conserve your supply).   Please remember to go to the lab department   for your tests - we will call you with the results when they are available.      Please remember to go to the  x-ray department  @  [facility] for your tests - we will call you with the results when they are available     Please schedule a follow up office visit in 4 weeks, sooner if needed  with all medications /inhalers/ solutions in hand so we can verify exactly what you are taking. This includes all medications from all doctors and over the counters

## 2023-02-07 ENCOUNTER — Encounter: Payer: Self-pay | Admitting: Internal Medicine

## 2023-02-07 LAB — BASIC METABOLIC PANEL WITH GFR
BUN/Creatinine Ratio: 21 (ref 12–28)
BUN: 13 mg/dL (ref 8–27)
CO2: 23 mmol/L (ref 20–29)
Calcium: 10.7 mg/dL — ABNORMAL HIGH (ref 8.7–10.3)
Chloride: 101 mmol/L (ref 96–106)
Creatinine, Ser: 0.63 mg/dL (ref 0.57–1.00)
Glucose: 103 mg/dL — ABNORMAL HIGH (ref 70–99)
Potassium: 4.4 mmol/L (ref 3.5–5.2)
Sodium: 141 mmol/L (ref 134–144)
eGFR: 89 mL/min/{1.73_m2}

## 2023-02-07 LAB — SEDIMENTATION RATE: Sed Rate: 10 mm/hr (ref 0–40)

## 2023-02-07 LAB — D-DIMER, QUANTITATIVE: D-DIMER: 0.31 mg/L FEU (ref 0.00–0.49)

## 2023-02-07 LAB — BRAIN NATRIURETIC PEPTIDE: BNP: 29.1 pg/mL (ref 0.0–100.0)

## 2023-02-07 NOTE — Assessment & Plan Note (Signed)
Desat  on ambulation 07/04/2022  > d/c'd macrodantin  - 07/04/2022   Walked on RA  x  2  lap(s) =  approx 500  ft  @ slow pace, stopped due to desats/sob with lowest 02 sats 80% > rec use portable 02 to maintain > 90% (already had it at time of ov but didn't bring it with her)    - 08/22/2022   Walked on RA  x  2  lap(s) =  approx 300  ft  @ slow/cane pace, stopped due to knee pain> sob  with lowest 02 sats 88%   - 11/14/2022 no longer desats walking as of  11/14/2022  - 02/06/2023 room air 150 ft walking > SOB with sats down to 84% - placed patient on 2LO2 cont and sats increased to 92% walking   Advised:  No need for 02 at rest but continue 2lpm with cpap and Make sure you check your oxygen saturation  AT  your highest level of activity (not after you stop)   to be sure it stays over 90% and adjust  02 flow upward to maintain this level if needed but remember to turn it back to previous settings when you stop (to conserve your supply).   Hold off on POC until see if we can correct the problem of ex desats first (see sep a/p) and f/u in 4 weeks with all meds in hand using a trust but verify approach to confirm accurate Medication  Reconciliation The principal here is that until we are certain that the  patients are doing what we've asked, it makes no sense to ask them to do more.

## 2023-02-07 NOTE — Assessment & Plan Note (Signed)
Onset Aug 2022  p GA for L shoulder surgery while on chronic macrodantin - macrodantin d/c 07/04/2022  - Echo [id number]  1 diastolic dysfunction  Mild AS  - 08/22/2022 try off acei   - S/p macrodantin rx 01/01/23 > worse sats 02/06/2023 rec list as allergy   She has many reasons to have doe though the only change since last ov was a course of macrodantin, albeit only 7 days - there are forms of acute marcodantin pneumomitis from short courses so rec avoid this drug going forward and rx with pred x 6 days going forward.

## 2023-02-09 ENCOUNTER — Telehealth: Payer: Self-pay | Admitting: Family Medicine

## 2023-02-09 ENCOUNTER — Encounter: Payer: Self-pay | Admitting: Family Medicine

## 2023-02-09 ENCOUNTER — Other Ambulatory Visit: Payer: Self-pay

## 2023-02-09 DIAGNOSIS — N644 Mastodynia: Secondary | ICD-10-CM

## 2023-02-09 NOTE — Telephone Encounter (Signed)
Orders placed - called patient no answer no voice mail

## 2023-02-09 NOTE — Progress Notes (Signed)
Tried calling the pt and there was no answer and no VM picked up.

## 2023-02-09 NOTE — Telephone Encounter (Signed)
Pts needs diagnostic order sent for her to have her mammogram done at [facility] in [place] since she has breast pain.   Please advise and call patient.

## 2023-02-17 DIAGNOSIS — R0902 Hypoxemia: Secondary | ICD-10-CM | POA: Diagnosis not present

## 2023-02-23 ENCOUNTER — Telehealth: Payer: Self-pay | Admitting: Family Medicine

## 2023-02-23 ENCOUNTER — Telehealth (INDEPENDENT_AMBULATORY_CARE_PROVIDER_SITE_OTHER): Payer: 59 | Admitting: Family Medicine

## 2023-02-23 ENCOUNTER — Encounter: Payer: Self-pay | Admitting: Family Medicine

## 2023-02-23 DIAGNOSIS — R1031 Right lower quadrant pain: Secondary | ICD-10-CM | POA: Diagnosis not present

## 2023-02-23 NOTE — Telephone Encounter (Signed)
Patient calling back because she forgot to tell Tiffany that she is also having stomach pain and has had a knot in her vagina for a few weeks. She said there is no pain associated with the knot but wanted to know if this could be contributing to the pain that she is having. Please call back and advise.

## 2023-02-23 NOTE — Progress Notes (Signed)
Virtual Visit  Note Due to COVID-19 pandemic this visit was conducted virtually. This visit type was conducted due to national recommendations for restrictions regarding the COVID-19 Pandemic (e.g. social distancing, sheltering in place) in an effort to limit this patient's exposure and mitigate transmission in our community. All issues noted in this document were discussed and addressed.  A physical exam was not performed with this format.  I connected with Danielle Parrish on 02/23/23 at 1238 by telephone and verified that I am speaking with the correct person using two identifiers. Danielle Parrish is currently located at home and no one is currently with her during the visit. The provider, Gwenlyn Perking, FNP is located in their office at time of visit.  I discussed the limitations, risks, security and privacy concerns of performing an evaluation and management service by telephone and the availability of in person appointments. I also discussed with the patient that there may be a patient responsible charge related to this service. The patient expressed understanding and agreed to proceed.  CC: abdominal pain  History and Present Illness:  Abdominal Pain This is a new problem. The current episode started 1 to 4 weeks ago. The onset quality is gradual. The problem occurs constantly. The problem has been gradually worsening. The pain is located in the RUQ. The quality of the pain is aching. Pain radiation: right lower back. Associated symptoms include belching and flatus. Pertinent negatives include no anorexia, constipation, diarrhea, dysuria, fever, frequency, hematochezia, hematuria, melena, myalgias, nausea, vomiting or weight loss. The pain is aggravated by movement. The pain is relieved by Recumbency. Treatments tried: miralax. The treatment provided no relief. Prior workup: KUB- negative. Her past medical history is significant for GERD. There is no history of Crohn's disease,  gallstones, irritable bowel syndrome, pancreatitis or ulcerative colitis.  Denies tenderness.     Review of Systems  Constitutional:  Negative for chills, fever and weight loss.  Gastrointestinal:  Positive for abdominal pain and flatus. Negative for anorexia, blood in stool, constipation, diarrhea, heartburn, hematochezia, melena, nausea and vomiting.  Genitourinary:  Negative for dysuria, flank pain, frequency, hematuria and urgency.  Musculoskeletal:  Positive for back pain. Negative for myalgias.     Observations/Objective: Alert and oriented x 3. Able to speak in full sentences without difficulty.    Assessment and Plan: Danielle Parrish was seen today for abdominal pain.  Diagnoses and all orders for this visit:  Right lower quadrant abdominal pain Worsening over last few week. Denies tenderness or fever. No other symptoms. Negative KUB. No transportation today for STAT CT. Will order for next week. Instructed to go to ER for new or worsening symptoms.  -     CT Abdomen Pelvis W Contrast; Future     Follow Up Instructions: As needed.     I discussed the assessment and treatment plan with the patient. The patient was provided an opportunity to ask questions and all were answered. The patient agreed with the plan and demonstrated an understanding of the instructions.   The patient was advised to call back or seek an in-person evaluation if the symptoms worsen or if the condition fails to improve as anticipated.  The above assessment and management plan was discussed with the patient. The patient verbalized understanding of and has agreed to the management plan. Patient is aware to call the clinic if symptoms persist or worsen. Patient is aware when to return to the clinic for a follow-up visit. Patient educated on when it  is appropriate to go to the emergency department.   Time call ended:  1251  I provided 13 minutes of  non face-to-face time during this encounter.    Gwenlyn Perking, FNP

## 2023-02-23 NOTE — Telephone Encounter (Signed)
I do not know that I would be able to tell without doing an exam, if she continues to have this issue then I would have to look at her do an exam and see what that not is to see if it could be contributing or not.

## 2023-02-26 NOTE — Telephone Encounter (Signed)
Appt made for 3/6 at 4:10. Pt aware.

## 2023-02-28 ENCOUNTER — Encounter: Payer: Self-pay | Admitting: Family Medicine

## 2023-02-28 ENCOUNTER — Ambulatory Visit (INDEPENDENT_AMBULATORY_CARE_PROVIDER_SITE_OTHER): Payer: 59 | Admitting: Family Medicine

## 2023-02-28 VITALS — BP 149/54 | HR 58 | Ht 61.0 in | Wt 180.0 lb

## 2023-02-28 DIAGNOSIS — R1031 Right lower quadrant pain: Secondary | ICD-10-CM | POA: Diagnosis not present

## 2023-02-28 DIAGNOSIS — R1084 Generalized abdominal pain: Secondary | ICD-10-CM

## 2023-02-28 NOTE — Progress Notes (Signed)
BP (!) 149/54   Pulse (!) 58   Ht '5\' 1"'$  (1.549 m)   Wt 180 lb (81.6 kg)   SpO2 92%   BMI 34.01 kg/m    Subjective:   Patient ID: Danielle Parrish, female    DOB: 06-Jan-1941, 82 y.o.   MRN: [id number]  HPI: Danielle Parrish is a 82 y.o. female presenting on 02/28/2023 for Abdominal Pain (Generalized. Radiates to back) and Decreased energy.  (Feels so bad.)   HPI Abdominal pain decreased energy Patient comes in complaining of generalized abdominal pain that radiates to her back.  She says she has decreased energy and just not feeling well.  She says this is all been going on for a little over a week.  She was seen prior for this.  She says the worst of the pain is right lower quadrant abdominal pain.  She says she saw one of my colleagues and had a CT scan approved or appointment but then never got called about it.  She says it has not gotten better or worse.  She does complain of some constipation.  She says her energy has been creeping down for some time but is worse with this abdominal pain.  Relevant past medical, surgical, family and social history reviewed and updated as indicated. Interim medical history since our last visit reviewed. Allergies and medications reviewed and updated.  Review of Systems  Constitutional:  Negative for chills and fever.  Eyes:  Negative for redness and visual disturbance.  Respiratory:  Negative for chest tightness and shortness of breath.   Cardiovascular:  Negative for chest pain and leg swelling.  Gastrointestinal:  Positive for abdominal pain and constipation. Negative for diarrhea, nausea and vomiting.  Genitourinary:  Negative for difficulty urinating and dysuria.  Musculoskeletal:  Negative for back pain and gait problem.  Skin:  Negative for rash.  Neurological:  Negative for light-headedness and headaches.  Psychiatric/Behavioral:  Negative for agitation and behavioral problems.   All other systems reviewed and are negative.   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    Penicillins Hives, Other (See Comments)   Has patient had a PCN reaction causing immediate rash, facial/tongue/throat swelling, SOB or lightheadedness with hypotension: Yes Has patient had a PCN reaction causing severe rash involving mucus membranes or skin necrosis: unknown Has patient had a PCN reaction that required hospitalization No Has patient had a PCN reaction occurring within the last 10 years: No If all of the above answers are "NO", then may proceed with Cephalosporin use.   Rosuvastatin Other (See Comments)   Body aches.   Statins Other (See Comments)   Vytorin [ezetimibe-simvastatin]    Body aches.    Aspirin Nausea Only   Tolmetin Nausea Only   Burns stomach.         Medication List        Accurate as of February 28, 2023 11:59 PM. If you have any questions, ask your nurse or doctor.          STOP taking these medications    predniSONE 10 MG tablet Commonly known as: DELTASONE Stopped by: Fransisca Kaufmann Shahed Yeoman, MD       TAKE these medications    acetaminophen 500 MG tablet Commonly known as: TYLENOL Take 1 tablet (500  mg total) by mouth every 6 (six) hours as needed.   albuterol 108 (90 Base) MCG/ACT inhaler Commonly known as: VENTOLIN HFA Take 2 puffs eveery 6 hours as needed for wheezing or shortness of breath   amLODipine 10 MG tablet Commonly known as: NORVASC Take 1 tablet (10 mg total) by mouth daily.   azelastine 0.1 % nasal spray Commonly known as: ASTELIN Place 1 spray into both nostrils 2 (two) times daily. Use in each nostril as directed   calcium-vitamin D 500-200 MG-UNIT Tabs tablet Commonly known as: OSCAL WITH D TAKE 1 TABLET BY MOUTH EVERY DAY WITH BREAKFAST   cetirizine 10 MG tablet Commonly known as: ZYRTEC Take 1 tablet (10 mg  total) by mouth daily.   cyclobenzaprine 5 MG tablet Commonly known as: FLEXERIL Take 1 tablet (5 mg total) by mouth 3 (three) times daily as needed for muscle spasms.   diclofenac Sodium 1 % Gel Commonly known as: VOLTAREN APPLY 4 GRAMS TOPICALLY 4 (FOUR) TIMES DAILY.   diphenhydrAMINE 25 MG tablet Commonly known as: BENADRYL Take 50 mg by mouth at bedtime as needed for allergies.   furosemide 20 MG tablet Commonly known as: LASIX Take 1 tablet (20 mg total) by mouth daily.   gabapentin 300 MG capsule Commonly known as: NEURONTIN TAKE 1 CAPSULE BY MOUTH 3 TIMES DAILY   Iron 325 (65 Fe) MG Tabs Take 1 tablet by mouth 3 (three) times daily.   ketoconazole 2 % cream Commonly known as: NIZORAL APPLY TO AFFECTED AREA EVERYDAY   levothyroxine 100 MCG tablet Commonly known as: SYNTHROID TAKE 1 TABLET BY MOUTH EVERY DAY BEFORE BREAKFAST   lidocaine 5 % Commonly known as: LIDODERM PLACE 1 PATCH ONTO THE SKIN DAILY. REMOVE & DISCARD PATCH WITHIN 12 HOURS OR AS DIRECTED BY MD   lovastatin 40 MG tablet Commonly known as: MEVACOR TAKE 1 TABLET BY MOUTH EVERYDAY AT BEDTIME   metoprolol succinate 25 MG 24 hr tablet Commonly known as: TOPROL-XL Take 1 tablet (25 mg total) by mouth daily.   mirabegron ER 25 MG Tb24 tablet Commonly known as: Myrbetriq Take 1 tablet (25 mg total) by mouth daily.   Multi-Vitamin tablet Take by mouth.   nystatin cream Commonly known as: MYCOSTATIN APPLY TO AFFECTED AREA TWICE A DAY   nystatin powder Commonly known as: MYCOSTATIN/NYSTOP APPLY TO AFFECTED AREA 4 TIMES A DAY   olmesartan 20 MG tablet Commonly known as: BENICAR TAKE 1 TABLET BY MOUTH EVERY DAY   omeprazole 20 MG capsule Commonly known as: PRILOSEC Take 1 capsule (20 mg total) by mouth 2 (two) times daily before a meal.   polyethylene glycol powder 17 GM/SCOOP powder Commonly known as: GLYCOLAX/MIRALAX Take 17 g by mouth daily.   PRESERVISION AREDS 2 PO Take by  mouth.   pyridOXINE 100 MG tablet Commonly known as: VITAMIN B6 Take 100 mg by mouth daily.   sertraline 100 MG tablet Commonly known as: ZOLOFT TAKE 2 TABLETS BY MOUTH EVERY DAY   sodium chloride 0.65 % Soln nasal spray Commonly known as: OCEAN Place 1 spray into both nostrils as needed for congestion.   VITAMIN D-3 PO Take 1 capsule by mouth daily.         Objective:   BP (!) 149/54   Pulse (!) 58   Ht '5\' 1"'$  (1.549 m)   Wt 180 lb (81.6 kg)   SpO2 92%   BMI 34.01 kg/m   Wt Readings from Last 3 Encounters:  02/28/23 180 lb (  81.6 kg)  02/06/23 181 lb 6.4 oz (82.3 kg)  01/29/23 182 lb (82.6 kg)    Physical Exam Vitals and nursing note reviewed.  Constitutional:      General: She is not in acute distress.    Appearance: She is well-developed. She is not diaphoretic.  Eyes:     Conjunctiva/sclera: Conjunctivae normal.  Cardiovascular:     Rate and Rhythm: Normal rate and regular rhythm.     Heart sounds: Normal heart sounds. No murmur heard. Pulmonary:     Effort: Pulmonary effort is normal. No respiratory distress.     Breath sounds: Normal breath sounds. No wheezing.  Abdominal:     General: Abdomen is flat. Bowel sounds are normal. There is no distension.     Palpations: There is mass (Soft mobile right lower quadrant abdominal mass.  Awaiting CT scan, a superficial, most feels like lipoma).     Tenderness: There is abdominal tenderness (Left lower quadrant abdominal pain). There is no right CVA tenderness, left CVA tenderness, guarding or rebound.  Musculoskeletal:        General: No tenderness. Normal range of motion.  Skin:    General: Skin is warm and dry.     Findings: No rash.  Neurological:     Mental Status: She is alert and oriented to person, place, and time.     Coordination: Coordination normal.  Psychiatric:        Behavior: Behavior normal.       Assessment & Plan:   Problem List Items Addressed This Visit       Other    Generalized abdominal pain   Other Visit Diagnoses     Right lower quadrant abdominal pain    -  Primary     It looks like CT scan is already been approved but she is has not been contacted, gave her the number so she can go get the CT scan done and also give her the number for physical therapy because I tried to call her and could not get a hold of her.  Will await CT abdomen and then follow-up from there.  Follow up plan: Return if symptoms worsen or fail to improve.  Counseling provided for all of the vaccine components No orders of the defined types were placed in this encounter.   Caryl Pina, MD [facility] 03/04/2023, 10:22 PM

## 2023-02-28 NOTE — Patient Instructions (Addendum)
[facility]- [phone]  Physical Therapy- [facility]- [phone]

## 2023-03-06 ENCOUNTER — Ambulatory Visit: Admission: RE | Admit: 2023-03-06 | Payer: 59 | Source: Ambulatory Visit

## 2023-03-06 ENCOUNTER — Other Ambulatory Visit: Payer: Self-pay | Admitting: Family Medicine

## 2023-03-06 ENCOUNTER — Ambulatory Visit
Admission: RE | Admit: 2023-03-06 | Discharge: 2023-03-06 | Disposition: A | Discharge status: Home / Self Care | Payer: 59 | Source: Ambulatory Visit | Attending: Family Medicine | Admitting: Family Medicine

## 2023-03-06 DIAGNOSIS — N644 Mastodynia: Secondary | ICD-10-CM

## 2023-03-06 DIAGNOSIS — I700 Atherosclerosis of aorta: Secondary | ICD-10-CM | POA: Diagnosis not present

## 2023-03-06 DIAGNOSIS — R1031 Right lower quadrant pain: Secondary | ICD-10-CM | POA: Diagnosis not present

## 2023-03-06 DIAGNOSIS — Z1231 Encounter for screening mammogram for malignant neoplasm of breast: Secondary | ICD-10-CM | POA: Diagnosis not present

## 2023-03-06 DIAGNOSIS — K760 Fatty (change of) liver, not elsewhere classified: Secondary | ICD-10-CM | POA: Diagnosis not present

## 2023-03-06 DIAGNOSIS — K7460 Unspecified cirrhosis of liver: Secondary | ICD-10-CM | POA: Diagnosis not present

## 2023-03-06 DIAGNOSIS — J984 Other disorders of lung: Secondary | ICD-10-CM | POA: Diagnosis not present

## 2023-03-06 MED ORDER — IOPAMIDOL (ISOVUE-300) INJECTION 61%
100.0000 mL | Freq: Once | INTRAVENOUS | Status: AC | PRN
  Administered 2023-03-06: 100 mL via INTRAVENOUS

## 2023-03-08 NOTE — Progress Notes (Unsigned)
Danielle Parrish, female    DOB: 1941/09/01   MRN: [id number]   Brief patient profile:  49  yowf never smoker  referred to pulmonary Parrish 07/04/2022 by Dr Rexene Alberts  for cough p shoulder injury requiring GA and surgery in Aug of 2022.  [employer] shopping still able to do groceries leaning on cart / mb and back flat x 75 ft with walker   History of Present Illness  07/04/2022  Pulmonary/ 1st Parrish eval/Danielle Parrish on Macrodantin Chief Complaint  Patient presents with   Consult    Pt states she has some SOB and a runny nose x1 year. Pt states she has Oxygen and CPAP machine but she does not use the CPAP machine.   Dyspnea:  mb and back slowly = MMRC3 = can't walk 100 yards even at a slow pace at a flat grade s stopping due to sob   Cough: voice fatigue  Sleep: cpap/02  SABA use: minimal better    Rec You will need to get the echocardiogram asap - call this Parrish if can't schedule it within  a week  Stop macrodantin  Please schedule a follow up Parrish visit in 3- 4 weeks, sooner if needed  - Danielle Parrish  - bring all medications Late add: pt instructed on use of amb 02 = goal is to keep > 90% saturations at all times     08/22/2022  f/u ov/[place] Parrish/Danielle Parrish re: doe maint on ?  (Did not bring meds)   Chief Complaint  Patient presents with   Follow-up    Feels breathing is not doing well. Knees are bothering patient today.   Dyspnea:  walking mailbox and back but not checking sats despite "feeling breathing worse"  Cough: none but does have mild globus sensation on ACEi Sleeping: cpap/02 sleeping 0k SABA use: not really helping sob  02: not using x during the noct  Rec Stop lisinopril  and start olmesartan 20 mg on daily in its place to see if helps breathing Amlodipine may be making your swell up and need to reduce the dose if blood pressure too low  Make sure you check your oxygen saturation  AT  your highest level of activity (not after you stop)   to be sure it stays over  90%       11/14/2022  f/u ov/Danielle Parrish re: doe very mild, not long er cough off ACEi  Chief Complaint  Patient presents with   Follow-up    Breathing same since last ov.  Pt was very dizzy and had unsteady gait walking to room   Dyspnea:  limited more by balance than breathing / sats always low 90s even with max walking  Cough: sensation of globus/ also pnds  Sleeping: cpap and 02 per Danielle Parrish  SABA use:  only uses p exertion  02: just at hs  Rec Only use your albuterol as a rescue medication  Also  Ok to try albuterol 15 min before an activity (on alternating days)  that you know would usually make you short of breath   Dr Warrick Parisian may wish to add a very low dose of diuretic to your next  olmesartan  refill  Pulmonary follow up is as needed     02/06/2023  f/u ov/Danielle Parrish re: doe x 3 weeks assoc with mostly nasal congestion/ cough  S/p macrodantin rx 01/01/23  Chief Complaint  Patient presents with   Follow-up    Breathing about the same since last ov.  Patient wants an inogen machine  Dyspnea:  walks to mb 100 ft flat s 02  Cough: none now/ has improved Sleeping: cpap/ 02  level bed head raised on 3 pillows SABA use: occ / seem to helped  02: just at hs though has portable at home not using  Rec Prednisone 10 mg take  4 each am x 2 days,   2 each am x 2 days,  1 each am x 2 days and stop  Make sure you check your oxygen saturation  AT  your highest level of activity (not after you stop)   to be sure it stays over 90%  Please schedule a follow up Parrish visit in 4 weeks, sooner if needed  with all medications /inhalers/ solutions in hand so we can verify exactly what you are taking. This includes all medications from all doctors and over the counters     03/09/2023  f/u ov/Danielle Parrish re: doe/ AIAR  maint on prn saba    Chief Complaint  Patient presents with   Follow-up    Pt f/u she states that she is having difficulty walking, SOB and low oxygen sats    Dyspnea:  more limited by R hip than breathing/ mb and back on RA does not check 02 while walking there also doing steps 10 x daily stops at top but not using 02 then either or monitoring sats Cough: none  Sleeping: lost remote, bed flat / 2 pillows = baseline  SABA use: albuterol once a day 02: 2lpm and cpap fine     No obvious day to day or daytime variability or assoc excess/ purulent sputum or mucus plugs or hemoptysis or cp or chest tightness, subjective wheeze or overt sinus or hb symptoms.   Sleeping as above without nocturnal  or early am exacerbation  of respiratory  c/o's or need for noct saba. Also denies any obvious fluctuation of symptoms with weather or environmental changes or other aggravating or alleviating factors except as outlined above   No unusual exposure hx or h/o childhood pna/ asthma or knowledge of premature birth.  Current Allergies, Complete Past Medical History, Past Surgical History, Family History, and Social History were reviewed in [employer] record.  ROS  The following are not active complaints unless bolded Hoarseness, sore throat, dysphagia, dental problems, itching, sneezing,  nasal congestion or discharge of excess mucus or purulent secretions, ear ache,   fever, chills, sweats, unintended wt loss or wt gain, classically pleuritic or exertional cp,  orthopnea pnd or arm/hand swelling  or leg swelling, presyncope, palpitations, abdominal pain, anorexia, nausea, vomiting, diarrhea  or change in bowel habits or change in bladder habits, change in stools or change in urine, dysuria, hematuria,  rash, arthralgias, visual complaints, headache, numbness, weakness or ataxia or problems with walking or coordination,  change in mood or  memory.        Current Meds  Medication Sig   acetaminophen (TYLENOL) 500 MG tablet Take 1 tablet (500 mg total) by mouth every 6 (six) hours as needed.   albuterol (VENTOLIN HFA) 108 (90 Base) MCG/ACT inhaler  Take 2 puffs eveery 6 hours as needed for wheezing or shortness of breath   amLODipine (NORVASC) 10 MG tablet Take 1 tablet (10 mg total) by mouth daily.   azelastine (ASTELIN) 0.1 % nasal spray Place 1 spray into both nostrils 2 (two) times daily. Use in each nostril as directed   Cholecalciferol (VITAMIN D-3 PO) Take 1  capsule by mouth daily.    diphenhydrAMINE (BENADRYL) 25 MG tablet Take 50 mg by mouth at bedtime as needed for allergies.    Ferrous Sulfate (IRON) 325 (65 Fe) MG TABS Take 1 tablet by mouth 3 (three) times daily.   gabapentin (NEURONTIN) 300 MG capsule TAKE 1 CAPSULE BY MOUTH 3 TIMES DAILY   ketoconazole (NIZORAL) 2 % cream APPLY TO AFFECTED AREA EVERYDAY   levothyroxine (SYNTHROID) 100 MCG tablet TAKE 1 TABLET BY MOUTH EVERY DAY BEFORE BREAKFAST   lidocaine (LIDODERM) 5 % PLACE 1 PATCH ONTO THE SKIN DAILY. REMOVE & DISCARD PATCH WITHIN 12 HOURS OR AS DIRECTED BY MD   lovastatin (MEVACOR) 40 MG tablet TAKE 1 TABLET BY MOUTH EVERYDAY AT BEDTIME   metoprolol succinate (TOPROL-XL) 25 MG 24 hr tablet Take 1 tablet (25 mg total) by mouth daily.   Multiple Vitamins-Minerals (PRESERVISION AREDS 2 PO) Take by mouth.   nystatin (MYCOSTATIN/NYSTOP) powder APPLY TO AFFECTED AREA 4 TIMES A DAY   omeprazole (PRILOSEC) 20 MG capsule Take 1 capsule (20 mg total) by mouth 2 (two) times daily before a meal.   sertraline (ZOLOFT) 100 MG tablet TAKE 2 TABLETS BY MOUTH EVERY DAY   sodium chloride (OCEAN) 0.65 % SOLN nasal spray Place 1 spray into both nostrils as needed for congestion.              Past Medical History:  Diagnosis Date   Anxiety    Arthritis    CHF (congestive heart failure) (HCC)    Chronic pain of multiple joints    Depression    Essential hypertension    GERD (gastroesophageal reflux disease)    Hyperlipidemia    Hyperlipidemia associated with type 2 diabetes mellitus (HCC)    Hypertension associated with type 2 diabetes mellitus (HCC)    Hypothyroidism    Mild  scoliosis 10/19/2021   Osteoporosis    Sleep apnea    CPAP   Type 2 diabetes mellitus with hypercholesterolemia (HCC)    Vitamin D deficiency        Objective:   Wts    03/09/2023       180   02/06/2023       181   11/14/2022     179   08/22/22 173 lb 12.8 oz (78.8 kg)  08/02/22 172 lb (78 kg)  07/04/22 173 lb 3.2 oz (78.6 kg)    Vital signs reviewed  03/09/2023  - Note at rest 02 sats  87% on RA on arrival and bp up but pt not sure she took all her meds  General appearance:    amb  slt hoarse wf nad     HEENT : Oropharynx  clear         NECK :  without  apparent JVD/ palpable Nodes/TM    LUNGS: no acc muscle use,  Mod Kyphotic contour chest which is clear to A and P bilaterally without cough on insp or exp maneuvers   CV:  RRR  no s3  with 2-3 / 6 SEM  s  increase in P2, and no edema   ABD:  soft and nontender with nl inspiratory excursion in the supine position. No bruits or organomegaly appreciated   MS:  Nl gait/ ext warm without deformities Or obvious joint restrictions  calf tenderness, cyanosis or clubbing    SKIN: warm and dry without lesions    NEURO:  alert, approp, nl sensorium with  no motor or cerebellar deficits apparent.  Assessment

## 2023-03-09 ENCOUNTER — Other Ambulatory Visit: Payer: Self-pay | Admitting: *Deleted

## 2023-03-09 ENCOUNTER — Ambulatory Visit (INDEPENDENT_AMBULATORY_CARE_PROVIDER_SITE_OTHER): Payer: 59 | Admitting: Internal Medicine

## 2023-03-09 ENCOUNTER — Encounter: Payer: Self-pay | Admitting: Internal Medicine

## 2023-03-09 VITALS — BP 174/72 | HR 54 | Ht 62.0 in | Wt 180.6 lb

## 2023-03-09 DIAGNOSIS — R0609 Other forms of dyspnea: Secondary | ICD-10-CM

## 2023-03-09 DIAGNOSIS — K760 Fatty (change of) liver, not elsewhere classified: Secondary | ICD-10-CM

## 2023-03-09 DIAGNOSIS — J9611 Chronic respiratory failure with hypoxia: Secondary | ICD-10-CM | POA: Diagnosis not present

## 2023-03-09 NOTE — Patient Instructions (Signed)
Make sure you check your oxygen saturation  AT  your highest level of activity (not after you stop)   to be sure it stays over 90% and adjust  02 flow upward to maintain this level if needed but remember to turn it back to previous settings when you stop (to conserve your supply).   We need to work on keeping your blood pressure lower due to your valvular heart problem   Please schedule a follow up visit in 3 months but call sooner if needed

## 2023-03-10 ENCOUNTER — Other Ambulatory Visit: Payer: Self-pay | Admitting: Family Medicine

## 2023-03-10 DIAGNOSIS — R2243 Localized swelling, mass and lump, lower limb, bilateral: Secondary | ICD-10-CM

## 2023-03-10 DIAGNOSIS — E039 Hypothyroidism, unspecified: Secondary | ICD-10-CM

## 2023-03-10 NOTE — Assessment & Plan Note (Signed)
Desat  on ambulation 07/04/2022  > d/c'd macrodantin  - 07/04/2022   Walked on RA  x  2  lap(s) =  approx 500  ft  @ slow pace, stopped due to desats/sob with lowest 02 sats 80% > rec use portable 02 to maintain > 90% (already had it at time of ov but didn't bring it with her)    - 08/22/2022   Walked on RA  x  2  lap(s) =  approx 300  ft  @ slow/cane pace, stopped due to knee pain> sob  with lowest 02 sats 88%   - 11/14/2022 no longer desats walking as of  11/14/2022  - 02/06/2023 room air 150 ft walking > SOB with sats down to 84% - placed patient on 2LO2 cont and sats increased to 92% walking  - sats on RA 03/09/2023 on arrival > 03/09/2023   Walked on 2lpm  x  1  lap(s) =  approx 150  ft  @ slow pace, stopped due to sob  with lowest 02 sats 92%    Advised 2lpm/cpap hs and prn daytime to maintain > 90% at all times  F/u 3 m, sooner prn  Each maintenance medication was reviewed in detail including emphasizing most importantly the difference between maintenance and prns and under what circumstances the prns are to be triggered using an action plan format where appropriate.  Total time for H and P, chart review, counseling, reviewing hfa  device(s) , directly observing portions of ambulatory 02 saturation study/ and generating customized AVS unique to this office visit / same day charting = 30 min

## 2023-03-10 NOTE — Assessment & Plan Note (Addendum)
Onset Aug 2022  p GA for L shoulder surgery while on chronic macrodantin - macrodantin d/c 07/04/2022  - Echo [id number]  1 diastolic dysfunction  Mild AS / AI  - 08/22/2022 try off acei   - S/p macrodantin rx 01/01/23 > worse sats 02/06/2023 rec list as allergy   No evidence of macrodantin toxicity / fibrosis at this point with most of her issues restrictive from kyphosis/ obesity and possible element of diastolic dysfunction/ valvular heart dz so main rx form my perspective is approp saba and 02 prn   Re SABA :  I spent extra time with pt today reviewing appropriate use of albuterol for prn use on exertion with the following points: 1) saba is for relief of sob that does not improve by walking a slower pace or resting but rather if the pt does not improve after trying this first. 2) If the pt is convinced, as many are, that saba helps recover from activity faster then it's easy to tell if this is the case by re-challenging : ie stop, take the inhaler, then p 5 minutes try the exact same activity (intensity of workload) that just caused the symptoms and see if they are substantially diminished or not after saba 3) if there is an activity that reproducibly causes the symptoms, try the saba 15 min before the activity on alternate days   If in fact the saba really does help, then fine to continue to use it prn but advised may need to look closer at the maintenance regimen(for now = 0) being used to achieve better control of airways disease with exertion.

## 2023-03-12 ENCOUNTER — Telehealth: Payer: Self-pay | Admitting: Internal Medicine

## 2023-03-12 DIAGNOSIS — E1159 Type 2 diabetes mellitus with other circulatory complications: Secondary | ICD-10-CM

## 2023-03-12 DIAGNOSIS — R0609 Other forms of dyspnea: Secondary | ICD-10-CM

## 2023-03-12 DIAGNOSIS — J9611 Chronic respiratory failure with hypoxia: Secondary | ICD-10-CM

## 2023-03-12 NOTE — Telephone Encounter (Signed)
Pt called the office requesting to have Rx for POC sent to DME so she will have something that is much easier for her to carry around. Pt did mention Inogen POC.  Pt's DME is [facility]. States she uses 2L O2.  Dr. Melvyn Novas, please advise if you would be okay with [place] sending Rx for Inogen POC to [facility] for pt.

## 2023-03-12 NOTE — Telephone Encounter (Signed)
Fine with me but let her know she may have to qualify for it using their criteria, not ours

## 2023-03-13 NOTE — Telephone Encounter (Signed)
Called and spoke with pt letting her know the info per MW and she verbalized understanding. Order has been placed. Nothing further needed.

## 2023-03-18 DIAGNOSIS — R0902 Hypoxemia: Secondary | ICD-10-CM | POA: Diagnosis not present

## 2023-03-22 ENCOUNTER — Ambulatory Visit: Payer: 59

## 2023-03-26 DIAGNOSIS — M545 Low back pain, unspecified: Secondary | ICD-10-CM | POA: Diagnosis not present

## 2023-03-26 DIAGNOSIS — R1031 Right lower quadrant pain: Secondary | ICD-10-CM | POA: Diagnosis not present

## 2023-03-26 DIAGNOSIS — K5900 Constipation, unspecified: Secondary | ICD-10-CM | POA: Diagnosis not present

## 2023-03-29 ENCOUNTER — Encounter: Payer: Self-pay | Admitting: Family Medicine

## 2023-03-29 ENCOUNTER — Ambulatory Visit (INDEPENDENT_AMBULATORY_CARE_PROVIDER_SITE_OTHER): Payer: 59 | Admitting: Family Medicine

## 2023-03-29 VITALS — BP 155/67 | HR 67 | Ht 62.0 in | Wt 180.0 lb

## 2023-03-29 DIAGNOSIS — E7800 Pure hypercholesterolemia, unspecified: Secondary | ICD-10-CM | POA: Diagnosis not present

## 2023-03-29 DIAGNOSIS — E1169 Type 2 diabetes mellitus with other specified complication: Secondary | ICD-10-CM

## 2023-03-29 DIAGNOSIS — E039 Hypothyroidism, unspecified: Secondary | ICD-10-CM

## 2023-03-29 DIAGNOSIS — E1159 Type 2 diabetes mellitus with other circulatory complications: Secondary | ICD-10-CM | POA: Diagnosis not present

## 2023-03-29 DIAGNOSIS — I152 Hypertension secondary to endocrine disorders: Secondary | ICD-10-CM | POA: Diagnosis not present

## 2023-03-29 DIAGNOSIS — E785 Hyperlipidemia, unspecified: Secondary | ICD-10-CM | POA: Diagnosis not present

## 2023-03-29 LAB — BAYER DCA HB A1C WAIVED: HB A1C (BAYER DCA - WAIVED): 5.7 % — ABNORMAL HIGH (ref 4.8–5.6)

## 2023-03-29 NOTE — Progress Notes (Signed)
BP (!) 155/67   Pulse 67   Ht 5\' 2"  (1.575 m)   Wt 180 lb (81.6 kg)   SpO2 (!) 86%   BMI 32.92 kg/m    Subjective:   Patient ID: Danielle Parrish, female    DOB: 21-Jan-1941, 82 y.o.   MRN: [id number]  HPI: Danielle Parrish is a 82 y.o. female presenting on 03/29/2023 for Medical Management of Chronic Issues, Hyperlipidemia, Hypertension, and Diabetes   HPI Type 2 diabetes mellitus Patient comes in today for recheck of his diabetes. Patient has been currently taking no medicine, diet control. Patient is currently on an ACE inhibitor/ARB. Patient has not seen an ophthalmologist this year. Patient denies any new issues with their feet. The symptom started onset as an adult hypertension and hyperlipidemia and hypothyroidism and CHF ARE RELATED TO DM   Hypertension Patient is currently on amlodipine and metoprolol and olmesartan, and their blood pressure today is 155/67. Patient denies any lightheadedness or dizziness. Patient denies headaches, blurred vision, chest pains, shortness of breath, or weakness. Denies any side effects from medication and is content with current medication.   Hyperlipidemia Patient is coming in for recheck of his hyperlipidemia. The patient is currently taking lovastatin. They deny any issues with myalgias or history of liver damage from it. They deny any focal numbness or weakness or chest pain.   Hypothyroidism recheck Patient is coming in for thyroid recheck today as well. They deny any issues with hair changes or heat or cold problems or diarrhea or constipation. They deny any chest pain or palpitations. They are currently on levothyroxine 100 micrograms   Relevant past medical, surgical, family and social history reviewed and updated as indicated. Interim medical history since our last visit reviewed. Allergies and medications reviewed and updated.  Review of Systems  Constitutional:  Negative for chills and fever.  Eyes:  Negative for visual  disturbance.  Respiratory:  Negative for chest tightness and shortness of breath.   Cardiovascular:  Negative for chest pain and leg swelling.  Gastrointestinal:  Positive for abdominal pain and constipation.  Genitourinary:  Negative for difficulty urinating and dysuria.  Musculoskeletal:  Negative for back pain and gait problem.  Skin:  Negative for rash.  Neurological:  Negative for dizziness, light-headedness and headaches.  Psychiatric/Behavioral:  Negative for agitation and behavioral problems.   All other systems reviewed and are negative.   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    Penicillins Hives, Other (See Comments)   Has patient had a PCN reaction causing immediate rash, facial/tongue/throat swelling, SOB or lightheadedness with hypotension: Yes Has patient had a PCN reaction causing severe rash involving mucus membranes or skin necrosis: unknown Has patient had a PCN reaction that required hospitalization No Has patient had a PCN reaction occurring within the last 10 years: No If all of the above answers are "NO", then may proceed with Cephalosporin use.   Rosuvastatin Other (See Comments)   Body aches.   Statins Other (See Comments)   Vytorin [ezetimibe-simvastatin]    Body aches.    Aspirin Nausea Only   Tolmetin Nausea Only   Burns stomach.         Medication List        Accurate as of March 29, 2023 12:57 PM. If you  have any questions, ask your nurse or doctor.          acetaminophen 500 MG tablet Commonly known as: TYLENOL Take 1 tablet (500 mg total) by mouth every 6 (six) hours as needed.   albuterol 108 (90 Base) MCG/ACT inhaler Commonly known as: VENTOLIN HFA Take 2 puffs eveery 6 hours as needed for wheezing or shortness of breath   amLODipine 10 MG tablet Commonly known as:  NORVASC Take 1 tablet (10 mg total) by mouth daily.   azelastine 0.1 % nasal spray Commonly known as: ASTELIN Place 1 spray into both nostrils 2 (two) times daily. Use in each nostril as directed   calcium-vitamin D 500-200 MG-UNIT Tabs tablet Commonly known as: OSCAL WITH D TAKE 1 TABLET BY MOUTH EVERY DAY WITH BREAKFAST   cetirizine 10 MG tablet Commonly known as: ZYRTEC Take 1 tablet (10 mg total) by mouth daily.   cyclobenzaprine 5 MG tablet Commonly known as: FLEXERIL Take 1 tablet (5 mg total) by mouth 3 (three) times daily as needed for muscle spasms.   diclofenac Sodium 1 % Gel Commonly known as: VOLTAREN APPLY 4 GRAMS TOPICALLY 4 (FOUR) TIMES DAILY.   diphenhydrAMINE 25 MG tablet Commonly known as: BENADRYL Take 50 mg by mouth at bedtime as needed for allergies.   furosemide 20 MG tablet Commonly known as: LASIX TAKE 1 TABLET BY MOUTH EVERY DAY   gabapentin 300 MG capsule Commonly known as: NEURONTIN TAKE 1 CAPSULE BY MOUTH 3 TIMES DAILY   Iron 325 (65 Fe) MG Tabs Take 1 tablet by mouth 3 (three) times daily.   ketoconazole 2 % cream Commonly known as: NIZORAL APPLY TO AFFECTED AREA EVERYDAY   levothyroxine 100 MCG tablet Commonly known as: SYNTHROID TAKE 1 TABLET BY MOUTH EVERY DAY BEFORE BREAKFAST   lidocaine 5 % Commonly known as: LIDODERM PLACE 1 PATCH ONTO THE SKIN DAILY. REMOVE & DISCARD PATCH WITHIN 12 HOURS OR AS DIRECTED BY MD   linaclotide 145 MCG Caps capsule Commonly known as: LINZESS Take 145 mcg by mouth daily before breakfast.   lovastatin 40 MG tablet Commonly known as: MEVACOR TAKE 1 TABLET BY MOUTH EVERYDAY AT BEDTIME   metoprolol succinate 25 MG 24 hr tablet Commonly known as: TOPROL-XL Take 1 tablet (25 mg total) by mouth daily.   mirabegron ER 25 MG Tb24 tablet Commonly known as: Myrbetriq Take 1 tablet (25 mg total) by mouth daily.   nystatin powder Commonly known as: MYCOSTATIN/NYSTOP APPLY TO AFFECTED AREA 4 TIMES A  DAY   olmesartan 20 MG tablet Commonly known as: BENICAR TAKE 1 TABLET BY MOUTH EVERY DAY   omeprazole 20 MG capsule Commonly known as: PRILOSEC Take 1 capsule (20 mg total) by mouth 2 (two) times daily before a meal.   polyethylene glycol powder 17 GM/SCOOP powder Commonly known as: GLYCOLAX/MIRALAX Take 17 g by mouth daily.   PRESERVISION AREDS 2 PO Take by mouth.   pyridOXINE 100 MG tablet Commonly known as: VITAMIN B6 Take 100 mg by mouth daily.   sertraline 100 MG tablet Commonly known as: ZOLOFT TAKE 2 TABLETS BY MOUTH EVERY DAY   sodium chloride 0.65 % Soln nasal spray Commonly known as: OCEAN Place 1 spray into both nostrils as needed for congestion.   VITAMIN D-3 PO Take 1 capsule by mouth daily.         Objective:   BP (!) 155/67   Pulse 67   Ht 5\' 2"  (1.575 m)   Wt 180 lb (  81.6 kg)   SpO2 (!) 86%   BMI 32.92 kg/m   Wt Readings from Last 3 Encounters:  03/29/23 180 lb (81.6 kg)  03/09/23 180 lb 9.6 oz (81.9 kg)  02/28/23 180 lb (81.6 kg)    Physical Exam Vitals and nursing note reviewed.  Constitutional:      General: She is not in acute distress.    Appearance: She is well-developed. She is not diaphoretic.  Eyes:     Conjunctiva/sclera: Conjunctivae normal.  Cardiovascular:     Rate and Rhythm: Normal rate and regular rhythm.     Heart sounds: Normal heart sounds. No murmur heard. Pulmonary:     Effort: Pulmonary effort is normal. No respiratory distress.     Breath sounds: Normal breath sounds. No wheezing.  Abdominal:     General: Abdomen is flat. Bowel sounds are normal. There is no distension.     Tenderness: There is abdominal tenderness (Generalized abdominal pain, seen gastroenterology for this). There is no guarding or rebound.     Hernia: No hernia is present.  Musculoskeletal:        General: No tenderness. Normal range of motion.  Skin:    General: Skin is warm and dry.     Findings: No rash.  Neurological:     Mental  Status: She is alert and oriented to person, place, and time.     Coordination: Coordination normal.  Psychiatric:        Behavior: Behavior normal.       Assessment & Plan:   Problem List Items Addressed This Visit       Cardiovascular and Mediastinum   Hypertension associated with type 2 diabetes mellitus   Relevant Orders   CBC with Differential/Platelet   Lipid panel   Bayer DCA Hb A1c Waived (Completed)     Endocrine   Hyperlipidemia associated with type 2 diabetes mellitus - Primary   Relevant Orders   CBC with Differential/Platelet   Lipid panel   Bayer DCA Hb A1c Waived (Completed)   Type 2 diabetes mellitus with hypercholesterolemia   Hypothyroidism    A1c 5.7.  Blood pressure is slightly elevated but she is having a lot of stomach issues and pains for which she just started Linzess this week and she is going to see if that helps and maybe the blood pressure comes down but she will keep a close eye on it over the next few weeks. Follow up plan: Return in about 3 months (around 06/28/2023), or if symptoms worsen or fail to improve, for Diabetes and hypertension and cholesterol.  Counseling provided for all of the vaccine components Orders Placed This Encounter  Procedures   CBC with Differential/Platelet   Lipid panel   Bayer DCA Hb A1c [place], MD [facility] 03/29/2023, 12:57 PM

## 2023-03-30 LAB — LIPID PANEL
Chol/HDL Ratio: 2.6 ratio (ref 0.0–4.4)
Cholesterol, Total: 161 mg/dL (ref 100–199)
HDL: 62 mg/dL (ref >39)
LDL Chol Calc (NIH): 73 mg/dL (ref 0–99)
Triglycerides: 156 mg/dL — ABNORMAL HIGH (ref 0–149)
VLDL Cholesterol Cal: 26 mg/dL (ref 5–40)

## 2023-03-30 LAB — CBC WITH DIFFERENTIAL/PLATELET
Basophils Absolute: 0 10*3/uL (ref 0.0–0.2)
Basos: 1 %
EOS (ABSOLUTE): 0.1 10*3/uL (ref 0.0–0.4)
Eos: 1 %
Hematocrit: 42.8 % (ref 34.0–46.6)
Hemoglobin: 14.1 g/dL (ref 11.1–15.9)
Immature Grans (Abs): 0 10*3/uL (ref 0.0–0.1)
Immature Granulocytes: 0 %
Lymphocytes Absolute: 1.9 10*3/uL (ref 0.7–3.1)
Lymphs: 42 %
MCH: 31.8 pg (ref 26.6–33.0)
MCHC: 32.9 g/dL (ref 31.5–35.7)
MCV: 96 fL (ref 79–97)
Monocytes Absolute: 0.4 10*3/uL (ref 0.1–0.9)
Monocytes: 10 %
Neutrophils Absolute: 2 10*3/uL (ref 1.4–7.0)
Neutrophils: 46 %
Platelets: 171 10*3/uL (ref 150–450)
RBC: 4.44 x10E6/uL (ref 3.77–5.28)
RDW: 12.3 % (ref 11.7–15.4)
WBC: 4.4 10*3/uL (ref 3.4–10.8)

## 2023-04-13 ENCOUNTER — Other Ambulatory Visit: Payer: Self-pay | Admitting: Family Medicine

## 2023-04-13 DIAGNOSIS — R2243 Localized swelling, mass and lump, lower limb, bilateral: Secondary | ICD-10-CM

## 2023-04-18 DIAGNOSIS — R0902 Hypoxemia: Secondary | ICD-10-CM | POA: Diagnosis not present

## 2023-04-30 ENCOUNTER — Telehealth: Payer: Self-pay | Admitting: *Deleted

## 2023-04-30 NOTE — Telephone Encounter (Signed)
Called and spoke w/ pt she verbalized that the "imaging center" needed [place] to fill out a form. I asked pt what type of scan/imaging she was having done & she verbalized that it was for "equipment".  I asked pt if she was talking about the previous encounter on 3/18 w/ the POC and she verbalized that she was. Instructed pt that she would need to contact [facility] because the order has already been placed.   NFN att.

## 2023-04-30 NOTE — Telephone Encounter (Signed)
Patient would like to talk to [facility] regarding results but was not sure what was needed. Please call and advise [phone]

## 2023-05-07 DIAGNOSIS — M545 Low back pain, unspecified: Secondary | ICD-10-CM | POA: Diagnosis not present

## 2023-05-07 DIAGNOSIS — K5900 Constipation, unspecified: Secondary | ICD-10-CM | POA: Diagnosis not present

## 2023-05-07 DIAGNOSIS — R1031 Right lower quadrant pain: Secondary | ICD-10-CM | POA: Diagnosis not present

## 2023-05-11 DIAGNOSIS — R0609 Other forms of dyspnea: Secondary | ICD-10-CM | POA: Diagnosis not present

## 2023-05-11 DIAGNOSIS — J9611 Chronic respiratory failure with hypoxia: Secondary | ICD-10-CM | POA: Diagnosis not present

## 2023-05-18 DIAGNOSIS — R0902 Hypoxemia: Secondary | ICD-10-CM | POA: Diagnosis not present

## 2023-06-05 ENCOUNTER — Telehealth: Payer: Self-pay | Admitting: Family Medicine

## 2023-06-05 DIAGNOSIS — M25562 Pain in left knee: Secondary | ICD-10-CM | POA: Diagnosis not present

## 2023-06-05 DIAGNOSIS — M25561 Pain in right knee: Secondary | ICD-10-CM | POA: Diagnosis not present

## 2023-06-05 DIAGNOSIS — M5136 Other intervertebral disc degeneration, lumbar region: Secondary | ICD-10-CM

## 2023-06-05 NOTE — Telephone Encounter (Signed)
Pt needs new referral to [facility] PT. The one she had in Feb expired.

## 2023-06-05 NOTE — Telephone Encounter (Signed)
Please advise 

## 2023-06-06 ENCOUNTER — Telehealth: Payer: Self-pay | Admitting: Family Medicine

## 2023-06-06 ENCOUNTER — Other Ambulatory Visit: Payer: Self-pay | Admitting: Family Medicine

## 2023-06-06 DIAGNOSIS — E1159 Type 2 diabetes mellitus with other circulatory complications: Secondary | ICD-10-CM

## 2023-06-06 DIAGNOSIS — B372 Candidiasis of skin and nail: Secondary | ICD-10-CM

## 2023-06-06 DIAGNOSIS — F339 Major depressive disorder, recurrent, unspecified: Secondary | ICD-10-CM

## 2023-06-06 MED ORDER — GABAPENTIN 300 MG PO CAPS: 300.0000 mg | ORAL_CAPSULE | Freq: Three times a day (TID) | ORAL | 0 refills | Status: DC

## 2023-06-06 NOTE — Telephone Encounter (Signed)
Pt made aware of refill and to keep appt in July.

## 2023-06-06 NOTE — Telephone Encounter (Signed)
Go ahead and give her enough gabapentin to get to the appointment.

## 2023-06-06 NOTE — Telephone Encounter (Signed)
Referral placed to [facility]. Pt made aware.

## 2023-06-06 NOTE — Telephone Encounter (Signed)
Go ahead and place a renewed GRIPTION to physical therapy for the patient, diagnoses degenerative disc disease lumbar weakness

## 2023-06-06 NOTE — Telephone Encounter (Signed)
  Prescription Request  06/06/2023  Is this a "Controlled Substance" medicine?   Have you seen your PCP in the last 2 weeks? LOV 03/29/23 NOV 06/29/23  If YES, route message to pool  -  If NO, patient needs to be scheduled for appointment.  What is the name of the medication or equipment? gabapentin (NEURONTIN) 300 MG capsule  Have you contacted your pharmacy to request a refill? yes   Which pharmacy would you like this sent to?  [id number] - [place], [place] - [address]       Patient notified that their request is being sent to the clinical staff for review and that they should receive a response within 2 business days.

## 2023-06-08 ENCOUNTER — Other Ambulatory Visit: Payer: Self-pay | Admitting: Family Medicine

## 2023-06-11 ENCOUNTER — Encounter (HOSPITAL_COMMUNITY): Payer: Self-pay | Admitting: Emergency Medicine

## 2023-06-11 ENCOUNTER — Emergency Department (HOSPITAL_COMMUNITY)
Admission: EM | Admit: 2023-06-11 | Discharge: 2023-06-12 | Disposition: A | Discharge status: Home / Self Care | Payer: 59 | Attending: Emergency Medicine | Admitting: Emergency Medicine

## 2023-06-11 ENCOUNTER — Other Ambulatory Visit: Payer: Self-pay

## 2023-06-11 ENCOUNTER — Emergency Department (HOSPITAL_COMMUNITY): Payer: 59

## 2023-06-11 ENCOUNTER — Encounter: Payer: Self-pay | Admitting: Internal Medicine

## 2023-06-11 ENCOUNTER — Ambulatory Visit (INDEPENDENT_AMBULATORY_CARE_PROVIDER_SITE_OTHER): Payer: 59 | Admitting: Internal Medicine

## 2023-06-11 VITALS — BP 119/81 | HR 105 | Ht 62.0 in | Wt 181.2 lb

## 2023-06-11 DIAGNOSIS — R0609 Other forms of dyspnea: Secondary | ICD-10-CM | POA: Diagnosis not present

## 2023-06-11 DIAGNOSIS — R002 Palpitations: Secondary | ICD-10-CM

## 2023-06-11 DIAGNOSIS — Z7901 Long term (current) use of anticoagulants: Secondary | ICD-10-CM | POA: Diagnosis not present

## 2023-06-11 DIAGNOSIS — I480 Paroxysmal atrial fibrillation: Secondary | ICD-10-CM

## 2023-06-11 DIAGNOSIS — J9611 Chronic respiratory failure with hypoxia: Secondary | ICD-10-CM

## 2023-06-11 DIAGNOSIS — R0602 Shortness of breath: Secondary | ICD-10-CM | POA: Diagnosis not present

## 2023-06-11 DIAGNOSIS — R000 Tachycardia, unspecified: Secondary | ICD-10-CM

## 2023-06-11 DIAGNOSIS — R531 Weakness: Secondary | ICD-10-CM | POA: Diagnosis not present

## 2023-06-11 LAB — COMPREHENSIVE METABOLIC PANEL
ALT: 27 U/L (ref 0–44)
AST: 16 U/L (ref 15–41)
Albumin: 4.1 g/dL (ref 3.5–5.0)
Alkaline Phosphatase: 70 U/L (ref 38–126)
Anion gap: 8 (ref 5–15)
BUN: 22 mg/dL (ref 8–23)
CO2: 28 mmol/L (ref 22–32)
Calcium: 10.6 mg/dL — ABNORMAL HIGH (ref 8.9–10.3)
Chloride: 98 mmol/L (ref 98–111)
Creatinine, Ser: 0.73 mg/dL (ref 0.44–1.00)
GFR, Estimated: >60 mL/min (ref >60)
Glucose, Bld: 102 mg/dL — ABNORMAL HIGH (ref 70–99)
Potassium: 4.2 mmol/L (ref 3.5–5.1)
Sodium: 134 mmol/L — ABNORMAL LOW (ref 135–145)
Total Bilirubin: 0.7 mg/dL (ref 0.3–1.2)
Total Protein: 6.9 g/dL (ref 6.5–8.1)

## 2023-06-11 LAB — CBC
HCT: 45.6 % (ref 36.0–46.0)
Hemoglobin: 14.7 g/dL (ref 12.0–15.0)
MCH: 32.1 pg (ref 26.0–34.0)
MCHC: 32.2 g/dL (ref 30.0–36.0)
MCV: 99.6 fL (ref 80.0–100.0)
Platelets: 202 10*3/uL (ref 150–400)
RBC: 4.58 MIL/uL (ref 3.87–5.11)
RDW: 11.8 % (ref 11.5–15.5)
WBC: 7.4 10*3/uL (ref 4.0–10.5)
nRBC: 0 % (ref 0.0–0.2)

## 2023-06-11 LAB — D-DIMER, QUANTITATIVE: D-Dimer, Quant: <0.27 ug/mL-FEU (ref 0.00–0.50)

## 2023-06-11 LAB — TROPONIN I (HIGH SENSITIVITY): Troponin I (High Sensitivity): 13 ng/L (ref <18)

## 2023-06-11 LAB — PROTIME-INR
INR: 0.9 (ref 0.8–1.2)
Prothrombin Time: 12.8 seconds (ref 11.4–15.2)

## 2023-06-11 MED ORDER — APIXABAN 5 MG PO TABS: 5.0000 mg | ORAL_TABLET | Freq: Two times a day (BID) | ORAL | 0 refills | Status: DC

## 2023-06-11 MED ORDER — APIXABAN 5 MG PO TABS
5.0000 mg | ORAL_TABLET | Freq: Two times a day (BID) | ORAL | Status: DC
  Administered 2023-06-11: 5 mg via ORAL
  Filled 2023-06-11: qty 1

## 2023-06-11 NOTE — Assessment & Plan Note (Signed)
Onset Aug 2022  p GA for L shoulder surgery while on chronic macrodantin - macrodantin d/c 07/04/2022  - Echo 07/11/2022  1 diastolic dysfunction  Mild AS / AI  - 08/22/2022 try off acei   - S/p macrodantin rx 01/01/23 > worse sats 02/06/2023 rec list as allergy  - 06/11/2023 exac x ? 2 weeks esp x 24 h  > RAF new > to ER   Looks like combination of restriction from kyphosis plus CHF/ rapid afib > will re-eval post er

## 2023-06-11 NOTE — Patient Instructions (Signed)
You have rapid atrial fibrillation and need to go to ER now

## 2023-06-11 NOTE — ED Provider Triage Note (Signed)
Emergency Medicine Provider Triage Evaluation Note  Jaynell Vecchiarelli , a 82 y.o. female  was evaluated in triage.  Pt complains of shortness of breath.  Couple weeks ago.  Denies associated chest pain or palpitations.  Worse with exertion.  Denies recent weight gain or lower extremity edema.  Was seen by her PCP today with concern for new onset A-fib.  This prompted her evaluation today in the ED.  Uses oxygen intermittently at home.  Denies cough congestion fever.  Denies calf tenderness.  Review of Systems  Positive: See above Negative: See above  Physical Exam  BP (!) 144/110   Pulse (!) 115   Temp 98.3 F (36.8 C)   Resp 20   Ht 5\' 2"  (1.575 m)   Wt 82.1 kg   SpO2 100%   BMI 33.11 kg/m  Gen:   Awake, no distress   Resp:  Normal effort  MSK:   Moves extremities without difficulty  Other:    Medical Decision Making  Medically screening exam initiated at 4:29 PM.  Appropriate orders placed.  Shilynn Rascher Harwick was informed that the remainder of the evaluation will be completed by another provider, this initial triage assessment does not replace that evaluation, and the importance of remaining in the ED until their evaluation is complete.  Work up started   Gareth Eagle, PA-C 06/11/23 1630

## 2023-06-11 NOTE — Discharge Instructions (Addendum)
Please call your cardiologist and be seen this week for new onset paroxysmal afib. If they cannot see you, call the phone number provided for our Afib clinic in [place]. They will get you in this week. Continue taking Metoprolol. Start taking Eliquis-blood thinner.

## 2023-06-11 NOTE — Assessment & Plan Note (Signed)
Desat  on ambulation 07/04/2022  > d/c'd macrodantin  - 07/04/2022   Walked on RA  x  2  lap(s) =  approx 500  ft  @ slow pace, stopped due to desats/sob with lowest 02 sats 80% > rec use portable 02 to maintain > 90% (already had it at time of ov but didn't bring it with her)    - 08/22/2022   Walked on RA  x  2  lap(s) =  approx 300  ft  @ slow/cane pace, stopped due to knee pain> sob  with lowest 02 sats 88%   - 11/14/2022 no longer desats walking as of  11/14/2022  - 02/06/2023 room air 150 ft walking > SOB with sats down to 84% - placed patient on 2LO2 cont and sats increased to 92% walking  - sats on RA 03/09/2023 on arrival > 03/09/2023   Walked on 2lpm  x  1  lap(s) =  approx 150  ft  @ slow pace, stopped due to sob  with lowest 02 sats 92%     Well compensated despite RAF          Each maintenance medication was reviewed in detail including emphasizing most importantly the difference between maintenance and prns and under what circumstances the prns are to be triggered using an action plan format where appropriate.  Total time for H and P, chart review, counseling, reviewing 02  device(s) and generating customized AVS unique to this office visit / same day charting = 31 min

## 2023-06-11 NOTE — ED Triage Notes (Signed)
Pt via POV c/o palpitations and SOB, sent by PCP. New dx a fib today. Pt wears 3L O2 at night but today has needed supplemental oxygen at all times. Currently on 4L nasal cannula and seems visibly SOB in triage.

## 2023-06-11 NOTE — Progress Notes (Signed)
Danielle Parrish, female    DOB: 1941/09/01   MRN: [id number]   Brief patient profile:  49  yowf never smoker  referred to pulmonary clinic 07/04/2022 by Dr Rexene Alberts  for cough p shoulder injury requiring GA and surgery in Aug of 2022.  [employer] still able to do groceries leaning on cart / mb and back flat x 75 ft with walker   History of Present Illness  07/04/2022  Pulmonary/ 1st office eval/Nuri Branca on Macrodantin Chief Complaint  Patient presents with   Consult    Pt states she has some SOB and a runny nose x1 year. Pt states she has Oxygen and CPAP machine but she does not use the CPAP machine.   Dyspnea:  mb and back slowly = MMRC3 = can't walk 100 yards even at a slow pace at a flat grade s stopping due to sob   Cough: voice fatigue  Sleep: cpap/02  SABA use: minimal better    Rec You will need to get the echocardiogram asap - call this office if can't schedule it within  a week  Stop macrodantin  Please schedule a follow up office visit in 3- 4 weeks, sooner if needed  - [facility]  - bring all medications Late add: pt instructed on use of amb 02 = goal is to keep > 90% saturations at all times     08/22/2022  f/u ov/[place] office/Infant Zink re: doe maint on ?  (Did not bring meds)   Chief Complaint  Patient presents with   Follow-up    Feels breathing is not doing well. Knees are bothering patient today.   Dyspnea:  walking mailbox and back but not checking sats despite "feeling breathing worse"  Cough: none but does have mild globus sensation on ACEi Sleeping: cpap/02 sleeping 0k SABA use: not really helping sob  02: not using x during the noct  Rec Stop lisinopril  and start olmesartan 20 mg on daily in its place to see if helps breathing Amlodipine may be making your swell up and need to reduce the dose if blood pressure too low  Make sure you check your oxygen saturation  AT  your highest level of activity (not after you stop)   to be sure it stays over  90%       11/14/2022  f/u ov/Jediah Horger re: doe very mild, not long er cough off ACEi  Chief Complaint  Patient presents with   Follow-up    Breathing same since last ov.  Pt was very dizzy and had unsteady gait walking to room   Dyspnea:  limited more by balance than breathing / sats always low 90s even with max walking  Cough: sensation of globus/ also pnds  Sleeping: cpap and 02 per [place]  SABA use:  only uses p exertion  02: just at hs  Rec Only use your albuterol as a rescue medication  Also  Ok to try albuterol 15 min before an activity (on alternating days)  that you know would usually make you short of breath   Dr Warrick Parisian may wish to add a very low dose of diuretic to your next  olmesartan  refill  Pulmonary follow up is as needed     02/06/2023  f/u ov/[place] office/Aliegha Paullin re: doe x 3 weeks assoc with mostly nasal congestion/ cough  S/p macrodantin rx 01/01/23  Chief Complaint  Patient presents with   Follow-up    Breathing about the same since last ov.  Patient wants an inogen machine  Dyspnea:  walks to mb 100 ft flat s 02  Cough: none now/ has improved Sleeping: cpap/ 02  level bed head raised on 3 pillows SABA use: occ / seem to helped  02: just at hs though has portable at home not using  Rec Prednisone 10 mg take  4 each am x 2 days,   2 each am x 2 days,  1 each am x 2 days and stop  Make sure you check your oxygen saturation  AT  your highest level of activity (not after you stop)   to be sure it stays over 90%  Please schedule a follow up office visit in 4 weeks, sooner if needed  with all medications /inhalers/ solutions in hand so we can verify exactly what you are taking. This includes all medications from all doctors and over the counters     03/09/2023  f/u ov/[place] office/Jakaylah Schlafer re: doe/ AIAR  maint on prn saba    Chief Complaint  Patient presents with   Follow-up    Pt f/u she states that she is having difficulty walking, SOB and low oxygen sats   Dyspnea:  more limited by R hip than breathing/ mb and back on RA does not check 02 while walking there also doing steps 10 x daily stops at top but not using 02 then either or monitoring sats Cough: none  Sleeping: lost remote, bed flat / 2 pillows = baseline  SABA use: albuterol once a day 02: 2lpm and cpap fine  Rec Make sure you check your oxygen saturation  AT  your highest level of activity (not after you stop)   to be sure it stays over 90%   We need to work on keeping your blood pressure lower due to your valvular heart problem   06/11/2023  f/u ov/ office/Murial Beam re: doe/ valvular ht dz maint on 02   Chief Complaint  Patient presents with   Follow-up    Pt f/u states that her breathing is worse today which she wonders if the humidity is making it worse.   Dyspnea:  starting PT in [place] Cough: none  Sleeping: flat and wedge and does fine  SABA use: not helping  02: 3lpm at hs 24/ 7      No obvious day to day or daytime variability or assoc excess/ purulent sputum or mucus plugs or hemoptysis or cp or chest tightness, subjective wheeze or overt sinus or hb symptoms.   Sleeping  without nocturnal  or early am exacerbation  of respiratory  c/o's or need for noct saba. Also denies any obvious fluctuation of symptoms with weather or environmental changes or other aggravating or alleviating factors except as outlined above   No unusual exposure hx or h/o childhood pna/ asthma or knowledge of premature birth.  Current Allergies, Complete Past Medical History, Past Surgical History, Family History, and Social History were reviewed in [employer] record.  ROS  The following are not active complaints unless bolded Hoarseness, sore throat, dysphagia, dental problems, itching, sneezing,  nasal congestion or discharge of excess mucus or purulent secretions, ear ache,   fever, chills, sweats, unintended wt loss or wt gain, classically pleuritic or exertional  cp,  orthopnea pnd or arm/hand swelling  or leg swelling, presyncope, palpitations, abdominal pain, anorexia, nausea, vomiting, diarrhea  or change in bowel habits or change in bladder habits, change in stools or change in urine, dysuria, hematuria,  rash, arthralgias,  visual complaints, headache, numbness, weakness or ataxia or problems with walking or coordination,  change in mood or  memory.        Current Meds  Medication Sig   acetaminophen (TYLENOL) 500 MG tablet Take 1 tablet (500 mg total) by mouth every 6 (six) hours as needed.   albuterol (VENTOLIN HFA) 108 (90 Base) MCG/ACT inhaler Take 2 puffs eveery 6 hours as needed for wheezing or shortness of breath   amLODipine (NORVASC) 10 MG tablet Take 1 tablet (10 mg total) by mouth daily.   azelastine (ASTELIN) 0.1 % nasal spray Place 1 spray into both nostrils 2 (two) times daily. Use in each nostril as directed   calcium-vitamin D (OSCAL WITH D) 500-200 MG-UNIT TABS tablet TAKE 1 TABLET BY MOUTH EVERY DAY WITH BREAKFAST   cetirizine (ZYRTEC) 10 MG tablet Take 1 tablet (10 mg total) by mouth daily.   Cholecalciferol (VITAMIN D-3 PO) Take 1 capsule by mouth daily.    cyclobenzaprine (FLEXERIL) 5 MG tablet Take 1 tablet (5 mg total) by mouth 3 (three) times daily as needed for muscle spasms.   diclofenac Sodium (VOLTAREN) 1 % GEL APPLY 4 GRAMS TOPICALLY 4 (FOUR) TIMES DAILY.   diphenhydrAMINE (BENADRYL) 25 MG tablet Take 50 mg by mouth at bedtime as needed for allergies.    Ferrous Sulfate (IRON) 325 (65 Fe) MG TABS Take 1 tablet by mouth 3 (three) times daily.   furosemide (LASIX) 20 MG tablet TAKE 1 TABLET BY MOUTH EVERY DAY   gabapentin (NEURONTIN) 300 MG capsule Take 1 capsule (300 mg total) by mouth 3 (three) times daily.   ketoconazole (NIZORAL) 2 % cream APPLY TO AFFECTED AREA EVERYDAY   levothyroxine (SYNTHROID) 100 MCG tablet TAKE 1 TABLET BY MOUTH EVERY DAY BEFORE BREAKFAST   lidocaine (LIDODERM) 5 % PLACE 1 PATCH ONTO THE SKIN  DAILY. REMOVE & DISCARD PATCH WITHIN 12 HOURS OR AS DIRECTED BY MD   lovastatin (MEVACOR) 40 MG tablet TAKE 1 TABLET BY MOUTH EVERYDAY AT BEDTIME   metoprolol succinate (TOPROL-XL) 25 MG 24 hr tablet TAKE 1 TABLET (25 MG TOTAL) BY MOUTH DAILY.   mirabegron ER (MYRBETRIQ) 25 MG TB24 tablet Take 1 tablet (25 mg total) by mouth daily.   Multiple Vitamins-Minerals (PRESERVISION AREDS 2 PO) Take by mouth.   nystatin (MYCOSTATIN/NYSTOP) powder APPLY TO AFFECTED AREA 4 TIMES A DAY   olmesartan (BENICAR) 20 MG tablet TAKE 1 TABLET BY MOUTH EVERY DAY   omeprazole (PRILOSEC) 20 MG capsule Take 1 capsule (20 mg total) by mouth 2 (two) times daily before a meal.   polyethylene glycol powder (GLYCOLAX/MIRALAX) 17 GM/SCOOP powder Take 17 g by mouth daily.   pyridOXINE (VITAMIN B-6) 100 MG tablet Take 100 mg by mouth daily.   sertraline (ZOLOFT) 100 MG tablet TAKE 2 TABLETS BY MOUTH EVERY DAY   sodium chloride (OCEAN) 0.65 % SOLN nasal spray Place 1 spray into both nostrils as needed for congestion.                 Past Medical History:  Diagnosis Date   Anxiety    Arthritis    CHF (congestive heart failure) (HCC)    Chronic pain of multiple joints    Depression    Essential hypertension    GERD (gastroesophageal reflux disease)    Hyperlipidemia    Hyperlipidemia associated with type 2 diabetes mellitus (HCC)    Hypertension associated with type 2 diabetes mellitus (HCC)    Hypothyroidism  Mild scoliosis 10/19/2021   Osteoporosis    Sleep apnea    CPAP   Type 2 diabetes mellitus with hypercholesterolemia (HCC)    Vitamin D deficiency        Objective:   Wts  06/11/2023       181  03/09/2023       180   02/06/2023       181   11/14/2022     179   08/22/22 173 lb 12.8 oz (78.8 kg)  08/02/22 172 lb (78 kg)  07/04/22 173 lb 3.2 oz (78.6 kg)    Vital signs reviewed  06/11/2023  - Note at rest 02 sats  93% on 4lpm POC    General appearance:    mod obese amb wf / easily confused with  details of care      HEENT : Oropharynx  clear          NECK :  without  apparent JVD/ palpable Nodes/TM    LUNGS: no acc muscle use,  Nl contour chest which is clear to A and P bilaterally without cough on insp or exp maneuvers   CV: IRIR around 120 apical pulse  no s3 or murmur or increase in P2, and no edema   ABD:  soft and nontender with nl inspiratory excursion in the supine position. No bruits or organomegaly appreciated   MS:  Nl gait/ ext warm without deformities Or obvious joint restrictions  calf tenderness, cyanosis or clubbing    SKIN: warm and dry without lesions    NEURO:  alert, approp, nl sensorium with  no motor or cerebellar deficits apparent.     EKG  afib rate 125 no ischemic changes   CXR PA and Lateral:   06/11/2023 :    I personally reviewed images and agree with radiology impression as follows:   Cardiomegaly. There are no signs of pulmonary edema or new focal pulmonary consolidation. My review:  marked T kyphosis         Assessment

## 2023-06-11 NOTE — ED Provider Notes (Signed)
[place] EMERGENCY DEPARTMENT AT [facility] Provider Note   CSN: [id number] Arrival date & time: 06/11/23  1519     History {Add pertinent medical, surgical, social history, OB history to HPI:1} Chief Complaint  Patient presents with   Palpitations   Shortness of Breath    Danielle Parrish is a 82 y.o. female.  Patient is a 82 yo female presenting for sob and palpitations. Pt states symptoms have been present for weeks. She admits to significant generalized weakness and fatigue. She denies fevers, chills, or coughing. She denies chest pain. Denies hx of DVT or PE. Denies lower extremity swelling this week.  Was seen at pulmonologist office, Dr. Sherene Sires, today and was told to go to ED due to pulse of 120 and concerns for afib. Hx of osa with cpap. Wears nightly oxygen but has been wearing during the day this week due to symptoms. Patient is a never smoker.   The history is provided by the patient. No language interpreter was used.  Palpitations Associated symptoms: shortness of breath   Associated symptoms: no back pain, no chest pain, no cough and no vomiting   Shortness of Breath Associated symptoms: no abdominal pain, no chest pain, no cough, no ear pain, no fever, no rash, no sore throat and no vomiting        Home Medications Prior to Admission medications   Medication Sig Start Date End Date Taking? Authorizing Provider  acetaminophen (TYLENOL) 500 MG tablet Take 1 tablet (500 mg total) by mouth every 6 (six) hours as needed. 05/14/20   Daryll Drown, NP  albuterol (VENTOLIN HFA) 108 (90 Base) MCG/ACT inhaler Take 2 puffs eveery 6 hours as needed for wheezing or shortness of breath 06/19/22   Dettinger, Elige Radon, MD  amLODipine (NORVASC) 10 MG tablet Take 1 tablet (10 mg total) by mouth daily. 06/19/22   Dettinger, Elige Radon, MD  azelastine (ASTELIN) 0.1 % nasal spray Place 1 spray into both nostrils 2 (two) times daily. Use in each nostril as directed 06/21/22    Dettinger, Elige Radon, MD  calcium-vitamin D (OSCAL WITH D) 500-200 MG-UNIT TABS tablet TAKE 1 TABLET BY MOUTH EVERY DAY WITH BREAKFAST 09/06/20   Dettinger, Elige Radon, MD  cetirizine (ZYRTEC) 10 MG tablet Take 1 tablet (10 mg total) by mouth daily. 10/05/22   Jannifer Rodney A, FNP  Cholecalciferol (VITAMIN D-3 PO) Take 1 capsule by mouth daily.     [provider]  cyclobenzaprine (FLEXERIL) 5 MG tablet Take 1 tablet (5 mg total) by mouth 3 (three) times daily as needed for muscle spasms. 05/14/20   Daryll Drown, NP  diclofenac Sodium (VOLTAREN) 1 % GEL APPLY 4 GRAMS TOPICALLY 4 (FOUR) TIMES DAILY. 04/04/21   Dettinger, Elige Radon, MD  diphenhydrAMINE (BENADRYL) 25 MG tablet Take 50 mg by mouth at bedtime as needed for allergies.     [provider]  Ferrous Sulfate (IRON) 325 (65 Fe) MG TABS Take 1 tablet by mouth 3 (three) times daily. 08/29/21   [provider]  furosemide (LASIX) 20 MG tablet TAKE 1 TABLET BY MOUTH EVERY DAY 04/16/23   Dettinger, Elige Radon, MD  gabapentin (NEURONTIN) 300 MG capsule Take 1 capsule (300 mg total) by mouth 3 (three) times daily. 06/06/23   Dettinger, Elige Radon, MD  ketoconazole (NIZORAL) 2 % cream APPLY TO AFFECTED AREA EVERYDAY 04/19/22   Delynn Flavin M, DO  levothyroxine (SYNTHROID) 100 MCG tablet TAKE 1 TABLET BY MOUTH EVERY  DAY BEFORE BREAKFAST 03/12/23   Dettinger, Elige Radon, MD  lidocaine (LIDODERM) 5 % PLACE 1 PATCH ONTO THE SKIN DAILY. REMOVE & DISCARD PATCH WITHIN 12 HOURS OR AS DIRECTED BY MD 01/04/23   Dettinger, Elige Radon, MD  linaclotide [facility]) 145 MCG CAPS capsule Take 145 mcg by mouth daily before breakfast. 03/26/23 04/19/23  [provider]  lovastatin (MEVACOR) 40 MG tablet TAKE 1 TABLET BY MOUTH EVERYDAY AT BEDTIME 06/19/22   Dettinger, Elige Radon, MD  metoprolol succinate (TOPROL-XL) 25 MG 24 hr tablet TAKE 1 TABLET (25 MG TOTAL) BY MOUTH DAILY. 06/06/23   Dettinger, Elige Radon, MD  mirabegron ER (MYRBETRIQ) 25 MG TB24 tablet  Take 1 tablet (25 mg total) by mouth daily. 06/07/20   Dettinger, Elige Radon, MD  Multiple Vitamins-Minerals (PRESERVISION AREDS 2 PO) Take by mouth.    [provider]  nystatin (MYCOSTATIN/NYSTOP) powder APPLY TO AFFECTED AREA 4 TIMES A DAY 06/06/23   Dettinger, Elige Radon, MD  olmesartan (BENICAR) 20 MG tablet TAKE 1 TABLET BY MOUTH EVERY DAY 03/12/23   Dettinger, Elige Radon, MD  omeprazole (PRILOSEC) 20 MG capsule Take 1 capsule (20 mg total) by mouth 2 (two) times daily before a meal. 06/19/22   Dettinger, Elige Radon, MD  polyethylene glycol powder (GLYCOLAX/MIRALAX) 17 GM/SCOOP powder Take 17 g by mouth daily. 01/29/23   Dettinger, Elige Radon, MD  pyridOXINE (VITAMIN B-6) 100 MG tablet Take 100 mg by mouth daily.    [provider]  sertraline (ZOLOFT) 100 MG tablet TAKE 2 TABLETS BY MOUTH EVERY DAY 06/06/23   Dettinger, Elige Radon, MD  sodium chloride (OCEAN) 0.65 % SOLN nasal spray Place 1 spray into both nostrils as needed for congestion. 05/24/17   Elenora Gamma, MD      Allergies    Nitrofurantoin, Nsaids, Penicillins, Rosuvastatin, Statins, Vytorin [ezetimibe-simvastatin], Aspirin, and Tolmetin    Review of Systems   Review of Systems  Constitutional:  Negative for chills and fever.  HENT:  Negative for ear pain and sore throat.   Eyes:  Negative for pain and visual disturbance.  Respiratory:  Positive for shortness of breath. Negative for cough.   Cardiovascular:  Positive for palpitations. Negative for chest pain.  Gastrointestinal:  Negative for abdominal pain and vomiting.  Genitourinary:  Negative for dysuria and hematuria.  Musculoskeletal:  Negative for arthralgias and back pain.  Skin:  Negative for color change and rash.  Neurological:  Negative for seizures and syncope.  All other systems reviewed and are negative.   Physical Exam Updated Vital Signs BP 138/68   Pulse 60   Temp 98.4 F (36.9 C) (Oral)   Resp (!) 22   Ht 5\' 2"  (1.575 m)   Wt 82.1 kg    SpO2 100%   BMI 33.11 kg/m  Physical Exam  ED Results / Procedures / Treatments   Labs (all labs ordered are listed, but only abnormal results are displayed) Labs Reviewed  COMPREHENSIVE METABOLIC PANEL - Abnormal; Notable for the following components:      Result Value   Sodium 134 (*)    Glucose, Bld 102 (*)    Calcium 10.6 (*)    All other components within normal limits  CBC  PROTIME-INR  TROPONIN I (HIGH SENSITIVITY)  TROPONIN I (HIGH SENSITIVITY)    EKG EKG Interpretation  Date/Time:  Monday June 11 2023 15:57:44 EDT Ventricular Rate:  142 PR Interval:    QRS Duration: 80 QT Interval:  306 QTC Calculation: 470  R Axis:   -22 Text Interpretation: Supraventricular tachycardia with Premature supraventricular complexes Possible Lateral infarct , age undetermined Abnormal ECG When compared with ECG of 06-Nov-2015 17:32, PREVIOUS ECG IS PRESENT Confirmed by Edwin Dada ([id number]) on 06/11/2023 9:43:29 PM  Radiology DG Chest 2 View  Result Date: 06/11/2023 CLINICAL DATA:  Shortness of breath, palpitations EXAM: CHEST - 2 VIEW COMPARISON:  Previous studies including the examination of 02/06/2023 FINDINGS: Transverse diameter of heart is increased. There are no signs of pulmonary edema or focal pulmonary consolidation. There is linear density in anterior right lower lung field, possibly minimal scarring. There is no pleural effusion or pneumothorax. There is reverse arthroplasty right shoulder. IMPRESSION: Cardiomegaly. There are no signs of pulmonary edema or new focal pulmonary consolidation. Electronically Signed   By: Ernie Avena M.D.   On: 06/11/2023 16:56    Procedures Procedures  {Document cardiac monitor, telemetry assessment procedure when appropriate:1}  Medications Ordered in ED Medications - No data to display  ED Course/ Medical Decision Making/ A&P   {   Click here for ABCD2, HEART and other calculatorsREFRESH Note before signing :1}                           Medical Decision Making Amount and/or Complexity of Data Reviewed Labs: ordered. Radiology: ordered.   ***  {Document critical care time when appropriate:1} {Document review of labs and clinical decision tools ie heart score, Chads2Vasc2 etc:1}  {Document your independent review of radiology images, and any outside records:1} {Document your discussion with family members, caretakers, and with consultants:1} {Document social determinants of health affecting pt's care:1} {Document your decision making why or why not admission, treatments were needed:1} Final Clinical Impression(s) / ED Diagnoses Final diagnoses:  None    Rx / DC Orders ED Discharge Orders     None

## 2023-06-12 ENCOUNTER — Other Ambulatory Visit: Payer: Self-pay | Admitting: Family Medicine

## 2023-06-12 DIAGNOSIS — B372 Candidiasis of skin and nail: Secondary | ICD-10-CM

## 2023-06-14 ENCOUNTER — Ambulatory Visit: Payer: 59 | Admitting: Physical Therapy

## 2023-06-19 ENCOUNTER — Telehealth: Payer: Self-pay

## 2023-06-19 ENCOUNTER — Encounter (HOSPITAL_COMMUNITY): Payer: Self-pay | Admitting: Physician Assistant

## 2023-06-19 ENCOUNTER — Ambulatory Visit (HOSPITAL_COMMUNITY)
Admission: RE | Admit: 2023-06-19 | Discharge: 2023-06-19 | Disposition: A | Discharge status: Home / Self Care | Payer: 59 | Source: Ambulatory Visit | Attending: Physician Assistant | Admitting: Physician Assistant

## 2023-06-19 VITALS — BP 118/72 | HR 63 | Ht 62.0 in | Wt 176.0 lb

## 2023-06-19 DIAGNOSIS — D6869 Other thrombophilia: Secondary | ICD-10-CM | POA: Diagnosis not present

## 2023-06-19 DIAGNOSIS — I10 Essential (primary) hypertension: Secondary | ICD-10-CM | POA: Insufficient documentation

## 2023-06-19 DIAGNOSIS — J9690 Respiratory failure, unspecified, unspecified whether with hypoxia or hypercapnia: Secondary | ICD-10-CM | POA: Diagnosis not present

## 2023-06-19 DIAGNOSIS — I480 Paroxysmal atrial fibrillation: Secondary | ICD-10-CM | POA: Diagnosis not present

## 2023-06-19 DIAGNOSIS — G4733 Obstructive sleep apnea (adult) (pediatric): Secondary | ICD-10-CM | POA: Insufficient documentation

## 2023-06-19 DIAGNOSIS — E785 Hyperlipidemia, unspecified: Secondary | ICD-10-CM | POA: Diagnosis not present

## 2023-06-19 DIAGNOSIS — E039 Hypothyroidism, unspecified: Secondary | ICD-10-CM | POA: Insufficient documentation

## 2023-06-19 DIAGNOSIS — I700 Atherosclerosis of aorta: Secondary | ICD-10-CM | POA: Insufficient documentation

## 2023-06-19 DIAGNOSIS — Z79899 Other long term (current) drug therapy: Secondary | ICD-10-CM | POA: Insufficient documentation

## 2023-06-19 DIAGNOSIS — Z7901 Long term (current) use of anticoagulants: Secondary | ICD-10-CM | POA: Insufficient documentation

## 2023-06-19 DIAGNOSIS — Z9981 Dependence on supplemental oxygen: Secondary | ICD-10-CM | POA: Insufficient documentation

## 2023-06-19 NOTE — Telephone Encounter (Signed)
Transition Care Management Unsuccessful Follow-up Telephone Call  Date of discharge and from where:  [facility]  Attempts:  1st Attempt  Reason for unsuccessful TCM follow-up call:  Left voice message   Lenard Forth [facility] Guide, [facility] [phone] [address] [place], [place], [place] [address] Phone: [phone] Email: [email or web address]

## 2023-06-19 NOTE — Progress Notes (Signed)
Primary Care Physician: Dettinger, Elige Radon, MD Primary Cardiologist: None Electrophysiologist: None  Referring Physician: [facility]   Danielle Parrish is a 82 y.o. female with a history of DM, HLD, aortic atherosclerosis, HTN, hypothyroidism, OSA, chronic respiratory failure on O2, atrial fibrillation who presents for consultation in the [facility].  The patient was initially diagnosed with atrial fibrillation 06/11/23 after presenting to Dr Thurston Hole office for a follow up. She was found to have an irregular rapid pulse. ECG showed afib with RVR and she was sent to the ED. In hindsight, she had noted variable heart rates on her pulse oximeter prior to her visit. She spontaneously converted to SR in the ED. Patient was started on Eliquis for a CHADS2VASC score of 6.  On follow up today, patient remains in SR today. She states she feels no different in SR compared to afib. For the past couple of days she has been dealing with nasal congestion. No fever, chills. No bleeding issues since starting anticoagulation.   Today, she denies symptoms of palpitations, chest pain, orthopnea, PND, lower extremity edema, dizziness, presyncope, syncope, snoring, daytime somnolence, bleeding, or neurologic sequela. The patient is tolerating medications without difficulties and is otherwise without complaint today.    Atrial Fibrillation Risk Factors:  she does have symptoms or diagnosis of sleep apnea. she is compliant with CPAP therapy. she does not have a history of rheumatic fever.   Atrial Fibrillation Management history:  Previous antiarrhythmic drugs: none Previous cardioversions: none Previous ablations: none Anticoagulation history: Eliquis  ROS- All systems are reviewed and negative except as per the HPI above.   Physical Exam: BP 118/72   Pulse 63   Ht 5\' 2"  (1.575 m)   Wt 79.8 kg   BMI 32.19 kg/m   GEN: Well nourished, well developed in no acute  distress NECK: No JVD; No carotid bruits CARDIAC: Regular rate and rhythm, no murmurs, rubs, gallops RESPIRATORY:  Clear to auscultation without rales, wheezing or rhonchi, on O2 nasal canula  ABDOMEN: Soft, non-tender, non-distended EXTREMITIES:  No edema; No deformity   Wt Readings from Last 3 Encounters:  06/19/23 79.8 kg  06/11/23 82.1 kg  06/11/23 82.2 kg     EKG today demonstrates  SR, 1st degree AV block HR 63 bpm PR 208 ms QRS 86 ms QT/QTc 392/401  Echo 07/11/22 demonstrated   1. Left ventricular ejection fraction, by estimation, is 60 to 65%. The  left ventricle has normal function. The left ventricle has no regional  wall motion abnormalities. There is mild left ventricular hypertrophy.  Left ventricular diastolic parameters are consistent with Grade I diastolic dysfunction (impaired relaxation). The average left ventricular global longitudinal strain is -19.2 %. The global longitudinal strain is normal.   2. Right ventricular systolic function is low normal. The right  ventricular size is normal. There is normal pulmonary artery systolic  pressure. The estimated right ventricular systolic pressure is 7.2 mmHg.   3. The mitral valve is degenerative. Trivial mitral valve regurgitation.   4. The aortic valve is tricuspid. There is mild calcification of the  aortic valve. Aortic valve regurgitation is mild. Mild aortic valve  stenosis. Aortic regurgitation PHT measures 1034 msec. Aortic valve area,  by VTI measures 1.86 cm. Aortic valve  mean gradient measures 9.0 mmHg.   5. The inferior vena cava is normal in size with greater than 50%  respiratory variability, suggesting right atrial pressure of 3 mmHg.   Comparison(s): Prior  images unable to be directly viewed.    CHA2DS2-VASc Score = 6  The patient's score is based upon: CHF History: 0 HTN History: 1 Diabetes History: 1 Stroke History: 0 Vascular Disease History: 1 (aortic atherosclerosis) Age Score:  2 Gender Score: 1       ASSESSMENT AND PLAN: Paroxysmal Atrial Fibrillation (ICD10:  I48.0) The patient's CHA2DS2-VASc score is 6, indicating a 9.7% annual risk of stroke.   General education about afib provided and questions answered. We also discussed her stroke risk and the risks and benefits of anticoagulation. We discussed possibly starting AAD, she would prefer to avoid more long term medications for now. Not likely a good candidate for amiodarone with her baseline lung disease. Could consider flecainide (ischemic eval first) or dofetilide. She would not be a candidate for invasive EP procedures.  Continue Eliquis 5 mg BID Continue Toprol 25 mg daily  Secondary Hypercoagulable State (ICD10:  D68.69) The patient is at significant risk for stroke/thromboembolism based upon her CHA2DS2-VASc Score of 6.  Continue Apixaban (Eliquis).   OSA  Encouraged nightly CPAP  HTN Stable on current regimen   Patient prefers to follow up in [place], will refer. Seen by Dr Diona Browner remotely.    Jorja Loa PA-C [facility] [address] [place], [place] [address] [phone]

## 2023-06-20 ENCOUNTER — Telehealth: Payer: Self-pay

## 2023-06-20 NOTE — Telephone Encounter (Signed)
Transition Care Management Unsuccessful Follow-up Telephone Call  Date of discharge and from where:  [facility]  Attempts:  2nd Attempt  Reason for unsuccessful TCM follow-up call:  Left voice message     Lenard Forth [facility] Guide, [facility] [phone] [address] [place], [place], [place] [address] Phone: [phone] Email: [email or web address]

## 2023-06-29 ENCOUNTER — Encounter: Payer: Self-pay | Admitting: Family Medicine

## 2023-06-29 ENCOUNTER — Other Ambulatory Visit: Payer: Self-pay | Admitting: Family Medicine

## 2023-06-29 ENCOUNTER — Ambulatory Visit (INDEPENDENT_AMBULATORY_CARE_PROVIDER_SITE_OTHER): Payer: 59 | Admitting: Family Medicine

## 2023-06-29 VITALS — BP 131/72 | HR 69 | Ht 62.0 in | Wt 180.0 lb

## 2023-06-29 DIAGNOSIS — E1169 Type 2 diabetes mellitus with other specified complication: Secondary | ICD-10-CM

## 2023-06-29 DIAGNOSIS — I152 Hypertension secondary to endocrine disorders: Secondary | ICD-10-CM

## 2023-06-29 DIAGNOSIS — I5032 Chronic diastolic (congestive) heart failure: Secondary | ICD-10-CM

## 2023-06-29 DIAGNOSIS — I480 Paroxysmal atrial fibrillation: Secondary | ICD-10-CM

## 2023-06-29 DIAGNOSIS — E7800 Pure hypercholesterolemia, unspecified: Secondary | ICD-10-CM | POA: Diagnosis not present

## 2023-06-29 DIAGNOSIS — E785 Hyperlipidemia, unspecified: Secondary | ICD-10-CM | POA: Diagnosis not present

## 2023-06-29 DIAGNOSIS — E1159 Type 2 diabetes mellitus with other circulatory complications: Secondary | ICD-10-CM

## 2023-06-29 DIAGNOSIS — R2243 Localized swelling, mass and lump, lower limb, bilateral: Secondary | ICD-10-CM

## 2023-06-29 DIAGNOSIS — E039 Hypothyroidism, unspecified: Secondary | ICD-10-CM

## 2023-06-29 DIAGNOSIS — I110 Hypertensive heart disease with heart failure: Secondary | ICD-10-CM | POA: Diagnosis not present

## 2023-06-29 DIAGNOSIS — B372 Candidiasis of skin and nail: Secondary | ICD-10-CM

## 2023-06-29 LAB — BAYER DCA HB A1C WAIVED: HB A1C (BAYER DCA - WAIVED): 6 % — ABNORMAL HIGH (ref 4.8–5.6)

## 2023-06-29 NOTE — Progress Notes (Signed)
BP 131/72   Pulse 69   Ht 5\' 2"  (1.575 m)   Wt 180 lb (81.6 kg)   SpO2 (!) 89%   BMI 32.92 kg/m    Subjective:   Patient ID: Danielle Parrish, female    DOB: Oct 07, 1941, 82 y.o.   MRN: [id number]  HPI: Danielle Parrish is a 82 y.o. female presenting on 06/29/2023 for Medical Management of Chronic Issues, Diabetes, Hyperlipidemia, Hypertension, Hypothyroidism, and Foreign Body in Skin (Right elbow)   HPI Type 2 diabetes mellitus Patient comes in today for recheck of his diabetes. Patient has been currently taking no medication for diabetes, diet controlled, A1c 6.0 today.. Patient is currently on an ACE inhibitor/ARB. Patient has not seen an ophthalmologist this year. Patient denies any new issues with their feet. The symptom started onset as an adult and A-fib and CHF ARE RELATED TO DM   Hypertension and CHF and new A-fib Patient is currently on amlodipine and furosemide and metoprolol., and their blood pressure today is 131/72. Patient denies any lightheadedness or dizziness. Patient denies headaches, blurred vision, chest pains, shortness of breath, or weakness. Denies any side effects from medication and is content with current medication.  Patient was seen in the ED recently for new onset A-fib with RVR and got under rate control.  Saw cardiology for follow-up and management.  Patient is mostly wearing oxygen and was not wearing it today.  Hyperlipidemia Patient is coming in for recheck of his hyperlipidemia. The patient is currently taking lovastatin. They deny any issues with myalgias or history of liver damage from it. They deny any focal numbness or weakness or chest pain.   Hypothyroidism recheck Patient is coming in for thyroid recheck today as well. They deny any issues with hair changes or heat or cold problems or diarrhea or constipation. They deny any chest pain or palpitations. They are currently on levothyroxine 100 micrograms   Relevant past medical, surgical, family  and social history reviewed and updated as indicated. Interim medical history since our last visit reviewed. Allergies and medications reviewed and updated.  Review of Systems  Constitutional:  Negative for chills and fever.  Eyes:  Negative for visual disturbance.  Respiratory:  Negative for chest tightness and shortness of breath.   Cardiovascular:  Negative for chest pain and leg swelling.  Genitourinary:  Negative for difficulty urinating and dysuria.  Musculoskeletal:  Negative for back pain and gait problem.  Skin:  Negative for rash.  Neurological:  Negative for dizziness, light-headedness and headaches.  Psychiatric/Behavioral:  Negative for agitation and behavioral problems.   All other systems reviewed and are negative.   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    Penicillins Hives, Other (See Comments)   Has patient had a PCN reaction causing immediate rash, facial/tongue/throat swelling, SOB or lightheadedness with hypotension: Yes Has patient had a PCN reaction causing severe rash involving mucus membranes or skin necrosis: unknown Has patient had a PCN reaction that required hospitalization No Has patient had a PCN reaction occurring within the last 10 years: No If all of the above answers are "NO", then may proceed with Cephalosporin use.   Rosuvastatin Other (See Comments)   Body aches.   Statins Other (See Comments)   Vytorin [ezetimibe-simvastatin]    Body aches.  Aspirin Nausea Only   Tolmetin Nausea Only   Burns stomach.         Medication List        Accurate as of June 29, 2023 11:35 AM. If you have any questions, ask your nurse or doctor.          acetaminophen 500 MG tablet Commonly known as: TYLENOL Take 1 tablet (500 mg total) by mouth every 6 (six) hours as needed.    albuterol 108 (90 Base) MCG/ACT inhaler Commonly known as: VENTOLIN HFA Take 2 puffs eveery 6 hours as needed for wheezing or shortness of breath   amLODipine 10 MG tablet Commonly known as: NORVASC TAKE 1 TABLET BY MOUTH EVERY DAY   apixaban 5 MG Tabs tablet Commonly known as: ELIQUIS Take 1 tablet (5 mg total) by mouth 2 (two) times daily.   azelastine 0.1 % nasal spray Commonly known as: ASTELIN Place 1 spray into both nostrils 2 (two) times daily. Use in each nostril as directed   calcium-vitamin D 500-200 MG-UNIT Tabs tablet Commonly known as: OSCAL WITH D TAKE 1 TABLET BY MOUTH EVERY DAY WITH BREAKFAST   cetirizine 10 MG tablet Commonly known as: ZYRTEC Take 1 tablet (10 mg total) by mouth daily.   cyclobenzaprine 5 MG tablet Commonly known as: FLEXERIL Take 1 tablet (5 mg total) by mouth 3 (three) times daily as needed for muscle spasms.   diclofenac Sodium 1 % Gel Commonly known as: VOLTAREN APPLY 4 GRAMS TOPICALLY 4 (FOUR) TIMES DAILY.   diphenhydrAMINE 25 MG tablet Commonly known as: BENADRYL Take 50 mg by mouth at bedtime as needed for allergies.   furosemide 20 MG tablet Commonly known as: LASIX TAKE 1 TABLET BY MOUTH EVERY DAY   gabapentin 300 MG capsule Commonly known as: NEURONTIN TAKE 1 CAPSULE BY MOUTH THREE TIMES A DAY What changed: See the new instructions. Changed by: Elige Radon Kamilla Hands, MD   Iron 325 (65 Fe) MG Tabs Take 1 tablet by mouth 3 (three) times daily.   ketoconazole 2 % cream Commonly known as: NIZORAL APPLY TO AFFECTED AREA EVERYDAY   levothyroxine 100 MCG tablet Commonly known as: SYNTHROID TAKE 1 TABLET BY MOUTH EVERY DAY BEFORE BREAKFAST   lidocaine 5 % Commonly known as: LIDODERM PLACE 1 PATCH ONTO THE SKIN DAILY. REMOVE & DISCARD PATCH WITHIN 12 HOURS OR AS DIRECTED BY MD   linaclotide 145 MCG Caps capsule Commonly known as: LINZESS Take 145 mcg by mouth as needed.   lovastatin 40 MG tablet Commonly known as:  MEVACOR TAKE 1 TABLET BY MOUTH EVERYDAY AT BEDTIME   metoprolol succinate 25 MG 24 hr tablet Commonly known as: TOPROL-XL TAKE 1 TABLET (25 MG TOTAL) BY MOUTH DAILY.   mirabegron ER 25 MG Tb24 tablet Commonly known as: Myrbetriq Take 1 tablet (25 mg total) by mouth daily.   nystatin powder Commonly known as: MYCOSTATIN/NYSTOP APPLY TO AFFECTED AREA 4 TIMES A DAY   olmesartan 20 MG tablet Commonly known as: BENICAR TAKE 1 TABLET BY MOUTH EVERY DAY   omeprazole 20 MG capsule Commonly known as: PRILOSEC Take 1 capsule (20 mg total) by mouth 2 (two) times daily before a meal. What changed: when to take this   polyethylene glycol powder 17 GM/SCOOP powder Commonly known as: GLYCOLAX/MIRALAX Take 17 g by mouth daily.   PRESERVISION AREDS 2 PO Take by mouth.   pyridOXINE 100 MG tablet Commonly known as: VITAMIN B6 Take 100 mg by mouth daily.   sertraline  100 MG tablet Commonly known as: ZOLOFT TAKE 2 TABLETS BY MOUTH EVERY DAY   sodium chloride 0.65 % Soln nasal spray Commonly known as: OCEAN Place 1 spray into both nostrils as needed for congestion.   VITAMIN D-3 PO Take 1 capsule by mouth daily.         Objective:   BP 131/72   Pulse 69   Ht 5\' 2"  (1.575 m)   Wt 180 lb (81.6 kg)   SpO2 (!) 89%   BMI 32.92 kg/m   Wt Readings from Last 3 Encounters:  06/29/23 180 lb (81.6 kg)  06/19/23 176 lb (79.8 kg)  06/11/23 181 lb (82.1 kg)    Physical Exam Vitals and nursing note reviewed.  Constitutional:      General: She is not in acute distress.    Appearance: She is well-developed. She is not diaphoretic.  Eyes:     Conjunctiva/sclera: Conjunctivae normal.  Cardiovascular:     Rate and Rhythm: Normal rate and regular rhythm.     Heart sounds: Normal heart sounds. No murmur heard. Pulmonary:     Effort: Pulmonary effort is normal. No respiratory distress.     Breath sounds: Normal breath sounds. No wheezing.  Musculoskeletal:        General: No  swelling or tenderness. Normal range of motion.  Skin:    General: Skin is warm and dry.     Findings: No rash.  Neurological:     Mental Status: She is alert and oriented to person, place, and time.     Coordination: Coordination normal.  Psychiatric:        Behavior: Behavior normal.     A1c 6.0.  Assessment & Plan:   Problem List Items Addressed This Visit       Cardiovascular and Mediastinum   Hypertension associated with type 2 diabetes mellitus (HCC)   Chronic diastolic heart failure (HCC)   Paroxysmal atrial fibrillation (HCC)     Endocrine   Hyperlipidemia associated with type 2 diabetes mellitus (HCC)   Type 2 diabetes mellitus with hypercholesterolemia (HCC) - Primary   Relevant Orders   Bayer DCA Hb A1c Waived   Hypothyroidism    Able to remove a small splinter from right forearm, recommended topical antibiotic cream and watch for infection.  Time spent with patient discussing chronic care management and exam is as above Follow up plan: Return in about 3 months (around 09/29/2023), or if symptoms worsen or fail to improve, for Diabetes and hypertension cholesterol and thyroid.  Counseling provided for all of the vaccine components Orders Placed This Encounter  Procedures   Bayer DCA Hb A1c Waived    Arville Care, MD [facility] 06/29/2023, 11:35 AM

## 2023-07-03 ENCOUNTER — Telehealth: Payer: Self-pay | Admitting: Family Medicine

## 2023-07-03 DIAGNOSIS — D499 Neoplasm of unspecified behavior of unspecified site: Secondary | ICD-10-CM

## 2023-07-03 NOTE — Telephone Encounter (Signed)
REFERRAL REQUEST Telephone Note  Have you been seen at our office for this problem? Yes  (Advise that they may need an appointment with their PCP before a referral can be done)  Reason for Referral: at last appt with Dr. Algis Downs. He mentioned she needed to have places on her skin checked Referral discussed with patient: yes  Best contact number of patient for referral team: [phone]    Has patient been seen by a specialist for this issue before: no  Patient provider preference for referral: no Patient location preference for referral: [place]   Patient notified that referrals can take up to a week or longer to process. If they haven't heard anything within a week they should call back and speak with the referral department.

## 2023-07-04 ENCOUNTER — Telehealth: Payer: Self-pay | Admitting: Adult Health

## 2023-07-04 ENCOUNTER — Encounter: Payer: Self-pay | Admitting: Adult Health

## 2023-07-04 NOTE — Telephone Encounter (Signed)
Unable to reach pt over the phone, no VM box. Sent mychart msg informing pt of need to reschedule 09/20/23 appt - NP out

## 2023-07-05 NOTE — Telephone Encounter (Signed)
Pt made aware

## 2023-07-05 NOTE — Telephone Encounter (Signed)
Sent referral to dermatology for her

## 2023-07-09 DIAGNOSIS — G4733 Obstructive sleep apnea (adult) (pediatric): Secondary | ICD-10-CM | POA: Diagnosis not present

## 2023-07-11 DIAGNOSIS — J9611 Chronic respiratory failure with hypoxia: Secondary | ICD-10-CM | POA: Diagnosis not present

## 2023-07-11 DIAGNOSIS — R0609 Other forms of dyspnea: Secondary | ICD-10-CM | POA: Diagnosis not present

## 2023-07-12 ENCOUNTER — Other Ambulatory Visit: Payer: Self-pay

## 2023-07-12 ENCOUNTER — Ambulatory Visit: Payer: 59 | Admitting: Family

## 2023-07-12 ENCOUNTER — Encounter: Payer: Self-pay | Admitting: Family

## 2023-07-12 ENCOUNTER — Ambulatory Visit: Payer: 59 | Attending: Family Medicine | Admitting: Physical Therapy

## 2023-07-12 ENCOUNTER — Encounter: Payer: Self-pay | Admitting: Physical Therapy

## 2023-07-12 VITALS — BP 139/71 | HR 60 | Temp 97.1°F | Ht 62.0 in | Wt 181.6 lb

## 2023-07-12 DIAGNOSIS — L0291 Cutaneous abscess, unspecified: Secondary | ICD-10-CM

## 2023-07-12 DIAGNOSIS — R293 Abnormal posture: Secondary | ICD-10-CM | POA: Diagnosis not present

## 2023-07-12 DIAGNOSIS — M62830 Muscle spasm of back: Secondary | ICD-10-CM | POA: Insufficient documentation

## 2023-07-12 DIAGNOSIS — R600 Localized edema: Secondary | ICD-10-CM | POA: Diagnosis not present

## 2023-07-12 DIAGNOSIS — M545 Low back pain, unspecified: Secondary | ICD-10-CM | POA: Insufficient documentation

## 2023-07-12 DIAGNOSIS — L02416 Cutaneous abscess of left lower limb: Secondary | ICD-10-CM

## 2023-07-12 DIAGNOSIS — G8929 Other chronic pain: Secondary | ICD-10-CM | POA: Diagnosis not present

## 2023-07-12 NOTE — Patient Instructions (Signed)
Skin Abscess  A skin abscess is an infected area on or under your skin. It contains pus and other material. An abscess may also be called a furuncle, carbuncle, or boil. It is often the result of an infection caused by bacteria. An abscess can occur in or on almost any part of your body. Sometimes, an abscess may break open (rupture) on its own. In most cases, it will keep getting worse unless it is treated. An abscess can cause pain and make you feel ill. An untreated abscess can cause infection to spread to other parts of your body or your bloodstream. The abscess may need to be drained. You may also need to take antibiotics. What are the causes? An abscess occurs when germs, like bacteria, pass through your skin and cause an infection. This may be caused by: A scrape or cut on your skin. A puncture wound through your skin, such as a needle injection or insect bite. Blocked oil or sweat glands. Blocked and infected hair follicles. A fluid-filled sac that forms beneath your skin (sebaceous cyst) and becomes infected. What increases the risk? You may be more likely to develop an abscess if: You have problems with blood circulation, or you have a weak body defense system (immune system). You have diabetes. You have dry and irritated skin. You get injections often or use IV drugs. You have a foreign body in a wound, such as a splinter. You smoke or use tobacco products. What are the signs or symptoms? Symptoms of this condition include: A painful, firm bump under the skin. A bump with pus at the top. This may break through the skin and drain. Other symptoms include: Redness and swelling around the abscess. Warmth or tenderness. Swelling of the lymph nodes (glands) near the abscess. A sore on the skin. How is this diagnosed? This condition may be diagnosed based on a physical exam and your medical history. You may also have tests done, such as: A test of a sample of pus. This may be done  to find what is causing the infection. Blood tests. Imaging tests, such as an ultrasound, CT scan, or MRI. How is this treated? A small abscess that drains on its own may not need to be treated. Treatment for larger abscesses may include: Moist heat or a heat pack applied to the area a few times a day. Incision and drainage. This is a procedure to drain the abscess. Antibiotics. For a severe abscess, you may first get antibiotics through an IV and then change to antibiotics by mouth. Follow these instructions at home: Medicines Take over-the-counter and prescription medicines only as told by your provider. If you were prescribed antibiotics, take them as told by your provider. Do not stop using the antibiotic even if you start to feel better. Abscess care  If you have an abscess that has not drained, apply heat to the affected area. Use the heat source that your provider recommends, such as a moist heat pack or a heating pad. Place a towel between your skin and the heat source. Leave the heat on for 20-30 minutes at a time. If your skin turns bright red, remove the heat right away to prevent burns. The risk of burns is higher if you cannot feel pain, heat, or cold. Follow instructions from your provider about how to take care of your abscess. Make sure you: Cover the abscess with a bandage (dressing). Wash your hands with soap and water for at least 20 seconds before  and after you change the dressing or gauze. If soap and water are not available, use hand sanitizer. Change your dressing or gauze as told by your provider. Check your abscess every day for signs of an infection that is getting worse. Check for: More redness, swelling, pain, or tenderness. More fluid or blood. Warmth. More pus or a worse smell. General instructions To avoid spreading the infection: Do not share personal care items, towels, or hot tubs with others. Avoid making skin contact with other people. Be careful  when getting rid of used dressings, wound packing, or any drainage from the abscess. Do not use any products that contain nicotine or tobacco. These products include cigarettes, chewing tobacco, and vaping devices, such as e-cigarettes. If you need help quitting, ask your provider. Do not use any creams, ointments, or liquids unless you have been told to by your provider. Contact a health care provider if: You see redness that spreads quickly or red streaks on your skin spreading away from the abscess. You have any signs of worse infection at the abscess. You vomit every time you eat or drink. You have a fever, chills, or muscle aches. The cyst or abscess returns. Get help right away if: You have severe pain. You make less pee (urine) than normal. This information is not intended to replace advice given to you by your health care provider. Make sure you discuss any questions you have with your health care provider. Document Revised: 07/26/2022 Document Reviewed: 07/26/2022 Elsevier Patient Education  2024 [employer].

## 2023-07-12 NOTE — Progress Notes (Signed)
Subjective:    Patient ID: Danielle Parrish, female    DOB: [birth year], 82 y.o.   MRN: [id number]  Chief Complaint  Patient presents with   Cyst    Left leg x 5 days     HPI PT presents to the office today with "lump" on left lower leg. Reports she noticed it a week ago and reports it may be slightly small. Denies any injury or fevers.  Reports mild aching pain of 6 out 10.    Review of Systems  Constitutional: Negative.   HENT: Negative.    Eyes: Negative.   Respiratory: Negative.  Negative for shortness of breath.   Cardiovascular: Negative.  Negative for palpitations.  Gastrointestinal: Negative.   Endocrine: Negative.   Genitourinary: Negative.   Musculoskeletal: Negative.   Neurological: Negative.  Negative for headaches.  Hematological: Negative.   Psychiatric/Behavioral: Negative.    All other systems reviewed and are negative.      Objective:   Physical Exam Vitals reviewed.  Constitutional:      General: She is not in acute distress.    Appearance: She is well-developed.  HENT:     Head: Normocephalic and atraumatic.     Mouth/Throat:     Mouth: Oropharynx is clear and moist.  Eyes:     Pupils: Pupils are equal, round, and reactive to light.  Neck:     Thyroid: No thyromegaly.  Cardiovascular:     Rate and Rhythm: Normal rate and regular rhythm.     Heart sounds: Normal heart sounds. No murmur heard. Pulmonary:     Effort: Pulmonary effort is normal. No respiratory distress.     Breath sounds: No wheezing.     Comments: 4L O2  Abdominal:     General: Bowel sounds are normal. There is no distension.     Palpations: Abdomen is soft.     Tenderness: There is no abdominal tenderness.  Musculoskeletal:        General: Tenderness present. Normal range of motion.     Cervical back: Normal range of motion and neck supple.     Right lower leg: No edema.     Left lower leg: Edema (trace) present.  Skin:    General: Skin is warm and dry.     Comments:  Abscess approx 1.8X2  Neurological:     Mental Status: She is alert and oriented to person, place, and time.     Cranial Nerves: No cranial nerve deficit.     Deep Tendon Reflexes: Reflexes are normal and symmetric.  Psychiatric:        Mood and Affect: Mood and affect normal.        Behavior: Behavior normal.        Thought Content: Thought content normal.        Judgment: Judgment normal.     BP 139/71   Pulse 60   Temp (!) 97.1 F (36.2 C) (Temporal)   Ht 5\' 2"  (1.575 m)   Wt 181 lb 9.6 oz (82.4 kg)   SpO2 93% Comment: on 4 litters  BMI 33.22 kg/m        Assessment & Plan:  Makailey Hodgkin Sarli comes in today with chief complaint of Cyst (Left leg x 5 days )   Diagnosis and orders addressed:  1. Abscess Area slightly hard and fluid-fillled in sections, unsure if this is an abscess or hematoma. Will not cut area today.  Area Marked She will call Monday if area is  not improving or worsening    2. Peripheral edema Compression hose Low salt diet  Elevation  Jannifer Rodney, FNP

## 2023-07-12 NOTE — Therapy (Signed)
OUTPATIENT PHYSICAL THERAPY THORACOLUMBAR EVALUATION   Patient Name: Danielle Parrish MRN: [id number] DOB:Nov 10, 1941, 82 y.o., female Today's Date: 07/12/2023  END OF SESSION:  PT End of Session - 07/12/23 1422     Visit Number 1    Number of Visits 8    Date for PT Re-Evaluation 10/03/23    PT Start Time 0145    PT Stop Time 0211    PT Time Calculation (min) 26 min    Activity Tolerance Patient limited by fatigue;Patient tolerated treatment well    Behavior During Therapy [facility] for tasks assessed/performed             Past Medical History:  Diagnosis Date   Anxiety    Arthritis    CHF (congestive heart failure) (HCC)    Chronic pain of multiple joints    Depression    Essential hypertension    GERD (gastroesophageal reflux disease)    Hyperlipidemia    Hyperlipidemia associated with type 2 diabetes mellitus (HCC)    Hypertension associated with type 2 diabetes mellitus (HCC)    Hypothyroidism    Mild scoliosis 10/19/2021   Osteoporosis    Sleep apnea    CPAP   Type 2 diabetes mellitus with hypercholesterolemia (HCC)    Vitamin D deficiency    Past Surgical History:  Procedure Laterality Date   ABDOMINAL HERNIA REPAIR     ABDOMINAL HYSTERECTOMY  1977   BREAST EXCISIONAL BIOPSY Right    EYE SURGERY  2020   bilateral cataract removal    KNEE ARTHROSCOPY Left 09/30/2014   Procedure: LEFT KNEE ARTHROSCOPY MEDIAL MENISECTOMY ABRASION CHONDROPLASTY SYNOVECTOMY SUPRAPATELLER CUFF;  Surgeon: Jacki Cones, MD;  Location: [facility];  Service: Orthopedics;  Laterality: Left;   Patient Active Problem List   Diagnosis Date Noted   Paroxysmal atrial fibrillation (HCC) 06/19/2023   Hypercoagulable state due to paroxysmal atrial fibrillation (HCC) 06/19/2023   Chronic respiratory failure with hypoxia (HCC) 07/10/2022   Chronic bilateral back pain 10/28/2021   Chronic midline low back pain without sciatica 01/30/2019   Age-related osteoporosis with current pathol fracture  of vertebra (HCC) 10/28/2018   Calcification of aorta (HCC) 10/02/2018   Compression fracture of T12 vertebra (HCC) 10/01/2018   Primary osteoarthritis of right knee 07/20/2017   Varicose veins of left lower extremity with complications 07/31/2016   OSA (obstructive sleep apnea) 09/14/2015   Chronic diastolic heart failure (HCC) 09/10/2015   Orthopnea 09/02/2015   Dysphagia, pharyngoesophageal phase 08/04/2015   Anorexia 08/04/2015   Fatigue 08/04/2015   Malaise and fatigue 08/04/2015   Depression, recurrent (HCC) 04/08/2015   Vitamin D deficiency    Anxiety    GERD (gastroesophageal reflux disease)    Arthritis    Hypertension associated with type 2 diabetes mellitus (HCC) 05/15/2013   Hyperlipidemia associated with type 2 diabetes mellitus (HCC) 05/15/2013   Type 2 diabetes mellitus with hypercholesterolemia (HCC) 05/15/2013   Morbid obesity (HCC) 05/15/2013   Hypothyroidism 05/15/2013   REFERRING PROVIDER: Ivin Booty Dettinger MD  REFERRING DIAG: DDD, Lumbar.  Rationale for Evaluation and Treatment: Rehabilitation  THERAPY DIAG:  Chronic bilateral low back pain without sciatica  Abnormal posture  Muscle spasm of back  ONSET DATE: Ongoing.  SUBJECTIVE:  SUBJECTIVE STATEMENT: The patient returns to the clinic today with c/o low back pain but better than before and rates her pain as moderate. She states her knees hurt but injections have helped. She is now on portable 02 which has helped with the SOB she had been experiencing. She is using a quad cane on her left for safety and reports no falls.  She would like to get in better condition and be able to increase her activity level.   PERTINENT HISTORY:  OA, left knee surgery, HTN, DM, OP, DOE.   PAIN:  Are you having pain? Yes: NPRS scale:  3-4/10 Pain location: LB and bilateral knees. Pain description: Ache and throbbing. Aggravating factors: Increased up time. Relieving factors: Not much.  PRECAUTIONS: Other: Supervised gait please.  WEIGHT BEARING RESTRICTIONS: No  FALLS:  Has patient fallen in last 6 months? No  LIVING ENVIRONMENT: Lives with: lives with their family Lives in: House/apartment Has following equipment at home: Quad cane small base  OCCUPATION: Retired.  PLOF: Independent with household mobility with device and Needs assistance with homemaking  PATIENT GOALS: Move better.  OBJECTIVE:   POSTURE: rounded shoulders, forward head, decreased lumbar lordosis, increased thoracic kyphosis, and flexed trunk   PALPATION: Mild pain complaints across her lower lumbar region with increase muscle tone noted.  LOWER EXTREMITY ROM:    WFL.  LOWER EXTREMITY MMT:    Patient able to provide a 5/5 strength grade for bilateral hips, knees and ankle.  FUNCTIONAL TESTS:  5 times sit to stand: 34 seconds.  GAIT:   The patient walks with a quad cane of left for safety and she walks in a flexed trunk posture with bilateral knee flexion. She is using portable 02.  OBSERVATION:  "Knot" on left shin that patient states came on about a week go for no apparent reason.  Recommended that she see her PCP.  Her caregiver was going to take her after her PT visit.    PATIENT EDUCATION:    HOME EXERCISE PROGRAM:   ASSESSMENT:  CLINICAL IMPRESSION:  The patient presents to [facility] with c/o bilateral LB and knee knee. Injection in her knees has been helpful.  The patient has multiple postural abnormalities.  She is walking with a quad cane on the left for safety and reports no falls.  She is now using portable 02 and this has helped with her SOB.  Her LE strength is 5/5.  She demonstrates a  34 sec 5 time sit to stand test with armrests used.  Her bilateral lumbar erector spinae musculature exhibits increased tone.   Patient will benefit from skilled physical therapy intervention to address pain and deficits.   OBJECTIVE IMPAIRMENTS: Abnormal gait, decreased activity tolerance, decreased mobility, increased muscle spasms, postural dysfunction, and pain.   ACTIVITY LIMITATIONS: carrying, lifting, bending, transfers, and locomotion level  PARTICIPATION LIMITATIONS: meal prep, cleaning, and laundry  PERSONAL FACTORS: 1 comorbidity: SOB, portable 02  are also affecting patient's functional outcome.   REHAB POTENTIAL: Good  CLINICAL DECISION MAKING: Evolving/moderate complexity  EVALUATION COMPLEXITY: Moderate   GOALS:    LONG TERM GOALS: Target date: 10/03/23  LONG TERM GOALS:    Ind with advanced HEP. Baseline:  Goal status: INITIAL   2.  Improve 5 time sit to stand by 6 seconds. Baseline:  Goal status: INITIAL   3.  Perform ADL's with LB and knee pain not to exceed 4/10. Baseline:  Goal status: INITIAL      PLAN:  PT FREQUENCY:  2x/week  PT DURATION: 6 weeks  PLANNED INTERVENTIONS: Therapeutic exercises, Therapeutic activity, Neuromuscular re-education, Balance training, Gait training, Patient/Family education, Self Care, Electrical stimulation, Cryotherapy, Moist heat, Ultrasound, and Manual therapy.  PLAN FOR NEXT SESSION: Nustep, core exercise progression, postural exercises, neuro-re-edu.   Blanchard Willhite, [place], PT 07/12/2023, 3:07 PM

## 2023-07-13 ENCOUNTER — Other Ambulatory Visit: Payer: Self-pay | Admitting: Family Medicine

## 2023-07-13 ENCOUNTER — Encounter: Payer: Self-pay | Admitting: Family Medicine

## 2023-07-13 ENCOUNTER — Ambulatory Visit (INDEPENDENT_AMBULATORY_CARE_PROVIDER_SITE_OTHER): Payer: 59 | Admitting: Family Medicine

## 2023-07-13 VITALS — BP 145/70 | HR 60 | Temp 97.6°F | Ht 62.0 in | Wt 180.8 lb

## 2023-07-13 DIAGNOSIS — F339 Major depressive disorder, recurrent, unspecified: Secondary | ICD-10-CM

## 2023-07-13 DIAGNOSIS — L0291 Cutaneous abscess, unspecified: Secondary | ICD-10-CM

## 2023-07-13 DIAGNOSIS — E1169 Type 2 diabetes mellitus with other specified complication: Secondary | ICD-10-CM

## 2023-07-13 DIAGNOSIS — E1159 Type 2 diabetes mellitus with other circulatory complications: Secondary | ICD-10-CM

## 2023-07-13 DIAGNOSIS — L02416 Cutaneous abscess of left lower limb: Secondary | ICD-10-CM | POA: Diagnosis not present

## 2023-07-13 DIAGNOSIS — R600 Localized edema: Secondary | ICD-10-CM

## 2023-07-13 DIAGNOSIS — B372 Candidiasis of skin and nail: Secondary | ICD-10-CM

## 2023-07-13 MED ORDER — DOXYCYCLINE HYCLATE 100 MG PO TABS
100.0000 mg | ORAL_TABLET | Freq: Two times a day (BID) | ORAL | 0 refills | Status: AC
Start: 2023-07-13 — End: 2023-07-20

## 2023-07-13 NOTE — Progress Notes (Unsigned)
   Acute Office Visit  Subjective:     Patient ID: Danielle Parrish, female    DOB: 05-02-1941, 82 y.o.   MRN: [id number]  Chief Complaint  Patient presents with   Abscess    Seen Danielle Parrish yesterday for spot on leg, has not gotten any better. Foot is also swollen for several days is also now turning blue.    HPI Patient is in today for follow up of cyst on leg. She was seen by PCP yesterday. ? Abscess vs hematoma. She was instructed to elevate her leg and follow up if no improvement. She reports increased tenderness, spreading erythema, and increased swelling. Denies fever, chills, exudate, chest pain. Shortness of breath at baseline. On 02 chronically. Area marked by PCP at visit.   ROS As per HPI.      Objective:    BP (!) 145/70   Pulse 60   Temp 97.6 F (36.4 C) (Temporal)   Ht 5\' 2"  (1.575 m)   Wt 180 lb 12.8 oz (82 kg)   SpO2 94%   BMI 33.07 kg/m    Physical Exam Vitals and nursing note reviewed.  Constitutional:      General: She is not in acute distress.    Appearance: She is not ill-appearing, toxic-appearing or diaphoretic.  Cardiovascular:     Rate and Rhythm: Normal rate and regular rhythm.     Heart sounds: Normal heart sounds. No murmur heard. Pulmonary:     Effort: Pulmonary effort is normal. No respiratory distress.     Comments: 02 3L Musculoskeletal:     Right lower leg: 1+ Edema present.     Left lower leg: 1+ Edema present.  Skin:    General: Skin is warm and dry.     Findings: Abscess (2.5 x 2.5 area of induration to left anterior mid lower leg. No exudate. Ertheyma and tenderness speading outwards from marked area.) present.  Neurological:     Mental Status: She is alert and oriented to person, place, and time. Mental status is at baseline.  Psychiatric:        Mood and Affect: Mood normal.        Behavior: Behavior normal.     No results found for any visits on 07/13/23.      Assessment & Plan:   Danielle Parrish was seen today for  abscess.  Diagnoses and all orders for this visit:  Abscess Increased erythema and tenderness. Doxycyline as below. Warm compresses. Will schedule follow up in a few days, sooner for new or worsening symptoms.  -     doxycycline (VIBRA-TABS) 100 MG tablet; Take 1 tablet (100 mg total) by mouth 2 (two) times daily for 7 days.  Peripheral edema Elevated legs, low salt diet.   The patient indicates understanding of these issues and agrees with the plan.  Gabriel Earing, FNP

## 2023-07-14 ENCOUNTER — Other Ambulatory Visit: Payer: Self-pay | Admitting: Family Medicine

## 2023-07-16 ENCOUNTER — Encounter: Payer: Self-pay | Admitting: Family

## 2023-07-16 ENCOUNTER — Ambulatory Visit (INDEPENDENT_AMBULATORY_CARE_PROVIDER_SITE_OTHER): Payer: 59 | Admitting: Family

## 2023-07-16 VITALS — BP 150/66 | HR 60 | Temp 97.8°F | Wt 177.4 lb

## 2023-07-16 DIAGNOSIS — L02416 Cutaneous abscess of left lower limb: Secondary | ICD-10-CM | POA: Diagnosis not present

## 2023-07-16 DIAGNOSIS — R600 Localized edema: Secondary | ICD-10-CM

## 2023-07-16 DIAGNOSIS — L0291 Cutaneous abscess, unspecified: Secondary | ICD-10-CM

## 2023-07-16 DIAGNOSIS — R300 Dysuria: Secondary | ICD-10-CM

## 2023-07-16 LAB — URINALYSIS, COMPLETE
Bilirubin, UA: NEGATIVE
Glucose, UA: NEGATIVE
Ketones, UA: NEGATIVE
Leukocytes,UA: NEGATIVE
Nitrite, UA: NEGATIVE
Protein,UA: NEGATIVE
RBC, UA: NEGATIVE
Specific Gravity, UA: 1.01 (ref 1.005–1.030)
Urobilinogen, Ur: 0.2 mg/dL (ref 0.2–1.0)
pH, UA: 5.5 (ref 5.0–7.5)

## 2023-07-16 LAB — MICROSCOPIC EXAMINATION
Bacteria, UA: NONE SEEN
RBC, Urine: NONE SEEN /hpf (ref 0–2)
Renal Epithel, UA: NONE SEEN /hpf

## 2023-07-16 NOTE — Progress Notes (Signed)
Subjective:    Patient ID: Danielle Parrish, female    DOB: 06-20-1941, 82 y.o.   MRN: [id number]  Chief Complaint  Patient presents with   Abscess    Follow up seen you last week and Tiffany and is back today    Pt presents to the office today to follow up on abscess on left lower leg. She was seen on 07/12/23 with a small hard area on left lower leg and was unsure if it was an abscess or hematoma. She report her symptoms worsen and was seen on 07/13/23 and was started on doxycyline. She states the swelling has improved and reports her pain is improved to a 6 out 10.   Denies any injury, fevers, or insect bites.  Abscess This is a new problem. The current episode started in the past 7 days. Pertinent negatives include no nausea or vomiting.  Dysuria  This is a new problem. The current episode started 1 to 4 weeks ago. The problem occurs intermittently. The quality of the pain is described as burning. The pain is at a severity of 4/10. The pain is mild. Associated symptoms include urgency. Pertinent negatives include no hematuria, nausea or vomiting. She has tried increased fluids for the symptoms. The treatment provided mild relief.      Review of Systems  Gastrointestinal:  Negative for nausea and vomiting.  Genitourinary:  Positive for dysuria and urgency. Negative for hematuria.  All other systems reviewed and are negative.      Objective:   Physical Exam Vitals reviewed.  Constitutional:      General: She is not in acute distress.    Appearance: She is well-developed. She is obese.  HENT:     Head: Normocephalic and atraumatic.  Eyes:     Pupils: Pupils are equal, round, and reactive to light.  Neck:     Thyroid: No thyromegaly.  Cardiovascular:     Rate and Rhythm: Normal rate and regular rhythm.     Heart sounds: Normal heart sounds. No murmur heard. Pulmonary:     Effort: Pulmonary effort is normal. No respiratory distress.     Breath sounds: Normal breath  sounds. Decreased air movement present. No wheezing.     Comments: 3l O2 Abdominal:     General: Bowel sounds are normal. There is no distension.     Palpations: Abdomen is soft.     Tenderness: There is no abdominal tenderness.  Musculoskeletal:        General: No tenderness. Normal range of motion.     Cervical back: Normal range of motion and neck supple.  Skin:    General: Skin is warm and dry.     Comments: Abscess on left anterior leg appro x2.5X2, soft and erythemas. Size has decreased since last visit.   Neurological:     Mental Status: She is alert and oriented to person, place, and time.     Cranial Nerves: No cranial nerve deficit.     Deep Tendon Reflexes: Reflexes are normal and symmetric.  Psychiatric:        Behavior: Behavior normal.        Thought Content: Thought content normal.        Judgment: Judgment normal.      BP (!) 150/66   Pulse 60   Temp 97.8 F (36.6 C) (Temporal)   Wt 177 lb 6.4 oz (80.5 kg)   SpO2 94%   BMI 32.45 kg/m       Assessment &  Plan:  Yemaya Barnier Boatman comes in today with chief complaint of Abscess (Follow up seen you last week and Tiffany and is back today )   Diagnosis and orders addressed:  1. Abscess Continue doxycyline  Cyst like area Area marked   2. Dysuria Labs pending  Force fluids - Urinalysis, Complete - Urine Culture   3. Peripheral edema Continue    Jannifer Rodney, FNP

## 2023-07-16 NOTE — Patient Instructions (Signed)
Skin Abscess  A skin abscess is an infected area on or under your skin. It contains pus and other material. An abscess may also be called a furuncle, carbuncle, or boil. It is often the result of an infection caused by bacteria. An abscess can occur in or on almost any part of your body. Sometimes, an abscess may break open (rupture) on its own. In most cases, it will keep getting worse unless it is treated. An abscess can cause pain and make you feel ill. An untreated abscess can cause infection to spread to other parts of your body or your bloodstream. The abscess may need to be drained. You may also need to take antibiotics. What are the causes? An abscess occurs when germs, like bacteria, pass through your skin and cause an infection. This may be caused by: A scrape or cut on your skin. A puncture wound through your skin, such as a needle injection or insect bite. Blocked oil or sweat glands. Blocked and infected hair follicles. A fluid-filled sac that forms beneath your skin (sebaceous cyst) and becomes infected. What increases the risk? You may be more likely to develop an abscess if: You have problems with blood circulation, or you have a weak body defense system (immune system). You have diabetes. You have dry and irritated skin. You get injections often or use IV drugs. You have a foreign body in a wound, such as a splinter. You smoke or use tobacco products. What are the signs or symptoms? Symptoms of this condition include: A painful, firm bump under the skin. A bump with pus at the top. This may break through the skin and drain. Other symptoms include: Redness and swelling around the abscess. Warmth or tenderness. Swelling of the lymph nodes (glands) near the abscess. A sore on the skin. How is this diagnosed? This condition may be diagnosed based on a physical exam and your medical history. You may also have tests done, such as: A test of a sample of pus. This may be done  to find what is causing the infection. Blood tests. Imaging tests, such as an ultrasound, CT scan, or MRI. How is this treated? A small abscess that drains on its own may not need to be treated. Treatment for larger abscesses may include: Moist heat or a heat pack applied to the area a few times a day. Incision and drainage. This is a procedure to drain the abscess. Antibiotics. For a severe abscess, you may first get antibiotics through an IV and then change to antibiotics by mouth. Follow these instructions at home: Medicines Take over-the-counter and prescription medicines only as told by your provider. If you were prescribed antibiotics, take them as told by your provider. Do not stop using the antibiotic even if you start to feel better. Abscess care  If you have an abscess that has not drained, apply heat to the affected area. Use the heat source that your provider recommends, such as a moist heat pack or a heating pad. Place a towel between your skin and the heat source. Leave the heat on for 20-30 minutes at a time. If your skin turns bright red, remove the heat right away to prevent burns. The risk of burns is higher if you cannot feel pain, heat, or cold. Follow instructions from your provider about how to take care of your abscess. Make sure you: Cover the abscess with a bandage (dressing). Wash your hands with soap and water for at least 20 seconds before  and after you change the dressing or gauze. If soap and water are not available, use hand sanitizer. Change your dressing or gauze as told by your provider. Check your abscess every day for signs of an infection that is getting worse. Check for: More redness, swelling, pain, or tenderness. More fluid or blood. Warmth. More pus or a worse smell. General instructions To avoid spreading the infection: Do not share personal care items, towels, or hot tubs with others. Avoid making skin contact with other people. Be careful  when getting rid of used dressings, wound packing, or any drainage from the abscess. Do not use any products that contain nicotine or tobacco. These products include cigarettes, chewing tobacco, and vaping devices, such as e-cigarettes. If you need help quitting, ask your provider. Do not use any creams, ointments, or liquids unless you have been told to by your provider. Contact a health care provider if: You see redness that spreads quickly or red streaks on your skin spreading away from the abscess. You have any signs of worse infection at the abscess. You vomit every time you eat or drink. You have a fever, chills, or muscle aches. The cyst or abscess returns. Get help right away if: You have severe pain. You make less pee (urine) than normal. This information is not intended to replace advice given to you by your health care provider. Make sure you discuss any questions you have with your health care provider. Document Revised: 07/26/2022 Document Reviewed: 07/26/2022 Elsevier Patient Education  2024 [employer].

## 2023-07-17 DIAGNOSIS — L03119 Cellulitis of unspecified part of limb: Secondary | ICD-10-CM | POA: Diagnosis not present

## 2023-07-17 DIAGNOSIS — M25562 Pain in left knee: Secondary | ICD-10-CM | POA: Diagnosis not present

## 2023-07-18 LAB — URINE CULTURE: Organism ID, Bacteria: NO GROWTH

## 2023-07-20 ENCOUNTER — Telehealth: Payer: Self-pay | Admitting: Family Medicine

## 2023-07-20 NOTE — Telephone Encounter (Signed)
Pt called stating that she had a visit with Tiffany on 7/19 and was prescribed an antibiotic for the lump on her leg. Says she has finished taking the medication and only sees very little improvement. Needs advise.

## 2023-07-20 NOTE — Telephone Encounter (Signed)
It has improved some  Foot is not blue or swollen  Knot has barely went down and some redness around it.  Finished ATB yesterday.  Not a lot of pain or like it was.   Advised pt to give the site a couple more days to improve. She will call back on Monday if no better or worsens for an appt.

## 2023-07-23 ENCOUNTER — Telehealth: Payer: Self-pay | Admitting: Family Medicine

## 2023-07-23 MED ORDER — SULFAMETHOXAZOLE-TRIMETHOPRIM 800-160 MG PO TABS: 1.0000 | ORAL_TABLET | Freq: Two times a day (BID) | ORAL | 0 refills | Status: DC

## 2023-07-23 NOTE — Telephone Encounter (Signed)
Called patient. Leg has improved, but still red and tender. She completed doxycyline Friday and pain is worsening. Sent in Bactrim. Warm compresses. She will call and make an appointment to be seen at the end of week. Area may need to be I&D.   Jannifer Rodney, FNP

## 2023-07-23 NOTE — Telephone Encounter (Signed)
Pt seen 07/16/2023. Pt says that place on leg no better. She is asking for another around of abx. Use [facility]

## 2023-07-30 ENCOUNTER — Telehealth: Payer: Self-pay | Admitting: Family Medicine

## 2023-07-30 DIAGNOSIS — G4733 Obstructive sleep apnea (adult) (pediatric): Secondary | ICD-10-CM | POA: Diagnosis not present

## 2023-07-30 NOTE — Telephone Encounter (Signed)
A little red  Throbbing pain that comes and goes.  Below site "skin is real tight"  Finished ATB this am  Ok to [facility] per [place]. Appt scheduled for 8/6 at 3:05pm.

## 2023-07-30 NOTE — Telephone Encounter (Signed)
  Incoming Patient Call  07/30/2023  What symptoms do you have? Knot on leg  How long have you been sick? Since 07/12/23  Have you been seen for this problem? Yes, multiple times   If your provider decides to give you a prescription, which pharmacy would you like for it to be sent to?  [id number] - [place], [place] - [address]      Patient informed that this information will be sent to the clinical staff for review and that they should receive a follow up call.

## 2023-07-31 ENCOUNTER — Encounter: Payer: Self-pay | Admitting: Family

## 2023-07-31 ENCOUNTER — Ambulatory Visit (INDEPENDENT_AMBULATORY_CARE_PROVIDER_SITE_OTHER): Payer: 59 | Admitting: Family

## 2023-07-31 VITALS — BP 108/52 | HR 66 | Temp 98.7°F | Ht 62.0 in | Wt 177.0 lb

## 2023-07-31 DIAGNOSIS — S0083XA Contusion of other part of head, initial encounter: Secondary | ICD-10-CM | POA: Diagnosis not present

## 2023-07-31 DIAGNOSIS — B3731 Acute candidiasis of vulva and vagina: Secondary | ICD-10-CM | POA: Diagnosis not present

## 2023-07-31 DIAGNOSIS — W19XXXA Unspecified fall, initial encounter: Secondary | ICD-10-CM | POA: Diagnosis not present

## 2023-07-31 DIAGNOSIS — T148XXA Other injury of unspecified body region, initial encounter: Secondary | ICD-10-CM | POA: Diagnosis not present

## 2023-07-31 DIAGNOSIS — Y92009 Unspecified place in unspecified non-institutional (private) residence as the place of occurrence of the external cause: Secondary | ICD-10-CM

## 2023-07-31 MED ORDER — FLUCONAZOLE 150 MG PO TABS
150.0000 mg | ORAL_TABLET | ORAL | 0 refills | Status: DC | PRN
Start: 2023-07-31 — End: 2023-08-06

## 2023-07-31 NOTE — Patient Instructions (Signed)
Contusion A contusion is a deep bruise. Contusions are the result of a blunt injury to tissues and muscle fibers under the skin. The injury causes bleeding under the skin. The skin over the contusion may turn blue, purple, or yellow. Minor injuries will give you a painless contusion, but more severe injuries cause contusions that can stay painful and swollen for a few weeks. Follow these instructions at home: Pay attention to any changes in your symptoms. Let your health care provider know about them. Take these actions to relieve your pain. Managing pain, stiffness, and swelling  Use resting, icing, applying pressure (compression), and raising (elevating) the injured area. This is often called the RICE method. Rest the injured area. Return to your normal activities as told by your health care provider. Ask your health care provider what activities are safe for you. If directed, put ice on the injured area. To do this: Put ice in a plastic bag. Place a towel between your skin and the bag. Leave the ice on for 20 minutes, 2-3 times a day. If your skin turns bright red, remove the ice right away to prevent skin damage. The risk of skin damage is higher if you cannot feel pain, heat, or cold. If directed, apply light compression to the injured area using an elastic bandage. Make sure the bandage is not wrapped too tightly. Remove and reapply the bandage as directed by your health care provider. If possible, elevate the injured area above the level of your heart while you are sitting or lying down. General instructions Take over-the-counter and prescription medicines only as told by your health care provider. Keep all follow-up visits. Your health care provider may want to see how your contusion is healing with treatment. Contact a health care provider if: Your symptoms do not improve after several days of treatment. Your symptoms get worse. You have difficulty moving the injured area. Get help  right away if: You have severe pain. You have numbness in a hand or foot. Your hand or foot turns pale or cold. This information is not intended to replace advice given to you by your health care provider. Make sure you discuss any questions you have with your health care provider. Document Revised: 05/29/2022 Document Reviewed: 05/29/2022 Elsevier Patient Education  2024 [employer].

## 2023-07-31 NOTE — Progress Notes (Unsigned)
Subjective:    Patient ID: Danielle Parrish, female    DOB: [birth year], 82 y.o.   MRN: [id number]  Chief Complaint  Patient presents with   Cyst   Fall   Pt presents to the office today to follow up on abscess on left lower leg. She was seen on 07/12/23 with a small hard area on left lower leg and was unsure if it was an abscess or hematoma. She report her symptoms worsen and was seen on 07/13/23 and was started on doxycyline. She completed this and was improving and was given Bactrim. She completed this yesterday. She states the swelling has improved and reports her pain is improved to a 6 out 10.    Denies any injury, fevers, or insect bites.  She fell Friday. She reports she walking and fell and hit the back of her head and then a book case fell on her face. Reports aching pain of 4 out 10. Has a lot of bruising around left eye and forehead. Denies any changes in her gait, speech, or memory. Has a small headache, but this has resolved.  Fall The accident occurred 3 to 5 days ago.  Vaginal Itching The patient's primary symptoms include genital itching and vaginal bleeding. The patient's pertinent negatives include no genital odor.      Review of Systems  All other systems reviewed and are negative.      Objective:   Physical Exam Vitals reviewed.  Constitutional:      General: She is not in acute distress.    Appearance: She is well-developed.  HENT:     Head: Normocephalic and atraumatic.  Eyes:     Pupils: Pupils are equal, round, and reactive to light.  Neck:     Thyroid: No thyromegaly.  Cardiovascular:     Rate and Rhythm: Normal rate and regular rhythm.     Heart sounds: Normal heart sounds. No murmur heard. Pulmonary:     Effort: Pulmonary effort is normal. No respiratory distress.     Breath sounds: Normal breath sounds. No wheezing.  Abdominal:     General: Bowel sounds are normal. There is no distension.     Palpations: Abdomen is soft.     Tenderness:  There is no abdominal tenderness.  Musculoskeletal:        General: No tenderness. Normal range of motion.     Cervical back: Normal range of motion and neck supple.  Skin:    General: Skin is warm and dry.          Comments: 1.5X1.8 cyst   Neurological:     Mental Status: She is alert and oriented to person, place, and time.     Cranial Nerves: No cranial nerve deficit.     Deep Tendon Reflexes: Reflexes are normal and symmetric.  Psychiatric:        Behavior: Behavior normal.        Thought Content: Thought content normal.        Judgment: Judgment normal.              Assessment & Plan:   Danielle Parrish comes in today with chief complaint of Cyst and Fall   Diagnosis and orders addressed:  1. Contusion of face, initial encounter   2. Vagina, candidiasis Start diflucan  Keep clean and dry - fluconazole (DIFLUCAN) 150 MG tablet; Take 1 tablet (150 mg total) by mouth every three (3) days as needed.  Dispense: 3 tablet; Refill: 0  3. Fall in home, initial encounter Fall precautions discussed   4. Hematoma Declines CT at this time Discussed red flags to go to ED, changes in gait, speech, memory   Will give rx of doxycyline if leg becomes red, swollen, or increased tender. Pt is very anxious that it will become worse.    Follow up plan: Keep follow up with PCP  Jannifer Rodney, FNP

## 2023-08-02 MED ORDER — DOXYCYCLINE HYCLATE 100 MG PO TABS: 100.0000 mg | ORAL_TABLET | Freq: Two times a day (BID) | ORAL | 0 refills | Status: DC

## 2023-08-03 ENCOUNTER — Telehealth: Payer: Self-pay | Admitting: Family Medicine

## 2023-08-03 NOTE — Telephone Encounter (Signed)
Pt made aware. She will call back Monday with an update.

## 2023-08-03 NOTE — Telephone Encounter (Signed)
Sounds like Neysa Bonito was considering ordering a CT scan for this if the patient was not doing well but since it is Friday at 4, there is no way that I could order a CT scan at this point so if she continues to not do well over the weekend then she will have to go to the emergency department.  If not she can give [place] a call back Monday if she does not end up going that route.  I would recommend to ice the area a lot, 10 minutes on 10 minutes off

## 2023-08-06 ENCOUNTER — Ambulatory Visit (INDEPENDENT_AMBULATORY_CARE_PROVIDER_SITE_OTHER): Payer: 59 | Admitting: Family Medicine

## 2023-08-06 ENCOUNTER — Encounter: Payer: Self-pay | Admitting: Family Medicine

## 2023-08-06 VITALS — BP 130/75 | HR 83 | Temp 98.2°F | Ht 62.0 in | Wt 179.0 lb

## 2023-08-06 DIAGNOSIS — S0083XD Contusion of other part of head, subsequent encounter: Secondary | ICD-10-CM | POA: Diagnosis not present

## 2023-08-06 DIAGNOSIS — T148XXA Other injury of unspecified body region, initial encounter: Secondary | ICD-10-CM

## 2023-08-06 NOTE — Progress Notes (Signed)
   Acute Office Visit  Subjective:     Patient ID: Danielle Parrish, female    DOB: 12-01-1941, 82 y.o.   MRN: [id number]  Chief Complaint  Patient presents with   place on leg    HPI Patient is in today for follow up of cellulitis. She has now completed 2 rounds of abx with bactrim last completed a few weeks ago. She reports that there is still a small bump there. No pain, tenderness, warmth, exudate, fever, swelling. She previously had it drained with bloody exudate.   ROS As per HPI.      Objective:    BP 130/75   Pulse 83   Temp 98.2 F (36.8 C) (Temporal)   Ht 5\' 2"  (1.575 m)   Wt 179 lb (81.2 kg)   SpO2 94%   BMI 32.74 kg/m    Physical Exam Vitals and nursing note reviewed.  Constitutional:      General: She is not in acute distress.    Appearance: She is not toxic-appearing or diaphoretic.  Pulmonary:     Effort: Pulmonary effort is normal. No respiratory distress.  Musculoskeletal:     Right lower leg: No edema.     Left lower leg: No edema.     Comments: 0.5 cm round hematoma present on left anterior lower leg. Soft. No warmth, erythema, tenderness. No swelling.   Skin:    General: Skin is warm and dry.  Neurological:     General: No focal deficit present.     Mental Status: She is alert and oriented to person, place, and time.  Psychiatric:        Mood and Affect: Mood normal.     No results found for any visits on 08/06/23.      Assessment & Plan:   Seven was seen today for place on leg.  Diagnoses and all orders for this visit:  Hematoma Cellulitis resolved. Discussed symptomatic care and return precautions.   The patient indicates understanding of these issues and agrees with the plan.   Gabriel Earing, FNP

## 2023-08-06 NOTE — Patient Instructions (Signed)
Hematoma A hematoma is a collection of blood under the skin, in an organ, in a body space, in a joint space, or in other tissue. The blood can thicken (clot) to form a lump that you can see and feel. The lump is often firm and may become sore and tender. Most hematomas get better in a few days to weeks. However, some hematomas may be serious and require medical care. Hematomas can range from very small to very large. What are the causes? This condition is caused by: A blunt or penetrating injury. Leakage from a blood vessel under the skin. Some medical procedures, including surgeries, such as oral surgery, face lifts, and surgeries on the joints. Some medical conditions that cause bleeding or bruising. There may be multiple hematomas that appear in different areas of the body. What increases the risk? You are more likely to develop this condition if: You are an older adult. You use blood thinners. You regularly use NSAIDs, such as ibuprofen, for pain. You play contact sports. What are the signs or symptoms?  Symptoms of this condition depend on where the hematoma is located.  Common symptoms of a hematoma that is under the skin include: A firm lump on the body. Pain and tenderness in the area. Bruising. Blue, dark blue, purple-red, or yellowish skin (discoloration) may appear at the site of the hematoma if the hematoma is close to the surface of the skin. Common symptoms of a hematoma that is deep in the tissues or body spaces may be less obvious. They include: A collection of blood in the stomach (intra-abdominal hematoma). This may cause pain in the abdomen, weakness, fainting, and shortness of breath. A collection of blood in the head (intracranial hematoma). This may cause a headache or symptoms such as weakness, trouble speaking or understanding, or a change in consciousness. How is this diagnosed? This condition is diagnosed based on: Your medical history. A physical exam. Imaging  tests, such as an ultrasound or CT scan. These may be needed if your health care provider suspects a hematoma in deeper tissues or body spaces. Blood tests. These may be needed if your health care provider believes that the hematoma is caused by a medical condition. How is this treated? Treatment for this condition depends on the cause, size, and location of the hematoma. Treatment may include: Doing nothing. The majority of hematomas do not need treatment as many of them go away on their own. Surgery or close monitoring. This may be needed for large hematomas or hematomas that affect vital organs. Medicines. Medicines may be given if there is an underlying medical cause for the hematoma. Follow these instructions at home: Managing pain, stiffness, and swelling  If directed, put ice on the injured area. To do this: Put ice in a plastic bag. Place a towel between your skin and the bag. Leave the ice on for 20 minutes, 2-3 times a day for the first couple of days. If your skin turns bright red, remove the ice right away to prevent skin damage. The risk of skin damage is higher if you cannot feel pain, heat, or cold. If directed, apply heat to the affected area as often as told by your health care provider. Use the heat source that your health care provider recommends, such as a moist heat pack or a heating pad. Place a towel between your skin and the heat source. Leave the heat on for 20-30 minutes. If your skin turns bright red, remove the heat right  away to prevent burns. The risk of burns is higher if you cannot feel pain, heat, or cold. Raise (elevate) the injured area above the level of your heart while you are sitting or lying down. If directed, wrap the affected area with an elastic bandage. The bandage applies pressure (compression) to the area, which may help to reduce swelling and promote healing. Do not wrap the bandage too tightly around the affected area. If your hematoma is on a leg  or foot (lower extremity) and is painful, your health care provider may recommend crutches. Use them as told by your health care provider. General instructions Take over-the-counter and prescription medicines only as told by your health care provider. Rest the injured area as directed by your health care provider. Keep all follow-up visits. Your health care provider may want to see how your hematoma is progressing with treatment. Contact a health care provider if: You have a fever. The swelling or discoloration gets worse. You develop more hematomas. Your pain is worse or your pain is not controlled with medicine. Your skin over the hematoma breaks or starts bleeding. Get help right away if: Your hematoma is in your chest or abdomen and you have weakness, shortness of breath, or a change in consciousness. You have a hematoma on your scalp that is caused by a fall or injury, and you also have: A headache that gets worse. Trouble speaking or understanding speech. Weakness. A change in alertness or consciousness. These symptoms may be an emergency. Get help right away. Call 911. Do not wait to see if the symptoms will go away. Do not drive yourself to the hospital. This information is not intended to replace advice given to you by your health care provider. Make sure you discuss any questions you have with your health care provider. Document Revised: 06/05/2022 Document Reviewed: 06/05/2022 Elsevier Patient Education  2024 [employer].

## 2023-08-11 ENCOUNTER — Other Ambulatory Visit: Payer: Self-pay | Admitting: Family Medicine

## 2023-08-11 DIAGNOSIS — J9611 Chronic respiratory failure with hypoxia: Secondary | ICD-10-CM | POA: Diagnosis not present

## 2023-08-11 DIAGNOSIS — R0609 Other forms of dyspnea: Secondary | ICD-10-CM | POA: Diagnosis not present

## 2023-08-11 DIAGNOSIS — E1169 Type 2 diabetes mellitus with other specified complication: Secondary | ICD-10-CM

## 2023-08-15 ENCOUNTER — Ambulatory Visit (INDEPENDENT_AMBULATORY_CARE_PROVIDER_SITE_OTHER): Payer: 59

## 2023-08-15 VITALS — Ht 63.0 in | Wt 180.0 lb

## 2023-08-15 DIAGNOSIS — Z0000 Encounter for general adult medical examination without abnormal findings: Secondary | ICD-10-CM | POA: Diagnosis not present

## 2023-08-15 NOTE — Patient Instructions (Signed)
Danielle Parrish , Thank you for taking time to come for your Medicare Wellness Visit. I appreciate your ongoing commitment to your health goals. Please review the following plan we discussed and let me know if I can assist you in the future.   Referrals/Orders/Follow-Ups/Clinician Recommendations: Aim for 30 minutes of exercise or brisk walking, 6-8 glasses of water, and 5 servings of fruits and vegetables each day.   This is a list of the screening recommended for you and due dates:  Health Maintenance  Topic Date Due   COVID-19 Vaccine (5 - 2023-24 season) 08/25/2022   Eye exam for diabetics  10/01/2023*   DEXA scan (bone density measurement)  12/28/2023*   Flu Shot  03/24/2024*   Complete foot exam   09/21/2023   DTaP/Tdap/Td vaccine (2 - Td or Tdap) 10/16/2023   Yearly kidney health urinalysis for diabetes  12/28/2023   Hemoglobin A1C  12/30/2023   Mammogram  03/05/2024   Yearly kidney function blood test for diabetes  06/10/2024   Medicare Annual Wellness Visit  08/14/2024   Pneumonia Vaccine  Completed   Zoster (Shingles) Vaccine  Completed   HPV Vaccine  Aged Out   Hepatitis C Screening  Discontinued  *Topic was postponed. The date shown is not the original due date.    Advanced directives: (Provided) Advance directive discussed with you today. I have provided a copy for you to complete at home and have notarized. Once this is complete, please bring a copy in to our office so we can scan it into your chart. Information on Advanced Care Planning can be found at [facility] Advance Health Care Directives Advance Health Care Directives ([email or web address]    Next Medicare Annual Wellness Visit scheduled for next year: Yes

## 2023-08-15 NOTE — Progress Notes (Signed)
Subjective:   Danielle Parrish is a 82 y.o. female who presents for Medicare Annual (Subsequent) preventive examination.  Visit Complete: Virtual  I connected with  Danielle Parrish on 08/15/23 by a audio enabled telemedicine application and verified that I am speaking with the correct person using two identifiers.  Patient Location: Home  Provider Location: Home Office  I discussed the limitations of evaluation and management by telemedicine. The patient expressed understanding and agreed to proceed.  Patient Medicare AWV questionnaire was completed by the patient on 08/15/2023; I have confirmed that all information answered by patient is correct and no changes since this date.  Review of Systems    Vital Signs: Unable to obtain new vitals due to this being a telehealth visit.  Cardiac Risk Factors include: advanced age (>21men, >44 women);dyslipidemia;hypertension;sedentary lifestyle     Objective:    Today's Vitals   08/15/23 0804  Weight: 180 lb (81.6 kg)  Height: 5\' 3"  (1.6 m)   Body mass index is 31.89 kg/m.     08/15/2023    8:08 AM 07/12/2023    2:17 PM 06/11/2023    3:55 PM 09/13/2022    2:03 PM 08/02/2022    8:12 AM 08/01/2021    8:31 AM 05/15/2021    1:00 PM  Advanced Directives  Does Patient Have a Medical Advance Directive? No No No No No No No  Would patient like information on creating a medical advance directive? No - Patient declined    No - Patient declined No - Patient declined No - Patient declined    Current Medications (verified) Outpatient Encounter Medications as of 08/15/2023  Medication Sig   acetaminophen (TYLENOL) 500 MG tablet Take 1 tablet (500 mg total) by mouth every 6 (six) hours as needed.   albuterol (VENTOLIN HFA) 108 (90 Base) MCG/ACT inhaler Take 2 puffs eveery 6 hours as needed for wheezing or shortness of breath   amLODipine (NORVASC) 10 MG tablet TAKE 1 TABLET BY MOUTH EVERY DAY   apixaban (ELIQUIS) 5 MG TABS tablet TAKE 1 TABLET  BY MOUTH TWICE A DAY   azelastine (ASTELIN) 0.1 % nasal spray Place 1 spray into both nostrils 2 (two) times daily. Use in each nostril as directed   calcium-vitamin D (OSCAL WITH D) 500-200 MG-UNIT TABS tablet TAKE 1 TABLET BY MOUTH EVERY DAY WITH BREAKFAST   cetirizine (ZYRTEC) 10 MG tablet Take 1 tablet (10 mg total) by mouth daily.   Cholecalciferol (VITAMIN D-3 PO) Take 1 capsule by mouth daily.    cyclobenzaprine (FLEXERIL) 5 MG tablet Take 1 tablet (5 mg total) by mouth 3 (three) times daily as needed for muscle spasms.   diclofenac Sodium (VOLTAREN) 1 % GEL APPLY 4 GRAMS TOPICALLY 4 (FOUR) TIMES DAILY.   diphenhydrAMINE (BENADRYL) 25 MG tablet Take 50 mg by mouth at bedtime as needed for allergies.    Ferrous Sulfate (IRON) 325 (65 Fe) MG TABS Take 1 tablet by mouth 3 (three) times daily.   furosemide (LASIX) 20 MG tablet TAKE 1 TABLET BY MOUTH EVERY DAY   gabapentin (NEURONTIN) 300 MG capsule TAKE 1 CAPSULE BY MOUTH THREE TIMES A DAY   ketoconazole (NIZORAL) 2 % cream APPLY TO AFFECTED AREA EVERYDAY   levothyroxine (SYNTHROID) 100 MCG tablet TAKE 1 TABLET BY MOUTH EVERY DAY BEFORE BREAKFAST   lidocaine (LIDODERM) 5 % PLACE 1 PATCH ONTO THE SKIN DAILY. REMOVE & DISCARD PATCH WITHIN 12 HOURS OR AS DIRECTED BY MD   lovastatin (MEVACOR) 40  MG tablet TAKE 1 TABLET BY MOUTH EVERYDAY AT BEDTIME   metoprolol succinate (TOPROL-XL) 25 MG 24 hr tablet TAKE 1 TABLET (25 MG TOTAL) BY MOUTH DAILY.   mirabegron ER (MYRBETRIQ) 25 MG TB24 tablet Take 1 tablet (25 mg total) by mouth daily.   Multiple Vitamins-Minerals (PRESERVISION AREDS 2 PO) Take by mouth.   nystatin (MYCOSTATIN/NYSTOP) powder APPLY TO AFFECTED AREA 4 TIMES A DAY   olmesartan (BENICAR) 20 MG tablet TAKE 1 TABLET BY MOUTH EVERY DAY   omeprazole (PRILOSEC) 20 MG capsule Take 1 capsule (20 mg total) by mouth 2 (two) times daily before a meal. (Patient taking differently: Take 20 mg by mouth daily.)   polyethylene glycol powder  (GLYCOLAX/MIRALAX) 17 GM/SCOOP powder Take 17 g by mouth daily.   pyridOXINE (VITAMIN B-6) 100 MG tablet Take 100 mg by mouth daily.   sertraline (ZOLOFT) 100 MG tablet TAKE 2 TABLETS BY MOUTH EVERY DAY   sodium chloride (OCEAN) 0.65 % SOLN nasal spray Place 1 spray into both nostrils as needed for congestion.   linaclotide (LINZESS) 145 MCG CAPS capsule Take 145 mcg by mouth as needed.   No facility-administered encounter medications on file as of 08/15/2023.    Allergies (verified) Nitrofurantoin, Nsaids, Penicillins, Rosuvastatin, Statins, Vytorin [ezetimibe-simvastatin], Aspirin, and Tolmetin   History: Past Medical History:  Diagnosis Date   Anxiety    Arthritis    CHF (congestive heart failure) (HCC)    Chronic pain of multiple joints    Depression    Essential hypertension    GERD (gastroesophageal reflux disease)    Hyperlipidemia    Hyperlipidemia associated with type 2 diabetes mellitus (HCC)    Hypertension associated with type 2 diabetes mellitus (HCC)    Hypothyroidism    Mild scoliosis 10/19/2021   Osteoporosis    Sleep apnea    CPAP   Type 2 diabetes mellitus with hypercholesterolemia (HCC)    Vitamin D deficiency    Past Surgical History:  Procedure Laterality Date   ABDOMINAL HERNIA REPAIR     ABDOMINAL HYSTERECTOMY  1977   BREAST EXCISIONAL BIOPSY Right    EYE SURGERY  2020   bilateral cataract removal    KNEE ARTHROSCOPY Left 09/30/2014   Procedure: LEFT KNEE ARTHROSCOPY MEDIAL MENISECTOMY ABRASION CHONDROPLASTY SYNOVECTOMY SUPRAPATELLER CUFF;  Surgeon: Jacki Cones, MD;  Location: [facility];  Service: Orthopedics;  Laterality: Left;   Family History  Problem Relation Age of Onset   Hypertension Mother    Stroke Mother    Cancer Father    Sleep apnea Grandson    Sleep apnea Daughter    Social History   Socioeconomic History   Marital status: Widowed    Spouse name: Chanetta Marshall   Number of children: 2   Years of education: Not on file   Highest  education level: High school graduate  Occupational History   Occupation: CNA    Comment: [employer]   Occupation: retired  Tobacco Use   Smoking status: Never    Passive exposure: Past   Smokeless tobacco: Never  Vaping Use   Vaping status: Never Used  Substance and Sexual Activity   Alcohol use: No   Drug use: No   Sexual activity: Not Currently  Other Topics Concern   Not on file  Social History Narrative   Lives with her daughter   Not very active socially or physically   Social Determinants of Health   Financial Resource Strain: Low Risk  (08/15/2023)   Overall Financial  Resource Strain (CARDIA)    Difficulty of Paying Living Expenses: Not hard at all  Food Insecurity: No Food Insecurity (08/15/2023)   Hunger Vital Sign    Worried About Running Out of Food in the Last Year: Never true    Ran Out of Food in the Last Year: Never true  Transportation Needs: No Transportation Needs (08/15/2023)   PRAPARE - Administrator, Civil Service (Medical): No    Lack of Transportation (Non-Medical): No  Physical Activity: Inactive (08/15/2023)   Exercise Vital Sign    Days of Exercise per Week: 0 days    Minutes of Exercise per Session: 0 min  Stress: No Stress Concern Present (08/15/2023)   [employer] of Occupational Health - Occupational Stress Questionnaire    Feeling of Stress : Not at all  Social Connections: Socially Isolated (08/15/2023)   Social Connection and Isolation Panel [NHANES]    Frequency of Communication with Friends and Family: More than three times a week    Frequency of Social Gatherings with Friends and Family: More than three times a week    Attends Religious Services: Never    Database administrator or Organizations: No    Attends Banker Meetings: Never    Marital Status: Widowed    Tobacco Counseling Counseling given: Not Answered   Clinical Intake:  Pre-visit preparation completed: Yes  Pain : No/denies pain      Nutritional Risks: None Diabetes: No  How often do you need to have someone help you when you read instructions, pamphlets, or other written materials from your doctor or pharmacy?: 1 - Never  Interpreter Needed?: No  Information entered by :: Renie Ora, LPN   Activities of Daily Living    08/15/2023    8:08 AM  In your present state of health, do you have any difficulty performing the following activities:  Hearing? 0  Vision? 0  Difficulty concentrating or making decisions? 0  Walking or climbing stairs? 0  Dressing or bathing? 0  Doing errands, shopping? 0  Preparing Food and eating ? N  Using the Toilet? N  In the past six months, have you accidently leaked urine? N  Do you have problems with loss of bowel control? N  Managing your Medications? N  Managing your Finances? N  Housekeeping or managing your Housekeeping? N    Patient Care Team: Dettinger, Elige Radon, MD as PCP - General (Family Medicine) Marcelino Duster, MD as Referring Physician (Dermatology) Nelson Chimes, MD as Consulting Physician (Ophthalmology) Adam Phenix, DPM as Consulting Physician (Podiatry) Ranee Gosselin, MD as Consulting Physician (Orthopedic Surgery)  Indicate any recent Medical Services you may have received from other than [facility] providers in the past year (date may be approximate).     Assessment:   This is a routine wellness examination for Danielle Parrish.  Hearing/Vision screen Vision Screening - Comments:: Wears rx glasses - up to date with routine eye exams with  Dr.Lenord   Dietary issues and exercise activities discussed:     Goals Addressed             This Visit's Progress    Cut out extra servings         Depression Screen    08/15/2023    8:07 AM 08/06/2023    8:09 AM 07/16/2023    2:25 PM 07/13/2023    3:41 PM 06/29/2023   11:07 AM 03/29/2023   11:35 AM 02/28/2023    4:23 PM  PHQ  2/9 Scores  PHQ - 2 Score 0 6 0  0 2 3  PHQ- 9 Score 0 21   5 9 11   Exception  Documentation    Patient refusal       Fall Risk    08/15/2023    8:05 AM 08/06/2023    8:08 AM 07/16/2023    2:40 PM 07/16/2023    2:25 PM 07/13/2023    3:41 PM  Fall Risk   Falls in the past year? 0 1 0 0 0  Number falls in past yr: 0 1 0 0 0  Injury with Fall? 0 1 0 0 0  Risk for fall due to : No Fall Risks History of fall(s) No Fall Risks No Fall Risks;Impaired mobility No Fall Risks  Follow up Falls prevention discussed Falls evaluation completed Falls evaluation completed;Education provided Falls evaluation completed;Education provided Falls evaluation completed    MEDICARE RISK AT HOME: Medicare Risk at Home Any stairs in or around the home?: Yes If so, are there any without handrails?: No Home free of loose throw rugs in walkways, pet beds, electrical cords, etc?: Yes Adequate lighting in your home to reduce risk of falls?: Yes Life alert?: No Use of a cane, walker or w/c?: No Grab bars in the bathroom?: Yes Shower chair or bench in shower?: Yes Elevated toilet seat or a handicapped toilet?: Yes  TIMED UP AND GO:  Was the test performed?  No    Cognitive Function:    07/23/2018    8:40 AM  MMSE - Mini Mental State Exam  Orientation to time 5  Orientation to Place 5  Registration 3  Attention/ Calculation 4  Recall 2  Language- name 2 objects 2  Language- repeat 1  Language- follow 3 step command 3  Language- read & follow direction 1  Write a sentence 1  Copy design 1  Total score 28        08/15/2023    8:09 AM 08/02/2022   11:40 AM 08/01/2021    8:24 AM 07/28/2020    8:55 AM 07/28/2019    8:43 AM  6CIT Screen  What Year? 0 points 0 points 0 points 0 points 0 points  What month? 0 points 0 points 0 points 0 points 0 points  What time? 0 points 0 points 0 points 0 points 0 points  Count back from 20 0 points 0 points 0 points 0 points 2 points  Months in reverse 0 points 0 points 2 points 0 points 0 points  Repeat phrase 0 points 0 points 0 points 0 points 0  points  Total Score 0 points 0 points 2 points 0 points 2 points    Immunizations Immunization History  Administered Date(s) Administered   Fluad Quad(high Dose 65+) 09/20/2022   Influenza, High Dose Seasonal PF 11/14/2017, 10/01/2018, 12/13/2021   Influenza, Seasonal, Injecte, Preservative Fre 10/15/2013, 11/17/2014, 09/23/2015, 09/14/2016   Influenza,inj,Quad PF,6+ Mos 10/15/2013, 11/17/2014, 09/23/2015, 09/14/2016   Influenza-Unspecified 10/22/2019   Moderna Covid-19 Vaccine Bivalent Booster 48yrs & up 02/17/2022   Moderna Sars-Covid-2 Vaccination 01/17/2020, 02/14/2020, 10/29/2020   PNEUMOCOCCAL CONJUGATE-20 12/27/2022   Pneumococcal Conjugate-13 04/08/2015   Pneumococcal-Unspecified 12/26/2013   Respiratory Syncytial Virus Vaccine,Recomb Aduvanted(Arexvy) 12/05/2022   Tdap 10/15/2013   Zoster Recombinant(Shingrix) 08/06/2019, 12/02/2019   Zoster, Live 12/26/2013    TDAP status: Up to date  Flu Vaccine status: Due, Education has been provided regarding the importance of this vaccine. Advised may receive this vaccine at local pharmacy or  Health Dept. Aware to provide a copy of the vaccination record if obtained from local pharmacy or Health Dept. Verbalized acceptance and understanding.  Pneumococcal vaccine status: Up to date  Covid-19 vaccine status: Completed vaccines  Qualifies for Shingles Vaccine? Yes   Zostavax completed Yes   Shingrix Completed?: Yes  Screening Tests Health Maintenance  Topic Date Due   COVID-19 Vaccine (5 - 2023-24 season) 08/25/2022   OPHTHALMOLOGY EXAM  10/01/2023 (Originally 12/07/2022)   DEXA SCAN  12/28/2023 (Originally 10/28/2020)   INFLUENZA VACCINE  03/24/2024 (Originally 07/26/2023)   FOOT EXAM  09/21/2023   DTaP/Tdap/Td (2 - Td or Tdap) 10/16/2023   Diabetic kidney evaluation - Urine ACR  12/28/2023   HEMOGLOBIN A1C  12/30/2023   MAMMOGRAM  03/05/2024   Diabetic kidney evaluation - eGFR measurement  06/10/2024   Medicare Annual  Wellness (AWV)  08/14/2024   Pneumonia Vaccine 44+ Years old  Completed   Zoster Vaccines- Shingrix  Completed   HPV VACCINES  Aged Out   Hepatitis C Screening  Discontinued    Health Maintenance  Health Maintenance Due  Topic Date Due   COVID-19 Vaccine (5 - 2023-24 season) 08/25/2022    Colorectal cancer screening: No longer required.   Mammogram status: No longer required due to age.  Bone Density status: Ordered declined . Pt provided with contact info and advised to call to schedule appt.  Lung Cancer Screening: (Low Dose CT Chest recommended if Age 33-80 years, 20 pack-year currently smoking OR have quit w/in 15years.) does not qualify.   Lung Cancer Screening Referral: n/a  Additional Screening:  Hepatitis C Screening: does not qualify;  Vision Screening: Recommended annual ophthalmology exams for early detection of glaucoma and other disorders of the eye. Is the patient up to date with their annual eye exam?  Yes  Who is the provider or what is the name of the office in which the patient attends annual eye exams? Dr.Lenorad  If pt is not established with a provider, would they like to be referred to a provider to establish care? No .   Dental Screening: Recommended annual dental exams for proper oral hygiene   Community Resource Referral / Chronic Care Management: CRR required this visit?  No   CCM required this visit?  No     Plan:     I have personally reviewed and noted the following in the patient's chart:   Medical and social history Use of alcohol, tobacco or illicit drugs  Current medications and supplements including opioid prescriptions. Patient is not currently taking opioid prescriptions. Functional ability and status Nutritional status Physical activity Advanced directives List of other physicians Hospitalizations, surgeries, and ER visits in previous 12 months Vitals Screenings to include cognitive, depression, and falls Referrals and  appointments  In addition, I have reviewed and discussed with patient certain preventive protocols, quality metrics, and best practice recommendations. A written personalized care plan for preventive services as well as general preventive health recommendations were provided to patient.     Lorrene Reid, LPN   1/61/0960   After Visit Summary: (MyChart) Due to this being a telephonic visit, the after visit summary with patients personalized plan was offered to patient via MyChart   Nurse Notes: none

## 2023-08-22 ENCOUNTER — Telehealth: Payer: Self-pay | Admitting: Family Medicine

## 2023-08-24 NOTE — Telephone Encounter (Signed)
R/C to Patient.  Gave Patient Dermatology Appt information and also Address/Telephone Number of Office :)

## 2023-08-28 ENCOUNTER — Telehealth: Payer: Self-pay | Admitting: Family Medicine

## 2023-08-28 NOTE — Telephone Encounter (Signed)
Pt needs to follow up with PCP.   Jannifer Rodney, FNP

## 2023-08-28 NOTE — Telephone Encounter (Signed)
REFERRAL REQUEST Telephone Note  Have you been seen at our office for this problem? Wants MRI or some kind test on her legs. They are not getting any better (Advise that they may need an appointment with their PCP before a referral can be done)  Reason for Referral: . Both legs hurt but left is the worse one. Referral discussed with patient: NO  Best contact number of patient for referral team: [phone]    Has patient been seen by a specialist for this issue before: NO  Patient provider preference for referral: Whoever Dettinger thinks Patient location preference for referral: [facility]   Patient notified that referrals can take up to a week or longer to process. If they haven't heard anything within a week they should call back and speak with the referral department.

## 2023-08-29 NOTE — Telephone Encounter (Signed)
Informed pt that she is on wait list to see Dr.Dettinger. She has appt on Oct 9. Advised pt to go to er if pain gets worse

## 2023-08-29 NOTE — Telephone Encounter (Signed)
Please call pt and schedule an appt with Dettinger.   Add to cancellation list if no appt in the next month.

## 2023-08-29 NOTE — Telephone Encounter (Signed)
Will make an appointment to discuss any possible MRIs, we have to have an appointment otherwise insurance companies will not pay for MRIs.  We can discuss this then.  Or other referrals if needed

## 2023-09-05 ENCOUNTER — Encounter: Payer: Self-pay | Admitting: Dermatology

## 2023-09-05 ENCOUNTER — Ambulatory Visit (INDEPENDENT_AMBULATORY_CARE_PROVIDER_SITE_OTHER): Payer: 59 | Admitting: Dermatology

## 2023-09-05 VITALS — BP 131/77 | HR 65

## 2023-09-05 DIAGNOSIS — D0422 Carcinoma in situ of skin of left ear and external auricular canal: Secondary | ICD-10-CM

## 2023-09-05 DIAGNOSIS — D1801 Hemangioma of skin and subcutaneous tissue: Secondary | ICD-10-CM

## 2023-09-05 DIAGNOSIS — Z808 Family history of malignant neoplasm of other organs or systems: Secondary | ICD-10-CM | POA: Diagnosis not present

## 2023-09-05 DIAGNOSIS — L820 Inflamed seborrheic keratosis: Secondary | ICD-10-CM

## 2023-09-05 DIAGNOSIS — L578 Other skin changes due to chronic exposure to nonionizing radiation: Secondary | ICD-10-CM | POA: Diagnosis not present

## 2023-09-05 DIAGNOSIS — L814 Other melanin hyperpigmentation: Secondary | ICD-10-CM | POA: Diagnosis not present

## 2023-09-05 DIAGNOSIS — D0462 Carcinoma in situ of skin of left upper limb, including shoulder: Secondary | ICD-10-CM

## 2023-09-05 DIAGNOSIS — C4492 Squamous cell carcinoma of skin, unspecified: Secondary | ICD-10-CM

## 2023-09-05 DIAGNOSIS — C44629 Squamous cell carcinoma of skin of left upper limb, including shoulder: Secondary | ICD-10-CM

## 2023-09-05 DIAGNOSIS — Z1283 Encounter for screening for malignant neoplasm of skin: Secondary | ICD-10-CM

## 2023-09-05 DIAGNOSIS — L821 Other seborrheic keratosis: Secondary | ICD-10-CM | POA: Diagnosis not present

## 2023-09-05 DIAGNOSIS — W908XXA Exposure to other nonionizing radiation, initial encounter: Secondary | ICD-10-CM | POA: Diagnosis not present

## 2023-09-05 DIAGNOSIS — L570 Actinic keratosis: Secondary | ICD-10-CM

## 2023-09-05 DIAGNOSIS — D229 Melanocytic nevi, unspecified: Secondary | ICD-10-CM

## 2023-09-05 DIAGNOSIS — D485 Neoplasm of uncertain behavior of skin: Secondary | ICD-10-CM

## 2023-09-05 HISTORY — DX: Squamous cell carcinoma of skin, unspecified: C44.92

## 2023-09-05 HISTORY — DX: Squamous cell carcinoma of skin of left upper limb, including shoulder: C44.629

## 2023-09-05 NOTE — Patient Instructions (Addendum)
Cryotherapy Aftercare  Wash gently with soap and water everyday.   Apply Vaseline and Band-Aid daily until healed.   Patient Handout: Wound Care for Skin Biopsy Site  Taking Care of Your Skin Biopsy Site  Proper care of the biopsy site is essential for promoting healing and minimizing scarring. This handout provides instructions on how to care for your biopsy site to ensure optimal recovery.  1. Cleaning the Wound:  Clean the biopsy site daily with gentle soap and water. Gently pat the area dry with a clean, soft towel. Avoid harsh scrubbing or rubbing the area, as this can irritate the skin and delay healing.  2. Applying Aquaphor and Bandage:  After cleaning the wound, apply a thin layer of Aquaphor ointment to the biopsy site. Cover the area with a sterile bandage to protect it from dirt, bacteria, and friction. Change the bandage daily or as needed if it becomes soiled or wet.  3. Continued Care for One Week:  Repeat the cleaning, Aquaphor application, and bandaging process daily for one week following the biopsy procedure. Keeping the wound clean and moist during this initial healing period will help prevent infection and promote optimal healing.  4. Massaging Aquaphor into the Area:  ---After one week, discontinue the use of bandages but continue to apply Aquaphor to the biopsy site. ----Gently massage the Aquaphor into the area using circular motions. ---Massaging the skin helps to promote circulation and prevent the formation of scar tissue.   Additional Tips:  Avoid exposing the biopsy site to direct sunlight during the healing process, as this can cause hyperpigmentation or worsen scarring. If you experience any signs of infection, such as increased redness, swelling, warmth, or drainage from the wound, contact your healthcare provider immediately. Follow any additional instructions provided by your healthcare provider for caring for the biopsy site and managing any  discomfort. Conclusion:  Taking proper care of your skin biopsy site is crucial for ensuring optimal healing and minimizing scarring. By following these instructions for cleaning, applying Aquaphor, and massaging the area, you can promote a smooth and successful recovery. If you have any questions or concerns about caring for your biopsy site, don't hesitate to contact your healthcare provider for guidance.    Important Information  Due to recent changes in healthcare laws, you may see results of your pathology and/or laboratory studies on MyChart before the doctors have had a chance to review them. We understand that in some cases there may be results that are confusing or concerning to you. Please understand that not all results are received at the same time and often the doctors may need to interpret multiple results in order to provide you with the best plan of care or course of treatment. Therefore, we ask that you please give [place] 2 business days to thoroughly review all your results before contacting the office for clarification. Should we see a critical lab result, you will be contacted sooner.   If You Need Anything After Your Visit  If you have any questions or concerns for your doctor, please call our main line at [phone] If no one answers, please leave a voicemail as directed and we will return your call as soon as possible. Messages left after 4 pm will be answered the following business day.   You may also send [place] a message via MyChart. We typically respond to MyChart messages within 1-2 business days.  For prescription refills, please ask your pharmacy to contact our office. Our fax number  is [phone].  If you have an urgent issue when the clinic is closed that cannot wait until the next business day, you can page your doctor at the number below.    Please note that while we do our best to be available for urgent issues outside of office hours, we are not available 24/7.    If you have an urgent issue and are unable to reach [place], you may choose to seek medical care at your doctor's office, retail clinic, urgent care center, or emergency room.  If you have a medical emergency, please immediately call 911 or go to the emergency department. In the event of inclement weather, please call our main line at [phone] for an update on the status of any delays or closures.  Dermatology Medication Tips: Please keep the boxes that topical medications come in in order to help keep track of the instructions about where and how to use these. Pharmacies typically print the medication instructions only on the boxes and not directly on the medication tubes.   If your medication is too expensive, please contact our office at [phone] or send [place] a message through MyChart.   We are unable to tell what your co-pay for medications will be in advance as this is different depending on your insurance coverage. However, we may be able to find a substitute medication at lower cost or fill out paperwork to get insurance to cover a needed medication.   If a prior authorization is required to get your medication covered by your insurance company, please allow [place] 1-2 business days to complete this process.  Drug prices often vary depending on where the prescription is filled and some pharmacies may offer cheaper prices.  The website www.goodrx.com contains coupons for medications through different pharmacies. The prices here do not account for what the cost may be with help from insurance (it may be cheaper with your insurance), but the website can give you the price if you did not use any insurance.  - You can print the associated coupon and take it with your prescription to the pharmacy.  - You may also stop by our office during regular business hours and pick up a GoodRx coupon card.  - If you need your prescription sent electronically to a different pharmacy, notify our office  through [facility] or by phone at [phone]

## 2023-09-05 NOTE — Progress Notes (Unsigned)
New Patient Visit   Subjective  Danielle Parrish is a 82 y.o. female who presents for the following: Skin Cancer Screening and Full Body Skin Exam  The patient presents for Total-Body Skin Exam (TBSE) for skin cancer screening and mole check. The patient has spots, moles and lesions to be evaluated, some may be new or changing and the patient may have concern these could be cancer.  Pt has no hx of skin cancer but family hx --father died of MM. Pt hasn't had skin check in several years.   The following portions of the chart were reviewed this encounter and updated as appropriate: medications, allergies, medical history  Review of Systems:  No other skin or systemic complaints except as noted in HPI or Assessment and Plan.  Objective  Well appearing patient in no apparent distress; mood and affect are within normal limits.  A full examination was performed including scalp, head, eyes, ears, nose, lips, neck, chest, axillae, abdomen, back, buttocks, bilateral upper extremities, bilateral lower extremities, hands, feet, fingers, toes, fingernails, and toenails. All findings within normal limits unless otherwise noted below.   Relevant physical exam findings are noted in the Assessment and Plan.  Left Ear Crusted macule       Left Wrist - Posterior Irregular brown macule       Left Flank Irregular brown macule         Assessment & Plan   SKIN CANCER SCREENING PERFORMED TODAY.  ACTINIC DAMAGE - Chronic condition, secondary to cumulative UV/sun exposure - diffuse scaly erythematous macules with underlying dyspigmentation - Recommend daily broad spectrum sunscreen SPF 30+ to sun-exposed areas, reapply every 2 hours as needed.  - Staying in the shade or wearing long sleeves, sun glasses (UVA+UVB protection) and wide brim hats (4-inch brim around the entire circumference of the hat) are also recommended for sun protection.  - Call for new or changing  lesions.  MELANOCYTIC NEVI - Tan-brown and/or pink-flesh-colored symmetric macules and papules - Benign appearing on exam today - Observation - Call clinic for new or changing moles - Recommend daily use of broad spectrum spf 30+ sunscreen to sun-exposed areas.   SEBORRHEIC KERATOSIS - Stuck-on, waxy, tan-brown papules and/or plaques  - Benign-appearing - Discussed benign etiology and prognosis. - Observe - Call for any changes  LENTIGINES Exam: scattered tan macules Due to sun exposure Treatment Plan: Benign-appearing, observe. Recommend daily broad spectrum sunscreen SPF 30+ to sun-exposed areas, reapply every 2 hours as needed.  Call for any changes    HEMANGIOMA Exam: red papule(s) Discussed benign nature. Recommend observation. Call for changes.      Neoplasm of uncertain behavior of skin (3) Left Ear  Skin / nail biopsy Type of biopsy: tangential   Instrument used: DermaBlade    Specimen 1 - Surgical pathology Differential Diagnosis: R/O NMSC  Check Margins: No  Left Wrist - Posterior  Skin / nail biopsy Type of biopsy: tangential   Timeout: patient name, date of birth, surgical site, and procedure verified   Instrument used: DermaBlade   Post-procedure details: wound care instructions given    Specimen 2 - Surgical pathology Differential Diagnosis: R/O NMSC  Check Margins: No  Left Flank  Skin / nail biopsy Type of biopsy: tangential   Timeout: patient name, date of birth, surgical site, and procedure verified   Instrument used: DermaBlade   Post-procedure details: wound care instructions given    Specimen 3 - Surgical pathology Differential Diagnosis: R/O NMSC  Check Margins:  No  AK (actinic keratosis) (5) Left Forearm , right hand, right cheek    No follow-ups on file.  I, Tillie Fantasia, CMA, am acting as scribe for Gwenith Daily, MD.   Documentation: I have reviewed the above documentation for accuracy and completeness, and I agree  with the above.  Gwenith Daily, MD

## 2023-09-06 ENCOUNTER — Telehealth: Payer: Self-pay

## 2023-09-06 NOTE — Telephone Encounter (Signed)
Pt called to say bx was bleeding even after pressure. She said she didn't hold for 30 min w/o peeking. I told her, per dr. Caralyn Guile hold pressure for 30 min with a bag of frozen peas w/o peeking and if she continued bleeding, repeat. If it STILL continued, go to urgent care.

## 2023-09-07 ENCOUNTER — Encounter: Payer: Self-pay | Admitting: Internal Medicine

## 2023-09-07 ENCOUNTER — Other Ambulatory Visit: Payer: Self-pay | Admitting: Family Medicine

## 2023-09-07 ENCOUNTER — Ambulatory Visit: Payer: 59 | Attending: Internal Medicine | Admitting: Internal Medicine

## 2023-09-07 VITALS — BP 150/84 | HR 52 | Ht 62.0 in | Wt 175.8 lb

## 2023-09-07 DIAGNOSIS — I480 Paroxysmal atrial fibrillation: Secondary | ICD-10-CM

## 2023-09-07 DIAGNOSIS — Z7901 Long term (current) use of anticoagulants: Secondary | ICD-10-CM | POA: Insufficient documentation

## 2023-09-07 DIAGNOSIS — E1159 Type 2 diabetes mellitus with other circulatory complications: Secondary | ICD-10-CM

## 2023-09-07 DIAGNOSIS — Z79899 Other long term (current) drug therapy: Secondary | ICD-10-CM | POA: Diagnosis not present

## 2023-09-07 DIAGNOSIS — B372 Candidiasis of skin and nail: Secondary | ICD-10-CM

## 2023-09-07 DIAGNOSIS — F339 Major depressive disorder, recurrent, unspecified: Secondary | ICD-10-CM

## 2023-09-07 LAB — SURGICAL PATHOLOGY

## 2023-09-07 MED ORDER — FUROSEMIDE 40 MG PO TABS: 40.0000 mg | ORAL_TABLET | Freq: Every day | ORAL | 1 refills | Status: DC

## 2023-09-07 NOTE — Progress Notes (Signed)
Cardiology Office Note  Date: 09/07/2023   ID: Danielle Parrish, DOB 01/24/1941, MRN [id number]  PCP:  Dettinger, Elige Radon, MD  Cardiologist:  Marjo Bicker, MD Electrophysiologist:  None   History of Present Illness: Danielle Parrish is a 82 y.o. female known to have paroxysmal A-fib, chronic hypoxic respiratory failure on oxygen, OSA on CPAP, HTN, DM 2, HLD was referred to cardiology clinic for management of A-fib.  Patient was newly diagnosed with A-fib in 05/2023 when she presented to her pulmonologist office visit. She was sent to ER where she spontaneously converted to NSR.  She was then started on rate controlling agents, Eliquis and sent to A-fib clinic for further management.  Patient denies having any interval palpitations, dizziness/lightheadedness, presyncope and syncope.  No angina.  She has DOE.  Currently on home oxygen.  Echocardiogram from 7/23 showed normal LVEF, G1 DD, RV function is low normal, normal PASP, mild aortic stenosis and mild AR.   Past Medical History:  Diagnosis Date   Anxiety    Arthritis    CHF (congestive heart failure) (HCC)    Chronic pain of multiple joints    Depression    Essential hypertension    GERD (gastroesophageal reflux disease)    Hyperlipidemia    Hyperlipidemia associated with type 2 diabetes mellitus (HCC)    Hypertension associated with type 2 diabetes mellitus (HCC)    Hypothyroidism    Mild scoliosis 10/19/2021   Osteoporosis    Sleep apnea    CPAP   Type 2 diabetes mellitus with hypercholesterolemia (HCC)    Vitamin D deficiency     Past Surgical History:  Procedure Laterality Date   ABDOMINAL HERNIA REPAIR     ABDOMINAL HYSTERECTOMY  1977   BREAST EXCISIONAL BIOPSY Right    EYE SURGERY  2020   bilateral cataract removal    KNEE ARTHROSCOPY Left 09/30/2014   Procedure: LEFT KNEE ARTHROSCOPY MEDIAL MENISECTOMY ABRASION CHONDROPLASTY SYNOVECTOMY SUPRAPATELLER CUFF;  Surgeon: Jacki Cones, MD;  Location:  [facility];  Service: Orthopedics;  Laterality: Left;    Current Outpatient Medications  Medication Sig Dispense Refill   acetaminophen (TYLENOL) 500 MG tablet Take 1 tablet (500 mg total) by mouth every 6 (six) hours as needed. 30 tablet 0   albuterol (VENTOLIN HFA) 108 (90 Base) MCG/ACT inhaler Take 2 puffs eveery 6 hours as needed for wheezing or shortness of breath 6.7 each 3   amLODipine (NORVASC) 10 MG tablet TAKE 1 TABLET BY MOUTH EVERY DAY 90 tablet 0   apixaban (ELIQUIS) 5 MG TABS tablet TAKE 1 TABLET BY MOUTH TWICE A DAY 180 tablet 0   azelastine (ASTELIN) 0.1 % nasal spray Place 1 spray into both nostrils 2 (two) times daily. Use in each nostril as directed 30 mL 12   calcium-vitamin D (OSCAL WITH D) 500-200 MG-UNIT TABS tablet TAKE 1 TABLET BY MOUTH EVERY DAY WITH BREAKFAST 90 tablet 1   cetirizine (ZYRTEC) 10 MG tablet Take 1 tablet (10 mg total) by mouth daily. 30 tablet 11   Cholecalciferol (VITAMIN D-3 PO) Take 1 capsule by mouth daily.      cyclobenzaprine (FLEXERIL) 5 MG tablet Take 1 tablet (5 mg total) by mouth 3 (three) times daily as needed for muscle spasms. 30 tablet 1   diclofenac Sodium (VOLTAREN) 1 % GEL APPLY 4 GRAMS TOPICALLY 4 (FOUR) TIMES DAILY. 400 g 1   diphenhydrAMINE (BENADRYL) 25 MG tablet Take 50 mg by mouth at bedtime as needed  for allergies.      Ferrous Sulfate (IRON) 325 (65 Fe) MG TABS Take 1 tablet by mouth 3 (three) times daily.     furosemide (LASIX) 40 MG tablet Take 1 tablet (40 mg total) by mouth daily. 90 tablet 1   gabapentin (NEURONTIN) 300 MG capsule TAKE 1 CAPSULE BY MOUTH THREE TIMES A DAY 90 capsule 0   ketoconazole (NIZORAL) 2 % cream APPLY TO AFFECTED AREA EVERYDAY 60 g 0   levothyroxine (SYNTHROID) 100 MCG tablet TAKE 1 TABLET BY MOUTH EVERY DAY BEFORE BREAKFAST 90 tablet 2   lidocaine (LIDODERM) 5 % PLACE 1 PATCH ONTO THE SKIN DAILY. REMOVE & DISCARD PATCH WITHIN 12 HOURS OR AS DIRECTED BY MD 30 patch 5   linaclotide (LINZESS) 145 MCG CAPS  capsule Take 145 mcg by mouth as needed.     lovastatin (MEVACOR) 40 MG tablet TAKE 1 TABLET BY MOUTH EVERYDAY AT BEDTIME 90 tablet 0   metoprolol succinate (TOPROL-XL) 25 MG 24 hr tablet TAKE 1 TABLET (25 MG TOTAL) BY MOUTH DAILY. 90 tablet 0   mirabegron ER (MYRBETRIQ) 25 MG TB24 tablet Take 1 tablet (25 mg total) by mouth daily. 30 tablet 3   Multiple Vitamins-Minerals (PRESERVISION AREDS 2 PO) Take by mouth.     olmesartan (BENICAR) 20 MG tablet TAKE 1 TABLET BY MOUTH EVERY DAY 90 tablet 1   omeprazole (PRILOSEC) 20 MG capsule Take 1 capsule (20 mg total) by mouth 2 (two) times daily before a meal. (Patient taking differently: Take 20 mg by mouth daily.) 180 capsule 3   polyethylene glycol powder (GLYCOLAX/MIRALAX) 17 GM/SCOOP powder Take 17 g by mouth daily. 3350 g 1   pyridOXINE (VITAMIN B-6) 100 MG tablet Take 100 mg by mouth daily.     sertraline (ZOLOFT) 100 MG tablet TAKE 2 TABLETS BY MOUTH EVERY DAY 180 tablet 0   sodium chloride (OCEAN) 0.65 % SOLN nasal spray Place 1 spray into both nostrils as needed for congestion. 1 Bottle prn   nystatin (MYCOSTATIN/NYSTOP) powder APPLY TO AFFECTED AREA 4 TIMES A DAY 60 g 0   No current facility-administered medications for this visit.   Allergies:  Nitrofurantoin, Nsaids, Penicillins, Rosuvastatin, Statins, Vytorin [ezetimibe-simvastatin], Aspirin, and Tolmetin   Social History: The patient  reports that she has never smoked. She has been exposed to tobacco smoke. She has never used smokeless tobacco. She reports that she does not drink alcohol and does not use drugs.   Family History: The patient's family history includes Cancer in her father; Hypertension in her mother; Sleep apnea in her daughter and grandson; Stroke in her mother.   ROS:  Please see the history of present illness. Otherwise, complete review of systems is positive for none  All other systems are reviewed and negative.   Physical Exam: VS:  BP (!) 150/84   Pulse (!) 52    Ht 5\' 2"  (1.575 m)   Wt 175 lb 12.8 oz (79.7 kg)   SpO2 92% Comment: 4 l/min  BMI 32.15 kg/m , BMI Body mass index is 32.15 kg/m.  Wt Readings from Last 3 Encounters:  09/07/23 175 lb 12.8 oz (79.7 kg)  08/15/23 180 lb (81.6 kg)  08/06/23 179 lb (81.2 kg)    General: Patient appears comfortable at rest. HEENT: Conjunctiva and lids normal, oropharynx clear with moist mucosa. Neck: Supple, no elevated JVP or carotid bruits, no thyromegaly. Lungs: Clear to auscultation, nonlabored breathing at rest. Cardiac: Regular rate and rhythm, no S3 or significant  systolic murmur, no pericardial rub. Abdomen: Soft, nontender, no hepatomegaly, bowel sounds present, no guarding or rebound. Extremities: No pitting edema, distal pulses 2+. Skin: Warm and dry. Musculoskeletal: No kyphosis. Neuropsychiatric: Alert and oriented x3, affect grossly appropriate.  Recent Labwork: 12/27/2022: TSH 0.606 02/06/2023: BNP 29.1 06/11/2023: ALT 27; AST 16; BUN 22; Creatinine, Ser 0.73; Hemoglobin 14.7; Platelets 202; Potassium 4.2; Sodium 134     Component Value Date/Time   CHOL 161 03/29/2023 1137   CHOL 150 05/15/2013 0941   TRIG 156 (H) 03/29/2023 1137   TRIG 135 01/08/2015 1026   TRIG 123 05/15/2013 0941   HDL 62 03/29/2023 1137   HDL 47 01/08/2015 1026   HDL 48 05/15/2013 0941   CHOLHDL 2.6 03/29/2023 1137   LDLCALC 73 03/29/2023 1137   LDLCALC 60 09/18/2013 0849   LDLCALC 77 05/15/2013 0941     Assessment and Plan:   Paroxysmal atrial fibrillation: New diagnosis in 05/2023.  Presented to pulmonology's office with A-fib with RVR, spontaneous converted to normal sinus rhythm in the ER.  Normal sinus rhythm since then.  Obtain 2-week event monitor to rule out any intermittent episodes of atrial fibrillation.  Continue metoprolol succinate 25 mg once daily and Eliquis 5 mg twice daily.  Obtain thyroid panel, TSH and free T3/T4.  Continue CPAP every night.  Chronic diastolic heart failure: Symptomatic  with DOE.  Previously taking Lasix 20 mg once daily which I will increase to 40 mg once daily.  Obtain BMP in 5 days.  HTN, controlled: Continue amlodipine 10 mg once daily, follows with PCP.  Goal BP less than 140 to 150 mmHg SBP.  HLD, at goal: Continue lovastatin 40 mg nightly, goal LDL less than 100.  Follows with PCP.      Medication Adjustments/Labs and Tests Ordered: Current medicines are reviewed at length with the patient today.  Concerns regarding medicines are outlined above.    Disposition:  Follow up  3 months  Signed Ameila Weldon Verne Spurr, MD, 09/07/2023 4:10 PM    [facility] at [facility] [address] [place], [place], [place] [address]

## 2023-09-07 NOTE — Patient Instructions (Addendum)
Medication Instructions:  Your physician has recommended you make the following change in your medication:  Increase Lasix from 20 mg to 40 mg once daily Continue taking all other medications as prescribed  Labwork: BMET in a week at [facility] Thyroid Panel, T3, T4  Testing/Procedures: None  Follow-Up: Your physician recommends that you schedule a follow-up appointment in: 3 months  Any Other Special Instructions Will Be Listed Below (If Applicable).  If you need a refill on your cardiac medications before your next appointment, please call your pharmacy.

## 2023-09-10 DIAGNOSIS — M25562 Pain in left knee: Secondary | ICD-10-CM | POA: Diagnosis not present

## 2023-09-10 DIAGNOSIS — M79662 Pain in left lower leg: Secondary | ICD-10-CM | POA: Diagnosis not present

## 2023-09-11 DIAGNOSIS — R0609 Other forms of dyspnea: Secondary | ICD-10-CM | POA: Diagnosis not present

## 2023-09-11 DIAGNOSIS — J9611 Chronic respiratory failure with hypoxia: Secondary | ICD-10-CM | POA: Diagnosis not present

## 2023-09-12 ENCOUNTER — Telehealth: Payer: Self-pay

## 2023-09-12 NOTE — Telephone Encounter (Signed)
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

## 2023-09-12 NOTE — Telephone Encounter (Signed)
Spoke with pt and gave her bx results. Explained to her she needed mohs for ear and an excision on her wrist

## 2023-09-14 DIAGNOSIS — I480 Paroxysmal atrial fibrillation: Secondary | ICD-10-CM | POA: Diagnosis not present

## 2023-09-15 LAB — BASIC METABOLIC PANEL
BUN/Creatinine Ratio: 26 (ref 12–28)
BUN: 20 mg/dL (ref 8–27)
CO2: 26 mmol/L (ref 20–29)
Calcium: 10.6 mg/dL — ABNORMAL HIGH (ref 8.7–10.3)
Chloride: 102 mmol/L (ref 96–106)
Creatinine, Ser: 0.77 mg/dL (ref 0.57–1.00)
Glucose: 101 mg/dL — ABNORMAL HIGH (ref 70–99)
Potassium: 4.9 mmol/L (ref 3.5–5.2)
Sodium: 141 mmol/L (ref 134–144)
eGFR: 77 mL/min/{1.73_m2} (ref >59)

## 2023-09-15 LAB — THYROID PANEL WITH TSH
Free Thyroxine Index: 2 (ref 1.2–4.9)
T3 Uptake Ratio: 30 % (ref 24–39)
T4, Total: 6.6 ug/dL (ref 4.5–12.0)
TSH: 1.23 u[IU]/mL (ref 0.450–4.500)

## 2023-09-17 ENCOUNTER — Other Ambulatory Visit: Payer: Self-pay

## 2023-09-19 ENCOUNTER — Telehealth: Payer: Self-pay

## 2023-09-19 NOTE — Telephone Encounter (Signed)
-----   Message from Vishnu P Mallipeddi sent at 09/19/2023  8:20 AM EDT ----- Normal thyroid function test, normal creatinine.  Continue p.o. Lasix 40 mg once daily.

## 2023-09-19 NOTE — Telephone Encounter (Signed)
Patient informed and verbalized understanding of plan. 

## 2023-09-20 ENCOUNTER — Ambulatory Visit: Payer: Medicare Other | Admitting: Adult Health

## 2023-09-25 ENCOUNTER — Encounter: Payer: Self-pay | Admitting: Adult Health

## 2023-09-25 ENCOUNTER — Ambulatory Visit (INDEPENDENT_AMBULATORY_CARE_PROVIDER_SITE_OTHER): Payer: 59 | Admitting: Adult Health

## 2023-09-25 VITALS — BP 111/57 | HR 51 | Ht 62.0 in | Wt 177.0 lb

## 2023-09-25 DIAGNOSIS — Z789 Other specified health status: Secondary | ICD-10-CM

## 2023-09-25 DIAGNOSIS — G4733 Obstructive sleep apnea (adult) (pediatric): Secondary | ICD-10-CM

## 2023-09-25 NOTE — Patient Instructions (Signed)
Continue using CPAP nightly and greater than 4 hours each night If oxygen level stays low- then make sure you let pulmonology know. If your symptoms worsen or you develop new symptoms please let us know.

## 2023-09-25 NOTE — Progress Notes (Signed)
PATIENT: Danielle Parrish DOB: January 19, 1941  REASON FOR VISIT: follow up HISTORY FROM: patient  Chief Complaint  Patient presents with   Follow-up    Rm 19, alone.  Has trouble keeping o2 level up.  (Bleeds into cpap).   2 new meds lasix and eliquis from cardiology.      HISTORY OF PRESENT ILLNESS: Today 09/25/23:  Danielle Parrish is a 82 y.o. female with a history of OSA on CPAP. Returns today for follow-up.  She reports that the CPAP is working well.  She states occasionally her oxygen level during the day will drop down to the 80s but it will pop back up to the 90s.  Pulmonology is prescribing her oxygen.  She does wear supplemental oxygen at night.  Needs new tubing.  Download is below       09/19/22: Danielle Parrish is an 82 year old female with a history of obstructive sleep apnea on CPAP.  She returns today for follow-up.  She states that the CPAP is working well for her.  She is still struggling with fatigue but has saw pulmonology and is currently in physical therapy.  She feels that so far has been helpful.  Her download is below.  She does have supplemental O2 bled into her CPAP     10/25/21: Danielle Parrish is an 82 year old female with a history of obstructive sleep apnea on CPAP.  She returns today for follow-up.  She reports that she is now on nocturnal oxygen.  She states that she did not feel like it was working right with her CPAP so she stopped using the CPAP machine in the last 2 weeks.  Her download is below      REVIEW OF SYSTEMS: Out of a complete 14 system review of symptoms, the patient complains only of the following symptoms, and all other reviewed systems are negative.  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    Penicillins Hives and Other (See Comments)    Has patient had a PCN reaction causing immediate rash,  facial/tongue/throat swelling, SOB or lightheadedness with hypotension: Yes  Has patient had a PCN reaction causing severe rash involving mucus membranes or skin necrosis: unknown  Has patient had a PCN reaction that required hospitalization No  Has patient had a PCN reaction occurring within the last 10 years: No  If all of the above answers are "NO", then may proceed with Cephalosporin use.   Rosuvastatin Other (See Comments)    Body aches.   Statins Other (See Comments)   Vytorin [Ezetimibe-Simvastatin]     Body aches.    Aspirin Nausea Only   Tolmetin Nausea Only    Burns stomach.     HOME MEDICATIONS: Outpatient Medications Prior to Visit  Medication Sig Dispense Refill   acetaminophen (TYLENOL) 500 MG tablet Take 1 tablet (500 mg total) by mouth every 6 (six) hours as needed. 30 tablet 0   albuterol (VENTOLIN HFA) 108 (90 Base) MCG/ACT inhaler Take 2 puffs eveery 6 hours as needed for wheezing or shortness of breath 6.7 each 3   amLODipine (NORVASC) 10 MG tablet TAKE 1 TABLET BY MOUTH EVERY DAY 90 tablet 0   apixaban (ELIQUIS) 5 MG TABS tablet TAKE 1 TABLET BY MOUTH TWICE A DAY 180 tablet 0   azelastine (ASTELIN) 0.1 % nasal spray Place 1  spray into both nostrils 2 (two) times daily. Use in each nostril as directed 30 mL 12   calcium-vitamin D (OSCAL WITH D) 500-200 MG-UNIT TABS tablet TAKE 1 TABLET BY MOUTH EVERY DAY WITH BREAKFAST 90 tablet 1   cetirizine (ZYRTEC) 10 MG tablet Take 1 tablet (10 mg total) by mouth daily. 30 tablet 11   Cholecalciferol (VITAMIN D-3 PO) Take 1 capsule by mouth daily.      cyclobenzaprine (FLEXERIL) 5 MG tablet Take 1 tablet (5 mg total) by mouth 3 (three) times daily as needed for muscle spasms. 30 tablet 1   diclofenac Sodium (VOLTAREN) 1 % GEL APPLY 4 GRAMS TOPICALLY 4 (FOUR) TIMES DAILY. 400 g 1   diphenhydrAMINE (BENADRYL) 25 MG tablet Take 50 mg by mouth at bedtime as needed for allergies.      Ferrous Sulfate (IRON) 325 (65 Fe) MG TABS  Take 1 tablet by mouth 3 (three) times daily.     furosemide (LASIX) 40 MG tablet Take 1 tablet (40 mg total) by mouth daily. 90 tablet 1   gabapentin (NEURONTIN) 300 MG capsule TAKE 1 CAPSULE BY MOUTH THREE TIMES A DAY 90 capsule 0   ketoconazole (NIZORAL) 2 % cream APPLY TO AFFECTED AREA EVERYDAY 60 g 0   levothyroxine (SYNTHROID) 100 MCG tablet TAKE 1 TABLET BY MOUTH EVERY DAY BEFORE BREAKFAST 90 tablet 2   lidocaine (LIDODERM) 5 % PLACE 1 PATCH ONTO THE SKIN DAILY. REMOVE & DISCARD PATCH WITHIN 12 HOURS OR AS DIRECTED BY MD 30 patch 5   linaclotide (LINZESS) 145 MCG CAPS capsule Take 145 mcg by mouth as needed.     lovastatin (MEVACOR) 40 MG tablet TAKE 1 TABLET BY MOUTH EVERYDAY AT BEDTIME 90 tablet 0   metoprolol succinate (TOPROL-XL) 25 MG 24 hr tablet TAKE 1 TABLET (25 MG TOTAL) BY MOUTH DAILY. 90 tablet 0   mirabegron ER (MYRBETRIQ) 25 MG TB24 tablet Take 1 tablet (25 mg total) by mouth daily. 30 tablet 3   Multiple Vitamins-Minerals (PRESERVISION AREDS 2 PO) Take by mouth.     nystatin (MYCOSTATIN/NYSTOP) powder APPLY TO AFFECTED AREA 4 TIMES A DAY 60 g 0   olmesartan (BENICAR) 20 MG tablet TAKE 1 TABLET BY MOUTH EVERY DAY 90 tablet 1   omeprazole (PRILOSEC) 20 MG capsule Take 1 capsule (20 mg total) by mouth 2 (two) times daily before a meal. (Patient taking differently: Take 20 mg by mouth daily.) 180 capsule 3   polyethylene glycol powder (GLYCOLAX/MIRALAX) 17 GM/SCOOP powder Take 17 g by mouth daily. 3350 g 1   pyridOXINE (VITAMIN B-6) 100 MG tablet Take 100 mg by mouth daily.     sertraline (ZOLOFT) 100 MG tablet TAKE 2 TABLETS BY MOUTH EVERY DAY 180 tablet 0   sodium chloride (OCEAN) 0.65 % SOLN nasal spray Place 1 spray into both nostrils as needed for congestion. 1 Bottle prn   No facility-administered medications prior to visit.    PAST MEDICAL HISTORY: Past Medical History:  Diagnosis Date   Anxiety    Arthritis    CHF (congestive heart failure) (HCC)    Chronic pain  of multiple joints    Depression    Essential hypertension    GERD (gastroesophageal reflux disease)    Hyperlipidemia    Hyperlipidemia associated with type 2 diabetes mellitus (HCC)    Hypertension associated with type 2 diabetes mellitus (HCC)    Hypothyroidism    Mild scoliosis 10/19/2021   Osteoporosis    Sleep apnea  CPAP   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, left 09/05/2023   left wrist   needs excision   Squamous cell carcinoma of skin 09/05/2023   left ear   needs mohs   Type 2 diabetes mellitus with hypercholesterolemia (HCC)    Vitamin D deficiency     PAST SURGICAL HISTORY: Past Surgical History:  Procedure Laterality Date   ABDOMINAL HERNIA REPAIR     ABDOMINAL HYSTERECTOMY  1977   BREAST EXCISIONAL BIOPSY Right    EYE SURGERY  2020   bilateral cataract removal    KNEE ARTHROSCOPY Left 09/30/2014   Procedure: LEFT KNEE ARTHROSCOPY MEDIAL MENISECTOMY ABRASION CHONDROPLASTY SYNOVECTOMY SUPRAPATELLER CUFF;  Surgeon: Jacki Cones, MD;  Location: [facility];  Service: Orthopedics;  Laterality: Left;    FAMILY HISTORY: Family History  Problem Relation Age of Onset   Hypertension Mother    Stroke Mother    Cancer Father    Sleep apnea Grandson    Sleep apnea Daughter     SOCIAL HISTORY: Social History   Socioeconomic History   Marital status: Widowed    Spouse name: Chanetta Marshall   Number of children: 2   Years of education: Not on file   Highest education level: High school graduate  Occupational History   Occupation: CNA    Comment: [employer]   Occupation: retired  Tobacco Use   Smoking status: Never    Passive exposure: Past   Smokeless tobacco: Never  Vaping Use   Vaping status: Never Used  Substance and Sexual Activity   Alcohol use: No   Drug use: No   Sexual activity: Not Currently  Other Topics Concern   Not on file  Social History Narrative   Lives with her daughter   Not very active socially or physically   Social Determinants of  Health   Financial Resource Strain: Low Risk  (08/15/2023)   Overall Financial Resource Strain (CARDIA)    Difficulty of Paying Living Expenses: Not hard at all  Food Insecurity: No Food Insecurity (08/15/2023)   Hunger Vital Sign    Worried About Running Out of Food in the Last Year: Never true    Ran Out of Food in the Last Year: Never true  Transportation Needs: No Transportation Needs (08/15/2023)   PRAPARE - Administrator, Civil Service (Medical): No    Lack of Transportation (Non-Medical): No  Physical Activity: Inactive (08/15/2023)   Exercise Vital Sign    Days of Exercise per Week: 0 days    Minutes of Exercise per Session: 0 min  Stress: No Stress Concern Present (08/15/2023)   [employer] of Occupational Health - Occupational Stress Questionnaire    Feeling of Stress : Not at all  Social Connections: Socially Isolated (08/15/2023)   Social Connection and Isolation Panel [NHANES]    Frequency of Communication with Friends and Family: More than three times a week    Frequency of Social Gatherings with Friends and Family: More than three times a week    Attends Religious Services: Never    Database administrator or Organizations: No    Attends Banker Meetings: Never    Marital Status: Widowed  Intimate Partner Violence: Not At Risk (08/15/2023)   Humiliation, Afraid, Rape, and Kick questionnaire    Fear of Current or Ex-Partner: No    Emotionally Abused: No    Physically Abused: No    Sexually Abused: No      PHYSICAL EXAM  Vitals:   09/25/23 1127  BP: (!) 111/57  Pulse: (!) 51  SpO2: 96%  Weight: 177 lb (80.3 kg)  Height: 5\' 2"  (1.575 m)    Body mass index is 32.37 kg/m.  Generalized: Well developed, in no acute distress  Chest: Lungs clear to auscultation bilaterally  Neurological examination  Mentation: Alert oriented to time, place, history taking. Follows all commands speech and language fluent Cranial nerve II-XII:  Extraocular movements were full, visual field were full on confrontational test Head turning and shoulder shrug  were normal and symmetric.  Gait and station: Uses a cane when ambulating   DIAGNOSTIC DATA (LABS, IMAGING, TESTING) - I reviewed patient records, labs, notes, testing and imaging myself where available.  Lab Results  Component Value Date   WBC 7.4 06/11/2023   HGB 14.7 06/11/2023   HCT 45.6 06/11/2023   MCV 99.6 06/11/2023   PLT 202 06/11/2023      Component Value Date/Time   NA 141 09/14/2023 1528   K 4.9 09/14/2023 1528   CL 102 09/14/2023 1528   CO2 26 09/14/2023 1528   GLUCOSE 101 (H) 09/14/2023 1528   GLUCOSE 102 (H) 06/11/2023 1632   BUN 20 09/14/2023 1528   CREATININE 0.77 09/14/2023 1528   CREATININE 0.86 05/15/2013 0941   CALCIUM 10.6 (H) 09/14/2023 1528   PROT 6.9 06/11/2023 1632   PROT 6.1 09/20/2022 1334   ALBUMIN 4.1 06/11/2023 1632   ALBUMIN 4.2 09/20/2022 1334   AST 16 06/11/2023 1632   ALT 27 06/11/2023 1632   ALKPHOS 70 06/11/2023 1632   BILITOT 0.7 06/11/2023 1632   BILITOT 0.5 09/20/2022 1334   GFRNONAA >60 06/11/2023 1632   GFRNONAA 68 05/15/2013 0941   GFRAA 101 06/07/2020 1158   GFRAA 78 05/15/2013 0941   Lab Results  Component Value Date   CHOL 161 03/29/2023   HDL 62 03/29/2023   LDLCALC 73 03/29/2023   TRIG 156 (H) 03/29/2023   CHOLHDL 2.6 03/29/2023   Lab Results  Component Value Date   HGBA1C 6.0 (H) 06/29/2023   Lab Results  Component Value Date   VITAMINB12 677 06/07/2021   Lab Results  Component Value Date   TSH 1.230 09/14/2023      ASSESSMENT AND PLAN 82 y.o. year old female  has a past medical history of Anxiety, Arthritis, CHF (congestive heart failure) (HCC), Chronic pain of multiple joints, Depression, Essential hypertension, GERD (gastroesophageal reflux disease), Hyperlipidemia, Hyperlipidemia associated with type 2 diabetes mellitus (HCC), Hypertension associated with type 2 diabetes mellitus (HCC),  Hypothyroidism, Mild scoliosis (10/19/2021), Osteoporosis, Sleep apnea, 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, left (09/05/2023), Squamous cell carcinoma of skin (09/05/2023), Type 2 diabetes mellitus with hypercholesterolemia (HCC), and Vitamin D deficiency. here with:  OSA on CPAP  - CPAP compliance suboptimal - Good treatment of AHI  - Encourage patient to use CPAP nightly and > 4 hours each night -Uses supplemental oxygen managed by pulmonology - F/U in 1 year or sooner if needed   Butch Penny, MSN, NP-C 09/25/2023, 11:17 AM [facility] [address], [address] [place], [place] [address] [phone]

## 2023-09-26 ENCOUNTER — Telehealth: Payer: Self-pay | Admitting: *Deleted

## 2023-09-26 DIAGNOSIS — M79605 Pain in left leg: Secondary | ICD-10-CM | POA: Diagnosis not present

## 2023-09-26 NOTE — Telephone Encounter (Signed)
Order for oxygen tubing for cpap machine has been sent to [facility].

## 2023-09-26 NOTE — Telephone Encounter (Signed)
[facility] confirmed receipt of order. 

## 2023-09-27 DIAGNOSIS — H905 Unspecified sensorineural hearing loss: Secondary | ICD-10-CM | POA: Diagnosis not present

## 2023-10-01 DIAGNOSIS — M79662 Pain in left lower leg: Secondary | ICD-10-CM | POA: Diagnosis not present

## 2023-10-02 ENCOUNTER — Encounter: Payer: Self-pay | Admitting: Dermatology

## 2023-10-02 ENCOUNTER — Ambulatory Visit: Payer: 59 | Admitting: Dermatology

## 2023-10-02 VITALS — BP 122/60 | HR 50 | Temp 97.3°F

## 2023-10-02 DIAGNOSIS — D0422 Carcinoma in situ of skin of left ear and external auricular canal: Secondary | ICD-10-CM | POA: Diagnosis not present

## 2023-10-02 DIAGNOSIS — L814 Other melanin hyperpigmentation: Secondary | ICD-10-CM | POA: Diagnosis not present

## 2023-10-02 DIAGNOSIS — X32XXXA Exposure to sunlight, initial encounter: Secondary | ICD-10-CM | POA: Diagnosis not present

## 2023-10-02 DIAGNOSIS — D0460 Carcinoma in situ of skin of unspecified upper limb, including shoulder: Secondary | ICD-10-CM

## 2023-10-02 DIAGNOSIS — C4492 Squamous cell carcinoma of skin, unspecified: Secondary | ICD-10-CM

## 2023-10-02 DIAGNOSIS — L569 Acute skin change due to ultraviolet radiation, unspecified: Secondary | ICD-10-CM | POA: Diagnosis not present

## 2023-10-02 NOTE — Progress Notes (Signed)
Follow-Up Visit   Subjective  Danielle Parrish is a 82 y.o. female who presents for the following: Mohs for SCCIS of left earlobe  The following portions of the chart were reviewed this encounter and updated as appropriate: medications, allergies, medical history  Review of Systems:  No other skin or systemic complaints except as noted in HPI or Assessment and Plan.  Objective  Well appearing patient in no apparent distress; mood and affect are within normal limits.  A focused examination was performed of the following areas: Left ear Relevant physical exam findings are noted in the Assessment and Plan.   Left Ear .SCCOBJ              Assessment & Plan   Squamous cell carcinoma of skin Left Ear  Mohs surgery  Consent obtained: written  Anticoagulation: Is the patient taking prescription anticoagulant and/or aspirin prescribed/recommended by a physician? Yes   Was the anticoagulation regimen changed prior to Mohs? 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  Owens Shark, CMA, am acting as scribe for Gwenith Daily, MD.    10/02/2023  HISTORY OF PRESENT ILLNESS  Danielle Parrish is seen in consultation at the request of Dr. Caralyn Guile for biopsy-proven SCCIS of the left earlobe. They note that the area has been present for about 1 year increasing in size.  There is no history of previous treatment.  Reports no other new or  changing lesions and has no other complaints today.  Medications and allergies: see patient chart.  Review of systems: Reviewed 8 systems and notable for the above skin cancer.  All other systems reviewed are unremarkable/negative, unless noted in the HPI. Past medical history, surgical history, family history, social history were also reviewed and are noted in the chart/questionnaire.    PHYSICAL EXAMINATION  General: Well-appearing, in no acute distress, alert and oriented x 4. Vitals reviewed in chart (if available).   Skin: Exam reveals a 1.0 x 0.8 cm erythematous papule and biopsy scar on the left earlobe. There are rhytids, telangiectasias, and lentigines, consistent with photodamage.  Biopsy report(s) reviewed, confirming the diagnosis.   ASSESSMENT  1) Squamous Cell Carcinoma in Situ of the left earlobe  2) photodamage 3) solar lentigines   PLAN   1. Due to location, size, histology, or recurrence and the likelihood of subclinical extension as well as the need to conserve normal surrounding tissue, the patient was deemed acceptable for Mohs micrographic surgery (MMS).  The nature and purpose of the procedure, associated benefits and risks including recurrence and scarring, possible complications such as pain, infection, and bleeding, and alternative methods of treatment if appropriate were discussed with the patient during consent. The lesion location was verified by the patient, by reviewing previous notes, pathology reports, and by photographs as well as angulation measurements if available.  Informed consent was reviewed and signed by the patient, and timeout was performed at 10:45 AM. See op note below.  2. For the photodamage and solar lentigines, sun protection discussed/information given on  OTC sunscreens, and we recommend continued regular follow-up with primary dermatologist every 6 months or sooner for any growing, bleeding, or changing lesions. 3. Prognosis and future  surveillance discussed. 4. Letter with treatment outcome sent to referring provider. 5. Pain- acetaminophen/ibuprofen   MOHS MICROGRAPHIC SURGERY AND RECONSTRUCTION  Initial size:   1.0 x 0.8 cm Surgical defect/wound size: 1.2 x 1.0 cm Anesthesia:    0.33% lidocaine with 1:200,000 epinephrine EBL:    <5 mL Complications:  None Repair type:   NA- Secondary Intention SQ suture:   5-0 Monocryl Cutaneous suture:  6-0 Plain gut   Stages: 1  STAGE I: Anesthesia achieved with 0.5% lidocaine with 1:200,000 epinephrine. ChloraPrep applied. 1 section(s) excised using Mohs technique (this includes total peripheral and deep tissue margin excision and evaluation with frozen sections, excised and interpreted by the same physician). The tumor was first debulked and then excised with an approx. 2mm margin.  Hemostasis was achieved with electrocautery as needed.  The specimen was then oriented, subdivided/relaxed, inked, and processed using Mohs technique.    Frozen section analysis revealed a clear margins.   Patient was notified of results and repair options were discussed, including second intention healing. After reviewing the advantages and disadvantages of each, we agreed on second intention healing as appropriate.   The surgical site was then lightly scrubbed with sterile, saline-soaked gauze.  The area was bandaged using Vaseline ointment, non-adherent gauze, gauze pads, and tape to provide an adequate pressure dressing.   The patient tolerated the procedure well, was given detailed written and verbal wound care instructions, and was discharged in good condition.  The patient will follow-up in 2 weeks on 10/16/23 and as scheduled with primary dermatologist.   Documentation: I have reviewed the above documentation for accuracy and completeness, and I agree with the above.  Gwenith Daily, MD

## 2023-10-03 ENCOUNTER — Encounter: Payer: Self-pay | Admitting: Dermatology

## 2023-10-03 ENCOUNTER — Encounter: Payer: Self-pay | Admitting: Family Medicine

## 2023-10-03 ENCOUNTER — Ambulatory Visit (INDEPENDENT_AMBULATORY_CARE_PROVIDER_SITE_OTHER): Payer: 59 | Admitting: Family Medicine

## 2023-10-03 VITALS — BP 136/64 | HR 62 | Ht 62.0 in | Wt 178.0 lb

## 2023-10-03 DIAGNOSIS — E785 Hyperlipidemia, unspecified: Secondary | ICD-10-CM | POA: Diagnosis not present

## 2023-10-03 DIAGNOSIS — E039 Hypothyroidism, unspecified: Secondary | ICD-10-CM | POA: Diagnosis not present

## 2023-10-03 DIAGNOSIS — E1169 Type 2 diabetes mellitus with other specified complication: Secondary | ICD-10-CM

## 2023-10-03 DIAGNOSIS — I152 Hypertension secondary to endocrine disorders: Secondary | ICD-10-CM

## 2023-10-03 DIAGNOSIS — Z23 Encounter for immunization: Secondary | ICD-10-CM

## 2023-10-03 DIAGNOSIS — E1159 Type 2 diabetes mellitus with other circulatory complications: Secondary | ICD-10-CM | POA: Diagnosis not present

## 2023-10-03 DIAGNOSIS — E7800 Pure hypercholesterolemia, unspecified: Secondary | ICD-10-CM

## 2023-10-03 DIAGNOSIS — I480 Paroxysmal atrial fibrillation: Secondary | ICD-10-CM | POA: Diagnosis not present

## 2023-10-03 DIAGNOSIS — F339 Major depressive disorder, recurrent, unspecified: Secondary | ICD-10-CM

## 2023-10-03 LAB — BAYER DCA HB A1C WAIVED: HB A1C (BAYER DCA - WAIVED): 5.4 % (ref 4.8–5.6)

## 2023-10-03 MED ORDER — OLMESARTAN MEDOXOMIL 20 MG PO TABS
20.0000 mg | ORAL_TABLET | Freq: Every day | ORAL | 3 refills | Status: DC
Start: 2023-10-03 — End: 2024-08-04

## 2023-10-03 MED ORDER — METOPROLOL SUCCINATE ER 25 MG PO TB24
25.0000 mg | ORAL_TABLET | Freq: Every day | ORAL | 3 refills | Status: DC
Start: 2023-10-03 — End: 2024-08-07

## 2023-10-03 MED ORDER — AMLODIPINE BESYLATE 10 MG PO TABS
10.0000 mg | ORAL_TABLET | Freq: Every day | ORAL | 3 refills | Status: DC
Start: 2023-10-03 — End: 2024-08-04

## 2023-10-03 MED ORDER — LOVASTATIN 40 MG PO TABS: 40.0000 mg | ORAL_TABLET | Freq: Every day | ORAL | 3 refills | Status: DC

## 2023-10-03 MED ORDER — GABAPENTIN 300 MG PO CAPS: 300.0000 mg | ORAL_CAPSULE | Freq: Three times a day (TID) | ORAL | 3 refills | Status: DC

## 2023-10-03 MED ORDER — SERTRALINE HCL 100 MG PO TABS
200.0000 mg | ORAL_TABLET | Freq: Every day | ORAL | 3 refills | Status: DC
Start: 2023-10-03 — End: 2024-02-28

## 2023-10-03 MED ORDER — APIXABAN 5 MG PO TABS: 5.0000 mg | ORAL_TABLET | Freq: Two times a day (BID) | ORAL | 3 refills | Status: DC

## 2023-10-03 NOTE — Telephone Encounter (Signed)
Pt called stating that her cpap supplies order was sent to the wrong place. Pt states that it needs to be sent to the [facility]. Please advise.

## 2023-10-03 NOTE — Progress Notes (Signed)
BP 136/64   Pulse 62   Ht 5\' 2"  (1.575 m)   Wt 178 lb (80.7 kg)   SpO2 93%   BMI 32.56 kg/m    Subjective:   Patient ID: Danielle Parrish, female    DOB: 02/05/1941, 82 y.o.   MRN: [id number]  HPI: Danielle Parrish is a 82 y.o. female presenting on 10/03/2023 for Medical Management of Chronic Issues, Diabetes, Hypertension, Hyperlipidemia, and Hypothyroidism   HPI Type 2 diabetes mellitus Patient comes in today for recheck of his diabetes. Patient has been currently taking no medicine currently, diet control. Patient is currently on an ACE inhibitor/ARB. Patient has not seen an ophthalmologist this year. Patient denies any new issues with their feet. The symptom started onset as an adult hypertension and hyperlipidemia and CHF and A-fib ARE RELATED TO DM   Hypertension and CHF and A-fib Patient is currently on furosemide and amlodipine and metoprolol and olmesartan, and their blood pressure today is 136/64. Patient denies any lightheadedness or dizziness. Patient denies headaches, blurred vision, chest pains, shortness of breath, or weakness. Denies any side effects from medication and is content with current medication.   Hyperlipidemia Patient is coming in for recheck of his hyperlipidemia. The patient is currently taking lovastatin. They deny any issues with myalgias or history of liver damage from it. They deny any focal numbness or weakness or chest pain.   Hypothyroidism recheck Patient is coming in for thyroid recheck today as well. They deny any issues with hair changes or heat or cold problems or diarrhea or constipation. They deny any chest pain or palpitations. They are currently on levothyroxine 100 micrograms   Patient has chronic respiratory failure on 4 L nasal cannula oxygen  Paroxysmal A-fib Patient has A-fib and currently takes Eliquis and metoprolol for this.  She sees cardio for this and seems to be doing well.  Depression recheck Patient is coming in for  depression recheck.  She currently takes sertraline.  She says she feels like the medicine is doing very well for her and denies any major issues and she is very happy with that and she does notice when she misses a day and it can make a difference but she feels like she is doing really well.    08/15/2023    8:07 AM 08/06/2023    8:09 AM 07/16/2023    2:25 PM 06/29/2023   11:07 AM 03/29/2023   11:35 AM  Depression screen PHQ 2/9  Decreased Interest 0 3 0 0 1  Down, Depressed, Hopeless 0 3 0 0 1  PHQ - 2 Score 0 6 0 0 2  Altered sleeping 0 3  1 1   Tired, decreased energy 0 3 1 1 2   Change in appetite 0 2 1 1 1   Feeling bad or failure about yourself  0 3 1 1 1   Trouble concentrating 0 3 1 1 1   Moving slowly or fidgety/restless 0 1 0 0 0  Suicidal thoughts 0 0 0 0 1  PHQ-9 Score 0 21  5 9   Difficult doing work/chores Not difficult at all Not difficult at all Not difficult at all Somewhat difficult Somewhat difficult     Relevant past medical, surgical, family and social history reviewed and updated as indicated. Interim medical history since our last visit reviewed. Allergies and medications reviewed and updated.  Review of Systems  Constitutional:  Negative for chills and fever.  HENT:  Negative for congestion, ear discharge and ear pain.  Eyes:  Negative for redness and visual disturbance.  Respiratory:  Negative for cough, chest tightness, shortness of breath and wheezing.   Cardiovascular:  Negative for chest pain and leg swelling.  Genitourinary:  Negative for difficulty urinating and dysuria.  Musculoskeletal:  Negative for back pain and gait problem.  Skin:  Negative for rash.  Neurological:  Negative for dizziness, light-headedness and headaches.  Psychiatric/Behavioral:  Negative for agitation and behavioral problems.   All other systems reviewed and are negative.   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    Penicillins Hives, Other (See Comments)   Has patient had a PCN reaction causing immediate rash, facial/tongue/throat swelling, SOB or lightheadedness with hypotension: Yes Has patient had a PCN reaction causing severe rash involving mucus membranes or skin necrosis: unknown Has patient had a PCN reaction that required hospitalization No Has patient had a PCN reaction occurring within the last 10 years: No If all of the above answers are "NO", then may proceed with Cephalosporin use.   Rosuvastatin Other (See Comments)   Body aches.   Statins Other (See Comments)   Vytorin [ezetimibe-simvastatin]    Body aches.    Aspirin Nausea Only   Tolmetin Nausea Only   Burns stomach.         Medication List        Accurate as of October 03, 2023 11:24 AM. If you have any questions, ask your nurse or doctor.          acetaminophen 500 MG tablet Commonly known as: TYLENOL Take 1 tablet (500 mg total) by mouth every 6 (six) hours as needed.   albuterol 108 (90 Base) MCG/ACT inhaler Commonly known as: VENTOLIN HFA Take 2 puffs eveery 6 hours as needed for wheezing or shortness of breath   amLODipine 10 MG tablet Commonly known as: NORVASC Take 1 tablet (10 mg total) by mouth daily.   apixaban 5 MG Tabs tablet Commonly known as: Eliquis Take 1 tablet (5 mg total) by mouth 2 (two) times daily.   azelastine 0.1 % nasal spray Commonly known as: ASTELIN Place 1 spray into both nostrils 2 (two) times daily. Use in each nostril as directed   calcium-vitamin D 500-200 MG-UNIT Tabs tablet Commonly known as: OSCAL WITH D TAKE 1 TABLET BY MOUTH EVERY DAY WITH BREAKFAST   cetirizine 10 MG tablet Commonly known as: ZYRTEC Take 1 tablet (10 mg total) by mouth daily.   cyclobenzaprine 5 MG tablet Commonly known as: FLEXERIL Take 1 tablet (5 mg total) by mouth 3 (three) times daily as  needed for muscle spasms.   diclofenac Sodium 1 % Gel Commonly known as: VOLTAREN APPLY 4 GRAMS TOPICALLY 4 (FOUR) TIMES DAILY.   diphenhydrAMINE 25 MG tablet Commonly known as: BENADRYL Take 50 mg by mouth at bedtime as needed for allergies.   furosemide 40 MG tablet Commonly known as: Lasix Take 1 tablet (40 mg total) by mouth daily.   gabapentin 300 MG capsule Commonly known as: NEURONTIN Take 1 capsule (300 mg total) by mouth 3 (three) times daily. What changed: See the new instructions. Changed by: Elige Radon Aquinnah Devin   Iron 325 (65 Fe) MG Tabs Take 1 tablet by mouth 3 (three) times daily.   ketoconazole 2 % cream Commonly known as: NIZORAL APPLY  TO AFFECTED AREA EVERYDAY   levothyroxine 100 MCG tablet Commonly known as: SYNTHROID TAKE 1 TABLET BY MOUTH EVERY DAY BEFORE BREAKFAST   lidocaine 5 % Commonly known as: LIDODERM PLACE 1 PATCH ONTO THE SKIN DAILY. REMOVE & DISCARD PATCH WITHIN 12 HOURS OR AS DIRECTED BY MD   linaclotide 145 MCG Caps capsule Commonly known as: LINZESS Take 145 mcg by mouth as needed.   lovastatin 40 MG tablet Commonly known as: MEVACOR Take 1 tablet (40 mg total) by mouth at bedtime. What changed: See the new instructions. Changed by: Elige Radon Sherrilyn Nairn   metoprolol succinate 25 MG 24 hr tablet Commonly known as: TOPROL-XL Take 1 tablet (25 mg total) by mouth daily.   mirabegron ER 25 MG Tb24 tablet Commonly known as: Myrbetriq Take 1 tablet (25 mg total) by mouth daily.   nystatin powder Commonly known as: MYCOSTATIN/NYSTOP APPLY TO AFFECTED AREA 4 TIMES A DAY   olmesartan 20 MG tablet Commonly known as: BENICAR Take 1 tablet (20 mg total) by mouth daily.   omeprazole 20 MG capsule Commonly known as: PRILOSEC Take 1 capsule (20 mg total) by mouth 2 (two) times daily before a meal. What changed: when to take this   PRESERVISION AREDS 2 PO Take by mouth.   pyridOXINE 100 MG tablet Commonly known as: VITAMIN B6 Take  100 mg by mouth daily.   sertraline 100 MG tablet Commonly known as: ZOLOFT Take 2 tablets (200 mg total) by mouth daily.   sodium chloride 0.65 % Soln nasal spray Commonly known as: OCEAN Place 1 spray into both nostrils as needed for congestion.   VITAMIN D-3 PO Take 1 capsule by mouth daily.         Objective:   BP 136/64   Pulse 62   Ht 5\' 2"  (1.575 m)   Wt 178 lb (80.7 kg)   SpO2 93%   BMI 32.56 kg/m   Wt Readings from Last 3 Encounters:  10/03/23 178 lb (80.7 kg)  09/25/23 177 lb (80.3 kg)  09/07/23 175 lb 12.8 oz (79.7 kg)    Physical Exam Vitals and nursing note reviewed.  Constitutional:      General: She is not in acute distress.    Appearance: She is well-developed. She is not diaphoretic.     Comments: On 4 L nasal cannula oxygen  Eyes:     Conjunctiva/sclera: Conjunctivae normal.  Cardiovascular:     Rate and Rhythm: Normal rate and regular rhythm.     Heart sounds: Normal heart sounds. No murmur heard. Pulmonary:     Effort: Pulmonary effort is normal. No respiratory distress.     Breath sounds: Normal breath sounds. No wheezing.  Musculoskeletal:        General: No swelling or tenderness. Normal range of motion.  Skin:    General: Skin is warm and dry.     Findings: No rash.  Neurological:     Mental Status: She is alert and oriented to person, place, and time.     Coordination: Coordination normal.  Psychiatric:        Behavior: Behavior normal.       Assessment & Plan:   Problem List Items Addressed This Visit       Cardiovascular and Mediastinum   Hypertension associated with type 2 diabetes mellitus (HCC) (Chronic)   Relevant Medications   apixaban (ELIQUIS) 5 MG TABS tablet   amLODipine (NORVASC) 10 MG tablet   lovastatin (MEVACOR) 40 MG tablet  metoprolol succinate (TOPROL-XL) 25 MG 24 hr tablet   olmesartan (BENICAR) 20 MG tablet   Other Relevant Orders   CBC with Differential/Platelet   CMP14+EGFR   Lipid panel    Bayer DCA Hb A1c Waived   Paroxysmal atrial fibrillation (HCC) (Chronic)   Relevant Medications   apixaban (ELIQUIS) 5 MG TABS tablet   amLODipine (NORVASC) 10 MG tablet   lovastatin (MEVACOR) 40 MG tablet   metoprolol succinate (TOPROL-XL) 25 MG 24 hr tablet   olmesartan (BENICAR) 20 MG tablet     Endocrine   Hyperlipidemia associated with type 2 diabetes mellitus (HCC) (Chronic)   Relevant Medications   apixaban (ELIQUIS) 5 MG TABS tablet   amLODipine (NORVASC) 10 MG tablet   lovastatin (MEVACOR) 40 MG tablet   metoprolol succinate (TOPROL-XL) 25 MG 24 hr tablet   olmesartan (BENICAR) 20 MG tablet   Other Relevant Orders   CBC with Differential/Platelet   CMP14+EGFR   Lipid panel   Bayer DCA Hb A1c Waived   Type 2 diabetes mellitus with hypercholesterolemia (HCC) - Primary   Relevant Medications   apixaban (ELIQUIS) 5 MG TABS tablet   amLODipine (NORVASC) 10 MG tablet   lovastatin (MEVACOR) 40 MG tablet   metoprolol succinate (TOPROL-XL) 25 MG 24 hr tablet   olmesartan (BENICAR) 20 MG tablet   Other Relevant Orders   CBC with Differential/Platelet   CMP14+EGFR   Lipid panel   Bayer DCA Hb A1c Waived   Hypothyroidism   Relevant Medications   metoprolol succinate (TOPROL-XL) 25 MG 24 hr tablet     Other   Depression, recurrent (HCC)   Relevant Medications   sertraline (ZOLOFT) 100 MG tablet    Seems to be doing well, continue current medicine, A1c is 5.4 and blood pressure looks good.  Continue with oxygen.  Continue with cardiology and other specialists. Follow up plan: Return in about 3 months (around 01/03/2024), or if symptoms worsen or fail to improve, for Diabetes hypertension and hypothyroidism.  Counseling provided for all of the vaccine components Orders Placed This Encounter  Procedures   CBC with Differential/Platelet   CMP14+EGFR   Lipid panel   Bayer DCA Hb A1c Waived    Arville Care, MD [facility] 10/03/2023, 11:24  AM

## 2023-10-03 NOTE — Telephone Encounter (Signed)
Pt aware faxing orders to [place] this afternoon

## 2023-10-04 LAB — CMP14+EGFR
ALT: 20 [IU]/L (ref 0–32)
AST: 18 [IU]/L (ref 0–40)
Albumin: 4.5 g/dL (ref 3.7–4.7)
Alkaline Phosphatase: 67 [IU]/L (ref 44–121)
BUN/Creatinine Ratio: 28 (ref 12–28)
BUN: 19 mg/dL (ref 8–27)
Bilirubin Total: 0.3 mg/dL (ref 0.0–1.2)
CO2: 29 mmol/L (ref 20–29)
Calcium: 10.8 mg/dL — ABNORMAL HIGH (ref 8.7–10.3)
Chloride: 100 mmol/L (ref 96–106)
Creatinine, Ser: 0.68 mg/dL (ref 0.57–1.00)
Globulin, Total: 2.2 g/dL (ref 1.5–4.5)
Glucose: 140 mg/dL — ABNORMAL HIGH (ref 70–99)
Potassium: 4.6 mmol/L (ref 3.5–5.2)
Sodium: 142 mmol/L (ref 134–144)
Total Protein: 6.7 g/dL (ref 6.0–8.5)
eGFR: 87 mL/min/{1.73_m2} (ref >59)

## 2023-10-04 LAB — LIPID PANEL
Chol/HDL Ratio: 3.3 {ratio} (ref 0.0–4.4)
Cholesterol, Total: 206 mg/dL — ABNORMAL HIGH (ref 100–199)
HDL: 63 mg/dL (ref >39)
LDL Chol Calc (NIH): 114 mg/dL — ABNORMAL HIGH (ref 0–99)
Triglycerides: 169 mg/dL — ABNORMAL HIGH (ref 0–149)
VLDL Cholesterol Cal: 29 mg/dL (ref 5–40)

## 2023-10-04 LAB — CBC WITH DIFFERENTIAL/PLATELET
Basophils Absolute: 0 10*3/uL (ref 0.0–0.2)
Basos: 1 %
EOS (ABSOLUTE): 0.1 10*3/uL (ref 0.0–0.4)
Eos: 1 %
Hematocrit: 42.8 % (ref 34.0–46.6)
Hemoglobin: 13.6 g/dL (ref 11.1–15.9)
Immature Grans (Abs): 0 10*3/uL (ref 0.0–0.1)
Immature Granulocytes: 0 %
Lymphocytes Absolute: 2 10*3/uL (ref 0.7–3.1)
Lymphs: 48 %
MCH: 32.1 pg (ref 26.6–33.0)
MCHC: 31.8 g/dL (ref 31.5–35.7)
MCV: 101 fL — ABNORMAL HIGH (ref 79–97)
Monocytes Absolute: 0.3 10*3/uL (ref 0.1–0.9)
Monocytes: 8 %
Neutrophils Absolute: 1.7 10*3/uL (ref 1.4–7.0)
Neutrophils: 42 %
Platelets: 160 10*3/uL (ref 150–450)
RBC: 4.24 x10E6/uL (ref 3.77–5.28)
RDW: 11.4 % — ABNORMAL LOW (ref 11.7–15.4)
WBC: 4.2 10*3/uL (ref 3.4–10.8)

## 2023-10-11 DIAGNOSIS — J9611 Chronic respiratory failure with hypoxia: Secondary | ICD-10-CM | POA: Diagnosis not present

## 2023-10-11 DIAGNOSIS — R0609 Other forms of dyspnea: Secondary | ICD-10-CM | POA: Diagnosis not present

## 2023-10-16 ENCOUNTER — Ambulatory Visit: Payer: 59 | Admitting: Dermatology

## 2023-10-16 ENCOUNTER — Encounter: Payer: Self-pay | Admitting: Dermatology

## 2023-10-16 VITALS — BP 120/80

## 2023-10-16 DIAGNOSIS — D0462 Carcinoma in situ of skin of left upper limb, including shoulder: Secondary | ICD-10-CM

## 2023-10-16 DIAGNOSIS — C4492 Squamous cell carcinoma of skin, unspecified: Secondary | ICD-10-CM

## 2023-10-16 DIAGNOSIS — L988 Other specified disorders of the skin and subcutaneous tissue: Secondary | ICD-10-CM | POA: Diagnosis not present

## 2023-10-16 NOTE — Progress Notes (Signed)
   Follow-Up Visit   Subjective  Danielle Parrish is a 82 y.o. female who presents for the following: Excision for SCCIS of left wrist  She is also s/p Mohs for SCCIS on the left earlobe  The following portions of the chart were reviewed this encounter and updated as appropriate: medications, allergies, medical history  Review of Systems:  No other skin or systemic complaints except as noted in HPI or Assessment and Plan.  Objective  Well appearing patient in no apparent distress; mood and affect are within normal limits.  A focused examination was performed of the following areas: Left wrist Relevant physical exam findings are noted in the Assessment and Plan.    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  Skin excision  Lesion length (cm):  1 Lesion width (cm):  1.2 Margin per side (cm):  0.4 Total excision diameter (cm):  2 Informed consent: discussed and consent obtained   Timeout: patient name, date of birth, surgical site, and procedure verified   Procedure prep:  Patient was prepped and draped in usual sterile fashion Prep type:  Chlorhexidine Anesthesia: the lesion was anesthetized in a standard fashion   Anesthetic:  1% lidocaine w/ epinephrine 1-100,000 local infiltration Instrument used: #15 blade   Hemostasis achieved with: suture   Hemostasis achieved with comment:  3.0 PDS, 5.0 fast absorbing Outcome: patient tolerated procedure well with no complications   Post-procedure details: sterile dressing applied and wound care instructions given   Dressing type: bandage and pressure dressing    Skin repair Complexity:  Intermediate Final length (cm):  5.8 Informed consent: discussed and consent obtained   Timeout: patient name, date of birth, surgical site, and procedure verified   Procedure prep:  Patient was prepped and draped in usual sterile fashion Prep type:  Chlorhexidine Anesthesia: the lesion was anesthetized in a  standard fashion   Reason for type of repair: reduce tension to allow closure, reduce the risk of dehiscence, infection, and necrosis, preserve normal anatomy and avoid adjacent structures   Subcutaneous layers (deep stitches):  Suture size:  4-0 Suture type: PDS (polydioxanone)   Stitches:  Buried vertical mattress Fine/surface layer approximation (top stitches):  Suture size:  5-0 Suture type: fast-absorbing plain gut   Stitches: simple running   Hemostasis achieved with: suture and electrodesiccation Outcome: patient tolerated procedure well with no complications   Post-procedure details: sterile dressing applied and wound care instructions given   Dressing type: pressure dressing and bacitracin    Specimen 1 - Surgical pathology Differential Diagnosis: SCCIS [id number]  Check Margins: No   Return in about 4 weeks (around 11/13/2023).  I, Tillie Fantasia, CMA, am acting as scribe for Gwenith Daily, MD.   Documentation: I have reviewed the above documentation for accuracy and completeness, and I agree with the above.  Gwenith Daily, MD

## 2023-10-16 NOTE — Patient Instructions (Signed)

## 2023-10-17 LAB — SURGICAL PATHOLOGY

## 2023-10-18 ENCOUNTER — Other Ambulatory Visit: Payer: Self-pay | Admitting: Family Medicine

## 2023-10-18 ENCOUNTER — Telehealth: Payer: Self-pay

## 2023-10-18 DIAGNOSIS — M545 Low back pain, unspecified: Secondary | ICD-10-CM

## 2023-10-18 DIAGNOSIS — B372 Candidiasis of skin and nail: Secondary | ICD-10-CM

## 2023-10-18 NOTE — Telephone Encounter (Signed)
-----   Message from [facility] sent at 10/18/2023 10:55 AM EDT ----- Please communicate clear margins for excision of SCCIS on left wrist. Reassured. Please recommend 6-12 month skin check

## 2023-10-18 NOTE — Telephone Encounter (Signed)
Spoke with pt and gave her results.

## 2023-10-25 DIAGNOSIS — Z961 Presence of intraocular lens: Secondary | ICD-10-CM | POA: Diagnosis not present

## 2023-10-25 DIAGNOSIS — H04123 Dry eye syndrome of bilateral lacrimal glands: Secondary | ICD-10-CM | POA: Diagnosis not present

## 2023-11-01 ENCOUNTER — Encounter: Payer: Self-pay | Admitting: Family Medicine

## 2023-11-01 ENCOUNTER — Ambulatory Visit (INDEPENDENT_AMBULATORY_CARE_PROVIDER_SITE_OTHER): Payer: 59 | Admitting: Family Medicine

## 2023-11-01 VITALS — BP 129/69 | HR 64 | Temp 97.8°F | Ht 62.0 in | Wt 183.0 lb

## 2023-11-01 DIAGNOSIS — H6123 Impacted cerumen, bilateral: Secondary | ICD-10-CM

## 2023-11-01 DIAGNOSIS — T3695XA Adverse effect of unspecified systemic antibiotic, initial encounter: Secondary | ICD-10-CM | POA: Diagnosis not present

## 2023-11-01 DIAGNOSIS — B379 Candidiasis, unspecified: Secondary | ICD-10-CM | POA: Diagnosis not present

## 2023-11-01 MED ORDER — FLUCONAZOLE 150 MG PO TABS
ORAL_TABLET | ORAL | 0 refills | Status: DC
Start: 2023-11-01 — End: 2024-04-24

## 2023-11-01 NOTE — Progress Notes (Signed)
   Acute Office Visit  Subjective:     Patient ID: Danielle Parrish, female    DOB: 1941-06-17, 82 y.o.   MRN: [id number]  Chief Complaint  Patient presents with   Cerumen Impaction    HPI Patient is in today for ear wax removal. She got new hearing aids and audiology recommend have ear wax removed so that the hearing aids would fit better. She denies pain, discharge, or fever.   She also reports vaginal itching and discomfort since completing abx a few weeks ago. Hx of abx induced yeast infections. Denies fever, abdominal pain, vaginal discharge.   ROS As per HPI.      Objective:    BP 129/69   Pulse 64   Temp 97.8 F (36.6 C) (Temporal)   Ht 5\' 2"  (1.575 m)   Wt 183 lb (83 kg)   SpO2 94% Comment: on 2 L via [place]  BMI 33.47 kg/m    Physical Exam Vitals and nursing note reviewed.  Constitutional:      General: She is not in acute distress.    Appearance: She is not ill-appearing, toxic-appearing or diaphoretic.  HENT:     Right Ear: External ear normal. There is impacted cerumen.     Left Ear: External ear normal. There is impacted cerumen.  Pulmonary:     Effort: Pulmonary effort is normal. No respiratory distress.  Musculoskeletal:     Cervical back: Neck supple. No rigidity.     Right lower leg: No edema.     Left lower leg: No edema.  Skin:    General: Skin is warm and dry.  Neurological:     Mental Status: She is alert and oriented to person, place, and time. Mental status is at baseline.  Psychiatric:        Mood and Affect: Mood normal.        Behavior: Behavior normal.    Ear Cerumen Removal  Date/Time: 11/01/2023 9:34 AM  Performed by: Gabriel Earing, FNP Authorized by: Gabriel Earing, FNP  Location details: right ear and left ear Patient tolerance: patient tolerated the procedure well with no immediate complications Comments: Normal TMs visualized after irrigation. Procedure type: irrigation  Sedation: Patient sedated: no      No  results found for any visits on 11/01/23.      Assessment & Plan:   [place] was seen today for cerumen impaction.  Diagnoses and all orders for this visit:  Bilateral impacted cerumen Irrigation today. Tolerated well. Dicussed debrox OTC.  -     Ear Cerumen Removal  Antibiotic-induced yeast infection Diflucan as below. Return to office for new or worsening symptoms, or if symptoms persist.  -     fluconazole (DIFLUCAN) 150 MG tablet; Take one tablet by mouth now. Repeat in 3 days if symptoms persist.  The patient indicates understanding of these issues and agrees with the plan.  Gabriel Earing, FNP

## 2023-11-11 DIAGNOSIS — R0609 Other forms of dyspnea: Secondary | ICD-10-CM | POA: Diagnosis not present

## 2023-11-11 DIAGNOSIS — J9611 Chronic respiratory failure with hypoxia: Secondary | ICD-10-CM | POA: Diagnosis not present

## 2023-11-20 ENCOUNTER — Encounter: Payer: Self-pay | Admitting: Dermatology

## 2023-11-20 ENCOUNTER — Ambulatory Visit: Payer: 59 | Admitting: Dermatology

## 2023-11-20 DIAGNOSIS — L578 Other skin changes due to chronic exposure to nonionizing radiation: Secondary | ICD-10-CM

## 2023-11-20 DIAGNOSIS — L570 Actinic keratosis: Secondary | ICD-10-CM

## 2023-11-20 DIAGNOSIS — Z86007 Personal history of in-situ neoplasm of skin: Secondary | ICD-10-CM | POA: Diagnosis not present

## 2023-11-20 DIAGNOSIS — W908XXA Exposure to other nonionizing radiation, initial encounter: Secondary | ICD-10-CM | POA: Diagnosis not present

## 2023-11-20 DIAGNOSIS — L905 Scar conditions and fibrosis of skin: Secondary | ICD-10-CM | POA: Diagnosis not present

## 2023-11-20 DIAGNOSIS — Z5111 Encounter for antineoplastic chemotherapy: Secondary | ICD-10-CM | POA: Diagnosis not present

## 2023-11-20 MED ORDER — FLUOROURACIL 5 % EX CREA
TOPICAL_CREAM | Freq: Two times a day (BID) | CUTANEOUS | 0 refills | Status: DC
Start: 2023-11-20 — End: 2024-06-14

## 2023-11-20 NOTE — Patient Instructions (Addendum)
Efudex Instructions 1. Apply a thin layer of Efudex cream to the specified sun  damaged area twice a day. The medication is to be applied to the entire  area, not just the keratoses (rough spots). Wash hands thoroughly after  application! 2. Use Efudex twice daily for 2 weeks to 4 weeks (depending on the  physician directions) or once a day for 4 weeks (depending on the  physician directions).  3. The skin will become red and scaly. This is the appropriate response.  4. Once redness starts, you may begin using Hydrocortisone 1% Cream (or  other topical steroid provided by the physician) twice a day. This helps  relieve pain and will speed the healing process. It may be applied as soon  as 30 minutes after Efudex application. 5. For excessive reactions or if suspicious for an infection or allergic  reaction, please call to make an appointment at my office so that I may  evaluate your response to treatment. 6. The area may be washed as you normally would with soap and water. Do  not wash the face for at least three hours after Efudex  application. 7. Ladies: Chrystie Nose is permitted throughout the entire treatment period. Wait  30 minutes after Efudex use before applying makeup directly on top of the  medication. 8. Please read the instructional pamphlet provided by the [employer] so that you may familiarize yourself with the kinds of reactions  you may experience. 9. If you have had a biopsy on any area that you will use the medication on,  DO NOT start the Efudex until your results are given to you and  the medical assistant lets you know it is okay to start the treatment  Important Information  Due to recent changes in healthcare laws, you may see results of your pathology and/or laboratory studies on MyChart before the doctors have had a chance to review them. We understand that in some cases there may be results that are confusing or concerning to you. Please understand that not  all results are received at the same time and often the doctors may need to interpret multiple results in order to provide you with the best plan of care or course of treatment. Therefore, we ask that you please give [place] 2 business days to thoroughly review all your results before contacting the office for clarification. Should we see a critical lab result, you will be contacted sooner.   If You Need Anything After Your Visit  If you have any questions or concerns for your doctor, please call our main line at [phone] If no one answers, please leave a voicemail as directed and we will return your call as soon as possible. Messages left after 4 pm will be answered the following business day.   You may also send [place] a message via MyChart. We typically respond to MyChart messages within 1-2 business days.  For prescription refills, please ask your pharmacy to contact our office. Our fax number is [phone].  If you have an urgent issue when the clinic is closed that cannot wait until the next business day, you can page your doctor at the number below.    Please note that while we do our best to be available for urgent issues outside of office hours, we are not available 24/7.   If you have an urgent issue and are unable to reach [place], you may choose to seek medical care at your doctor's office, retail clinic, urgent  care center, or emergency room.  If you have a medical emergency, please immediately call 911 or go to the emergency department. In the event of inclement weather, please call our main line at [phone] for an update on the status of any delays or closures.  Dermatology Medication Tips: Please keep the boxes that topical medications come in in order to help keep track of the instructions about where and how to use these. Pharmacies typically print the medication instructions only on the boxes and not directly on the medication tubes.   If your medication is too expensive, please  contact our office at [phone] or send [place] a message through MyChart.   We are unable to tell what your co-pay for medications will be in advance as this is different depending on your insurance coverage. However, we may be able to find a substitute medication at lower cost or fill out paperwork to get insurance to cover a needed medication.   If a prior authorization is required to get your medication covered by your insurance company, please allow [place] 1-2 business days to complete this process.  Drug prices often vary depending on where the prescription is filled and some pharmacies may offer cheaper prices.  The website www.goodrx.com contains coupons for medications through different pharmacies. The prices here do not account for what the cost may be with help from insurance (it may be cheaper with your insurance), but the website can give you the price if you did not use any insurance.  - You can print the associated coupon and take it with your prescription to the pharmacy.  - You may also stop by our office during regular business hours and pick up a GoodRx coupon card.  - If you need your prescription sent electronically to a different pharmacy, notify our office through [facility] or by phone at [phone]    Skin Education :   I counseled the patient regarding the following: Sun screen (SPF 30 or greater) should be applied during peak UV exposure (between 10am and 2pm) and reapplied after exercise or swimming.  The ABCDEs of melanoma were reviewed with the patient, and the importance of monthly self-examination of moles was emphasized. Should any moles change in shape or color, or itch, bleed or burn, pt will contact our office for evaluation sooner then their interval appointment.  Plan: Sunscreen Recommendations I recommended a broad spectrum sunscreen with a SPF of 30 or higher. I explained that SPF 30 sunscreens block approximately 97 percent of the sun's harmful  rays. Sunscreens should be applied at least 15 minutes prior to expected sun exposure and then every 2 hours after that as long as sun exposure continues. If swimming or exercising sunscreen should be reapplied every 45 minutes to an hour after getting wet or sweating. One ounce, or the equivalent of a shot glass full of sunscreen, is adequate to protect the skin not covered by a bathing suit. I also recommended a lip balm with a sunscreen as well. Sun protective clothing can be used in lieu of sunscreen but must be worn the entire time you are exposed to the sun's rays.

## 2023-11-20 NOTE — Progress Notes (Signed)
Follow-Up Visit   Subjective  Danielle Parrish is a 82 y.o. 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  The following portions of the chart were reviewed this encounter and updated as appropriate: medications, allergies, medical history  Review of Systems:  No other skin or systemic complaints except as noted in HPI or Assessment and Plan.  Objective  Well appearing patient in no apparent distress; mood and affect are within normal limits.  A focused examination was performed of the following areas:  Right wrist and left earlobe.  Relevant exam findings are noted in the Assessment and Plan.      Assessment & Plan   Scar s/p Mohs for SCCIS, treated on 10/02/2023, healed by secondary Intention - Reassured that wound has healed well - Discussed that scars take up to 12 months to mature from the date of surgery - Recommend SPF 30+ to scar daily to prevent purple color - OK to start scar massage at 4-6 weeks post-op - Can consider silicone based products for scar healing  Scar s/p WLE for SCCIS, treated on 10/22/204, repaired with linear - Reassured that wound has healed well - Discussed that scars take up to 12 months to mature from the date of surgery - Recommend SPF 30+ to scar daily to prevent purple color - OK to start scar massage at 4-6 weeks post-op - Can consider silicone based products for scar healing  We discussed wound healing and goals.  We talked about scar massage and helping the skin to relax back down.  We talked about AK or precancer and using efudex to help clear her skin.  ACTINIC DAMAGE WITH PRECANCEROUS ACTINIC KERATOSES- bilateral lower cheeks Counseling for Topical Chemotherapy Management: Patient exhibits: - Severe, confluent actinic changes with pre-cancerous actinic keratoses that is secondary to cumulative UV radiation exposure over time - Condition that is  severe; chronic, not at goal. - diffuse scaly erythematous macules and papules with underlying dyspigmentation - Discussed Prescription "Field Treatment" topical Chemotherapy for Severe, Chronic Confluent Actinic Changes with Pre-Cancerous Actinic Keratoses Field treatment involves treatment of an entire area of skin that has confluent Actinic Changes (Sun/ Ultraviolet light damage) and PreCancerous Actinic Keratoses by method of PhotoDynamic Therapy (PDT) and/or prescription Topical Chemotherapy agents such as 5-fluorouracil, 5-fluorouracil/calcipotriene, and/or imiquimod.  The purpose is to decrease the number of clinically evident and subclinical PreCancerous lesions to prevent progression to development of skin cancer by chemically destroying early precancer changes that may or may not be visible.  It has been shown to reduce the risk of developing skin cancer in the treated area. As a result of treatment, redness, scaling, crusting, and open sores may occur during treatment course. One or more than one of these methods may be used and may have to be used several times to control, suppress and eliminate the PreCancerous changes. Discussed treatment course, expected reaction, and possible side effects. - Recommend daily broad spectrum sunscreen SPF 30+ to sun-exposed areas, reapply every 2 hours as needed.  - Staying in the shade or wearing long sleeves, sun glasses (UVA+UVB protection) and wide brim hats (4-inch brim around the entire circumference of the hat) are also recommended. - Call for new or changing lesions.  AK (actinic keratosis) Head - Anterior (Face)  fluorouracil (EFUDEX) 5 % cream - Head - Anterior (Face) Apply topically 2 (two) times daily for 14 days. 2x daily to cheeks  and jaw line for 2 wks.   Return in about 3 months (around 02/20/2024) for AK f/u.  I, Tillie Fantasia, CMA, am acting as scribe for Gwenith Daily, MD.   Documentation: I have reviewed the above documentation for  accuracy and completeness, and I agree with the above.  Gwenith Daily, MD

## 2023-11-30 ENCOUNTER — Ambulatory Visit: Payer: 59 | Admitting: Internal Medicine

## 2023-12-11 DIAGNOSIS — R0609 Other forms of dyspnea: Secondary | ICD-10-CM | POA: Diagnosis not present

## 2023-12-11 DIAGNOSIS — J9611 Chronic respiratory failure with hypoxia: Secondary | ICD-10-CM | POA: Diagnosis not present

## 2023-12-14 ENCOUNTER — Other Ambulatory Visit: Payer: Self-pay | Admitting: Dermatology

## 2023-12-14 ENCOUNTER — Other Ambulatory Visit: Payer: Self-pay | Admitting: Family Medicine

## 2023-12-14 DIAGNOSIS — L570 Actinic keratosis: Secondary | ICD-10-CM

## 2023-12-14 DIAGNOSIS — B372 Candidiasis of skin and nail: Secondary | ICD-10-CM

## 2023-12-24 ENCOUNTER — Other Ambulatory Visit: Payer: Self-pay | Admitting: Internal Medicine

## 2023-12-25 DIAGNOSIS — M25562 Pain in left knee: Secondary | ICD-10-CM | POA: Diagnosis not present

## 2023-12-25 DIAGNOSIS — M25561 Pain in right knee: Secondary | ICD-10-CM | POA: Diagnosis not present

## 2024-01-03 ENCOUNTER — Ambulatory Visit: Payer: 59 | Admitting: Family Medicine

## 2024-01-03 ENCOUNTER — Other Ambulatory Visit: Payer: 59

## 2024-01-03 DIAGNOSIS — E039 Hypothyroidism, unspecified: Secondary | ICD-10-CM

## 2024-01-03 DIAGNOSIS — E1169 Type 2 diabetes mellitus with other specified complication: Secondary | ICD-10-CM

## 2024-01-08 ENCOUNTER — Other Ambulatory Visit: Payer: 59

## 2024-01-09 ENCOUNTER — Ambulatory Visit: Payer: 59 | Admitting: Internal Medicine

## 2024-01-10 ENCOUNTER — Ambulatory Visit: Payer: 59 | Admitting: Family Medicine

## 2024-01-10 ENCOUNTER — Other Ambulatory Visit: Payer: 59

## 2024-01-11 DIAGNOSIS — J9611 Chronic respiratory failure with hypoxia: Secondary | ICD-10-CM | POA: Diagnosis not present

## 2024-01-11 DIAGNOSIS — R0609 Other forms of dyspnea: Secondary | ICD-10-CM | POA: Diagnosis not present

## 2024-01-14 ENCOUNTER — Telehealth: Payer: Self-pay | Admitting: Adult Health

## 2024-01-14 NOTE — Telephone Encounter (Signed)
I called pt she needs tubing to feed her oxygen to cpap.  [facility] needs order. Faxed over last order to [facility].  (4 ft tubing and cone).

## 2024-01-14 NOTE — Telephone Encounter (Signed)
Pt called stating that she is needing the special tubing she uses for her cpap machine ordered for her. Pt states that she is needing the smallest one they have. Pt is needing it as soon as possible due to not having any  right now. Please advise.

## 2024-01-15 ENCOUNTER — Ambulatory Visit: Payer: 59

## 2024-01-15 ENCOUNTER — Encounter: Payer: Self-pay | Admitting: Nurse Practitioner

## 2024-01-15 ENCOUNTER — Other Ambulatory Visit: Payer: Self-pay | Admitting: Nurse Practitioner

## 2024-01-15 ENCOUNTER — Ambulatory Visit: Payer: 59 | Attending: Nurse Practitioner | Admitting: Nurse Practitioner

## 2024-01-15 VITALS — BP 124/72 | HR 72 | Ht 62.0 in | Wt 185.0 lb

## 2024-01-15 DIAGNOSIS — I482 Chronic atrial fibrillation, unspecified: Secondary | ICD-10-CM

## 2024-01-15 DIAGNOSIS — I10 Essential (primary) hypertension: Secondary | ICD-10-CM | POA: Diagnosis not present

## 2024-01-15 DIAGNOSIS — I4891 Unspecified atrial fibrillation: Secondary | ICD-10-CM | POA: Diagnosis not present

## 2024-01-15 DIAGNOSIS — E785 Hyperlipidemia, unspecified: Secondary | ICD-10-CM | POA: Diagnosis not present

## 2024-01-15 DIAGNOSIS — J9611 Chronic respiratory failure with hypoxia: Secondary | ICD-10-CM | POA: Diagnosis not present

## 2024-01-15 DIAGNOSIS — G4733 Obstructive sleep apnea (adult) (pediatric): Secondary | ICD-10-CM

## 2024-01-15 DIAGNOSIS — E669 Obesity, unspecified: Secondary | ICD-10-CM

## 2024-01-15 NOTE — Progress Notes (Unsigned)
Cardiology Office Note:  .   Date:  01/15/2024 ID:  Danielle Parrish, DOB 1941-02-07, MRN [id number] PCP: Dettinger, Elige Radon, MD  [facility] Providers Cardiologist:  Marjo Bicker, MD    History of Present Illness: .   Danielle Parrish is a 83 y.o. female with a PMH of PAF, hypertension, hyperlipidemia, type 2 diabetes, chronic hypoxic respiratory failure on oxygen, OSA on CPAP, who presents today for 40-month follow-up appointment.  Newly diagnosed with A-fib in June 2024 while in the pulmonology office.  She was sent to the ED, spontaneously converted to normal sinus rhythm.  Last seen by Dr. Jenene Slicker on September 07, 2023.  She was overall doing well at the time, but pt did note symptomatic DOE.  2-week event monitor was arranged to rule out any intermittent episodes of A-fib.  Lasix was increased to 40 mg daily.  Today she presents for 3 month follow-up. She states she never received the heart monitor after last office visit/wore a heart monitor. She notes recent stress over the past few months. Daughter passed away recently and holiday season was difficult she states, denies any red flag signs/symptoms. Says overall she is doing fairly well. States when she takes off her O2 temporarily, she notes her SpO2 saturation and HR dropping but then both return to normal after putting her O2 back on. Denies any chest pain, palpitations, syncope, presyncope, dizziness, orthopnea, PND, swelling or significant weight changes, acute bleeding, or claudication. Denies any recent worsening SHOB. Tells she she has recently joined an exercise class and enjoying this very much, also will be starting PT soon.  ROS: Negative. See HPI.   Studies Reviewed: .    Echo 06/2022:   1. Left ventricular ejection fraction, by estimation, is 60 to 65%. The  left ventricle has normal function. The left ventricle has no regional  wall motion abnormalities. There is mild left ventricular hypertrophy.   Left ventricular diastolic parameters  are consistent with Grade I diastolic dysfunction (impaired relaxation).  The average left ventricular global longitudinal strain is -19.2 %. The  global longitudinal strain is normal.   2. Right ventricular systolic function is low normal. The right  ventricular size is normal. There is normal pulmonary artery systolic  pressure. The estimated right ventricular systolic pressure is 7.2 mmHg.   3. The mitral valve is degenerative. Trivial mitral valve regurgitation.   4. The aortic valve is tricuspid. There is mild calcification of the  aortic valve. Aortic valve regurgitation is mild. Mild aortic valve  stenosis. Aortic regurgitation PHT measures 1034 msec. Aortic valve area,  by VTI measures 1.86 cm. Aortic valve  mean gradient measures 9.0 mmHg.   5. The inferior vena cava is normal in size with greater than 50%  respiratory variability, suggesting right atrial pressure of 3 mmHg.   Comparison(s): Prior images unable to be directly viewed.  Holter monitor 09/2015:  48-hour Holter monitor reviewed. Sinus rhythm present with heart rate ranging from 36 bpm just briefly up to 90 bpm, average heart rate 56 bpm. Lowest sustained heart rate was in the mid 40s. Occasional PACs and PVCs were noted. Brief bursts of PAT ranging 5-7 beats also noted. There were no significant pauses, no atrial fibrillation. No obvious correlating symptoms described.  Physical Exam:   VS:  BP 124/72   Pulse 72   Ht 5\' 2"  (1.575 m)   Wt 185 lb (83.9 kg)   SpO2 96% Comment: 5 litters;  BMI 33.84 kg/m  Wt Readings from Last 3 Encounters:  01/15/24 185 lb (83.9 kg)  11/01/23 183 lb (83 kg)  10/03/23 178 lb (80.7 kg)    GEN: Obese, 83 y.o. female in no acute distress, wearing chronic O2 NECK: No JVD; No carotid bruits CARDIAC: S1/S2, RRR, no murmurs, rubs, gallops RESPIRATORY:  Clear to auscultation without rales, wheezing or rhonchi  ABDOMEN: Soft, non-tender,  non-distended EXTREMITIES:  No edema; No deformity   ASSESSMENT AND PLAN: .    A-fib Denies any tachycardia or palpitations. Admits to HR dropping when her O2 is off - see HPI. Will arrange 2 week Zio XT monitor. Continue Eliquis 5 mg BID for stroke prevention, denies any bleeding issues. On appropriate dosage. Continue Toprol XL.   HTN Blood pressure stable and well-controlled. Discussed to monitor BP at home at least 2 hours after medications and sitting for 5-10 minutes.  No medication changes at this time. Heart healthy diet and regular cardiovascular exercise encouraged.   HLD LDL 73 03/2023.  This is being managed by PCP.  Continue lovastatin. Heart healthy diet and regular cardiovascular exercise encouraged.   Chronic hypoxic respiratory failure on oxygen, OSA on CPAP Denies any worsening symptoms.  Breathing is stable per her report.  She is compliant with CPAP.  Continue to follow-up with PCP and Dr. Sherene Sires.   Obesity Weight loss via diet and exercise encouraged. Discussed the impact being overweight would have on cardiovascular risk.    Dispo: Follow-up with me/[facility] in 6 to 8 weeks or sooner anything changes.  Signed, Sharlene Dory, NP

## 2024-01-15 NOTE — Patient Instructions (Addendum)
Medication Instructions Your physician recommends that you continue on your current medications as directed. Please refer to the Current Medication list given to you today.  Labwork: None  Testing/Procedures:  ZIO- Long Term Monitor Instructions   Your physician has requested you wear your ZIO patch monitor 14 days.   This is a single patch monitor.  [employer] supplies one patch monitor per enrollment.  Additional stickers are not available.   Please do not apply patch if you will be having a Nuclear Stress Test, Echocardiogram, Cardiac CT, MRI, or Chest Xray during the time frame you would be wearing the monitor. The patch cannot be worn during these tests.  You cannot remove and re-apply the ZIO XT patch monitor.   Your ZIO patch monitor will be sent USPS Priority mail from [facility] directly to your home address. The monitor may also be mailed to a PO BOX if home delivery is not available.   It may take 3-5 days to receive your monitor after you have been enrolled.   Once you have received you monitor, please review enclosed instructions.  Your monitor has already been registered assigning a specific monitor serial # to you.   Applying the monitor   Shave hair from upper left chest.   Hold abrader disc by orange tab.  Rub abrader in 40 strokes over left upper chest as indicated in your monitor instructions.   Clean area with 4 enclosed alcohol pads .  Use all pads to assure are is cleaned thoroughly.  Let dry.   Apply patch as indicated in monitor instructions.  Patch will be place under collarbone on left side of chest with arrow pointing upward.   Rub patch adhesive wings for 2 minutes.Remove white label marked "1".  Remove white label marked "2".  Rub patch adhesive wings for 2 additional minutes.   While looking in a mirror, press and release button in center of patch.  A small green light will flash 3-4 times .  This will be your only indicator the monitor has been  turned on.     Do not shower for the first 24 hours.  You may shower after the first 24 hours.   Press button if you feel a symptom. You will hear a small click.  Record Date, Time and Symptom in the Patient Log Book.   When you are ready to remove patch, follow instructions on last 2 pages of Patient Log Book.  Stick patch monitor onto last page of Patient Log Book.   Place Patient Log Book in Rewey box.  Use locking tab on box and tape box closed securely.  The [employer] and [employer] has [employer] on it.  Please place in mailbox as soon as possible.  Your physician should have your test results approximately 7 days after the monitor has been mailed back to [facility].   Call [facility] Customer Care at [phone] if you have questions regarding your ZIO XT patch monitor.  Call them immediately if you see an orange light blinking on your monitor.   If your monitor falls off in less than 4 days contact our Monitor department at [phone].  If your monitor becomes loose or falls off after 4 days call [employer] at [phone] for suggestions on securing your monitor.  Follow-Up: Your physician recommends that you schedule a follow-up appointment in: 6-8 weeks  Any Other Special Instructions Will Be Listed Below (If Applicable).  If you need a refill on your cardiac medications  before your next appointment, please call your pharmacy

## 2024-01-15 NOTE — Telephone Encounter (Signed)
I called [facility] and they did receive order and sent along to appropriate person.  I LMVM for pt as well.  She is welcome to call them if she has not heard from them (I say usually a week) but she can call sooner if she likes.

## 2024-01-16 ENCOUNTER — Ambulatory Visit: Payer: 59 | Attending: Orthopedic Surgery

## 2024-01-16 ENCOUNTER — Other Ambulatory Visit: Payer: Self-pay

## 2024-01-16 DIAGNOSIS — G8929 Other chronic pain: Secondary | ICD-10-CM | POA: Insufficient documentation

## 2024-01-16 DIAGNOSIS — R2681 Unsteadiness on feet: Secondary | ICD-10-CM | POA: Insufficient documentation

## 2024-01-16 DIAGNOSIS — M25562 Pain in left knee: Secondary | ICD-10-CM | POA: Diagnosis not present

## 2024-01-16 DIAGNOSIS — M25561 Pain in right knee: Secondary | ICD-10-CM | POA: Diagnosis not present

## 2024-01-16 DIAGNOSIS — G4733 Obstructive sleep apnea (adult) (pediatric): Secondary | ICD-10-CM | POA: Diagnosis not present

## 2024-01-16 NOTE — Therapy (Signed)
OUTPATIENT PHYSICAL THERAPY LOWER EXTREMITY EVALUATION   Patient Name: Danielle Parrish MRN: [id number] DOB:Jul 04, 1941, 83 y.o., female Today's Date: 01/16/2024  END OF SESSION:  PT End of Session - 01/16/24 1343     Visit Number 1    Number of Visits 12    Date for PT Re-Evaluation 03/21/24    PT Start Time 1346    PT Stop Time 1422    PT Time Calculation (min) 36 min    Equipment Utilized During Treatment Oxygen    Activity Tolerance Patient tolerated treatment well    Behavior During Therapy WFL for tasks assessed/performed             Past Medical History:  Diagnosis Date   Anxiety    Arthritis    CHF (congestive heart failure) (HCC)    Chronic pain of multiple joints    Depression    Essential hypertension    GERD (gastroesophageal reflux disease)    Hyperlipidemia    Hyperlipidemia associated with type 2 diabetes mellitus (HCC)    Hypertension associated with type 2 diabetes mellitus (HCC)    Hypothyroidism    Mild scoliosis 10/19/2021   Osteoporosis    Sleep apnea    CPAP   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, left 09/05/2023   left wrist   needs excision   Squamous cell carcinoma of skin 09/05/2023   left ear   needs mohs   Type 2 diabetes mellitus with hypercholesterolemia (HCC)    Vitamin D deficiency    Past Surgical History:  Procedure Laterality Date   ABDOMINAL HERNIA REPAIR     ABDOMINAL HYSTERECTOMY  1977   BREAST EXCISIONAL BIOPSY Right    EYE SURGERY  2020   bilateral cataract removal    KNEE ARTHROSCOPY Left 09/30/2014   Procedure: LEFT KNEE ARTHROSCOPY MEDIAL MENISECTOMY ABRASION CHONDROPLASTY SYNOVECTOMY SUPRAPATELLER CUFF;  Surgeon: Jacki Cones, MD;  Location: [facility];  Service: Orthopedics;  Laterality: Left;   Patient Active Problem List   Diagnosis Date Noted   Chronic anticoagulation 09/07/2023   Paroxysmal atrial fibrillation (HCC) 06/19/2023   Hypercoagulable state due to paroxysmal atrial fibrillation (HCC) 06/19/2023    Chronic respiratory failure with hypoxia (HCC) 07/10/2022   Chronic midline low back pain without sciatica 01/30/2019   Age-related osteoporosis with current pathol fracture of vertebra (HCC) 10/28/2018   Calcification of aorta (HCC) 10/02/2018   Compression fracture of T12 vertebra (HCC) 10/01/2018   Primary osteoarthritis of right knee 07/20/2017   Varicose veins of left lower extremity with complications 07/31/2016   OSA (obstructive sleep apnea) 09/14/2015   Chronic diastolic heart failure (HCC) 09/10/2015   Dysphagia, pharyngoesophageal phase 08/04/2015   Anorexia 08/04/2015   Malaise and fatigue 08/04/2015   Depression, recurrent (HCC) 04/08/2015   Vitamin D deficiency    Anxiety    GERD (gastroesophageal reflux disease)    Arthritis    Hypertension associated with type 2 diabetes mellitus (HCC) 05/15/2013   Hyperlipidemia associated with type 2 diabetes mellitus (HCC) 05/15/2013   Type 2 diabetes mellitus with hypercholesterolemia (HCC) 05/15/2013   Morbid obesity (HCC) 05/15/2013   Hypothyroidism 05/15/2013    PCP: Dettinger, Elige Radon, MD  REFERRING PROVIDER: Ranee Gosselin, MD   REFERRING DIAG: Right knee pain, Left knee pain   THERAPY DIAG:  Unsteadiness on feet  Chronic pain of right knee  Chronic pain of left knee  Rationale for Evaluation and Treatment: Rehabilitation  ONSET DATE: "years ago"   SUBJECTIVE:  SUBJECTIVE STATEMENT: Patient reports that she has been having trouble getting around for a while now. She notes that she has had multiple surgeries on her knees previously, but it has not gotten any better. She had an injection in her knees. She is currently wearing a heart monitor as she went into atrial fibrillation a few months ago. She is also having to use oxygen at all times now. She has begun doing some exercise classes and she went yesterday for the first time in a while.   PERTINENT HISTORY: Hypertension, diabetes type 2, chronic diastolic  heart failure, atrial fibrillation, arthritis, osteoporosis, anxiety, depression, and requires portable oxygen PAIN:  Are you having pain? Yes: NPRS scale: Current: 0/10 Best: 0/10 Worst: 9/10 Pain location: both knees (Left knee worse than the right)  Pain description: intermittent sharp pain  Aggravating factors: cold weather Relieving factors: medication and injections  PRECAUTIONS: None  RED FLAGS: None   WEIGHT BEARING RESTRICTIONS: No  FALLS:  Has patient fallen in last 6 months? No  LIVING ENVIRONMENT: Lives with: lives with their family Lives in: House/apartment Stairs: Yes: Internal: 20 steps; on right going up; step to pattern Has following equipment at home: Single point cane (patient only uses the cane when she is away from home)   OCCUPATION: retired  PLOF: Independent  PATIENT GOALS: walk better  NEXT MD VISIT: none scheduled  OBJECTIVE:  Note: Objective measures were completed at Evaluation unless otherwise noted.  COGNITION: Overall cognitive status: Within functional limits for tasks assessed     SENSATION: Patient reports no numbness or tingling  EDEMA:  No significant edema observed  POSTURE: rounded shoulders, forward head, decreased lumbar lordosis, and increased thoracic kyphosis  PALPATION: No tenderness to palpation reported  LOWER EXTREMITY ROM: assessed in sitting  Active ROM Right eval Left eval  Hip flexion    Hip extension    Hip abduction    Hip adduction    Hip internal rotation    Hip external rotation    Knee flexion 117 107  Knee extension 6 11  Ankle dorsiflexion    Ankle plantarflexion    Ankle inversion    Ankle eversion     (Blank rows = not tested)  LOWER EXTREMITY MMT:  MMT Right eval Left eval  Hip flexion 4-/5 4+/5  Hip extension    Hip abduction    Hip adduction    Hip internal rotation    Hip external rotation    Knee flexion 4+/5 4+/5  Knee extension 4+/5 5/5  Ankle dorsiflexion 4/5 4/5   Ankle plantarflexion    Ankle inversion    Ankle eversion     (Blank rows = not tested)  FUNCTIONAL TESTS:  5 times sit to stand: 28.17 seconds with single armrest and SPC  Timed up and go (TUG): 28.32 seconds with SPC and oxygen  GAIT: Assistive device utilized: Single point cane Level of assistance: Modified independence Comments: Decreased gait speed and stride length with poor foot clearance, heel strike, and toe off bilaterally  TREATMENT DATE:     PATIENT EDUCATION:  Education details: Plan of care, prognosis, balance, objective findings, and goals for physical therapy Person educated: Patient Education method: Explanation Education comprehension: verbalized understanding  HOME EXERCISE PROGRAM:   ASSESSMENT:  CLINICAL IMPRESSION: Patient is a 83 y.o. female who was seen today for physical therapy evaluation and treatment for bilateral knee pain. She presented with low pain severity and irritability as none of today's assessments significantly reproduced her familiar symptoms. However, she is at a high risk of falling as evidenced by her gait mechanics and objective findings. Recommend that she continue with skilled physical therapy to address her impairments to maximize her safety and functional mobility.  OBJECTIVE IMPAIRMENTS: Abnormal gait, decreased activity tolerance, decreased balance, decreased mobility, difficulty walking, decreased ROM, decreased strength, and pain.   ACTIVITY LIMITATIONS: carrying, lifting, squatting, stairs, transfers, and locomotion level  PARTICIPATION LIMITATIONS: meal prep, cleaning, laundry, shopping, and community activity  PERSONAL FACTORS: Age, Past/current experiences, Time since onset of injury/illness/exacerbation, Transportation, and 3+ comorbidities: Hypertension, diabetes type 2, chronic diastolic heart  failure, atrial fibrillation, arthritis, osteoporosis, anxiety, depression, and requires portable oxygen  are also affecting patient's functional outcome.   REHAB POTENTIAL: Fair    CLINICAL DECISION MAKING: Evolving/moderate complexity  EVALUATION COMPLEXITY: Moderate   GOALS: Goals reviewed with patient? Yes  SHORT TERM GOALS: Target date: 02/06/24 Patient will be independent with her initial HEP. Baseline: Goal status: INITIAL  2.  Patient will improve her 5 times sit to stand time to 21 seconds or less for improved lower extremity power. Baseline:  Goal status: INITIAL  3.  Patient will improve her timed up and go time to 21 seconds or less for improved functional mobility. Baseline:  Goal status: INITIAL  LONG TERM GOALS: Target date: 02/27/24  Patient will be independent with her advanced HEP. Baseline:  Goal status: INITIAL  2.  Patient will be able to transfer with minimal to no upper extremity support for improved independence. Baseline:  Goal status: INITIAL  3.  Patient will improve her timed up and go time to 15 seconds or less for improved safety and reduced fall risk. Baseline:  Goal status: INITIAL  4.  Patient will improve her 5 times sit to stand time to 15 seconds or less to reduce her fall risk. Baseline:  Goal status: INITIAL  PLAN:  PT FREQUENCY: 2x/week  PT DURATION: 6 weeks  PLANNED INTERVENTIONS: 97164- PT Re-evaluation, 97110-Therapeutic exercises, 97530- Therapeutic activity, 97112- Neuromuscular re-education, 97535- Self Care, 40981- Manual therapy, [phone]- Gait training, Patient/Family education, Balance training, Stair training, Joint mobilization, Cryotherapy, and Moist heat  PLAN FOR NEXT SESSION: Nustep, lower extremity strengthening, gait training, and manual therapy as needed   Granville Lewis, PT 01/16/2024, 3:54 PM

## 2024-01-21 ENCOUNTER — Ambulatory Visit: Payer: 59

## 2024-01-21 DIAGNOSIS — R2681 Unsteadiness on feet: Secondary | ICD-10-CM | POA: Diagnosis not present

## 2024-01-21 DIAGNOSIS — M25562 Pain in left knee: Secondary | ICD-10-CM | POA: Diagnosis not present

## 2024-01-21 DIAGNOSIS — G8929 Other chronic pain: Secondary | ICD-10-CM

## 2024-01-21 DIAGNOSIS — M25561 Pain in right knee: Secondary | ICD-10-CM | POA: Diagnosis not present

## 2024-01-21 NOTE — Therapy (Signed)
OUTPATIENT PHYSICAL THERAPY LOWER EXTREMITY TREATMENT   Patient Name: Danielle Parrish MRN: [id number] DOB:1941/03/14, 83 y.o., female Today's Date: 01/21/2024  END OF SESSION:  PT End of Session - 01/21/24 1432     Visit Number 2    Number of Visits 12    Date for PT Re-Evaluation 03/21/24    PT Start Time 1430    Equipment Utilized During Treatment Oxygen    Activity Tolerance Patient tolerated treatment well    Behavior During Therapy [facility] for tasks assessed/performed             Past Medical History:  Diagnosis Date   Anxiety    Arthritis    CHF (congestive heart failure) (HCC)    Chronic pain of multiple joints    Depression    Essential hypertension    GERD (gastroesophageal reflux disease)    Hyperlipidemia    Hyperlipidemia associated with type 2 diabetes mellitus (HCC)    Hypertension associated with type 2 diabetes mellitus (HCC)    Hypothyroidism    Mild scoliosis 10/19/2021   Osteoporosis    Sleep apnea    CPAP   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, left 09/05/2023   left wrist   needs excision   Squamous cell carcinoma of skin 09/05/2023   left ear   needs mohs   Type 2 diabetes mellitus with hypercholesterolemia (HCC)    Vitamin D deficiency    Past Surgical History:  Procedure Laterality Date   ABDOMINAL HERNIA REPAIR     ABDOMINAL HYSTERECTOMY  1977   BREAST EXCISIONAL BIOPSY Right    EYE SURGERY  2020   bilateral cataract removal    KNEE ARTHROSCOPY Left 09/30/2014   Procedure: LEFT KNEE ARTHROSCOPY MEDIAL MENISECTOMY ABRASION CHONDROPLASTY SYNOVECTOMY SUPRAPATELLER CUFF;  Surgeon: Jacki Cones, MD;  Location: [facility];  Service: Orthopedics;  Laterality: Left;   Patient Active Problem List   Diagnosis Date Noted   Chronic anticoagulation 09/07/2023   Paroxysmal atrial fibrillation (HCC) 06/19/2023   Hypercoagulable state due to paroxysmal atrial fibrillation (HCC) 06/19/2023   Chronic respiratory failure with hypoxia (HCC) 07/10/2022    Chronic midline low back pain without sciatica 01/30/2019   Age-related osteoporosis with current pathol fracture of vertebra (HCC) 10/28/2018   Calcification of aorta (HCC) 10/02/2018   Compression fracture of T12 vertebra (HCC) 10/01/2018   Primary osteoarthritis of right knee 07/20/2017   Varicose veins of left lower extremity with complications 07/31/2016   OSA (obstructive sleep apnea) 09/14/2015   Chronic diastolic heart failure (HCC) 09/10/2015   Dysphagia, pharyngoesophageal phase 08/04/2015   Anorexia 08/04/2015   Malaise and fatigue 08/04/2015   Depression, recurrent (HCC) 04/08/2015   Vitamin D deficiency    Anxiety    GERD (gastroesophageal reflux disease)    Arthritis    Hypertension associated with type 2 diabetes mellitus (HCC) 05/15/2013   Hyperlipidemia associated with type 2 diabetes mellitus (HCC) 05/15/2013   Type 2 diabetes mellitus with hypercholesterolemia (HCC) 05/15/2013   Morbid obesity (HCC) 05/15/2013   Hypothyroidism 05/15/2013    PCP: Dettinger, Elige Radon, MD  REFERRING PROVIDER: Ranee Gosselin, MD   REFERRING DIAG: Right knee pain, Left knee pain   THERAPY DIAG:  Unsteadiness on feet  Chronic pain of right knee  Chronic pain of left knee  Rationale for Evaluation and Treatment: Rehabilitation  ONSET DATE: "years ago"   SUBJECTIVE:   SUBJECTIVE STATEMENT: Pt denies any pain today.  Pt ambulates into the facility with mobile O2.  PERTINENT HISTORY: Hypertension, diabetes type 2, chronic diastolic heart failure, atrial fibrillation, arthritis, osteoporosis, anxiety, depression, and requires portable oxygen PAIN:  Are you having pain? Yes: NPRS scale: Current: 0/10 Best: 0/10 Worst: 9/10 Pain location: both knees (Left knee worse than the right)  Pain description: intermittent sharp pain  Aggravating factors: cold weather Relieving factors: medication and injections  PRECAUTIONS: None  RED FLAGS: None   WEIGHT BEARING  RESTRICTIONS: No  FALLS:  Has patient fallen in last 6 months? No  LIVING ENVIRONMENT: Lives with: lives with their family Lives in: House/apartment Stairs: Yes: Internal: 20 steps; on right going up; step to pattern Has following equipment at home: Single point cane (patient only uses the cane when she is away from home)   OCCUPATION: retired  PLOF: Independent  PATIENT GOALS: walk better  NEXT MD VISIT: none scheduled  OBJECTIVE:  Note: Objective measures were completed at Evaluation unless otherwise noted.  COGNITION: Overall cognitive status: Within functional limits for tasks assessed     SENSATION: Patient reports no numbness or tingling  EDEMA:  No significant edema observed  POSTURE: rounded shoulders, forward head, decreased lumbar lordosis, and increased thoracic kyphosis  PALPATION: No tenderness to palpation reported  LOWER EXTREMITY ROM: assessed in sitting  Active ROM Right eval Left eval  Hip flexion    Hip extension    Hip abduction    Hip adduction    Hip internal rotation    Hip external rotation    Knee flexion 117 107  Knee extension 6 11  Ankle dorsiflexion    Ankle plantarflexion    Ankle inversion    Ankle eversion     (Blank rows = not tested)  LOWER EXTREMITY MMT:  MMT Right eval Left eval  Hip flexion 4-/5 4+/5  Hip extension    Hip abduction    Hip adduction    Hip internal rotation    Hip external rotation    Knee flexion 4+/5 4+/5  Knee extension 4+/5 5/5  Ankle dorsiflexion 4/5 4/5  Ankle plantarflexion    Ankle inversion    Ankle eversion     (Blank rows = not tested)  FUNCTIONAL TESTS:  5 times sit to stand: 28.17 seconds with single armrest and SPC  Timed up and go (TUG): 28.32 seconds with SPC and oxygen  GAIT: Assistive device utilized: Single point cane Level of assistance: Modified independence Comments: Decreased gait speed and stride length with poor foot clearance, heel strike, and toe off  bilaterally                                                                                                                                TREATMENT DATE:                 01/21/24                   EXERCISE LOG  Exercise Repetitions and  Resistance Comments  Nustep Lvl 1 x 10 mins   Rockerboard  (Seated) 3 mins   LAQs 2# x 20 reps bils   Seated Marches 2# x 15 reps bils   Seated Hip Adduction 3 mins   Seated Hip Abduction Red x 3 mins   Seated Ham Curls Red x 20 reps bil    Blank cell = exercise not performed today   PATIENT EDUCATION:  Education details: Plan of care, prognosis, balance, objective findings, and goals for physical therapy Person educated: Patient Education method: Explanation Education comprehension: verbalized understanding  HOME EXERCISE PROGRAM:   ASSESSMENT:  CLINICAL IMPRESSION: Pt arrives for today's treatment session denying any pain.  Pt with O2 donned throughout treatment session.  Pt given seated rest breaks as needed due to fatigue.  Pt instructed in seated exercises today to increase strength and function.  Pt requiring min cues for proper technique and posture.  Pt denied any pain at completion of today's treatment session.  OBJECTIVE IMPAIRMENTS: Abnormal gait, decreased activity tolerance, decreased balance, decreased mobility, difficulty walking, decreased ROM, decreased strength, and pain.   ACTIVITY LIMITATIONS: carrying, lifting, squatting, stairs, transfers, and locomotion level  PARTICIPATION LIMITATIONS: meal prep, cleaning, laundry, shopping, and community activity  PERSONAL FACTORS: Age, Past/current experiences, Time since onset of injury/illness/exacerbation, Transportation, and 3+ comorbidities: Hypertension, diabetes type 2, chronic diastolic heart failure, atrial fibrillation, arthritis, osteoporosis, anxiety, depression, and requires portable oxygen  are also affecting patient's functional outcome.   REHAB POTENTIAL: Fair     CLINICAL DECISION MAKING: Evolving/moderate complexity  EVALUATION COMPLEXITY: Moderate   GOALS: Goals reviewed with patient? Yes  SHORT TERM GOALS: Target date: 02/06/24 Patient will be independent with her initial HEP. Baseline: Goal status: INITIAL  2.  Patient will improve her 5 times sit to stand time to 21 seconds or less for improved lower extremity power. Baseline:  Goal status: INITIAL  3.  Patient will improve her timed up and go time to 21 seconds or less for improved functional mobility. Baseline:  Goal status: INITIAL  LONG TERM GOALS: Target date: 02/27/24  Patient will be independent with her advanced HEP. Baseline:  Goal status: INITIAL  2.  Patient will be able to transfer with minimal to no upper extremity support for improved independence. Baseline:  Goal status: INITIAL  3.  Patient will improve her timed up and go time to 15 seconds or less for improved safety and reduced fall risk. Baseline:  Goal status: INITIAL  4.  Patient will improve her 5 times sit to stand time to 15 seconds or less to reduce her fall risk. Baseline:  Goal status: INITIAL  PLAN:  PT FREQUENCY: 2x/week  PT DURATION: 6 weeks  PLANNED INTERVENTIONS: 97164- PT Re-evaluation, 97110-Therapeutic exercises, 97530- Therapeutic activity, 97112- Neuromuscular re-education, 97535- Self Care, 19147- Manual therapy, [phone]- Gait training, Patient/Family education, Balance training, Stair training, Joint mobilization, Cryotherapy, and Moist heat  PLAN FOR NEXT SESSION: Nustep, lower extremity strengthening, gait training, and manual therapy as needed   Newman Pies, PTA 01/21/2024, 3:13 PM

## 2024-01-23 ENCOUNTER — Ambulatory Visit: Payer: 59

## 2024-01-23 DIAGNOSIS — G8929 Other chronic pain: Secondary | ICD-10-CM | POA: Diagnosis not present

## 2024-01-23 DIAGNOSIS — R2681 Unsteadiness on feet: Secondary | ICD-10-CM

## 2024-01-23 DIAGNOSIS — M25562 Pain in left knee: Secondary | ICD-10-CM | POA: Diagnosis not present

## 2024-01-23 DIAGNOSIS — M25561 Pain in right knee: Secondary | ICD-10-CM | POA: Diagnosis not present

## 2024-01-23 NOTE — Therapy (Signed)
OUTPATIENT PHYSICAL THERAPY LOWER EXTREMITY TREATMENT   Patient Name: Danielle Parrish MRN: [id number] DOB:21-Jul-1941, 83 y.o., female Today's Date: 01/23/2024  END OF SESSION:  PT End of Session - 01/23/24 1437     Visit Number 3    Number of Visits 12    Date for PT Re-Evaluation 03/21/24    PT Start Time 1430    PT Stop Time 1515    PT Time Calculation (min) 45 min    Equipment Utilized During Treatment Oxygen    Activity Tolerance Patient tolerated treatment well    Behavior During Therapy WFL for tasks assessed/performed              Past Medical History:  Diagnosis Date   Anxiety    Arthritis    CHF (congestive heart failure) (HCC)    Chronic pain of multiple joints    Depression    Essential hypertension    GERD (gastroesophageal reflux disease)    Hyperlipidemia    Hyperlipidemia associated with type 2 diabetes mellitus (HCC)    Hypertension associated with type 2 diabetes mellitus (HCC)    Hypothyroidism    Mild scoliosis 10/19/2021   Osteoporosis    Sleep apnea    CPAP   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, left 09/05/2023   left wrist   needs excision   Squamous cell carcinoma of skin 09/05/2023   left ear   needs mohs   Type 2 diabetes mellitus with hypercholesterolemia (HCC)    Vitamin D deficiency    Past Surgical History:  Procedure Laterality Date   ABDOMINAL HERNIA REPAIR     ABDOMINAL HYSTERECTOMY  1977   BREAST EXCISIONAL BIOPSY Right    EYE SURGERY  2020   bilateral cataract removal    KNEE ARTHROSCOPY Left 09/30/2014   Procedure: LEFT KNEE ARTHROSCOPY MEDIAL MENISECTOMY ABRASION CHONDROPLASTY SYNOVECTOMY SUPRAPATELLER CUFF;  Surgeon: Jacki Cones, MD;  Location: [facility];  Service: Orthopedics;  Laterality: Left;   Patient Active Problem List   Diagnosis Date Noted   Chronic anticoagulation 09/07/2023   Paroxysmal atrial fibrillation (HCC) 06/19/2023   Hypercoagulable state due to paroxysmal atrial fibrillation (HCC) 06/19/2023    Chronic respiratory failure with hypoxia (HCC) 07/10/2022   Chronic midline low back pain without sciatica 01/30/2019   Age-related osteoporosis with current pathol fracture of vertebra (HCC) 10/28/2018   Calcification of aorta (HCC) 10/02/2018   Compression fracture of T12 vertebra (HCC) 10/01/2018   Primary osteoarthritis of right knee 07/20/2017   Varicose veins of left lower extremity with complications 07/31/2016   OSA (obstructive sleep apnea) 09/14/2015   Chronic diastolic heart failure (HCC) 09/10/2015   Dysphagia, pharyngoesophageal phase 08/04/2015   Anorexia 08/04/2015   Malaise and fatigue 08/04/2015   Depression, recurrent (HCC) 04/08/2015   Vitamin D deficiency    Anxiety    GERD (gastroesophageal reflux disease)    Arthritis    Hypertension associated with type 2 diabetes mellitus (HCC) 05/15/2013   Hyperlipidemia associated with type 2 diabetes mellitus (HCC) 05/15/2013   Type 2 diabetes mellitus with hypercholesterolemia (HCC) 05/15/2013   Morbid obesity (HCC) 05/15/2013   Hypothyroidism 05/15/2013    PCP: Dettinger, Elige Radon, MD  REFERRING PROVIDER: Ranee Gosselin, MD   REFERRING DIAG: Right knee pain, Left knee pain   THERAPY DIAG:  Unsteadiness on feet  Chronic pain of right knee  Chronic pain of left knee  Rationale for Evaluation and Treatment: Rehabilitation  ONSET DATE: "years ago"   SUBJECTIVE:  SUBJECTIVE STATEMENT: Patient reports that she feels good today. She felt good after her last appointment.   PERTINENT HISTORY: Hypertension, diabetes type 2, chronic diastolic heart failure, atrial fibrillation, arthritis, osteoporosis, anxiety, depression, and requires portable oxygen PAIN:  Are you having pain? Yes: NPRS scale: Current: 0/10 Best: 0/10 Worst: 9/10 Pain location: both knees (Left knee worse than the right)  Pain description: intermittent sharp pain  Aggravating factors: cold weather Relieving factors: medication and  injections  PRECAUTIONS: None  RED FLAGS: None   WEIGHT BEARING RESTRICTIONS: No  FALLS:  Has patient fallen in last 6 months? No  LIVING ENVIRONMENT: Lives with: lives with their family Lives in: House/apartment Stairs: Yes: Internal: 20 steps; on right going up; step to pattern Has following equipment at home: Single point cane (patient only uses the cane when she is away from home)   OCCUPATION: retired  PLOF: Independent  PATIENT GOALS: walk better  NEXT MD VISIT: none scheduled  OBJECTIVE:  Note: Objective measures were completed at Evaluation unless otherwise noted.  COGNITION: Overall cognitive status: Within functional limits for tasks assessed     SENSATION: Patient reports no numbness or tingling  EDEMA:  No significant edema observed  POSTURE: rounded shoulders, forward head, decreased lumbar lordosis, and increased thoracic kyphosis  PALPATION: No tenderness to palpation reported  LOWER EXTREMITY ROM: assessed in sitting  Active ROM Right eval Left eval  Hip flexion    Hip extension    Hip abduction    Hip adduction    Hip internal rotation    Hip external rotation    Knee flexion 117 107  Knee extension 6 11  Ankle dorsiflexion    Ankle plantarflexion    Ankle inversion    Ankle eversion     (Blank rows = not tested)  LOWER EXTREMITY MMT:  MMT Right eval Left eval  Hip flexion 4-/5 4+/5  Hip extension    Hip abduction    Hip adduction    Hip internal rotation    Hip external rotation    Knee flexion 4+/5 4+/5  Knee extension 4+/5 5/5  Ankle dorsiflexion 4/5 4/5  Ankle plantarflexion    Ankle inversion    Ankle eversion     (Blank rows = not tested)  FUNCTIONAL TESTS:  5 times sit to stand: 28.17 seconds with single armrest and SPC  Timed up and go (TUG): 28.32 seconds with SPC and oxygen  GAIT: Assistive device utilized: Single point cane Level of assistance: Modified independence Comments: Decreased gait speed and  stride length with poor foot clearance, heel strike, and toe off bilaterally                                                                                                                                TREATMENT DATE:  01/23/24 EXERCISE LOG  Exercise Repetitions and Resistance Comments  Nustep  L4 x 17 minutes   LAQ 3# x 3 minutes    Marching on foam  3 minutes  BUE support  Step up  6" step x 10 reps each  With BUE support  Seated marching 3# x 2.5 minutes    Blank cell = exercise not performed today                  01/21/24                   EXERCISE LOG  Exercise Repetitions and Resistance Comments  Nustep Lvl 1 x 10 mins   Rockerboard  (Seated) 3 mins   LAQs 2# x 20 reps bils   Seated Marches 2# x 15 reps bils   Seated Hip Adduction 3 mins   Seated Hip Abduction Red x 3 mins   Seated Ham Curls Red x 20 reps bil    Blank cell = exercise not performed today   PATIENT EDUCATION:  Education details: progress with physical therapy and benefits of exercise Person educated: Patient Education method: Explanation Education comprehension: verbalized understanding  HOME EXERCISE PROGRAM:   ASSESSMENT:  CLINICAL IMPRESSION: Patient was progressed with marching on foam and step ups for improved household mobility. She required minimal cueing with marching on foam for upright stance to promote improved awareness of her surroundings. She experienced a mild increase in left knee pain with step ups when leading with the left lower extremity. However, this did not persist into any of today's other interventions. She reported feeling tired upon the conclusion of treatment. She continues to require skilled physical therapy to address her remaining impairments to maximize her safety and functional    OBJECTIVE IMPAIRMENTS: Abnormal gait, decreased activity tolerance, decreased balance, decreased mobility, difficulty walking, decreased ROM, decreased  strength, and pain.   ACTIVITY LIMITATIONS: carrying, lifting, squatting, stairs, transfers, and locomotion level  PARTICIPATION LIMITATIONS: meal prep, cleaning, laundry, shopping, and community activity  PERSONAL FACTORS: Age, Past/current experiences, Time since onset of injury/illness/exacerbation, Transportation, and 3+ comorbidities: Hypertension, diabetes type 2, chronic diastolic heart failure, atrial fibrillation, arthritis, osteoporosis, anxiety, depression, and requires portable oxygen  are also affecting patient's functional outcome.   REHAB POTENTIAL: Fair    CLINICAL DECISION MAKING: Evolving/moderate complexity  EVALUATION COMPLEXITY: Moderate   GOALS: Goals reviewed with patient? Yes  SHORT TERM GOALS: Target date: 02/06/24 Patient will be independent with her initial HEP. Baseline: Goal status: INITIAL  2.  Patient will improve her 5 times sit to stand time to 21 seconds or less for improved lower extremity power. Baseline:  Goal status: INITIAL  3.  Patient will improve her timed up and go time to 21 seconds or less for improved functional mobility. Baseline:  Goal status: INITIAL  LONG TERM GOALS: Target date: 02/27/24  Patient will be independent with her advanced HEP. Baseline:  Goal status: INITIAL  2.  Patient will be able to transfer with minimal to no upper extremity support for improved independence. Baseline:  Goal status: INITIAL  3.  Patient will improve her timed up and go time to 15 seconds or less for improved safety and reduced fall risk. Baseline:  Goal status: INITIAL  4.  Patient will improve her 5 times sit to stand time to 15 seconds or less to reduce her fall risk. Baseline:  Goal status: INITIAL  PLAN:  PT FREQUENCY: 2x/week  PT DURATION: 6 weeks  PLANNED  INTERVENTIONS: 97164- PT Re-evaluation, 97110-Therapeutic exercises, 97530- Therapeutic activity, 97112- Neuromuscular re-education, 97535- Self Care, 82956- Manual therapy,  [phone]- Gait training, Patient/Family education, Balance training, Stair training, Joint mobilization, Cryotherapy, and Moist heat  PLAN FOR NEXT SESSION: Nustep, lower extremity strengthening, gait training, and manual therapy as needed   Granville Lewis, PT 01/23/2024, 3:48 PM

## 2024-01-24 ENCOUNTER — Encounter: Payer: Self-pay | Admitting: Family Medicine

## 2024-01-24 ENCOUNTER — Ambulatory Visit (INDEPENDENT_AMBULATORY_CARE_PROVIDER_SITE_OTHER): Payer: 59 | Admitting: Family Medicine

## 2024-01-24 ENCOUNTER — Other Ambulatory Visit: Payer: 59

## 2024-01-24 VITALS — BP 135/71 | HR 62 | Ht 62.0 in | Wt 187.0 lb

## 2024-01-24 DIAGNOSIS — E1169 Type 2 diabetes mellitus with other specified complication: Secondary | ICD-10-CM

## 2024-01-24 DIAGNOSIS — E039 Hypothyroidism, unspecified: Secondary | ICD-10-CM | POA: Diagnosis not present

## 2024-01-24 DIAGNOSIS — E1159 Type 2 diabetes mellitus with other circulatory complications: Secondary | ICD-10-CM

## 2024-01-24 DIAGNOSIS — I152 Hypertension secondary to endocrine disorders: Secondary | ICD-10-CM | POA: Diagnosis not present

## 2024-01-24 DIAGNOSIS — E7800 Pure hypercholesterolemia, unspecified: Secondary | ICD-10-CM | POA: Diagnosis not present

## 2024-01-24 DIAGNOSIS — E785 Hyperlipidemia, unspecified: Secondary | ICD-10-CM | POA: Diagnosis not present

## 2024-01-24 DIAGNOSIS — Z23 Encounter for immunization: Secondary | ICD-10-CM

## 2024-01-24 LAB — BAYER DCA HB A1C WAIVED: HB A1C (BAYER DCA - WAIVED): 5.9 % — ABNORMAL HIGH (ref 4.8–5.6)

## 2024-01-24 MED ORDER — LEVOTHYROXINE SODIUM 100 MCG PO TABS: 100.0000 ug | ORAL_TABLET | Freq: Every day | ORAL | 3 refills | Status: DC

## 2024-01-24 NOTE — Progress Notes (Signed)
BP 135/71   Pulse 62   Ht 5\' 2"  (1.575 m)   Wt 187 lb (84.8 kg)   SpO2 97%   BMI 34.20 kg/m    Subjective:   Patient ID: Danielle Parrish, female    DOB: 1941/02/29, 83 y.o.   MRN: [id number]  HPI: Danielle Parrish is a 83 y.o. female presenting on 01/24/2024 for Medical Management of Chronic Issues, Hypertension, Hyperlipidemia, Hypothyroidism, and Diabetes   HPI Type 2 diabetes mellitus Patient comes in today for recheck of his diabetes. Patient has been currently taking no medicine for diabetes currently, although she is gaining weight recently her A1c is still 5.9.. Patient is currently on an ACE inhibitor/ARB. Patient has not seen an ophthalmologist this year. Patient denies any new issues with their feet. The symptom started onset as an adult hypertension and hyperlipidemia and hypothyroidism ARE RELATED TO DM.   Hypertension Patient is currently on amlodipine and furosemide and metoprolol and olmesartan, and their blood pressure today is 135/71. Patient denies any lightheadedness or dizziness. Patient denies headaches, blurred vision, chest pains, shortness of breath, or weakness. Denies any side effects from medication and is content with current medication.  She sees cardiology and is currently on a Zio patch to help monitor heart rate.  Hyperlipidemia Patient is coming in for recheck of his hyperlipidemia. The patient is currently taking lovastatin. They deny any issues with myalgias or history of liver damage from it. They deny any focal numbness or weakness or chest pain.   Hypothyroidism recheck Patient is coming in for thyroid recheck today as well. They deny any issues with hair changes or heat or cold problems or diarrhea or constipation. They deny any chest pain or palpitations. They are currently on levothyroxine 100 micrograms.  She does complain of general fatigue and just not feeling like she has a lot of energy although she does admit since her daughter passed  away in October she has been mostly sedentary and sits at home does not get up or get out because she cannot drive because her car is broken down.  She is doing group therapy once a week and home therapy twice a week.    Relevant past medical, surgical, family and social history reviewed and updated as indicated. Interim medical history since our last visit reviewed. Allergies and medications reviewed and updated.  Review of Systems  Constitutional:  Positive for fatigue. Negative for chills, fever and unexpected weight change.  HENT:  Negative for congestion, ear discharge and ear pain.   Eyes:  Negative for redness and visual disturbance.  Respiratory:  Positive for shortness of breath (She is requiring her oxygen more often and cannot come off of it as much as she used to.). Negative for chest tightness.   Cardiovascular:  Positive for palpitations and leg swelling. Negative for chest pain.  Genitourinary:  Negative for difficulty urinating and dysuria.  Musculoskeletal:  Negative for back pain and gait problem.  Skin:  Negative for rash.  Neurological:  Negative for dizziness, weakness, light-headedness, numbness and headaches.  Psychiatric/Behavioral:  Negative for agitation and behavioral problems.   All other systems reviewed and are negative.   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    Penicillins Hives, Other (See Comments)   Has patient had a  PCN reaction causing immediate rash, facial/tongue/throat swelling, SOB or lightheadedness with hypotension: Yes Has patient had a PCN reaction causing severe rash involving mucus membranes or skin necrosis: unknown Has patient had a PCN reaction that required hospitalization No Has patient had a PCN reaction occurring within the last 10 years: No If all of the above  answers are "NO", then may proceed with Cephalosporin use.   Rosuvastatin Other (See Comments)   Body aches.   Statins Other (See Comments)   Vytorin [ezetimibe-simvastatin]    Body aches.    Aspirin Nausea Only   Tolmetin Nausea Only   Burns stomach.         Medication List        Accurate as of January 24, 2024  3:06 PM. If you have any questions, ask your nurse or doctor.          STOP taking these medications    cetirizine 10 MG tablet Commonly known as: ZYRTEC Stopped by: Elige Radon Sharene Krikorian   cyclobenzaprine 5 MG tablet Commonly known as: FLEXERIL Stopped by: Elige Radon Cecily Lawhorne       TAKE these medications    acetaminophen 500 MG tablet Commonly known as: TYLENOL Take 1 tablet (500 mg total) by mouth every 6 (six) hours as needed.   albuterol 108 (90 Base) MCG/ACT inhaler Commonly known as: VENTOLIN HFA Take 2 puffs eveery 6 hours as needed for wheezing or shortness of breath   amLODipine 10 MG tablet Commonly known as: NORVASC Take 1 tablet (10 mg total) by mouth daily.   apixaban 5 MG Tabs tablet Commonly known as: Eliquis Take 1 tablet (5 mg total) by mouth 2 (two) times daily.   azelastine 0.1 % nasal spray Commonly known as: ASTELIN Place 1 spray into both nostrils 2 (two) times daily. Use in each nostril as directed   calcium-vitamin D 500-200 MG-UNIT Tabs tablet Commonly known as: OSCAL WITH D TAKE 1 TABLET BY MOUTH EVERY DAY WITH BREAKFAST   diclofenac Sodium 1 % Gel Commonly known as: VOLTAREN APPLY 4 GRAMS TOPICALLY 4 (FOUR) TIMES DAILY.   diphenhydrAMINE 25 MG tablet Commonly known as: BENADRYL Take 50 mg by mouth at bedtime as needed for allergies.   fluconazole 150 MG tablet Commonly known as: Diflucan Take one tablet by mouth now. Repeat in 3 days if symptoms persist.   furosemide 40 MG tablet Commonly known as: LASIX TAKE 1 TABLET BY MOUTH EVERY DAY   gabapentin 300 MG capsule Commonly known as: NEURONTIN Take 1  capsule (300 mg total) by mouth 3 (three) times daily.   Iron 325 (65 Fe) MG Tabs Take 1 tablet by mouth 3 (three) times daily.   ketoconazole 2 % cream Commonly known as: NIZORAL APPLY TO AFFECTED AREA EVERYDAY   levothyroxine 100 MCG tablet Commonly known as: SYNTHROID Take 1 tablet (100 mcg total) by mouth daily before breakfast. What changed: See the new instructions. Changed by: Elige Radon Ardelia Wrede   lidocaine 5 % Commonly known as: LIDODERM PLACE 1 PATCH ONTO THE SKIN DAILY. REMOVE & DISCARD PATCH WITHIN 12 HOURS OR AS DIRECTED BY MD   linaclotide 145 MCG Caps capsule Commonly known as: LINZESS Take 145 mcg by mouth as needed.   lovastatin 40 MG tablet Commonly known as: MEVACOR Take 1 tablet (40 mg total) by mouth at bedtime.   metoprolol succinate 25 MG 24 hr tablet Commonly known as: TOPROL-XL Take 1 tablet (25 mg total) by mouth daily.   mirabegron ER 25  MG Tb24 tablet Commonly known as: Myrbetriq Take 1 tablet (25 mg total) by mouth daily.   nystatin powder Commonly known as: MYCOSTATIN/NYSTOP APPLY TO AFFECTED AREA 4 TIMES A DAY   olmesartan 20 MG tablet Commonly known as: BENICAR Take 1 tablet (20 mg total) by mouth daily.   omeprazole 20 MG capsule Commonly known as: PRILOSEC Take 1 capsule (20 mg total) by mouth 2 (two) times daily before a meal. What changed: when to take this   predniSONE 10 MG tablet Commonly known as: DELTASONE Take 10 mg by mouth 2 (two) times daily with a meal.   PRESERVISION AREDS 2 PO Take by mouth.   pyridOXINE 100 MG tablet Commonly known as: VITAMIN B6 Take 100 mg by mouth daily.   sertraline 100 MG tablet Commonly known as: ZOLOFT Take 2 tablets (200 mg total) by mouth daily.   sodium chloride 0.65 % Soln nasal spray Commonly known as: OCEAN Place 1 spray into both nostrils as needed for congestion.   VITAMIN D-3 PO Take 1 capsule by mouth daily.         Objective:   BP 135/71   Pulse 62   Ht 5'  2" (1.575 m)   Wt 187 lb (84.8 kg)   SpO2 97%   BMI 34.20 kg/m   Wt Readings from Last 3 Encounters:  01/24/24 187 lb (84.8 kg)  01/15/24 185 lb (83.9 kg)  11/01/23 183 lb (83 kg)    Physical Exam Vitals and nursing note reviewed.  Constitutional:      General: She is not in acute distress.    Appearance: She is well-developed. She is not diaphoretic.  Eyes:     Conjunctiva/sclera: Conjunctivae normal.  Cardiovascular:     Rate and Rhythm: Normal rate and regular rhythm.     Heart sounds: Normal heart sounds. No murmur heard. Pulmonary:     Effort: Pulmonary effort is normal. No respiratory distress.     Breath sounds: Wheezing (End expiratory wheezes in upper lobes) present.  Musculoskeletal:        General: Swelling (1+ peripheral) present. No tenderness. Normal range of motion.  Skin:    General: Skin is warm and dry.     Findings: No rash.  Neurological:     Mental Status: She is alert and oriented to person, place, and time.     Coordination: Coordination normal.  Psychiatric:        Behavior: Behavior normal.       Assessment & Plan:   Problem List Items Addressed This Visit       Cardiovascular and Mediastinum   Hypertension associated with type 2 diabetes mellitus (HCC) (Chronic)   Relevant Orders   BMP8+EGFR   CBC with Differential/Platelet   Lipid panel   Bayer DCA Hb A1c Waived   TSH     Endocrine   Hyperlipidemia associated with type 2 diabetes mellitus (HCC) - Primary (Chronic)   Relevant Orders   BMP8+EGFR   CBC with Differential/Platelet   Lipid panel   Bayer DCA Hb A1c Waived   TSH   Type 2 diabetes mellitus with hypercholesterolemia (HCC)   Relevant Orders   Microalbumin / creatinine urine ratio   Hypothyroidism   Relevant Medications   levothyroxine (SYNTHROID) 100 MCG tablet   Other Relevant Orders   BMP8+EGFR   CBC with Differential/Platelet   Lipid panel   Bayer DCA Hb A1c Waived   TSH    A1c looks good at 5.9.  The  rest of  her blood work is pending.  She is seeing cardiology for a heart monitor currently and is wearing the Zio patch today.  She has decreased activity since her daughter passed away and cannot get out of the house because she does not have transportation and encouraged that she try to find a way to get out and have more transportation as much as she can. Follow up plan: Return in about 3 months (around 04/23/2024), or if symptoms worsen or fail to improve, for Diabetes recheck.  Counseling provided for all of the vaccine components Orders Placed This Encounter  Procedures   BMP8+EGFR   CBC with Differential/Platelet   Lipid panel   Bayer DCA Hb A1c Waived   TSH   Microalbumin / creatinine urine ratio    Arville Care, MD [facility] 01/24/2024, 3:06 PM

## 2024-01-25 LAB — LIPID PANEL
Chol/HDL Ratio: 2.5 {ratio} (ref 0.0–4.4)
Cholesterol, Total: 188 mg/dL (ref 100–199)
HDL: 76 mg/dL (ref >39)
LDL Chol Calc (NIH): 83 mg/dL (ref 0–99)
Triglycerides: 172 mg/dL — ABNORMAL HIGH (ref 0–149)
VLDL Cholesterol Cal: 29 mg/dL (ref 5–40)

## 2024-01-25 LAB — CBC WITH DIFFERENTIAL/PLATELET
Basophils Absolute: 0 10*3/uL (ref 0.0–0.2)
Basos: 0 %
EOS (ABSOLUTE): 0 10*3/uL (ref 0.0–0.4)
Eos: 0 %
Hematocrit: 42.9 % (ref 34.0–46.6)
Hemoglobin: 14.3 g/dL (ref 11.1–15.9)
Immature Grans (Abs): 0 10*3/uL (ref 0.0–0.1)
Immature Granulocytes: 1 %
Lymphocytes Absolute: 2.3 10*3/uL (ref 0.7–3.1)
Lymphs: 38 %
MCH: 32.6 pg (ref 26.6–33.0)
MCHC: 33.3 g/dL (ref 31.5–35.7)
MCV: 98 fL — ABNORMAL HIGH (ref 79–97)
Monocytes Absolute: 0.5 10*3/uL (ref 0.1–0.9)
Monocytes: 9 %
Neutrophils Absolute: 3.2 10*3/uL (ref 1.4–7.0)
Neutrophils: 52 %
Platelets: 212 10*3/uL (ref 150–450)
RBC: 4.38 x10E6/uL (ref 3.77–5.28)
RDW: 11.8 % (ref 11.7–15.4)
WBC: 6.1 10*3/uL (ref 3.4–10.8)

## 2024-01-25 LAB — BMP8+EGFR
BUN/Creatinine Ratio: 19 (ref 12–28)
BUN: 13 mg/dL (ref 8–27)
CO2: 26 mmol/L (ref 20–29)
Calcium: 10.6 mg/dL — ABNORMAL HIGH (ref 8.7–10.3)
Chloride: 95 mmol/L — ABNORMAL LOW (ref 96–106)
Creatinine, Ser: 0.68 mg/dL (ref 0.57–1.00)
Glucose: 84 mg/dL (ref 70–99)
Potassium: 4.3 mmol/L (ref 3.5–5.2)
Sodium: 137 mmol/L (ref 134–144)
eGFR: 86 mL/min/{1.73_m2} (ref >59)

## 2024-01-25 LAB — MICROALBUMIN / CREATININE URINE RATIO
Creatinine, Urine: 20.3 mg/dL
Microalb/Creat Ratio: <15 mg/g{creat} (ref 0–29)
Microalbumin, Urine: <3 ug/mL

## 2024-01-25 LAB — TSH: TSH: 1.13 u[IU]/mL (ref 0.450–4.500)

## 2024-01-29 ENCOUNTER — Ambulatory Visit: Payer: 59 | Attending: Orthopedic Surgery

## 2024-01-29 DIAGNOSIS — G8929 Other chronic pain: Secondary | ICD-10-CM | POA: Diagnosis not present

## 2024-01-29 DIAGNOSIS — M25561 Pain in right knee: Secondary | ICD-10-CM | POA: Diagnosis not present

## 2024-01-29 DIAGNOSIS — M25562 Pain in left knee: Secondary | ICD-10-CM | POA: Diagnosis not present

## 2024-01-29 DIAGNOSIS — R2681 Unsteadiness on feet: Secondary | ICD-10-CM | POA: Diagnosis not present

## 2024-01-29 NOTE — Therapy (Signed)
 OUTPATIENT PHYSICAL THERAPY LOWER EXTREMITY TREATMENT   Patient Name: Danielle Parrish MRN: [id number] DOB:1941/05/01, 83 y.o., female Today's Date: 01/29/2024  END OF SESSION:  PT End of Session - 01/29/24 1451     Visit Number 4    Number of Visits 12    Date for PT Re-Evaluation 03/21/24    PT Start Time 1430    Equipment Utilized During Treatment Oxygen     Activity Tolerance Patient tolerated treatment well    Behavior During Therapy WFL for tasks assessed/performed              Past Medical History:  Diagnosis Date   Anxiety    Arthritis    CHF (congestive heart failure) (HCC)    Chronic pain of multiple joints    Depression    Essential hypertension    GERD (gastroesophageal reflux disease)    Hyperlipidemia    Hyperlipidemia associated with type 2 diabetes mellitus (HCC)    Hypertension associated with type 2 diabetes mellitus (HCC)    Hypothyroidism    Mild scoliosis 10/19/2021   Osteoporosis    Sleep apnea    CPAP   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, left 09/05/2023   left wrist   needs excision   Squamous cell carcinoma of skin 09/05/2023   left ear   needs mohs   Type 2 diabetes mellitus with hypercholesterolemia (HCC)    Vitamin D  deficiency    Past Surgical History:  Procedure Laterality Date   ABDOMINAL HERNIA REPAIR     ABDOMINAL HYSTERECTOMY  1977   BREAST EXCISIONAL BIOPSY Right    EYE SURGERY  2020   bilateral cataract removal    KNEE ARTHROSCOPY Left 09/30/2014   Procedure: LEFT KNEE ARTHROSCOPY MEDIAL MENISECTOMY ABRASION CHONDROPLASTY SYNOVECTOMY SUPRAPATELLER CUFF;  Surgeon: Tanda DELENA Heading, MD;  Location: [facility];  Service: Orthopedics;  Laterality: Left;   Patient Active Problem List   Diagnosis Date Noted   Chronic anticoagulation 09/07/2023   Paroxysmal atrial fibrillation (HCC) 06/19/2023   Hypercoagulable state due to paroxysmal atrial fibrillation (HCC) 06/19/2023   Chronic respiratory failure with hypoxia (HCC)  07/10/2022   Chronic midline low back pain without sciatica 01/30/2019   Age-related osteoporosis with current pathol fracture of vertebra (HCC) 10/28/2018   Calcification of aorta (HCC) 10/02/2018   Compression fracture of T12 vertebra (HCC) 10/01/2018   Primary osteoarthritis of right knee 07/20/2017   Varicose veins of left lower extremity with complications 07/31/2016   OSA (obstructive sleep apnea) 09/14/2015   Chronic diastolic heart failure (HCC) 09/10/2015   Dysphagia, pharyngoesophageal phase 08/04/2015   Anorexia 08/04/2015   Malaise and fatigue 08/04/2015   Depression, recurrent (HCC) 04/08/2015   Vitamin D  deficiency    Anxiety    GERD (gastroesophageal reflux disease)    Arthritis    Hypertension associated with type 2 diabetes mellitus (HCC) 05/15/2013   Hyperlipidemia associated with type 2 diabetes mellitus (HCC) 05/15/2013   Type 2 diabetes mellitus with hypercholesterolemia (HCC) 05/15/2013   Morbid obesity (HCC) 05/15/2013   Hypothyroidism 05/15/2013    PCP: Dettinger, Fonda DELENA, MD  REFERRING PROVIDER: Heading Tanda, MD   REFERRING DIAG: Right knee pain, Left knee pain   THERAPY DIAG:  Unsteadiness on feet  Chronic pain of right knee  Chronic pain of left knee  Rationale for Evaluation and Treatment: Rehabilitation  ONSET DATE: years ago   SUBJECTIVE:   SUBJECTIVE STATEMENT: Pt denies any pain today, but reports fatigue from working out earlier today.  PERTINENT HISTORY: Hypertension, diabetes type 2, chronic diastolic heart failure, atrial fibrillation, arthritis, osteoporosis, anxiety, depression, and requires portable oxygen  PAIN:  Are you having pain? Yes: NPRS scale: Current: 0/10 Best: 0/10 Worst: 9/10 Pain location: both knees (Left knee worse than the right)  Pain description: intermittent sharp pain  Aggravating factors: cold weather Relieving factors: medication and injections  PRECAUTIONS: None  RED FLAGS: None   WEIGHT  BEARING RESTRICTIONS: No  FALLS:  Has patient fallen in last 6 months? No  LIVING ENVIRONMENT: Lives with: lives with their family Lives in: House/apartment Stairs: Yes: Internal: 20 steps; on right going up; step to pattern Has following equipment at home: Single point cane (patient only uses the cane when she is away from home)   OCCUPATION: retired  PLOF: Independent  PATIENT GOALS: walk better  NEXT MD VISIT: none scheduled  OBJECTIVE:  Note: Objective measures were completed at Evaluation unless otherwise noted.  COGNITION: Overall cognitive status: Within functional limits for tasks assessed     SENSATION: Patient reports no numbness or tingling  EDEMA:  No significant edema observed  POSTURE: rounded shoulders, forward head, decreased lumbar lordosis, and increased thoracic kyphosis  PALPATION: No tenderness to palpation reported  LOWER EXTREMITY ROM: assessed in sitting  Active ROM Right eval Left eval  Hip flexion    Hip extension    Hip abduction    Hip adduction    Hip internal rotation    Hip external rotation    Knee flexion 117 107  Knee extension 6 11  Ankle dorsiflexion    Ankle plantarflexion    Ankle inversion    Ankle eversion     (Blank rows = not tested)  LOWER EXTREMITY MMT:  MMT Right eval Left eval  Hip flexion 4-/5 4+/5  Hip extension    Hip abduction    Hip adduction    Hip internal rotation    Hip external rotation    Knee flexion 4+/5 4+/5  Knee extension 4+/5 5/5  Ankle dorsiflexion 4/5 4/5  Ankle plantarflexion    Ankle inversion    Ankle eversion     (Blank rows = not tested)  FUNCTIONAL TESTS:  5 times sit to stand: 28.17 seconds with single armrest and SPC  Timed up and go (TUG): 28.32 seconds with SPC and oxygen   GAIT: Assistive device utilized: Single point cane Level of assistance: Modified independence Comments: Decreased gait speed and stride length with poor foot clearance, heel strike, and toe off  bilaterally                                                                                                                                TREATMENT DATE:  01/29/24    EXERCISE LOG  Exercise Repetitions and Resistance Comments  Nustep Lvl 3 x 13 mins   Rockerboard     LAQs 2# x 25 reps bils   Seated Marches 2# x 25 reps  bils   Seated Hip Adduction 3 mins   Seated Hip Abduction Red x 3 mins   Seated Ham Curls Red x 25 reps bil    Blank cell = exercise not performed today                                    01/23/24 EXERCISE LOG  Exercise Repetitions and Resistance Comments  Nustep  L4 x 17 minutes   LAQ 3# x 3 minutes    Marching on foam  3 minutes  BUE support  Step up  6 step x 10 reps each  With BUE support  Seated marching 3# x 2.5 minutes    Blank cell = exercise not performed today                  01/21/24                   EXERCISE LOG  Exercise Repetitions and Resistance Comments  Nustep Lvl 1 x 10 mins   Rockerboard  (Seated) 3 mins   LAQs 2# x 20 reps bils   Seated Marches 2# x 15 reps bils   Seated Hip Adduction 3 mins   Seated Hip Abduction Red x 3 mins   Seated Ham Curls Red x 20 reps bil    Blank cell = exercise not performed today   PATIENT EDUCATION:  Education details: progress with physical therapy and benefits of exercise Person educated: Patient Education method: Explanation Education comprehension: verbalized understanding  HOME EXERCISE PROGRAM:   ASSESSMENT:  CLINICAL IMPRESSION: Pt arrives for today's treatment session denying any pain, but reports fatigue from working out earlier today.   Pt able to tolerate increased reps with numerous seated exercises today. Pt given seated rest breaks as needed due to fatigue.  Pt denied any pain at completion of today's treatment session.  OBJECTIVE IMPAIRMENTS: Abnormal gait, decreased activity tolerance, decreased balance, decreased mobility, difficulty walking, decreased ROM, decreased strength, and  pain.   ACTIVITY LIMITATIONS: carrying, lifting, squatting, stairs, transfers, and locomotion level  PARTICIPATION LIMITATIONS: meal prep, cleaning, laundry, shopping, and community activity  PERSONAL FACTORS: Age, Past/current experiences, Time since onset of injury/illness/exacerbation, Transportation, and 3+ comorbidities: Hypertension, diabetes type 2, chronic diastolic heart failure, atrial fibrillation, arthritis, osteoporosis, anxiety, depression, and requires portable oxygen   are also affecting patient's functional outcome.   REHAB POTENTIAL: Fair    CLINICAL DECISION MAKING: Evolving/moderate complexity  EVALUATION COMPLEXITY: Moderate   GOALS: Goals reviewed with patient? Yes  SHORT TERM GOALS: Target date: 02/06/24 Patient will be independent with her initial HEP. Baseline: Goal status: INITIAL  2.  Patient will improve her 5 times sit to stand time to 21 seconds or less for improved lower extremity power. Baseline:  Goal status: INITIAL  3.  Patient will improve her timed up and go time to 21 seconds or less for improved functional mobility. Baseline:  Goal status: INITIAL  LONG TERM GOALS: Target date: 02/27/24  Patient will be independent with her advanced HEP. Baseline:  Goal status: INITIAL  2.  Patient will be able to transfer with minimal to no upper extremity support for improved independence. Baseline:  Goal status: INITIAL  3.  Patient will improve her timed up and go time to 15 seconds or less for improved safety and reduced fall risk. Baseline:  Goal status: INITIAL  4.  Patient will improve her 5 times  sit to stand time to 15 seconds or less to reduce her fall risk. Baseline:  Goal status: INITIAL  PLAN:  PT FREQUENCY: 2x/week  PT DURATION: 6 weeks  PLANNED INTERVENTIONS: 97164- PT Re-evaluation, 97110-Therapeutic exercises, 97530- Therapeutic activity, 97112- Neuromuscular re-education, 97535- Self Care, [id number]- Manual therapy, [phone]- Gait  training, Patient/Family education, Balance training, Stair training, Joint mobilization, Cryotherapy, and Moist heat  PLAN FOR NEXT SESSION: Nustep, lower extremity strengthening, gait training, and manual therapy as needed   Delon DELENA Gosling, PTA 01/29/2024, 4:51 PM

## 2024-01-30 ENCOUNTER — Ambulatory Visit: Payer: 59

## 2024-01-30 DIAGNOSIS — R2681 Unsteadiness on feet: Secondary | ICD-10-CM

## 2024-01-30 DIAGNOSIS — M25562 Pain in left knee: Secondary | ICD-10-CM | POA: Diagnosis not present

## 2024-01-30 DIAGNOSIS — G8929 Other chronic pain: Secondary | ICD-10-CM | POA: Diagnosis not present

## 2024-01-30 DIAGNOSIS — M25561 Pain in right knee: Secondary | ICD-10-CM | POA: Diagnosis not present

## 2024-01-30 NOTE — Therapy (Signed)
 OUTPATIENT PHYSICAL THERAPY LOWER EXTREMITY TREATMENT   Patient Name: Danielle Parrish MRN: [id number] DOB:07-27-1941, 83 y.o., female Today's Date: 01/30/2024  END OF SESSION:  PT End of Session - 01/30/24 1351     Visit Number 5    Number of Visits 12    Date for PT Re-Evaluation 03/21/24    PT Start Time 1345    PT Stop Time 1432    PT Time Calculation (min) 47 min    Equipment Utilized During Treatment Oxygen     Activity Tolerance Patient tolerated treatment well    Behavior During Therapy WFL for tasks assessed/performed              Past Medical History:  Diagnosis Date   Anxiety    Arthritis    CHF (congestive heart failure) (HCC)    Chronic pain of multiple joints    Depression    Essential hypertension    GERD (gastroesophageal reflux disease)    Hyperlipidemia    Hyperlipidemia associated with type 2 diabetes mellitus (HCC)    Hypertension associated with type 2 diabetes mellitus (HCC)    Hypothyroidism    Mild scoliosis 10/19/2021   Osteoporosis    Sleep apnea    CPAP   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, left 09/05/2023   left wrist   needs excision   Squamous cell carcinoma of skin 09/05/2023   left ear   needs mohs   Type 2 diabetes mellitus with hypercholesterolemia (HCC)    Vitamin D  deficiency    Past Surgical History:  Procedure Laterality Date   ABDOMINAL HERNIA REPAIR     ABDOMINAL HYSTERECTOMY  1977   BREAST EXCISIONAL BIOPSY Right    EYE SURGERY  2020   bilateral cataract removal    KNEE ARTHROSCOPY Left 09/30/2014   Procedure: LEFT KNEE ARTHROSCOPY MEDIAL MENISECTOMY ABRASION CHONDROPLASTY SYNOVECTOMY SUPRAPATELLER CUFF;  Surgeon: Tanda DELENA Heading, MD;  Location: [facility];  Service: Orthopedics;  Laterality: Left;   Patient Active Problem List   Diagnosis Date Noted   Chronic anticoagulation 09/07/2023   Paroxysmal atrial fibrillation (HCC) 06/19/2023   Hypercoagulable state due to paroxysmal atrial fibrillation (HCC) 06/19/2023    Chronic respiratory failure with hypoxia (HCC) 07/10/2022   Chronic midline low back pain without sciatica 01/30/2019   Age-related osteoporosis with current pathol fracture of vertebra (HCC) 10/28/2018   Calcification of aorta (HCC) 10/02/2018   Compression fracture of T12 vertebra (HCC) 10/01/2018   Primary osteoarthritis of right knee 07/20/2017   Varicose veins of left lower extremity with complications 07/31/2016   OSA (obstructive sleep apnea) 09/14/2015   Chronic diastolic heart failure (HCC) 09/10/2015   Dysphagia, pharyngoesophageal phase 08/04/2015   Anorexia 08/04/2015   Malaise and fatigue 08/04/2015   Depression, recurrent (HCC) 04/08/2015   Vitamin D  deficiency    Anxiety    GERD (gastroesophageal reflux disease)    Arthritis    Hypertension associated with type 2 diabetes mellitus (HCC) 05/15/2013   Hyperlipidemia associated with type 2 diabetes mellitus (HCC) 05/15/2013   Type 2 diabetes mellitus with hypercholesterolemia (HCC) 05/15/2013   Morbid obesity (HCC) 05/15/2013   Hypothyroidism 05/15/2013    PCP: Dettinger, Fonda DELENA, MD  REFERRING PROVIDER: Heading Tanda, MD   REFERRING DIAG: Right knee pain, Left knee pain   THERAPY DIAG:  Unsteadiness on feet  Chronic pain of right knee  Chronic pain of left knee  Rationale for Evaluation and Treatment: Rehabilitation  ONSET DATE: years ago   SUBJECTIVE:  SUBJECTIVE STATEMENT: Pt denies any pain today, but reports feeling more tired that usual.  PERTINENT HISTORY: Hypertension, diabetes type 2, chronic diastolic heart failure, atrial fibrillation, arthritis, osteoporosis, anxiety, depression, and requires portable oxygen  PAIN:  Are you having pain? Yes: NPRS scale: Current: 0/10 Best: 0/10 Worst: 9/10 Pain location: both knees (Left knee worse than the right)  Pain description: intermittent sharp pain  Aggravating factors: cold weather Relieving factors: medication and  injections  PRECAUTIONS: None  RED FLAGS: None   WEIGHT BEARING RESTRICTIONS: No  FALLS:  Has patient fallen in last 6 months? No  LIVING ENVIRONMENT: Lives with: lives with their family Lives in: House/apartment Stairs: Yes: Internal: 20 steps; on right going up; step to pattern Has following equipment at home: Single point cane (patient only uses the cane when she is away from home)   OCCUPATION: retired  PLOF: Independent  PATIENT GOALS: walk better  NEXT MD VISIT: none scheduled  OBJECTIVE:  Note: Objective measures were completed at Evaluation unless otherwise noted.  COGNITION: Overall cognitive status: Within functional limits for tasks assessed     SENSATION: Patient reports no numbness or tingling  EDEMA:  No significant edema observed  POSTURE: rounded shoulders, forward head, decreased lumbar lordosis, and increased thoracic kyphosis  PALPATION: No tenderness to palpation reported  LOWER EXTREMITY ROM: assessed in sitting  Active ROM Right eval Left eval  Hip flexion    Hip extension    Hip abduction    Hip adduction    Hip internal rotation    Hip external rotation    Knee flexion 117 107  Knee extension 6 11  Ankle dorsiflexion    Ankle plantarflexion    Ankle inversion    Ankle eversion     (Blank rows = not tested)  LOWER EXTREMITY MMT:  MMT Right eval Left eval  Hip flexion 4-/5 4+/5  Hip extension    Hip abduction    Hip adduction    Hip internal rotation    Hip external rotation    Knee flexion 4+/5 4+/5  Knee extension 4+/5 5/5  Ankle dorsiflexion 4/5 4/5  Ankle plantarflexion    Ankle inversion    Ankle eversion     (Blank rows = not tested)  FUNCTIONAL TESTS:  5 times sit to stand: 28.17 seconds with single armrest and SPC  Timed up and go (TUG): 28.32 seconds with SPC and oxygen   GAIT: Assistive device utilized: Single point cane Level of assistance: Modified independence Comments: Decreased gait speed and  stride length with poor foot clearance, heel strike, and toe off bilaterally                                                                                                                                TREATMENT DATE:  01/29/24    EXERCISE LOG  Exercise Repetitions and Resistance Comments  Nustep Lvl 3 x 15 mins   Rockerboard  LAQs 2# x 25 reps bil   Seated Marches 2# x 25 reps bil   Seated Hip Adduction 3.5 mins   Seated Hip Abduction Red x 3.5 mins   Seated Ham Curls Red x 25 reps bil    Blank cell = exercise not performed today                                    01/23/24 EXERCISE LOG  Exercise Repetitions and Resistance Comments  Nustep  L4 x 17 minutes   LAQ 3# x 3 minutes    Marching on foam  3 minutes  BUE support  Step up  6 step x 10 reps each  With BUE support  Seated marching 3# x 2.5 minutes    Blank cell = exercise not performed today                  01/21/24                   EXERCISE LOG  Exercise Repetitions and Resistance Comments  Nustep Lvl 1 x 10 mins   Rockerboard  (Seated) 3 mins   LAQs 2# x 20 reps bils   Seated Marches 2# x 15 reps bils   Seated Hip Adduction 3 mins   Seated Hip Abduction Red x 3 mins   Seated Ham Curls Red x 20 reps bil    Blank cell = exercise not performed today   PATIENT EDUCATION:  Education details: progress with physical therapy and benefits of exercise Person educated: Patient Education method: Explanation Education comprehension: verbalized understanding  HOME EXERCISE PROGRAM:   ASSESSMENT:  CLINICAL IMPRESSION: Pt arrives for today's treatment session denying any pain, but reports feeling very fatigued today.  Pt requiring increased rest breaks today during Nustep due to fatigue and SHOB.  Pt's SpO2 > 91% throughout treatment on normal O2 levels.  Pt able to tolerate increased time with seated hip abduction and adduction today with minimal fatigue.  Pt denied any pain at completion of today's treatment session.     OBJECTIVE IMPAIRMENTS: Abnormal gait, decreased activity tolerance, decreased balance, decreased mobility, difficulty walking, decreased ROM, decreased strength, and pain.   ACTIVITY LIMITATIONS: carrying, lifting, squatting, stairs, transfers, and locomotion level  PARTICIPATION LIMITATIONS: meal prep, cleaning, laundry, shopping, and community activity  PERSONAL FACTORS: Age, Past/current experiences, Time since onset of injury/illness/exacerbation, Transportation, and 3+ comorbidities: Hypertension, diabetes type 2, chronic diastolic heart failure, atrial fibrillation, arthritis, osteoporosis, anxiety, depression, and requires portable oxygen   are also affecting patient's functional outcome.   REHAB POTENTIAL: Fair    CLINICAL DECISION MAKING: Evolving/moderate complexity  EVALUATION COMPLEXITY: Moderate   GOALS: Goals reviewed with patient? Yes  SHORT TERM GOALS: Target date: 02/06/24 Patient will be independent with her initial HEP. Baseline: Goal status: INITIAL  2.  Patient will improve her 5 times sit to stand time to 21 seconds or less for improved lower extremity power. Baseline:  Goal status: INITIAL  3.  Patient will improve her timed up and go time to 21 seconds or less for improved functional mobility. Baseline:  Goal status: INITIAL  LONG TERM GOALS: Target date: 02/27/24  Patient will be independent with her advanced HEP. Baseline:  Goal status: INITIAL  2.  Patient will be able to transfer with minimal to no upper extremity support for improved independence. Baseline:  Goal status: INITIAL  3.  Patient will  improve her timed up and go time to 15 seconds or less for improved safety and reduced fall risk. Baseline:  Goal status: INITIAL  4.  Patient will improve her 5 times sit to stand time to 15 seconds or less to reduce her fall risk. Baseline:  Goal status: INITIAL  PLAN:  PT FREQUENCY: 2x/week  PT DURATION: 6 weeks  PLANNED INTERVENTIONS:  97164- PT Re-evaluation, 97110-Therapeutic exercises, 97530- Therapeutic activity, 97112- Neuromuscular re-education, 97535- Self Care, [id number]- Manual therapy, [phone]- Gait training, Patient/Family education, Balance training, Stair training, Joint mobilization, Cryotherapy, and Moist heat  PLAN FOR NEXT SESSION: Nustep, lower extremity strengthening, gait training, and manual therapy as needed   Delon DELENA Gosling, PTA 01/30/2024, 3:30 PM

## 2024-02-04 ENCOUNTER — Ambulatory Visit: Payer: 59

## 2024-02-04 DIAGNOSIS — R2681 Unsteadiness on feet: Secondary | ICD-10-CM | POA: Diagnosis not present

## 2024-02-04 DIAGNOSIS — M25562 Pain in left knee: Secondary | ICD-10-CM | POA: Diagnosis not present

## 2024-02-04 DIAGNOSIS — G8929 Other chronic pain: Secondary | ICD-10-CM | POA: Diagnosis not present

## 2024-02-04 DIAGNOSIS — M25561 Pain in right knee: Secondary | ICD-10-CM | POA: Diagnosis not present

## 2024-02-04 NOTE — Therapy (Signed)
 OUTPATIENT PHYSICAL THERAPY LOWER EXTREMITY TREATMENT   Patient Name: Danielle Parrish MRN: [id number] DOB:10/27/1941, 83 y.o., female Today's Date: 02/04/2024  END OF SESSION:  PT End of Session - 02/04/24 1435     Visit Number 6    Number of Visits 12    Date for PT Re-Evaluation 03/21/24    PT Start Time 1430    PT Stop Time 1515    PT Time Calculation (min) 45 min    Equipment Utilized During Treatment Oxygen     Activity Tolerance Patient tolerated treatment well    Behavior During Therapy WFL for tasks assessed/performed              Past Medical History:  Diagnosis Date   Anxiety    Arthritis    CHF (congestive heart failure) (HCC)    Chronic pain of multiple joints    Depression    Essential hypertension    GERD (gastroesophageal reflux disease)    Hyperlipidemia    Hyperlipidemia associated with type 2 diabetes mellitus (HCC)    Hypertension associated with type 2 diabetes mellitus (HCC)    Hypothyroidism    Mild scoliosis 10/19/2021   Osteoporosis    Sleep apnea    CPAP   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, left 09/05/2023   left wrist   needs excision   Squamous cell carcinoma of skin 09/05/2023   left ear   needs mohs   Type 2 diabetes mellitus with hypercholesterolemia (HCC)    Vitamin D  deficiency    Past Surgical History:  Procedure Laterality Date   ABDOMINAL HERNIA REPAIR     ABDOMINAL HYSTERECTOMY  1977   BREAST EXCISIONAL BIOPSY Right    EYE SURGERY  2020   bilateral cataract removal    KNEE ARTHROSCOPY Left 09/30/2014   Procedure: LEFT KNEE ARTHROSCOPY MEDIAL MENISECTOMY ABRASION CHONDROPLASTY SYNOVECTOMY SUPRAPATELLER CUFF;  Surgeon: Florencia Hunter, MD;  Location: [facility];  Service: Orthopedics;  Laterality: Left;   Patient Active Problem List   Diagnosis Date Noted   Chronic anticoagulation 09/07/2023   Paroxysmal atrial fibrillation (HCC) 06/19/2023   Hypercoagulable state due to paroxysmal atrial fibrillation (HCC) 06/19/2023    Chronic respiratory failure with hypoxia (HCC) 07/10/2022   Chronic midline low back pain without sciatica 01/30/2019   Age-related osteoporosis with current pathol fracture of vertebra (HCC) 10/28/2018   Calcification of aorta (HCC) 10/02/2018   Compression fracture of T12 vertebra (HCC) 10/01/2018   Primary osteoarthritis of right knee 07/20/2017   Varicose veins of left lower extremity with complications 07/31/2016   OSA (obstructive sleep apnea) 09/14/2015   Chronic diastolic heart failure (HCC) 09/10/2015   Dysphagia, pharyngoesophageal phase 08/04/2015   Anorexia 08/04/2015   Malaise and fatigue 08/04/2015   Depression, recurrent (HCC) 04/08/2015   Vitamin D  deficiency    Anxiety    GERD (gastroesophageal reflux disease)    Arthritis    Hypertension associated with type 2 diabetes mellitus (HCC) 05/15/2013   Hyperlipidemia associated with type 2 diabetes mellitus (HCC) 05/15/2013   Type 2 diabetes mellitus with hypercholesterolemia (HCC) 05/15/2013   Morbid obesity (HCC) 05/15/2013   Hypothyroidism 05/15/2013    PCP: Dettinger, Lucio Sabin, MD  REFERRING PROVIDER: Hazle Lites, MD   REFERRING DIAG: Right knee pain, Left knee pain   THERAPY DIAG:  Unsteadiness on feet  Chronic pain of right knee  Chronic pain of left knee  Rationale for Evaluation and Treatment: Rehabilitation  ONSET DATE: "years ago"   SUBJECTIVE:  SUBJECTIVE STATEMENT: Pt denies any pain today, but reports feeling tired today.   PERTINENT HISTORY: Hypertension, diabetes type 2, chronic diastolic heart failure, atrial fibrillation, arthritis, osteoporosis, anxiety, depression, and requires portable oxygen  PAIN:  Are you having pain? Yes: NPRS scale: Current: 0/10 Best: 0/10 Worst: 9/10 Pain location: both knees (Left knee worse than the right)  Pain description: intermittent sharp pain  Aggravating factors: cold weather Relieving factors: medication and injections  PRECAUTIONS:  None  RED FLAGS: None   WEIGHT BEARING RESTRICTIONS: No  FALLS:  Has patient fallen in last 6 months? No  LIVING ENVIRONMENT: Lives with: lives with their family Lives in: House/apartment Stairs: Yes: Internal: 20 steps; on right going up; step to pattern Has following equipment at home: Single point cane (patient only uses the cane when she is away from home)   OCCUPATION: retired  PLOF: Independent  PATIENT GOALS: walk better  NEXT MD VISIT: none scheduled  OBJECTIVE:  Note: Objective measures were completed at Evaluation unless otherwise noted.  COGNITION: Overall cognitive status: Within functional limits for tasks assessed     SENSATION: Patient reports no numbness or tingling  EDEMA:  No significant edema observed  POSTURE: rounded shoulders, forward head, decreased lumbar lordosis, and increased thoracic kyphosis  PALPATION: No tenderness to palpation reported  LOWER EXTREMITY ROM: assessed in sitting  Active ROM Right eval Left eval  Hip flexion    Hip extension    Hip abduction    Hip adduction    Hip internal rotation    Hip external rotation    Knee flexion 117 107  Knee extension 6 11  Ankle dorsiflexion    Ankle plantarflexion    Ankle inversion    Ankle eversion     (Blank rows = not tested)  LOWER EXTREMITY MMT:  MMT Right eval Left eval  Hip flexion 4-/5 4+/5  Hip extension    Hip abduction    Hip adduction    Hip internal rotation    Hip external rotation    Knee flexion 4+/5 4+/5  Knee extension 4+/5 5/5  Ankle dorsiflexion 4/5 4/5  Ankle plantarflexion    Ankle inversion    Ankle eversion     (Blank rows = not tested)  FUNCTIONAL TESTS:  5 times sit to stand: 28.17 seconds with single armrest and SPC  Timed up and go (TUG): 28.32 seconds with SPC and oxygen   GAIT: Assistive device utilized: Single point cane Level of assistance: Modified independence Comments: Decreased gait speed and stride length with poor foot  clearance, heel strike, and toe off bilaterally                                                                                                                                TREATMENT DATE:  02/04/24    EXERCISE LOG  Exercise Repetitions and Resistance Comments  Nustep Lvl 3 x 15 mins   Rockerboard  LAQs 3# x 20 reps bil   Seated Marches 3# x 20 reps bil   Seated Hip Adduction 3.5 mins   Seated Hip Abduction Green x 2.5 mins   Seated Ham Curls Green x 20 reps bil    Blank cell = exercise not performed today                                    01/23/24 EXERCISE LOG  Exercise Repetitions and Resistance Comments  Nustep  L4 x 17 minutes   LAQ 3# x 3 minutes    Marching on foam  3 minutes  BUE support  Step up  6" step x 10 reps each  With BUE support  Seated marching 3# x 2.5 minutes    Blank cell = exercise not performed today                  01/21/24                   EXERCISE LOG  Exercise Repetitions and Resistance Comments  Nustep Lvl 1 x 10 mins   Rockerboard  (Seated) 3 mins   LAQs 2# x 20 reps bils   Seated Marches 2# x 15 reps bils   Seated Hip Adduction 3 mins   Seated Hip Abduction Red x 3 mins   Seated Ham Curls Red x 20 reps bil    Blank cell = exercise not performed today   PATIENT EDUCATION:  Education details: progress with physical therapy and benefits of exercise Person educated: Patient Education method: Explanation Education comprehension: verbalized understanding  HOME EXERCISE PROGRAM:   ASSESSMENT:  CLINICAL IMPRESSION: Pt arrives for today's treatment session denying any pain, but reports feeling tired today.  Pt able to tolerate increased weight with seated LAQs and seated marches as well as increased resistance with seated hip abduction and seated ham curls without any increased pain or fatigue.  Pt denied any pain at completion of today's treatment session.  OBJECTIVE IMPAIRMENTS: Abnormal gait, decreased activity tolerance, decreased  balance, decreased mobility, difficulty walking, decreased ROM, decreased strength, and pain.   ACTIVITY LIMITATIONS: carrying, lifting, squatting, stairs, transfers, and locomotion level  PARTICIPATION LIMITATIONS: meal prep, cleaning, laundry, shopping, and community activity  PERSONAL FACTORS: Age, Past/current experiences, Time since onset of injury/illness/exacerbation, Transportation, and 3+ comorbidities: Hypertension, diabetes type 2, chronic diastolic heart failure, atrial fibrillation, arthritis, osteoporosis, anxiety, depression, and requires portable oxygen   are also affecting patient's functional outcome.   REHAB POTENTIAL: Fair    CLINICAL DECISION MAKING: Evolving/moderate complexity  EVALUATION COMPLEXITY: Moderate   GOALS: Goals reviewed with patient? Yes  SHORT TERM GOALS: Target date: 02/06/24 Patient will be independent with her initial HEP. Baseline: Goal status: MET  2.  Patient will improve her 5 times sit to stand time to 21 seconds or less for improved lower extremity power. Baseline: 2/10: 24.01 seconds Goal status: IN PROGRESS  3.  Patient will improve her timed up and go time to 21 seconds or less for improved functional mobility. Baseline: 2/10: 27.13 Goal status: IN PROGRESS  LONG TERM GOALS: Target date: 02/27/24  Patient will be independent with her advanced HEP. Baseline:  Goal status: IN PROGRESS  2.  Patient will be able to transfer with minimal to no upper extremity support for improved independence. Baseline:  Goal status: IN PROGRESS  3.  Patient will improve her timed up and go  time to 15 seconds or less for improved safety and reduced fall risk. Baseline:  Goal status: IN PROGRESS  4.  Patient will improve her 5 times sit to stand time to 15 seconds or less to reduce her fall risk. Baseline:  Goal status: IN PROGRESS  PLAN:  PT FREQUENCY: 2x/week  PT DURATION: 6 weeks  PLANNED INTERVENTIONS: 97164- PT Re-evaluation,  97110-Therapeutic exercises, 97530- Therapeutic activity, 97112- Neuromuscular re-education, 97535- Self Care, [id number]- Manual therapy, [phone]- Gait training, Patient/Family education, Balance training, Stair training, Joint mobilization, Cryotherapy, and Moist heat  PLAN FOR NEXT SESSION: Nustep, lower extremity strengthening, gait training, and manual therapy as needed   Deryl Flora, PTA 02/04/2024, 3:40 PM

## 2024-02-05 DIAGNOSIS — I482 Chronic atrial fibrillation, unspecified: Secondary | ICD-10-CM | POA: Diagnosis not present

## 2024-02-06 ENCOUNTER — Ambulatory Visit: Payer: 59

## 2024-02-06 DIAGNOSIS — G8929 Other chronic pain: Secondary | ICD-10-CM

## 2024-02-06 DIAGNOSIS — M25561 Pain in right knee: Secondary | ICD-10-CM | POA: Diagnosis not present

## 2024-02-06 DIAGNOSIS — R2681 Unsteadiness on feet: Secondary | ICD-10-CM

## 2024-02-06 DIAGNOSIS — M25562 Pain in left knee: Secondary | ICD-10-CM | POA: Diagnosis not present

## 2024-02-06 NOTE — Therapy (Signed)
OUTPATIENT PHYSICAL THERAPY LOWER EXTREMITY TREATMENT   Patient Name: Danielle Parrish MRN: [id number] DOB:12-09-1941, 83 y.o., female Today's Date: 02/06/2024  END OF SESSION:  PT End of Session - 02/06/24 1428     Visit Number 7    Number of Visits 12    Date for PT Re-Evaluation 03/21/24    PT Start Time 1420    PT Stop Time 1515    PT Time Calculation (min) 55 min    Equipment Utilized During Treatment Oxygen    Activity Tolerance Patient tolerated treatment well    Behavior During Therapy WFL for tasks assessed/performed              Past Medical History:  Diagnosis Date   Anxiety    Arthritis    CHF (congestive heart failure) (HCC)    Chronic pain of multiple joints    Depression    Essential hypertension    GERD (gastroesophageal reflux disease)    Hyperlipidemia    Hyperlipidemia associated with type 2 diabetes mellitus (HCC)    Hypertension associated with type 2 diabetes mellitus (HCC)    Hypothyroidism    Mild scoliosis 10/19/2021   Osteoporosis    Sleep apnea    CPAP   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, left 09/05/2023   left wrist   needs excision   Squamous cell carcinoma of skin 09/05/2023   left ear   needs mohs   Type 2 diabetes mellitus with hypercholesterolemia (HCC)    Vitamin D deficiency    Past Surgical History:  Procedure Laterality Date   ABDOMINAL HERNIA REPAIR     ABDOMINAL HYSTERECTOMY  1977   BREAST EXCISIONAL BIOPSY Right    EYE SURGERY  2020   bilateral cataract removal    KNEE ARTHROSCOPY Left 09/30/2014   Procedure: LEFT KNEE ARTHROSCOPY MEDIAL MENISECTOMY ABRASION CHONDROPLASTY SYNOVECTOMY SUPRAPATELLER CUFF;  Surgeon: Jacki Cones, MD;  Location: [facility];  Service: Orthopedics;  Laterality: Left;   Patient Active Problem List   Diagnosis Date Noted   Chronic anticoagulation 09/07/2023   Paroxysmal atrial fibrillation (HCC) 06/19/2023   Hypercoagulable state due to paroxysmal atrial fibrillation (HCC) 06/19/2023    Chronic respiratory failure with hypoxia (HCC) 07/10/2022   Chronic midline low back pain without sciatica 01/30/2019   Age-related osteoporosis with current pathol fracture of vertebra (HCC) 10/28/2018   Calcification of aorta (HCC) 10/02/2018   Compression fracture of T12 vertebra (HCC) 10/01/2018   Primary osteoarthritis of right knee 07/20/2017   Varicose veins of left lower extremity with complications 07/31/2016   OSA (obstructive sleep apnea) 09/14/2015   Chronic diastolic heart failure (HCC) 09/10/2015   Dysphagia, pharyngoesophageal phase 08/04/2015   Anorexia 08/04/2015   Malaise and fatigue 08/04/2015   Depression, recurrent (HCC) 04/08/2015   Vitamin D deficiency    Anxiety    GERD (gastroesophageal reflux disease)    Arthritis    Hypertension associated with type 2 diabetes mellitus (HCC) 05/15/2013   Hyperlipidemia associated with type 2 diabetes mellitus (HCC) 05/15/2013   Type 2 diabetes mellitus with hypercholesterolemia (HCC) 05/15/2013   Morbid obesity (HCC) 05/15/2013   Hypothyroidism 05/15/2013    PCP: Dettinger, Elige Radon, MD  REFERRING PROVIDER: Ranee Gosselin, MD   REFERRING DIAG: Right knee pain, Left knee pain   THERAPY DIAG:  Unsteadiness on feet  Chronic pain of right knee  Chronic pain of left knee  Rationale for Evaluation and Treatment: Rehabilitation  ONSET DATE: "years ago"   SUBJECTIVE:  SUBJECTIVE STATEMENT: Pt denies any pain today and reports feeling better.   PERTINENT HISTORY: Hypertension, diabetes type 2, chronic diastolic heart failure, atrial fibrillation, arthritis, osteoporosis, anxiety, depression, and requires portable oxygen PAIN:  Are you having pain? Yes: NPRS scale: Current: 0/10 Best: 0/10 Worst: 9/10 Pain location: both knees (Left knee worse than the right)  Pain description: intermittent sharp pain  Aggravating factors: cold weather Relieving factors: medication and injections  PRECAUTIONS: None  RED  FLAGS: None   WEIGHT BEARING RESTRICTIONS: No  FALLS:  Has patient fallen in last 6 months? No  LIVING ENVIRONMENT: Lives with: lives with their family Lives in: House/apartment Stairs: Yes: Internal: 20 steps; on right going up; step to pattern Has following equipment at home: Single point cane (patient only uses the cane when she is away from home)   OCCUPATION: retired  PLOF: Independent  PATIENT GOALS: walk better  NEXT MD VISIT: none scheduled  OBJECTIVE:  Note: Objective measures were completed at Evaluation unless otherwise noted.  COGNITION: Overall cognitive status: Within functional limits for tasks assessed     SENSATION: Patient reports no numbness or tingling  EDEMA:  No significant edema observed  POSTURE: rounded shoulders, forward head, decreased lumbar lordosis, and increased thoracic kyphosis  PALPATION: No tenderness to palpation reported  LOWER EXTREMITY ROM: assessed in sitting  Active ROM Right eval Left eval  Hip flexion    Hip extension    Hip abduction    Hip adduction    Hip internal rotation    Hip external rotation    Knee flexion 117 107  Knee extension 6 11  Ankle dorsiflexion    Ankle plantarflexion    Ankle inversion    Ankle eversion     (Blank rows = not tested)  LOWER EXTREMITY MMT:  MMT Right eval Left eval  Hip flexion 4-/5 4+/5  Hip extension    Hip abduction    Hip adduction    Hip internal rotation    Hip external rotation    Knee flexion 4+/5 4+/5  Knee extension 4+/5 5/5  Ankle dorsiflexion 4/5 4/5  Ankle plantarflexion    Ankle inversion    Ankle eversion     (Blank rows = not tested)  FUNCTIONAL TESTS:  5 times sit to stand: 28.17 seconds with single armrest and SPC  Timed up and go (TUG): 28.32 seconds with SPC and oxygen  GAIT: Assistive device utilized: Single point cane Level of assistance: Modified independence Comments: Decreased gait speed and stride length with poor foot clearance,  heel strike, and toe off bilaterally                                                                                                                                TREATMENT DATE:  02/06/24    EXERCISE LOG  Exercise Repetitions and Resistance Comments  Nustep Lvl 3 x 15 mins   Rockerboard  5 mins  LAQs 3# x 25 reps bil   Seated Marches 3# x 25 reps bil   Seated Hip Adduction 3 mins   Seated Hip Abduction Green x 4 mins   Seated Ham Curls Green x 25 reps bil   STS X 10 reps   Rows Yellow x 20 reps   Extensions Yellow x 20 reps    Blank cell = exercise not performed today                                    01/23/24 EXERCISE LOG  Exercise Repetitions and Resistance Comments  Nustep  L4 x 17 minutes   LAQ 3# x 3 minutes    Marching on foam  3 minutes  BUE support  Step up  6" step x 10 reps each  With BUE support  Seated marching 3# x 2.5 minutes    Blank cell = exercise not performed today                  01/21/24                   EXERCISE LOG  Exercise Repetitions and Resistance Comments  Nustep Lvl 1 x 10 mins   Rockerboard  (Seated) 3 mins   LAQs 2# x 20 reps bils   Seated Marches 2# x 15 reps bils   Seated Hip Adduction 3 mins   Seated Hip Abduction Red x 3 mins   Seated Ham Curls Red x 20 reps bil    Blank cell = exercise not performed today   PATIENT EDUCATION:  Education details: progress with physical therapy and benefits of exercise Person educated: Patient Education method: Explanation Education comprehension: verbalized understanding  HOME EXERCISE PROGRAM:   ASSESSMENT:  CLINICAL IMPRESSION: Pt arrives for today's treatment session denying any pain and reports feeling better today.  Pt able to tolerate increased reps with previously performed exercises with minimal fatigue.  Pt able to perform 10 STS transfers consecutively with min cues required for eccentric control with sitting.  Pt introduced to seated rows and extensions today for posture with good  results.  Pt denied any pain at completion of today's treatment session.   OBJECTIVE IMPAIRMENTS: Abnormal gait, decreased activity tolerance, decreased balance, decreased mobility, difficulty walking, decreased ROM, decreased strength, and pain.   ACTIVITY LIMITATIONS: carrying, lifting, squatting, stairs, transfers, and locomotion level  PARTICIPATION LIMITATIONS: meal prep, cleaning, laundry, shopping, and community activity  PERSONAL FACTORS: Age, Past/current experiences, Time since onset of injury/illness/exacerbation, Transportation, and 3+ comorbidities: Hypertension, diabetes type 2, chronic diastolic heart failure, atrial fibrillation, arthritis, osteoporosis, anxiety, depression, and requires portable oxygen  are also affecting patient's functional outcome.   REHAB POTENTIAL: Fair    CLINICAL DECISION MAKING: Evolving/moderate complexity  EVALUATION COMPLEXITY: Moderate   GOALS: Goals reviewed with patient? Yes  SHORT TERM GOALS: Target date: 02/06/24 Patient will be independent with her initial HEP. Baseline: Goal status: MET  2.  Patient will improve her 5 times sit to stand time to 21 seconds or less for improved lower extremity power. Baseline: 2/10: 24.01 seconds Goal status: IN PROGRESS  3.  Patient will improve her timed up and go time to 21 seconds or less for improved functional mobility. Baseline: 2/10: 27.13 Goal status: IN PROGRESS  LONG TERM GOALS: Target date: 02/27/24  Patient will be independent with her advanced HEP. Baseline:  Goal status: IN PROGRESS  2.  Patient will be able to transfer with minimal to no upper extremity support for improved independence. Baseline:  Goal status: IN PROGRESS  3.  Patient will improve her timed up and go time to 15 seconds or less for improved safety and reduced fall risk. Baseline:  Goal status: IN PROGRESS  4.  Patient will improve her 5 times sit to stand time to 15 seconds or less to reduce her fall  risk. Baseline:  Goal status: IN PROGRESS  PLAN:  PT FREQUENCY: 2x/week  PT DURATION: 6 weeks  PLANNED INTERVENTIONS: 97164- PT Re-evaluation, 97110-Therapeutic exercises, 97530- Therapeutic activity, 97112- Neuromuscular re-education, 97535- Self Care, 16109- Manual therapy, [phone]- Gait training, Patient/Family education, Balance training, Stair training, Joint mobilization, Cryotherapy, and Moist heat  PLAN FOR NEXT SESSION: Nustep, lower extremity strengthening, gait training, and manual therapy as needed   Newman Pies, PTA 02/06/2024, 3:27 PM

## 2024-02-11 ENCOUNTER — Ambulatory Visit: Payer: 59

## 2024-02-11 DIAGNOSIS — R2681 Unsteadiness on feet: Secondary | ICD-10-CM

## 2024-02-11 DIAGNOSIS — M25561 Pain in right knee: Secondary | ICD-10-CM | POA: Diagnosis not present

## 2024-02-11 DIAGNOSIS — G8929 Other chronic pain: Secondary | ICD-10-CM

## 2024-02-11 DIAGNOSIS — J9611 Chronic respiratory failure with hypoxia: Secondary | ICD-10-CM | POA: Diagnosis not present

## 2024-02-11 DIAGNOSIS — R0609 Other forms of dyspnea: Secondary | ICD-10-CM | POA: Diagnosis not present

## 2024-02-11 DIAGNOSIS — M25562 Pain in left knee: Secondary | ICD-10-CM | POA: Diagnosis not present

## 2024-02-11 NOTE — Therapy (Signed)
OUTPATIENT PHYSICAL THERAPY LOWER EXTREMITY TREATMENT   Patient Name: Danielle Parrish MRN: [id number] DOB:Jul 14, 1941, 83 y.o., female Today's Date: 02/11/2024  END OF SESSION:  PT End of Session - 02/11/24 1532     Visit Number 8    Number of Visits 12    Date for PT Re-Evaluation 03/21/24    PT Start Time 1521    Equipment Utilized During Treatment Oxygen    Activity Tolerance Patient tolerated treatment well    Behavior During Therapy [facility] for tasks assessed/performed              Past Medical History:  Diagnosis Date   Anxiety    Arthritis    CHF (congestive heart failure) (HCC)    Chronic pain of multiple joints    Depression    Essential hypertension    GERD (gastroesophageal reflux disease)    Hyperlipidemia    Hyperlipidemia associated with type 2 diabetes mellitus (HCC)    Hypertension associated with type 2 diabetes mellitus (HCC)    Hypothyroidism    Mild scoliosis 10/19/2021   Osteoporosis    Sleep apnea    CPAP   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, left 09/05/2023   left wrist   needs excision   Squamous cell carcinoma of skin 09/05/2023   left ear   needs mohs   Type 2 diabetes mellitus with hypercholesterolemia (HCC)    Vitamin D deficiency    Past Surgical History:  Procedure Laterality Date   ABDOMINAL HERNIA REPAIR     ABDOMINAL HYSTERECTOMY  1977   BREAST EXCISIONAL BIOPSY Right    EYE SURGERY  2020   bilateral cataract removal    KNEE ARTHROSCOPY Left 09/30/2014   Procedure: LEFT KNEE ARTHROSCOPY MEDIAL MENISECTOMY ABRASION CHONDROPLASTY SYNOVECTOMY SUPRAPATELLER CUFF;  Surgeon: Jacki Cones, MD;  Location: [facility];  Service: Orthopedics;  Laterality: Left;   Patient Active Problem List   Diagnosis Date Noted   Chronic anticoagulation 09/07/2023   Paroxysmal atrial fibrillation (HCC) 06/19/2023   Hypercoagulable state due to paroxysmal atrial fibrillation (HCC) 06/19/2023   Chronic respiratory failure with hypoxia (HCC)  07/10/2022   Chronic midline low back pain without sciatica 01/30/2019   Age-related osteoporosis with current pathol fracture of vertebra (HCC) 10/28/2018   Calcification of aorta (HCC) 10/02/2018   Compression fracture of T12 vertebra (HCC) 10/01/2018   Primary osteoarthritis of right knee 07/20/2017   Varicose veins of left lower extremity with complications 07/31/2016   OSA (obstructive sleep apnea) 09/14/2015   Chronic diastolic heart failure (HCC) 09/10/2015   Dysphagia, pharyngoesophageal phase 08/04/2015   Anorexia 08/04/2015   Malaise and fatigue 08/04/2015   Depression, recurrent (HCC) 04/08/2015   Vitamin D deficiency    Anxiety    GERD (gastroesophageal reflux disease)    Arthritis    Hypertension associated with type 2 diabetes mellitus (HCC) 05/15/2013   Hyperlipidemia associated with type 2 diabetes mellitus (HCC) 05/15/2013   Type 2 diabetes mellitus with hypercholesterolemia (HCC) 05/15/2013   Morbid obesity (HCC) 05/15/2013   Hypothyroidism 05/15/2013    PCP: Dettinger, Elige Radon, MD  REFERRING PROVIDER: Ranee Gosselin, MD   REFERRING DIAG: Right knee pain, Left knee pain   THERAPY DIAG:  Unsteadiness on feet  Chronic pain of right knee  Chronic pain of left knee  Rationale for Evaluation and Treatment: Rehabilitation  ONSET DATE: "years ago"   SUBJECTIVE:   SUBJECTIVE STATEMENT: Pt denies any pain today.  PERTINENT HISTORY: Hypertension, diabetes type 2, chronic  diastolic heart failure, atrial fibrillation, arthritis, osteoporosis, anxiety, depression, and requires portable oxygen PAIN:  Are you having pain? Yes: NPRS scale: Current: 0/10 Best: 0/10 Worst: 9/10 Pain location: both knees (Left knee worse than the right)  Pain description: intermittent sharp pain  Aggravating factors: cold weather Relieving factors: medication and injections  PRECAUTIONS: None  RED FLAGS: None   WEIGHT BEARING RESTRICTIONS: No  FALLS:  Has patient fallen  in last 6 months? No  LIVING ENVIRONMENT: Lives with: lives with their family Lives in: House/apartment Stairs: Yes: Internal: 20 steps; on right going up; step to pattern Has following equipment at home: Single point cane (patient only uses the cane when she is away from home)   OCCUPATION: retired  PLOF: Independent  PATIENT GOALS: walk better  NEXT MD VISIT: none scheduled  OBJECTIVE:  Note: Objective measures were completed at Evaluation unless otherwise noted.  COGNITION: Overall cognitive status: Within functional limits for tasks assessed     SENSATION: Patient reports no numbness or tingling  EDEMA:  No significant edema observed  POSTURE: rounded shoulders, forward head, decreased lumbar lordosis, and increased thoracic kyphosis  PALPATION: No tenderness to palpation reported  LOWER EXTREMITY ROM: assessed in sitting  Active ROM Right eval Left eval  Hip flexion    Hip extension    Hip abduction    Hip adduction    Hip internal rotation    Hip external rotation    Knee flexion 117 107  Knee extension 6 11  Ankle dorsiflexion    Ankle plantarflexion    Ankle inversion    Ankle eversion     (Blank rows = not tested)  LOWER EXTREMITY MMT:  MMT Right eval Left eval  Hip flexion 4-/5 4+/5  Hip extension    Hip abduction    Hip adduction    Hip internal rotation    Hip external rotation    Knee flexion 4+/5 4+/5  Knee extension 4+/5 5/5  Ankle dorsiflexion 4/5 4/5  Ankle plantarflexion    Ankle inversion    Ankle eversion     (Blank rows = not tested)  FUNCTIONAL TESTS:  5 times sit to stand: 28.17 seconds with single armrest and SPC  Timed up and go (TUG): 28.32 seconds with SPC and oxygen  GAIT: Assistive device utilized: Single point cane Level of assistance: Modified independence Comments: Decreased gait speed and stride length with poor foot clearance, heel strike, and toe off bilaterally                                                                                                                                 TREATMENT DATE:  02/11/24    EXERCISE LOG  Exercise Repetitions and Resistance Comments  Nustep Lvl 3 x 15 mins   Cybex Knee Flexion 20# x 3.5  mins   Cybex Knee Extension 10# x 3 mins   Rockerboard     LAQs  Seated Marches 3# x 25 reps bil   Seated Hip Adduction    Seated Hip Abduction Green x 4 mins   Seated Ham Curls    STS X 10 reps   Rows Yellow x 20 reps   Extensions Yellow x 20 reps    Blank cell = exercise not performed today                                    01/23/24 EXERCISE LOG  Exercise Repetitions and Resistance Comments  Nustep  L4 x 17 minutes   LAQ 3# x 3 minutes    Marching on foam  3 minutes  BUE support  Step up  6" step x 10 reps each  With BUE support  Seated marching 3# x 2.5 minutes    Blank cell = exercise not performed today                  01/21/24                   EXERCISE LOG  Exercise Repetitions and Resistance Comments  Nustep Lvl 1 x 10 mins   Rockerboard  (Seated) 3 mins   LAQs 2# x 20 reps bils   Seated Marches 2# x 15 reps bils   Seated Hip Adduction 3 mins   Seated Hip Abduction Red x 3 mins   Seated Ham Curls Red x 20 reps bil    Blank cell = exercise not performed today   PATIENT EDUCATION:  Education details: progress with physical therapy and benefits of exercise Person educated: Patient Education method: Explanation Education comprehension: verbalized understanding  HOME EXERCISE PROGRAM:   ASSESSMENT:  CLINICAL IMPRESSION: Pt arrives for today's treatment session denying any pain.  Pt requested to perform cybex knee flexion and extension today.  Pt able to perform all cybex exercises well with min cues for proper technique and eccentric control.  Pt able to tolerate increased reps with all previously performed exercises.  Pt denied any pain upon completion of today's treatment session.  OBJECTIVE IMPAIRMENTS: Abnormal gait, decreased  activity tolerance, decreased balance, decreased mobility, difficulty walking, decreased ROM, decreased strength, and pain.   ACTIVITY LIMITATIONS: carrying, lifting, squatting, stairs, transfers, and locomotion level  PARTICIPATION LIMITATIONS: meal prep, cleaning, laundry, shopping, and community activity  PERSONAL FACTORS: Age, Past/current experiences, Time since onset of injury/illness/exacerbation, Transportation, and 3+ comorbidities: Hypertension, diabetes type 2, chronic diastolic heart failure, atrial fibrillation, arthritis, osteoporosis, anxiety, depression, and requires portable oxygen  are also affecting patient's functional outcome.   REHAB POTENTIAL: Fair    CLINICAL DECISION MAKING: Evolving/moderate complexity  EVALUATION COMPLEXITY: Moderate   GOALS: Goals reviewed with patient? Yes  SHORT TERM GOALS: Target date: 02/06/24 Patient will be independent with her initial HEP. Baseline: Goal status: MET  2.  Patient will improve her 5 times sit to stand time to 21 seconds or less for improved lower extremity power. Baseline: 2/10: 24.01 seconds Goal status: IN PROGRESS  3.  Patient will improve her timed up and go time to 21 seconds or less for improved functional mobility. Baseline: 2/10: 27.13 Goal status: IN PROGRESS  LONG TERM GOALS: Target date: 02/27/24  Patient will be independent with her advanced HEP. Baseline:  Goal status: IN PROGRESS  2.  Patient will be able to transfer with minimal to no upper extremity support for improved independence. Baseline:  Goal status: IN PROGRESS  3.  Patient will improve her timed up and go time to 15 seconds or less for improved safety and reduced fall risk. Baseline:  Goal status: IN PROGRESS  4.  Patient will improve her 5 times sit to stand time to 15 seconds or less to reduce her fall risk. Baseline:  Goal status: IN PROGRESS  PLAN:  PT FREQUENCY: 2x/week  PT DURATION: 6 weeks  PLANNED INTERVENTIONS:  97164- PT Re-evaluation, 97110-Therapeutic exercises, 97530- Therapeutic activity, 97112- Neuromuscular re-education, 97535- Self Care, 16109- Manual therapy, [phone]- Gait training, Patient/Family education, Balance training, Stair training, Joint mobilization, Cryotherapy, and Moist heat  PLAN FOR NEXT SESSION: Nustep, lower extremity strengthening, gait training, and manual therapy as needed   Newman Pies, PTA 02/11/2024, 4:47 PM

## 2024-02-18 ENCOUNTER — Encounter: Payer: Self-pay | Admitting: Nurse Practitioner

## 2024-02-18 ENCOUNTER — Ambulatory Visit: Payer: 59 | Attending: Nurse Practitioner | Admitting: Nurse Practitioner

## 2024-02-18 VITALS — BP 136/82 | HR 72 | Ht 62.0 in | Wt 189.0 lb

## 2024-02-18 DIAGNOSIS — G4733 Obstructive sleep apnea (adult) (pediatric): Secondary | ICD-10-CM | POA: Diagnosis not present

## 2024-02-18 DIAGNOSIS — I4891 Unspecified atrial fibrillation: Secondary | ICD-10-CM | POA: Diagnosis not present

## 2024-02-18 DIAGNOSIS — R5383 Other fatigue: Secondary | ICD-10-CM

## 2024-02-18 DIAGNOSIS — J9611 Chronic respiratory failure with hypoxia: Secondary | ICD-10-CM | POA: Diagnosis not present

## 2024-02-18 DIAGNOSIS — I10 Essential (primary) hypertension: Secondary | ICD-10-CM

## 2024-02-18 DIAGNOSIS — E669 Obesity, unspecified: Secondary | ICD-10-CM

## 2024-02-18 DIAGNOSIS — E785 Hyperlipidemia, unspecified: Secondary | ICD-10-CM

## 2024-02-18 NOTE — Progress Notes (Unsigned)
 Cardiology Office Note:  .   Date:  02/18/2024 ID:  Danielle Parrish, DOB 19-Aug-1941, MRN [id number] PCP: Dettinger, Elige Radon, MD  [facility] Providers Cardiologist:  Marjo Bicker, MD    History of Present Illness: .   Danielle Parrish is a 83 y.o. female with a PMH of PAF, hypertension, hyperlipidemia, type 2 diabetes, chronic hypoxic respiratory failure on oxygen, OSA on CPAP, who presents today for 76-month follow-up appointment.  Newly diagnosed with A-fib in June 2024 while in the pulmonology office.  She was sent to the ED, spontaneously converted to normal sinus rhythm.  Last seen by Dr. Jenene Slicker on September 07, 2023.  She was overall doing well at the time, but pt did note symptomatic DOE.  2-week event monitor was arranged to rule out any intermittent episodes of A-fib.  Lasix was increased to 40 mg daily.  01/15/2024 - Today she presents for 3 month follow-up. She states she never received the heart monitor after last office visit/wore a heart monitor. She notes recent stress over the past few months. Daughter passed away recently and holiday season was difficult she states, denies any red flag signs/symptoms. Says overall she is doing fairly well. States when she takes off her O2 temporarily, she notes her SpO2 saturation and HR dropping but then both return to normal after putting her O2 back on. Denies any chest pain, palpitations, syncope, presyncope, dizziness, orthopnea, PND, swelling or significant weight changes, acute bleeding, or claudication. Denies any recent worsening SHOB. Tells she she has recently joined an exercise class and enjoying this very much, also will be starting PT soon.  02/18/2024 - Presents today for follow-up. Admits to fatigue, unsure what this is due to.  She confirms compliance with CPAP. Denies any chest pain, worsening shortness of breath, palpitations, syncope, presyncope, dizziness, orthopnea, PND, swelling or significant weight  changes, acute bleeding, or claudication.  ROS: Negative. See HPI.   Studies Reviewed: Marland Kitchen    EKG: EKG is not ordered today.  Cardiac monitor 01/2024:   Patch wear time was for 13 days and 23 hours.   Normal sinus rhythm predominantly ranging from 38 to 203 bpm with an average HR 65 bpm.   44 runs of SVT, fastest interval lasting 11 beats/3.6 seconds and the longest interval lasting 24 beats/12 seconds.   3 runs of NSVT, fastest interval lasting 4 beats and the longest interval lasting 6 beats.   No evidence of high-grade AV block or pauses.   <1% PAC burden and <1% PVC burden.   No patient triggered events noted.  Echo 06/2022:   1. Left ventricular ejection fraction, by estimation, is 60 to 65%. The  left ventricle has normal function. The left ventricle has no regional  wall motion abnormalities. There is mild left ventricular hypertrophy.  Left ventricular diastolic parameters  are consistent with Grade I diastolic dysfunction (impaired relaxation).  The average left ventricular global longitudinal strain is -19.2 %. The  global longitudinal strain is normal.   2. Right ventricular systolic function is low normal. The right  ventricular size is normal. There is normal pulmonary artery systolic  pressure. The estimated right ventricular systolic pressure is 7.2 mmHg.   3. The mitral valve is degenerative. Trivial mitral valve regurgitation.   4. The aortic valve is tricuspid. There is mild calcification of the  aortic valve. Aortic valve regurgitation is mild. Mild aortic valve  stenosis. Aortic regurgitation PHT measures 1034 msec. Aortic valve area,  by  VTI measures 1.86 cm. Aortic valve  mean gradient measures 9.0 mmHg.   5. The inferior vena cava is normal in size with greater than 50%  respiratory variability, suggesting right atrial pressure of 3 mmHg.   Comparison(s): Prior images unable to be directly viewed.  Holter monitor 09/2015:  48-hour Holter monitor reviewed.  Sinus rhythm present with heart rate ranging from 36 bpm just briefly up to 90 bpm, average heart rate 56 bpm. Lowest sustained heart rate was in the mid 40s. Occasional PACs and PVCs were noted. Brief bursts of PAT ranging 5-7 beats also noted. There were no significant pauses, no atrial fibrillation. No obvious correlating symptoms described.  Physical Exam:   VS:  BP 136/82   Pulse 72   Ht 5\' 2"  (1.575 m)   Wt 189 lb (85.7 kg)   SpO2 94% Comment: 5 litters  BMI 34.57 kg/m    Wt Readings from Last 3 Encounters:  02/18/24 189 lb (85.7 kg)  01/24/24 187 lb (84.8 kg)  01/15/24 185 lb (83.9 kg)    GEN: Obese, 83 y.o. female in no acute distress, wearing chronic O2 NECK: No JVD; No carotid bruits CARDIAC: S1/S2, RRR, no murmurs, rubs, gallops RESPIRATORY:  Clear to auscultation without rales, wheezing or rhonchi  ABDOMEN: Soft, non-tender, non-distended EXTREMITIES:  No edema; No deformity   ASSESSMENT AND PLAN: .    A-fib Denies any tachycardia or palpitations.  Recent monitor revealed normal sinus rhythm with average heart rate of 65 bpm.  Had occasional runs of SVT that were brief in duration.  3 runs of NSVT, longest episode lasted 6 beats.  No evidence of high-grade AV blocks or pauses.  Continue Toprol-XL.Continue Eliquis 5 mg BID for stroke prevention, denies any bleeding issues. On appropriate dosage. Continue Toprol XL.   HTN Blood pressure stable and well-controlled. Discussed to monitor BP at home at least 2 hours after medications and sitting for 5-10 minutes.  No medication changes at this time. Heart healthy diet and regular cardiovascular exercise encouraged.   HLD LDL 73 03/2023.  This is being managed by PCP.  Continue lovastatin. Heart healthy diet and regular cardiovascular exercise encouraged.  At next office visit, will plan to request labs from PCP's office.  Chronic hypoxic respiratory failure on oxygen, OSA on CPAP Denies any worsening symptoms.  Breathing is  stable per her report.  She is compliant with CPAP.  Continue to follow-up with PCP and Dr. Sherene Sires.   Obesity Weight loss via diet and exercise encouraged. Discussed the impact being overweight would have on cardiovascular risk.    6. Fatigue Etiology unclear and multifactorial.  Will update echocardiogram at this time for further evaluation.  Continue to follow-up with PCP.  No medication changes at this time.  Dispo: Follow-up with me/APP in 3-4 months or sooner anything changes.  Signed, Sharlene Dory, NP

## 2024-02-18 NOTE — Patient Instructions (Addendum)
 Medication Instructions:  Your physician recommends that you continue on your current medications as directed. Please refer to the Current Medication list given to you today.  Labwork: None  Testing/Procedures: Your physician has requested that you have an echocardiogram. Echocardiography is a painless test that uses sound waves to create images of your heart. It provides your doctor with information about the size and shape of your heart and how well your heart's chambers and valves are working. This procedure takes approximately one hour. There are no restrictions for this procedure. Please do NOT wear cologne, perfume, aftershave, or lotions (deodorant is allowed). Please arrive 15 minutes prior to your appointment time.  Please note: We ask at that you not bring children with you during ultrasound (echo/ vascular) testing. Due to room size and safety concerns, children are not allowed in the ultrasound rooms during exams. Our front office staff cannot provide observation of children in our lobby area while testing is being conducted. An adult accompanying a patient to their appointment will only be allowed in the ultrasound room at the discretion of the ultrasound technician under special circumstances. We apologize for any inconvenience.  Follow-Up: Your physician recommends that you schedule a follow-up appointment in: 3-4 months   Any Other Special Instructions Will Be Listed Below (If Applicable).  If you need a refill on your cardiac medications before your next appointment, please call your pharmacy.

## 2024-02-20 ENCOUNTER — Ambulatory Visit (INDEPENDENT_AMBULATORY_CARE_PROVIDER_SITE_OTHER): Payer: 59 | Admitting: Dermatology

## 2024-02-20 ENCOUNTER — Encounter: Payer: Self-pay | Admitting: Dermatology

## 2024-02-20 VITALS — BP 106/60 | HR 85

## 2024-02-20 DIAGNOSIS — L570 Actinic keratosis: Secondary | ICD-10-CM

## 2024-02-20 DIAGNOSIS — Z86007 Personal history of in-situ neoplasm of skin: Secondary | ICD-10-CM | POA: Diagnosis not present

## 2024-02-20 DIAGNOSIS — Z5111 Encounter for antineoplastic chemotherapy: Secondary | ICD-10-CM | POA: Diagnosis not present

## 2024-02-20 DIAGNOSIS — L905 Scar conditions and fibrosis of skin: Secondary | ICD-10-CM | POA: Diagnosis not present

## 2024-02-20 DIAGNOSIS — W908XXA Exposure to other nonionizing radiation, initial encounter: Secondary | ICD-10-CM | POA: Diagnosis not present

## 2024-02-20 DIAGNOSIS — D0460 Carcinoma in situ of skin of unspecified upper limb, including shoulder: Secondary | ICD-10-CM

## 2024-02-20 NOTE — Patient Instructions (Signed)

## 2024-02-20 NOTE — Progress Notes (Signed)
 Follow Up Visit   Subjective  Danielle Parrish is a 83 y.o. 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 The patient has been bandaging the wound as directed. The endorse the following concerns: none  She was unable to start using the topical 5FU on her cheeks, despite filling the medication.  The following portions of the chart were reviewed this encounter and updated as appropriate: medications, allergies, medical history  Review of Systems:  No other skin or systemic complaints except as noted in HPI or Assessment and Plan.  Objective  Well appearing patient in no apparent distress; mood and affect are within normal limits.  A full examination was performed including scalp, head, face and left wrist. All findings within normal limits unless otherwise noted below.  Healing wound with mild erythema  Relevant physical exam findings are noted in the Assessment and Plan.  left dorsal hand x4, left jaw x 1 (5) Erythematous thin papules/macules with gritty scale.   Assessment & Plan   ACTINIC KERATOSIS Exam: Erythematous thin papules/macules with gritty scale at the bilateral cheeks  Actinic keratoses are precancerous spots that appear secondary to cumulative UV radiation exposure/sun exposure over time. They are chronic with expected duration over 1 year. A portion of actinic keratoses will progress to squamous cell carcinoma of the skin. It is not possible to reliably predict which spots will progress to skin cancer and so treatment is recommended to prevent development of skin cancer.  Recommend daily broad spectrum sunscreen SPF 30+ to sun-exposed areas, reapply every 2 hours as needed.  Recommend staying in the shade or wearing long sleeves, sun glasses (UVA+UVB protection) and wide brim hats (4-inch brim around the entire  circumference of the hat). Call for new or changing lesions.  Treatment Plan: Start 5-fluorouracil cream twice a day for 14 days to affected areas including bilateral cheeks.  Reviewed course of treatment and expected reaction.  Patient advised to expect inflammation and crusting and advised that erosions are possible.  Patient advised to be diligent with sun protection during and after treatment. Handout with details of how to apply medication and what to expect provided. Counseled to keep medication out of reach of children and pets.  Reviewed course of treatment and expected reaction.  Patient advised to expect inflammation and crusting and advised that erosions are possible.  Patient advised to be diligent with sun protection during and after treatment. Handout with details of how to apply medication and what to expect provided. Counseled to keep medication out of reach of children and pets.  Healing s/p Mohs for CIS on the left ear lobe, treated on 10/01/2024, healed by secondary intention - Reassured that wound is healing well - No evidence of infection - No swelling, induration, purulence, dehiscence, or tenderness out of proportion to the clinical exam, see photo above - Discussed that scars take up to 12 months to mature from the date of surgery - Recommend SPF 30+ to scar daily to prevent purple color from UV exposure during scar maturation process - Discussed that erythema and raised appearance of scar will fade over the next 4-6 months - OK to start scar massage at 4-6 weeks post-op - Can consider silicone based products for scar healing starting at 6 weeks post-op  Healing s/p WLE for CIS on the left wrist, treated  on 10/15/2024, healed by linear closure - Reassured that wound is healing well - No evidence of infection - No swelling, induration, purulence, dehiscence, or tenderness out of proportion to the clinical exam, see photo above - Discussed that scars take up to 12 months  to mature from the date of surgery - Recommend SPF 30+ to scar daily to prevent purple color from UV exposure during scar maturation process - Discussed that erythema and raised appearance of scar will fade over the next 4-6 months - OK to start scar massage at 4-6 weeks post-op - Can consider silicone based products for scar healing starting at 6 weeks post-op AK (ACTINIC KERATOSIS) (5) left dorsal hand x4, left jaw x 1 (5) Destruction of lesion - left dorsal hand x4, left jaw x 1 (5) Complexity: simple   Destruction method: cryotherapy   Informed consent: discussed and consent obtained   Timeout:  patient name, date of birth, surgical site, and procedure verified Lesion destroyed using liquid nitrogen: Yes   Outcome: patient tolerated procedure well with no complications   Post-procedure details: wound care instructions given   SQUAMOUS CELL CARCINOMA IN SITU OF SKIN OF WRIST, UNSPECIFIED LATERALITY   SCAR    Return in about 2 months (around 04/19/2024) for AK f/u.  I, Tillie Fantasia, CMA, am acting as scribe for Gwenith Daily, MD.   Documentation: I have reviewed the above documentation for accuracy and completeness, and I agree with the above.  Gwenith Daily, MD

## 2024-02-22 ENCOUNTER — Other Ambulatory Visit: Payer: Self-pay | Admitting: Dermatology

## 2024-02-22 ENCOUNTER — Other Ambulatory Visit: Payer: Self-pay | Admitting: Family Medicine

## 2024-02-22 DIAGNOSIS — L570 Actinic keratosis: Secondary | ICD-10-CM

## 2024-02-25 ENCOUNTER — Ambulatory Visit: Payer: Self-pay | Admitting: Internal Medicine

## 2024-02-25 ENCOUNTER — Ambulatory Visit: Payer: 59 | Attending: Orthopedic Surgery

## 2024-02-25 DIAGNOSIS — M25561 Pain in right knee: Secondary | ICD-10-CM | POA: Diagnosis not present

## 2024-02-25 DIAGNOSIS — R2681 Unsteadiness on feet: Secondary | ICD-10-CM | POA: Diagnosis not present

## 2024-02-25 DIAGNOSIS — G8929 Other chronic pain: Secondary | ICD-10-CM | POA: Diagnosis not present

## 2024-02-25 DIAGNOSIS — M25562 Pain in left knee: Secondary | ICD-10-CM | POA: Insufficient documentation

## 2024-02-25 DIAGNOSIS — M545 Low back pain, unspecified: Secondary | ICD-10-CM | POA: Diagnosis not present

## 2024-02-25 NOTE — Therapy (Signed)
 OUTPATIENT PHYSICAL THERAPY LOWER EXTREMITY TREATMENT   Patient Name: Danielle Parrish MRN: [id number] DOB:1941/06/15, 83 y.o., female Today's Date: 02/25/2024  END OF SESSION:  PT End of Session - 02/25/24 1437     Visit Number 9    Number of Visits 12    Date for PT Re-Evaluation 03/21/24    PT Start Time 1430    PT Stop Time 1512    PT Time Calculation (min) 42 min    Equipment Utilized During Treatment Oxygen    Activity Tolerance Patient tolerated treatment well    Behavior During Therapy WFL for tasks assessed/performed              Past Medical History:  Diagnosis Date   Anxiety    Arthritis    CHF (congestive heart failure) (HCC)    Chronic pain of multiple joints    Depression    Essential hypertension    GERD (gastroesophageal reflux disease)    Hyperlipidemia    Hyperlipidemia associated with type 2 diabetes mellitus (HCC)    Hypertension associated with type 2 diabetes mellitus (HCC)    Hypothyroidism    Mild scoliosis 10/19/2021   Osteoporosis    Sleep apnea    CPAP   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, left 09/05/2023   left wrist   needs excision   Squamous cell carcinoma of skin 09/05/2023   left ear   needs mohs   Type 2 diabetes mellitus with hypercholesterolemia (HCC)    Vitamin D deficiency    Past Surgical History:  Procedure Laterality Date   ABDOMINAL HERNIA REPAIR     ABDOMINAL HYSTERECTOMY  1977   BREAST EXCISIONAL BIOPSY Right    EYE SURGERY  2020   bilateral cataract removal    KNEE ARTHROSCOPY Left 09/30/2014   Procedure: LEFT KNEE ARTHROSCOPY MEDIAL MENISECTOMY ABRASION CHONDROPLASTY SYNOVECTOMY SUPRAPATELLER CUFF;  Surgeon: Jacki Cones, MD;  Location: [facility];  Service: Orthopedics;  Laterality: Left;   Patient Active Problem List   Diagnosis Date Noted   Chronic anticoagulation 09/07/2023   Paroxysmal atrial fibrillation (HCC) 06/19/2023   Hypercoagulable state due to paroxysmal atrial fibrillation (HCC) 06/19/2023    Chronic respiratory failure with hypoxia (HCC) 07/10/2022   Chronic midline low back pain without sciatica 01/30/2019   Age-related osteoporosis with current pathol fracture of vertebra (HCC) 10/28/2018   Calcification of aorta (HCC) 10/02/2018   Compression fracture of T12 vertebra (HCC) 10/01/2018   Primary osteoarthritis of right knee 07/20/2017   Varicose veins of left lower extremity with complications 07/31/2016   OSA (obstructive sleep apnea) 09/14/2015   Chronic diastolic heart failure (HCC) 09/10/2015   Dysphagia, pharyngoesophageal phase 08/04/2015   Anorexia 08/04/2015   Malaise and fatigue 08/04/2015   Depression, recurrent (HCC) 04/08/2015   Vitamin D deficiency    Anxiety    GERD (gastroesophageal reflux disease)    Arthritis    Hypertension associated with type 2 diabetes mellitus (HCC) 05/15/2013   Hyperlipidemia associated with type 2 diabetes mellitus (HCC) 05/15/2013   Type 2 diabetes mellitus with hypercholesterolemia (HCC) 05/15/2013   Morbid obesity (HCC) 05/15/2013   Hypothyroidism 05/15/2013    PCP: Dettinger, Elige Radon, MD  REFERRING PROVIDER: Ranee Gosselin, MD   REFERRING DIAG: Right knee pain, Left knee pain   THERAPY DIAG:  Unsteadiness on feet  Chronic pain of right knee  Chronic pain of left knee  Rationale for Evaluation and Treatment: Rehabilitation  ONSET DATE: "years ago"   SUBJECTIVE:  SUBJECTIVE STATEMENT: Pt denies any pain today, but reports feeling tired and SHOB today.    PERTINENT HISTORY: Hypertension, diabetes type 2, chronic diastolic heart failure, atrial fibrillation, arthritis, osteoporosis, anxiety, depression, and requires portable oxygen PAIN:  Are you having pain? No  PRECAUTIONS: None  RED FLAGS: None   WEIGHT BEARING RESTRICTIONS: No  FALLS:  Has patient fallen in last 6 months? No  LIVING ENVIRONMENT: Lives with: lives with their family Lives in: House/apartment Stairs: Yes: Internal: 20 steps;  on right going up; step to pattern Has following equipment at home: Single point cane (patient only uses the cane when she is away from home)   OCCUPATION: retired  PLOF: Independent  PATIENT GOALS: walk better  NEXT MD VISIT: none scheduled  OBJECTIVE:  Note: Objective measures were completed at Evaluation unless otherwise noted.  COGNITION: Overall cognitive status: Within functional limits for tasks assessed     SENSATION: Patient reports no numbness or tingling  EDEMA:  No significant edema observed  POSTURE: rounded shoulders, forward head, decreased lumbar lordosis, and increased thoracic kyphosis  PALPATION: No tenderness to palpation reported  LOWER EXTREMITY ROM: assessed in sitting  Active ROM Right eval Left eval  Hip flexion    Hip extension    Hip abduction    Hip adduction    Hip internal rotation    Hip external rotation    Knee flexion 117 107  Knee extension 6 11  Ankle dorsiflexion    Ankle plantarflexion    Ankle inversion    Ankle eversion     (Blank rows = not tested)  LOWER EXTREMITY MMT:  MMT Right eval Left eval  Hip flexion 4-/5 4+/5  Hip extension    Hip abduction    Hip adduction    Hip internal rotation    Hip external rotation    Knee flexion 4+/5 4+/5  Knee extension 4+/5 5/5  Ankle dorsiflexion 4/5 4/5  Ankle plantarflexion    Ankle inversion    Ankle eversion     (Blank rows = not tested)  FUNCTIONAL TESTS:  5 times sit to stand: 28.17 seconds with single armrest and SPC  Timed up and go (TUG): 28.32 seconds with SPC and oxygen  GAIT: Assistive device utilized: Single point cane Level of assistance: Modified independence Comments: Decreased gait speed and stride length with poor foot clearance, heel strike, and toe off bilaterally                                                                                                                                TREATMENT DATE:  02/25/24    EXERCISE LOG  Exercise  Repetitions and Resistance Comments  Nustep Lvl 3 x 15 mins   Cybex Knee Flexion 20# x 3.5 mins   Cybex Knee Extension 10# x 3.5 mins   Rockerboard     LAQs    Seated Marches 3# x 25 reps bil  Seated Hip Adduction    Seated Hip Abduction Green x 4 mins   Seated Ham Curls    STS    Rows Yellow x 20 reps   Extensions Yellow x 20 reps    Blank cell = exercise not performed today                                    01/23/24 EXERCISE LOG  Exercise Repetitions and Resistance Comments  Nustep  L4 x 17 minutes   LAQ 3# x 3 minutes    Marching on foam  3 minutes  BUE support  Step up  6" step x 10 reps each  With BUE support  Seated marching 3# x 2.5 minutes    Blank cell = exercise not performed today                  01/21/24                   EXERCISE LOG  Exercise Repetitions and Resistance Comments  Nustep Lvl 1 x 10 mins   Rockerboard  (Seated) 3 mins   LAQs 2# x 20 reps bils   Seated Marches 2# x 15 reps bils   Seated Hip Adduction 3 mins   Seated Hip Abduction Red x 3 mins   Seated Ham Curls Red x 20 reps bil    Blank cell = exercise not performed today   PATIENT EDUCATION:  Education details: progress with physical therapy and benefits of exercise Person educated: Patient Education method: Explanation Education comprehension: verbalized understanding  HOME EXERCISE PROGRAM:   ASSESSMENT:  CLINICAL IMPRESSION: Pt arrives for today's treatment session denying any pain, but reports being fatigue and having issues catching her breath today.  Pt encouraged to call her pulmonologist and get an appointment as soon as possible due to increased SHOB and fatigue.  Pt's SHOB decreased to 86% on pt's standard amount of O2 while performing seated exercises.  Pt able to recovery to 90% with PLB.  Pt given seated rest breaks as needed due to fatigue and shortness of breath.  Pt able to maintain 90% SpO2 or greater on all other occasions.  Pt denied any pain at completion of today's  treatment session.  OBJECTIVE IMPAIRMENTS: Abnormal gait, decreased activity tolerance, decreased balance, decreased mobility, difficulty walking, decreased ROM, decreased strength, and pain.   ACTIVITY LIMITATIONS: carrying, lifting, squatting, stairs, transfers, and locomotion level  PARTICIPATION LIMITATIONS: meal prep, cleaning, laundry, shopping, and community activity  PERSONAL FACTORS: Age, Past/current experiences, Time since onset of injury/illness/exacerbation, Transportation, and 3+ comorbidities: Hypertension, diabetes type 2, chronic diastolic heart failure, atrial fibrillation, arthritis, osteoporosis, anxiety, depression, and requires portable oxygen  are also affecting patient's functional outcome.   REHAB POTENTIAL: Fair    CLINICAL DECISION MAKING: Evolving/moderate complexity  EVALUATION COMPLEXITY: Moderate   GOALS: Goals reviewed with patient? Yes  SHORT TERM GOALS: Target date: 02/06/24 Patient will be independent with her initial HEP. Baseline: Goal status: MET  2.  Patient will improve her 5 times sit to stand time to 21 seconds or less for improved lower extremity power. Baseline: 2/10: 24.01 seconds Goal status: IN PROGRESS  3.  Patient will improve her timed up and go time to 21 seconds or less for improved functional mobility. Baseline: 2/10: 27.13 Goal status: IN PROGRESS  LONG TERM GOALS: Target date: 02/27/24  Patient will be independent with her  advanced HEP. Baseline:  Goal status: IN PROGRESS  2.  Patient will be able to transfer with minimal to no upper extremity support for improved independence. Baseline:  Goal status: IN PROGRESS  3.  Patient will improve her timed up and go time to 15 seconds or less for improved safety and reduced fall risk. Baseline:  Goal status: IN PROGRESS  4.  Patient will improve her 5 times sit to stand time to 15 seconds or less to reduce her fall risk. Baseline:  Goal status: IN PROGRESS  PLAN:  PT  FREQUENCY: 2x/week  PT DURATION: 6 weeks  PLANNED INTERVENTIONS: 97164- PT Re-evaluation, 97110-Therapeutic exercises, 97530- Therapeutic activity, 97112- Neuromuscular re-education, 97535- Self Care, [id number]- Manual therapy, [phone]- Gait training, Patient/Family education, Balance training, Stair training, Joint mobilization, Cryotherapy, and Moist heat  PLAN FOR NEXT SESSION: Nustep, lower extremity strengthening, gait training, and manual therapy as needed   Newman Pies, PTA 02/25/2024, 3:32 PM

## 2024-02-25 NOTE — Telephone Encounter (Addendum)
 Chief Complaint: SOB Symptoms: increased SOB above baseline, hypoxia Frequency: 2 weeks Pertinent Negatives: Patient denies fever, URI sx, CP Disposition: [] ED /[x] Urgent Care (no appt availability in office) / [] Appointment(In office/virtual)/ []  [facility]/ [] Home Care/ [x] Refused Recommended Disposition /[] [facility]/ []  Follow-up with PCP Additional Notes: Pt c/o SOB x 2 weeks. Denies CP, URI sx, fevers. Does not take any daily respiratory inhalers, but does have albuterol PRN that was used over the weekend with some relief. Of note, triager can appreciate tachypnea over the phone. Triager reinforced usage of PRN. Pt also endorsing increase SOB above baseline at rest and with exertion as well as lower oxygen readings. Triager strongly recommended UC, but pt stated "I'm not going to go to UC". Triager then advised calling EMS d/t transportation, pt declined. Triager will forward encounter for Dr. Sherene Sires to review and advise. Patient verbalized understanding of disposition.     Reason for Disposition  [1] Longstanding difficulty breathing (e.g., CHF, COPD, emphysema) AND [2] WORSE than normal  Answer Assessment - Initial Assessment Questions E2C2 Pulmonary Triage - Initial Assessment Questions "Chief Complaint (e.g., cough, sob, wheezing, fever, chills, sweat or additional symptoms) *Go to specific symptom protocol after initial questions. SOB, tired  "How long have symptoms been present?" 2 weeks  Have you tested for COVID or Flu? Note: If not, ask patient if a home test can be taken. If so, instruct patient to call back for positive results. No  MEDICINES:   "Have you used any OTC meds to help with symptoms?" No If yes, ask "What medications?" N/a  "Have you used your inhalers/maintenance medication?" No If yes, "What medications?" Albuterol PRN - last used over the weekend with some relief  If inhaler, ask "How many puffs and how often?" Note: Review  instructions on medication in the chart. N/a  OXYGEN: "Do you wear supplemental oxygen?" Yes If yes, "How many liters are you supposed to use?" 5L   "Do you monitor your oxygen levels?" Yes If yes, "What is your reading (oxygen level) today?" 89-90%  "What is your usual oxygen saturation reading?"  (Note: Pulmonary O2 sats should be 90% or greater) 92-95%   1. RESPIRATORY STATUS: "Describe your breathing?" (e.g., wheezing, shortness of breath, unable to speak, severe coughing)      SOB 2. ONSET: "When did this breathing problem begin?"      2 weeks 3. PATTERN "Does the difficult breathing come and go, or has it been constant since it started?"      constant 4. SEVERITY: "How bad is your breathing?" (e.g., mild, moderate, severe)    - MILD: No SOB at rest, mild SOB with walking, speaks normally in sentences, can lie down, no retractions, pulse < 100.    - MODERATE: SOB at rest, SOB with minimal exertion and prefers to sit, cannot lie down flat, speaks in phrases, mild retractions, audible wheezing, pulse 100-120.    - SEVERE: Very SOB at rest, speaks in single words, struggling to breathe, sitting hunched forward, retractions, pulse > 120      Mild-moderate 5. RECURRENT SYMPTOM: "Have you had difficulty breathing before?" If Yes, ask: "When was the last time?" and "What happened that time?"      unknown 6. CARDIAC HISTORY: "Do you have any history of heart disease?" (e.g., heart attack, angina, bypass surgery, angioplasty)      CHF - does not do daily wt - gained 5# the past week but cardiologist not concerned per pt Reports  recently went to heart  7. LUNG HISTORY: "Do you have any history of lung disease?"  (e.g., pulmonary embolus, asthma, emphysema)     OSA 8. CAUSE: "What do you think is causing the breathing problem?"      I dont know 9. OTHER SYMPTOMS: "Do you have any other symptoms? (e.g., dizziness, runny nose, cough, chest pain, fever)     denies  12. TRAVEL: "Have you  traveled out of the country in the last month?" (e.g., travel history, exposures)       no  Protocols used: Breathing Difficulty-A-AH

## 2024-02-27 ENCOUNTER — Other Ambulatory Visit: Payer: Self-pay | Admitting: Family Medicine

## 2024-02-27 ENCOUNTER — Ambulatory Visit: Payer: 59

## 2024-02-27 DIAGNOSIS — F339 Major depressive disorder, recurrent, unspecified: Secondary | ICD-10-CM

## 2024-02-27 DIAGNOSIS — M25562 Pain in left knee: Secondary | ICD-10-CM | POA: Diagnosis not present

## 2024-02-27 DIAGNOSIS — M25561 Pain in right knee: Secondary | ICD-10-CM | POA: Diagnosis not present

## 2024-02-27 DIAGNOSIS — M545 Low back pain, unspecified: Secondary | ICD-10-CM | POA: Diagnosis not present

## 2024-02-27 DIAGNOSIS — G8929 Other chronic pain: Secondary | ICD-10-CM | POA: Diagnosis not present

## 2024-02-27 DIAGNOSIS — R2681 Unsteadiness on feet: Secondary | ICD-10-CM | POA: Diagnosis not present

## 2024-02-27 NOTE — Telephone Encounter (Signed)
 Since the problem is fatigue and cards is working on the do I would start with PCP

## 2024-02-27 NOTE — Telephone Encounter (Signed)
 Called and spoke with patient informed patient of  this message, pt stated that she follow up with her cardiology on yesterday. She said that she is feeling a little better. However she said that she has no energy she said that she has just been wanting to sleep , she said  this isn't normal for her. Did you want  try to bring this pt one this week  as double book  or have her follow up with pcp. Please advise

## 2024-02-27 NOTE — Telephone Encounter (Signed)
 Called and spoke informed patient , she said that she will follow up with her pcp

## 2024-02-27 NOTE — Therapy (Signed)
 OUTPATIENT PHYSICAL THERAPY LOWER EXTREMITY TREATMENT   Patient Name: Danielle Parrish MRN: [id number] DOB:12/04/1941, 83 y.o., female Today's Date: 02/27/2024  END OF SESSION:  PT End of Session - 02/27/24 1436     Visit Number 10    Number of Visits 12    Date for PT Re-Evaluation 03/21/24    PT Start Time 1434    PT Stop Time 1522    PT Time Calculation (min) 48 min    Equipment Utilized During Treatment Oxygen    Activity Tolerance Patient tolerated treatment well    Behavior During Therapy WFL for tasks assessed/performed              Past Medical History:  Diagnosis Date   Anxiety    Arthritis    CHF (congestive heart failure) (HCC)    Chronic pain of multiple joints    Depression    Essential hypertension    GERD (gastroesophageal reflux disease)    Hyperlipidemia    Hyperlipidemia associated with type 2 diabetes mellitus (HCC)    Hypertension associated with type 2 diabetes mellitus (HCC)    Hypothyroidism    Mild scoliosis 10/19/2021   Osteoporosis    Sleep apnea    CPAP   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, left 09/05/2023   left wrist   needs excision   Squamous cell carcinoma of skin 09/05/2023   left ear   needs mohs   Type 2 diabetes mellitus with hypercholesterolemia (HCC)    Vitamin D deficiency    Past Surgical History:  Procedure Laterality Date   ABDOMINAL HERNIA REPAIR     ABDOMINAL HYSTERECTOMY  1977   BREAST EXCISIONAL BIOPSY Right    EYE SURGERY  2020   bilateral cataract removal    KNEE ARTHROSCOPY Left 09/30/2014   Procedure: LEFT KNEE ARTHROSCOPY MEDIAL MENISECTOMY ABRASION CHONDROPLASTY SYNOVECTOMY SUPRAPATELLER CUFF;  Surgeon: Jacki Cones, MD;  Location: [facility];  Service: Orthopedics;  Laterality: Left;   Patient Active Problem List   Diagnosis Date Noted   Chronic anticoagulation 09/07/2023   Paroxysmal atrial fibrillation (HCC) 06/19/2023   Hypercoagulable state due to paroxysmal atrial fibrillation (HCC) 06/19/2023    Chronic respiratory failure with hypoxia (HCC) 07/10/2022   Chronic midline low back pain without sciatica 01/30/2019   Age-related osteoporosis with current pathol fracture of vertebra (HCC) 10/28/2018   Calcification of aorta (HCC) 10/02/2018   Compression fracture of T12 vertebra (HCC) 10/01/2018   Primary osteoarthritis of right knee 07/20/2017   Varicose veins of left lower extremity with complications 07/31/2016   OSA (obstructive sleep apnea) 09/14/2015   Chronic diastolic heart failure (HCC) 09/10/2015   Dysphagia, pharyngoesophageal phase 08/04/2015   Anorexia 08/04/2015   Malaise and fatigue 08/04/2015   Depression, recurrent (HCC) 04/08/2015   Vitamin D deficiency    Anxiety    GERD (gastroesophageal reflux disease)    Arthritis    Hypertension associated with type 2 diabetes mellitus (HCC) 05/15/2013   Hyperlipidemia associated with type 2 diabetes mellitus (HCC) 05/15/2013   Type 2 diabetes mellitus with hypercholesterolemia (HCC) 05/15/2013   Morbid obesity (HCC) 05/15/2013   Hypothyroidism 05/15/2013    PCP: Dettinger, Elige Radon, MD  REFERRING PROVIDER: Ranee Gosselin, MD   REFERRING DIAG: Right knee pain, Left knee pain   THERAPY DIAG:  Unsteadiness on feet  Chronic pain of right knee  Chronic pain of left knee  Rationale for Evaluation and Treatment: Rehabilitation  ONSET DATE: "years ago"   SUBJECTIVE:  SUBJECTIVE STATEMENT: Pt denies any pain today, but reports feeling tired and SHOB today.  Pt states that she called her pulmonologist after her appointment on Monday and was told to go to the ER or Urgent Care, which she declined.  Pt plans to call PCP.   PERTINENT HISTORY: Hypertension, diabetes type 2, chronic diastolic heart failure, atrial fibrillation, arthritis, osteoporosis, anxiety, depression, and requires portable oxygen PAIN:  Are you having pain? No  PRECAUTIONS: None  RED FLAGS: None   WEIGHT BEARING RESTRICTIONS: No  FALLS:   Has patient fallen in last 6 months? No  LIVING ENVIRONMENT: Lives with: lives with their family Lives in: House/apartment Stairs: Yes: Internal: 20 steps; on right going up; step to pattern Has following equipment at home: Single point cane (patient only uses the cane when she is away from home)   OCCUPATION: retired  PLOF: Independent  PATIENT GOALS: walk better  NEXT MD VISIT: none scheduled  OBJECTIVE:  Note: Objective measures were completed at Evaluation unless otherwise noted.  COGNITION: Overall cognitive status: Within functional limits for tasks assessed     SENSATION: Patient reports no numbness or tingling  EDEMA:  No significant edema observed  POSTURE: rounded shoulders, forward head, decreased lumbar lordosis, and increased thoracic kyphosis  PALPATION: No tenderness to palpation reported  LOWER EXTREMITY ROM: assessed in sitting  Active ROM Right eval Left eval  Hip flexion    Hip extension    Hip abduction    Hip adduction    Hip internal rotation    Hip external rotation    Knee flexion 117 107  Knee extension 6 11  Ankle dorsiflexion    Ankle plantarflexion    Ankle inversion    Ankle eversion     (Blank rows = not tested)  LOWER EXTREMITY MMT:  MMT Right eval Left eval  Hip flexion 4-/5 4+/5  Hip extension    Hip abduction    Hip adduction    Hip internal rotation    Hip external rotation    Knee flexion 4+/5 4+/5  Knee extension 4+/5 5/5  Ankle dorsiflexion 4/5 4/5  Ankle plantarflexion    Ankle inversion    Ankle eversion     (Blank rows = not tested)  FUNCTIONAL TESTS:  5 times sit to stand: 28.17 seconds with single armrest and SPC  Timed up and go (TUG): 28.32 seconds with SPC and oxygen  GAIT: Assistive device utilized: Single point cane Level of assistance: Modified independence Comments: Decreased gait speed and stride length with poor foot clearance, heel strike, and toe off bilaterally                                                                                                                                 TREATMENT DATE:  02/27/24    EXERCISE LOG  Exercise Repetitions and Resistance Comments  Nustep Lvl 3 x 15 mins   Cybex Knee Flexion 30#  x 4 mins x 2    Cybex Knee Extension 10# x 4 mins   Rockerboard     LAQs    Seated Marches 3# x 25 reps bil   Seated Hip Adduction    Seated Hip Abduction Green x 4 mins   Seated Ham Curls    STS    Rows Yellow x 20 reps   Extensions Yellow x 20 reps    Blank cell = exercise not performed today                                    01/23/24 EXERCISE LOG  Exercise Repetitions and Resistance Comments  Nustep  L4 x 17 minutes   LAQ 3# x 3 minutes    Marching on foam  3 minutes  BUE support  Step up  6" step x 10 reps each  With BUE support  Seated marching 3# x 2.5 minutes    Blank cell = exercise not performed today                  01/21/24                   EXERCISE LOG  Exercise Repetitions and Resistance Comments  Nustep Lvl 1 x 10 mins   Rockerboard  (Seated) 3 mins   LAQs 2# x 20 reps bils   Seated Marches 2# x 15 reps bils   Seated Hip Adduction 3 mins   Seated Hip Abduction Red x 3 mins   Seated Ham Curls Red x 20 reps bil    Blank cell = exercise not performed today   PATIENT EDUCATION:  Education details: progress with physical therapy and benefits of exercise Person educated: Patient Education method: Explanation Education comprehension: verbalized understanding  HOME EXERCISE PROGRAM:   ASSESSMENT:  CLINICAL IMPRESSION: Pt arrives for today's treatment session denying any pain, but reports being fatigue and having issues catching her breath today.  Pt states that she called her pulmonologist after Monday's appointment and was referred to Urgent Care or the ED, which she both declined.  Pt continues to be limited by [facility] while on 5L O2.  Pt progress to this point has been limited due to recent shortness of breath and  general malaise.  Pt's SpO2 monitored throughout treatment session on regular 5L with pt able to maintain 90% or higher at all times.  Pt denied any pain at completion of today's treatment session.   OBJECTIVE IMPAIRMENTS: Abnormal gait, decreased activity tolerance, decreased balance, decreased mobility, difficulty walking, decreased ROM, decreased strength, and pain.   ACTIVITY LIMITATIONS: carrying, lifting, squatting, stairs, transfers, and locomotion level  PARTICIPATION LIMITATIONS: meal prep, cleaning, laundry, shopping, and community activity  PERSONAL FACTORS: Age, Past/current experiences, Time since onset of injury/illness/exacerbation, Transportation, and 3+ comorbidities: Hypertension, diabetes type 2, chronic diastolic heart failure, atrial fibrillation, arthritis, osteoporosis, anxiety, depression, and requires portable oxygen  are also affecting patient's functional outcome.   REHAB POTENTIAL: Fair    CLINICAL DECISION MAKING: Evolving/moderate complexity  EVALUATION COMPLEXITY: Moderate   GOALS: Goals reviewed with patient? Yes  SHORT TERM GOALS: Target date: 02/06/24 Patient will be independent with her initial HEP. Baseline: Goal status: MET  2.  Patient will improve her 5 times sit to stand time to 21 seconds or less for improved lower extremity power. Baseline: 2/10: 24.01 seconds; 3/5: 28.81 seconds Goal status: IN PROGRESS  3.  Patient will improve her timed up and go time to 21 seconds or less for improved functional mobility. Baseline: 2/10: 27.13;  3/5: 27.38 seconds Goal status: IN PROGRESS  LONG TERM GOALS: Target date: 02/27/24  Patient will be independent with her advanced HEP. Baseline:  Goal status: IN PROGRESS  2.  Patient will be able to transfer with minimal to no upper extremity support for improved independence. Baseline:  Goal status: IN PROGRESS  3.  Patient will improve her timed up and go time to 15 seconds or less for improved safety  and reduced fall risk. Baseline:  Goal status: IN PROGRESS  4.  Patient will improve her 5 times sit to stand time to 15 seconds or less to reduce her fall risk. Baseline:  Goal status: IN PROGRESS  PLAN:  PT FREQUENCY: 2x/week  PT DURATION: 6 weeks  PLANNED INTERVENTIONS: 97164- PT Re-evaluation, 97110-Therapeutic exercises, 97530- Therapeutic activity, 97112- Neuromuscular re-education, 97535- Self Care, 16109- Manual therapy, [id number]- Gait training, Patient/Family education, Balance training, Stair training, Joint mobilization, Cryotherapy, and Moist heat  PLAN FOR NEXT SESSION: Nustep, lower extremity strengthening, gait training, and manual therapy as needed   Newman Pies, PTA 02/27/2024, 4:10 PM

## 2024-02-28 ENCOUNTER — Ambulatory Visit: Payer: Self-pay | Admitting: Family Medicine

## 2024-02-28 DIAGNOSIS — I4811 Longstanding persistent atrial fibrillation: Secondary | ICD-10-CM | POA: Diagnosis not present

## 2024-02-28 DIAGNOSIS — J9611 Chronic respiratory failure with hypoxia: Secondary | ICD-10-CM | POA: Diagnosis not present

## 2024-02-28 DIAGNOSIS — K5730 Diverticulosis of large intestine without perforation or abscess without bleeding: Secondary | ICD-10-CM | POA: Diagnosis not present

## 2024-02-28 DIAGNOSIS — Z7901 Long term (current) use of anticoagulants: Secondary | ICD-10-CM | POA: Diagnosis not present

## 2024-02-28 DIAGNOSIS — R1314 Dysphagia, pharyngoesophageal phase: Secondary | ICD-10-CM | POA: Diagnosis not present

## 2024-02-28 DIAGNOSIS — I2510 Atherosclerotic heart disease of native coronary artery without angina pectoris: Secondary | ICD-10-CM | POA: Diagnosis not present

## 2024-02-28 DIAGNOSIS — J9811 Atelectasis: Secondary | ICD-10-CM | POA: Diagnosis not present

## 2024-02-28 DIAGNOSIS — R0602 Shortness of breath: Secondary | ICD-10-CM | POA: Diagnosis not present

## 2024-02-28 DIAGNOSIS — I10 Essential (primary) hypertension: Secondary | ICD-10-CM | POA: Diagnosis not present

## 2024-02-28 DIAGNOSIS — G4730 Sleep apnea, unspecified: Secondary | ICD-10-CM | POA: Diagnosis not present

## 2024-02-28 DIAGNOSIS — I272 Pulmonary hypertension, unspecified: Secondary | ICD-10-CM | POA: Diagnosis not present

## 2024-02-28 DIAGNOSIS — I517 Cardiomegaly: Secondary | ICD-10-CM | POA: Diagnosis not present

## 2024-02-28 DIAGNOSIS — I714 Abdominal aortic aneurysm, without rupture, unspecified: Secondary | ICD-10-CM | POA: Diagnosis not present

## 2024-02-28 DIAGNOSIS — G4733 Obstructive sleep apnea (adult) (pediatric): Secondary | ICD-10-CM | POA: Diagnosis not present

## 2024-02-28 DIAGNOSIS — Z888 Allergy status to other drugs, medicaments and biological substances status: Secondary | ICD-10-CM | POA: Diagnosis not present

## 2024-02-28 DIAGNOSIS — Z79899 Other long term (current) drug therapy: Secondary | ICD-10-CM | POA: Diagnosis not present

## 2024-02-28 DIAGNOSIS — R9431 Abnormal electrocardiogram [ECG] [EKG]: Secondary | ICD-10-CM | POA: Diagnosis not present

## 2024-02-28 DIAGNOSIS — E039 Hypothyroidism, unspecified: Secondary | ICD-10-CM | POA: Diagnosis not present

## 2024-02-28 DIAGNOSIS — I288 Other diseases of pulmonary vessels: Secondary | ICD-10-CM | POA: Diagnosis not present

## 2024-02-28 DIAGNOSIS — E1169 Type 2 diabetes mellitus with other specified complication: Secondary | ICD-10-CM | POA: Diagnosis not present

## 2024-02-28 DIAGNOSIS — J9621 Acute and chronic respiratory failure with hypoxia: Secondary | ICD-10-CM | POA: Diagnosis not present

## 2024-02-28 DIAGNOSIS — I509 Heart failure, unspecified: Secondary | ICD-10-CM | POA: Diagnosis not present

## 2024-02-28 DIAGNOSIS — I480 Paroxysmal atrial fibrillation: Secondary | ICD-10-CM | POA: Diagnosis not present

## 2024-02-28 DIAGNOSIS — E785 Hyperlipidemia, unspecified: Secondary | ICD-10-CM | POA: Diagnosis not present

## 2024-02-28 NOTE — Telephone Encounter (Signed)
 Chief Complaint: Difficulty breathing Symptoms: Fatigue, stomach pain/swelling Frequency: Constant Pertinent Negatives: Patient denies wheezing, coughing, dizziness, runny nose, chest pain, fever Disposition: [x] ED /[] Urgent Care (no appt availability in office) / [] Appointment(In office/virtual)/ []  [place] [place] [facility]/ [] Home Care/ [] Refused Recommended Disposition /[] [facility]/ []  Follow-up with PCP Additional Notes: Pt states for the past couple of days she "has no energy and can hardly breathe." Pt states she is on 3L of oxygen. Pt states she is normally on 2L but turned it up to 3L. Pt states her current O2 sat is 92%. Pt states she talked to her pulmonologist yesterday and he told her she needs to see her PCP. Pt states she has gained 5-6 pounds in the past couple weeks. Pt states her stomach hurts and there is swelling/itching right under her breasts. Pt states when she "eats something it feels like it goes partially down." This RN advised pt to go to ED. Pt states she cannot go as she is watching her grandbaby today and tomorrow. This RN emphasized to pt she really needs to go to ED. This RN offered to call EMS for pt but pt declined. This RN notified [facility] of pt ED refusal. This RN gave pt Care Advice.

## 2024-02-28 NOTE — Telephone Encounter (Signed)
 Copied from CRM [id number]. Topic: Clinical - Red Word Triage >> Feb 28, 2024  9:33 AM Carlatta H wrote: [facility] that prompted transfer to Nurse Triage: Patient is having trouble breathing and no energy for a few weeks//She is unable to move around due to not being about to breath// Reason for Disposition . [1] MODERATE difficulty breathing (e.g., speaks in phrases, SOB even at rest, pulse 100-120) AND [2] NEW-onset or WORSE than normal  Answer Assessment - Initial Assessment Questions 1. RESPIRATORY STATUS: "Describe your breathing?" (e.g., wheezing, shortness of breath, unable to speak, severe coughing)      If try to walk can barely stand up 2. ONSET: "When did this breathing problem begin?"      At least two weeks, getting worse 3. PATTERN "Does the difficult breathing come and go, or has it been constant since it started?"      Constant 4. OTHER SYMPTOMS: "Do you have any other symptoms? (e.g., dizziness, runny nose, cough, chest pain, fever)     Denies 5. O2 SATURATION MONITOR:  "Do you use an oxygen saturation monitor (pulse oximeter) at home?" If Yes, ask: "What is your reading (oxygen level) today?" "What is your usual oxygen saturation reading?" (e.g., 95%)       92%  Protocols used: Breathing Difficulty-A-AH

## 2024-02-28 NOTE — Telephone Encounter (Signed)
 Agent unable to transfer patient to me and then call dropped. Attempted to call patient back with no answer. Unable to leave voicemail.   Copied from CRM [id number]. Topic: Clinical - Red Word Triage >> Feb 28, 2024  9:33 AM Carlatta H wrote: [facility] that prompted transfer to Nurse Triage: Patient is having trouble breathing and no energy for a few weeks//She is unable to move around due to not being about to breath//

## 2024-02-29 ENCOUNTER — Ambulatory Visit: Payer: Self-pay | Admitting: Family Medicine

## 2024-02-29 NOTE — Telephone Encounter (Signed)
 Spoke to [place]. "The hospital was going to fax the information". Evaluated for weakness. They didn't find anything. They want PCP to follow up and him to find out what is going on.  She uses supplemental oxygen chronically, using 3 liters, 5 liters on portable machine. O2 is 93% Breathing is the same as when she was evaluated in the emergency room. Patient is not available for an appointment today or next week. Accepted follow up appointment on 03/12/24 with another provider.   Copied from CRM [id number]. Topic: Clinical - Medical Advice >> Feb 29, 2024  3:45 PM Ja-Kwan M wrote: Reason for CRM: Patient stated that she went to the emergency department yesterday and she was told to follow up with her doctor. Attempted to schedule patient for an appointment but she declined. Patient stated she does not know why she would need an appointment when everything was done at the emergency department. Patient requests to speak with a nurse. Call back# [phone] Reason for Disposition  [1] MILD longstanding difficulty breathing AND [2]  SAME as normal  Protocols used: Breathing Difficulty-A-AH

## 2024-03-01 ENCOUNTER — Other Ambulatory Visit: Payer: Self-pay | Admitting: Family Medicine

## 2024-03-01 DIAGNOSIS — E039 Hypothyroidism, unspecified: Secondary | ICD-10-CM

## 2024-03-03 ENCOUNTER — Telehealth: Payer: Self-pay

## 2024-03-03 ENCOUNTER — Ambulatory Visit

## 2024-03-03 DIAGNOSIS — R2681 Unsteadiness on feet: Secondary | ICD-10-CM | POA: Diagnosis not present

## 2024-03-03 DIAGNOSIS — M25561 Pain in right knee: Secondary | ICD-10-CM | POA: Diagnosis not present

## 2024-03-03 DIAGNOSIS — M25562 Pain in left knee: Secondary | ICD-10-CM | POA: Diagnosis not present

## 2024-03-03 DIAGNOSIS — G8929 Other chronic pain: Secondary | ICD-10-CM | POA: Diagnosis not present

## 2024-03-03 DIAGNOSIS — M545 Low back pain, unspecified: Secondary | ICD-10-CM

## 2024-03-03 NOTE — Telephone Encounter (Signed)
 Per Dr. Louanne Skye he received a call from PT office in [place] stating that during pts PT appt with them today pts O2 stat went to to 68%. They advised the pt to call our office today and/or make an appt. Pt stated that she has an appt coming up and would wait until then.  Called pt. No answer. Left message for informing pt to keep an eye on her )2 and if it was in the 80's or lower she needed to make [place] aware and come in sooner. Pt has a HFU with Dois Davenport on 3/19. Made pt aware of the appt on vmail.

## 2024-03-03 NOTE — Transitions of Care (Post Inpatient/ED Visit) (Signed)
 03/03/2024  Name: Danielle Parrish MRN: [id number] DOB: 1941-06-29  Today's TOC FU Call Status: Today's TOC FU Call Status:: Successful TOC FU Call Completed TOC FU Call Complete Date: 03/03/24 Patient's Name and Date of Birth confirmed.  Transition Care Management Follow-up Telephone Call Date of Discharge: 02/28/24 Discharge Facility: Other ([facility]) Name of Other ([facility]) Discharge Facility: [facility] Type of Discharge: Emergency Department Reason for ED Visit: Other: (hyperlipidema) How have you been since you were released from the hospital?: Same Any questions or concerns?: Yes  Items Reviewed: Did you receive and understand the discharge instructions provided?: Yes Medications obtained,verified, and reconciled?: Yes (Medications Reviewed) Any new allergies since your discharge?: No Dietary orders reviewed?: Yes Do you have support at home?: Yes People in Home: grandchild(ren)  Medications Reviewed Today: Medications Reviewed Today     Reviewed by Karena Addison, LPN (Licensed Practical Nurse) on 03/03/24 at 1710  Med List Status: <None>   Medication Order Taking? Sig Documenting Provider Last Dose Status Informant  acetaminophen (TYLENOL) 500 MG tablet [id number] No Take 1 tablet (500 mg total) by mouth every 6 (six) hours as needed. Daryll Drown, NP Taking Active   albuterol (VENTOLIN HFA) 108 (90 Base) MCG/ACT inhaler [id number] No Take 2 puffs eveery 6 hours as needed for wheezing or shortness of breath Dettinger, Elige Radon, MD Taking Active   amLODipine (NORVASC) 10 MG tablet [id number] No Take 1 tablet (10 mg total) by mouth daily. Dettinger, Elige Radon, MD Taking Active   apixaban (ELIQUIS) 5 MG TABS tablet [id number] No Take 1 tablet (5 mg total) by mouth 2 (two) times daily. Dettinger, Elige Radon, MD Taking Active   azelastine (ASTELIN) 0.1 % nasal spray [id number] No Place 1 spray into both nostrils 2 (two) times daily. Use in each nostril as directed  Dettinger, Elige Radon, MD Taking Active   calcium-vitamin D (OSCAL WITH D) 500-200 MG-UNIT TABS tablet [id number] No TAKE 1 TABLET BY MOUTH EVERY DAY WITH BREAKFAST Dettinger, Elige Radon, MD Taking Active   Cholecalciferol (VITAMIN D-3 PO) [id number] No Take 1 capsule by mouth daily.  [provider] Taking Active Self  diclofenac Sodium (VOLTAREN) 1 % GEL [id number] No APPLY 4 GRAMS TOPICALLY 4 (FOUR) TIMES DAILY. Dettinger, Elige Radon, MD Taking Active   diphenhydrAMINE (BENADRYL) 25 MG tablet [id number] No Take 50 mg by mouth at bedtime as needed for allergies.  [provider] Taking Active Self  Ferrous Sulfate (IRON) 325 (65 Fe) MG TABS [id number] No Take 1 tablet by mouth 3 (three) times daily. [provider] Taking Active   fluconazole (DIFLUCAN) 150 MG tablet [id number] No Take one tablet by mouth now. Repeat in 3 days if symptoms persist. Gabriel Earing, FNP Taking Active   furosemide (LASIX) 40 MG tablet [id number] No TAKE 1 TABLET BY MOUTH EVERY DAY Mallipeddi, Vishnu P, MD Taking Active   gabapentin (NEURONTIN) 300 MG capsule [id number] No Take 1 capsule (300 mg total) by mouth 3 (three) times daily. Dettinger, Elige Radon, MD Taking Active   ketoconazole (NIZORAL) 2 % cream [id number] No APPLY TO AFFECTED AREA EVERYDAY Raliegh Ip, DO Taking Active   levothyroxine (SYNTHROID) 100 MCG tablet [id number]  TAKE 1 TABLET BY MOUTH DAILY BEFORE BREAKFAST. Dettinger, Elige Radon, MD  Active   lidocaine (LIDODERM) 5 % [id number] No PLACE 1 PATCH ONTO THE SKIN DAILY. REMOVE & DISCARD PATCH WITHIN 12 HOURS OR AS DIRECTED BY MD Dettinger, Elige Radon, MD Taking Active   linaclotide [facility]) [id number]  MCG CAPS capsule [id number] No Take 145 mcg by mouth as needed. [provider] Taking Active   lovastatin (MEVACOR) 40 MG tablet [id number] No Take 1 tablet (40 mg total) by mouth at bedtime. Dettinger, Elige Radon, MD Taking Active   metoprolol succinate (TOPROL-XL) 25 MG 24 hr tablet  [id number] No Take 1 tablet (25 mg total) by mouth daily. Dettinger, Elige Radon, MD Taking Active   mirabegron ER (MYRBETRIQ) 25 MG TB24 tablet [id number] No Take 1 tablet (25 mg total) by mouth daily. Dettinger, Elige Radon, MD Taking Active   Multiple Vitamins-Minerals (PRESERVISION AREDS 2 PO) [id number] No Take by mouth. [provider] Taking Active   nystatin (MYCOSTATIN/NYSTOP) powder [id number] No APPLY TO AFFECTED AREA 4 TIMES A DAY Dettinger, Elige Radon, MD Taking Active   olmesartan (BENICAR) 20 MG tablet [id number] No Take 1 tablet (20 mg total) by mouth daily. Dettinger, Elige Radon, MD Taking Active   omeprazole (PRILOSEC) 20 MG capsule [id number] No Take 1 capsule (20 mg total) by mouth 2 (two) times daily before a meal.  Patient taking differently: Take 20 mg by mouth daily.   Dettinger, Elige Radon, MD Taking Active   predniSONE (DELTASONE) 10 MG tablet [id number] No Take 10 mg by mouth 2 (two) times daily with a meal. [provider] Taking Active   pyridOXINE (VITAMIN B-6) 100 MG tablet [id number] No Take 100 mg by mouth daily. [provider] Taking Active Self  sertraline (ZOLOFT) 100 MG tablet [id number]  TAKE 2 TABLETS BY MOUTH EVERY DAY Dettinger, Elige Radon, MD  Active   sodium chloride (OCEAN) 0.65 % SOLN nasal spray [id number] No Place 1 spray into both nostrils as needed for congestion. Elenora Gamma, MD Taking Active Self            Home Care and Equipment/Supplies: Were Home Health Services Ordered?: NA Any new equipment or medical supplies ordered?: NA  Functional Questionnaire: Do you need assistance with bathing/showering or dressing?: No Do you need assistance with meal preparation?: No Do you need assistance with eating?: No Do you have difficulty maintaining continence: No Do you need assistance with getting out of bed/getting out of a chair/moving?: No Do you have difficulty managing or taking your medications?: No  Follow up appointments  reviewed: PCP Follow-up appointment confirmed?: Yes Date of PCP follow-up appointment?: 03/12/24 Follow-up Provider: Purnell Shoemaker [facility] Follow-up appointment confirmed?: NA Do you need transportation to your follow-up appointment?: No Do you understand care options if your condition(s) worsen?: Yes-patient verbalized understanding    SIGNATURE Karena Addison, LPN [facility] Nurse Health Advisor Direct Dial [phone]

## 2024-03-03 NOTE — Therapy (Signed)
 OUTPATIENT PHYSICAL THERAPY LOWER EXTREMITY TREATMENT   Patient Name: Danielle Parrish MRN: [id number] DOB:11-16-1941, 83 y.o., female Today's Date: 03/03/2024  END OF SESSION:  PT End of Session - 03/03/24 1434     Visit Number 11    Number of Visits 12    Date for PT Re-Evaluation 03/21/24    PT Start Time 1430    PT Stop Time 1501    PT Time Calculation (min) 31 min    Equipment Utilized During Treatment Oxygen    Activity Tolerance Patient tolerated treatment well    Behavior During Therapy WFL for tasks assessed/performed              Past Medical History:  Diagnosis Date   Anxiety    Arthritis    CHF (congestive heart failure) (HCC)    Chronic pain of multiple joints    Depression    Essential hypertension    GERD (gastroesophageal reflux disease)    Hyperlipidemia    Hyperlipidemia associated with type 2 diabetes mellitus (HCC)    Hypertension associated with type 2 diabetes mellitus (HCC)    Hypothyroidism    Mild scoliosis 10/19/2021   Osteoporosis    Sleep apnea    CPAP   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, left 09/05/2023   left wrist   needs excision   Squamous cell carcinoma of skin 09/05/2023   left ear   needs mohs   Type 2 diabetes mellitus with hypercholesterolemia (HCC)    Vitamin D deficiency    Past Surgical History:  Procedure Laterality Date   ABDOMINAL HERNIA REPAIR     ABDOMINAL HYSTERECTOMY  1977   BREAST EXCISIONAL BIOPSY Right    EYE SURGERY  2020   bilateral cataract removal    KNEE ARTHROSCOPY Left 09/30/2014   Procedure: LEFT KNEE ARTHROSCOPY MEDIAL MENISECTOMY ABRASION CHONDROPLASTY SYNOVECTOMY SUPRAPATELLER CUFF;  Surgeon: Jacki Cones, MD;  Location: [facility];  Service: Orthopedics;  Laterality: Left;   Patient Active Problem List   Diagnosis Date Noted   Chronic anticoagulation 09/07/2023   Paroxysmal atrial fibrillation (HCC) 06/19/2023   Hypercoagulable state due to paroxysmal atrial fibrillation (HCC)  06/19/2023   Chronic respiratory failure with hypoxia (HCC) 07/10/2022   Chronic midline low back pain without sciatica 01/30/2019   Age-related osteoporosis with current pathol fracture of vertebra (HCC) 10/28/2018   Calcification of aorta (HCC) 10/02/2018   Compression fracture of T12 vertebra (HCC) 10/01/2018   Primary osteoarthritis of right knee 07/20/2017   Varicose veins of left lower extremity with complications 07/31/2016   OSA (obstructive sleep apnea) 09/14/2015   Chronic diastolic heart failure (HCC) 09/10/2015   Dysphagia, pharyngoesophageal phase 08/04/2015   Anorexia 08/04/2015   Malaise and fatigue 08/04/2015   Depression, recurrent (HCC) 04/08/2015   Vitamin D deficiency    Anxiety    GERD (gastroesophageal reflux disease)    Arthritis    Hypertension associated with type 2 diabetes mellitus (HCC) 05/15/2013   Hyperlipidemia associated with type 2 diabetes mellitus (HCC) 05/15/2013   Type 2 diabetes mellitus with hypercholesterolemia (HCC) 05/15/2013   Morbid obesity (HCC) 05/15/2013   Hypothyroidism 05/15/2013    PCP: Dettinger, Elige Radon, MD  REFERRING PROVIDER: Ranee Gosselin, MD   REFERRING DIAG: Right knee pain, Left knee pain   THERAPY DIAG:  Unsteadiness on feet  Chronic pain of right knee  Chronic pain of left knee  Chronic bilateral low back pain without sciatica  Rationale for Evaluation and Treatment: Rehabilitation  ONSET DATE: "years ago"   SUBJECTIVE:   SUBJECTIVE STATEMENT: Pt denies any pain, but reports SHOB and abdominal swelling. Pt went to the ED on Thursday with MD not able to find any cause to fatigue, SHOB, or abdominal swelling. Pt has appt with PCP 03/12/24.    PERTINENT HISTORY: Hypertension, diabetes type 2, chronic diastolic heart failure, atrial fibrillation, arthritis, osteoporosis, anxiety, depression, and requires portable oxygen PAIN:  Are you having pain? No  PRECAUTIONS: None  RED FLAGS: None   WEIGHT  BEARING RESTRICTIONS: No  FALLS:  Has patient fallen in last 6 months? No  LIVING ENVIRONMENT: Lives with: lives with their family Lives in: House/apartment Stairs: Yes: Internal: 20 steps; on right going up; step to pattern Has following equipment at home: Single point cane (patient only uses the cane when she is away from home)   OCCUPATION: retired  PLOF: Independent  PATIENT GOALS: walk better  NEXT MD VISIT: none scheduled  OBJECTIVE:  Note: Objective measures were completed at Evaluation unless otherwise noted.  COGNITION: Overall cognitive status: Within functional limits for tasks assessed     SENSATION: Patient reports no numbness or tingling  EDEMA:  No significant edema observed  POSTURE: rounded shoulders, forward head, decreased lumbar lordosis, and increased thoracic kyphosis  PALPATION: No tenderness to palpation reported  LOWER EXTREMITY ROM: assessed in sitting  Active ROM Right eval Left eval  Hip flexion    Hip extension    Hip abduction    Hip adduction    Hip internal rotation    Hip external rotation    Knee flexion 117 107  Knee extension 6 11  Ankle dorsiflexion    Ankle plantarflexion    Ankle inversion    Ankle eversion     (Blank rows = not tested)  LOWER EXTREMITY MMT:  MMT Right eval Left eval  Hip flexion 4-/5 4+/5  Hip extension    Hip abduction    Hip adduction    Hip internal rotation    Hip external rotation    Knee flexion 4+/5 4+/5  Knee extension 4+/5 5/5  Ankle dorsiflexion 4/5 4/5  Ankle plantarflexion    Ankle inversion    Ankle eversion     (Blank rows = not tested)  FUNCTIONAL TESTS:  5 times sit to stand: 28.17 seconds with single armrest and SPC  Timed up and go (TUG): 28.32 seconds with SPC and oxygen  GAIT: Assistive device utilized: Single point cane Level of assistance: Modified independence Comments: Decreased gait speed and stride length with poor foot clearance, heel strike, and toe off  bilaterally                                                                                                                                TREATMENT DATE:  03/03/24    EXERCISE LOG  Exercise Repetitions and Resistance Comments  Nustep Lvl 3 x 15 mins   Cybex Knee  Flexion    Cybex Knee Extension    Rockerboard     LAQs    Seated Marches    Seated Hip Adduction    Seated Hip Abduction    Seated Ham Curls    STS    Rows    Extensions     Blank cell = exercise not performed today                                    01/23/24 EXERCISE LOG  Exercise Repetitions and Resistance Comments  Nustep  L4 x 17 minutes   LAQ 3# x 3 minutes    Marching on foam  3 minutes  BUE support  Step up  6" step x 10 reps each  With BUE support  Seated marching 3# x 2.5 minutes    Blank cell = exercise not performed today                  01/21/24                   EXERCISE LOG  Exercise Repetitions and Resistance Comments  Nustep Lvl 1 x 10 mins   Rockerboard  (Seated) 3 mins   LAQs 2# x 20 reps bils   Seated Marches 2# x 15 reps bils   Seated Hip Adduction 3 mins   Seated Hip Abduction Red x 3 mins   Seated Ham Curls Red x 20 reps bil    Blank cell = exercise not performed today   PATIENT EDUCATION:  Education details: progress with physical therapy and benefits of exercise Person educated: Patient Education method: Explanation Education comprehension: verbalized understanding  HOME EXERCISE PROGRAM:   ASSESSMENT:  CLINICAL IMPRESSION: Pt arrives for today's treatment session denying any pain, but continues to report SHOB, fatigue, and now abdominal swelling.  Pt states that she went to ED on Thursday where they were unable to find any cause to above symptoms.  While performing Nustep today, pt's SpO2 decreased to 73% on 5L of portable O2.  With 60 secs of rest, pt's SpO2 able to recover to 95% on baseline O2.  Pt adamant that she complete her time on the Nustep with rest breaks despite her  drops in SpO2.  Pt educated on the importance of closely monitoring her SpO2 levels at home with activity as well as PLB.  Pt encouraged to contact PCP with possibility of moving appointment to as soon as possible.  This PTA contacted pt's PCP about concerns with pt's SHOB.  Due to [facility] and fatigue today's treatment session terminated early.  PTA accompanied pt to her vehicle at completion of session.  Pt denied any pain at completion of today's treatment session.  OBJECTIVE IMPAIRMENTS: Abnormal gait, decreased activity tolerance, decreased balance, decreased mobility, difficulty walking, decreased ROM, decreased strength, and pain.   ACTIVITY LIMITATIONS: carrying, lifting, squatting, stairs, transfers, and locomotion level  PARTICIPATION LIMITATIONS: meal prep, cleaning, laundry, shopping, and community activity  PERSONAL FACTORS: Age, Past/current experiences, Time since onset of injury/illness/exacerbation, Transportation, and 3+ comorbidities: Hypertension, diabetes type 2, chronic diastolic heart failure, atrial fibrillation, arthritis, osteoporosis, anxiety, depression, and requires portable oxygen  are also affecting patient's functional outcome.   REHAB POTENTIAL: Fair    CLINICAL DECISION MAKING: Evolving/moderate complexity  EVALUATION COMPLEXITY: Moderate   GOALS: Goals reviewed with patient? Yes  SHORT TERM GOALS: Target date: 02/06/24 Patient will be independent with  her initial HEP. Baseline: Goal status: MET  2.  Patient will improve her 5 times sit to stand time to 21 seconds or less for improved lower extremity power. Baseline: 2/10: 24.01 seconds; 3/5: 28.81 seconds Goal status: IN PROGRESS  3.  Patient will improve her timed up and go time to 21 seconds or less for improved functional mobility. Baseline: 2/10: 27.13;  3/5: 27.38 seconds Goal status: IN PROGRESS  LONG TERM GOALS: Target date: 02/27/24  Patient will be independent with her advanced HEP. Baseline:   Goal status: IN PROGRESS  2.  Patient will be able to transfer with minimal to no upper extremity support for improved independence. Baseline:  Goal status: IN PROGRESS  3.  Patient will improve her timed up and go time to 15 seconds or less for improved safety and reduced fall risk. Baseline:  Goal status: IN PROGRESS  4.  Patient will improve her 5 times sit to stand time to 15 seconds or less to reduce her fall risk. Baseline:  Goal status: IN PROGRESS  PLAN:  PT FREQUENCY: 2x/week  PT DURATION: 6 weeks  PLANNED INTERVENTIONS: 97164- PT Re-evaluation, 97110-Therapeutic exercises, 97530- Therapeutic activity, 97112- Neuromuscular re-education, 97535- Self Care, 16109- Manual therapy, [id number]- Gait training, Patient/Family education, Balance training, Stair training, Joint mobilization, Cryotherapy, and Moist heat  PLAN FOR NEXT SESSION: Nustep, lower extremity strengthening, gait training, and manual therapy as needed   Newman Pies, PTA 03/03/2024, 3:32 PM

## 2024-03-05 ENCOUNTER — Other Ambulatory Visit: Payer: Self-pay

## 2024-03-05 ENCOUNTER — Encounter

## 2024-03-06 ENCOUNTER — Telehealth: Payer: Self-pay | Admitting: Internal Medicine

## 2024-03-06 ENCOUNTER — Encounter: Payer: Self-pay | Admitting: Internal Medicine

## 2024-03-06 ENCOUNTER — Ambulatory Visit: Admitting: Internal Medicine

## 2024-03-06 VITALS — BP 136/86 | HR 71 | Ht 62.0 in | Wt 188.8 lb

## 2024-03-06 DIAGNOSIS — J9611 Chronic respiratory failure with hypoxia: Secondary | ICD-10-CM

## 2024-03-06 DIAGNOSIS — R0609 Other forms of dyspnea: Secondary | ICD-10-CM

## 2024-03-06 NOTE — Assessment & Plan Note (Signed)
 Desat  on ambulation 07/04/2022  > d/c'd macrodantin  - 07/04/2022   Walked on RA  x  2  lap(s) =  approx 500  ft  @ slow pace, stopped due to desats/sob with lowest 02 sats 80% > rec use portable 02 to maintain > 90% (already had it at time of ov but didn't bring it with her)    - 08/22/2022   Walked on RA  x  2  lap(s) =  approx 300  ft  @ slow/cane pace, stopped due to knee pain> sob  with lowest 02 sats 88%   - 11/14/2022 no longer desats walking as of  11/14/2022  - 02/06/2023 room air 150 ft walking > SOB with sats down to 84% - placed patient on 2LO2 cont and sats increased to 92% walking  - sats on RA 03/09/2023 on arrival > 03/09/2023   Walked on 2lpm  x  1  lap(s) =  approx 150  ft  @ slow pace, stopped due to sob  with lowest 02 sats 92%   - CTa  02/28/24 SSA, no emboli or ILD  - 03/06/2024   Walked on 4lpm continuous   150 ft very slow pace lowest sats 94%   Clearly not getting enough 02 for exertion from POC but overall limited by obesity and decondtioning   Agree with PT but need to make sure sats > 90%   Will refer to best fit for portable 02

## 2024-03-06 NOTE — Progress Notes (Unsigned)
 Established Patient Office Visit  Subjective  Patient ID: Danielle Parrish, female    DOB: 1941-04-24  Age: 83 y.o. MRN: [id number]  Chief Complaint  Patient presents with   Hospitalization Follow-up    Went to hospital 3/6 for difficulty breathing and pain     HPI Danielle Parrish is a 83 y.o presents 03/12/2024 for hospital d/c follow. She admitted for dyspnea. PMH high blood pressure, CHF, obstructive sleep apnea, atrial fibrillation, hyperlipidemia,  chronic hypoxic respiratory failure w/ use continuous oxygen, DM2 Hypothyroidism.  Danielle Parrish reports dealing with excessive tiredness since her hospital d/c and has to increase her portable oxygen to 5 L, but still her home concentrator at 3 L " I get tired easily, now I am not able to walk to the mail box, I have to take multiple break. I am very active person". Reports oxygen saturation has been  fluctuating around 87-94 at time. Wear Cpap at night. She will follow with pulmonology  Afib: Has hx of Afib current on Eliquis.She has been mostly in NS during her hospital stay. Had echo done that show changes from last ECHO 2 yrs echo ago see below ECHO 03/11/2024  1.Left ventricular ejection fraction, by estimation, is 55 to 60%. Left ventricular ejection fraction by 3D volume is 56 %. The left ventricle has normal function. Left ventricular endocardial border not optimally defined to evaluate regional wall motion. Left ventricular diastolic parameters are consistent with Grade I diastolic dysfunction (impaired relaxation). The average left ventricular global longitudinal strain is -14.3 %.  2.Right ventricular systolic function is normal. The right ventricular size is normal. Tricuspid regurgitation signal is inadequate for assessing PA pressure.  3. The mitral valve is normal in structure. No evidence of mitral valve regurgitation. No evidence of mitral stenosis.  4. The aortic valve has an indeterminant number of cusps. Aortic valve regurgitation is  not visualized. Mild aortic valve stenosis. Aortic valve mean gradient measures 8.0 mmHg. Aortic valve Vmax measures 2.02 m/s.  5. The inferior vena cava is normal in size with greater than 50% respiratory variability, suggesting right atrial pressure of 3 mmHg.  6. Increased flow velocities may be secondary to anemia, thyrotoxicosis, hyperdynamic or high flow state  Echo 06/2022:  1. Left ventricular ejection fraction, by estimation, is 60 to 65%. The left ventricle has normal function. The left ventricle has no regional wall motion abnormalities. There is mild left ventricular hypertrophy. Left ventricular diastolic parameters are consistent with Grade I diastolic dysfunction (impaired relaxation). The average left ventricular global longitudinal strain is -19.2 %. The  global longitudinal strain is normal.  2. Right ventricular systolic function is low normal. The right ventricular size is normal. There is normal pulmonary artery systolic pressure. The estimated right ventricular systolic pressure is 7.2 mmHg.  3. The mitral valve is degenerative. Trivial mitral valve regurgitation.  4. The aortic valve is tricuspid. There is mild calcification of the aortic valve. Aortic valve regurgitation is mild. Mild aortic valve stenosis. Aortic regurgitation PHT measures 1034 msec. Aortic valve area, by VTI measures 1.86 cm. Aortic valve mean gradient measures 9.0 mmHg.  5. The inferior vena cava is normal in size with greater than 50% respiratory variability, suggesting right atrial pressure of 3 mmHg    Depression She has hx of depression current being managed with 200 mg of Prozac  She reports excellent compliance with treatment. She  having side effects.  She reports excellent tolerance of treatment. Current symptoms include: anhedonia, depressed mood, difficulty  concentrating, fatigue, hopelessness, and insomnia She feels she is Worse since last visit. She is receptive to trying Rexulti. Reports SI  with no plan     03/12/2024    8:59 AM 01/24/2024    2:20 PM 11/01/2023    9:04 AM  Depression screen PHQ 2/9  Decreased Interest 3 2 2   Down, Depressed, Hopeless 1 2 2   PHQ - 2 Score 4 4 4   Altered sleeping 2 3 2   Tired, decreased energy 3 3 2   Change in appetite 2 3 2   Feeling bad or failure about yourself  2 2 2   Trouble concentrating 2 0 2  Moving slowly or fidgety/restless 2 0 1  Suicidal thoughts 1 0 0  PHQ-9 Score 18 15 15   Difficult doing work/chores Somewhat difficult Not difficult at all Somewhat difficult       03/12/2024    8:59 AM 01/24/2024    2:20 PM 11/01/2023    9:04 AM  PHQ9 SCORE ONLY  PHQ-9 Total Score 18 15 15        03/12/2024    9:00 AM 01/24/2024    2:21 PM 11/01/2023    9:03 AM 10/03/2023   11:31 AM  GAD 7 : Generalized Anxiety Score  Nervous, Anxious, on Edge 3 0 2 3  Control/stop worrying 3 3 2 3   Worry too much - different things 3 3 2 3   Trouble relaxing 3 1 2 3   Restless 3 1 2 3   Easily annoyed or irritable 1 1 2 3   Afraid - awful might happen 2 2 2 3   Total GAD 7 Score 18 11 14 21   Anxiety Difficulty Somewhat difficult Not difficult at all Not difficult at all Not difficult at all     Patient Active Problem List   Diagnosis Date Noted   Hospital discharge follow-up 03/13/2024   Pulmonary hypertension, primary (HCC) 03/13/2024   Chronic anticoagulation 09/07/2023   Paroxysmal atrial fibrillation (HCC) 06/19/2023   Hypercoagulable state due to paroxysmal atrial fibrillation (HCC) 06/19/2023   Chronic respiratory failure with hypoxia (HCC) 07/10/2022   Shortness of breath 07/04/2022   Chronic midline low back pain without sciatica 01/30/2019   Age-related osteoporosis with current pathol fracture of vertebra (HCC) 84/03/2018   Calcification of aorta (HCC) 10/02/2018   Compression fracture of T12 vertebra (HCC) 10/01/2018   Primary osteoarthritis of right knee 07/20/2017   Varicose veins of left lower extremity with complications  07/31/2016   OSA (obstructive sleep apnea) 09/14/2015   Chronic diastolic heart failure (HCC) 09/10/2015   Dysphagia, pharyngoesophageal phase 08/04/2015   Anorexia 08/04/2015   Malaise and fatigue 08/04/2015   Depression, recurrent (HCC) 04/08/2015   Vitamin D deficiency    Anxiety    GERD (gastroesophageal reflux disease)    Arthritis    Hypertension associated with type 2 diabetes mellitus (HCC) 05/15/2013   Hyperlipidemia associated with type 2 diabetes mellitus (HCC) 05/15/2013   Type 2 diabetes mellitus with hypercholesterolemia (HCC) 05/15/2013   Morbid obesity (HCC) 05/15/2013   Hypothyroidism 05/15/2013   Past Medical History:  Diagnosis Date   Anxiety    Arthritis    CHF (congestive heart failure) (HCC)    Chronic pain of multiple joints    Depression    Essential hypertension    GERD (gastroesophageal reflux disease)    Hyperlipidemia    Hyperlipidemia associated with type 2 diabetes mellitus (HCC)    Hypertension associated with type 2 diabetes mellitus (HCC)    Hypothyroidism  Mild scoliosis 10/19/2021   Osteoporosis    Sleep apnea    CPAP   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, left 09/05/2023   left wrist   needs excision   Squamous cell carcinoma of skin 09/05/2023   left ear   needs mohs   Type 2 diabetes mellitus with hypercholesterolemia (HCC)    Vitamin D deficiency    Past Surgical History:  Procedure Laterality Date   ABDOMINAL HERNIA REPAIR     ABDOMINAL HYSTERECTOMY  1977   BREAST EXCISIONAL BIOPSY Right    EYE SURGERY  2020   bilateral cataract removal    KNEE ARTHROSCOPY Left 09/30/2014   Procedure: LEFT KNEE ARTHROSCOPY MEDIAL MENISECTOMY ABRASION CHONDROPLASTY SYNOVECTOMY SUPRAPATELLER CUFF;  Surgeon: Jacki Cones, MD;  Location: [facility];  Service: Orthopedics;  Laterality: Left;   Social History   Tobacco Use   Smoking status: Never    Passive exposure: Past   Smokeless tobacco: Never  Vaping Use   Vaping status: Never Used   Substance Use Topics   Alcohol use: No   Drug use: No   Social History   Socioeconomic History   Marital status: Widowed    Spouse name: Chanetta Marshall   Number of children: 2   Years of education: Not on file   Highest education level: High school graduate  Occupational History   Occupation: CNA    Comment: [employer]   Occupation: retired  Tobacco Use   Smoking status: Never    Passive exposure: Past   Smokeless tobacco: Never  Vaping Use   Vaping status: Never Used  Substance and Sexual Activity   Alcohol use: No   Drug use: No   Sexual activity: Not Currently  Other Topics Concern   Not on file  Social History Narrative   Lives with her daughter   Not very active socially or physically   Social Drivers of Health   Financial Resource Strain: Low Risk  (08/15/2023)   Overall Financial Resource Strain (CARDIA)    Difficulty of Paying Living Expenses: Not hard at all  Food Insecurity: No Food Insecurity (08/15/2023)   Hunger Vital Sign    Worried About Running Out of Food in the Last Year: Never true    Ran Out of Food in the Last Year: Never true  Transportation Needs: No Transportation Needs (08/15/2023)   PRAPARE - Administrator, Civil Service (Medical): No    Lack of Transportation (Non-Medical): No  Physical Activity: Inactive (08/15/2023)   Exercise Vital Sign    Days of Exercise per Week: 0 days    Minutes of Exercise per Session: 0 min  Stress: No Stress Concern Present (08/15/2023)   [employer] of Occupational Health - Occupational Stress Questionnaire    Feeling of Stress : Not at all  Social Connections: Socially Isolated (08/15/2023)   Social Connection and Isolation Panel [NHANES]    Frequency of Communication with Friends and Family: More than three times a week    Frequency of Social Gatherings with Friends and Family: More than three times a week    Attends Religious Services: Never    Database administrator or Organizations: No     Attends Banker Meetings: Never    Marital Status: Widowed  Intimate Partner Violence: Not At Risk (02/28/2024)   [facility]   HITS    Over the last 12 months how often did your partner physically hurt you?: Never  Over the last 12 months how often did your partner insult you or talk down to you?: Never    Over the last 12 months how often did your partner threaten you with physical harm?: Never    Over the last 12 months how often did your partner scream or curse at you?: Never   Family Status  Relation Name Status   Mother  Alive   Father  Deceased   G Son  Alive   Daughter  Alive  No partnership data on file   Family History  Problem Relation Age of Onset   Hypertension Mother    Stroke Mother    Cancer Father    Sleep apnea Grandson    Sleep apnea Daughter    Allergies  Allergen Reactions   Nitrofurantoin Shortness Of Breath    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    Penicillins Hives and Other (See Comments)    Has patient had a PCN reaction causing immediate rash, facial/tongue/throat swelling, SOB or lightheadedness with hypotension: Yes  Has patient had a PCN reaction causing severe rash involving mucus membranes or skin necrosis: unknown  Has patient had a PCN reaction that required hospitalization No  Has patient had a PCN reaction occurring within the last 10 years: No  If all of the above answers are "NO", then may proceed with Cephalosporin use.   Rosuvastatin Other (See Comments)    Body aches.   Statins Other (See Comments)   Vytorin [Ezetimibe-Simvastatin]     Body aches.    Aspirin Nausea Only   Tolmetin Nausea Only    Burns stomach.       Review of Systems  Constitutional:  Negative for chills and fever.  HENT:  Negative for sore throat.   Respiratory:  Positive for shortness of breath. Negative for cough.        5 L O2  Cardiovascular:  Negative for chest pain and leg  swelling.  Gastrointestinal:  Negative for constipation, diarrhea, nausea and vomiting.  Skin:  Negative for itching and rash.  Neurological:  Negative for dizziness and headaches.  Psychiatric/Behavioral:  Positive for suicidal ideas. The patient is nervous/anxious.        No plan " I am just tired with everything going on with my health"   Negative unless indicated in HPI   Objective:     BP 130/72   Pulse 63   Temp 98 F (36.7 C) (Temporal)   Ht 5\' 2"  (1.575 m)   Wt 191 lb 9.6 oz (86.9 kg)   SpO2 98%   BMI 35.04 kg/m  BP Readings from Last 3 Encounters:  03/12/24 130/72  03/06/24 136/86  02/20/24 106/60   Wt Readings from Last 3 Encounters:  03/12/24 191 lb 9.6 oz (86.9 kg)  03/06/24 188 lb 12.8 oz (85.6 kg)  02/18/24 189 lb (85.7 kg)      Physical Exam Vitals and nursing note reviewed.  Constitutional:      Appearance: She is obese.  HENT:     Head: Normocephalic and atraumatic.     Nose: Nose normal.     Mouth/Throat:     Mouth: Mucous membranes are moist.  Eyes:     Extraocular Movements: Extraocular movements intact.     Conjunctiva/sclera: Conjunctivae normal.     Pupils: Pupils are equal, round, and reactive to light.  Cardiovascular:     Heart sounds: Normal heart sounds.  Pulmonary:  Effort: Pulmonary effort is normal.     Breath sounds: Normal breath sounds.     Comments: 5 L O2 Abdominal:     General: Bowel sounds are normal.     Palpations: Abdomen is soft.  Musculoskeletal:        General: Normal range of motion.     Right lower leg: No edema.     Left lower leg: No edema.  Skin:    General: Skin is warm and dry.     Findings: No rash.  Neurological:     Mental Status: She is alert and oriented to person, place, and time.  Psychiatric:        Attention and Perception: Attention and perception normal.        Mood and Affect: Mood is anxious. Affect is tearful.        Speech: Speech normal.        Behavior: Behavior normal. Behavior  is cooperative.        Thought Content: Thought content includes suicidal ideation. Thought content does not include homicidal ideation. Thought content does not include homicidal or suicidal plan.        Cognition and Memory: Cognition and memory normal.        Judgment: Judgment normal.      No results found for any visits on 03/12/24.  Last CBC Lab Results  Component Value Date   WBC 6.1 01/24/2024   HGB 14.3 01/24/2024   HCT 42.9 01/24/2024   MCV 98 (H) 01/24/2024   MCH 32.6 01/24/2024   RDW 11.8 01/24/2024   PLT 212 01/24/2024   Last metabolic panel Lab Results  Component Value Date   GLUCOSE 84 01/24/2024   NA 137 01/24/2024   K 4.3 01/24/2024   CL 95 (L) 01/24/2024   CO2 26 01/24/2024   BUN 13 01/24/2024   CREATININE 0.68 01/24/2024   EGFR 86 01/24/2024   CALCIUM 10.6 (H) 01/24/2024   PROT 6.7 10/03/2023   ALBUMIN 4.5 10/03/2023   LABGLOB 2.2 10/03/2023   AGRATIO 2.2 09/20/2022   BILITOT 0.3 10/03/2023   ALKPHOS 67 10/03/2023   AST 18 10/03/2023   ALT 20 10/03/2023   ANIONGAP 8 06/11/2023   Last lipids Lab Results  Component Value Date   CHOL 188 01/24/2024   HDL 76 01/24/2024   LDLCALC 83 01/24/2024   TRIG 172 (H) 01/24/2024   CHOLHDL 2.5 01/24/2024   Last hemoglobin A1c Lab Results  Component Value Date   HGBA1C 5.9 (H) 01/24/2024   Last thyroid functions Lab Results  Component Value Date   TSH 1.130 01/24/2024   T4TOTAL 6.6 09/14/2023        Assessment & Plan:  Shortness of breath  Depression, recurrent (HCC) -     Brexpiprazole; Take 1 tablet (0.25 mg total) by mouth daily.  Dispense: 30 tablet; Refill: 1  Hospital discharge follow-up   Danielle Parrish is an 83 year old Caucasian female seen today posthospital discharge follow-up, no acute distress Depression: Continue Prozac 200 mg daily add Rexulti 0.25 mg daily Continue all prescribed medications Follow-up with pulmonology as already scheduled  Client is scheduled to see PCP in 2  weeks Encourage healthy lifestyle choices, including diet (rich in fruits, vegetables, and lean proteins, and low in salt and simple carbohydrates) and exercise (at least 30 minutes of moderate physical activity daily).     The above assessment and management plan was discussed with the patient. The patient verbalized understanding of and has agreed to the  management plan. Patient is aware to call the clinic if they develop any new symptoms or if symptoms persist or worsen. Patient is aware when to return to the clinic for a follow-up visit. Patient educated on when it is appropriate to go to the emergency department.  Return for PCP in 2 weeks.    Arrie Aran Santa Lighter, [facility] [address] [place], [place] [address] [phone]    Note: This document was prepared by [employer] voice dictation technology and any errors that results from this process are unintentional.

## 2024-03-06 NOTE — Progress Notes (Signed)
 Danielle Parrish, female    DOB: 05-19-1941   MRN: [id number]   Brief patient profile:  19 yowf never smoker  referred to pulmonary clinic 07/04/2022 by Dr Danielle Parrish  for cough p shoulder injury requiring GA and surgery in Aug of 2022.  [employer] still able to do groceries leaning on cart / mb and back flat x 75 ft with walker   History of Present Illness  07/04/2022  Pulmonary/ 1st office eval/Danielle Parrish on Macrodantin Chief Complaint  Patient presents with   Consult    Pt states she has some SOB and a runny nose x1 year. Pt states she has Oxygen and CPAP machine but she does not use the CPAP machine.   Dyspnea:  mb and back slowly = MMRC3 = can't walk 100 yards even at a slow pace at a flat grade s stopping due to sob   Cough: voice fatigue  Sleep: cpap/02  SABA use: minimal better    Rec You will need to get the echocardiogram asap - call this office if can't schedule it within  a week  Stop macrodantin  Please schedule a follow up office visit in 3- 4 weeks, sooner if needed  - [facility]  - bring all medications Late add: pt instructed on use of amb 02 = goal is to keep > 90% saturations at all times     08/22/2022  f/u ov/[place] office/Danielle Parrish re: doe maint on ?  (Did not bring meds)   Chief Complaint  Patient presents with   Follow-up    Feels breathing is not doing well. Knees are bothering patient today.   Dyspnea:  walking mailbox and back but not checking sats despite "feeling breathing worse"  Cough: none but does have mild globus sensation on ACEi Sleeping: cpap/02 sleeping 0k SABA use: not really helping sob  02: not using x during the noct  Rec Stop lisinopril  and start olmesartan 20 mg on daily in its place to see if helps breathing Amlodipine may be making your swell up and need to reduce the dose if blood pressure too low  Make sure you check your oxygen saturation  AT  your highest level of activity (not after you stop)   to be sure it stays over  90%        03/09/2023  f/u ov/[place] office/Danielle Parrish re: doe/ AIAR  maint on prn saba    Chief Complaint  Patient presents with   Follow-up    Pt f/u she states that she is having difficulty walking, SOB and low oxygen sats  Dyspnea:  more limited by R hip than breathing/ mb and back on RA does not check 02 while walking there also doing steps 10 x daily stops at top but not using 02 then either or monitoring sats Cough: none  Sleeping: lost remote, bed flat / 2 pillows = baseline  SABA use: albuterol once a day 02: 2lpm and cpap fine  Rec Make sure you check your oxygen saturation  AT  your highest level of activity (not after you stop)   to be sure it stays over 90%   We need to work on keeping your blood pressure lower due to your valvular heart problem     03/06/2024 ACUTE ov/[place] office/Danielle Parrish re: doe/valvular heart dz maint on 02  Cc: just tired  Dyspnea:  room to room doe /  twice weekly  PT with desats reported on POC  Cough: not a problem  Sleeping: bed is flat/ lots of pillows s resp cc  on cpap per neuro > tired on awakening  SABA use: none 02: 3lpm in cpap/ 5lp POC during the day     No obvious day to day or daytime variability or assoc excess/ purulent sputum or mucus plugs or hemoptysis or cp or chest tightness, subjective wheeze or overt sinus or hb symptoms.    Also denies any obvious fluctuation of symptoms with weather or environmental changes or other aggravating or alleviating factors except as outlined above   No unusual exposure hx or h/o childhood pna/ asthma or knowledge of premature birth.  Current Allergies, Complete Past Medical History, Past Surgical History, Family History, and Social History were reviewed in [employer] record.  ROS  The following are not active complaints unless bolded Hoarseness, sore throat, dysphagia, dental problems, itching, sneezing,  nasal congestion or discharge of excess mucus or purulent  secretions, ear ache,   fever, chills, sweats, unintended wt loss or wt gain, classically pleuritic or exertional cp,  orthopnea pnd or arm/hand swelling  or leg swelling, presyncope, palpitations, abdominal pain, anorexia, nausea, vomiting, diarrhea  or change in bowel habits or change in bladder habits, change in stools or change in urine, dysuria, hematuria,  rash, arthralgias, visual complaints, headache, numbness, weakness or ataxia or problems with walking or coordination,  change in mood or  memory.        Current Meds - - NOTE:   Unable to verify as accurately reflecting what pt takes    Medication Sig   acetaminophen (TYLENOL) 500 MG tablet Take 1 tablet (500 mg total) by mouth every 6 (six) hours as needed.   albuterol (VENTOLIN HFA) 108 (90 Base) MCG/ACT inhaler Take 2 puffs eveery 6 hours as needed for wheezing or shortness of breath   amLODipine (NORVASC) 10 MG tablet Take 1 tablet (10 mg total) by mouth daily.   apixaban (ELIQUIS) 5 MG TABS tablet Take 1 tablet (5 mg total) by mouth 2 (two) times daily.   azelastine (ASTELIN) 0.1 % nasal spray Place 1 spray into both nostrils 2 (two) times daily. Use in each nostril as directed   calcium-vitamin D (OSCAL WITH D) 500-200 MG-UNIT TABS tablet TAKE 1 TABLET BY MOUTH EVERY DAY WITH BREAKFAST   Cholecalciferol (VITAMIN D-3 PO) Take 1 capsule by mouth daily.    diclofenac Sodium (VOLTAREN) 1 % GEL APPLY 4 GRAMS TOPICALLY 4 (FOUR) TIMES DAILY.   diphenhydrAMINE (BENADRYL) 25 MG tablet Take 50 mg by mouth at bedtime as needed for allergies.    Ferrous Sulfate (IRON) 325 (65 Fe) MG TABS Take 1 tablet by mouth 3 (three) times daily.   fluconazole (DIFLUCAN) 150 MG tablet Take one tablet by mouth now. Repeat in 3 days if symptoms persist.   furosemide (LASIX) 40 MG tablet TAKE 1 TABLET BY MOUTH EVERY DAY   gabapentin (NEURONTIN) 300 MG capsule Take 1 capsule (300 mg total) by mouth 3 (three) times daily.   ketoconazole (NIZORAL) 2 % cream APPLY  TO AFFECTED AREA EVERYDAY   levothyroxine (SYNTHROID) 100 MCG tablet TAKE 1 TABLET BY MOUTH DAILY BEFORE BREAKFAST.   lidocaine (LIDODERM) 5 % PLACE 1 PATCH ONTO THE SKIN DAILY. REMOVE & DISCARD PATCH WITHIN 12 HOURS OR AS DIRECTED BY MD   linaclotide (LINZESS) 145 MCG CAPS capsule Take 145 mcg by mouth as needed.   lovastatin (MEVACOR) 40 MG tablet Take 1 tablet (40 mg total) by mouth at bedtime.  metoprolol succinate (TOPROL-XL) 25 MG 24 hr tablet Take 1 tablet (25 mg total) by mouth daily.   mirabegron ER (MYRBETRIQ) 25 MG TB24 tablet Take 1 tablet (25 mg total) by mouth daily.   Multiple Vitamins-Minerals (PRESERVISION AREDS 2 PO) Take by mouth.   nystatin (MYCOSTATIN/NYSTOP) powder APPLY TO AFFECTED AREA 4 TIMES A DAY   olmesartan (BENICAR) 20 MG tablet Take 1 tablet (20 mg total) by mouth daily.   omeprazole (PRILOSEC) 20 MG capsule Take 1 capsule (20 mg total) by mouth 2 (two) times daily before a meal. (Patient taking differently: Take 20 mg by mouth daily.)         pyridOXINE (VITAMIN B-6) 100 MG tablet Take 100 mg by mouth daily.   sertraline (ZOLOFT) 100 MG tablet TAKE 2 TABLETS BY MOUTH EVERY DAY   sodium chloride (OCEAN) 0.65 % SOLN nasal spray Place 1 spray into both nostrils as needed for congestion.              Past Medical History:  Diagnosis Date   Anxiety    Arthritis    CHF (congestive heart failure) (HCC)    Chronic pain of multiple joints    Depression    Essential hypertension    GERD (gastroesophageal reflux disease)    Hyperlipidemia    Hyperlipidemia associated with type 2 diabetes mellitus (HCC)    Hypertension associated with type 2 diabetes mellitus (HCC)    Hypothyroidism    Mild scoliosis 10/19/2021   Osteoporosis    Sleep apnea    CPAP   Type 2 diabetes mellitus with hypercholesterolemia (HCC)    Vitamin D deficiency        Objective:   Wts  03/06/2024       188  06/11/2023       181  03/09/2023       180   02/06/2023       181    11/14/2022     179   08/22/22 173 lb 12.8 oz (78.8 kg)  08/02/22 172 lb (78 kg)  07/04/22 173 lb 3.2 oz (78.6 kg)    Vital signs reviewed  03/06/2024  - Note at rest 02 sats  90% on POC   General appearance:  mod   obese by bmi)  wf nad/ very slow ambulation with cane  HEENT : Oropharynx  clear   /   Nasal turbinates nl   NECK :  without  apparent JVD/ palpable Nodes/TM    LUNGS: no acc muscle use,  Nl contour chest with distant BS bilaterally without cough on insp or exp maneuvers   CV:  RRR  no s3  2/ 6 sem s increase in P2, and trace ankle edema bilaterally   ABD:  obese soft and nontender   MS:  Gait very slow with cane   ext warm without deformities Or obvious joint restrictions  calf tenderness, cyanosis or clubbing    SKIN: warm and dry without lesions    NEURO:  alert, approp, nl sensorium with  no motor or cerebellar deficits apparent.        I personally reviewed images and agree with radiology impression as follows:   Chest CTa    02/28/24  SSA bases  No pulmonary embolus or other acute finding       Labs from 02/28/24 reviewed:  Nl tsh, bnp   HC03 30 Eos 0.06  with Hgb 12.5  Assessment

## 2024-03-06 NOTE — Patient Instructions (Addendum)
 Make sure you check your oxygen saturation  AT  your highest level of activity (not after you stop)   to be sure it stays over 90% and adjust  02 flow upward to maintain this level if needed but remember to turn it back to previous settings when you stop (to conserve your supply).   Keep appt with Dr Lelon Huh and cardiology   Please schedule a follow up visit in 3 months but call sooner if needed   Late add:  needs 4lpm continuous when walking or doing PT (best fit referral)  >>> sent to [facility]

## 2024-03-06 NOTE — Telephone Encounter (Signed)
 Needs referral for best fit for protable as needed 4lpm continuous during walk test

## 2024-03-07 ENCOUNTER — Other Ambulatory Visit: Payer: Self-pay

## 2024-03-07 DIAGNOSIS — R0609 Other forms of dyspnea: Secondary | ICD-10-CM

## 2024-03-07 NOTE — Telephone Encounter (Signed)
 Sent order to [place] for oxygen for patient

## 2024-03-08 ENCOUNTER — Telehealth: Payer: Self-pay | Admitting: Internal Medicine

## 2024-03-08 NOTE — Assessment & Plan Note (Addendum)
 Onset Aug 2022  p GA for L shoulder surgery while on chronic macrodantin - macrodantin d/c 07/04/2022  - Echo 07/11/2022  1 diastolic dysfunction  Mild AS / AI with nl LA - 08/22/2022 try off acei   - S/p macrodantin rx 01/01/23 > worse sats 02/06/2023  so d/c again and rec list as allergy  - 06/11/2023 exac x ? 2 weeks esp x 24 h  > RAF new > to ER  - Chest CTa    02/28/24  SSA bases No PE  or other acute finding     See 0 2 titration / she is more limited by deconditioning/ geriatric decline and l?  valvular heart dz than any lung reversible lung problem at this point   She has a f/u ECHO pending and should do better in meantime simply by keeping her sats > 90% at all times  (see resp failure a/p)   Each maintenance medication was reviewed in detail including emphasizing most importantly the difference between maintenance and prns and under what circumstances the prns are to be triggered using an action plan format where appropriate.  Total time for H and P, chart review, counseling, reviewing hfa device(s) , directly observing portions of ambulatory 02 saturation study/ and generating customized AVS unique to this ACUTE office visit / same day charting =  42  min for    refractory respiratory  symptoms of uncertain etiology              2

## 2024-03-10 DIAGNOSIS — J9611 Chronic respiratory failure with hypoxia: Secondary | ICD-10-CM | POA: Diagnosis not present

## 2024-03-10 DIAGNOSIS — R0609 Other forms of dyspnea: Secondary | ICD-10-CM | POA: Diagnosis not present

## 2024-03-11 ENCOUNTER — Ambulatory Visit: Payer: 59 | Attending: Nurse Practitioner

## 2024-03-11 ENCOUNTER — Other Ambulatory Visit: Payer: Self-pay | Admitting: Nurse Practitioner

## 2024-03-11 DIAGNOSIS — R0602 Shortness of breath: Secondary | ICD-10-CM | POA: Diagnosis not present

## 2024-03-11 DIAGNOSIS — R5383 Other fatigue: Secondary | ICD-10-CM | POA: Diagnosis not present

## 2024-03-11 LAB — ECHOCARDIOGRAM COMPLETE
AR max vel: 2.07 cm2
AV Area VTI: 2 cm2
AV Area mean vel: 2.12 cm2
AV Mean grad: 8 mmHg
AV Peak grad: 16.3 mmHg
Ao pk vel: 2.02 m/s
Area-P 1/2: 2.41 cm2
Calc EF: 59.9 %
MV VTI: 2.7 cm2
S' Lateral: 3.6 cm
Single Plane A2C EF: 54.4 %
Single Plane A4C EF: 65.4 %

## 2024-03-12 ENCOUNTER — Encounter: Payer: Self-pay | Admitting: Nurse Practitioner

## 2024-03-12 ENCOUNTER — Ambulatory Visit (INDEPENDENT_AMBULATORY_CARE_PROVIDER_SITE_OTHER): Admitting: Nurse Practitioner

## 2024-03-12 VITALS — BP 130/72 | HR 63 | Temp 98.0°F | Ht 62.0 in | Wt 191.6 lb

## 2024-03-12 DIAGNOSIS — I270 Primary pulmonary hypertension: Secondary | ICD-10-CM

## 2024-03-12 DIAGNOSIS — R0602 Shortness of breath: Secondary | ICD-10-CM | POA: Diagnosis not present

## 2024-03-12 DIAGNOSIS — Z09 Encounter for follow-up examination after completed treatment for conditions other than malignant neoplasm: Secondary | ICD-10-CM

## 2024-03-12 DIAGNOSIS — F339 Major depressive disorder, recurrent, unspecified: Secondary | ICD-10-CM | POA: Diagnosis not present

## 2024-03-12 MED ORDER — BREXPIPRAZOLE 0.25 MG PO TABS: 0.2500 mg | ORAL_TABLET | Freq: Every day | ORAL | 1 refills | Status: DC

## 2024-03-13 DIAGNOSIS — Z09 Encounter for follow-up examination after completed treatment for conditions other than malignant neoplasm: Secondary | ICD-10-CM | POA: Insufficient documentation

## 2024-03-13 DIAGNOSIS — I270 Primary pulmonary hypertension: Secondary | ICD-10-CM | POA: Insufficient documentation

## 2024-03-14 ENCOUNTER — Other Ambulatory Visit: Payer: Self-pay | Admitting: Family Medicine

## 2024-03-14 DIAGNOSIS — R0609 Other forms of dyspnea: Secondary | ICD-10-CM | POA: Diagnosis not present

## 2024-03-14 DIAGNOSIS — G4733 Obstructive sleep apnea (adult) (pediatric): Secondary | ICD-10-CM | POA: Diagnosis not present

## 2024-03-14 DIAGNOSIS — I509 Heart failure, unspecified: Secondary | ICD-10-CM | POA: Diagnosis not present

## 2024-03-14 DIAGNOSIS — Z1231 Encounter for screening mammogram for malignant neoplasm of breast: Secondary | ICD-10-CM

## 2024-03-24 DIAGNOSIS — M170 Bilateral primary osteoarthritis of knee: Secondary | ICD-10-CM | POA: Diagnosis not present

## 2024-03-26 ENCOUNTER — Ambulatory Visit
Admission: RE | Admit: 2024-03-26 | Discharge: 2024-03-26 | Disposition: A | Discharge status: Home / Self Care | Source: Ambulatory Visit | Attending: Family Medicine | Admitting: Family Medicine

## 2024-03-26 DIAGNOSIS — Z1231 Encounter for screening mammogram for malignant neoplasm of breast: Secondary | ICD-10-CM

## 2024-03-27 ENCOUNTER — Ambulatory Visit (INDEPENDENT_AMBULATORY_CARE_PROVIDER_SITE_OTHER): Admitting: Family Medicine

## 2024-03-27 ENCOUNTER — Encounter: Payer: Self-pay | Admitting: Family Medicine

## 2024-03-27 VITALS — BP 134/68 | HR 69 | Ht 62.0 in | Wt 186.0 lb

## 2024-03-27 DIAGNOSIS — F339 Major depressive disorder, recurrent, unspecified: Secondary | ICD-10-CM

## 2024-03-27 DIAGNOSIS — R0602 Shortness of breath: Secondary | ICD-10-CM | POA: Diagnosis not present

## 2024-03-27 DIAGNOSIS — I270 Primary pulmonary hypertension: Secondary | ICD-10-CM

## 2024-03-27 NOTE — Progress Notes (Signed)
 BP 134/68   Pulse 69   Ht 5\' 2"  (1.575 m)   Wt 186 lb (84.4 kg)   SpO2 94%   BMI 34.02 kg/m    Subjective:   Patient ID: Danielle Parrish, female    DOB: 22-Sep-1941, 83 y.o.   MRN: [id number]  HPI: Danielle Parrish is a 83 y.o. female presenting on 03/27/2024 for Medical Management of Chronic Issues, Shortness of Breath (Doing better. Seeing Dr. Sherene Sires), and Depression (Doing ok. Not going to take Rexulti due to possible side effects)   HPI Depression recheck Patient is coming in for depression recheck.  We had discussed starting Rexulti and she says she does not want to take it because of bradycardia and possible side effects.  She feels like she is doing okay.  Patient denies any major issues with anxiety or depression, she feels like now that her breathing is better she is feeling a lot better and does not want to do the Rexulti medication.    03/27/2024    3:14 PM 03/12/2024    8:59 AM 01/24/2024    2:20 PM 11/01/2023    9:04 AM 10/03/2023   11:30 AM  Depression screen PHQ 2/9  Decreased Interest 1 3 2 2 2   Down, Depressed, Hopeless 1 1 2 2 2   PHQ - 2 Score 2 4 4 4 4   Altered sleeping 1 2 3 2 2   Tired, decreased energy 1 3 3 2 3   Change in appetite 1 2 3 2 1   Feeling bad or failure about yourself  1 2 2 2 1   Trouble concentrating 1 2 0 2 1  Moving slowly or fidgety/restless 0 2 0 1 1  Suicidal thoughts 0 1 0 0 1  PHQ-9 Score 7 18 15 15 14   Difficult doing work/chores Somewhat difficult Somewhat difficult Not difficult at all Somewhat difficult Not difficult at all    Shortness of breath Patient feels like she is doing better, sees Dr. Work who is a pulmonologist.  He got her home oxygen concentrator and she is on 5 L nasal cannula here in 3 to 4 L at home and she feels like she is doing a lot better.  She feels like her breathing is a lot better on the oxygen.  Relevant past medical, surgical, family and social history reviewed and updated as indicated. Interim medical  history since our last visit reviewed. Allergies and medications reviewed and updated.  Review of Systems  Constitutional:  Negative for chills and fever.  HENT:  Positive for congestion. Negative for ear discharge and ear pain.   Eyes:  Negative for redness and visual disturbance.  Respiratory:  Positive for cough, shortness of breath and wheezing. Negative for chest tightness.   Cardiovascular:  Negative for chest pain and leg swelling.  Genitourinary:  Negative for difficulty urinating and dysuria.  Musculoskeletal:  Negative for back pain and gait problem.  Skin:  Negative for rash.  Neurological:  Negative for light-headedness and headaches.  Psychiatric/Behavioral:  Positive for dysphoric mood. Negative for agitation and behavioral problems. The patient is nervous/anxious.   All other systems reviewed and are negative.   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    Penicillins Hives, Other (See Comments)   Has patient had a PCN reaction  causing immediate rash, facial/tongue/throat swelling, SOB or lightheadedness with hypotension: Yes Has patient had a PCN reaction causing severe rash involving mucus membranes or skin necrosis: unknown Has patient had a PCN reaction that required hospitalization No Has patient had a PCN reaction occurring within the last 10 years: No If all of the above answers are "NO", then may proceed with Cephalosporin use.   Rosuvastatin Other (See Comments)   Body aches.   Statins Other (See Comments)   Vytorin [ezetimibe-simvastatin]    Body aches.    Aspirin Nausea Only   Tolmetin Nausea Only   Burns stomach.         Medication List        Accurate as of March 27, 2024  3:26 PM. If you have any questions, ask your nurse or doctor.          STOP taking these medications     brexpiprazole 0.25 MG Tabs tablet Commonly known as: Rexulti Stopped by: Elige Radon Tamora Huneke   predniSONE 10 MG tablet Commonly known as: DELTASONE Stopped by: Elige Radon Bianco Cange       TAKE these medications    acetaminophen 500 MG tablet Commonly known as: TYLENOL Take 1 tablet (500 mg total) by mouth every 6 (six) hours as needed.   albuterol 108 (90 Base) MCG/ACT inhaler Commonly known as: VENTOLIN HFA Take 2 puffs eveery 6 hours as needed for wheezing or shortness of breath   amLODipine 10 MG tablet Commonly known as: NORVASC Take 1 tablet (10 mg total) by mouth daily.   apixaban 5 MG Tabs tablet Commonly known as: Eliquis Take 1 tablet (5 mg total) by mouth 2 (two) times daily.   azelastine 0.1 % nasal spray Commonly known as: ASTELIN Place 1 spray into both nostrils 2 (two) times daily. Use in each nostril as directed   calcium-vitamin D 500-200 MG-UNIT Tabs tablet Commonly known as: OSCAL WITH D TAKE 1 TABLET BY MOUTH EVERY DAY WITH BREAKFAST   diclofenac Sodium 1 % Gel Commonly known as: VOLTAREN APPLY 4 GRAMS TOPICALLY 4 (FOUR) TIMES DAILY.   diphenhydrAMINE 25 MG tablet Commonly known as: BENADRYL Take 50 mg by mouth at bedtime as needed for allergies.   fluconazole 150 MG tablet Commonly known as: Diflucan Take one tablet by mouth now. Repeat in 3 days if symptoms persist.   furosemide 40 MG tablet Commonly known as: LASIX TAKE 1 TABLET BY MOUTH EVERY DAY   gabapentin 300 MG capsule Commonly known as: NEURONTIN Take 1 capsule (300 mg total) by mouth 3 (three) times daily.   Iron 325 (65 Fe) MG Tabs Take 1 tablet by mouth 3 (three) times daily.   ketoconazole 2 % cream Commonly known as: NIZORAL APPLY TO AFFECTED AREA EVERYDAY   levothyroxine 100 MCG tablet Commonly known as: SYNTHROID TAKE 1 TABLET BY MOUTH DAILY BEFORE BREAKFAST.   lidocaine 5 % Commonly known as: LIDODERM PLACE 1 PATCH ONTO THE SKIN DAILY. REMOVE & DISCARD PATCH  WITHIN 12 HOURS OR AS DIRECTED BY MD   linaclotide 145 MCG Caps capsule Commonly known as: LINZESS Take 145 mcg by mouth as needed.   lovastatin 40 MG tablet Commonly known as: MEVACOR Take 1 tablet (40 mg total) by mouth at bedtime.   metoprolol succinate 25 MG 24 hr tablet Commonly known as: TOPROL-XL Take 1 tablet (25 mg total) by mouth daily.   mirabegron ER 25 MG Tb24 tablet Commonly known as: Myrbetriq Take 1 tablet (25 mg total) by mouth  daily.   nystatin powder Commonly known as: MYCOSTATIN/NYSTOP APPLY TO AFFECTED AREA 4 TIMES A DAY   olmesartan 20 MG tablet Commonly known as: BENICAR Take 1 tablet (20 mg total) by mouth daily.   omeprazole 20 MG capsule Commonly known as: PRILOSEC Take 1 capsule (20 mg total) by mouth 2 (two) times daily before a meal. What changed: when to take this   PRESERVISION AREDS 2 PO Take by mouth.   pyridOXINE 100 MG tablet Commonly known as: VITAMIN B6 Take 100 mg by mouth daily.   sertraline 100 MG tablet Commonly known as: ZOLOFT TAKE 2 TABLETS BY MOUTH EVERY DAY   sodium chloride 0.65 % Soln nasal spray Commonly known as: OCEAN Place 1 spray into both nostrils as needed for congestion.   VITAMIN D-3 PO Take 1 capsule by mouth daily.         Objective:   BP 134/68   Pulse 69   Ht 5\' 2"  (1.575 m)   Wt 186 lb (84.4 kg)   SpO2 94%   BMI 34.02 kg/m   Wt Readings from Last 3 Encounters:  03/27/24 186 lb (84.4 kg)  03/12/24 191 lb 9.6 oz (86.9 kg)  03/06/24 188 lb 12.8 oz (85.6 kg)    Physical Exam Vitals and nursing note reviewed.  Constitutional:      General: She is not in acute distress.    Appearance: She is well-developed. She is obese. She is not diaphoretic.     Comments: On 5 L nasal cannula, appears comfortable  Eyes:     Conjunctiva/sclera: Conjunctivae normal.  Cardiovascular:     Rate and Rhythm: Normal rate and regular rhythm.     Heart sounds: Normal heart sounds. No murmur  heard. Pulmonary:     Effort: Pulmonary effort is normal. No respiratory distress.     Breath sounds: Normal breath sounds. No wheezing, rhonchi or rales.  Skin:    General: Skin is warm and dry.     Findings: No rash.  Neurological:     Mental Status: She is alert and oriented to person, place, and time.     Coordination: Coordination normal.  Psychiatric:        Mood and Affect: Mood is anxious. Mood is not depressed.        Behavior: Behavior normal.        Thought Content: Thought content does not include suicidal ideation. Thought content does not include suicidal plan.       Assessment & Plan:   Problem List Items Addressed This Visit       Cardiovascular and Mediastinum   Pulmonary hypertension, primary (HCC)     Other   Depression, recurrent (HCC) - Primary   Shortness of breath    Seems to be doing a lot better, will monitor for now, recommended that she still follow-up with her pulmonologist regularly. Follow up plan: Return if symptoms worsen or fail to improve, for Has appointment for the end of the month.  Counseling provided for all of the vaccine components No orders of the defined types were placed in this encounter.   Arville Care, MD [facility] 03/27/2024, 3:26 PM

## 2024-04-02 ENCOUNTER — Ambulatory Visit: Attending: Orthopedic Surgery

## 2024-04-02 DIAGNOSIS — G8929 Other chronic pain: Secondary | ICD-10-CM | POA: Insufficient documentation

## 2024-04-02 DIAGNOSIS — M25561 Pain in right knee: Secondary | ICD-10-CM | POA: Diagnosis not present

## 2024-04-02 DIAGNOSIS — M545 Low back pain, unspecified: Secondary | ICD-10-CM | POA: Diagnosis not present

## 2024-04-02 DIAGNOSIS — M25562 Pain in left knee: Secondary | ICD-10-CM | POA: Insufficient documentation

## 2024-04-02 DIAGNOSIS — R2681 Unsteadiness on feet: Secondary | ICD-10-CM | POA: Insufficient documentation

## 2024-04-02 NOTE — Therapy (Addendum)
 OUTPATIENT PHYSICAL THERAPY LOWER EXTREMITY TREATMENT   Patient Name: Danielle Parrish MRN: [id number] DOB:12-09-1941, 83 y.o., female Today's Date: 04/02/2024  END OF SESSION:  PT End of Session - 04/02/24 1437     Visit Number 12    Number of Visits 18    Date for PT Re-Evaluation 05/02/24    PT Start Time 1430    PT Stop Time 1515    PT Time Calculation (min) 45 min    Equipment Utilized During Treatment Oxygen    Activity Tolerance Patient tolerated treatment well    Behavior During Therapy WFL for tasks assessed/performed              Past Medical History:  Diagnosis Date   Anxiety    Arthritis    CHF (congestive heart failure) (HCC)    Chronic pain of multiple joints    Depression    Essential hypertension    GERD (gastroesophageal reflux disease)    Hyperlipidemia    Hyperlipidemia associated with type 2 diabetes mellitus (HCC)    Hypertension associated with type 2 diabetes mellitus (HCC)    Hypothyroidism    Mild scoliosis 10/19/2021   Osteoporosis    Sleep apnea    CPAP   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, left 09/05/2023   left wrist   needs excision   Squamous cell carcinoma of skin 09/05/2023   left ear   needs mohs   Type 2 diabetes mellitus with hypercholesterolemia (HCC)    Vitamin D deficiency    Past Surgical History:  Procedure Laterality Date   ABDOMINAL HERNIA REPAIR     ABDOMINAL HYSTERECTOMY  1977   BREAST EXCISIONAL BIOPSY Right    EYE SURGERY  2020   bilateral cataract removal    KNEE ARTHROSCOPY Left 09/30/2014   Procedure: LEFT KNEE ARTHROSCOPY MEDIAL MENISECTOMY ABRASION CHONDROPLASTY SYNOVECTOMY SUPRAPATELLER CUFF;  Surgeon: Jacki Cones, MD;  Location: [facility];  Service: Orthopedics;  Laterality: Left;   Patient Active Problem List   Diagnosis Date Noted   Hospital discharge follow-up 03/13/2024   Pulmonary hypertension, primary (HCC) 03/13/2024   Chronic anticoagulation 09/07/2023   Paroxysmal atrial fibrillation  (HCC) 06/19/2023   Hypercoagulable state due to paroxysmal atrial fibrillation (HCC) 06/19/2023   Chronic respiratory failure with hypoxia (HCC) 07/10/2022   Shortness of breath 07/04/2022   Chronic midline low back pain without sciatica 01/30/2019   Age-related osteoporosis with current pathol fracture of vertebra (HCC) 10/28/2018   Calcification of aorta (HCC) 10/02/2018   Compression fracture of T12 vertebra (HCC) 10/01/2018   Primary osteoarthritis of right knee 07/20/2017   Varicose veins of left lower extremity with complications 07/31/2016   OSA (obstructive sleep apnea) 09/14/2015   Chronic diastolic heart failure (HCC) 09/10/2015   Dysphagia, pharyngoesophageal phase 08/04/2015   Anorexia 08/04/2015   Malaise and fatigue 08/04/2015   Depression, recurrent (HCC) 04/08/2015   Vitamin D deficiency    Anxiety    GERD (gastroesophageal reflux disease)    Arthritis    Hypertension associated with type 2 diabetes mellitus (HCC) 05/15/2013   Hyperlipidemia associated with type 2 diabetes mellitus (HCC) 05/15/2013   Type 2 diabetes mellitus with hypercholesterolemia (HCC) 05/15/2013   Morbid obesity (HCC) 05/15/2013   Hypothyroidism 05/15/2013    PCP: Dettinger, Elige Radon, MD  REFERRING PROVIDER: Ranee Gosselin, MD   REFERRING DIAG: Right knee pain, Left knee pain   THERAPY DIAG:  Unsteadiness on feet  Chronic pain of right knee  Chronic pain  of left knee  Rationale for Evaluation and Treatment: Rehabilitation  ONSET DATE: "years ago"   SUBJECTIVE:   SUBJECTIVE STATEMENT: Pt denies any pain, reports feeling much better.  SpO2 91% on normal O2 at beginning of session.   PERTINENT HISTORY: Hypertension, diabetes type 2, chronic diastolic heart failure, atrial fibrillation, arthritis, osteoporosis, anxiety, depression, and requires portable oxygen PAIN:  Are you having pain? No  PRECAUTIONS: None  RED FLAGS: None   WEIGHT BEARING RESTRICTIONS: No  FALLS:   Has patient fallen in last 6 months? No  LIVING ENVIRONMENT: Lives with: lives with their family Lives in: House/apartment Stairs: Yes: Internal: 20 steps; on right going up; step to pattern Has following equipment at home: Single point cane (patient only uses the cane when she is away from home)   OCCUPATION: retired  PLOF: Independent  PATIENT GOALS: walk better  NEXT MD VISIT: none scheduled  OBJECTIVE:  Note: Objective measures were completed at Evaluation unless otherwise noted.  COGNITION: Overall cognitive status: Within functional limits for tasks assessed     SENSATION: Patient reports no numbness or tingling  EDEMA:  No significant edema observed  POSTURE: rounded shoulders, forward head, decreased lumbar lordosis, and increased thoracic kyphosis  PALPATION: No tenderness to palpation reported  LOWER EXTREMITY ROM: assessed in sitting  Active ROM Right eval Left eval  Hip flexion    Hip extension    Hip abduction    Hip adduction    Hip internal rotation    Hip external rotation    Knee flexion 117 107  Knee extension 6 11  Ankle dorsiflexion    Ankle plantarflexion    Ankle inversion    Ankle eversion     (Blank rows = not tested)  LOWER EXTREMITY MMT:  MMT Right eval Left eval  Hip flexion 4-/5 4+/5  Hip extension    Hip abduction    Hip adduction    Hip internal rotation    Hip external rotation    Knee flexion 4+/5 4+/5  Knee extension 4+/5 5/5  Ankle dorsiflexion 4/5 4/5  Ankle plantarflexion    Ankle inversion    Ankle eversion     (Blank rows = not tested)  FUNCTIONAL TESTS:  5 times sit to stand: 28.17 seconds with single armrest and SPC  Timed up and go (TUG): 28.32 seconds with SPC and oxygen  GAIT: Assistive device utilized: Single point cane Level of assistance: Modified independence Comments: Decreased gait speed and stride length with poor foot clearance, heel strike, and toe off bilaterally                                                                                                                                 TREATMENT DATE:  04/02/24    EXERCISE LOG  Exercise Repetitions and Resistance Comments  Nustep Lvl 3 x 15 mins   Cybex Knee Flexion 30# x 3 mins   Cybex Knee  Extension 10# x 3 mins   Rockerboard  4 mins   LAQs    Seated Marches    Seated Hip Adduction    Seated Hip Abduction    Seated Ham Curls    5 STS 19.05 seconds   TUG 20.8 seconds   Rows    Extensions     Blank cell = exercise not performed today                                    01/23/24 EXERCISE LOG  Exercise Repetitions and Resistance Comments  Nustep  L4 x 17 minutes   LAQ 3# x 3 minutes    Marching on foam  3 minutes  BUE support  Step up  6" step x 10 reps each  With BUE support  Seated marching 3# x 2.5 minutes    Blank cell = exercise not performed today                  01/21/24                   EXERCISE LOG  Exercise Repetitions and Resistance Comments  Nustep Lvl 1 x 10 mins   Rockerboard  (Seated) 3 mins   LAQs 2# x 20 reps bils   Seated Marches 2# x 15 reps bils   Seated Hip Adduction 3 mins   Seated Hip Abduction Red x 3 mins   Seated Ham Curls Red x 20 reps bil    Blank cell = exercise not performed today   PATIENT EDUCATION:  Education details: progress with physical therapy and benefits of exercise Person educated: Patient Education method: Explanation Education comprehension: verbalized understanding  HOME EXERCISE PROGRAM:   ASSESSMENT:  CLINICAL IMPRESSION: Pt arrives for today's treatment session denying any pain.  Pt reports getting a concentrator for home use and she if feeling much better.  Pt able to perform 5 STS in 19.05 seconds and TUG in 20.7 seconds meeting both of her STGs.  Pt is also able to perform STS transfer without use of UE support, meeting a long term goal.  Pt is much improved since the last treatment session.  Pt's SpO2 monitored throughout treatment session  with pt able to maintain SpO2 > 92% throughout session.  Pt denied any pain at completion of today's treatment session.   04/02/24 PROGRESS REPORT:  Patient is making good progress with skilled physical therapy as evidenced by her objective measures, functional mobility, and progress toward her goals. She was able to meet all of her short term goals for physical therapy at this time. She was also able to transfer from sitting to standing without upper extremity support need for improved independence. However, she was unable to meet her long term goals for improved lower extremity power to reduce her fall risk. Recommend that she continue with skilled physical therapy for six additional visits at twice per week to address her remaining impairments to maximize her functional mobility and safety.   Candi Leash, PT, DPT   OBJECTIVE IMPAIRMENTS: Abnormal gait, decreased activity tolerance, decreased balance, decreased mobility, difficulty walking, decreased ROM, decreased strength, and pain.   ACTIVITY LIMITATIONS: carrying, lifting, squatting, stairs, transfers, and locomotion level  PARTICIPATION LIMITATIONS: meal prep, cleaning, laundry, shopping, and community activity  PERSONAL FACTORS: Age, Past/current experiences, Time since onset of injury/illness/exacerbation, Transportation, and 3+ comorbidities: Hypertension, diabetes type 2, chronic diastolic heart failure,  atrial fibrillation, arthritis, osteoporosis, anxiety, depression, and requires portable oxygen  are also affecting patient's functional outcome.   REHAB POTENTIAL: Fair    CLINICAL DECISION MAKING: Evolving/moderate complexity  EVALUATION COMPLEXITY: Moderate   GOALS: Goals reviewed with patient? Yes  SHORT TERM GOALS: Target date: 02/06/24 Patient will be independent with her initial HEP. Baseline: Goal status: MET  2.  Patient will improve her 5 times sit to stand time to 21 seconds or less for improved lower extremity  power. Baseline: 2/10: 24.01 seconds; 3/5: 28.81 seconds; 4/9: 19.05 seconds Goal status: MET  3.  Patient will improve her timed up and go time to 21 seconds or less for improved functional mobility. Baseline: 2/10: 27.13;  3/5: 27.38 seconds; 4/9: 20.7 seconds Goal status: MET  LONG TERM GOALS: Target date: 02/27/24  Patient will be independent with her advanced HEP. Baseline:  Goal status: IN PROGRESS  2.  Patient will be able to transfer with minimal to no upper extremity support for improved independence. Baseline:  Goal status: MET  3.  Patient will improve her timed up and go time to 15 seconds or less for improved safety and reduced fall risk. Baseline:  Goal status: IN PROGRESS  4.  Patient will improve her 5 times sit to stand time to 15 seconds or less to reduce her fall risk. Baseline:  Goal status: IN PROGRESS  PLAN:  PT FREQUENCY: 2x/week  PT DURATION: 6 weeks  PLANNED INTERVENTIONS: 97164- PT Re-evaluation, 97110-Therapeutic exercises, 97530- Therapeutic activity, 97112- Neuromuscular re-education, 97535- Self Care, 95284- Manual therapy, [phone]- Gait training, Patient/Family education, Balance training, Stair training, Joint mobilization, Cryotherapy, and Moist heat  PLAN FOR NEXT SESSION: Nustep, lower extremity strengthening, gait training, and manual therapy as needed   Newman Pies, PTA 04/02/2024, 4:22 PM

## 2024-04-02 NOTE — Addendum Note (Signed)
 Addended by: Granville Lewis on: 04/02/2024 05:36 PM   Modules accepted: Orders

## 2024-04-10 DIAGNOSIS — J9611 Chronic respiratory failure with hypoxia: Secondary | ICD-10-CM | POA: Diagnosis not present

## 2024-04-10 DIAGNOSIS — R0609 Other forms of dyspnea: Secondary | ICD-10-CM | POA: Diagnosis not present

## 2024-04-14 ENCOUNTER — Ambulatory Visit

## 2024-04-14 DIAGNOSIS — G4733 Obstructive sleep apnea (adult) (pediatric): Secondary | ICD-10-CM | POA: Diagnosis not present

## 2024-04-14 DIAGNOSIS — R0609 Other forms of dyspnea: Secondary | ICD-10-CM | POA: Diagnosis not present

## 2024-04-14 DIAGNOSIS — I509 Heart failure, unspecified: Secondary | ICD-10-CM | POA: Diagnosis not present

## 2024-04-15 ENCOUNTER — Other Ambulatory Visit: Payer: 59

## 2024-04-17 ENCOUNTER — Ambulatory Visit

## 2024-04-17 DIAGNOSIS — G8929 Other chronic pain: Secondary | ICD-10-CM

## 2024-04-17 DIAGNOSIS — R2681 Unsteadiness on feet: Secondary | ICD-10-CM | POA: Diagnosis not present

## 2024-04-17 DIAGNOSIS — M25561 Pain in right knee: Secondary | ICD-10-CM | POA: Diagnosis not present

## 2024-04-17 DIAGNOSIS — M545 Low back pain, unspecified: Secondary | ICD-10-CM | POA: Diagnosis not present

## 2024-04-17 DIAGNOSIS — M25562 Pain in left knee: Secondary | ICD-10-CM | POA: Diagnosis not present

## 2024-04-17 NOTE — Therapy (Signed)
 OUTPATIENT PHYSICAL THERAPY LOWER EXTREMITY TREATMENT   Patient Name: Danielle Parrish MRN: [id number] DOB:1941-07-07, 83 y.o., female Today's Date: 04/17/2024  END OF SESSION:  PT End of Session - 04/17/24 1436     Visit Number 13    Number of Visits 18    Date for PT Re-Evaluation 05/02/24    PT Start Time 1430    PT Stop Time 1515    PT Time Calculation (min) 45 min    Equipment Utilized During Treatment Oxygen     Activity Tolerance Patient tolerated treatment well    Behavior During Therapy WFL for tasks assessed/performed              Past Medical History:  Diagnosis Date   Anxiety    Arthritis    CHF (congestive heart failure) (HCC)    Chronic pain of multiple joints    Depression    Essential hypertension    GERD (gastroesophageal reflux disease)    Hyperlipidemia    Hyperlipidemia associated with type 2 diabetes mellitus (HCC)    Hypertension associated with type 2 diabetes mellitus (HCC)    Hypothyroidism    Mild scoliosis 10/19/2021   Osteoporosis    Sleep apnea    CPAP   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, left 09/05/2023   left wrist   needs excision   Squamous cell carcinoma of skin 09/05/2023   left ear   needs mohs   Type 2 diabetes mellitus with hypercholesterolemia (HCC)    Vitamin D  deficiency    Past Surgical History:  Procedure Laterality Date   ABDOMINAL HERNIA REPAIR     ABDOMINAL HYSTERECTOMY  1977   BREAST EXCISIONAL BIOPSY Right    EYE SURGERY  2020   bilateral cataract removal    KNEE ARTHROSCOPY Left 09/30/2014   Procedure: LEFT KNEE ARTHROSCOPY MEDIAL MENISECTOMY ABRASION CHONDROPLASTY SYNOVECTOMY SUPRAPATELLER CUFF;  Surgeon: Florencia Hunter, MD;  Location: [facility];  Service: Orthopedics;  Laterality: Left;   Patient Active Problem List   Diagnosis Date Noted   Hospital discharge follow-up 03/13/2024   Pulmonary hypertension, primary (HCC) 03/13/2024   Chronic anticoagulation 09/07/2023   Paroxysmal atrial fibrillation  (HCC) 06/19/2023   Hypercoagulable state due to paroxysmal atrial fibrillation (HCC) 06/19/2023   Chronic respiratory failure with hypoxia (HCC) 07/10/2022   Shortness of breath 07/04/2022   Chronic midline low back pain without sciatica 01/30/2019   Age-related osteoporosis with current pathol fracture of vertebra (HCC) 10/28/2018   Calcification of aorta (HCC) 10/02/2018   Compression fracture of T12 vertebra (HCC) 10/01/2018   Primary osteoarthritis of right knee 07/20/2017   Varicose veins of left lower extremity with complications 07/31/2016   OSA (obstructive sleep apnea) 09/14/2015   Chronic diastolic heart failure (HCC) 09/10/2015   Dysphagia, pharyngoesophageal phase 08/04/2015   Anorexia 08/04/2015   Malaise and fatigue 08/04/2015   Depression, recurrent (HCC) 04/08/2015   Vitamin D  deficiency    Anxiety    GERD (gastroesophageal reflux disease)    Arthritis    Hypertension associated with type 2 diabetes mellitus (HCC) 05/15/2013   Hyperlipidemia associated with type 2 diabetes mellitus (HCC) 05/15/2013   Type 2 diabetes mellitus with hypercholesterolemia (HCC) 05/15/2013   Morbid obesity (HCC) 05/15/2013   Hypothyroidism 05/15/2013    PCP: Dettinger, Lucio Sabin, MD  REFERRING PROVIDER: Hazle Lites, MD   REFERRING DIAG: Right knee pain, Left knee pain   THERAPY DIAG:  Unsteadiness on feet  Chronic pain of right knee  Chronic pain  of left knee  Rationale for Evaluation and Treatment: Rehabilitation  ONSET DATE: "years ago"   SUBJECTIVE:   SUBJECTIVE STATEMENT: Pt denies any pain today.    PERTINENT HISTORY: Hypertension, diabetes type 2, chronic diastolic heart failure, atrial fibrillation, arthritis, osteoporosis, anxiety, depression, and requires portable oxygen  PAIN:  Are you having pain? No  PRECAUTIONS: None  RED FLAGS: None   WEIGHT BEARING RESTRICTIONS: No  FALLS:  Has patient fallen in last 6 months? No  LIVING ENVIRONMENT: Lives  with: lives with their family Lives in: House/apartment Stairs: Yes: Internal: 20 steps; on right going up; step to pattern Has following equipment at home: Single point cane (patient only uses the cane when she is away from home)   OCCUPATION: retired  PLOF: Independent  PATIENT GOALS: walk better  NEXT MD VISIT: none scheduled  OBJECTIVE:  Note: Objective measures were completed at Evaluation unless otherwise noted.  COGNITION: Overall cognitive status: Within functional limits for tasks assessed     SENSATION: Patient reports no numbness or tingling  EDEMA:  No significant edema observed  POSTURE: rounded shoulders, forward head, decreased lumbar lordosis, and increased thoracic kyphosis  PALPATION: No tenderness to palpation reported  LOWER EXTREMITY ROM: assessed in sitting  Active ROM Right eval Left eval  Hip flexion    Hip extension    Hip abduction    Hip adduction    Hip internal rotation    Hip external rotation    Knee flexion 117 107  Knee extension 6 11  Ankle dorsiflexion    Ankle plantarflexion    Ankle inversion    Ankle eversion     (Blank rows = not tested)  LOWER EXTREMITY MMT:  MMT Right eval Left eval  Hip flexion 4-/5 4+/5  Hip extension    Hip abduction    Hip adduction    Hip internal rotation    Hip external rotation    Knee flexion 4+/5 4+/5  Knee extension 4+/5 5/5  Ankle dorsiflexion 4/5 4/5  Ankle plantarflexion    Ankle inversion    Ankle eversion     (Blank rows = not tested)  FUNCTIONAL TESTS:  5 times sit to stand: 28.17 seconds with single armrest and SPC  Timed up and go (TUG): 28.32 seconds with SPC and oxygen   GAIT: Assistive device utilized: Single point cane Level of assistance: Modified independence Comments: Decreased gait speed and stride length with poor foot clearance, heel strike, and toe off bilaterally                                                                                                                                 TREATMENT DATE:  04/17/24    EXERCISE LOG  Exercise Repetitions and Resistance Comments  Nustep Lvl 3 x 15 mins   Cybex Knee Flexion 30# x 3.5 mins   Cybex Knee Extension 10# x 3.5 mins   Rockerboard  4 mins  LAQs    Seated Marches 3# x 30 reps bil   Seated Hip Adduction 4 min   Seated Hip Abduction Red 4 min   Seated Ham Curls Red x 30 reps bil   5 STS    TUG    Rows    Extensions     Blank cell = exercise not performed today                                    01/23/24 EXERCISE LOG  Exercise Repetitions and Resistance Comments  Nustep  L4 x 17 minutes   LAQ 3# x 3 minutes    Marching on foam  3 minutes  BUE support  Step up  6" step x 10 reps each  With BUE support  Seated marching 3# x 2.5 minutes    Blank cell = exercise not performed today                  01/21/24                   EXERCISE LOG  Exercise Repetitions and Resistance Comments  Nustep Lvl 1 x 10 mins   Rockerboard  (Seated) 3 mins   LAQs 2# x 20 reps bils   Seated Marches 2# x 15 reps bils   Seated Hip Adduction 3 mins   Seated Hip Abduction Red x 3 mins   Seated Ham Curls Red x 20 reps bil    Blank cell = exercise not performed today   PATIENT EDUCATION:  Education details: progress with physical therapy and benefits of exercise Person educated: Patient Education method: Explanation Education comprehension: verbalized understanding  HOME EXERCISE PROGRAM:   ASSESSMENT:  CLINICAL IMPRESSION:  Pt arrives for today's treatment session denying any pain.  Pt able to tolerate increased time on cybex exercises today, but reports increased fatigue upon completion of using cybex machinery.  Pt given seated rest breaks as needed due to fatigue.  Pt's SpO2 monitored throughout treatment session with SpO2 remaining > 92% on baseline O2.  Pt denied any pain at completion of today's treatment session, but does report increased fatigue.  OBJECTIVE IMPAIRMENTS: Abnormal  gait, decreased activity tolerance, decreased balance, decreased mobility, difficulty walking, decreased ROM, decreased strength, and pain.   ACTIVITY LIMITATIONS: carrying, lifting, squatting, stairs, transfers, and locomotion level  PARTICIPATION LIMITATIONS: meal prep, cleaning, laundry, shopping, and community activity  PERSONAL FACTORS: Age, Past/current experiences, Time since onset of injury/illness/exacerbation, Transportation, and 3+ comorbidities: Hypertension, diabetes type 2, chronic diastolic heart failure, atrial fibrillation, arthritis, osteoporosis, anxiety, depression, and requires portable oxygen   are also affecting patient's functional outcome.   REHAB POTENTIAL: Fair    CLINICAL DECISION MAKING: Evolving/moderate complexity  EVALUATION COMPLEXITY: Moderate   GOALS: Goals reviewed with patient? Yes  SHORT TERM GOALS: Target date: 02/06/24 Patient will be independent with her initial HEP. Baseline: Goal status: MET  2.  Patient will improve her 5 times sit to stand time to 21 seconds or less for improved lower extremity power. Baseline: 2/10: 24.01 seconds; 3/5: 28.81 seconds; 4/9: 19.05 seconds Goal status: MET  3.  Patient will improve her timed up and go time to 21 seconds or less for improved functional mobility. Baseline: 2/10: 27.13;  3/5: 27.38 seconds; 4/9: 20.7 seconds Goal status: MET  LONG TERM GOALS: Target date: 02/27/24  Patient will be independent with her advanced HEP. Baseline:  Goal  status: IN PROGRESS  2.  Patient will be able to transfer with minimal to no upper extremity support for improved independence. Baseline:  Goal status: MET  3.  Patient will improve her timed up and go time to 15 seconds or less for improved safety and reduced fall risk. Baseline:  Goal status: IN PROGRESS  4.  Patient will improve her 5 times sit to stand time to 15 seconds or less to reduce her fall risk. Baseline:  Goal status: IN PROGRESS  PLAN:  PT  FREQUENCY: 2x/week  PT DURATION: 6 weeks  PLANNED INTERVENTIONS: 97164- PT Re-evaluation, 97110-Therapeutic exercises, 97530- Therapeutic activity, 97112- Neuromuscular re-education, 97535- Self Care, 82956- Manual therapy, [phone]- Gait training, Patient/Family education, Balance training, Stair training, Joint mobilization, Cryotherapy, and Moist heat  PLAN FOR NEXT SESSION: Nustep, lower extremity strengthening, gait training, and manual therapy as needed   Deryl Flora, PTA 04/17/2024, 3:59 PM

## 2024-04-21 ENCOUNTER — Ambulatory Visit

## 2024-04-21 DIAGNOSIS — M25562 Pain in left knee: Secondary | ICD-10-CM | POA: Diagnosis not present

## 2024-04-21 DIAGNOSIS — G8929 Other chronic pain: Secondary | ICD-10-CM

## 2024-04-21 DIAGNOSIS — M545 Low back pain, unspecified: Secondary | ICD-10-CM | POA: Diagnosis not present

## 2024-04-21 DIAGNOSIS — R2681 Unsteadiness on feet: Secondary | ICD-10-CM | POA: Diagnosis not present

## 2024-04-21 DIAGNOSIS — M25561 Pain in right knee: Secondary | ICD-10-CM | POA: Diagnosis not present

## 2024-04-21 NOTE — Therapy (Signed)
 OUTPATIENT PHYSICAL THERAPY LOWER EXTREMITY TREATMENT   Patient Name: Danielle Parrish MRN: [id number] DOB:09/29/1941, 83 y.o., female Today's Date: 04/21/2024  END OF SESSION:  PT End of Session - 04/21/24 1436     Visit Number 14    Number of Visits 18    Date for PT Re-Evaluation 05/02/24    PT Start Time 1430    Equipment Utilized During Treatment Oxygen     Activity Tolerance Patient tolerated treatment well    Behavior During Therapy WFL for tasks assessed/performed              Past Medical History:  Diagnosis Date   Anxiety    Arthritis    CHF (congestive heart failure) (HCC)    Chronic pain of multiple joints    Depression    Essential hypertension    GERD (gastroesophageal reflux disease)    Hyperlipidemia    Hyperlipidemia associated with type 2 diabetes mellitus (HCC)    Hypertension associated with type 2 diabetes mellitus (HCC)    Hypothyroidism    Mild scoliosis 10/19/2021   Osteoporosis    Sleep apnea    CPAP   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, left 09/05/2023   left wrist   needs excision   Squamous cell carcinoma of skin 09/05/2023   left ear   needs mohs   Type 2 diabetes mellitus with hypercholesterolemia (HCC)    Vitamin D  deficiency    Past Surgical History:  Procedure Laterality Date   ABDOMINAL HERNIA REPAIR     ABDOMINAL HYSTERECTOMY  1977   BREAST EXCISIONAL BIOPSY Right    EYE SURGERY  2020   bilateral cataract removal    KNEE ARTHROSCOPY Left 09/30/2014   Procedure: LEFT KNEE ARTHROSCOPY MEDIAL MENISECTOMY ABRASION CHONDROPLASTY SYNOVECTOMY SUPRAPATELLER CUFF;  Surgeon: Florencia Hunter, MD;  Location: [facility];  Service: Orthopedics;  Laterality: Left;   Patient Active Problem List   Diagnosis Date Noted   Hospital discharge follow-up 03/13/2024   Pulmonary hypertension, primary (HCC) 03/13/2024   Chronic anticoagulation 09/07/2023   Paroxysmal atrial fibrillation (HCC) 06/19/2023   Hypercoagulable state due to paroxysmal  atrial fibrillation (HCC) 06/19/2023   Chronic respiratory failure with hypoxia (HCC) 07/10/2022   Shortness of breath 07/04/2022   Chronic midline low back pain without sciatica 01/30/2019   Age-related osteoporosis with current pathol fracture of vertebra (HCC) 10/28/2018   Calcification of aorta (HCC) 10/02/2018   Compression fracture of T12 vertebra (HCC) 10/01/2018   Primary osteoarthritis of right knee 07/20/2017   Varicose veins of left lower extremity with complications 07/31/2016   OSA (obstructive sleep apnea) 09/14/2015   Chronic diastolic heart failure (HCC) 09/10/2015   Dysphagia, pharyngoesophageal phase 08/04/2015   Anorexia 08/04/2015   Malaise and fatigue 08/04/2015   Depression, recurrent (HCC) 04/08/2015   Vitamin D  deficiency    Anxiety    GERD (gastroesophageal reflux disease)    Arthritis    Hypertension associated with type 2 diabetes mellitus (HCC) 05/15/2013   Hyperlipidemia associated with type 2 diabetes mellitus (HCC) 05/15/2013   Type 2 diabetes mellitus with hypercholesterolemia (HCC) 05/15/2013   Morbid obesity (HCC) 05/15/2013   Hypothyroidism 05/15/2013    PCP: Dettinger, Lucio Sabin, MD  REFERRING PROVIDER: Hazle Lites, MD   REFERRING DIAG: Right knee pain, Left knee pain   THERAPY DIAG:  Unsteadiness on feet  Chronic pain of right knee  Chronic pain of left knee  Chronic bilateral low back pain without sciatica  Rationale for Evaluation and  Treatment: Rehabilitation  ONSET DATE: "years ago"   SUBJECTIVE:   SUBJECTIVE STATEMENT: Pt denies any pain today.    PERTINENT HISTORY: Hypertension, diabetes type 2, chronic diastolic heart failure, atrial fibrillation, arthritis, osteoporosis, anxiety, depression, and requires portable oxygen  PAIN:  Are you having pain? No  PRECAUTIONS: None  RED FLAGS: None   WEIGHT BEARING RESTRICTIONS: No  FALLS:  Has patient fallen in last 6 months? No  LIVING ENVIRONMENT: Lives with:  lives with their family Lives in: House/apartment Stairs: Yes: Internal: 20 steps; on right going up; step to pattern Has following equipment at home: Single point cane (patient only uses the cane when she is away from home)   OCCUPATION: retired  PLOF: Independent  PATIENT GOALS: walk better  NEXT MD VISIT: none scheduled  OBJECTIVE:  Note: Objective measures were completed at Evaluation unless otherwise noted.  COGNITION: Overall cognitive status: Within functional limits for tasks assessed     SENSATION: Patient reports no numbness or tingling  EDEMA:  No significant edema observed  POSTURE: rounded shoulders, forward head, decreased lumbar lordosis, and increased thoracic kyphosis  PALPATION: No tenderness to palpation reported  LOWER EXTREMITY ROM: assessed in sitting  Active ROM Right eval Left eval  Hip flexion    Hip extension    Hip abduction    Hip adduction    Hip internal rotation    Hip external rotation    Knee flexion 117 107  Knee extension 6 11  Ankle dorsiflexion    Ankle plantarflexion    Ankle inversion    Ankle eversion     (Blank rows = not tested)  LOWER EXTREMITY MMT:  MMT Right eval Left eval  Hip flexion 4-/5 4+/5  Hip extension    Hip abduction    Hip adduction    Hip internal rotation    Hip external rotation    Knee flexion 4+/5 4+/5  Knee extension 4+/5 5/5  Ankle dorsiflexion 4/5 4/5  Ankle plantarflexion    Ankle inversion    Ankle eversion     (Blank rows = not tested)  FUNCTIONAL TESTS:  5 times sit to stand: 28.17 seconds with single armrest and SPC  Timed up and go (TUG): 28.32 seconds with SPC and oxygen   GAIT: Assistive device utilized: Single point cane Level of assistance: Modified independence Comments: Decreased gait speed and stride length with poor foot clearance, heel strike, and toe off bilaterally                                                                                                                                 TREATMENT DATE:                                  04/21/24    EXERCISE LOG  Exercise Repetitions and Resistance Comments  Nustep Lvl 3 x 15 mins  Cybex Knee Flexion 30# x 3.5 mins   Cybex Knee Extension 10# x 3.5 mins   Rockerboard  4 mins   LAQs    Seated Marches 3# x 30 reps bil   Seated Hip Adduction 4 min   Seated Hip Abduction Red 4 min   Seated Ham Curls Red x 30 reps bil   5 STS    TUG    Rows    Extensions     Blank cell = exercise not performed today                                    01/23/24 EXERCISE LOG  Exercise Repetitions and Resistance Comments  Nustep  L4 x 17 minutes   LAQ 3# x 3 minutes    Marching on foam  3 minutes  BUE support  Step up  6" step x 10 reps each  With BUE support  Seated marching 3# x 2.5 minutes    Blank cell = exercise not performed today                  PATIENT EDUCATION:  Education details: progress with physical therapy and benefits of exercise Person educated: Patient Education method: Explanation Education comprehension: verbalized understanding  HOME EXERCISE PROGRAM:   ASSESSMENT:  CLINICAL IMPRESSION:  Pt arrives for today's treatment session denying any pain, but pt does report feeling "tired" today.  Pt able to tolerate increased time with cybex exercises today.  Pt required increased seated rest break today between exercises due to fatigue.  Pt able to tolerate increased reps with selected seated exercises today.  Pt's SpO2 >92% on normal O2 levels throughout treatment session.  Pt denied any pain at completion of today's treatment session.   OBJECTIVE IMPAIRMENTS: Abnormal gait, decreased activity tolerance, decreased balance, decreased mobility, difficulty walking, decreased ROM, decreased strength, and pain.   ACTIVITY LIMITATIONS: carrying, lifting, squatting, stairs, transfers, and locomotion level  PARTICIPATION LIMITATIONS: meal prep, cleaning, laundry, shopping, and community  activity  PERSONAL FACTORS: Age, Past/current experiences, Time since onset of injury/illness/exacerbation, Transportation, and 3+ comorbidities: Hypertension, diabetes type 2, chronic diastolic heart failure, atrial fibrillation, arthritis, osteoporosis, anxiety, depression, and requires portable oxygen   are also affecting patient's functional outcome.   REHAB POTENTIAL: Fair    CLINICAL DECISION MAKING: Evolving/moderate complexity  EVALUATION COMPLEXITY: Moderate   GOALS: Goals reviewed with patient? Yes  SHORT TERM GOALS: Target date: 02/06/24 Patient will be independent with her initial HEP. Baseline: Goal status: MET  2.  Patient will improve her 5 times sit to stand time to 21 seconds or less for improved lower extremity power. Baseline: 2/10: 24.01 seconds; 3/5: 28.81 seconds; 4/9: 19.05 seconds Goal status: MET  3.  Patient will improve her timed up and go time to 21 seconds or less for improved functional mobility. Baseline: 2/10: 27.13;  3/5: 27.38 seconds; 4/9: 20.7 seconds Goal status: MET  LONG TERM GOALS: Target date: 02/27/24  Patient will be independent with her advanced HEP. Baseline:  Goal status: IN PROGRESS  2.  Patient will be able to transfer with minimal to no upper extremity support for improved independence. Baseline:  Goal status: MET  3.  Patient will improve her timed up and go time to 15 seconds or less for improved safety and reduced fall risk. Baseline:  Goal status: IN PROGRESS  4.  Patient will improve her 5 times sit to stand  time to 15 seconds or less to reduce her fall risk. Baseline:  Goal status: IN PROGRESS  PLAN:  PT FREQUENCY: 2x/week  PT DURATION: 6 weeks  PLANNED INTERVENTIONS: 97164- PT Re-evaluation, 97110-Therapeutic exercises, 97530- Therapeutic activity, 97112- Neuromuscular re-education, 97535- Self Care, 40981- Manual therapy, [phone]- Gait training, Patient/Family education, Balance training, Stair training, Joint  mobilization, Cryotherapy, and Moist heat  PLAN FOR NEXT SESSION: Nustep, lower extremity strengthening, gait training, and manual therapy as needed   Deryl Flora, PTA 04/21/2024, 3:38 PM

## 2024-04-23 ENCOUNTER — Encounter: Payer: Self-pay | Admitting: Family Medicine

## 2024-04-23 ENCOUNTER — Ambulatory Visit: Payer: 59 | Admitting: Family Medicine

## 2024-04-23 VITALS — BP 140/77 | HR 87 | Ht 62.0 in | Wt 186.0 lb

## 2024-04-23 DIAGNOSIS — I152 Hypertension secondary to endocrine disorders: Secondary | ICD-10-CM

## 2024-04-23 DIAGNOSIS — E039 Hypothyroidism, unspecified: Secondary | ICD-10-CM | POA: Diagnosis not present

## 2024-04-23 DIAGNOSIS — E7800 Pure hypercholesterolemia, unspecified: Secondary | ICD-10-CM

## 2024-04-23 DIAGNOSIS — E785 Hyperlipidemia, unspecified: Secondary | ICD-10-CM

## 2024-04-23 DIAGNOSIS — E1159 Type 2 diabetes mellitus with other circulatory complications: Secondary | ICD-10-CM

## 2024-04-23 DIAGNOSIS — F339 Major depressive disorder, recurrent, unspecified: Secondary | ICD-10-CM | POA: Diagnosis not present

## 2024-04-23 DIAGNOSIS — E1169 Type 2 diabetes mellitus with other specified complication: Secondary | ICD-10-CM | POA: Diagnosis not present

## 2024-04-23 LAB — BAYER DCA HB A1C WAIVED: HB A1C (BAYER DCA - WAIVED): 5.8 % — ABNORMAL HIGH (ref 4.8–5.6)

## 2024-04-23 MED ORDER — LEVOTHYROXINE SODIUM 100 MCG PO TABS
100.0000 ug | ORAL_TABLET | Freq: Every day | ORAL | 3 refills | Status: AC
Start: 2024-04-23 — End: ?

## 2024-04-23 MED ORDER — SERTRALINE HCL 100 MG PO TABS: 200.0000 mg | ORAL_TABLET | Freq: Every day | ORAL | 3 refills | Status: AC

## 2024-04-23 NOTE — Progress Notes (Signed)
 BP (!) 140/77   Pulse 87   Ht 5\' 2"  (1.575 m)   Wt 186 lb (84.4 kg)   SpO2 91%   BMI 34.02 kg/m    Subjective:   Patient ID: Danielle Parrish, female    DOB: 01-30-1941, 83 y.o.   MRN: [id number]  HPI: Danielle Parrish is a 83 y.o. female presenting on 04/23/2024 for Medical Management of Chronic Issues, Hyperlipidemia, Hypertension, Depression, Hypothyroidism, and Diabetes   HPI Hypertension Patient is currently on amlodipine  and metoprolol  and olmesartan , and their blood pressure today is 140/77. Patient denies any lightheadedness or dizziness. Patient denies headaches, blurred vision, chest pains, shortness of breath, or weakness. Denies any side effects from medication and is content with current medication.   Hyperlipidemia Patient is coming in for recheck of his hyperlipidemia. The patient is currently taking lovastatin . They deny any issues with myalgias or history of liver damage from it. They deny any focal numbness or weakness or chest pain.   Hypothyroidism recheck Patient is coming in for thyroid  recheck today as well. They deny any issues with hair changes or heat or cold problems or diarrhea or constipation. They deny any chest pain or palpitations. They are currently on levothyroxine  100 micrograms   Type 2 diabetes mellitus Patient comes in today for recheck of his diabetes. Patient has been currently taking no medication, diet controlled. Patient is currently on an ACE inhibitor/ARB. Patient has not seen an ophthalmologist this year. Patient denies any new issues with their feet. The symptom started onset as an adult hypertension and hyperlipidemia and hypothyroidism ARE RELATED TO DM   Depression recheck Patient is coming in for depression recheck today.  She currently takes Zoloft  200 mg daily.  Patient feels like her depression is doing okay but she still has a lot of issues with energy.  Lung disease like she cannot get up and get around as much as she would  like to.  She has some aches and pains and inflammation at different joints that limits her as well but a lot of it is from breathing issues.    04/23/2024    3:03 PM 03/27/2024    3:14 PM 03/12/2024    8:59 AM 01/24/2024    2:20 PM 11/01/2023    9:04 AM  Depression screen PHQ 2/9  Decreased Interest 1 1 3 2 2   Down, Depressed, Hopeless 2 1 1 2 2   PHQ - 2 Score 3 2 4 4 4   Altered sleeping 1 1 2 3 2   Tired, decreased energy 3 1 3 3 2   Change in appetite 1 1 2 3 2   Feeling bad or failure about yourself  1 1 2 2 2   Trouble concentrating 1 1 2  0 2  Moving slowly or fidgety/restless 0 0 2 0 1  Suicidal thoughts 0 0 1 0 0  PHQ-9 Score 10 7 18 15 15   Difficult doing work/chores Somewhat difficult Somewhat difficult Somewhat difficult Not difficult at all Somewhat difficult     Relevant past medical, surgical, family and social history reviewed and updated as indicated. Interim medical history since our last visit reviewed. Allergies and medications reviewed and updated.  Review of Systems  Constitutional:  Positive for fatigue. Negative for chills and fever.  HENT:  Negative for congestion, ear discharge and ear pain.   Eyes:  Negative for redness and visual disturbance.  Respiratory:  Negative for chest tightness and shortness of breath.   Cardiovascular:  Negative for chest  pain and leg swelling.  Genitourinary:  Negative for difficulty urinating and dysuria.  Musculoskeletal:  Negative for back pain and gait problem.  Skin:  Negative for rash.  Neurological:  Positive for weakness. Negative for dizziness, light-headedness and headaches.  Psychiatric/Behavioral:  Positive for dysphoric mood. Negative for agitation, behavioral problems, self-injury, sleep disturbance and suicidal ideas. The patient is nervous/anxious.   All other systems reviewed and are negative.   Per HPI unless specifically indicated above   Allergies as of 04/23/2024       Reactions   Nitrofurantoin  Shortness Of  Breath   Likely macrodantin  pulmonary toxicity 06/2022    Nsaids    Burns stomach.    Penicillins Hives, Other (See Comments)   Has patient had a PCN reaction causing immediate rash, facial/tongue/throat swelling, SOB or lightheadedness with hypotension: Yes Has patient had a PCN reaction causing severe rash involving mucus membranes or skin necrosis: unknown Has patient had a PCN reaction that required hospitalization No Has patient had a PCN reaction occurring within the last 10 years: No If all of the above answers are "NO", then may proceed with Cephalosporin use.   Rosuvastatin Other (See Comments)   Body aches.   Statins Other (See Comments)   Vytorin [ezetimibe-simvastatin]    Body aches.    Aspirin Nausea Only   Tolmetin Nausea Only   Burns stomach.         Medication List        Accurate as of April 23, 2024  3:20 PM. If you have any questions, ask your nurse or doctor.          STOP taking these medications    calcium -vitamin D  500-200 MG-UNIT Tabs tablet Commonly known as: OSCAL WITH D Stopped by: Lucio Sabin Haelee Bolen       TAKE these medications    acetaminophen  500 MG tablet Commonly known as: TYLENOL  Take 1 tablet (500 mg total) by mouth every 6 (six) hours as needed.   albuterol  108 (90 Base) MCG/ACT inhaler Commonly known as: VENTOLIN  HFA Take 2 puffs eveery 6 hours as needed for wheezing or shortness of breath   amLODipine  10 MG tablet Commonly known as: NORVASC  Take 1 tablet (10 mg total) by mouth daily.   apixaban  5 MG Tabs tablet Commonly known as: Eliquis  Take 1 tablet (5 mg total) by mouth 2 (two) times daily.   azelastine  0.1 % nasal spray Commonly known as: ASTELIN  Place 1 spray into both nostrils 2 (two) times daily. Use in each nostril as directed   diclofenac  Sodium 1 % Gel Commonly known as: VOLTAREN  APPLY 4 GRAMS TOPICALLY 4 (FOUR) TIMES DAILY.   diphenhydrAMINE 25 MG tablet Commonly known as: BENADRYL Take 50 mg by mouth  at bedtime as needed for allergies.   fluconazole  150 MG tablet Commonly known as: Diflucan  Take one tablet by mouth now. Repeat in 3 days if symptoms persist.   furosemide  40 MG tablet Commonly known as: LASIX  TAKE 1 TABLET BY MOUTH EVERY DAY   gabapentin  300 MG capsule Commonly known as: NEURONTIN  Take 1 capsule (300 mg total) by mouth 3 (three) times daily.   Iron 325 (65 Fe) MG Tabs Take 1 tablet by mouth 3 (three) times daily.   ketoconazole  2 % cream Commonly known as: NIZORAL  APPLY TO AFFECTED AREA EVERYDAY   levothyroxine  100 MCG tablet Commonly known as: SYNTHROID  Take 1 tablet (100 mcg total) by mouth daily before breakfast.   lidocaine  5 % Commonly known as: LIDODERM   PLACE 1 PATCH ONTO THE SKIN DAILY. REMOVE & DISCARD PATCH WITHIN 12 HOURS OR AS DIRECTED BY MD   linaclotide 145 MCG Caps capsule Commonly known as: LINZESS Take 145 mcg by mouth as needed.   lovastatin  40 MG tablet Commonly known as: MEVACOR  Take 1 tablet (40 mg total) by mouth at bedtime.   metoprolol  succinate 25 MG 24 hr tablet Commonly known as: TOPROL -XL Take 1 tablet (25 mg total) by mouth daily.   mirabegron  ER 25 MG Tb24 tablet Commonly known as: Myrbetriq  Take 1 tablet (25 mg total) by mouth daily.   nystatin  powder Commonly known as: MYCOSTATIN /NYSTOP  APPLY TO AFFECTED AREA 4 TIMES A DAY   olmesartan  20 MG tablet Commonly known as: BENICAR  Take 1 tablet (20 mg total) by mouth daily.   omeprazole  20 MG capsule Commonly known as: PRILOSEC Take 1 capsule (20 mg total) by mouth 2 (two) times daily before a meal. What changed: when to take this   PRESERVISION AREDS 2 PO Take by mouth.   pyridOXINE 100 MG tablet Commonly known as: VITAMIN B6 Take 100 mg by mouth daily.   sertraline  100 MG tablet Commonly known as: ZOLOFT  Take 2 tablets (200 mg total) by mouth daily.   sodium chloride  0.65 % Soln nasal spray Commonly known as: OCEAN Place 1 spray into both nostrils as  needed for congestion.   VITAMIN D -3 PO Take 1 capsule by mouth daily.         Objective:   BP (!) 140/77   Pulse 87   Ht 5\' 2"  (1.575 m)   Wt 186 lb (84.4 kg)   SpO2 91%   BMI 34.02 kg/m   Wt Readings from Last 3 Encounters:  04/23/24 186 lb (84.4 kg)  03/27/24 186 lb (84.4 kg)  03/12/24 191 lb 9.6 oz (86.9 kg)    Physical Exam Vitals and nursing note reviewed.  Constitutional:      General: She is not in acute distress.    Appearance: She is well-developed. She is obese. She is not diaphoretic.     Comments: On 4 L nasal cannula  Eyes:     Conjunctiva/sclera: Conjunctivae normal.  Cardiovascular:     Rate and Rhythm: Normal rate and regular rhythm.     Heart sounds: Normal heart sounds. No murmur heard. Pulmonary:     Effort: Pulmonary effort is normal. No respiratory distress.     Breath sounds: Normal breath sounds. No wheezing.  Musculoskeletal:        General: No tenderness. Normal range of motion.  Skin:    General: Skin is warm and dry.     Findings: No rash.  Neurological:     Mental Status: She is alert and oriented to person, place, and time.     Coordination: Coordination normal.  Psychiatric:        Behavior: Behavior normal.       Assessment & Plan:   Problem List Items Addressed This Visit       Cardiovascular and Mediastinum   Hypertension associated with type 2 diabetes mellitus (HCC) (Chronic)     Endocrine   Hyperlipidemia associated with type 2 diabetes mellitus (HCC) (Chronic)   Type 2 diabetes mellitus with hypercholesterolemia (HCC) - Primary   Relevant Orders   Bayer DCA Hb A1c Waived   Hypothyroidism   Relevant Medications   levothyroxine  (SYNTHROID ) 100 MCG tablet     Other   Depression, recurrent (HCC)   Relevant Medications   sertraline  (ZOLOFT )  100 MG tablet    A1c looks good at 5.8.  Lung disease seem to be stable but her energy is down because of the lung disease.  Continue to do what she can. Follow up  plan: Return in about 3 months (around 07/23/2024), or if symptoms worsen or fail to improve, for Diabetes and thyroid  and hypertension and depression..  Counseling provided for all of the vaccine components Orders Placed This Encounter  Procedures   Bayer [facility] Hb A1c Waived    Jolyne Needs, MD [facility] 04/23/2024, 3:20 PM

## 2024-04-24 ENCOUNTER — Encounter: Payer: Self-pay | Admitting: Nurse Practitioner

## 2024-04-24 ENCOUNTER — Ambulatory Visit: Payer: 59 | Attending: Nurse Practitioner | Admitting: Nurse Practitioner

## 2024-04-24 ENCOUNTER — Encounter

## 2024-04-24 VITALS — BP 138/84 | HR 72 | Ht 62.0 in | Wt 188.8 lb

## 2024-04-24 DIAGNOSIS — I10 Essential (primary) hypertension: Secondary | ICD-10-CM

## 2024-04-24 DIAGNOSIS — I5032 Chronic diastolic (congestive) heart failure: Secondary | ICD-10-CM

## 2024-04-24 DIAGNOSIS — G4733 Obstructive sleep apnea (adult) (pediatric): Secondary | ICD-10-CM

## 2024-04-24 DIAGNOSIS — E669 Obesity, unspecified: Secondary | ICD-10-CM

## 2024-04-24 DIAGNOSIS — J9611 Chronic respiratory failure with hypoxia: Secondary | ICD-10-CM | POA: Diagnosis not present

## 2024-04-24 DIAGNOSIS — R5383 Other fatigue: Secondary | ICD-10-CM

## 2024-04-24 DIAGNOSIS — I350 Nonrheumatic aortic (valve) stenosis: Secondary | ICD-10-CM

## 2024-04-24 DIAGNOSIS — E785 Hyperlipidemia, unspecified: Secondary | ICD-10-CM

## 2024-04-24 DIAGNOSIS — I4891 Unspecified atrial fibrillation: Secondary | ICD-10-CM | POA: Diagnosis not present

## 2024-04-24 MED ORDER — MAGNESIUM OXIDE -MG SUPPLEMENT 400 (240 MG) MG PO TABS: 400.0000 mg | ORAL_TABLET | Freq: Every day | ORAL | 1 refills | Status: AC

## 2024-04-24 NOTE — Progress Notes (Unsigned)
 Cardiology Office Note:  .   Date: 04/24/2024 ID:  Danielle Parrish, DOB 1941-05-28, MRN [id number] PCP: Dettinger, Lucio Sabin, MD  [facility] Providers Cardiologist:  Lasalle Pointer, MD    History of Present Illness: .   Danielle Parrish is a 83 y.o. female with a PMH of PAF, hypertension, hyperlipidemia, type 2 diabetes, chronic hypoxic respiratory failure on oxygen , OSA on CPAP, who presents today for scheduled follow-up appointment.  Newly diagnosed with A-fib in June 2024 while in the pulmonology office.  She was sent to the ED, spontaneously converted to normal sinus rhythm.  Last seen by Dr. Arthea Larsson on September 07, 2023.  She was overall doing well at the time, but pt did note symptomatic DOE.  2-week event monitor was arranged to rule out any intermittent episodes of A-fib.  Lasix  was increased to 40 mg daily.  01/15/2024 - Today she presents for 3 month follow-up. She states she never received the heart monitor after last office visit/wore a heart monitor. She notes recent stress over the past few months. Daughter passed away recently and holiday season was difficult she states, denies any red flag signs/symptoms. Says overall she is doing fairly well. States when she takes off her O2 temporarily, she notes her SpO2 saturation and HR dropping but then both return to normal after putting her O2 back on. Denies any chest pain, palpitations, syncope, presyncope, dizziness, orthopnea, PND, swelling or significant weight changes, acute bleeding, or claudication. Denies any recent worsening SHOB. Tells she she has recently joined an exercise class and enjoying this very much, also will be starting PT soon.  02/18/2024 - Presents today for follow-up. Admits to fatigue, unsure what this is due to.  She confirms compliance with CPAP. Denies any chest pain, worsening shortness of breath, palpitations, syncope, presyncope, dizziness, orthopnea, PND, swelling or significant weight  changes, acute bleeding, or claudication.  ED visit on 02/28/2024 at [facility] ED for fatigue. Labwork overall unremarkable. See most recent labs noted below.   Today she presents for follow-up. She states she still is noticing fatigue.  Confirms compliance with CPAP. Denies any chest pain, worsening shortness of breath, palpitations, syncope, presyncope, dizziness, orthopnea, PND, swelling or significant weight changes, acute bleeding, or claudication.  According to her report, this fatigue has been intermittent 2 to 3 years.  She particularly says fatigue is better today than it was yesterday.  02/28/2024 labs:  Cardiac Troponin T: 19, 16, 17 CBC: WBC 3.8, normal H/H CMET: unremarkable Mag: 1.5 proBNP: WNL TSH: WNL Respiratory viral panel: Negative   ROS: Negative. See HPI.   Studies Reviewed: Aaron Aas    EKG: EKG is not ordered today.  Echo 02/2024:  1. Left ventricular ejection fraction, by estimation, is 55 to 60%. Left  ventricular ejection fraction by 3D volume is 56 %. The left ventricle has  normal function. Left ventricular endocardial border not optimally defined to evaluate regional wall  motion. Left ventricular diastolic parameters are consistent with Grade I  diastolic dysfunction (impaired relaxation). The average left ventricular  global longitudinal strain is -14.3 %.   2. Right ventricular systolic function is normal. The right ventricular  size is normal. Tricuspid regurgitation signal is inadequate for assessing  PA pressure.   3. The mitral valve is normal in structure. No evidence of mitral valve  regurgitation. No evidence of mitral stenosis.   4. The aortic valve has an indeterminant number of cusps. Aortic valve  regurgitation is not visualized. Mild aortic  valve stenosis. Aortic valve  mean gradient measures 8.0 mmHg. Aortic valve Vmax measures 2.02 m/s.   5. The inferior vena cava is normal in size with greater than 50%  respiratory variability, suggesting  right atrial pressure of 3 mmHg.   6. Increased flow velocities may be secondary to anemia, thyrotoxicosis,  hyperdynamic or high flow state.   Comparison(s): A prior study was performed on 07/11/2022. Changes from prior study are noted. AI is not visualized.  Cardiac monitor 01/2024:   Patch wear time was for 13 days and 23 hours.   Normal sinus rhythm predominantly ranging from 38 to 203 bpm with an average HR 65 bpm.   44 runs of SVT, fastest interval lasting 11 beats/3.6 seconds and the longest interval lasting 24 beats/12 seconds.   3 runs of NSVT, fastest interval lasting 4 beats and the longest interval lasting 6 beats.   No evidence of high-grade AV block or pauses.   <1% PAC burden and <1% PVC burden.   No patient triggered events noted.  Echo 06/2022:   1. Left ventricular ejection fraction, by estimation, is 60 to 65%. The  left ventricle has normal function. The left ventricle has no regional  wall motion abnormalities. There is mild left ventricular hypertrophy.  Left ventricular diastolic parameters  are consistent with Grade I diastolic dysfunction (impaired relaxation).  The average left ventricular global longitudinal strain is -19.2 %. The  global longitudinal strain is normal.   2. Right ventricular systolic function is low normal. The right  ventricular size is normal. There is normal pulmonary artery systolic  pressure. The estimated right ventricular systolic pressure is 7.2 mmHg.   3. The mitral valve is degenerative. Trivial mitral valve regurgitation.   4. The aortic valve is tricuspid. There is mild calcification of the  aortic valve. Aortic valve regurgitation is mild. Mild aortic valve  stenosis. Aortic regurgitation PHT measures 1034 msec. Aortic valve area,  by VTI measures 1.86 cm. Aortic valve  mean gradient measures 9.0 mmHg.   5. The inferior vena cava is normal in size with greater than 50%  respiratory variability, suggesting right atrial pressure of  3 mmHg.   Comparison(s): Prior images unable to be directly viewed.  Holter monitor 09/2015:  48-hour Holter monitor reviewed. Sinus rhythm present with heart rate ranging from 36 bpm just briefly up to 90 bpm, average heart rate 56 bpm. Lowest sustained heart rate was in the mid 40s. Occasional PACs and PVCs were noted. Brief bursts of PAT ranging 5-7 beats also noted. There were no significant pauses, no atrial fibrillation. No obvious correlating symptoms described.   Physical Exam:   VS:  BP 138/84   Pulse 72   Ht 5\' 2"  (1.575 m)   Wt 188 lb 12.8 oz (85.6 kg)   SpO2 97%   BMI 34.53 kg/m    Wt Readings from Last 3 Encounters:  04/24/24 188 lb 12.8 oz (85.6 kg)  04/23/24 186 lb (84.4 kg)  03/27/24 186 lb (84.4 kg)    GEN: Obese, 83 y.o. female in no acute distress, wearing chronic O2 NECK: No JVD; No carotid bruits CARDIAC: S1/S2, RRR, no murmurs, rubs, gallops RESPIRATORY:  Clear to auscultation without rales, wheezing or rhonchi  ABDOMEN: Soft, non-tender, non-distended EXTREMITIES:  No edema; No deformity   ASSESSMENT AND PLAN: .    A-fib Denies any tachycardia or palpitations.  Most recent monitor revealed normal sinus rhythm with average heart rate of 65 bpm.  Had occasional runs  of SVT that were brief in duration.  3 runs of NSVT, longest episode lasted 6 beats.  No evidence of high-grade AV blocks or pauses.  Continue Toprol -XL.Continue Eliquis  5 mg BID for stroke prevention, denies any bleeding issues. On appropriate dosage. Continue Toprol  XL.   HTN Blood pressure stable and well-controlled. Discussed to monitor BP at home at least 2 hours after medications and sitting for 5-10 minutes.  No medication changes at this time. Heart healthy diet and regular cardiovascular exercise encouraged.   HLD LDL 73 03/2023.  This is being managed by PCP.  Continue lovastatin . Heart healthy diet and regular cardiovascular exercise encouraged.  Will request most recent labs from PCP.    4. Aortic valve stenosis Mild aortic valve stenosis noted on most recent echocardiogram with mean gradient measuring 8.0 mmHg.  This has been stable since 2023.  Denies any concerning signs or symptoms.  Recommend updating echocardiogram in 1 year for monitoring.  5. Chronic hypoxic respiratory failure on oxygen , OSA on CPAP Denies any worsening symptoms.  Breathing is stable per her report.  She is compliant with CPAP.  Continue to follow-up with PCP and Dr. Waymond Hailey.   6. Obesity Weight loss via diet and exercise encouraged. Discussed the impact being overweight would have on cardiovascular risk.    7. Fatigue, low Magnesium  level Etiology unclear and multifactorial. Her most recent Echo does not explain her symptoms.  Instructed her to begin magnesium  oxide 400 mg daily due to recently seen low magnesium  level.  Will recheck magnesium  1 to 2 weeks per protocol.  Discussed ischemic evaluation, and patient declines at this time.  Came to shared medical decision with patient we will continue to monitor at this time.  Continue follow-up with PCP. Care and ED precautions discussed.    Dispo: Follow-up with me/APP in 3-4 months or sooner anything changes.  Signed, Lasalle Pointer, NP

## 2024-04-24 NOTE — Patient Instructions (Addendum)
 Medication Instructions:  Your physician has recommended you make the following change in your medication:  Please Start Magnesium  Oxide 400 Mg daily   Labwork: In 1-2 weeks at [facility]   Testing/Procedures: None   Follow-Up: Your physician recommends that you schedule a follow-up appointment in: 3-4 months   Any Other Special Instructions Will Be Listed Below (If Applicable).  If you need a refill on your cardiac medications before your next appointment, please call your pharmacy.

## 2024-04-25 ENCOUNTER — Ambulatory Visit: Attending: Orthopedic Surgery

## 2024-04-25 DIAGNOSIS — R2681 Unsteadiness on feet: Secondary | ICD-10-CM | POA: Insufficient documentation

## 2024-04-25 DIAGNOSIS — M25562 Pain in left knee: Secondary | ICD-10-CM | POA: Insufficient documentation

## 2024-04-25 DIAGNOSIS — M25561 Pain in right knee: Secondary | ICD-10-CM | POA: Insufficient documentation

## 2024-04-25 DIAGNOSIS — G8929 Other chronic pain: Secondary | ICD-10-CM | POA: Diagnosis not present

## 2024-04-25 DIAGNOSIS — M545 Low back pain, unspecified: Secondary | ICD-10-CM | POA: Insufficient documentation

## 2024-04-25 NOTE — Therapy (Signed)
 OUTPATIENT PHYSICAL THERAPY LOWER EXTREMITY TREATMENT   Patient Name: Danielle Parrish MRN: [id number] DOB:1941-11-23, 83 y.o., female Today's Date: 04/25/2024  END OF SESSION:  PT End of Session - 04/25/24 1108     Visit Number 15    Number of Visits 18    Date for PT Re-Evaluation 05/02/24    PT Start Time 1104    PT Stop Time 1150    PT Time Calculation (min) 46 min    Equipment Utilized During Treatment Oxygen     Activity Tolerance Patient tolerated treatment well    Behavior During Therapy WFL for tasks assessed/performed              Past Medical History:  Diagnosis Date   Anxiety    Arthritis    CHF (congestive heart failure) (HCC)    Chronic pain of multiple joints    Depression    Essential hypertension    GERD (gastroesophageal reflux disease)    Hyperlipidemia    Hyperlipidemia associated with type 2 diabetes mellitus (HCC)    Hypertension associated with type 2 diabetes mellitus (HCC)    Hypothyroidism    Mild scoliosis 10/19/2021   Osteoporosis    Sleep apnea    CPAP   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, left 09/05/2023   left wrist   needs excision   Squamous cell carcinoma of skin 09/05/2023   left ear   needs mohs   Type 2 diabetes mellitus with hypercholesterolemia (HCC)    Vitamin D  deficiency    Past Surgical History:  Procedure Laterality Date   ABDOMINAL HERNIA REPAIR     ABDOMINAL HYSTERECTOMY  1977   BREAST EXCISIONAL BIOPSY Right    EYE SURGERY  2020   bilateral cataract removal    KNEE ARTHROSCOPY Left 09/30/2014   Procedure: LEFT KNEE ARTHROSCOPY MEDIAL MENISECTOMY ABRASION CHONDROPLASTY SYNOVECTOMY SUPRAPATELLER CUFF;  Surgeon: Florencia Hunter, MD;  Location: [facility];  Service: Orthopedics;  Laterality: Left;   Patient Active Problem List   Diagnosis Date Noted   Hospital discharge follow-up 03/13/2024   Pulmonary hypertension, primary (HCC) 03/13/2024   Chronic anticoagulation 09/07/2023   Paroxysmal atrial fibrillation  (HCC) 06/19/2023   Hypercoagulable state due to paroxysmal atrial fibrillation (HCC) 06/19/2023   Chronic respiratory failure with hypoxia (HCC) 07/10/2022   Shortness of breath 07/04/2022   Chronic midline low back pain without sciatica 01/30/2019   Age-related osteoporosis with current pathol fracture of vertebra (HCC) 10/28/2018   Calcification of aorta (HCC) 10/02/2018   Compression fracture of T12 vertebra (HCC) 10/01/2018   Primary osteoarthritis of right knee 07/20/2017   Varicose veins of left lower extremity with complications 07/31/2016   OSA (obstructive sleep apnea) 09/14/2015   Chronic diastolic heart failure (HCC) 09/10/2015   Dysphagia, pharyngoesophageal phase 08/04/2015   Anorexia 08/04/2015   Malaise and fatigue 08/04/2015   Depression, recurrent (HCC) 04/08/2015   Vitamin D  deficiency    Anxiety    GERD (gastroesophageal reflux disease)    Arthritis    Hypertension associated with type 2 diabetes mellitus (HCC) 05/15/2013   Hyperlipidemia associated with type 2 diabetes mellitus (HCC) 05/15/2013   Type 2 diabetes mellitus with hypercholesterolemia (HCC) 05/15/2013   Morbid obesity (HCC) 05/15/2013   Hypothyroidism 05/15/2013    PCP: Dettinger, Lucio Sabin, MD  REFERRING PROVIDER: Hazle Lites, MD   REFERRING DIAG: Right knee pain, Left knee pain   THERAPY DIAG:  Unsteadiness on feet  Chronic pain of right knee  Chronic pain  of left knee  Chronic bilateral low back pain without sciatica  Rationale for Evaluation and Treatment: Rehabilitation  ONSET DATE: "years ago"   SUBJECTIVE:   SUBJECTIVE STATEMENT: Pt denies any pain today.  Pt states "I am feeling better than I did the other day."  PERTINENT HISTORY: Hypertension, diabetes type 2, chronic diastolic heart failure, atrial fibrillation, arthritis, osteoporosis, anxiety, depression, and requires portable oxygen  PAIN:  Are you having pain? No  PRECAUTIONS: None  RED FLAGS: None   WEIGHT  BEARING RESTRICTIONS: No  FALLS:  Has patient fallen in last 6 months? No  LIVING ENVIRONMENT: Lives with: lives with their family Lives in: House/apartment Stairs: Yes: Internal: 20 steps; on right going up; step to pattern Has following equipment at home: Single point cane (patient only uses the cane when she is away from home)   OCCUPATION: retired  PLOF: Independent  PATIENT GOALS: walk better  NEXT MD VISIT: none scheduled  OBJECTIVE:  Note: Objective measures were completed at Evaluation unless otherwise noted.  COGNITION: Overall cognitive status: Within functional limits for tasks assessed     SENSATION: Patient reports no numbness or tingling  EDEMA:  No significant edema observed  POSTURE: rounded shoulders, forward head, decreased lumbar lordosis, and increased thoracic kyphosis  PALPATION: No tenderness to palpation reported  LOWER EXTREMITY ROM: assessed in sitting  Active ROM Right eval Left eval  Hip flexion    Hip extension    Hip abduction    Hip adduction    Hip internal rotation    Hip external rotation    Knee flexion 117 107  Knee extension 6 11  Ankle dorsiflexion    Ankle plantarflexion    Ankle inversion    Ankle eversion     (Blank rows = not tested)  LOWER EXTREMITY MMT:  MMT Right eval Left eval  Hip flexion 4-/5 4+/5  Hip extension    Hip abduction    Hip adduction    Hip internal rotation    Hip external rotation    Knee flexion 4+/5 4+/5  Knee extension 4+/5 5/5  Ankle dorsiflexion 4/5 4/5  Ankle plantarflexion    Ankle inversion    Ankle eversion     (Blank rows = not tested)  FUNCTIONAL TESTS:  5 times sit to stand: 28.17 seconds with single armrest and SPC  Timed up and go (TUG): 28.32 seconds with SPC and oxygen   GAIT: Assistive device utilized: Single point cane Level of assistance: Modified independence Comments: Decreased gait speed and stride length with poor foot clearance, heel strike, and toe off  bilaterally                                                                                                                                TREATMENT DATE:  04/25/24    EXERCISE LOG  Exercise Repetitions and Resistance Comments  Nustep Lvl 3 x 15 mins   Cybex Knee Flexion 30# x 4 mins   Cybex Knee Extension 10# x 4 mins   Rockerboard  4 mins   LAQs    Seated Marches 4# x 25 reps bil   Seated Hip Adduction 4 min   Seated Hip Abduction Green x 3  min   Seated Ham Curls Green x 20 reps bil   5 STS    TUG    Rows    Extensions     Blank cell = exercise not performed today                                    01/23/24 EXERCISE LOG  Exercise Repetitions and Resistance Comments  Nustep  L4 x 17 minutes   LAQ 3# x 3 minutes    Marching on foam  3 minutes  BUE support  Step up  6" step x 10 reps each  With BUE support  Seated marching 3# x 2.5 minutes    Blank cell = exercise not performed today                  PATIENT EDUCATION:  Education details: progress with physical therapy and benefits of exercise Person educated: Patient Education method: Explanation Education comprehension: verbalized understanding  HOME EXERCISE PROGRAM:   ASSESSMENT:  CLINICAL IMPRESSION:  Pt arrives for today's treatment session denying any pain.  Pt able to tolerate increased time with all cybex exercises today.  Pt also able to tolerate increased resistance or reps with several seated exercises.  Pt SpO2 >93% on normal O2 throughout treatment session.  Pt denied any pain at completion of today's treatment session.   OBJECTIVE IMPAIRMENTS: Abnormal gait, decreased activity tolerance, decreased balance, decreased mobility, difficulty walking, decreased ROM, decreased strength, and pain.   ACTIVITY LIMITATIONS: carrying, lifting, squatting, stairs, transfers, and locomotion level  PARTICIPATION LIMITATIONS: meal prep, cleaning, laundry, shopping, and community  activity  PERSONAL FACTORS: Age, Past/current experiences, Time since onset of injury/illness/exacerbation, Transportation, and 3+ comorbidities: Hypertension, diabetes type 2, chronic diastolic heart failure, atrial fibrillation, arthritis, osteoporosis, anxiety, depression, and requires portable oxygen   are also affecting patient's functional outcome.   REHAB POTENTIAL: Fair    CLINICAL DECISION MAKING: Evolving/moderate complexity  EVALUATION COMPLEXITY: Moderate   GOALS: Goals reviewed with patient? Yes  SHORT TERM GOALS: Target date: 02/06/24 Patient will be independent with her initial HEP. Baseline: Goal status: MET  2.  Patient will improve her 5 times sit to stand time to 21 seconds or less for improved lower extremity power. Baseline: 2/10: 24.01 seconds; 3/5: 28.81 seconds; 4/9: 19.05 seconds Goal status: MET  3.  Patient will improve her timed up and go time to 21 seconds or less for improved functional mobility. Baseline: 2/10: 27.13;  3/5: 27.38 seconds; 4/9: 20.7 seconds Goal status: MET  LONG TERM GOALS: Target date: 02/27/24  Patient will be independent with her advanced HEP. Baseline:  Goal status: IN PROGRESS  2.  Patient will be able to transfer with minimal to no upper extremity support for improved independence. Baseline:  Goal status: MET  3.  Patient will improve her timed up and go time to 15 seconds or less for improved safety and reduced fall risk. Baseline:  Goal status: IN PROGRESS  4.  Patient will improve her  5 times sit to stand time to 15 seconds or less to reduce her fall risk. Baseline:  Goal status: IN PROGRESS  PLAN:  PT FREQUENCY: 2x/week  PT DURATION: 6 weeks  PLANNED INTERVENTIONS: 97164- PT Re-evaluation, 97110-Therapeutic exercises, 97530- Therapeutic activity, 97112- Neuromuscular re-education, 97535- Self Care, 75643- Manual therapy, [phone]- Gait training, Patient/Family education, Balance training, Stair training, Joint  mobilization, Cryotherapy, and Moist heat  PLAN FOR NEXT SESSION: Nustep, lower extremity strengthening, gait training, and manual therapy as needed   Deryl Flora, PTA 04/25/2024, 12:26 PM

## 2024-05-01 ENCOUNTER — Ambulatory Visit

## 2024-05-01 DIAGNOSIS — M25561 Pain in right knee: Secondary | ICD-10-CM | POA: Diagnosis not present

## 2024-05-01 DIAGNOSIS — G8929 Other chronic pain: Secondary | ICD-10-CM

## 2024-05-01 DIAGNOSIS — R2681 Unsteadiness on feet: Secondary | ICD-10-CM | POA: Diagnosis not present

## 2024-05-01 DIAGNOSIS — M545 Low back pain, unspecified: Secondary | ICD-10-CM | POA: Diagnosis not present

## 2024-05-01 DIAGNOSIS — M25562 Pain in left knee: Secondary | ICD-10-CM | POA: Diagnosis not present

## 2024-05-01 NOTE — Therapy (Addendum)
 OUTPATIENT PHYSICAL THERAPY LOWER EXTREMITY TREATMENT   Patient Name: Danielle Parrish MRN: [id number] DOB:1941-10-05, 83 y.o., female Today's Date: 05/01/2024  END OF SESSION:  PT End of Session - 05/01/24 1057     Visit Number 16    Number of Visits 18    Date for PT Re-Evaluation 05/02/24    PT Start Time 1040    PT Stop Time 1121    PT Time Calculation (min) 41 min    Equipment Utilized During Treatment Oxygen     Activity Tolerance Patient tolerated treatment well    Behavior During Therapy WFL for tasks assessed/performed              Past Medical History:  Diagnosis Date   Anxiety    Arthritis    CHF (congestive heart failure) (HCC)    Chronic pain of multiple joints    Depression    Essential hypertension    GERD (gastroesophageal reflux disease)    Hyperlipidemia    Hyperlipidemia associated with type 2 diabetes mellitus (HCC)    Hypertension associated with type 2 diabetes mellitus (HCC)    Hypothyroidism    Mild scoliosis 10/19/2021   Osteoporosis    Sleep apnea    CPAP   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, left 09/05/2023   left wrist   needs excision   Squamous cell carcinoma of skin 09/05/2023   left ear   needs mohs   Type 2 diabetes mellitus with hypercholesterolemia (HCC)    Vitamin D  deficiency    Past Surgical History:  Procedure Laterality Date   ABDOMINAL HERNIA REPAIR     ABDOMINAL HYSTERECTOMY  1977   BREAST EXCISIONAL BIOPSY Right    EYE SURGERY  2020   bilateral cataract removal    KNEE ARTHROSCOPY Left 09/30/2014   Procedure: LEFT KNEE ARTHROSCOPY MEDIAL MENISECTOMY ABRASION CHONDROPLASTY SYNOVECTOMY SUPRAPATELLER CUFF;  Surgeon: Florencia Hunter, MD;  Location: [facility];  Service: Orthopedics;  Laterality: Left;   Patient Active Problem List   Diagnosis Date Noted   Hospital discharge follow-up 03/13/2024   Pulmonary hypertension, primary (HCC) 03/13/2024   Chronic anticoagulation 09/07/2023   Paroxysmal atrial fibrillation  (HCC) 06/19/2023   Hypercoagulable state due to paroxysmal atrial fibrillation (HCC) 06/19/2023   Chronic respiratory failure with hypoxia (HCC) 07/10/2022   Shortness of breath 07/04/2022   Chronic midline low back pain without sciatica 01/30/2019   Age-related osteoporosis with current pathol fracture of vertebra (HCC) 10/28/2018   Calcification of aorta (HCC) 10/02/2018   Compression fracture of T12 vertebra (HCC) 10/01/2018   Primary osteoarthritis of right knee 07/20/2017   Varicose veins of left lower extremity with complications 07/31/2016   OSA (obstructive sleep apnea) 09/14/2015   Chronic diastolic heart failure (HCC) 09/10/2015   Dysphagia, pharyngoesophageal phase 08/04/2015   Anorexia 08/04/2015   Malaise and fatigue 08/04/2015   Depression, recurrent (HCC) 04/08/2015   Vitamin D  deficiency    Anxiety    GERD (gastroesophageal reflux disease)    Arthritis    Hypertension associated with type 2 diabetes mellitus (HCC) 05/15/2013   Hyperlipidemia associated with type 2 diabetes mellitus (HCC) 05/15/2013   Type 2 diabetes mellitus with hypercholesterolemia (HCC) 05/15/2013   Morbid obesity (HCC) 05/15/2013   Hypothyroidism 05/15/2013    PCP: Dettinger, Lucio Sabin, MD  REFERRING PROVIDER: Hazle Lites, MD   REFERRING DIAG: Right knee pain, Left knee pain   THERAPY DIAG:  Unsteadiness on feet  Chronic pain of right knee  Chronic pain  of left knee  Chronic bilateral low back pain without sciatica  Rationale for Evaluation and Treatment: Rehabilitation  ONSET DATE: "years ago"   SUBJECTIVE:   SUBJECTIVE STATEMENT: Pt denies any pain today.  Pt states she feels tired today due to house work performed yesterday.  PERTINENT HISTORY: Hypertension, diabetes type 2, chronic diastolic heart failure, atrial fibrillation, arthritis, osteoporosis, anxiety, depression, and requires portable oxygen  PAIN:  Are you having pain? No  PRECAUTIONS: None  RED  FLAGS: None   WEIGHT BEARING RESTRICTIONS: No  FALLS:  Has patient fallen in last 6 months? No  LIVING ENVIRONMENT: Lives with: lives with their family Lives in: House/apartment Stairs: Yes: Internal: 20 steps; on right going up; step to pattern Has following equipment at home: Single point cane (patient only uses the cane when she is away from home)   OCCUPATION: retired  PLOF: Independent  PATIENT GOALS: walk better  NEXT MD VISIT: none scheduled  OBJECTIVE:  Note: Objective measures were completed at Evaluation unless otherwise noted.  COGNITION: Overall cognitive status: Within functional limits for tasks assessed     SENSATION: Patient reports no numbness or tingling  EDEMA:  No significant edema observed  POSTURE: rounded shoulders, forward head, decreased lumbar lordosis, and increased thoracic kyphosis  PALPATION: No tenderness to palpation reported  LOWER EXTREMITY ROM: assessed in sitting  Active ROM Right eval Left eval  Hip flexion    Hip extension    Hip abduction    Hip adduction    Hip internal rotation    Hip external rotation    Knee flexion 117 107  Knee extension 6 11  Ankle dorsiflexion    Ankle plantarflexion    Ankle inversion    Ankle eversion     (Blank rows = not tested)  LOWER EXTREMITY MMT:  MMT Right eval Left eval  Hip flexion 4-/5 4+/5  Hip extension    Hip abduction    Hip adduction    Hip internal rotation    Hip external rotation    Knee flexion 4+/5 4+/5  Knee extension 4+/5 5/5  Ankle dorsiflexion 4/5 4/5  Ankle plantarflexion    Ankle inversion    Ankle eversion     (Blank rows = not tested)  FUNCTIONAL TESTS:  5 times sit to stand: 28.17 seconds with single armrest and SPC  Timed up and go (TUG): 28.32 seconds with SPC and oxygen   GAIT: Assistive device utilized: Single point cane Level of assistance: Modified independence Comments: Decreased gait speed and stride length with poor foot clearance,  heel strike, and toe off bilaterally                                                                                                                                TREATMENT DATE:  04/25/24    EXERCISE LOG  Exercise Repetitions and Resistance Comments  Nustep Lvl 3 x 15 mins   Cybex Knee Flexion 30# x 4 mins   Cybex Knee Extension 10# x 4 mins   Rockerboard  4 mins   LAQs    Seated Marches 4# x 25 reps bil   Seated Hip Adduction 4 min   Seated Hip Abduction Green x 3  min   Seated Ham Curls Green x 20 reps bil   5 STS    TUG    Rows    Extensions     Blank cell = exercise not performed today                                    01/23/24 EXERCISE LOG  Exercise Repetitions and Resistance Comments  Nustep  L4 x 17 minutes   LAQ 3# x 3 minutes    Marching on foam  3 minutes  BUE support  Step up  6" step x 10 reps each  With BUE support  Seated marching 3# x 2.5 minutes    Blank cell = exercise not performed today                  PATIENT EDUCATION:  Education details: progress with physical therapy and benefits of exercise Person educated: Patient Education method: Explanation Education comprehension: verbalized understanding  HOME EXERCISE PROGRAM:   ASSESSMENT:  CLINICAL IMPRESSION: Pt arrives for today's treatment session denying any pain. Pt reports increased fatigue today due to house work performed yesterday.  Due to patients increased fatigue today, tests scores do not indicate pt's progress accurately.  Pt able to tolerate increased time on cybex today with minimal fatigue.  Pt's SpO2 monitored throughout treatment session, with all measurements within normal limits.  Pt denied any pain at completion of today's treatment session.   05/02/24 PROGRESS REPORT:  Patient is making good progress with skilled physical therapy as evidenced by her functional mobility, objective measures, and progress toward her goals. She has met all of her short  term goals for physical therapy. However, she has yet to meet her long term goals to reduce her fall risk. Recommend that she continue with her current plan of care to address her remaining impairments to maximize her functional mobility.   Glendora Landsman, PT, DPT   OBJECTIVE IMPAIRMENTS: Abnormal gait, decreased activity tolerance, decreased balance, decreased mobility, difficulty walking, decreased ROM, decreased strength, and pain.   ACTIVITY LIMITATIONS: carrying, lifting, squatting, stairs, transfers, and locomotion level  PARTICIPATION LIMITATIONS: meal prep, cleaning, laundry, shopping, and community activity  PERSONAL FACTORS: Age, Past/current experiences, Time since onset of injury/illness/exacerbation, Transportation, and 3+ comorbidities: Hypertension, diabetes type 2, chronic diastolic heart failure, atrial fibrillation, arthritis, osteoporosis, anxiety, depression, and requires portable oxygen  are also affecting patient's functional outcome.   REHAB POTENTIAL: Fair    CLINICAL DECISION MAKING: Evolving/moderate complexity  EVALUATION COMPLEXITY: Moderate   GOALS: Goals reviewed with patient? Yes  SHORT TERM GOALS: Target date: 02/06/24 Patient will be independent with her initial HEP. Baseline: Goal status: MET  2.  Patient will improve her 5 times sit to stand time to 21 seconds or less for improved lower extremity power. Baseline: 2/10: 24.01 seconds; 3/5: 28.81 seconds; 4/9: 19.05 seconds Goal status: MET  3.  Patient will improve her timed up and go time to 21 seconds or less for improved functional mobility. Baseline:  2/10: 27.13;  3/5: 27.38 seconds; 4/9: 20.7 seconds Goal status: MET  LONG TERM GOALS: Target date: 02/27/24  Patient will be independent with her advanced HEP. Baseline:  Goal status: IN PROGRESS  2.  Patient will be able to transfer with minimal to no upper extremity support for improved independence. Baseline:  Goal status: MET  3.  Patient  will improve her timed up and go time to 15 seconds or less for improved safety and reduced fall risk. Baseline:  Goal status: IN PROGRESS  4.  Patient will improve her 5 times sit to stand time to 15 seconds or less to reduce her fall risk. Baseline:  Goal status: IN PROGRESS  PLAN:  PT FREQUENCY: 2x/week  PT DURATION: 6 weeks  PLANNED INTERVENTIONS: 97164- PT Re-evaluation, 97110-Therapeutic exercises, 97530- Therapeutic activity, 97112- Neuromuscular re-education, 97535- Self Care, 14782- Manual therapy, [phone]- Gait training, Patient/Family education, Balance training, Stair training, Joint mobilization, Cryotherapy, and Moist heat  PLAN FOR NEXT SESSION: Nustep, lower extremity strengthening, gait training, and manual therapy as needed   Deryl Flora, PTA 05/01/2024, 11:46 AM

## 2024-05-02 ENCOUNTER — Encounter

## 2024-05-02 NOTE — Addendum Note (Signed)
 Addended by: Lane Pinon on: 05/02/2024 09:17 AM   Modules accepted: Orders

## 2024-05-07 ENCOUNTER — Ambulatory Visit

## 2024-05-07 DIAGNOSIS — G8929 Other chronic pain: Secondary | ICD-10-CM | POA: Diagnosis not present

## 2024-05-07 DIAGNOSIS — M25561 Pain in right knee: Secondary | ICD-10-CM | POA: Diagnosis not present

## 2024-05-07 DIAGNOSIS — M25562 Pain in left knee: Secondary | ICD-10-CM | POA: Diagnosis not present

## 2024-05-07 DIAGNOSIS — M545 Low back pain, unspecified: Secondary | ICD-10-CM | POA: Diagnosis not present

## 2024-05-07 DIAGNOSIS — R2681 Unsteadiness on feet: Secondary | ICD-10-CM | POA: Diagnosis not present

## 2024-05-07 NOTE — Therapy (Signed)
 OUTPATIENT PHYSICAL THERAPY LOWER EXTREMITY TREATMENT   Patient Name: Danielle Parrish MRN: [id number] DOB:Sep 29, 1941, 83 y.o., female Today's Date: 05/07/2024  END OF SESSION:  PT End of Session - 05/07/24 1557     Visit Number 17    Number of Visits 18    Date for PT Re-Evaluation 05/23/24    PT Start Time 1553    PT Stop Time 1644    PT Time Calculation (min) 51 min    Equipment Utilized During Treatment Oxygen     Activity Tolerance Patient tolerated treatment well    Behavior During Therapy WFL for tasks assessed/performed              Past Medical History:  Diagnosis Date   Anxiety    Arthritis    CHF (congestive heart failure) (HCC)    Chronic pain of multiple joints    Depression    Essential hypertension    GERD (gastroesophageal reflux disease)    Hyperlipidemia    Hyperlipidemia associated with type 2 diabetes mellitus (HCC)    Hypertension associated with type 2 diabetes mellitus (HCC)    Hypothyroidism    Mild scoliosis 10/19/2021   Osteoporosis    Sleep apnea    CPAP   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, left 09/05/2023   left wrist   needs excision   Squamous cell carcinoma of skin 09/05/2023   left ear   needs mohs   Type 2 diabetes mellitus with hypercholesterolemia (HCC)    Vitamin D  deficiency    Past Surgical History:  Procedure Laterality Date   ABDOMINAL HERNIA REPAIR     ABDOMINAL HYSTERECTOMY  1977   BREAST EXCISIONAL BIOPSY Right    EYE SURGERY  2020   bilateral cataract removal    KNEE ARTHROSCOPY Left 09/30/2014   Procedure: LEFT KNEE ARTHROSCOPY MEDIAL MENISECTOMY ABRASION CHONDROPLASTY SYNOVECTOMY SUPRAPATELLER CUFF;  Surgeon: Florencia Hunter, MD;  Location: [facility];  Service: Orthopedics;  Laterality: Left;   Patient Active Problem List   Diagnosis Date Noted   Hospital discharge follow-up 03/13/2024   Pulmonary hypertension, primary (HCC) 03/13/2024   Chronic anticoagulation 09/07/2023   Paroxysmal atrial fibrillation  (HCC) 06/19/2023   Hypercoagulable state due to paroxysmal atrial fibrillation (HCC) 06/19/2023   Chronic respiratory failure with hypoxia (HCC) 07/10/2022   Shortness of breath 07/04/2022   Chronic midline low back pain without sciatica 01/30/2019   Age-related osteoporosis with current pathol fracture of vertebra (HCC) 10/28/2018   Calcification of aorta (HCC) 10/02/2018   Compression fracture of T12 vertebra (HCC) 10/01/2018   Primary osteoarthritis of right knee 07/20/2017   Varicose veins of left lower extremity with complications 07/31/2016   OSA (obstructive sleep apnea) 09/14/2015   Chronic diastolic heart failure (HCC) 09/10/2015   Dysphagia, pharyngoesophageal phase 08/04/2015   Anorexia 08/04/2015   Malaise and fatigue 08/04/2015   Depression, recurrent (HCC) 04/08/2015   Vitamin D  deficiency    Anxiety    GERD (gastroesophageal reflux disease)    Arthritis    Hypertension associated with type 2 diabetes mellitus (HCC) 05/15/2013   Hyperlipidemia associated with type 2 diabetes mellitus (HCC) 05/15/2013   Type 2 diabetes mellitus with hypercholesterolemia (HCC) 05/15/2013   Morbid obesity (HCC) 05/15/2013   Hypothyroidism 05/15/2013    PCP: Dettinger, Lucio Sabin, MD  REFERRING PROVIDER: Hazle Lites, MD   REFERRING DIAG: Right knee pain, Left knee pain   THERAPY DIAG:  Unsteadiness on feet  Chronic pain of right knee  Chronic pain  of left knee  Rationale for Evaluation and Treatment: Rehabilitation  ONSET DATE: "years ago"   SUBJECTIVE:   SUBJECTIVE STATEMENT: Pt denies any pain today.  Pt states she feels "wore out" today.   PERTINENT HISTORY: Hypertension, diabetes type 2, chronic diastolic heart failure, atrial fibrillation, arthritis, osteoporosis, anxiety, depression, and requires portable oxygen  PAIN:  Are you having pain? No  PRECAUTIONS: None  RED FLAGS: None   WEIGHT BEARING RESTRICTIONS: No  FALLS:  Has patient fallen in last 6  months? No  LIVING ENVIRONMENT: Lives with: lives with their family Lives in: House/apartment Stairs: Yes: Internal: 20 steps; on right going up; step to pattern Has following equipment at home: Single point cane (patient only uses the cane when she is away from home)   OCCUPATION: retired  PLOF: Independent  PATIENT GOALS: walk better  NEXT MD VISIT: none scheduled  OBJECTIVE:  Note: Objective measures were completed at Evaluation unless otherwise noted.  COGNITION: Overall cognitive status: Within functional limits for tasks assessed     SENSATION: Patient reports no numbness or tingling  EDEMA:  No significant edema observed  POSTURE: rounded shoulders, forward head, decreased lumbar lordosis, and increased thoracic kyphosis  PALPATION: No tenderness to palpation reported  LOWER EXTREMITY ROM: assessed in sitting  Active ROM Right eval Left eval  Hip flexion    Hip extension    Hip abduction    Hip adduction    Hip internal rotation    Hip external rotation    Knee flexion 117 107  Knee extension 6 11  Ankle dorsiflexion    Ankle plantarflexion    Ankle inversion    Ankle eversion     (Blank rows = not tested)  LOWER EXTREMITY MMT:  MMT Right eval Left eval  Hip flexion 4-/5 4+/5  Hip extension    Hip abduction    Hip adduction    Hip internal rotation    Hip external rotation    Knee flexion 4+/5 4+/5  Knee extension 4+/5 5/5  Ankle dorsiflexion 4/5 4/5  Ankle plantarflexion    Ankle inversion    Ankle eversion     (Blank rows = not tested)  FUNCTIONAL TESTS:  5 times sit to stand: 28.17 seconds with single armrest and SPC  Timed up and go (TUG): 28.32 seconds with SPC and oxygen   GAIT: Assistive device utilized: Single point cane Level of assistance: Modified independence Comments: Decreased gait speed and stride length with poor foot clearance, heel strike, and toe off bilaterally                                                                                                                                 TREATMENT DATE:                                  05/07/24    EXERCISE  LOG  Exercise Repetitions and Resistance Comments  Nustep Lvl 3 x 15 mins   Cybex Knee Flexion 40# x 3 mins   Cybex Knee Extension 20# x 3 mins   Rockerboard  5 mins   LAQs    Seated Marches 4# x 30 reps bil   Seated Hip Adduction 4 min   Seated Hip Abduction Green x 3  min   Seated Ham Curls    5 STS    TUG    Rows    Extensions     Blank cell = exercise not performed today                                    01/23/24 EXERCISE LOG  Exercise Repetitions and Resistance Comments  Nustep  L4 x 17 minutes   LAQ 3# x 3 minutes    Marching on foam  3 minutes  BUE support  Step up  6" step x 10 reps each  With BUE support  Seated marching 3# x 2.5 minutes    Blank cell = exercise not performed today                  PATIENT EDUCATION:  Education details: progress with physical therapy and benefits of exercise Person educated: Patient Education method: Explanation Education comprehension: verbalized understanding  HOME EXERCISE PROGRAM:   ASSESSMENT:  CLINICAL IMPRESSION: Pt arrives for today's treatment session denying any pain, but reports feeling "wore out."  Pt ambulated to her mailbox earlier today and had issues getting back to her house.  Pt able to tolerate increased weight with cybex exercises today with increased rest breaks required due to fatigue.  Pt able to tolerate increased reps/time with all other exercises performed today.  Pt's SpO2 WNL throughout treatment session.  Pt denied any pain at completion of today's treatment session.   OBJECTIVE IMPAIRMENTS: Abnormal gait, decreased activity tolerance, decreased balance, decreased mobility, difficulty walking, decreased ROM, decreased strength, and pain.   ACTIVITY LIMITATIONS: carrying, lifting, squatting, stairs, transfers, and locomotion level  PARTICIPATION  LIMITATIONS: meal prep, cleaning, laundry, shopping, and community activity  PERSONAL FACTORS: Age, Past/current experiences, Time since onset of injury/illness/exacerbation, Transportation, and 3+ comorbidities: Hypertension, diabetes type 2, chronic diastolic heart failure, atrial fibrillation, arthritis, osteoporosis, anxiety, depression, and requires portable oxygen  are also affecting patient's functional outcome.   REHAB POTENTIAL: Fair    CLINICAL DECISION MAKING: Evolving/moderate complexity  EVALUATION COMPLEXITY: Moderate   GOALS: Goals reviewed with patient? Yes  SHORT TERM GOALS: Target date: 02/06/24 Patient will be independent with her initial HEP. Baseline: Goal status: MET  2.  Patient will improve her 5 times sit to stand time to 21 seconds or less for improved lower extremity power. Baseline: 2/10: 24.01 seconds; 3/5: 28.81 seconds; 4/9: 19.05 seconds Goal status: MET  3.  Patient will improve her timed up and go time to 21 seconds or less for improved functional mobility. Baseline: 2/10: 27.13;  3/5: 27.38 seconds; 4/9: 20.7 seconds Goal status: MET  LONG TERM GOALS: Target date: 02/27/24  Patient will be independent with her advanced HEP. Baseline:  Goal status: IN PROGRESS  2.  Patient will be able to transfer with minimal to no upper extremity support for improved independence. Baseline:  Goal status: MET  3.  Patient will improve her timed up and go time to 15 seconds or less for improved safety and reduced fall risk.  Baseline:  Goal status: IN PROGRESS  4.  Patient will improve her 5 times sit to stand time to 15 seconds or less to reduce her fall risk. Baseline:  Goal status: IN PROGRESS  PLAN:  PT FREQUENCY: 2x/week  PT DURATION: 6 weeks  PLANNED INTERVENTIONS: 97164- PT Re-evaluation, 97110-Therapeutic exercises, 97530- Therapeutic activity, 97112- Neuromuscular re-education, 97535- Self Care, [id number]- Manual therapy, [phone]- Gait training,  Patient/Family education, Balance training, Stair training, Joint mobilization, Cryotherapy, and Moist heat  PLAN FOR NEXT SESSION: Nustep, lower extremity strengthening, gait training, and manual therapy as needed   Deryl Flora, PTA 05/07/2024, 4:48 PM

## 2024-05-09 ENCOUNTER — Encounter

## 2024-05-10 DIAGNOSIS — R0609 Other forms of dyspnea: Secondary | ICD-10-CM | POA: Diagnosis not present

## 2024-05-10 DIAGNOSIS — J9611 Chronic respiratory failure with hypoxia: Secondary | ICD-10-CM | POA: Diagnosis not present

## 2024-05-14 DIAGNOSIS — I509 Heart failure, unspecified: Secondary | ICD-10-CM | POA: Diagnosis not present

## 2024-05-14 DIAGNOSIS — R0609 Other forms of dyspnea: Secondary | ICD-10-CM | POA: Diagnosis not present

## 2024-05-14 DIAGNOSIS — G4733 Obstructive sleep apnea (adult) (pediatric): Secondary | ICD-10-CM | POA: Diagnosis not present

## 2024-06-04 ENCOUNTER — Encounter: Payer: Self-pay | Admitting: Dermatology

## 2024-06-04 ENCOUNTER — Ambulatory Visit: Admitting: Dermatology

## 2024-06-04 VITALS — BP 154/30 | HR 75

## 2024-06-04 DIAGNOSIS — L905 Scar conditions and fibrosis of skin: Secondary | ICD-10-CM

## 2024-06-04 DIAGNOSIS — C4492 Squamous cell carcinoma of skin, unspecified: Secondary | ICD-10-CM

## 2024-06-04 DIAGNOSIS — L570 Actinic keratosis: Secondary | ICD-10-CM

## 2024-06-04 DIAGNOSIS — Z86007 Personal history of in-situ neoplasm of skin: Secondary | ICD-10-CM | POA: Diagnosis not present

## 2024-06-04 DIAGNOSIS — W908XXA Exposure to other nonionizing radiation, initial encounter: Secondary | ICD-10-CM | POA: Diagnosis not present

## 2024-06-04 DIAGNOSIS — B079 Viral wart, unspecified: Secondary | ICD-10-CM | POA: Diagnosis not present

## 2024-06-04 NOTE — Patient Instructions (Addendum)

## 2024-06-04 NOTE — Progress Notes (Signed)
 Follow-Up Visit   Subjective  Danielle Parrish is a 83 y.o. female who presents for the following: Here to follow up after efudex  treatment for Aks  The patient has spots, moles and lesions to be evaluated, some may be new or changing and the patient may have concern these could be cancer.   The following portions of the chart were reviewed this encounter and updated as appropriate: medications, allergies, medical history  Review of Systems:  No other skin or systemic complaints except as noted in HPI or Assessment and Plan.  Objective  Well appearing patient in no apparent distress; mood and affect are within normal limits.  A focused examination was performed of the following areas: Face  Relevant exam findings are noted in the Assessment and Plan.  Left Buccal Cheek, Right Upper Arm - Anterior Erythematous thin papules/macules with gritty scale.   Assessment & Plan   ACTINIC KERATOSIS Exam: Erythematous thin papules/macules with gritty scale at the face  Actinic keratoses are precancerous spots that appear secondary to cumulative UV radiation exposure/sun exposure over time. They are chronic with expected duration over 1 year. A portion of actinic keratoses will progress to squamous cell carcinoma of the skin. It is not possible to reliably predict which spots will progress to skin cancer and so treatment is recommended to prevent development of skin cancer.  Recommend daily broad spectrum sunscreen SPF 30+ to sun-exposed areas, reapply every 2 hours as needed.  Recommend staying in the shade or wearing long sleeves, sun glasses (UVA+UVB protection) and wide brim hats (4-inch brim around the entire circumference of the hat). Call for new or changing lesions.  Treatment Plan: Patient used Efudex  cream to bilateral cheeks 2x per day for 2 weeks. Patient responded well to treatment.  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, treated on 10/01/2024, healed by secondary  intention - Reassured that wound is healing well - No evidence of infection - No swelling, induration, purulence, dehiscence, or tenderness out of proportion to the clinical exam, see photo above - Discussed that scars take up to 12 months to mature from the date of surgery - Recommend SPF 30+ to scar daily to prevent purple color from UV exposure during scar maturation process  Scar s/p WLE for CIS on the left wrist, treated on 10/15/2024, healed by linear closure - Reassured that wound is healing well - No evidence of infection - No swelling, induration, purulence, dehiscence, or tenderness out of proportion to the clinical exam, see photo above - Discussed that scars take up to 12 months to mature from the date of surgery - Recommend SPF 30+ to scar daily to prevent purple color from UV exposure during scar maturation process  HISTORY OF SQUAMOUS CELL CARCINOMA OF THE SKIN - No evidence of recurrence today - No lymphadenopathy - Recommend regular full body skin exams - Recommend daily broad spectrum sunscreen SPF 30+ to sun-exposed areas, reapply every 2 hours as needed.  - Call if any new or changing lesions are noted between office visits  VIRAL WARTS, UNSPECIFIED TYPE (2) Left Frontal Scalp, Left Upper Arm - Anterior Destruction of lesion - Left Frontal Scalp, Left Upper Arm - Anterior Complexity: simple   Destruction method: cryotherapy   Informed consent: discussed and consent obtained   Timeout:  patient name, date of birth, surgical site, and procedure verified Lesion destroyed using liquid nitrogen: Yes   Region frozen until ice ball extended beyond lesion: Yes   Cryotherapy  cycles:  2 Outcome: patient tolerated procedure well with no complications   Post-procedure details: wound care instructions given   AK (ACTINIC KERATOSIS) (2) Left Buccal Cheek, Right Upper Arm - Anterior Destruction of lesion - Left Buccal Cheek, Right Upper Arm - Anterior Complexity: simple    Destruction method: cryotherapy   Informed consent: discussed and consent obtained   Timeout:  patient name, date of birth, surgical site, and procedure verified Lesion destroyed using liquid nitrogen: Yes   Region frozen until ice ball extended beyond lesion: Yes   Cryotherapy cycles:  2 Outcome: patient tolerated procedure well with no complications   Post-procedure details: wound care instructions given   SCAR   SQUAMOUS CELL CARCINOMA OF SKIN    Return in about 6 months (around 12/04/2024) for TBSC.  I, Haig Levan, Surg Tech III, am acting as scribe for Deneise Finlay, MD.   Documentation: I have reviewed the above documentation for accuracy and completeness, and I agree with the above.  Deneise Finlay, MD

## 2024-06-08 NOTE — Progress Notes (Unsigned)
 Danielle Parrish [place], female    DOB: [birth year]   MRN: [id number]   Brief patient profile:  42 yowf never smoker  referred to pulmonary clinic 07/04/2022 by Dr Omar Bibber  for cough p shoulder injury requiring GA and surgery in Aug of 2022.  [employer] shopping still able to do groceries leaning on cart / mb and back flat x 75 ft with walker   History of Present Illness  07/04/2022  Pulmonary/ 1st office eval/[name] on Macrodantin  Chief Complaint  Patient presents with   Consult    Pt states she has some SOB and a runny nose x1 year. Pt states she has Oxygen  and CPAP machine but she does not use the CPAP machine.   Dyspnea:  mb and back slowly = MMRC3 = can't walk 100 yards even at a slow pace at a flat grade s stopping due to sob   Cough: voice fatigue  Sleep: cpap/02  SABA use: minimal better    Rec You will need to get the echocardiogram asap - call this office if can't schedule it within  a week  Stop macrodantin   Please schedule a follow up office visit in 3- 4 weeks, sooner if needed  - [place] [facility]  - bring all medications Late add: pt instructed on use of amb 02 = goal is to keep > 90% saturations at all times     08/22/2022  f/u ov/[place] office/Keymari Sato re: doe maint on ?  (Did not bring meds)   Chief Complaint  Patient presents with   Follow-up    Feels breathing is not doing well. Knees are bothering patient today.   Dyspnea:  walking mailbox and back but not checking sats despite feeling breathing worse  Cough: none but does have mild globus sensation on ACEi Sleeping: cpap/02 sleeping 0k SABA use: not really helping sob  02: not using x during the noct  Rec Stop lisinopril   and start olmesartan  20 mg on daily in its place to see if helps breathing Amlodipine  may be making your swell up and need to reduce the dose if blood pressure too low  Make sure you check your oxygen  saturation  AT  your highest level of activity (not after you stop)   to be sure it stays over  90%        03/09/2023  f/u ov/[place] office/[name] re: doe/ AIAR  maint on prn saba    Chief Complaint  Patient presents with   Follow-up    Pt f/u she states that she is having difficulty walking, SOB and low oxygen  sats  Dyspnea:  more limited by R hip than breathing/ mb and back on RA does not check 02 while walking there also doing steps 10 x daily stops at top but not using 02 then either or monitoring sats Cough: none  Sleeping: lost remote, bed flat / 2 pillows = baseline  SABA use: albuterol  once a day 02: 2lpm and cpap fine  Rec Make sure you check your oxygen  saturation  AT  your highest level of activity (not after you stop)   to be sure it stays over 90%   We need to work on keeping your blood pressure lower due to your valvular heart problem     03/06/2024 ACUTE ov/[place] office/[name] re: doe/valvular heart dz maint on 02  Cc: just tired  Dyspnea:  room to room doe /  twice weekly  PT with desats reported on POC  Cough: not a problem  Sleeping: bed is flat/ lots of pillows s resp cc  on cpap per neuro > tired on awakening  SABA use: none 02: 3lpm in cpap/ 5lp POC during the day  Rec    06/09/2024  f/u ov/[place] office/[name] re: *** maint on ***  No chief complaint on file.   Dyspnea:  *** Cough: *** Sleeping: ***   resp cc  SABA use: *** 02: ***  Lung cancer screening: ***   No obvious day to day or daytime variability or assoc excess/ purulent sputum or mucus plugs or hemoptysis or cp or chest tightness, subjective wheeze or overt sinus or hb symptoms.    Also denies any obvious fluctuation of symptoms with weather or environmental changes or other aggravating or alleviating factors except as outlined above   No unusual exposure hx or h/o childhood pna/ asthma or knowledge of premature birth.  Current Allergies, Complete Past Medical History, Past Surgical History, Family History, and Social History were reviewed in [employer] record.  ROS  The following are not active complaints unless bolded Hoarseness, sore throat, dysphagia, dental problems, itching, sneezing,  nasal congestion or discharge of excess mucus or purulent secretions, ear ache,   fever, chills, sweats, unintended wt loss or wt gain, classically pleuritic or exertional cp,  orthopnea pnd or arm/hand swelling  or leg swelling, presyncope, palpitations, abdominal pain, anorexia, nausea, vomiting, diarrhea  or change in bowel habits or change in bladder habits, change in stools or change in urine, dysuria, hematuria,  rash, arthralgias, visual complaints, headache, numbness, weakness or ataxia or problems with walking or coordination,  change in mood or  memory.        No outpatient medications have been marked as taking for the 06/09/24 encounter (Appointment) with [name], MD.                Past Medical History:  Diagnosis Date   Anxiety    Arthritis    CHF (congestive heart failure) (HCC)    Chronic pain of multiple joints    Depression    Essential hypertension    GERD (gastroesophageal reflux disease)    Hyperlipidemia    Hyperlipidemia associated with type 2 diabetes mellitus (HCC)    Hypertension associated with type 2 diabetes mellitus (HCC)    Hypothyroidism    Mild scoliosis 10/19/2021   Osteoporosis    Sleep apnea    CPAP   Type 2 diabetes mellitus with hypercholesterolemia (HCC)    Vitamin D  deficiency        Objective:   Wts  06/09/2024       ***  03/06/2024       188  06/11/2023       181  03/09/2023       180   02/06/2023       181   11/14/2022     179   08/22/22 173 lb 12.8 oz (78.8 kg)  08/02/22 172 lb (78 kg)  07/04/22 173 lb 3.2 oz (78.6 kg)    Vital signs reviewed  06/09/2024  - Note at rest 02 sats  ***% on ***   General appearance:    ***    distant BS bilaterally   2/ 6 sem  trace ankle edema bilaterally ***        I personally reviewed images and agree with radiology impression as  follows:   Chest CTa    02/28/24  SSA bases  No pulmonary embolus or other  acute finding       Labs from 02/28/24 reviewed:  Nl tsh, bnp   HC03 30 Eos 0.06  with Hgb 12.5  Assessment

## 2024-06-09 ENCOUNTER — Encounter: Payer: Self-pay | Admitting: Internal Medicine

## 2024-06-09 ENCOUNTER — Ambulatory Visit (INDEPENDENT_AMBULATORY_CARE_PROVIDER_SITE_OTHER): Admitting: Internal Medicine

## 2024-06-09 VITALS — BP 147/69 | HR 73 | Ht 62.0 in | Wt 186.2 lb

## 2024-06-09 DIAGNOSIS — R0609 Other forms of dyspnea: Secondary | ICD-10-CM | POA: Diagnosis not present

## 2024-06-09 DIAGNOSIS — I509 Heart failure, unspecified: Secondary | ICD-10-CM | POA: Diagnosis not present

## 2024-06-09 DIAGNOSIS — J9611 Chronic respiratory failure with hypoxia: Secondary | ICD-10-CM

## 2024-06-09 DIAGNOSIS — R0602 Shortness of breath: Secondary | ICD-10-CM

## 2024-06-09 DIAGNOSIS — G4733 Obstructive sleep apnea (adult) (pediatric): Secondary | ICD-10-CM | POA: Diagnosis not present

## 2024-06-09 NOTE — Patient Instructions (Addendum)
Make sure you check your oxygen saturation  AT  your highest level of activity (not after you stop)   to be sure it stays over 90% and adjust  02 flow upward to maintain this level if needed but remember to turn it back to previous settings when you stop (to conserve your supply).   Please schedule a follow up visit in 3 months but call sooner if needed  

## 2024-06-10 DIAGNOSIS — J9611 Chronic respiratory failure with hypoxia: Secondary | ICD-10-CM | POA: Diagnosis not present

## 2024-06-10 DIAGNOSIS — R0609 Other forms of dyspnea: Secondary | ICD-10-CM | POA: Diagnosis not present

## 2024-06-10 NOTE — Assessment & Plan Note (Signed)
 Onset Aug 2022  p GA for L shoulder surgery while on chronic macrodantin  - macrodantin  d/c 07/04/2022  - Echo 07/11/2022  1 diastolic dysfunction  Mild AS / AI with nl LA - 08/22/2022 try off acei   - S/p macrodantin  rx 01/01/23 > worse sats 02/06/2023  so d/c again and rec list as allergy  - 06/11/2023 exac x ? 2 weeks esp x 24 h  > RAF new > to ER  - Chest CTa    02/28/24  SSA bases No PE  or other acute finding    - Echo 03/11/24 only mild AS nl LA   I suspect we would see some mild ILD on HRCT s/p multiple macrodantin  exp and ? ALI p shoudler surgery but would not change rx = 02 (see chronic resp failure) and attempt at wt loss

## 2024-06-10 NOTE — Assessment & Plan Note (Addendum)
 Desat  on ambulation 07/04/2022  > d/c'd macrodantin   - 07/04/2022   Walked on RA  x  2  lap(s) =  approx 500  ft  @ slow pace, stopped due to desats/sob with lowest 02 sats 80% > rec use portable 02 to maintain > 90% (already had it at time of ov but didn't bring it with her)    - 08/22/2022   Walked on RA  x  2  lap(s) =  approx 300  ft  @ slow/cane pace, stopped due to knee pain> sob  with lowest 02 sats 88%   - 11/14/2022 no longer desats walking as of  11/14/2022  - 02/06/2023 room air 150 ft walking > SOB with sats down to 84% - placed patient on 2LO2 cont and sats increased to 92% walking  - sats on RA 03/09/2023 on arrival > 03/09/2023   Walked on 2lpm  x  1  lap(s) =  approx 150  ft  @ slow pace, stopped due to sob  with lowest 02 sats 92%   - CTa  02/28/24 SSA, no emboli or ILD  - 03/06/2024   Walked on 4lpm continuous   150 ft very slow pace lowest sats 94%   Has new POC but not charged up yet, having trouble with her previous tanks also > rec contact DME immediately in future if this happens as adequate 02 delivery is critical here   Again advised: Make sure you check your oxygen  saturation  AT  your highest level of activity (not after you stop)   to be sure it stays over 90% and adjust  02 flow upward to maintain this level if needed but remember to turn it back to previous settings when you stop (to conserve your supply).   F/u q 3 m, sooner prn          Each maintenance medication was reviewed in detail including emphasizing most importantly the difference between maintenance and prns and under what circumstances the prns are to be triggered using an action plan format where appropriate.  Total time for H and P, chart review, counseling, reviewing 02/ pulse ox  device(s) and generating customized AVS unique to this office visit / same day charting = 25 min

## 2024-06-14 ENCOUNTER — Other Ambulatory Visit: Payer: Self-pay | Admitting: Dermatology

## 2024-06-14 DIAGNOSIS — R0609 Other forms of dyspnea: Secondary | ICD-10-CM | POA: Diagnosis not present

## 2024-06-14 DIAGNOSIS — G4733 Obstructive sleep apnea (adult) (pediatric): Secondary | ICD-10-CM | POA: Diagnosis not present

## 2024-06-14 DIAGNOSIS — I509 Heart failure, unspecified: Secondary | ICD-10-CM | POA: Diagnosis not present

## 2024-06-14 DIAGNOSIS — L570 Actinic keratosis: Secondary | ICD-10-CM

## 2024-07-07 DIAGNOSIS — M25561 Pain in right knee: Secondary | ICD-10-CM | POA: Diagnosis not present

## 2024-07-07 DIAGNOSIS — M25562 Pain in left knee: Secondary | ICD-10-CM | POA: Diagnosis not present

## 2024-07-10 DIAGNOSIS — R0609 Other forms of dyspnea: Secondary | ICD-10-CM | POA: Diagnosis not present

## 2024-07-10 DIAGNOSIS — J9611 Chronic respiratory failure with hypoxia: Secondary | ICD-10-CM | POA: Diagnosis not present

## 2024-07-14 DIAGNOSIS — R0609 Other forms of dyspnea: Secondary | ICD-10-CM | POA: Diagnosis not present

## 2024-07-14 DIAGNOSIS — G4733 Obstructive sleep apnea (adult) (pediatric): Secondary | ICD-10-CM | POA: Diagnosis not present

## 2024-07-14 DIAGNOSIS — I509 Heart failure, unspecified: Secondary | ICD-10-CM | POA: Diagnosis not present

## 2024-07-23 ENCOUNTER — Ambulatory Visit: Admitting: Family Medicine

## 2024-07-23 ENCOUNTER — Encounter: Payer: Self-pay | Admitting: Family Medicine

## 2024-07-23 VITALS — BP 128/72 | HR 64 | Temp 97.5°F | Wt 182.8 lb

## 2024-07-23 DIAGNOSIS — E1159 Type 2 diabetes mellitus with other circulatory complications: Secondary | ICD-10-CM | POA: Diagnosis not present

## 2024-07-23 DIAGNOSIS — M170 Bilateral primary osteoarthritis of knee: Secondary | ICD-10-CM

## 2024-07-23 DIAGNOSIS — E785 Hyperlipidemia, unspecified: Secondary | ICD-10-CM | POA: Diagnosis not present

## 2024-07-23 DIAGNOSIS — E1169 Type 2 diabetes mellitus with other specified complication: Secondary | ICD-10-CM

## 2024-07-23 DIAGNOSIS — E039 Hypothyroidism, unspecified: Secondary | ICD-10-CM | POA: Diagnosis not present

## 2024-07-23 DIAGNOSIS — E7800 Pure hypercholesterolemia, unspecified: Secondary | ICD-10-CM | POA: Diagnosis not present

## 2024-07-23 DIAGNOSIS — I152 Hypertension secondary to endocrine disorders: Secondary | ICD-10-CM | POA: Diagnosis not present

## 2024-07-23 LAB — LIPID PANEL

## 2024-07-23 LAB — BAYER DCA HB A1C WAIVED: HB A1C (BAYER DCA - WAIVED): 5.5 % (ref 4.8–5.6)

## 2024-07-23 MED ORDER — LINACLOTIDE 145 MCG PO CAPS: 145.0000 ug | ORAL_CAPSULE | ORAL | 3 refills | Status: DC | PRN

## 2024-07-23 NOTE — Progress Notes (Signed)
 BP 128/72   Pulse 64   Temp (!) 97.5 F (36.4 C) (Temporal)   Wt 182 lb 12.8 oz (82.9 kg)   SpO2 94%   BMI 33.43 kg/m    Subjective:   Patient ID: Danielle Parrish, female    DOB: [birth year], 83 y.o.   MRN: [id number]  HPI: Danielle Parrish is a 83 y.o. female presenting on 07/23/2024 for Medical Management of Chronic Issues   HPI Type 2 diabetes mellitus Patient comes in today for recheck of his diabetes. Patient has been currently taking no medicine currently, diet controlled. Patient is currently on an ACE inhibitor/ARB. Patient has seen an ophthalmologist this year. Patient denies any new issues with their feet. The symptom started onset as an adult hypertension and hyperlipidemia and hypothyroidism ARE RELATED TO DM   Hypertension Patient is currently on olmesartan  and metoprolol  and furosemide  and amlodipine , and their blood pressure today is 128/70. Patient denies any lightheadedness or dizziness. Patient denies headaches, blurred vision, chest pains, shortness of breath, or weakness. Denies any side effects from medication and is content with current medication.   Hyperlipidemia Patient is coming in for recheck of his hyperlipidemia. The patient is currently taking lovastatin . They deny any issues with myalgias or history of liver damage from it. They deny any focal numbness or weakness or chest pain.   Hypothyroidism recheck Patient is coming in for thyroid  recheck today as well. They deny any issues with hair changes or heat or cold problems or diarrhea or constipation. They deny any chest pain or palpitations. They are currently on levothyroxine  100 micrograms   Patient just recently had some injections in her knees but she still having some weakness and arthritis and aches especially in her knees and lower legs.  She wants referral back to therapy  Relevant past medical, surgical, family and social history reviewed and updated as indicated. Interim medical history  since our last visit reviewed. Allergies and medications reviewed and updated.  Review of Systems  Constitutional:  Negative for chills and fever.  Eyes:  Negative for redness and visual disturbance.  Respiratory:  Negative for chest tightness and shortness of breath.   Cardiovascular:  Negative for chest pain and leg swelling.  Musculoskeletal:  Positive for arthralgias, gait problem and myalgias. Negative for back pain.  Skin:  Negative for rash.  Neurological:  Positive for weakness. Negative for dizziness, light-headedness and headaches.  Psychiatric/Behavioral:  Negative for agitation and behavioral problems.   All other systems reviewed and are negative.   Per HPI unless specifically indicated above   Allergies as of 07/23/2024       Reactions   Nitrofurantoin  Shortness Of Breath   Likely macrodantin  pulmonary toxicity 06/2022    Nsaids    Burns stomach.    Penicillins Hives, Other (See Comments)   Has patient had a PCN reaction causing immediate rash, facial/tongue/throat swelling, SOB or lightheadedness with hypotension: Yes Has patient had a PCN reaction causing severe rash involving mucus membranes or skin necrosis: unknown Has patient had a PCN reaction that required hospitalization No Has patient had a PCN reaction occurring within the last 10 years: No If all of the above answers are NO, then may proceed with Cephalosporin use.   Rosuvastatin Other (See Comments)   Body aches.   Statins Other (See Comments)   Vytorin [ezetimibe-simvastatin]    Body aches.    Aspirin Nausea Only   Tolmetin Nausea Only   Burns stomach.  Medication List        Accurate as of July 23, 2024  1:09 PM. If you have any questions, ask your nurse or doctor.          acetaminophen  500 MG tablet Commonly known as: TYLENOL  Take 1 tablet (500 mg total) by mouth every 6 (six) hours as needed.   albuterol  108 (90 Base) MCG/ACT inhaler Commonly known as: VENTOLIN  HFA Take  2 puffs eveery 6 hours as needed for wheezing or shortness of breath   amLODipine  10 MG tablet Commonly known as: NORVASC  Take 1 tablet (10 mg total) by mouth daily.   apixaban  5 MG Tabs tablet Commonly known as: Eliquis  Take 1 tablet (5 mg total) by mouth 2 (two) times daily.   azelastine  0.1 % nasal spray Commonly known as: ASTELIN  Place 1 spray into both nostrils 2 (two) times daily. Use in each nostril as directed   diclofenac  Sodium 1 % Gel Commonly known as: VOLTAREN  APPLY 4 GRAMS TOPICALLY 4 (FOUR) TIMES DAILY.   diphenhydrAMINE 25 MG tablet Commonly known as: BENADRYL Take 50 mg by mouth at bedtime as needed for allergies.   fluorouracil  5 % cream Commonly known as: EFUDEX  APPLY TOPICALLY 2 TIMES DAILY FOR 14 DAYS. 2 TIMES DAILY TO CHEEKS AND JAW LINE FOR 2 WEEKS.   furosemide  40 MG tablet Commonly known as: LASIX  TAKE 1 TABLET BY MOUTH EVERY DAY   gabapentin  300 MG capsule Commonly known as: NEURONTIN  Take 1 capsule (300 mg total) by mouth 3 (three) times daily.   Iron 325 (65 Fe) MG Tabs Take 1 tablet by mouth 3 (three) times daily.   ketoconazole  2 % cream Commonly known as: NIZORAL  APPLY TO AFFECTED AREA EVERYDAY   levothyroxine  100 MCG tablet Commonly known as: SYNTHROID  Take 1 tablet (100 mcg total) by mouth daily before breakfast.   lidocaine  5 % Commonly known as: LIDODERM  PLACE 1 PATCH ONTO THE SKIN DAILY. REMOVE & DISCARD PATCH WITHIN 12 HOURS OR AS DIRECTED BY MD   linaclotide  145 MCG Caps capsule Commonly known as: LINZESS  Take 1 capsule (145 mcg total) by mouth as needed.   lovastatin  40 MG tablet Commonly known as: MEVACOR  Take 1 tablet (40 mg total) by mouth at bedtime.   magnesium  oxide 400 (240 Mg) MG tablet Commonly known as: MAG-OX Take 1 tablet (400 mg total) by mouth daily.   metoprolol  succinate 25 MG 24 hr tablet Commonly known as: TOPROL -XL Take 1 tablet (25 mg total) by mouth daily.   mirabegron  ER 25 MG Tb24  tablet Commonly known as: Myrbetriq  Take 1 tablet (25 mg total) by mouth daily.   nystatin  powder Commonly known as: MYCOSTATIN /NYSTOP  APPLY TO AFFECTED AREA 4 TIMES A DAY   olmesartan  20 MG tablet Commonly known as: BENICAR  Take 1 tablet (20 mg total) by mouth daily.   omeprazole  20 MG capsule Commonly known as: PRILOSEC Take 1 capsule (20 mg total) by mouth 2 (two) times daily before a meal. What changed: when to take this   PRESERVISION AREDS 2 PO Take by mouth.   pyridOXINE 100 MG tablet Commonly known as: VITAMIN B6 Take 100 mg by mouth daily.   sertraline  100 MG tablet Commonly known as: ZOLOFT  Take 2 tablets (200 mg total) by mouth daily.   sodium chloride  0.65 % Soln nasal spray Commonly known as: OCEAN Place 1 spray into both nostrils as needed for congestion.   VITAMIN D -3 PO Take 1 capsule by mouth daily.  Objective:   BP 128/72   Pulse 64   Temp (!) 97.5 F (36.4 C) (Temporal)   Wt 182 lb 12.8 oz (82.9 kg)   SpO2 94%   BMI 33.43 kg/m   Wt Readings from Last 3 Encounters:  07/23/24 182 lb 12.8 oz (82.9 kg)  06/09/24 186 lb 3.2 oz (84.5 kg)  04/24/24 188 lb 12.8 oz (85.6 kg)    Physical Exam Vitals and nursing note reviewed.  Constitutional:      General: She is not in acute distress.    Appearance: She is well-developed. She is not diaphoretic.  Eyes:     Conjunctiva/sclera: Conjunctivae normal.  Cardiovascular:     Rate and Rhythm: Normal rate and regular rhythm.     Heart sounds: Murmur heard.  Pulmonary:     Effort: Pulmonary effort is normal. No respiratory distress.     Breath sounds: Normal breath sounds. No wheezing.  Musculoskeletal:        General: No swelling.  Skin:    General: Skin is warm and dry.     Findings: No rash.  Neurological:     Mental Status: She is alert and oriented to person, place, and time.     Coordination: Coordination normal.  Psychiatric:        Behavior: Behavior normal.        Assessment & Plan:   Problem List Items Addressed This Visit       Cardiovascular and Mediastinum   Hypertension associated with type 2 diabetes mellitus (HCC) (Chronic)   Relevant Orders   CBC with Differential/Platelet   CMP14+EGFR     Endocrine   Hyperlipidemia associated with type 2 diabetes mellitus (HCC) (Chronic)   Relevant Orders   Lipid panel   Type 2 diabetes mellitus with hypercholesterolemia (HCC) - Primary   Relevant Orders   Bayer DCA Hb A1c Waived   CMP14+EGFR   Hypothyroidism   Relevant Orders   TSH   Other Visit Diagnoses       Bilateral primary osteoarthritis of knee       Relevant Orders   Ambulatory referral to Physical Therapy     Place new referral back to physical therapy.  She says she still feeling weak and has some pain in her legs although she did do better with the knees after the injections but needs to get her strength back up.  A1c was good at 5.5.  Follow up plan: Return in about 3 months (around 10/23/2024), or if symptoms worsen or fail to improve, for Diabetes and hypertension recheck.  Counseling provided for all of the vaccine components Orders Placed This Encounter  Procedures   Bayer DCA Hb A1c Waived   CBC with Differential/Platelet   Lipid panel   CMP14+EGFR   TSH   Ambulatory referral to Physical Therapy    Fonda Levins, MD [facility] 07/23/2024, 1:09 PM

## 2024-07-24 LAB — CMP14+EGFR
ALT: 20 IU/L (ref 0–32)
AST: 18 IU/L (ref 0–40)
Albumin: 4.4 g/dL (ref 3.7–4.7)
Alkaline Phosphatase: 68 IU/L (ref 44–121)
BUN/Creatinine Ratio: 19 (ref 12–28)
BUN: 13 mg/dL (ref 8–27)
Bilirubin Total: 0.4 mg/dL (ref 0.0–1.2)
CO2: 25 mmol/L (ref 20–29)
Calcium: 10.5 mg/dL — AB (ref 8.7–10.3)
Chloride: 91 mmol/L — AB (ref 96–106)
Creatinine, Ser: 0.68 mg/dL (ref 0.57–1.00)
Globulin, Total: 1.9 g/dL (ref 1.5–4.5)
Glucose: 103 mg/dL — AB (ref 70–99)
Potassium: 4.6 mmol/L (ref 3.5–5.2)
Sodium: 131 mmol/L — AB (ref 134–144)
Total Protein: 6.3 g/dL (ref 6.0–8.5)
eGFR: 86 mL/min/1.73 (ref >59)

## 2024-07-24 LAB — CBC WITH DIFFERENTIAL/PLATELET
Basophils Absolute: 0 x10E3/uL (ref 0.0–0.2)
Basos: 0 %
EOS (ABSOLUTE): 0.1 x10E3/uL (ref 0.0–0.4)
Eos: 1 %
Hematocrit: 40.4 % (ref 34.0–46.6)
Hemoglobin: 13.5 g/dL (ref 11.1–15.9)
Immature Grans (Abs): 0 x10E3/uL (ref 0.0–0.1)
Immature Granulocytes: 0 %
Lymphocytes Absolute: 1.6 x10E3/uL (ref 0.7–3.1)
Lymphs: 23 %
MCH: 33.3 pg — ABNORMAL HIGH (ref 26.6–33.0)
MCHC: 33.4 g/dL (ref 31.5–35.7)
MCV: 100 fL — ABNORMAL HIGH (ref 79–97)
Monocytes Absolute: 0.7 x10E3/uL (ref 0.1–0.9)
Monocytes: 10 %
Neutrophils Absolute: 4.5 x10E3/uL (ref 1.4–7.0)
Neutrophils: 66 %
Platelets: 202 x10E3/uL (ref 150–450)
RBC: 4.06 x10E6/uL (ref 3.77–5.28)
RDW: 11.4 % — ABNORMAL LOW (ref 11.7–15.4)
WBC: 7 x10E3/uL (ref 3.4–10.8)

## 2024-07-24 LAB — LIPID PANEL
Cholesterol, Total: 152 mg/dL (ref 100–199)
HDL: 56 mg/dL (ref >39)
LDL CALC COMMENT:: 2.7 ratio (ref 0.0–4.4)
LDL Chol Calc (NIH): 70 mg/dL (ref 0–99)
Triglycerides: 152 mg/dL — AB (ref 0–149)
VLDL Cholesterol Cal: 26 mg/dL (ref 5–40)

## 2024-07-24 LAB — TSH: TSH: 0.316 u[IU]/mL — AB (ref 0.450–4.500)

## 2024-07-30 ENCOUNTER — Ambulatory Visit: Payer: Self-pay | Admitting: Family Medicine

## 2024-07-30 NOTE — Telephone Encounter (Signed)
 See other encounter.

## 2024-08-04 ENCOUNTER — Other Ambulatory Visit: Payer: Self-pay | Admitting: Internal Medicine

## 2024-08-04 ENCOUNTER — Other Ambulatory Visit: Payer: Self-pay | Admitting: Dermatology

## 2024-08-04 ENCOUNTER — Other Ambulatory Visit: Payer: Self-pay | Admitting: Family Medicine

## 2024-08-04 DIAGNOSIS — E1159 Type 2 diabetes mellitus with other circulatory complications: Secondary | ICD-10-CM

## 2024-08-04 DIAGNOSIS — I152 Hypertension secondary to endocrine disorders: Secondary | ICD-10-CM

## 2024-08-04 DIAGNOSIS — E7800 Pure hypercholesterolemia, unspecified: Secondary | ICD-10-CM

## 2024-08-04 DIAGNOSIS — E1169 Type 2 diabetes mellitus with other specified complication: Secondary | ICD-10-CM

## 2024-08-04 DIAGNOSIS — L570 Actinic keratosis: Secondary | ICD-10-CM

## 2024-08-06 NOTE — Progress Notes (Signed)
 Cardiology Office Note:  .   Date:  08/07/2024  ID:  Briselda Naval, DOB 01-21-1941, MRN [id number] PCP: Dettinger, Fonda LABOR, MD  [facility] Providers Cardiologist:  Diannah SHAUNNA Maywood, MD {  History of Present Illness: .   Danielle Parrish is a 83 y.o. female  with PMHx of PAF (new dx 05/2023, self converted to NSR), hypertension, hyperlipidemia, type 2 diabetes, chronic hypoxic respiratory failure on oxygen , OSA on CPAP  who reports to [facility] office for follow up.   Last seen in heartcare 04/24/2024 with Almarie Crate, NP for follow up.  Reported intermittent fatigue X 2 to 3 years.  Confirms compliance of CPAP.  Overall doing well from cardiac standpoint with no other cardiac complaints.  Ordered magnesium  oxide 400 mg daily due to mag levels and fatigue.  No other medication changes.  Continued on amlodipine  10 mg daily, Eliquis  5 mg twice daily, Lovastatin  40 mg daily, Metoprolol  XL 25 mg daily and Benicar  20 mg daily.   Today, ongoing worsening fatigue since last OV.  Denies CP, SOB, edema, dizziness, palpitations, edema, syncope. Reports compliance with medications. Report home HR throughout the day can get as low as 40 BPM. Endorses heart healthy diet. She still uses home O2 without any recent changes.  She stays at home alone. She is able to walk down stairs to wash clothes and can cook for herself. She goes to exercise class for 1 hour about 1x/week. She uses a cane for assistance. Denies tobacco use/alcohol/drug use. Denies any recent hospitalizations or visits to the emergency department.   ROS: 10 point review of system has been reviewed and considered negative except ones been listed in the HPI.   Studies Reviewed: SABRA   EKG Interpretation Date/Time:  Thursday August 07 2024 11:10:54 EDT Ventricular Rate:  67 PR Interval:  238 QRS Duration:  88 QT Interval:  392 QTC Calculation: 414 R Axis:   -28  Text Interpretation: Sinus rhythm with 1st degree A-V block   Inferior Q waves noted  Nonspecific ST and T wave abnormality  When compared with ECG of 07-Sep-2023 11:15, Questionable change in initial forces of Lateral leads  Confirmed by Sheron Hallmark ([id number]) on 08/07/2024 11:15:57 AM    Echo 02/2024:  1. Left ventricular ejection fraction, by estimation, is 55 to 60%. Left  ventricular ejection fraction by 3D volume is 56 %. The left ventricle has  normal function. Left ventricular endocardial border not optimally defined to evaluate regional wall  motion. Left ventricular diastolic parameters are consistent with Grade I  diastolic dysfunction (impaired relaxation). The average left ventricular  global longitudinal strain is -14.3 %.   2. Right ventricular systolic function is normal. The right ventricular  size is normal. Tricuspid regurgitation signal is inadequate for assessing  PA pressure.   3. The mitral valve is normal in structure. No evidence of mitral valve  regurgitation. No evidence of mitral stenosis.   4. The aortic valve has an indeterminant number of cusps. Aortic valve  regurgitation is not visualized. Mild aortic valve stenosis. Aortic valve  mean gradient measures 8.0 mmHg. Aortic valve Vmax measures 2.02 m/s.   5. The inferior vena cava is normal in size with greater than 50%  respiratory variability, suggesting right atrial pressure of 3 mmHg.   6. Increased flow velocities may be secondary to anemia, thyrotoxicosis,  hyperdynamic or high flow state.   Comparison(s): A prior study was performed on 07/11/2022. Changes from prior study are noted. AI  is not visualized.   Cardiac monitor 01/2024:   Patch wear time was for 13 days and 23 hours.   Normal sinus rhythm predominantly ranging from 38 to 203 bpm with an average HR 65 bpm.   44 runs of SVT, fastest interval lasting 11 beats/3.6 seconds and the longest interval lasting 24 beats/12 seconds.   3 runs of NSVT, fastest interval lasting 4 beats and the longest interval  lasting 6 beats.   No evidence of high-grade AV block or pauses.   <1% PAC burden and <1% PVC burden.   No patient triggered events noted.   Echo 06/2022:   1. Left ventricular ejection fraction, by estimation, is 60 to 65%. The  left ventricle has normal function. The left ventricle has no regional  wall motion abnormalities. There is mild left ventricular hypertrophy.  Left ventricular diastolic parameters  are consistent with Grade I diastolic dysfunction (impaired relaxation).  The average left ventricular global longitudinal strain is -19.2 %. The  global longitudinal strain is normal.   2. Right ventricular systolic function is low normal. The right  ventricular size is normal. There is normal pulmonary artery systolic  pressure. The estimated right ventricular systolic pressure is 7.2 mmHg.   3. The mitral valve is degenerative. Trivial mitral valve regurgitation.   4. The aortic valve is tricuspid. There is mild calcification of the  aortic valve. Aortic valve regurgitation is mild. Mild aortic valve  stenosis. Aortic regurgitation PHT measures 1034 msec. Aortic valve area,  by VTI measures 1.86 cm. Aortic valve  mean gradient measures 9.0 mmHg.   5. The inferior vena cava is normal in size with greater than 50%  respiratory variability, suggesting right atrial pressure of 3 mmHg.   Comparison(s): Prior images unable to be directly viewed.   Holter monitor 09/2015:  48-hour Holter monitor reviewed. Sinus rhythm present with heart rate ranging from 36 bpm just briefly up to 90 bpm, average heart rate 56 bpm. Lowest sustained heart rate was in the mid 40s. Occasional PACs and PVCs were noted. Brief bursts of PAT ranging 5-7 beats also noted. There were no significant pauses, no atrial fibrillation. No obvious correlating symptoms described. Risk Assessment/Calculations:    CHA2DS2-VASc Score = 5  This indicates a 7.2% annual risk of stroke. The patient's score is based  upon: CHF History: 0 HTN History: 1 Diabetes History: 1 Stroke History: 0 Vascular Disease History: 0 Age Score: 2 Gender Score: 1  Physical Exam:   VS:  BP 128/78 (BP Location: Left Arm, Patient Position: Sitting, Cuff Size: Normal)   Pulse 71   Ht 5' 3 (1.6 m)   Wt 188 lb 6.4 oz (85.5 kg)   SpO2 97%   BMI 33.37 kg/m    Wt Readings from Last 3 Encounters:  08/07/24 188 lb 6.4 oz (85.5 kg)  07/23/24 182 lb 12.8 oz (82.9 kg)  06/09/24 186 lb 3.2 oz (84.5 kg)    GEN: Well nourished, well developed in no acute distress while sitting in chair. Wearing [place] for O2.  NECK: No JVD; No carotid bruits CARDIAC: RRR, no murmurs, rubs, gallops RESPIRATORY:  Clear to auscultation without rales, wheezing or rhonchi  ABDOMEN: Soft, non-tender, non-distended EXTREMITIES:  No edema; No deformity   ASSESSMENT AND PLAN: .    Paroxysmal atrial fibrillation (HCC) Hypercoagulable state due to paroxysmal atrial fibrillation (HCC) EKG today shows no signs of afib.  On exam noted regular, rate and rhythm. Denies palpitations or active bleeding.  On  AC with CBC WNL 06/2024 Report home HR throughout the day can get as low as 40 BPM.  Decrease Toprol  XL 25 mg to 12.5 mg  daily due low HR reported and possibly contributing to fatigue.  Can consider switching to Bisoprolol with chronic lung disease. Will discuss with Dr. Mallipeddi .  Offered for patient to follow up in 1 week for repeat EKG but patient declined. Encouraged to continue monitoring HR at home and to contact office if persistently high or low.  In next OV, can repeat EKG and can discuss Zio monitor.  Continue on Eliquis  5 mg BID (dose appropriate for age 1, weight 188 kg, Cr 0.68  in 06/2024 )   First degree AV block Bradycardia Report home HR throughout the day can get as low as 40 BPM.  EKG shows NSR with stable 1st degree AV block PR 238 ms. Continue monitor.   Hyperlipidemia, LDL goal < 70 06/2024 LFT WNL, LDL 70 Continue on  lovastatin  40 mg daily  Essential hypertension, benign Reports well controlled Home BP usually SBP 120's but has not been checking lately.  BP this OV mildly elevated today: 142/82 repeat BP: 128/78 Decrease Toprol  XL 25 mg to 12.5 mg  daily as above.  Continue on Amlodipine  10 mg daily, Lasix  40 mg daily, Olmesartan  20 mg Encourage physical activity for 150 minutes per week and heart healthy low sodium diet. Discussed limiting sodium intake to < 2 grams daily.     Aortic valve stenosis, etiology of cardiac valve disease unspecified Mild aortic valve stenosis noted on most recent echocardiogram 02/2024 with mean gradient measuring 8.0 mmHg.  This has been stable since 2023. Continue to monitor  Chronic respiratory failure with hypoxia (HCC) OSA on CPAP Denies any worsening in SOB. Compliant with CPAP. Continue to follow with PCP and Dr. Darlean.   Fatigue, unspecified type Hypomagnesemia 06/2024 K/CBC WNL; TSH mildly reduced 0.316 but Levothyroxine  medication adjusted by PCP.  Order Labs (Mg, Vit D, B12)  to be completed with PCP as requested by patient.  Continue Mag Ox 400 mg daily.     Dispo: Follow up in 4-6 weeks with APP.   Signed, Lorette CINDERELLA Kapur, PA-C

## 2024-08-07 ENCOUNTER — Ambulatory Visit: Attending: Nurse Practitioner | Admitting: Physician Assistant

## 2024-08-07 ENCOUNTER — Encounter: Payer: Self-pay | Admitting: Nurse Practitioner

## 2024-08-07 ENCOUNTER — Telehealth: Payer: Self-pay | Admitting: Physician Assistant

## 2024-08-07 VITALS — BP 128/78 | HR 71 | Ht 63.0 in | Wt 188.4 lb

## 2024-08-07 DIAGNOSIS — I10 Essential (primary) hypertension: Secondary | ICD-10-CM | POA: Diagnosis not present

## 2024-08-07 DIAGNOSIS — I152 Hypertension secondary to endocrine disorders: Secondary | ICD-10-CM

## 2024-08-07 DIAGNOSIS — I440 Atrioventricular block, first degree: Secondary | ICD-10-CM

## 2024-08-07 DIAGNOSIS — I350 Nonrheumatic aortic (valve) stenosis: Secondary | ICD-10-CM

## 2024-08-07 DIAGNOSIS — I480 Paroxysmal atrial fibrillation: Secondary | ICD-10-CM | POA: Diagnosis not present

## 2024-08-07 DIAGNOSIS — E785 Hyperlipidemia, unspecified: Secondary | ICD-10-CM

## 2024-08-07 DIAGNOSIS — R001 Bradycardia, unspecified: Secondary | ICD-10-CM

## 2024-08-07 DIAGNOSIS — J9611 Chronic respiratory failure with hypoxia: Secondary | ICD-10-CM

## 2024-08-07 DIAGNOSIS — G4733 Obstructive sleep apnea (adult) (pediatric): Secondary | ICD-10-CM | POA: Diagnosis not present

## 2024-08-07 DIAGNOSIS — D6869 Other thrombophilia: Secondary | ICD-10-CM | POA: Diagnosis not present

## 2024-08-07 DIAGNOSIS — R5383 Other fatigue: Secondary | ICD-10-CM | POA: Diagnosis not present

## 2024-08-07 DIAGNOSIS — E1159 Type 2 diabetes mellitus with other circulatory complications: Secondary | ICD-10-CM | POA: Diagnosis not present

## 2024-08-07 MED ORDER — METOPROLOL SUCCINATE ER 25 MG PO TB24: 12.5000 mg | ORAL_TABLET | Freq: Every day | ORAL | 1 refills | Status: AC

## 2024-08-07 NOTE — Telephone Encounter (Signed)
 patient didn't want to schedule for one week EKG she states that she will  call back.

## 2024-08-07 NOTE — Patient Instructions (Addendum)
 Medication Instructions:  Your physician has recommended you make the following change in your medication:  Please decrease Toprol -XL to 12.5 Mg daily   Labwork: In 1 week with your PCP   Testing/Procedures: None   Follow-Up: Your physician recommends that you schedule a follow-up appointment in:  1 week nurse visit for an EKG 4-6 week   Any Other Special Instructions Will Be Listed Below (If Applicable).  If you need a refill on your cardiac medications before your next appointment, please call your pharmacy.

## 2024-08-10 DIAGNOSIS — J9611 Chronic respiratory failure with hypoxia: Secondary | ICD-10-CM | POA: Diagnosis not present

## 2024-08-10 DIAGNOSIS — R0609 Other forms of dyspnea: Secondary | ICD-10-CM | POA: Diagnosis not present

## 2024-08-13 ENCOUNTER — Encounter: Payer: Self-pay | Admitting: Dermatology

## 2024-08-13 ENCOUNTER — Ambulatory Visit: Admitting: Dermatology

## 2024-08-13 DIAGNOSIS — D235 Other benign neoplasm of skin of trunk: Secondary | ICD-10-CM | POA: Diagnosis not present

## 2024-08-13 DIAGNOSIS — L570 Actinic keratosis: Secondary | ICD-10-CM | POA: Diagnosis not present

## 2024-08-13 DIAGNOSIS — W908XXA Exposure to other nonionizing radiation, initial encounter: Secondary | ICD-10-CM | POA: Diagnosis not present

## 2024-08-13 NOTE — Patient Instructions (Addendum)

## 2024-08-13 NOTE — Progress Notes (Signed)
   Follow-Up Visit   Subjective  Danielle Parrish is a 83 y.o. female who presents for the following: growth Pt has a growth near her vagina she'd like evaluated  The following portions of the chart were reviewed this encounter and updated as appropriate: medications, allergies, medical history  Review of Systems:  No other skin or systemic complaints except as noted in HPI or Assessment and Plan.  Objective  Well appearing patient in no apparent distress; mood and affect are within normal limits.  A focused examination was performed of the following areas: vagina  Relevant exam findings are noted in the Assessment and Plan.  Right Eyebrow (2) Erythematous thin papules/macules with gritty scale.   Assessment & Plan   Angiokeratomas- Groin The patient was counseled regarding the diagnosis of angiokeratomas in the groin region, which are benign vascular skin lesions that may appear as small, dark red to purple papules. These lesions are non-cancerous and typically asymptomatic, though they may occasionally bleed, especially if irritated by shaving, friction, or trauma. The patient was reassured that treatment is not medically necessary unless the lesions are symptomatic, cause cosmetic concern, or bleed frequently. Treatment options such as cryotherapy, electrocautery, or laser therapy were discussed if removal is desired. The importance of monitoring for changes in size, color, or bleeding pattern was emphasized, and the patient was advised to return if any new or concerning skin changes occur. The patient expressed understanding and was encouraged to maintain gentle skin care in the area.  ACTINIC KERATOSIS Exam: Erythematous thin papules/macules with gritty scale  Actinic keratoses are precancerous spots that appear secondary to cumulative UV radiation exposure/sun exposure over time. They are chronic with expected duration over 1 year. A portion of actinic keratoses will progress  to squamous cell carcinoma of the skin. It is not possible to reliably predict which spots will progress to skin cancer and so treatment is recommended to prevent development of skin cancer.  Recommend daily broad spectrum sunscreen SPF 30+ to sun-exposed areas, reapply every 2 hours as needed.  Recommend staying in the shade or wearing long sleeves, sun glasses (UVA+UVB protection) and wide brim hats (4-inch brim around the entire circumference of the hat). Call for new or changing lesions.  Treatment Plan:  Prior to procedure, discussed risks of blister formation, small wound, skin dyspigmentation, or rare scar following cryotherapy. Recommend Vaseline ointment to treated areas while healing.  Destruction Procedure Note Destruction method: cryotherapy   Informed consent: discussed and consent obtained   Lesion destroyed using liquid nitrogen: Yes   Outcome: patient tolerated procedure well with no complications   Post-procedure details: wound care instructions given   Locations: right eyebrow x2 # of Lesions Treated: 2  AK (ACTINIC KERATOSIS) (2) Right Eyebrow (2) Destruction of lesion - Right Eyebrow (2) Complexity: simple   Destruction method: cryotherapy   Outcome: patient tolerated procedure well with no complications     Return if symptoms worsen or fail to improve.  I, Darice Smock, CMA, am acting as scribe for RUFUS CHRISTELLA HOLY, MD.   Documentation: I have reviewed the above documentation for accuracy and completeness, and I agree with the above.  RUFUS CHRISTELLA HOLY, MD

## 2024-08-14 ENCOUNTER — Other Ambulatory Visit

## 2024-08-14 DIAGNOSIS — R0609 Other forms of dyspnea: Secondary | ICD-10-CM | POA: Diagnosis not present

## 2024-08-14 DIAGNOSIS — G4733 Obstructive sleep apnea (adult) (pediatric): Secondary | ICD-10-CM | POA: Diagnosis not present

## 2024-08-15 ENCOUNTER — Other Ambulatory Visit

## 2024-08-15 ENCOUNTER — Ambulatory Visit: Payer: 59

## 2024-08-15 VITALS — BP 128/78 | HR 71 | Ht 63.0 in | Wt 188.0 lb

## 2024-08-15 DIAGNOSIS — D6869 Other thrombophilia: Secondary | ICD-10-CM | POA: Diagnosis not present

## 2024-08-15 DIAGNOSIS — I350 Nonrheumatic aortic (valve) stenosis: Secondary | ICD-10-CM | POA: Diagnosis not present

## 2024-08-15 DIAGNOSIS — I480 Paroxysmal atrial fibrillation: Secondary | ICD-10-CM | POA: Diagnosis not present

## 2024-08-15 DIAGNOSIS — I10 Essential (primary) hypertension: Secondary | ICD-10-CM | POA: Diagnosis not present

## 2024-08-15 DIAGNOSIS — Z0000 Encounter for general adult medical examination without abnormal findings: Secondary | ICD-10-CM | POA: Diagnosis not present

## 2024-08-15 DIAGNOSIS — E785 Hyperlipidemia, unspecified: Secondary | ICD-10-CM | POA: Diagnosis not present

## 2024-08-15 NOTE — Progress Notes (Signed)
 Subjective:   Danielle Parrish is a 83 y.o. who presents for a Medicare Wellness preventive visit.  As a reminder, Annual Wellness Visits don't include a physical exam, and some assessments may be limited, especially if this visit is performed virtually. We may recommend an in-person follow-up visit with your provider if needed.  Visit Complete: Virtual I connected with  Danielle Parrish on 08/15/24 by a audio enabled telemedicine application and verified that I am speaking with the correct person using two identifiers.  Patient Location: Home  Provider Location: Home Office  I discussed the limitations of evaluation and management by telemedicine. The patient expressed understanding and agreed to proceed.  Vital Signs: Because this visit was a virtual/telehealth visit, some criteria may be missing or patient reported. Any vitals not documented were not able to be obtained and vitals that have been documented are patient reported.  VideoDeclined- This patient declined Librarian, academic. Therefore the visit was completed with audio only.  Persons Participating in Visit: Patient.  AWV Questionnaire: No: Patient Medicare AWV questionnaire was not completed prior to this visit.  Cardiac Risk Factors include: advanced age (>52men, >26 women);dyslipidemia;hypertension;obesity (BMI >30kg/m2)     Objective:    Today's Vitals   08/15/24 0804  BP: 128/78  Pulse: 71  Weight: 188 lb (85.3 kg)  Height: 5' 3 (1.6 m)   Body mass index is 33.3 kg/m.     08/15/2024    8:11 AM 01/16/2024    3:45 PM 08/15/2023    8:08 AM 07/12/2023    2:17 PM 06/11/2023    3:55 PM 09/13/2022    2:03 PM 08/02/2022    8:12 AM  Advanced Directives  Does Patient Have a Medical Advance Directive? No No No No No No No  Would patient like information on creating a medical advance directive?   No - Patient declined    No - Patient declined    Current Medications  (verified) Outpatient Encounter Medications as of 08/15/2024  Medication Sig   acetaminophen  (TYLENOL ) 500 MG tablet Take 1 tablet (500 mg total) by mouth every 6 (six) hours as needed.   albuterol  (VENTOLIN  HFA) 108 (90 Base) MCG/ACT inhaler Take 2 puffs eveery 6 hours as needed for wheezing or shortness of breath   amLODipine  (NORVASC ) 10 MG tablet TAKE 1 TABLET BY MOUTH EVERY DAY   apixaban  (ELIQUIS ) 5 MG TABS tablet Take 1 tablet (5 mg total) by mouth 2 (two) times daily.   azelastine  (ASTELIN ) 0.1 % nasal spray Place 1 spray into both nostrils 2 (two) times daily. Use in each nostril as directed   Cholecalciferol (VITAMIN D -3 PO) Take 1 capsule by mouth daily.    diclofenac  Sodium (VOLTAREN ) 1 % GEL APPLY 4 GRAMS TOPICALLY 4 (FOUR) TIMES DAILY.   diphenhydrAMINE (BENADRYL) 25 MG tablet Take 50 mg by mouth at bedtime as needed for allergies.    Ferrous Sulfate  (IRON) 325 (65 Fe) MG TABS Take 1 tablet by mouth 3 (three) times daily.   furosemide  (LASIX ) 40 MG tablet TAKE 1 TABLET BY MOUTH EVERY DAY   gabapentin  (NEURONTIN ) 300 MG capsule Take 1 capsule (300 mg total) by mouth 3 (three) times daily.   ketoconazole  (NIZORAL ) 2 % cream APPLY TO AFFECTED AREA EVERYDAY   levothyroxine  (SYNTHROID ) 100 MCG tablet Take 1 tablet (100 mcg total) by mouth daily before breakfast.   lidocaine  (LIDODERM ) 5 % PLACE 1 PATCH ONTO THE SKIN DAILY. REMOVE & DISCARD PATCH WITHIN 12  HOURS OR AS DIRECTED BY MD   linaclotide  (LINZESS ) 145 MCG CAPS capsule Take 1 capsule (145 mcg total) by mouth as needed.   lovastatin  (MEVACOR ) 40 MG tablet Take 1 tablet (40 mg total) by mouth at bedtime.   magnesium  oxide (MAG-OX) 400 (240 Mg) MG tablet Take 1 tablet (400 mg total) by mouth daily.   metoprolol  succinate (TOPROL -XL) 25 MG 24 hr tablet Take 0.5 tablets (12.5 mg total) by mouth daily.   mirabegron  ER (MYRBETRIQ ) 25 MG TB24 tablet Take 1 tablet (25 mg total) by mouth daily.   Multiple Vitamins-Minerals (PRESERVISION  AREDS 2 PO) Take by mouth.   nystatin  (MYCOSTATIN /NYSTOP ) powder APPLY TO AFFECTED AREA 4 TIMES A DAY   olmesartan  (BENICAR ) 20 MG tablet TAKE 1 TABLET BY MOUTH EVERY DAY   omeprazole  (PRILOSEC) 20 MG capsule Take 1 capsule (20 mg total) by mouth 2 (two) times daily before a meal. (Patient taking differently: Take 20 mg by mouth daily.)   pyridOXINE (VITAMIN B-6) 100 MG tablet Take 100 mg by mouth daily.   sertraline  (ZOLOFT ) 100 MG tablet Take 2 tablets (200 mg total) by mouth daily.   sodium chloride  (OCEAN) 0.65 % SOLN nasal spray Place 1 spray into both nostrils as needed for congestion.   No facility-administered encounter medications on file as of 08/15/2024.    Allergies (verified) Nitrofurantoin , Nsaids, Penicillins, Rosuvastatin, Statins, Vytorin [ezetimibe-simvastatin], Aspirin, and Tolmetin   History: Past Medical History:  Diagnosis Date   Anxiety    Arthritis    CHF (congestive heart failure) (HCC)    Chronic pain of multiple joints    Depression    Essential hypertension    GERD (gastroesophageal reflux disease)    Hyperlipidemia    Hyperlipidemia associated with type 2 diabetes mellitus (HCC)    Hypertension associated with type 2 diabetes mellitus (HCC)    Hypothyroidism    Mild scoliosis 10/19/2021   Osteoporosis    Sleep apnea    CPAP   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, left 09/05/2023   left wrist   needs excision   Squamous cell carcinoma of skin 09/05/2023   left ear   needs mohs   Type 2 diabetes mellitus with hypercholesterolemia (HCC)    Vitamin D  deficiency    Past Surgical History:  Procedure Laterality Date   ABDOMINAL HERNIA REPAIR     ABDOMINAL HYSTERECTOMY  1977   BREAST EXCISIONAL BIOPSY Right    EYE SURGERY  2020   bilateral cataract removal    KNEE ARTHROSCOPY Left 09/30/2014   Procedure: LEFT KNEE ARTHROSCOPY MEDIAL MENISECTOMY ABRASION CHONDROPLASTY SYNOVECTOMY SUPRAPATELLER CUFF;  Surgeon: Tanda DELENA Heading, MD;  Location: [facility];   Service: Orthopedics;  Laterality: Left;   Family History  Problem Relation Age of Onset   Hypertension Mother    Stroke Mother    Cancer Father    Sleep apnea Grandson    Sleep apnea Daughter    Social History   Socioeconomic History   Marital status: Widowed    Spouse name: Rutha   Number of children: 2   Years of education: Not on file   Highest education level: High school graduate  Occupational History   Occupation: CNA    Comment: [employer]   Occupation: retired  Tobacco Use   Smoking status: Never    Passive exposure: Past   Smokeless tobacco: Never  Vaping Use   Vaping status: Never Used  Substance and Sexual Activity   Alcohol use: No  Drug use: No   Sexual activity: Not Currently  Other Topics Concern   Not on file  Social History Narrative   Lives with her daughter   Not very active socially or physically   Social Drivers of Health   Financial Resource Strain: Low Risk  (08/15/2024)   Overall Financial Resource Strain (CARDIA)    Difficulty of Paying Living Expenses: Not hard at all  Food Insecurity: No Food Insecurity (08/15/2024)   Hunger Vital Sign    Worried About Running Out of Food in the Last Year: Never true    Ran Out of Food in the Last Year: Never true  Transportation Needs: No Transportation Needs (08/15/2023)   PRAPARE - Administrator, Civil Service (Medical): No    Lack of Transportation (Non-Medical): No  Physical Activity: Insufficiently Active (08/15/2024)   Exercise Vital Sign    Days of Exercise per Week: 2 days    Minutes of Exercise per Session: 30 min  Stress: No Stress Concern Present (08/15/2024)   [employer] of Occupational Health - Occupational Stress Questionnaire    Feeling of Stress: Not at all  Social Connections: Socially Isolated (08/15/2024)   Social Connection and Isolation Panel    Frequency of Communication with Friends and Family: More than three times a week    Frequency of Social Gatherings  with Friends and Family: More than three times a week    Attends Religious Services: Never    Database administrator or Organizations: No    Attends Banker Meetings: Never    Marital Status: Widowed    Tobacco Counseling Counseling given: Yes    Clinical Intake:  Pre-visit preparation completed: Yes  Pain : No/denies pain     BMI - recorded: 33.3 Nutritional Status: BMI > 30  Obese Nutritional Risks: None Diabetes: No  Lab Results  Component Value Date   HGBA1C 5.5 07/23/2024   HGBA1C 5.8 (H) 04/23/2024   HGBA1C 5.9 (H) 01/24/2024     How often do you need to have someone help you when you read instructions, pamphlets, or other written materials from your doctor or pharmacy?: 1 - Never  Interpreter Needed?: No  Information entered by :: alia t/cma   Activities of Daily Living     08/15/2024    8:08 AM  In your present state of health, do you have any difficulty performing the following activities:  Hearing? 0  Vision? 0  Difficulty concentrating or making decisions? 0  Walking or climbing stairs? 0  Dressing or bathing? 0  Doing errands, shopping? 0  Preparing Food and eating ? N  Using the Toilet? N  In the past six months, have you accidently leaked urine? Y  Do you have problems with loss of bowel control? N  Managing your Medications? N  Managing your Finances? N  Housekeeping or managing your Housekeeping? N    Patient Care Team: Dettinger, Fonda LABOR, MD as PCP - General (Family Medicine) Mallipeddi, Diannah SQUIBB, MD as PCP - Cardiology (Cardiology) Eda Baxter, MD as Referring Physician (Dermatology) Camillo Golas, MD as Consulting Physician (Ophthalmology) Roddie Bring, DPM as Consulting Physician (Podiatry) Heide Ingle, MD as Consulting Physician (Orthopedic Surgery) Darlean Ozell NOVAK, MD as Consulting Physician (Pulmonary Disease)  I have updated your Care Teams any recent Medical Services you may have received from other  providers in the past year.     Assessment:   This is a routine wellness examination for [place].  Hearing/Vision  screen Hearing Screening - Comments:: Pt wear hearing aids Vision Screening - Comments:: Pt wear glasses/pt goes to [facility] in [place]/last ov 2024   Goals Addressed             This Visit's Progress    Patient Stated   On track    Hopes to get more active and feel better       Depression Screen     08/15/2024    8:13 AM 07/23/2024   12:39 PM 04/23/2024    3:03 PM 03/27/2024    3:14 PM 03/12/2024    8:59 AM 01/24/2024    2:20 PM 11/01/2023    9:04 AM  PHQ 2/9 Scores  PHQ - 2 Score 2 1 3 2 4 4 4   PHQ- 9 Score 7 7 10 7 18 15 15     Fall Risk     08/15/2024    8:06 AM 04/23/2024    3:03 PM 03/27/2024    3:14 PM 01/24/2024    2:20 PM 11/01/2023    9:03 AM  Fall Risk   Falls in the past year? 0 0 0 0 0  Number falls in past yr: 0 0 0    Injury with Fall? 0 0 0    Risk for fall due to : No Fall Risks Impaired balance/gait;Impaired mobility Impaired balance/gait    Follow up Falls evaluation completed Falls evaluation completed Falls evaluation completed      MEDICARE RISK AT HOME:  Medicare Risk at Home Any stairs in or around the home?: Yes If so, are there any without handrails?: Yes Home free of loose throw rugs in walkways, pet beds, electrical cords, etc?: Yes Adequate lighting in your home to reduce risk of falls?: Yes Life alert?: No Use of a cane, walker or w/c?: Yes Grab bars in the bathroom?: Yes Shower chair or bench in shower?: No Elevated toilet seat or a handicapped toilet?: Yes  TIMED UP AND GO:  Was the test performed?  no  Cognitive Function: 6CIT completed    07/23/2018    8:40 AM  MMSE - Mini Mental State Exam  Orientation to time 5  Orientation to Place 5  Registration 3  Attention/ Calculation 4  Recall 2  Language- name 2 objects 2  Language- repeat 1  Language- follow 3 step command 3  Language- read & follow  direction 1  Write a sentence 1  Copy design 1  Total score 28        08/15/2024    8:15 AM 08/15/2023    8:09 AM 08/02/2022   11:40 AM 08/01/2021    8:24 AM 07/28/2020    8:55 AM  6CIT Screen  What Year? 0 points 0 points 0 points 0 points 0 points  What month? 0 points 0 points 0 points 0 points 0 points  What time? 0 points 0 points 0 points 0 points 0 points  Count back from 20 0 points 0 points 0 points 0 points 0 points  Months in reverse 0 points 0 points 0 points 2 points 0 points  Repeat phrase 0 points 0 points 0 points 0 points 0 points  Total Score 0 points 0 points 0 points 2 points 0 points    Immunizations Immunization History  Administered Date(s) Administered   Fluad Quad(high Dose 65+) 09/20/2022   Fluad Trivalent(High Dose 65+) 10/03/2023   Influenza, High Dose Seasonal PF 11/14/2017, 10/01/2018, 12/13/2021   Influenza, Seasonal, Injecte, Preservative Fre 10/15/2013,  11/17/2014, 09/23/2015, 09/14/2016   Influenza,inj,Quad PF,6+ Mos 10/15/2013, 11/17/2014, 09/23/2015, 09/14/2016   Influenza-Unspecified 10/22/2019   Moderna Covid-19 Vaccine Bivalent Booster 67yrs & up 02/17/2022   Moderna Sars-Covid-2 Vaccination 01/17/2020, 02/14/2020, 10/29/2020   PNEUMOCOCCAL CONJUGATE-20 12/27/2022   Pneumococcal Conjugate-13 04/08/2015   Pneumococcal-Unspecified 12/26/2013   Respiratory Syncytial Virus Vaccine,Recomb Aduvanted(Arexvy) 12/05/2022   Tdap 10/15/2013, 01/24/2024   Zoster Recombinant(Shingrix ) 08/06/2019, 12/02/2019   Zoster, Live 12/26/2013    Screening Tests Health Maintenance  Topic Date Due   OPHTHALMOLOGY EXAM  12/07/2022   COVID-19 Vaccine (5 - 2024-25 season) 08/26/2023   INFLUENZA VACCINE  07/25/2024   DEXA SCAN  04/23/2025 (Originally 10/28/2020)   FOOT EXAM  10/02/2024   Diabetic kidney evaluation - Urine ACR  01/23/2025   HEMOGLOBIN A1C  01/23/2025   MAMMOGRAM  03/26/2025   Diabetic kidney evaluation - eGFR measurement  07/23/2025   Medicare  Annual Wellness (AWV)  08/15/2025   DTaP/Tdap/Td (3 - Td or Tdap) 01/23/2034   Pneumococcal Vaccine: 50+ Years  Completed   Zoster Vaccines- Shingrix   Completed   HPV VACCINES  Aged Out   Meningococcal B Vaccine  Aged Out   Hepatitis C Screening  Discontinued    Health Maintenance  Health Maintenance Due  Topic Date Due   OPHTHALMOLOGY EXAM  12/07/2022   COVID-19 Vaccine (5 - 2024-25 season) 08/26/2023   INFLUENZA VACCINE  07/25/2024   Health Maintenance Items Addressed: See Nurse Notes at the end of this note  Additional Screening:  Vision Screening: Recommended annual ophthalmology exams for early detection of glaucoma and other disorders of the eye. Would you like a referral to an eye doctor? No    Dental Screening: Recommended annual dental exams for proper oral hygiene  Community Resource Referral / Chronic Care Management: CRR required this visit?  No   CCM required this visit?  No   Plan:    I have personally reviewed and noted the following in the patient's chart:   Medical and social history Use of alcohol, tobacco or illicit drugs  Current medications and supplements including opioid prescriptions. Patient is not currently taking opioid prescriptions. Functional ability and status Nutritional status Physical activity Advanced directives List of other physicians Hospitalizations, surgeries, and ER visits in previous 12 months Vitals Screenings to include cognitive, depression, and falls Referrals and appointments  In addition, I have reviewed and discussed with patient certain preventive protocols, quality metrics, and best practice recommendations. A written personalized care plan for preventive services as well as general preventive health recommendations were provided to patient.   Ozie Ned, CMA   08/15/2024   After Visit Summary: (Declined) Due to this being a telephonic visit, with patients personalized plan was offered to patient but patient  Declined AVS at this time   Notes: Nothing significant to report at this time.

## 2024-08-15 NOTE — Patient Instructions (Signed)
 Danielle Parrish , Thank you for taking time out of your busy schedule to complete your Annual Wellness Visit with me. I enjoyed our conversation and look forward to speaking with you again next year. I, as well as your care team,  appreciate your ongoing commitment to your health goals. Please review the following plan we discussed and let me know if I can assist you in the future. Your Game plan/ To Do List    Referrals: If you haven't heard from the office you've been referred to, please reach out to them at the phone provided.   Follow up Visits: We will see or speak with you next year for your Next Medicare AWV with our clinical staff on 08/18/25 at 8:40a.m. Have you seen your provider in the last 6 months (3 months if uncontrolled diabetes)? Yes  Clinician Recommendations:  Aim for 30 minutes of exercise or brisk walking, 6-8 glasses of water, and 5 servings of fruits and vegetables each day.       This is a list of the screenings recommended for you:  Health Maintenance  Topic Date Due   Eye exam for diabetics  12/07/2022   COVID-19 Vaccine (5 - 2024-25 season) 08/26/2023   Medicare Annual Wellness Visit  08/14/2024   Flu Shot  07/25/2024   DEXA scan (bone density measurement)  04/23/2025*   Complete foot exam   10/02/2024   Yearly kidney health urinalysis for diabetes  01/23/2025   Hemoglobin A1C  01/23/2025   Mammogram  03/26/2025   Yearly kidney function blood test for diabetes  07/23/2025   DTaP/Tdap/Td vaccine (3 - Td or Tdap) 01/23/2034   Pneumococcal Vaccine for age over 2  Completed   Zoster (Shingles) Vaccine  Completed   HPV Vaccine  Aged Out   Meningitis B Vaccine  Aged Out   Hepatitis C Screening  Discontinued  *Topic was postponed. The date shown is not the original due date.    Advanced directives: (Declined) Advance directive discussed with you today. Even though you declined this today, please call our office should you change your mind, and we can give you the  proper paperwork for you to fill out. Advance Care Planning is important because it:  [x]  Makes sure you receive the medical care that is consistent with your values, goals, and preferences  [x]  It provides guidance to your family and loved ones and reduces their decisional burden about whether or not they are making the right decisions based on your wishes.  Follow the link provided in your after visit summary or read over the paperwork we have mailed to you to help you started getting your Advance Directives in place. If you need assistance in completing these, please reach out to us  so that we can help you!  See attachments for Preventive Care and Fall Prevention Tips.

## 2024-08-16 LAB — MAGNESIUM: Magnesium: 1.9 mg/dL (ref 1.6–2.3)

## 2024-08-16 LAB — VITAMIN D 25 HYDROXY (VIT D DEFICIENCY, FRACTURES): Vit D, 25-Hydroxy: 97.2 ng/mL (ref 30.0–100.0)

## 2024-08-16 LAB — VITAMIN B12: Vitamin B-12: 653 pg/mL (ref 232–1245)

## 2024-08-17 ENCOUNTER — Ambulatory Visit: Payer: Self-pay | Admitting: Physician Assistant

## 2024-08-18 ENCOUNTER — Ambulatory Visit: Attending: Family Medicine | Admitting: Physical Therapy

## 2024-08-18 ENCOUNTER — Other Ambulatory Visit: Payer: Self-pay

## 2024-08-18 DIAGNOSIS — M170 Bilateral primary osteoarthritis of knee: Secondary | ICD-10-CM | POA: Diagnosis not present

## 2024-08-18 DIAGNOSIS — R2681 Unsteadiness on feet: Secondary | ICD-10-CM | POA: Insufficient documentation

## 2024-08-18 DIAGNOSIS — M25561 Pain in right knee: Secondary | ICD-10-CM | POA: Diagnosis not present

## 2024-08-18 DIAGNOSIS — G8929 Other chronic pain: Secondary | ICD-10-CM | POA: Insufficient documentation

## 2024-08-18 DIAGNOSIS — M25562 Pain in left knee: Secondary | ICD-10-CM | POA: Diagnosis not present

## 2024-08-18 NOTE — Telephone Encounter (Signed)
 Pt returning nurse call in regards to results.

## 2024-08-18 NOTE — Therapy (Signed)
 OUTPATIENT PHYSICAL THERAPY LOWER EXTREMITY EVALUATION   Patient Name: Danielle Parrish MRN: [id number] DOB:[birth year], 83 y.o., female Today's Date: 08/18/2024  END OF SESSION:  PT End of Session - 08/18/24 1341     Visit Number 1    Number of Visits 12    Date for PT Re-Evaluation 09/29/24    PT Start Time 1100    PT Stop Time 1137    PT Time Calculation (min) 37 min    Activity Tolerance Patient tolerated treatment well    Behavior During Therapy WFL for tasks assessed/performed          Past Medical History:  Diagnosis Date   Anxiety    Arthritis    CHF (congestive heart failure) (HCC)    Chronic pain of multiple joints    Depression    Essential hypertension    GERD (gastroesophageal reflux disease)    Hyperlipidemia    Hyperlipidemia associated with type 2 diabetes mellitus (HCC)    Hypertension associated with type 2 diabetes mellitus (HCC)    Hypothyroidism    Mild scoliosis 10/19/2021   Osteoporosis    Sleep apnea    CPAP   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, left 09/05/2023   left wrist   needs excision   Squamous cell carcinoma of skin 09/05/2023   left ear   needs mohs   Type 2 diabetes mellitus with hypercholesterolemia (HCC)    Vitamin D  deficiency    Past Surgical History:  Procedure Laterality Date   ABDOMINAL HERNIA REPAIR     ABDOMINAL HYSTERECTOMY  1977   BREAST EXCISIONAL BIOPSY Right    EYE SURGERY  2020   bilateral cataract removal    KNEE ARTHROSCOPY Left 09/30/2014   Procedure: LEFT KNEE ARTHROSCOPY MEDIAL MENISECTOMY ABRASION CHONDROPLASTY SYNOVECTOMY SUPRAPATELLER CUFF;  Surgeon: Tanda DELENA Heading, MD;  Location: [facility];  Service: Orthopedics;  Laterality: Left;   Patient Active Problem List   Diagnosis Date Noted   Hospital discharge follow-up 03/13/2024   Pulmonary hypertension, primary (HCC) 03/13/2024   Chronic anticoagulation 09/07/2023   Paroxysmal atrial fibrillation (HCC) 06/19/2023   Hypercoagulable state due to  paroxysmal atrial fibrillation (HCC) 06/19/2023   Chronic respiratory failure with hypoxia (HCC) 07/10/2022   Shortness of breath 07/04/2022   Chronic midline low back pain without sciatica 01/30/2019   Age-related osteoporosis with current pathol fracture of vertebra (HCC) 10/28/2018   Calcification of aorta (HCC) 10/02/2018   Compression fracture of T12 vertebra (HCC) 10/01/2018   Primary osteoarthritis of right knee 07/20/2017   Varicose veins of left lower extremity with complications 07/31/2016   OSA (obstructive sleep apnea) 09/14/2015   Chronic diastolic heart failure (HCC) 09/10/2015   Dysphagia, pharyngoesophageal phase 08/04/2015   Anorexia 08/04/2015   Malaise and fatigue 08/04/2015   Depression, recurrent (HCC) 04/08/2015   Vitamin D  deficiency    Anxiety    GERD (gastroesophageal reflux disease)    Arthritis    Hypertension associated with type 2 diabetes mellitus (HCC) 05/15/2013   Hyperlipidemia associated with type 2 diabetes mellitus (HCC) 05/15/2013   Type 2 diabetes mellitus with hypercholesterolemia (HCC) 05/15/2013   Morbid obesity (HCC) 05/15/2013   Hypothyroidism 05/15/2013    REFERRING PROVIDER: Fonda Dettinger MD  REFERRING DIAG: Bilateral primary OA of knees.  THERAPY DIAG:  Chronic pain of left knee  Chronic pain of right knee  Unsteadiness on feet  Rationale for Evaluation and Treatment: Rehabilitation  ONSET DATE: Ongoing.    SUBJECTIVE:   SUBJECTIVE  STATEMENT: The patient presents to the clinic with c/o bilateral knee pain, left > right and a feeling of unsteadiness while walking.  She is walking with her straight cane today but states she has a Rolator which is recommended over the cane.  She states injections help her knee.  Her pain-level is a 5-6/10. She is on portable 02.    PERTINENT HISTORY: Please see above. PAIN:  Are you having pain? Yes: NPRS scale: 5-6/10.   Pain location: Knees, left > right. Pain description:  Ache. Aggravating factors: Walking.   Relieving factors: Injections.    PRECAUTIONS: Other: No recents falls but please supervise patient's gait.    RED FLAGS: None   WEIGHT BEARING RESTRICTIONS: No  FALLS:  Has patient fallen in last 6 months? No  LIVING ENVIRONMENT: Lives with: lives with their family Lives in: House/apartment Stairs: Performs in a non-reciprocating fashion.   Has following equipment at home: Single point cane.  Recommended use of her Rolator.    OCCUPATION: Retired.    PLOF: Independent  PATIENT GOALS: Improve walking.    OBJECTIVE:   PATIENT SURVEYS: LEFS:  34/80.  POSTURE: Bilateral genu varum.  PALPATION: General diffuse c/o knee pain.    LOWER EXTREMITY ROM: WFL.    LOWER EXTREMITY MMT:  Bilateral hip flexion 4+/5, abduction is normal.  Bilateral knee extension is normal.    FUNCTIONAL TESTS:  5 times sit to stand: 17 seconds. Timed up and go (TUG): 33 seconds. (-) Romberg test bu supervision required.  GAIT: Slow and cautious gait pattern with a straight cane with a decrease in step and stride length (and portable 02).                                                                                                                                 TREATMENT DATE: Nustep level 1 x 8 minutes with 02 sat post-exercise and HR 63 bpm.      PATIENT EDUCATION:  Education details:  Person educated:  International aid/development worker:  Education comprehension:   HOME EXERCISE PROGRAM:   ASSESSMENT:  CLINICAL IMPRESSION: The patient presents to [facility] with bilateral knee pain left > right and a feeling of unsteadiness on her feet.  The patient uses portable 02 and was walking with a straight cane cautiously with a decrease in step and stride length.  Her 5 time sit to stand score is 17 seconds and TUG performed on 33 seconds.  Her LEFS score is 34/80.   Patient will benefit from skilled PT intervention to address pain and deficits.   OBJECTIVE  IMPAIRMENTS: Abnormal gait, decreased activity tolerance, decreased balance, and pain.   ACTIVITY LIMITATIONS: carrying, lifting, bending, standing, and locomotion level  PARTICIPATION LIMITATIONS: meal prep, cleaning, laundry, shopping, and community activity  PERSONAL FACTORS: Time since onset of injury/illness/exacerbation and 1-2 comorbidities: OP, CHF are also affecting patient's functional outcome.   REHAB POTENTIAL: Fair plus/Good-  CLINICAL DECISION MAKING:  Evolving/moderate complexity  EVALUATION COMPLEXITY: Moderate   GOALS:  SHORT TERM GOALS: Target date: 09/01/25  Ind with an initial HEP. Goal status: INITIAL   LONG TERM GOALS: Target date: 09/29/24  Improve 5 time sit to stand by 3 seconds.  Goal status: INITIAL  2.  Improve TUG by at least 5 seconds.  Goal status: INITIAL  3.  Performed Nustep at level for 15 minutes without having to rest and 02 not dropping below 91%.  Goal status: INITIAL   PLAN:  PT FREQUENCY: 2x/week  PT DURATION: 6 weeks  PLANNED INTERVENTIONS: 97110-Therapeutic exercises, 97530- Therapeutic activity, [id number]- Neuromuscular re-education, 97535- Self Care, 02859- Manual therapy, G0283- Electrical stimulation (unattended), 97035- Ultrasound, Patient/Family education, Balance training, Stair training, Cryotherapy, and Moist heat  PLAN FOR NEXT SESSION: Nustep, gait and balance activities.    Jordie Schreur, [place], PT 08/18/2024, 2:48 PM

## 2024-08-21 ENCOUNTER — Ambulatory Visit

## 2024-08-21 DIAGNOSIS — R2681 Unsteadiness on feet: Secondary | ICD-10-CM

## 2024-08-21 DIAGNOSIS — M25561 Pain in right knee: Secondary | ICD-10-CM | POA: Diagnosis not present

## 2024-08-21 DIAGNOSIS — M170 Bilateral primary osteoarthritis of knee: Secondary | ICD-10-CM | POA: Diagnosis not present

## 2024-08-21 DIAGNOSIS — G8929 Other chronic pain: Secondary | ICD-10-CM | POA: Diagnosis not present

## 2024-08-21 DIAGNOSIS — M25562 Pain in left knee: Secondary | ICD-10-CM | POA: Diagnosis not present

## 2024-08-21 NOTE — Therapy (Signed)
 OUTPATIENT PHYSICAL THERAPY LOWER EXTREMITY EVALUATION   Patient Name: Danielle Parrish MRN: [id number] DOB:1941/02/26, 83 y.o., female Today's Date: 08/21/2024  END OF SESSION:  PT End of Session - 08/21/24 1310     Visit Number 2    Number of Visits 12    Date for PT Re-Evaluation 09/29/24    PT Start Time 1300    PT Stop Time 1345    PT Time Calculation (min) 45 min    Activity Tolerance Patient tolerated treatment well    Behavior During Therapy WFL for tasks assessed/performed          Past Medical History:  Diagnosis Date   Anxiety    Arthritis    CHF (congestive heart failure) (HCC)    Chronic pain of multiple joints    Depression    Essential hypertension    GERD (gastroesophageal reflux disease)    Hyperlipidemia    Hyperlipidemia associated with type 2 diabetes mellitus (HCC)    Hypertension associated with type 2 diabetes mellitus (HCC)    Hypothyroidism    Mild scoliosis 10/19/2021   Osteoporosis    Sleep apnea    CPAP   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, left 09/05/2023   left wrist   needs excision   Squamous cell carcinoma of skin 09/05/2023   left ear   needs mohs   Type 2 diabetes mellitus with hypercholesterolemia (HCC)    Vitamin D  deficiency    Past Surgical History:  Procedure Laterality Date   ABDOMINAL HERNIA REPAIR     ABDOMINAL HYSTERECTOMY  1977   BREAST EXCISIONAL BIOPSY Right    EYE SURGERY  2020   bilateral cataract removal    KNEE ARTHROSCOPY Left 09/30/2014   Procedure: LEFT KNEE ARTHROSCOPY MEDIAL MENISECTOMY ABRASION CHONDROPLASTY SYNOVECTOMY SUPRAPATELLER CUFF;  Surgeon: Tanda DELENA Heading, MD;  Location: [facility];  Service: Orthopedics;  Laterality: Left;   Patient Active Problem List   Diagnosis Date Noted   Hospital discharge follow-up 03/13/2024   Pulmonary hypertension, primary (HCC) 03/13/2024   Chronic anticoagulation 09/07/2023   Paroxysmal atrial fibrillation (HCC) 06/19/2023   Hypercoagulable state due to  paroxysmal atrial fibrillation (HCC) 06/19/2023   Chronic respiratory failure with hypoxia (HCC) 07/10/2022   Shortness of breath 07/04/2022   Chronic midline low back pain without sciatica 01/30/2019   Age-related osteoporosis with current pathol fracture of vertebra (HCC) 10/28/2018   Calcification of aorta (HCC) 10/02/2018   Compression fracture of T12 vertebra (HCC) 10/01/2018   Primary osteoarthritis of right knee 07/20/2017   Varicose veins of left lower extremity with complications 07/31/2016   OSA (obstructive sleep apnea) 09/14/2015   Chronic diastolic heart failure (HCC) 09/10/2015   Dysphagia, pharyngoesophageal phase 08/04/2015   Anorexia 08/04/2015   Malaise and fatigue 08/04/2015   Depression, recurrent (HCC) 04/08/2015   Vitamin D  deficiency    Anxiety    GERD (gastroesophageal reflux disease)    Arthritis    Hypertension associated with type 2 diabetes mellitus (HCC) 05/15/2013   Hyperlipidemia associated with type 2 diabetes mellitus (HCC) 05/15/2013   Type 2 diabetes mellitus with hypercholesterolemia (HCC) 05/15/2013   Morbid obesity (HCC) 05/15/2013   Hypothyroidism 05/15/2013    REFERRING PROVIDER: Fonda Dettinger MD  REFERRING DIAG: Bilateral primary OA of knees.  THERAPY DIAG:  Chronic pain of left knee  Chronic pain of right knee  Unsteadiness on feet  Rationale for Evaluation and Treatment: Rehabilitation  ONSET DATE: Ongoing.    SUBJECTIVE:   SUBJECTIVE  STATEMENT: The patient presents to the clinic with c/o bilateral knee pain, left > right and a feeling of unsteadiness while walking.  She is walking with her straight cane today but states she has a Rolator which is recommended over the cane.  She states injections help her knee.  Her pain-level is a 5-6/10. She is on portable 02.    PERTINENT HISTORY: Please see above. PAIN:  Are you having pain? Yes: NPRS scale: 5-6/10.   Pain location: Knees, left > right. Pain description:  Ache. Aggravating factors: Walking.   Relieving factors: Injections.    PRECAUTIONS: Other: No recents falls but please supervise patient's gait.    RED FLAGS: None   WEIGHT BEARING RESTRICTIONS: No  FALLS:  Has patient fallen in last 6 months? No  LIVING ENVIRONMENT: Lives with: lives with their family Lives in: House/apartment Stairs: Performs in a non-reciprocating fashion.   Has following equipment at home: Single point cane.  Recommended use of her Rolator.    OCCUPATION: Retired.    PLOF: Independent  PATIENT GOALS: Improve walking.    OBJECTIVE:   PATIENT SURVEYS: LEFS:  34/80.  POSTURE: Bilateral genu varum.  PALPATION: General diffuse c/o knee pain.    LOWER EXTREMITY ROM: WFL.    LOWER EXTREMITY MMT:  Bilateral hip flexion 4+/5, abduction is normal.  Bilateral knee extension is normal.    FUNCTIONAL TESTS:  5 times sit to stand: 17 seconds. Timed up and go (TUG): 33 seconds. (-) Romberg test bu supervision required.  GAIT: Slow and cautious gait pattern with a straight cane with a decrease in step and stride length (and portable 02).                                                                                                                                 TREATMENT DATE:    08/21/24                         EXERCISE LOG  Exercise Repetitions and Resistance Comments  Nustep Lvl 3 x 15 mins   Cybex Knee Flexion 30# x 3 mins   Cybex Knee Extension 10# x 3 mins   Seated Marches 2# x 2.5 mins x 2    Seated Hip Adduction 3 mins   Seated Hip Abduction Red x 3.5 mins   Seated Ham Curls Red x 20 reps bil    Blank cell = exercise not performed today   Nustep level 1 x 8 minutes with 02 sat post-exercise and HR 63 bpm.      PATIENT EDUCATION:  Education details:  Person educated:  International aid/development worker:  Education comprehension:   HOME EXERCISE PROGRAM:   ASSESSMENT:  CLINICAL IMPRESSION: Pt arrives for today's treatment session denying any  pain, but does report fatigue.  Pt able to tolerate Nustep for warm up today without O2 stats dropping below 90%.  Pt also introduced  to cybex knee flexion and extension with all vitals remaining WNL.  Pt requiring min cues for proper technique with all seated exercises.  Pt denied any pain at completion of today's treatment session, but does report being tired.  OBJECTIVE IMPAIRMENTS: Abnormal gait, decreased activity tolerance, decreased balance, and pain.   ACTIVITY LIMITATIONS: carrying, lifting, bending, standing, and locomotion level  PARTICIPATION LIMITATIONS: meal prep, cleaning, laundry, shopping, and community activity  PERSONAL FACTORS: Time since onset of injury/illness/exacerbation and 1-2 comorbidities: OP, CHF are also affecting patient's functional outcome.   REHAB POTENTIAL: Fair plus/Good-  CLINICAL DECISION MAKING: Evolving/moderate complexity  EVALUATION COMPLEXITY: Moderate   GOALS:  SHORT TERM GOALS: Target date: 09/01/25  Ind with an initial HEP. Goal status: INITIAL   LONG TERM GOALS: Target date: 09/29/24  Improve 5 time sit to stand by 3 seconds.  Goal status: INITIAL  2.  Improve TUG by at least 5 seconds.  Goal status: INITIAL  3.  Performed Nustep at level for 15 minutes without having to rest and 02 not dropping below 91%.  Goal status: INITIAL   PLAN:  PT FREQUENCY: 2x/week  PT DURATION: 6 weeks  PLANNED INTERVENTIONS: 97110-Therapeutic exercises, 97530- Therapeutic activity, [id number]- Neuromuscular re-education, 97535- Self Care, 02859- Manual therapy, G0283- Electrical stimulation (unattended), 97035- Ultrasound, Patient/Family education, Balance training, Stair training, Cryotherapy, and Moist heat  PLAN FOR NEXT SESSION: Nustep, gait and balance activities.    Delon DELENA Gosling, PTA 08/21/2024, 1:58 PM

## 2024-08-22 ENCOUNTER — Ambulatory Visit: Payer: Self-pay

## 2024-08-22 NOTE — Telephone Encounter (Signed)
 Yes if she will go into the ER then please see if she can make an appointment, I think all her appointments here are full so she may have to go to an urgent care but I think she needs to be seen

## 2024-08-22 NOTE — Telephone Encounter (Signed)
 Called and spoke with patient. Advised that she may need to go to Urgent Care. She states that she is already in the bed and she is feeling a little better and if needed she would go there over the weekend, Advised that if she was unable to go then contact us  Tuesday morning for an appt. Patient verbalized understanding

## 2024-08-22 NOTE — Telephone Encounter (Signed)
 FYI Only or Action Required?: Action required by provider: Refusing 911, needs call back with further recommendations, alerted [facility] to ED refusal. - Limited info gathered  Patient was last seen in primary care on 07/23/2024 by Dettinger, Fonda LABOR, MD.  Called Nurse Triage reporting Weakness, severe sore throat, tongue feels coated but not looked at it, and vague chest discomfort just different all the time.  Symptoms began yesterday.  Interventions attempted: Nothing.  Symptoms are: rapidly worsening.  Triage Disposition: Call EMS 911 Now  Patient/caregiver understands and will follow disposition?: No, refuses disposition     Alerted [facility] to ED refusal  Copied from CRM #[id number]. Topic: Clinical - Red Word Triage >> Aug 22, 2024 11:02 AM Deaijah H wrote: Red Word that prompted transfer to Nurse Triage: Throat/Chest pain (heavy), tongue feels coated with something. Reason for Disposition  [1] Chest pain lasts > 5 minutes AND [2] age > 77  Answer Assessment - Initial Assessment Questions Pt is poor historian  1. LOCATION: Where does it hurt?       Don't really hurt just feels different, heaviness I guess sort of 2. RADIATION: Does the pain go anywhere else? (e.g., into neck, jaw, arms, back)     no 3. ONSET: When did the chest pain begin? (Minutes, hours or days)      Yesterday, tongue thing 3-4 days sooner 4. PATTERN: Does the pain come and go, or has it been constant since it started?  Does it get worse with exertion?      Pt answer to question: Throat hurting, been asleep most of the day and yesterday and day before, Have tried to rest and don't feel like doing anything Nurse asked if feeling weaker than usual, pt responds Always weak anyway but weaker than usual Nurse asked question again about pattern with chest discomfort, pt responds: It's just different all the time, present all the time but changes a lot 5. DURATION: How long does it last (e.g., seconds,  minutes, hours)     Pt answer to question: Just got up so throat hurts more than anything, can hardly talk, feel so weak 6. SEVERITY: How bad is the pain?  (e.g., Scale 1-10; mild, moderate, or severe)     8/10 in throat 7. CARDIAC RISK FACTORS: Do you have any history of heart problems or risk factors for heart disease? (e.g., angina, prior heart attack; diabetes, high blood pressure, high cholesterol, smoker, or strong family history of heart disease)     significant 8. PULMONARY RISK FACTORS: Do you have any history of lung disease?  (e.g., blood clots in lung, asthma, emphysema, birth control pills)     significant 10. OTHER SYMPTOMS: Do you have any other symptoms? (e.g., dizziness, nausea, vomiting, sweating, fever, difficulty breathing, cough)       Runny nose, sore throat, chest hurting more today, tongue is coated don't know the color just feel it not looked it No SOB or dizziness, on oxygen  3L  Would rather not go to hospital, not wanting to call 911, just don't want to do that    Advised call 911 for symptoms and transportation given limited info about symptoms, pt refusing, sending message to PCP for call back to pt with further recommendations, advised 911 if any worsening.  Protocols used: Chest Pain-A-AH

## 2024-08-28 ENCOUNTER — Ambulatory Visit: Attending: Family Medicine

## 2024-08-28 DIAGNOSIS — M25561 Pain in right knee: Secondary | ICD-10-CM | POA: Insufficient documentation

## 2024-08-28 DIAGNOSIS — R2681 Unsteadiness on feet: Secondary | ICD-10-CM | POA: Diagnosis not present

## 2024-08-28 DIAGNOSIS — M25562 Pain in left knee: Secondary | ICD-10-CM | POA: Insufficient documentation

## 2024-08-28 DIAGNOSIS — G8929 Other chronic pain: Secondary | ICD-10-CM | POA: Diagnosis not present

## 2024-08-28 NOTE — Therapy (Signed)
 OUTPATIENT PHYSICAL THERAPY LOWER EXTREMITY TREATMENT   Patient Name: Danielle Parrish MRN: [id number] DOB:1941/08/28, 83 y.o., female Today's Date: 08/28/2024  END OF SESSION:  PT End of Session - 08/28/24 1357     Visit Number 3    Number of Visits 12    Date for PT Re-Evaluation 09/29/24    PT Start Time 1354    PT Stop Time 1443    PT Time Calculation (min) 49 min    Activity Tolerance Patient tolerated treatment well    Behavior During Therapy WFL for tasks assessed/performed          Past Medical History:  Diagnosis Date   Anxiety    Arthritis    CHF (congestive heart failure) (HCC)    Chronic pain of multiple joints    Depression    Essential hypertension    GERD (gastroesophageal reflux disease)    Hyperlipidemia    Hyperlipidemia associated with type 2 diabetes mellitus (HCC)    Hypertension associated with type 2 diabetes mellitus (HCC)    Hypothyroidism    Mild scoliosis 10/19/2021   Osteoporosis    Sleep apnea    CPAP   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, left 09/05/2023   left wrist   needs excision   Squamous cell carcinoma of skin 09/05/2023   left ear   needs mohs   Type 2 diabetes mellitus with hypercholesterolemia (HCC)    Vitamin D  deficiency    Past Surgical History:  Procedure Laterality Date   ABDOMINAL HERNIA REPAIR     ABDOMINAL HYSTERECTOMY  1977   BREAST EXCISIONAL BIOPSY Right    EYE SURGERY  2020   bilateral cataract removal    KNEE ARTHROSCOPY Left 09/30/2014   Procedure: LEFT KNEE ARTHROSCOPY MEDIAL MENISECTOMY ABRASION CHONDROPLASTY SYNOVECTOMY SUPRAPATELLER CUFF;  Surgeon: Tanda DELENA Heading, MD;  Location: [facility];  Service: Orthopedics;  Laterality: Left;   Patient Active Problem List   Diagnosis Date Noted   Hospital discharge follow-up 03/13/2024   Pulmonary hypertension, primary (HCC) 03/13/2024   Chronic anticoagulation 09/07/2023   Paroxysmal atrial fibrillation (HCC) 06/19/2023   Hypercoagulable state due to  paroxysmal atrial fibrillation (HCC) 06/19/2023   Chronic respiratory failure with hypoxia (HCC) 07/10/2022   Shortness of breath 07/04/2022   Chronic midline low back pain without sciatica 01/30/2019   Age-related osteoporosis with current pathol fracture of vertebra (HCC) 10/28/2018   Calcification of aorta (HCC) 10/02/2018   Compression fracture of T12 vertebra (HCC) 10/01/2018   Primary osteoarthritis of right knee 07/20/2017   Varicose veins of left lower extremity with complications 07/31/2016   OSA (obstructive sleep apnea) 09/14/2015   Chronic diastolic heart failure (HCC) 09/10/2015   Dysphagia, pharyngoesophageal phase 08/04/2015   Anorexia 08/04/2015   Malaise and fatigue 08/04/2015   Depression, recurrent (HCC) 04/08/2015   Vitamin D  deficiency    Anxiety    GERD (gastroesophageal reflux disease)    Arthritis    Hypertension associated with type 2 diabetes mellitus (HCC) 05/15/2013   Hyperlipidemia associated with type 2 diabetes mellitus (HCC) 05/15/2013   Type 2 diabetes mellitus with hypercholesterolemia (HCC) 05/15/2013   Morbid obesity (HCC) 05/15/2013   Hypothyroidism 05/15/2013    REFERRING PROVIDER: Fonda Dettinger MD  REFERRING DIAG: Bilateral primary OA of knees.  THERAPY DIAG:  Chronic pain of left knee  Chronic pain of right knee  Unsteadiness on feet  Rationale for Evaluation and Treatment: Rehabilitation  ONSET DATE: Ongoing.    SUBJECTIVE:   SUBJECTIVE  STATEMENT: Pt reports 6/10 low back pain today.  Pt also reports generally not feeling well and worn out.  PERTINENT HISTORY: Please see above. PAIN:  Are you having pain? Yes: NPRS scale: 6/10.   Pain location: low back Pain description: Ache. Aggravating factors: Walking.   Relieving factors: Injections.    PRECAUTIONS: Other: No recents falls but please supervise patient's gait.    RED FLAGS: None   WEIGHT BEARING RESTRICTIONS: No  FALLS:  Has patient fallen in last 6  months? No  LIVING ENVIRONMENT: Lives with: lives with their family Lives in: House/apartment Stairs: Performs in a non-reciprocating fashion.   Has following equipment at home: Single point cane.  Recommended use of her Rolator.    OCCUPATION: Retired.    PLOF: Independent  PATIENT GOALS: Improve walking.    OBJECTIVE:   PATIENT SURVEYS: LEFS:  34/80.  POSTURE: Bilateral genu varum.  PALPATION: General diffuse c/o knee pain.    LOWER EXTREMITY ROM: WFL.    LOWER EXTREMITY MMT:  Bilateral hip flexion 4+/5, abduction is normal.  Bilateral knee extension is normal.    FUNCTIONAL TESTS:  5 times sit to stand: 17 seconds. Timed up and go (TUG): 33 seconds. (-) Romberg test bu supervision required.  GAIT: Slow and cautious gait pattern with a straight cane with a decrease in step and stride length (and portable 02).                                                                                                                                 TREATMENT DATE:    08/28/24                         EXERCISE LOG  Exercise Repetitions and Resistance Comments  Nustep Lvl 3 x 15 mins   Cybex Knee Flexion 30# x 4 mins   Cybex Knee Extension 10# x 4 mins   Seated Marches 2# x 2.5 mins x 2    Seated Hip Adduction    Seated Hip Abduction    Seated Ham Curls Red x 20 reps bil    Blank cell = exercise not performed today   Nustep level 1 x 8 minutes with 02 sat post-exercise and HR 63 bpm.      PATIENT EDUCATION:  Education details:  Person educated:  International aid/development worker:  Education comprehension:   HOME EXERCISE PROGRAM:   ASSESSMENT:  CLINICAL IMPRESSION: Pt arrives for today's treatment session reporting 6/10 low back pain and general malaise.  Pt able to tolerate increased time on the cybex machinery today.  Discussed the importance of activity planning and energy conservation to assist with completion of ADLs.  Pt SpO2 remaining greater that 95% on normal O2 levels.  Pt  reported minimal decrease in pain at completion of today's treatment session.  OBJECTIVE IMPAIRMENTS: Abnormal gait, decreased activity tolerance, decreased balance, and pain.   ACTIVITY LIMITATIONS: carrying,  lifting, bending, standing, and locomotion level  PARTICIPATION LIMITATIONS: meal prep, cleaning, laundry, shopping, and community activity  PERSONAL FACTORS: Time since onset of injury/illness/exacerbation and 1-2 comorbidities: OP, CHF are also affecting patient's functional outcome.   REHAB POTENTIAL: Fair plus/Good-  CLINICAL DECISION MAKING: Evolving/moderate complexity  EVALUATION COMPLEXITY: Moderate   GOALS:  SHORT TERM GOALS: Target date: 09/01/25  Ind with an initial HEP. Goal status: INITIAL   LONG TERM GOALS: Target date: 09/29/24  Improve 5 time sit to stand by 3 seconds.  Goal status: INITIAL  2.  Improve TUG by at least 5 seconds.  Goal status: INITIAL  3.  Performed Nustep at level for 15 minutes without having to rest and 02 not dropping below 91%.  Goal status: INITIAL   PLAN:  PT FREQUENCY: 2x/week  PT DURATION: 6 weeks  PLANNED INTERVENTIONS: 97110-Therapeutic exercises, 97530- Therapeutic activity, [id number]- Neuromuscular re-education, 97535- Self Care, 02859- Manual therapy, G0283- Electrical stimulation (unattended), 97035- Ultrasound, Patient/Family education, Balance training, Stair training, Cryotherapy, and Moist heat  PLAN FOR NEXT SESSION: Nustep, gait and balance activities.    Delon DELENA Gosling, PTA 08/28/2024, 3:37 PM

## 2024-09-04 ENCOUNTER — Ambulatory Visit

## 2024-09-04 DIAGNOSIS — G8929 Other chronic pain: Secondary | ICD-10-CM

## 2024-09-04 DIAGNOSIS — R2681 Unsteadiness on feet: Secondary | ICD-10-CM

## 2024-09-04 DIAGNOSIS — M25562 Pain in left knee: Secondary | ICD-10-CM | POA: Diagnosis not present

## 2024-09-04 DIAGNOSIS — M25561 Pain in right knee: Secondary | ICD-10-CM | POA: Diagnosis not present

## 2024-09-04 NOTE — Therapy (Signed)
 OUTPATIENT PHYSICAL THERAPY LOWER EXTREMITY TREATMENT   Patient Name: Danielle Parrish MRN: [id number] DOB:01/18/1941, 83 y.o., female Today's Date: 09/04/2024  END OF SESSION:  PT End of Session - 09/04/24 1437     Visit Number 4    Number of Visits 12    Date for PT Re-Evaluation 09/29/24    PT Start Time 1432    PT Stop Time 1523    PT Time Calculation (min) 51 min    Activity Tolerance Patient tolerated treatment well    Behavior During Therapy WFL for tasks assessed/performed          Past Medical History:  Diagnosis Date   Anxiety    Arthritis    CHF (congestive heart failure) (HCC)    Chronic pain of multiple joints    Depression    Essential hypertension    GERD (gastroesophageal reflux disease)    Hyperlipidemia    Hyperlipidemia associated with type 2 diabetes mellitus (HCC)    Hypertension associated with type 2 diabetes mellitus (HCC)    Hypothyroidism    Mild scoliosis 10/19/2021   Osteoporosis    Sleep apnea    CPAP   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, left 09/05/2023   left wrist   needs excision   Squamous cell carcinoma of skin 09/05/2023   left ear   needs mohs   Type 2 diabetes mellitus with hypercholesterolemia (HCC)    Vitamin D  deficiency    Past Surgical History:  Procedure Laterality Date   ABDOMINAL HERNIA REPAIR     ABDOMINAL HYSTERECTOMY  1977   BREAST EXCISIONAL BIOPSY Right    EYE SURGERY  2020   bilateral cataract removal    KNEE ARTHROSCOPY Left 09/30/2014   Procedure: LEFT KNEE ARTHROSCOPY MEDIAL MENISECTOMY ABRASION CHONDROPLASTY SYNOVECTOMY SUPRAPATELLER CUFF;  Surgeon: Tanda DELENA Heading, MD;  Location: [facility];  Service: Orthopedics;  Laterality: Left;   Patient Active Problem List   Diagnosis Date Noted   Hospital discharge follow-up 03/13/2024   Pulmonary hypertension, primary (HCC) 03/13/2024   Chronic anticoagulation 09/07/2023   Paroxysmal atrial fibrillation (HCC) 06/19/2023   Hypercoagulable state due to  paroxysmal atrial fibrillation (HCC) 06/19/2023   Chronic respiratory failure with hypoxia (HCC) 07/10/2022   Shortness of breath 07/04/2022   Chronic midline low back pain without sciatica 01/30/2019   Age-related osteoporosis with current pathol fracture of vertebra (HCC) 10/28/2018   Calcification of aorta (HCC) 10/02/2018   Compression fracture of T12 vertebra (HCC) 10/01/2018   Primary osteoarthritis of right knee 07/20/2017   Varicose veins of left lower extremity with complications 07/31/2016   OSA (obstructive sleep apnea) 09/14/2015   Chronic diastolic heart failure (HCC) 09/10/2015   Dysphagia, pharyngoesophageal phase 08/04/2015   Anorexia 08/04/2015   Malaise and fatigue 08/04/2015   Depression, recurrent (HCC) 04/08/2015   Vitamin D  deficiency    Anxiety    GERD (gastroesophageal reflux disease)    Arthritis    Hypertension associated with type 2 diabetes mellitus (HCC) 05/15/2013   Hyperlipidemia associated with type 2 diabetes mellitus (HCC) 05/15/2013   Type 2 diabetes mellitus with hypercholesterolemia (HCC) 05/15/2013   Morbid obesity (HCC) 05/15/2013   Hypothyroidism 05/15/2013    REFERRING PROVIDER: Fonda Dettinger MD  REFERRING DIAG: Bilateral primary OA of knees.  THERAPY DIAG:  Chronic pain of left knee  Chronic pain of right knee  Unsteadiness on feet  Rationale for Evaluation and Treatment: Rehabilitation  ONSET DATE: Ongoing.    SUBJECTIVE:   SUBJECTIVE  STATEMENT: Pt denies any pain today, but does report fatigue today.  PERTINENT HISTORY: Please see above. PAIN:  Are you having pain? No  PRECAUTIONS: Other: No recents falls but please supervise patient's gait.    RED FLAGS: None   WEIGHT BEARING RESTRICTIONS: No  FALLS:  Has patient fallen in last 6 months? No  LIVING ENVIRONMENT: Lives with: lives with their family Lives in: House/apartment Stairs: Performs in a non-reciprocating fashion.   Has following equipment at home:  Single point cane.  Recommended use of her Rolator.    OCCUPATION: Retired.    PLOF: Independent  PATIENT GOALS: Improve walking.    OBJECTIVE:   PATIENT SURVEYS: LEFS:  34/80.  POSTURE: Bilateral genu varum.  PALPATION: General diffuse c/o knee pain.    LOWER EXTREMITY ROM: WFL.    LOWER EXTREMITY MMT:  Bilateral hip flexion 4+/5, abduction is normal.  Bilateral knee extension is normal.    FUNCTIONAL TESTS:  5 times sit to stand: 17 seconds. Timed up and go (TUG): 33 seconds. (-) Romberg test bu supervision required.  GAIT: Slow and cautious gait pattern with a straight cane with a decrease in step and stride length (and portable 02).                                                                                                                                 TREATMENT DATE:    09/04/24                         EXERCISE LOG  Exercise Repetitions and Resistance Comments  Nustep Lvl 3 x 15 mins   Cybex Knee Flexion 40# x 5 mins   Cybex Knee Extension 10# x 5 mins   Seated Marches 2# x 2.5 mins x 2    Seated Hip Adduction    Seated Hip Abduction    Seated Ham Curls Red x 20 reps bil    Blank cell = exercise not performed today   Nustep level 1 x 8 minutes with 02 sat post-exercise and HR 63 bpm.      PATIENT EDUCATION:  Education details:  Person educated:  International aid/development worker:  Education comprehension:   HOME EXERCISE PROGRAM:   ASSESSMENT:  CLINICAL IMPRESSION: Pt arrives for today's treatment session denying any pain, but does reports increased fatigue. Pt able to tolerate increased time for cybex exercises today.  Pt performed seated exercises to fatigue with good results.  Pt's SpO2 > 90% throughout treatment session on baseline O2.  Pt denied any pain at completion of today's treatment session.  OBJECTIVE IMPAIRMENTS: Abnormal gait, decreased activity tolerance, decreased balance, and pain.   ACTIVITY LIMITATIONS: carrying, lifting, bending,  standing, and locomotion level  PARTICIPATION LIMITATIONS: meal prep, cleaning, laundry, shopping, and community activity  PERSONAL FACTORS: Time since onset of injury/illness/exacerbation and 1-2 comorbidities: OP, CHF are also affecting patient's functional outcome.   REHAB POTENTIAL:  Fair plus/Good-  CLINICAL DECISION MAKING: Evolving/moderate complexity  EVALUATION COMPLEXITY: Moderate   GOALS:  SHORT TERM GOALS: Target date: 09/01/25  Ind with an initial HEP. Goal status: INITIAL   LONG TERM GOALS: Target date: 09/29/24  Improve 5 time sit to stand by 3 seconds.  Goal status: INITIAL  2.  Improve TUG by at least 5 seconds.  Goal status: INITIAL  3.  Performed Nustep at level for 15 minutes without having to rest and 02 not dropping below 91%.  Goal status: INITIAL   PLAN:  PT FREQUENCY: 2x/week  PT DURATION: 6 weeks  PLANNED INTERVENTIONS: 97110-Therapeutic exercises, 97530- Therapeutic activity, [id number]- Neuromuscular re-education, 97535- Self Care, 02859- Manual therapy, G0283- Electrical stimulation (unattended), 97035- Ultrasound, Patient/Family education, Balance training, Stair training, Cryotherapy, and Moist heat  PLAN FOR NEXT SESSION: Nustep, gait and balance activities.    Delon DELENA Gosling, PTA 09/04/2024, 4:02 PM

## 2024-09-10 DIAGNOSIS — R0609 Other forms of dyspnea: Secondary | ICD-10-CM | POA: Diagnosis not present

## 2024-09-13 NOTE — Progress Notes (Deleted)
 Danielle Parrish, female    DOB: 01/21/1941   MRN: [id number]   Brief patient profile:  47 yowf never smoker  referred to pulmonary clinic 07/04/2022 by Dr Buck  for cough p shoulder injury requiring GA and surgery in Aug of 2022.  [employer] shopping still able to do groceries leaning on cart / mb and back flat x 75 ft with walker   History of Present Illness  07/04/2022  Pulmonary/ 1st office eval/Gal Feldhaus on Macrodantin  Chief Complaint  Patient presents with   Consult    Pt states she has some SOB and a runny nose x1 year. Pt states she has Oxygen  and CPAP machine but she does not use the CPAP machine.   Dyspnea:  mb and back slowly = MMRC3 = can't walk 100 yards even at a slow pace at a flat grade s stopping due to sob   Cough: voice fatigue  Sleep: cpap/02  SABA use: minimal better    Rec You will need to get the echocardiogram asap - call this office if can't schedule it within  a week  Stop macrodantin   Please schedule a follow up office visit in 3- 4 weeks, sooner if needed  - [place] [facility]  - bring all medications Late add: pt instructed on use of amb 02 = goal is to keep > 90% saturations at all times     08/22/2022  f/u ov/[place] office/Danielle Parrish re: doe maint on ?  (Did not bring meds)   Chief Complaint  Patient presents with   Follow-up    Feels breathing is not doing well. Knees are bothering patient today.   Dyspnea:  walking mailbox and back but not checking sats despite feeling breathing worse  Cough: none but does have mild globus sensation on ACEi Sleeping: cpap/02 sleeping 0k SABA use: not really helping sob  02: not using x during the noct  Rec Stop lisinopril   and start olmesartan  20 mg on daily in its place to see if helps breathing Amlodipine  may be making your swell up and need to reduce the dose if blood pressure too low  Make sure you check your oxygen  saturation  AT  your highest level of activity (not after you stop)   to be sure it stays over  90%        03/09/2023  f/u ov/[place] office/Danielle Parrish re: doe/ AIAR  maint on prn saba    Chief Complaint  Patient presents with   Follow-up    Pt f/u she states that she is having difficulty walking, SOB and low oxygen  sats  Dyspnea:  more limited by R hip than breathing/ mb and back on RA does not check 02 while walking there also doing steps 10 x daily stops at top but not using 02 then either or monitoring sats Cough: none  Sleeping: lost remote, bed flat / 2 pillows = baseline  SABA use: albuterol  once a day 02: 2lpm and cpap fine  Rec Make sure you check your oxygen  saturation  AT  your highest level of activity (not after you stop)   to be sure it stays over 90%   We need to work on keeping your blood pressure lower due to your valvular heart problem     03/06/2024 ACUTE ov/[place] office/Danielle Parrish re: doe/valvular heart dz maint on 02  Cc: just tired  Dyspnea:  room to room doe /  twice weekly  PT with desats reported on POC  Cough: not a problem  Sleeping: bed is flat/ lots of pillows s resp cc  on cpap per neuro  > tired on awakening  SABA use: none 02: 3lpm in cpap/ 5lp POC during the day  Rec Make sure you check your oxygen  saturation  AT  your highest level of activity (not after you stop)   to be sure it stays over 90%  Keep appt with Dr Lord and cardiology   Late add:  needs 4lpm continuous when walking or doing PT (best fit referral)  >>> sent to [facility]    06/09/2024  f/u ov/[place] office/Danielle Parrish re: doe/valvular heart dz/ maint on 02   Chief Complaint  Patient presents with   Follow-up   Shortness of Breath   Dyspnea:  reports walking up and down steps s desats on 4lpm POC  Cough: none  Sleeping: cpap/ 02 3lpm  s  resp cc  SABA use: not using  02: 3lpm/cpap HS  / 3lpm conc living room and  4lpm POC otherwise but did not bring it today and other portable system(tank) is broken per pt   Rec Make sure you check your oxygen  saturation  AT  your  highest level of activity (not after you stop)   to be sure it stays over 90%      09/15/2024  f/u ov/[place] office/Danielle Parrish re: *** maint on ***  No chief complaint on file.   Dyspnea:  *** Cough: *** Sleeping: ***   resp cc  SABA use: *** 02: ***  Lung cancer screening: ***   No obvious day to day or daytime variability or assoc excess/ purulent sputum or mucus plugs or hemoptysis or cp or chest tightness, subjective wheeze or overt sinus or hb symptoms.    Also denies any obvious fluctuation of symptoms with weather or environmental changes or other aggravating or alleviating factors except as outlined above   No unusual exposure hx or h/o childhood pna/ asthma or knowledge of premature birth.  Current Allergies, Complete Past Medical History, Past Surgical History, Family History, and Social History were reviewed in [employer] record.  ROS  The following are not active complaints unless bolded Hoarseness, sore throat, dysphagia, dental problems, itching, sneezing,  nasal congestion or discharge of excess mucus or purulent secretions, ear ache,   fever, chills, sweats, unintended wt loss or wt gain, classically pleuritic or exertional cp,  orthopnea pnd or arm/hand swelling  or leg swelling, presyncope, palpitations, abdominal pain, anorexia, nausea, vomiting, diarrhea  or change in bowel habits or change in bladder habits, change in stools or change in urine, dysuria, hematuria,  rash, arthralgias, visual complaints, headache, numbness, weakness or ataxia or problems with walking or coordination,  change in mood or  memory.        No outpatient medications have been marked as taking for the 09/15/24 encounter (Appointment) with Danielle Ozell NOVAK, MD.              Past Medical History:  Diagnosis Date   Anxiety    Arthritis    CHF (congestive heart failure) (HCC)    Chronic pain of multiple joints    Depression    Essential hypertension    GERD  (gastroesophageal reflux disease)    Hyperlipidemia    Hyperlipidemia associated with type 2 diabetes mellitus (HCC)    Hypertension associated with type 2 diabetes mellitus (HCC)    Hypothyroidism    Mild scoliosis 10/19/2021   Osteoporosis    Sleep apnea  CPAP   Type 2 diabetes mellitus with hypercholesterolemia (HCC)    Vitamin D  deficiency        Objective:   Wts  06/09/2024       186   03/06/2024       188  06/11/2023       181  03/09/2023       180   02/06/2023       181   11/14/2022     179   08/22/22 173 lb 12.8 oz (78.8 kg)  08/02/22 172 lb (78 kg)  07/04/22 173 lb 3.2 oz (78.6 kg)    Vital signs reviewed  09/15/2024  - Note at rest 02 sats  ***% on ***   General appearance:    ***   2/6 SEM***      Assessment

## 2024-09-14 DIAGNOSIS — I509 Heart failure, unspecified: Secondary | ICD-10-CM | POA: Diagnosis not present

## 2024-09-14 DIAGNOSIS — G4733 Obstructive sleep apnea (adult) (pediatric): Secondary | ICD-10-CM | POA: Diagnosis not present

## 2024-09-14 DIAGNOSIS — R0609 Other forms of dyspnea: Secondary | ICD-10-CM | POA: Diagnosis not present

## 2024-09-15 ENCOUNTER — Ambulatory Visit: Admitting: Internal Medicine

## 2024-09-15 DIAGNOSIS — R0602 Shortness of breath: Secondary | ICD-10-CM

## 2024-09-17 ENCOUNTER — Other Ambulatory Visit: Payer: Self-pay | Admitting: Dermatology

## 2024-09-17 DIAGNOSIS — L570 Actinic keratosis: Secondary | ICD-10-CM

## 2024-09-18 ENCOUNTER — Ambulatory Visit

## 2024-09-18 DIAGNOSIS — R2681 Unsteadiness on feet: Secondary | ICD-10-CM

## 2024-09-18 DIAGNOSIS — M25562 Pain in left knee: Secondary | ICD-10-CM | POA: Diagnosis not present

## 2024-09-18 DIAGNOSIS — G8929 Other chronic pain: Secondary | ICD-10-CM

## 2024-09-18 DIAGNOSIS — M25561 Pain in right knee: Secondary | ICD-10-CM | POA: Diagnosis not present

## 2024-09-18 NOTE — Therapy (Signed)
 OUTPATIENT PHYSICAL THERAPY LOWER EXTREMITY TREATMENT   Patient Name: Danielle Parrish MRN: [id number] DOB:May 08, 1941, 83 y.o., female Today's Date: 09/18/2024  END OF SESSION:  PT End of Session - 09/18/24 1406     Visit Number 5    Number of Visits 12    Date for Recertification  09/29/24    PT Start Time 1400    PT Stop Time 1447    PT Time Calculation (min) 47 min    Activity Tolerance Patient tolerated treatment well    Behavior During Therapy WFL for tasks assessed/performed          Past Medical History:  Diagnosis Date   Anxiety    Arthritis    CHF (congestive heart failure) (HCC)    Chronic pain of multiple joints    Depression    Essential hypertension    GERD (gastroesophageal reflux disease)    Hyperlipidemia    Hyperlipidemia associated with type 2 diabetes mellitus (HCC)    Hypertension associated with type 2 diabetes mellitus (HCC)    Hypothyroidism    Mild scoliosis 10/19/2021   Osteoporosis    Sleep apnea    CPAP   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, left 09/05/2023   left wrist   needs excision   Squamous cell carcinoma of skin 09/05/2023   left ear   needs mohs   Type 2 diabetes mellitus with hypercholesterolemia (HCC)    Vitamin D  deficiency    Past Surgical History:  Procedure Laterality Date   ABDOMINAL HERNIA REPAIR     ABDOMINAL HYSTERECTOMY  1977   BREAST EXCISIONAL BIOPSY Right    EYE SURGERY  2020   bilateral cataract removal    KNEE ARTHROSCOPY Left 09/30/2014   Procedure: LEFT KNEE ARTHROSCOPY MEDIAL MENISECTOMY ABRASION CHONDROPLASTY SYNOVECTOMY SUPRAPATELLER CUFF;  Surgeon: Tanda DELENA Heading, MD;  Location: [facility];  Service: Orthopedics;  Laterality: Left;   Patient Active Problem List   Diagnosis Date Noted   Hospital discharge follow-up 03/13/2024   Pulmonary hypertension, primary (HCC) 03/13/2024   Chronic anticoagulation 09/07/2023   Paroxysmal atrial fibrillation (HCC) 06/19/2023   Hypercoagulable state due to  paroxysmal atrial fibrillation (HCC) 06/19/2023   Chronic respiratory failure with hypoxia (HCC) 07/10/2022   Shortness of breath 07/04/2022   Chronic midline low back pain without sciatica 01/30/2019   Age-related osteoporosis with current pathol fracture of vertebra (HCC) 10/28/2018   Calcification of aorta 10/02/2018   Compression fracture of T12 vertebra (HCC) 10/01/2018   Primary osteoarthritis of right knee 07/20/2017   Varicose veins of left lower extremity with complications 07/31/2016   OSA (obstructive sleep apnea) 09/14/2015   Chronic diastolic heart failure (HCC) 09/10/2015   Dysphagia, pharyngoesophageal phase 08/04/2015   Anorexia 08/04/2015   Malaise and fatigue 08/04/2015   Depression, recurrent 04/08/2015   Vitamin D  deficiency    Anxiety    GERD (gastroesophageal reflux disease)    Arthritis    Hypertension associated with type 2 diabetes mellitus (HCC) 05/15/2013   Hyperlipidemia associated with type 2 diabetes mellitus (HCC) 05/15/2013   Type 2 diabetes mellitus with hypercholesterolemia (HCC) 05/15/2013   Morbid obesity (HCC) 05/15/2013   Hypothyroidism 05/15/2013    REFERRING PROVIDER: Fonda Dettinger MD  REFERRING DIAG: Bilateral primary OA of knees.  THERAPY DIAG:  Chronic pain of left knee  Chronic pain of right knee  Unsteadiness on feet  Rationale for Evaluation and Treatment: Rehabilitation  ONSET DATE: Ongoing.    SUBJECTIVE:   SUBJECTIVE STATEMENT: Pt  denies any pain today, but does reports feeling terrible.  PERTINENT HISTORY: Please see above. PAIN:  Are you having pain? No  PRECAUTIONS: Other: No recents falls but please supervise patient's gait.    RED FLAGS: None   WEIGHT BEARING RESTRICTIONS: No  FALLS:  Has patient fallen in last 6 months? No  LIVING ENVIRONMENT: Lives with: lives with their family Lives in: House/apartment Stairs: Performs in a non-reciprocating fashion.   Has following equipment at home: Single  point cane.  Recommended use of her Rolator.    OCCUPATION: Retired.    PLOF: Independent  PATIENT GOALS: Improve walking.    OBJECTIVE:   PATIENT SURVEYS: LEFS:  34/80.  POSTURE: Bilateral genu varum.  PALPATION: General diffuse c/o knee pain.    LOWER EXTREMITY ROM: WFL.    LOWER EXTREMITY MMT:  Bilateral hip flexion 4+/5, abduction is normal.  Bilateral knee extension is normal.    FUNCTIONAL TESTS:  5 times sit to stand: 17 seconds. Timed up and go (TUG): 33 seconds. (-) Romberg test bu supervision required.  GAIT: Slow and cautious gait pattern with a straight cane with a decrease in step and stride length (and portable 02).                                                                                                                                 TREATMENT DATE:    09/18/24                         EXERCISE LOG  Exercise Repetitions and Resistance Comments  Nustep Lvl 3 x 15 mins   Cybex Knee Flexion 40# x 6 mins   Cybex Knee Extension 10# x 6 mins   Seated Marches 2# x 2.5 mins x 2    Seated Hip Adduction    Seated Hip Abduction Red x 3 mins   Seated Ham Curls Red x 20 reps bil    Blank cell = exercise not performed today   Nustep level 1 x 8 minutes with 02 sat post-exercise and HR 63 bpm.      PATIENT EDUCATION:  Education details:  Person educated:  International aid/development worker:  Education comprehension:   HOME EXERCISE PROGRAM:   ASSESSMENT:  CLINICAL IMPRESSION: Pt arrives for today's treatment session denying any pain, but does reports feeling terrible. Pt able to complete entire treatment session with SpO2 remaining great that 93% on baseline 5L of O2.  Pt able to tolerate increased time with all exercises performed today.  Pt reported increased fatigue at completion of today's treatment session.   OBJECTIVE IMPAIRMENTS: Abnormal gait, decreased activity tolerance, decreased balance, and pain.   ACTIVITY LIMITATIONS: carrying, lifting, bending,  standing, and locomotion level  PARTICIPATION LIMITATIONS: meal prep, cleaning, laundry, shopping, and community activity  PERSONAL FACTORS: Time since onset of injury/illness/exacerbation and 1-2 comorbidities: OP, CHF are also affecting patient's functional outcome.   REHAB  POTENTIAL: Fair plus/Good-  CLINICAL DECISION MAKING: Evolving/moderate complexity  EVALUATION COMPLEXITY: Moderate   GOALS:  SHORT TERM GOALS: Target date: 09/01/25  Ind with an initial HEP. Goal status: MET   LONG TERM GOALS: Target date: 09/29/24  Improve 5 time sit to stand by 3 seconds.  Goal status: INITIAL  2.  Improve TUG by at least 5 seconds.  Goal status: INITIAL  3.  Performed Nustep at level for 15 minutes without having to rest and 02 not dropping below 91%.  Goal status: INITIAL   PLAN:  PT FREQUENCY: 2x/week  PT DURATION: 6 weeks  PLANNED INTERVENTIONS: 97110-Therapeutic exercises, 97530- Therapeutic activity, [id number]- Neuromuscular re-education, 97535- Self Care, [id number]- Manual therapy, [id number]- Electrical stimulation (unattended), 97035- Ultrasound, Patient/Family education, Balance training, Stair training, Cryotherapy, and Moist heat  PLAN FOR NEXT SESSION: Nustep, gait and balance activities.    Delon DELENA Gosling, PTA 09/18/2024, 3:02 PM

## 2024-09-22 ENCOUNTER — Telehealth: Payer: Self-pay | Admitting: Adult Health

## 2024-09-22 NOTE — Telephone Encounter (Signed)
 Patient called to schedule 1 year follow up. Next available was 01/01/25 at 11:30 am

## 2024-09-25 ENCOUNTER — Encounter

## 2024-10-06 ENCOUNTER — Telehealth: Payer: Self-pay | Admitting: Nurse Practitioner

## 2024-10-06 ENCOUNTER — Ambulatory Visit

## 2024-10-06 ENCOUNTER — Encounter: Payer: Self-pay | Admitting: Nurse Practitioner

## 2024-10-06 ENCOUNTER — Ambulatory Visit: Attending: Nurse Practitioner | Admitting: Nurse Practitioner

## 2024-10-06 ENCOUNTER — Other Ambulatory Visit: Payer: Self-pay | Admitting: Nurse Practitioner

## 2024-10-06 VITALS — BP 142/74 | HR 61 | Ht 62.0 in | Wt 188.8 lb

## 2024-10-06 DIAGNOSIS — J9611 Chronic respiratory failure with hypoxia: Secondary | ICD-10-CM | POA: Diagnosis not present

## 2024-10-06 DIAGNOSIS — I10 Essential (primary) hypertension: Secondary | ICD-10-CM | POA: Diagnosis not present

## 2024-10-06 DIAGNOSIS — E785 Hyperlipidemia, unspecified: Secondary | ICD-10-CM

## 2024-10-06 DIAGNOSIS — I5032 Chronic diastolic (congestive) heart failure: Secondary | ICD-10-CM | POA: Diagnosis not present

## 2024-10-06 DIAGNOSIS — I350 Nonrheumatic aortic (valve) stenosis: Secondary | ICD-10-CM

## 2024-10-06 DIAGNOSIS — R5383 Other fatigue: Secondary | ICD-10-CM | POA: Diagnosis not present

## 2024-10-06 DIAGNOSIS — I4891 Unspecified atrial fibrillation: Secondary | ICD-10-CM | POA: Diagnosis not present

## 2024-10-06 DIAGNOSIS — R001 Bradycardia, unspecified: Secondary | ICD-10-CM

## 2024-10-06 DIAGNOSIS — G4733 Obstructive sleep apnea (adult) (pediatric): Secondary | ICD-10-CM | POA: Diagnosis not present

## 2024-10-06 MED ORDER — FUROSEMIDE 40 MG PO TABS: 40.0000 mg | ORAL_TABLET | Freq: Every day | ORAL | 0 refills | Status: AC

## 2024-10-06 NOTE — Patient Instructions (Addendum)
 Medication Instructions:  Your physician has recommended you make the following change in your medication:  Please start Lasix  40 Mg daily   Labwork: None   Testing/Procedures: Your physician has recommended that you wear a Zio monitor.   This monitor is a medical device that records the heart's electrical activity. Doctors most often use these monitors to diagnose arrhythmias. Arrhythmias are problems with the speed or rhythm of the heartbeat. The monitor is a small device applied to your chest. You can wear one while you do your normal daily activities. While wearing this monitor if you have any symptoms to push the button and record what you felt. Once you have worn this monitor for the period of time provider prescribed (for 7 days), you will return the monitor device in the postage paid box. Once it is returned they will download the data collected and provide us  with a report which the provider will then review and we will call you with those results. Important tips:  Avoid showering during the first 24 hours of wearing the monitor. Avoid excessive sweating to help maximize wear time. Do not submerge the device, no hot tubs, and no swimming pools. Keep any lotions or oils away from the patch. After 24 hours you may shower with the patch on. Take brief showers with your back facing the shower head.  Do not remove patch once it has been placed because that will interrupt data and decrease adhesive wear time. Push the button when you have any symptoms and write down what you were feeling. Once you have completed wearing your monitor, remove and place into box which has postage paid and place in your outgoing mailbox.  If for some reason you have misplaced your box then call our office and we can provide another box and/or mail it off for you.  Follow-Up: Your physician recommends that you schedule a follow-up appointment in: 6-8 weeks   Any Other Special Instructions Will Be Listed Below  (If Applicable).  If you need a refill on your cardiac medications before your next appointment, please call your pharmacy.

## 2024-10-06 NOTE — Progress Notes (Signed)
 Cardiology Office Note:  .   Date: 10/06/2024 ID:  Danielle Parrish, DOB November 25, 1941, MRN [id number] PCP: Dettinger, Fonda LABOR, MD  [facility] Providers Cardiologist:  Diannah SHAUNNA Maywood, MD    History of Present Illness: .   Danielle Parrish is a 83 y.o. female with a PMH of PAF, hypertension, hyperlipidemia, type 2 diabetes, chronic hypoxic respiratory failure on oxygen , OSA on CPAP, who presents today for scheduled follow-up appointment.  Newly diagnosed with A-fib in June 2024 while in the pulmonology office.  She was sent to the ED, spontaneously converted to normal sinus rhythm.  Last seen by Dr. Maywood on September 07, 2023.  She was overall doing well at the time, but pt did note symptomatic DOE.  2-week event monitor was arranged to rule out any intermittent episodes of A-fib.  Lasix  was increased to 40 mg daily.  01/15/2024 - Today she presents for 3 month follow-up. She states she never received the heart monitor after last office visit/wore a heart monitor. She notes recent stress over the past few months. Daughter passed away recently and holiday season was difficult she states, denies any red flag signs/symptoms. Says overall she is doing fairly well. States when she takes off her O2 temporarily, she notes her SpO2 saturation and HR dropping but then both return to normal after putting her O2 back on. Denies any chest pain, palpitations, syncope, presyncope, dizziness, orthopnea, PND, swelling or significant weight changes, acute bleeding, or claudication. Denies any recent worsening SHOB. Tells she she has recently joined an exercise class and enjoying this very much, also will be starting PT soon.  02/18/2024 - Presents today for follow-up. Admits to fatigue, unsure what this is due to.  She confirms compliance with CPAP. Denies any chest pain, worsening shortness of breath, palpitations, syncope, presyncope, dizziness, orthopnea, PND, swelling or significant weight  changes, acute bleeding, or claudication.  ED visit on 02/28/2024 at [facility] ED for fatigue. Labwork overall unremarkable. See most recent labs noted below.   04/24/2024 - Today she presents for follow-up. She states she still is noticing fatigue.  Confirms compliance with CPAP. Denies any chest pain, worsening shortness of breath, palpitations, syncope, presyncope, dizziness, orthopnea, PND, swelling or significant weight changes, acute bleeding, or claudication.  According to her report, this fatigue has been intermittent 2 to 3 years.  She particularly says fatigue is better today than it was yesterday.  02/28/2024 labs:  Cardiac Troponin T: 19, 16, 17 CBC: WBC 3.8, normal H/H CMET: unremarkable Mag: 1.5 proBNP: WNL TSH: WNL Respiratory viral panel: Negative  Last seen by Lorette Kapur, PA-C on 08/07/2024.  She noted ongoing worsening fatigue since previous office visit.  Reported home heart rate throughout the day could get as low as 40 bpm.  Toprol -XL was decreased to 12.5 mg daily due to reported low heart rate and fatigue, recommended to consider switching to bisoprolol due to her chronic lung disease.  10/06/2024 -  Here for follow-up. Continues to note stable fatigue and nocturnal bradycardia since last office visit, states she hasn't noticed difference since medication was changed at last office visit. Does tell me she has been able to do more work around the house without getting tired or needing to take a break. Denies any chest pain, shortness of breath, palpitations, syncope, presyncope, dizziness, orthopnea, PND, swelling or significant weight changes, acute bleeding, or claudication. Does notice some weight gain.    ROS: Negative. See HPI.   Studies Reviewed: .  EKG: EKG is not ordered today.  Echo 02/2024:  1. Left ventricular ejection fraction, by estimation, is 55 to 60%. Left  ventricular ejection fraction by 3D volume is 56 %. The left ventricle has  normal  function. Left ventricular endocardial border not optimally defined to evaluate regional wall  motion. Left ventricular diastolic parameters are consistent with Grade I  diastolic dysfunction (impaired relaxation). The average left ventricular  global longitudinal strain is -14.3 %.   2. Right ventricular systolic function is normal. The right ventricular  size is normal. Tricuspid regurgitation signal is inadequate for assessing  PA pressure.   3. The mitral valve is normal in structure. No evidence of mitral valve  regurgitation. No evidence of mitral stenosis.   4. The aortic valve has an indeterminant number of cusps. Aortic valve  regurgitation is not visualized. Mild aortic valve stenosis. Aortic valve  mean gradient measures 8.0 mmHg. Aortic valve Vmax measures 2.02 m/s.   5. The inferior vena cava is normal in size with greater than 50%  respiratory variability, suggesting right atrial pressure of 3 mmHg.   6. Increased flow velocities may be secondary to anemia, thyrotoxicosis,  hyperdynamic or high flow state.   Comparison(s): A prior study was performed on 07/11/2022. Changes from prior study are noted. AI is not visualized.  Cardiac monitor 01/2024:   Patch wear time was for 13 days and 23 hours.   Normal sinus rhythm predominantly ranging from 38 to 203 bpm with an average HR 65 bpm.   44 runs of SVT, fastest interval lasting 11 beats/3.6 seconds and the longest interval lasting 24 beats/12 seconds.   3 runs of NSVT, fastest interval lasting 4 beats and the longest interval lasting 6 beats.   No evidence of high-grade AV block or pauses.   <1% PAC burden and <1% PVC burden.   No patient triggered events noted.  Echo 06/2022:   1. Left ventricular ejection fraction, by estimation, is 60 to 65%. The  left ventricle has normal function. The left ventricle has no regional  wall motion abnormalities. There is mild left ventricular hypertrophy.  Left ventricular diastolic  parameters  are consistent with Grade I diastolic dysfunction (impaired relaxation).  The average left ventricular global longitudinal strain is -19.2 %. The  global longitudinal strain is normal.   2. Right ventricular systolic function is low normal. The right  ventricular size is normal. There is normal pulmonary artery systolic  pressure. The estimated right ventricular systolic pressure is 7.2 mmHg.   3. The mitral valve is degenerative. Trivial mitral valve regurgitation.   4. The aortic valve is tricuspid. There is mild calcification of the  aortic valve. Aortic valve regurgitation is mild. Mild aortic valve  stenosis. Aortic regurgitation PHT measures 1034 msec. Aortic valve area,  by VTI measures 1.86 cm. Aortic valve  mean gradient measures 9.0 mmHg.   5. The inferior vena cava is normal in size with greater than 50%  respiratory variability, suggesting right atrial pressure of 3 mmHg.   Comparison(s): Prior images unable to be directly viewed.  Holter monitor 09/2015:  48-hour Holter monitor reviewed. Sinus rhythm present with heart rate ranging from 36 bpm just briefly up to 90 bpm, average heart rate 56 bpm. Lowest sustained heart rate was in the mid 40s. Occasional PACs and PVCs were noted. Brief bursts of PAT ranging 5-7 beats also noted. There were no significant pauses, no atrial fibrillation. No obvious correlating symptoms described.   Physical Exam:  VS:  BP (!) 142/74 (BP Location: Left Arm)   Pulse 61   Ht 5' 2 (1.575 m)   Wt 188 lb 12.8 oz (85.6 kg)   SpO2 91%   BMI 34.53 kg/m    Wt Readings from Last 3 Encounters:  10/06/24 188 lb 12.8 oz (85.6 kg)  08/15/24 188 lb (85.3 kg)  08/07/24 188 lb 6.4 oz (85.5 kg)    GEN: Obese, 83 y.o. female in no acute distress, wearing chronic O2 NECK: No JVD; No carotid bruits CARDIAC: S1/S2, RRR, Grade 1/6 murmur, no rubs, no gallops RESPIRATORY:  Clear to auscultation without rales, wheezing or rhonchi  ABDOMEN:  Soft, non-tender, non-distended EXTREMITIES:  No edema; No deformity   ASSESSMENT AND PLAN: .    HFpEF Stage C, NYHA class II-III symptoms.  EF from March 2025 was 55 to 60%, grade 1 DD.  Weights overall remained stable over the past several months, however weight documented from September of last year was 175 lbs, and weight today is 188 lbs. she tells me she is not taking Lasix .  I instructed her to start Lasix  40 mg daily and will instruct her PCP to check BMET at next office visit.  Continue Toprol -XL and olmesartan . Low sodium diet, fluid restriction <2L, and daily weights encouraged. Educated to contact our office for weight gain of 2 lbs overnight or 5 lbs in one week.  A-fib, bradycardia Denies any tachycardia or palpitations.  Does notice some nocturnal bradycardia - will arrange 1 week Zio monitor for further evaluation. Continue Eliquis  5 mg BID for stroke prevention, denies any bleeding issues. On appropriate dosage. Continue Toprol  XL.   HTN Blood pressure on recheck is 142/81. Discussed to monitor BP at home at least 2 hours after medications and sitting for 5-10 minutes.  No medication changes at this time. Heart healthy diet and regular cardiovascular exercise encouraged.   HLD LDL 70 06/2024.  This is being managed by PCP.  Continue lovastatin . Heart healthy diet and regular cardiovascular exercise encouraged.    4. Aortic valve stenosis Mild aortic valve stenosis noted on most recent echocardiogram with mean gradient measuring 8.0 mmHg.  This has been stable since 2023.  Denies any concerning signs or symptoms.  Recommend updating echocardiogram in 1 year for monitoring, 2026.  5. Chronic hypoxic respiratory failure on oxygen , OSA on CPAP Denies any worsening symptoms.  Breathing is stable per her report.  She is compliant with CPAP.  Continue to follow-up with PCP and Dr. Darlean.   6. Obesity Weight loss via diet and exercise encouraged. Discussed the impact being overweight  would have on cardiovascular risk.    7. Fatigue Etiology unclear and multifactorial. Her most recent Echo does not explain her symptoms. Arranging repeat monitor for further evaluation. I have discussed ischemic evaluation, and patient declines at this time.  Came to shared medical decision with patient we will continue to monitor at this time.  Continue follow-up with PCP. Care and ED precautions discussed.    Dispo: Follow-up with me/APP in 6-8 weeks or sooner anything changes.  Signed, Almarie Crate, NP

## 2024-10-06 NOTE — Telephone Encounter (Signed)
 Checking percert on the following   ZIO XT- 7 Day Bradycardia

## 2024-10-10 ENCOUNTER — Encounter: Payer: Self-pay | Admitting: Internal Medicine

## 2024-10-10 ENCOUNTER — Ambulatory Visit (INDEPENDENT_AMBULATORY_CARE_PROVIDER_SITE_OTHER): Admitting: Internal Medicine

## 2024-10-10 VITALS — BP 150/70 | HR 61 | Ht 62.0 in | Wt 191.2 lb

## 2024-10-10 DIAGNOSIS — J9611 Chronic respiratory failure with hypoxia: Secondary | ICD-10-CM | POA: Diagnosis not present

## 2024-10-10 DIAGNOSIS — R0609 Other forms of dyspnea: Secondary | ICD-10-CM | POA: Diagnosis not present

## 2024-10-10 DIAGNOSIS — R0602 Shortness of breath: Secondary | ICD-10-CM

## 2024-10-10 NOTE — Progress Notes (Signed)
 Danielle Parrish, female    DOB: 02/08/1941   MRN: [id number]   Brief patient profile:  75 yowf never smoker  referred to pulmonary clinic 07/04/2022 by Dr Buck  for cough p shoulder injury requiring GA and surgery in Aug of 2022.  [employer] still able to do groceries leaning on cart / mb and back flat x 75 ft with walker   History of Present Illness  07/04/2022  Pulmonary/ 1st office eval/Broox Lonigro on Macrodantin  Chief Complaint  Patient presents with   Consult    Pt states she has some SOB and a runny nose x1 year. Pt states she has Oxygen  and CPAP machine but she does not use the CPAP machine.   Dyspnea:  mb and back slowly = MMRC3 = can't walk 100 yards even at a slow pace at a flat grade s stopping due to sob   Cough: voice fatigue  Sleep: cpap/02  SABA use: minimal better    Rec You will need to get the echocardiogram asap - call this office if can't schedule it within  a week  Stop macrodantin   Please schedule a follow up office visit in 3- 4 weeks, sooner if needed  - [place] [facility]  - bring all medications Late add: pt instructed on use of amb 02 = goal is to keep > 90% saturations at all times    03/06/2024 ACUTE ov/[place] office/Earla Charlie re: doe/valvular heart dz maint on 02  Cc: just tired  Dyspnea:  room to room doe /  twice weekly  PT with desats reported on POC  Cough: not a problem  Sleeping: bed is flat/ lots of pillows s resp cc  on cpap per neuro  > tired on awakening  SABA use: none 02: 3lpm in cpap/ 5lp POC during the day  Rec Make sure you check your oxygen  saturation  AT  your highest level of activity (not after you stop)   to be sure it stays over 90%  Keep appt with Dr Lord and cardiology   Late add:  needs 4lpm continuous when walking or doing PT (best fit referral)  >>> sent to [facility]    06/09/2024  f/u ov/[place] office/Daiveon Markman re: doe/valvular heart dz/ maint on 02   Chief Complaint  Patient presents with   Follow-up    Shortness of Breath   Dyspnea:  reports walking up and down steps s desats on 4lpm POC  Cough: none  Sleeping: cpap/ 02 3lpm  s  resp cc  SABA use: not using  02: 3lpm/cpap HS  / 3lpm conc living room and  4lpm POC otherwise but did not bring it today and other portable system(tank) is broken per pt   Rec Make sure you check your oxygen  saturation  AT  your highest level of activity (not after you stop)   to be sure it stays over 90%      10/10/2024  f/u ov/[place] office/Bridget Westbrooks re:  doe/valvular heart dz/ maint on 02  with doe x 3 y  Chief Complaint  Patient presents with   Shortness of Breath    F/u    Dyspnea:  mb and back off of 02 / not titrating  Cough: none  Sleeping: cpap on 3pm    resp cc  SABA use: not helping 02: up to 5lpm but not titrating      No obvious day to day or daytime variability or assoc excess/ purulent sputum or mucus plugs or hemoptysis or  cp or chest tightness, subjective wheeze or overt sinus or hb symptoms.    Also denies any obvious fluctuation of symptoms with weather or environmental changes or other aggravating or alleviating factors except as outlined above   No unusual exposure hx or h/o childhood pna/ asthma or knowledge of premature birth.  Current Allergies, Complete Past Medical History, Past Surgical History, Family History, and Social History were reviewed in [employer] record.  ROS  The following are not active complaints unless bolded Hoarseness, sore throat, dysphagia, dental problems, itching, sneezing,  nasal congestion or discharge of excess mucus or purulent secretions, ear ache,   fever, chills, sweats, unintended wt loss or wt gain, classically pleuritic or exertional cp,  orthopnea pnd or arm/hand swelling  or leg swelling, presyncope, palpitations, abdominal pain, anorexia, nausea, vomiting, diarrhea  or change in bowel habits or change in bladder habits, change in stools or change in urine, dysuria,  hematuria,  rash, arthralgias, visual complaints, headache, numbness, weakness or ataxia or problems with walking/uses cane  or coordination,  change in mood or  memory.        Current Meds  Medication Sig   acetaminophen  (TYLENOL ) 500 MG tablet Take 1 tablet (500 mg total) by mouth every 6 (six) hours as needed.   albuterol  (VENTOLIN  HFA) 108 (90 Base) MCG/ACT inhaler Take 2 puffs eveery 6 hours as needed for wheezing or shortness of breath   amLODipine  (NORVASC ) 10 MG tablet TAKE 1 TABLET BY MOUTH EVERY DAY   apixaban  (ELIQUIS ) 5 MG TABS tablet Take 1 tablet (5 mg total) by mouth 2 (two) times daily.   azelastine  (ASTELIN ) 0.1 % nasal spray Place 1 spray into both nostrils 2 (two) times daily. Use in each nostril as directed   Cholecalciferol (VITAMIN D -3 PO) Take 1 capsule by mouth daily.    diclofenac  Sodium (VOLTAREN ) 1 % GEL APPLY 4 GRAMS TOPICALLY 4 (FOUR) TIMES DAILY.   diphenhydrAMINE (BENADRYL) 25 MG tablet Take 50 mg by mouth at bedtime as needed for allergies.    Ferrous Sulfate  (IRON) 325 (65 Fe) MG TABS Take 1 tablet by mouth 3 (three) times daily.   fluorouracil  (EFUDEX ) 5 % cream APPLY TOPICALLY 2 TIMES DAILY FOR 14 DAYS. 2 TIMES DAILY TO CHEEKS AND JAW LINE FOR 2 WEEKS.   furosemide  (LASIX ) 40 MG tablet Take 1 tablet (40 mg total) by mouth daily.   gabapentin  (NEURONTIN ) 300 MG capsule Take 1 capsule (300 mg total) by mouth 3 (three) times daily.   ketoconazole  (NIZORAL ) 2 % cream APPLY TO AFFECTED AREA EVERYDAY   levothyroxine  (SYNTHROID ) 100 MCG tablet Take 1 tablet (100 mcg total) by mouth daily before breakfast.   lidocaine  (LIDODERM ) 5 % PLACE 1 PATCH ONTO THE SKIN DAILY. REMOVE & DISCARD PATCH WITHIN 12 HOURS OR AS DIRECTED BY MD   linaclotide  (LINZESS ) 145 MCG CAPS capsule Take 1 capsule (145 mcg total) by mouth as needed.   lovastatin  (MEVACOR ) 40 MG tablet Take 1 tablet (40 mg total) by mouth at bedtime.   magnesium  oxide (MAG-OX) 400 (240 Mg) MG tablet Take 1 tablet  (400 mg total) by mouth daily.   metoprolol  succinate (TOPROL -XL) 25 MG 24 hr tablet Take 0.5 tablets (12.5 mg total) by mouth daily.   mirabegron  ER (MYRBETRIQ ) 25 MG TB24 tablet Take 1 tablet (25 mg total) by mouth daily.   Multiple Vitamins-Minerals (PRESERVISION AREDS 2 PO) Take by mouth.   nystatin  (MYCOSTATIN /NYSTOP ) powder APPLY TO AFFECTED AREA 4 TIMES  A DAY   olmesartan  (BENICAR ) 20 MG tablet TAKE 1 TABLET BY MOUTH EVERY DAY   omeprazole  (PRILOSEC) 20 MG capsule Take 1 capsule (20 mg total) by mouth 2 (two) times daily before a meal. (Patient taking differently: Take 20 mg by mouth daily as needed.)   pyridOXINE (VITAMIN B-6) 100 MG tablet Take 100 mg by mouth daily.   sertraline  (ZOLOFT ) 100 MG tablet Take 2 tablets (200 mg total) by mouth daily.   sodium chloride  (OCEAN) 0.65 % SOLN nasal spray Place 1 spray into both nostrils as needed for congestion.              Past Medical History:  Diagnosis Date   Anxiety    Arthritis    CHF (congestive heart failure) (HCC)    Chronic pain of multiple joints    Depression    Essential hypertension    GERD (gastroesophageal reflux disease)    Hyperlipidemia    Hyperlipidemia associated with type 2 diabetes mellitus (HCC)    Hypertension associated with type 2 diabetes mellitus (HCC)    Hypothyroidism    Mild scoliosis 10/19/2021   Osteoporosis    Sleep apnea    CPAP   Type 2 diabetes mellitus with hypercholesterolemia (HCC)    Vitamin D  deficiency        Objective:   Wts  10/10/2024     191  06/09/2024       186   03/06/2024       188  06/11/2023       181  03/09/2023       180   02/06/2023       181   11/14/2022     179   08/22/22 173 lb 12.8 oz (78.8 kg)  08/02/22 172 lb (78 kg)  07/04/22 173 lb 3.2 oz (78.6 kg)    Vital signs reviewed  10/10/2024  - Note at rest 02 sats  97% on 5lpm    General appearance:    mod obese (by BMI ) slt hoarse wf nad  Barely able to get up on exam table due to knees        HEENT :  Oropharynx  clear         NECK :  without  apparent JVD/ palpable Nodes/TM    LUNGS: no acc muscle use,  Kyphotic contour chest which is clear to A and P bilaterally without cough on insp or exp maneuvers   CV:  RRR  no s3  2/6 SEM s increase in P2, and no edema   ABD:  soft and nontender   MS:  Gait awkward with cane/   ext warm without deformities Or obvious joint restrictions  calf tenderness, cyanosis or clubbing    SKIN: warm and dry without lesions    NEURO:  alert, approp, nl sensorium with  no motor or cerebellar deficits apparent.       Assessment     Assessment & Plan Shortness of breath Onset Aug 2022  p GA for L shoulder surgery while on chronic macrodantin  - macrodantin  d/c 07/04/2022  - Echo 07/11/2022  1 diastolic dysfunction  Mild AS / AI with nl LA - 08/22/2022 try off acei   - S/p macrodantin  rx 01/01/23 > worse sats 02/06/2023  so d/c again and rec list as allergy  - 06/11/2023 exac x ? 2 weeks esp x 24 h  > RAF new > to ER  - Chest CTa    02/28/24  SSA bases No PE  or other acute finding    - Echo 03/11/24 only mild AS nl LA   Main issues appear to be more limited by arthritis/ conditioning more than doe/hypoxemia at this point and f/u can be prn  Chronic respiratory failure with hypoxia (HCC) Desat  on ambulation 07/04/2022  > d/c'd macrodantin   - 07/04/2022   Walked on RA  x  2  lap(s) =  approx 500  ft  @ slow pace, stopped due to desats/sob with lowest 02 sats 80% > rec use portable 02 to maintain > 90% (already had it at time of ov but didn't bring it with her)    - 08/22/2022   Walked on RA  x  2  lap(s) =  approx 300  ft  @ slow/cane pace, stopped due to knee pain> sob  with lowest 02 sats 88%   - 11/14/2022 no longer desats walking as of  11/14/2022  - 02/06/2023 room air 150 ft walking > SOB with sats down to 84% - placed patient on 2LO2 cont and sats increased to 92% walking  - sats on RA 03/09/2023 on arrival > 03/09/2023   Walked on 2lpm  x  1  lap(s) =   approx 150  ft  @ slow pace, stopped due to sob  with lowest 02 sats 92%   - CTa  02/28/24 SSA, no emboli or ILD  - 03/06/2024   Walked on 4lpm continuous   150 ft very slow pace lowest sats 94%  - 10/10/2024   Walked on 2lpm POC   x  1  lap(s) =  approx 150  ft  @ slow/ awkward cane  pace, stopped due to tired > sob  with lowest 02 sats 89%   Again advised to titrate 02 to sats > 90% so needs more with sustained walking and less when sitting  Pulmonary f/u can be prn for 02 issues   Each maintenance medication was reviewed in detail including emphasizing most importantly the difference between maintenance and prns and under what circumstances the prns are to be triggered using an action plan format where appropriate.  Total time for H and P, chart review, counseling, reviewing 02/ pulse ox  device(s) , directly observing portions of ambulatory 02 saturation study/ and generating customized AVS unique to this office visit / same day charting = 31 min                  AVS  Patient Instructions  Make sure you check your oxygen  saturation  AT  your highest level of activity (not after you stop)   to be sure it stays over 90% and adjust  02 flow upward to maintain this level if needed but remember to turn it back to previous settings when you stop (to conserve your supply).   Pulmonary follow is as needed if problems with your oxygen  levels    Ozell America, MD 10/10/2024

## 2024-10-10 NOTE — Assessment & Plan Note (Addendum)
 Desat  on ambulation 07/04/2022  > d/c'd macrodantin   - 07/04/2022   Walked on RA  x  2  lap(s) =  approx 500  ft  @ slow pace, stopped due to desats/sob with lowest 02 sats 80% > rec use portable 02 to maintain > 90% (already had it at time of ov but didn't bring it with her)    - 08/22/2022   Walked on RA  x  2  lap(s) =  approx 300  ft  @ slow/cane pace, stopped due to knee pain> sob  with lowest 02 sats 88%   - 11/14/2022 no longer desats walking as of  11/14/2022  - 02/06/2023 room air 150 ft walking > SOB with sats down to 84% - placed patient on 2LO2 cont and sats increased to 92% walking  - sats on RA 03/09/2023 on arrival > 03/09/2023   Walked on 2lpm  x  1  lap(s) =  approx 150  ft  @ slow pace, stopped due to sob  with lowest 02 sats 92%   - CTa  02/28/24 SSA, no emboli or ILD  - 03/06/2024   Walked on 4lpm continuous   150 ft very slow pace lowest sats 94%  - 10/10/2024   Walked on 2lpm POC   x  1  lap(s) =  approx 150  ft  @ slow/ awkward cane  pace, stopped due to tired > sob  with lowest 02 sats 89%   Again advised to titrate 02 to sats > 90% so needs more with sustained walking and less when sitting  Pulmonary f/u can be prn for 02 issues   Each maintenance medication was reviewed in detail including emphasizing most importantly the difference between maintenance and prns and under what circumstances the prns are to be triggered using an action plan format where appropriate.  Total time for H and P, chart review, counseling, reviewing 02/ pulse ox  device(s) , directly observing portions of ambulatory 02 saturation study/ and generating customized AVS unique to this office visit / same day charting = 31 min

## 2024-10-10 NOTE — Assessment & Plan Note (Addendum)
 Onset Aug 2022  p GA for L shoulder surgery while on chronic macrodantin  - macrodantin  d/c 07/04/2022  - Echo 07/11/2022  1 diastolic dysfunction  Mild AS / AI with nl LA - 08/22/2022 try off acei   - S/p macrodantin  rx 01/01/23 > worse sats 02/06/2023  so d/c again and rec list as allergy  - 06/11/2023 exac x ? 2 weeks esp x 24 h  > RAF new > to ER  - Chest CTa    02/28/24  SSA bases No PE  or other acute finding    - Echo 03/11/24 only mild AS nl LA   Main issues appear to be more limited by arthritis/ conditioning more than doe/hypoxemia at this point and f/u can be prn

## 2024-10-10 NOTE — Patient Instructions (Addendum)
 Make sure you check your oxygen  saturation  AT  your highest level of activity (not after you stop)   to be sure it stays over 90% and adjust  02 flow upward to maintain this level if needed but remember to turn it back to previous settings when you stop (to conserve your supply).   Pulmonary follow is as needed if problems with your oxygen  levels

## 2024-10-13 ENCOUNTER — Encounter: Payer: Self-pay | Admitting: *Deleted

## 2024-10-16 ENCOUNTER — Ambulatory Visit

## 2024-10-20 DIAGNOSIS — M25562 Pain in left knee: Secondary | ICD-10-CM | POA: Diagnosis not present

## 2024-10-20 DIAGNOSIS — M25561 Pain in right knee: Secondary | ICD-10-CM | POA: Diagnosis not present

## 2024-10-21 DIAGNOSIS — R001 Bradycardia, unspecified: Secondary | ICD-10-CM | POA: Diagnosis not present

## 2024-10-23 ENCOUNTER — Ambulatory Visit: Admitting: Family Medicine

## 2024-10-24 ENCOUNTER — Ambulatory Visit (INDEPENDENT_AMBULATORY_CARE_PROVIDER_SITE_OTHER): Admitting: Family Medicine

## 2024-10-24 ENCOUNTER — Encounter: Payer: Self-pay | Admitting: Family Medicine

## 2024-10-24 VITALS — BP 165/83 | HR 67 | Ht 62.0 in | Wt 185.0 lb

## 2024-10-24 DIAGNOSIS — E1159 Type 2 diabetes mellitus with other circulatory complications: Secondary | ICD-10-CM | POA: Diagnosis not present

## 2024-10-24 DIAGNOSIS — E039 Hypothyroidism, unspecified: Secondary | ICD-10-CM

## 2024-10-24 DIAGNOSIS — I152 Hypertension secondary to endocrine disorders: Secondary | ICD-10-CM | POA: Diagnosis not present

## 2024-10-24 DIAGNOSIS — I5032 Chronic diastolic (congestive) heart failure: Secondary | ICD-10-CM

## 2024-10-24 DIAGNOSIS — E1169 Type 2 diabetes mellitus with other specified complication: Secondary | ICD-10-CM | POA: Diagnosis not present

## 2024-10-24 DIAGNOSIS — R399 Unspecified symptoms and signs involving the genitourinary system: Secondary | ICD-10-CM

## 2024-10-24 DIAGNOSIS — E7800 Pure hypercholesterolemia, unspecified: Secondary | ICD-10-CM | POA: Diagnosis not present

## 2024-10-24 DIAGNOSIS — L209 Atopic dermatitis, unspecified: Secondary | ICD-10-CM

## 2024-10-24 DIAGNOSIS — E785 Hyperlipidemia, unspecified: Secondary | ICD-10-CM

## 2024-10-24 LAB — MICROSCOPIC EXAMINATION
RBC, Urine: NONE SEEN /HPF (ref 0–2)
Renal Epithel, UA: NONE SEEN /HPF
Yeast, UA: NONE SEEN

## 2024-10-24 LAB — URINALYSIS, COMPLETE
Bilirubin, UA: NEGATIVE
Glucose, UA: NEGATIVE
Ketones, UA: NEGATIVE
Nitrite, UA: NEGATIVE
Protein,UA: NEGATIVE
RBC, UA: NEGATIVE
Specific Gravity, UA: 1.01 (ref 1.005–1.030)
Urobilinogen, Ur: 0.2 mg/dL (ref 0.2–1.0)
pH, UA: 6.5 (ref 5.0–7.5)

## 2024-10-24 LAB — BAYER DCA HB A1C WAIVED: HB A1C (BAYER DCA - WAIVED): 5.4 % (ref 4.8–5.6)

## 2024-10-24 NOTE — Progress Notes (Signed)
 BP (!) 165/83   Pulse 67   Ht 5' 2 (1.575 m)   Wt 185 lb (83.9 kg)   SpO2 95%   BMI 33.84 kg/m    Subjective:   Patient ID: Ricka Susie Bar, female    DOB: 04/19/1941, 83 y.o.   MRN: [id number]  HPI: Kelina Beauchamp is a 83 y.o. female presenting on 10/24/2024 for Medical Management of Chronic Issues, Hyperlipidemia, Hypertension, Hypothyroidism, Diabetes, Depression, and dark urine   Discussed the use of AI scribe software for clinical note transcription with the patient, who gave verbal consent to proceed.  History of Present Illness   Laykin Rainone Haren Ree-ta is an 83 year old female who presents with double vision and back pain.  Diplopia - Double vision present for three weeks - No improvement with new glasses - Continues to see 'two of everything'  Back pain and pruritus - Back pain worsens with bowel movements - No diarrhea - Scratches on back that bleed, attributed to dry skin - Treating back scratches with alcohol  Fatigue - Low energy levels  Cardiopulmonary status - Blood pressure recently 132/76 mmHg - Blood sugar levels stable, A1c 5.4 - On 2 liters of oxygen  - No longer using inhalers due to lack of efficacy - Under care of cardiologist and satisfied with management - Lung specialist discontinued care; considering new pulmonologist  Thyroid  management - On levothyroxine  100 mcg daily  Iron supplementation - Continues iron supplements without significant side effects          Relevant past medical, surgical, family and social history reviewed and updated as indicated. Interim medical history since our last visit reviewed. Allergies and medications reviewed and updated.  Review of Systems  Constitutional:  Negative for chills and fever.  HENT:  Negative for congestion, ear discharge and ear pain.   Eyes:  Negative for redness and visual disturbance.  Respiratory:  Negative for chest tightness and shortness of breath.    Cardiovascular:  Negative for chest pain and leg swelling.  Genitourinary:  Negative for difficulty urinating and dysuria.  Musculoskeletal:  Negative for back pain and gait problem.  Skin:  Positive for rash. Negative for color change.  Neurological:  Negative for dizziness, light-headedness and headaches.  Psychiatric/Behavioral:  Negative for agitation and behavioral problems.   All other systems reviewed and are negative.   Per HPI unless specifically indicated above   Allergies as of 10/24/2024       Reactions   Nitrofurantoin  Shortness Of Breath   Likely macrodantin  pulmonary toxicity 06/2022    Nsaids    Burns stomach.    Penicillins Hives, Other (See Comments)   Has patient had a PCN reaction causing immediate rash, facial/tongue/throat swelling, SOB or lightheadedness with hypotension: Yes Has patient had a PCN reaction causing severe rash involving mucus membranes or skin necrosis: unknown Has patient had a PCN reaction that required hospitalization No Has patient had a PCN reaction occurring within the last 10 years: No If all of the above answers are NO, then may proceed with Cephalosporin use.   Rosuvastatin Other (See Comments)   Body aches.   Statins Other (See Comments)   Vytorin [ezetimibe-simvastatin]    Body aches.    Aspirin Nausea Only   Tolmetin Nausea Only   Burns stomach.         Medication List        Accurate as of October 24, 2024 11:59 AM. If you have any questions, ask your nurse or doctor.  acetaminophen  500 MG tablet Commonly known as: TYLENOL  Take 1 tablet (500 mg total) by mouth every 6 (six) hours as needed.   albuterol  108 (90 Base) MCG/ACT inhaler Commonly known as: VENTOLIN  HFA Take 2 puffs eveery 6 hours as needed for wheezing or shortness of breath   amLODipine  10 MG tablet Commonly known as: NORVASC  TAKE 1 TABLET BY MOUTH EVERY DAY   apixaban  5 MG Tabs tablet Commonly known as: Eliquis  Take 1 tablet (5 mg  total) by mouth 2 (two) times daily.   azelastine  0.1 % nasal spray Commonly known as: ASTELIN  Place 1 spray into both nostrils 2 (two) times daily. Use in each nostril as directed   b complex vitamins capsule Take 1 capsule by mouth daily.   Biotin 10 MG Caps Take 10 mg by mouth daily.   diclofenac  Sodium 1 % Gel Commonly known as: VOLTAREN  APPLY 4 GRAMS TOPICALLY 4 (FOUR) TIMES DAILY.   diphenhydrAMINE 25 MG tablet Commonly known as: BENADRYL Take 50 mg by mouth at bedtime as needed for allergies.   Fish Oil 1200 MG Caps Take 1 capsule by mouth daily.   fluorouracil  5 % cream Commonly known as: EFUDEX  APPLY TOPICALLY 2 TIMES DAILY FOR 14 DAYS. 2 TIMES DAILY TO CHEEKS AND JAW LINE FOR 2 WEEKS.   furosemide  40 MG tablet Commonly known as: LASIX  Take 1 tablet (40 mg total) by mouth daily.   gabapentin  300 MG capsule Commonly known as: NEURONTIN  Take 1 capsule (300 mg total) by mouth 3 (three) times daily.   Iron 325 (65 Fe) MG Tabs Take 1 tablet by mouth 3 (three) times daily.   ketoconazole  2 % cream Commonly known as: NIZORAL  APPLY TO AFFECTED AREA EVERYDAY   levothyroxine  100 MCG tablet Commonly known as: SYNTHROID  Take 1 tablet (100 mcg total) by mouth daily before breakfast.   lidocaine  5 % Commonly known as: LIDODERM  PLACE 1 PATCH ONTO THE SKIN DAILY. REMOVE & DISCARD PATCH WITHIN 12 HOURS OR AS DIRECTED BY MD   linaclotide  145 MCG Caps capsule Commonly known as: LINZESS  Take 1 capsule (145 mcg total) by mouth as needed.   lovastatin  40 MG tablet Commonly known as: MEVACOR  Take 1 tablet (40 mg total) by mouth at bedtime.   magnesium  oxide 400 (240 Mg) MG tablet Commonly known as: MAG-OX Take 1 tablet (400 mg total) by mouth daily.   metoprolol  succinate 25 MG 24 hr tablet Commonly known as: TOPROL -XL Take 0.5 tablets (12.5 mg total) by mouth daily.   mirabegron  ER 25 MG Tb24 tablet Commonly known as: Myrbetriq  Take 1 tablet (25 mg total) by  mouth daily.   multivitamin with minerals Tabs tablet Take 1 tablet by mouth daily.   nystatin  powder Commonly known as: MYCOSTATIN /NYSTOP  APPLY TO AFFECTED AREA 4 TIMES A DAY   olmesartan  20 MG tablet Commonly known as: BENICAR  TAKE 1 TABLET BY MOUTH EVERY DAY   omeprazole  20 MG capsule Commonly known as: PRILOSEC Take 1 capsule (20 mg total) by mouth 2 (two) times daily before a meal. What changed:  when to take this reasons to take this   PRESERVISION AREDS 2 PO Take by mouth.   pyridOXINE 100 MG tablet Commonly known as: VITAMIN B6 Take 100 mg by mouth daily.   sertraline  100 MG tablet Commonly known as: ZOLOFT  Take 2 tablets (200 mg total) by mouth daily.   sodium chloride  0.65 % Soln nasal spray Commonly known as: OCEAN Place 1 spray into both nostrils as needed  for congestion.   VITAMIN D -3 PO Take 1 capsule by mouth daily.         Objective:   BP (!) 165/83   Pulse 67   Ht 5' 2 (1.575 m)   Wt 185 lb (83.9 kg)   SpO2 95%   BMI 33.84 kg/m   Wt Readings from Last 3 Encounters:  10/24/24 185 lb (83.9 kg)  10/10/24 191 lb 3.2 oz (86.7 kg)  10/06/24 188 lb 12.8 oz (85.6 kg)    Physical Exam Physical Exam   VITALS: BP- 132/76 CHEST: Lungs clear to auscultation. CARDIOVASCULAR: Heart sounds normal, regular rhythm. EXTREMITIES: Good pulses, no edema. SKIN: No cuts or sores on feet. Dry skin present.  Dry scaly rash with some papules on her back and upper buttock        Assessment & Plan:   Problem List Items Addressed This Visit       Cardiovascular and Mediastinum   Hypertension associated with type 2 diabetes mellitus (HCC) (Chronic)   Chronic diastolic heart failure (HCC) (Chronic)     Endocrine   Hyperlipidemia associated with type 2 diabetes mellitus (HCC) (Chronic)   Type 2 diabetes mellitus with hypercholesterolemia (HCC)   Relevant Orders   Bayer DCA Hb A1c Waived   Hypothyroidism - Primary   Relevant Orders   TSH   Other  Visit Diagnoses       UTI symptoms       Relevant Orders   Urinalysis, Complete   Urine Culture     Atopic dermatitis, unspecified type              Chronic respiratory failure Managed with home oxygen  at 2 L/min. Lung specialist discharged her despite oxygen  dependency. - Continue home oxygen  at 2 L/min. - Seek a new pulmonologist for ongoing management.  Essential hypertension Blood pressure elevated during visit, stable at home. - Monitor blood pressure regularly at home.  Type 2 diabetes mellitus Well-controlled with A1c of 5.4%.  Hypothyroidism Managed with levothyroxine  100 mcg daily, doing well.  Iron deficiency anemia Managed with iron supplementation, no significant side effects.  Pruritus and xerosis of skin (back) Exacerbated by alcohol use on skin, reports dry skin with scratches and bleeding. - Discontinue use of alcohol on the skin. - Apply moisturizer such as Cerave, Cetaphil, or Eucerin twice daily.          Follow up plan: Return in about 3 months (around 01/24/2025), or if symptoms worsen or fail to improve, for Diabetes and hypertension and thyroid  recheck.  Counseling provided for all of the vaccine components Orders Placed This Encounter  Procedures   Urine Culture   Bayer DCA Hb A1c Waived   Urinalysis, Complete   TSH    Fonda Levins, MD [facility] 10/24/2024, 11:59 AM

## 2024-10-25 LAB — TSH: TSH: 2.42 u[IU]/mL (ref 0.450–4.500)

## 2024-10-26 LAB — URINE CULTURE

## 2024-10-27 ENCOUNTER — Ambulatory Visit: Payer: Self-pay | Admitting: Family Medicine

## 2024-10-28 ENCOUNTER — Telehealth: Payer: Self-pay | Admitting: Adult Health

## 2024-10-28 NOTE — Telephone Encounter (Signed)
 Patient said CPAP machine giving a message  machine has exceed it's life. Would like a call back to know what to do.

## 2024-10-28 NOTE — Telephone Encounter (Signed)
 Pt setup date shows 2017 so she would be due new machine. However she is due now for a yearly follow-up so insurance will not cover anything new until she is seen again.  Please let patient know to continue using her cpap and also offer her a sooner appt with Megan NP. At the next appointment we can discuss process for getting a new machine.

## 2024-11-03 NOTE — Telephone Encounter (Signed)
 thanks

## 2024-11-04 NOTE — Telephone Encounter (Signed)
 Pt set up with current machine 12/31/2015. DME: [facility]. Phone: [phone]. Pt will need updated visit first prior to ordering new CPAP machine. Currently scheduled 01/01/25 at 11:30a with MM,NP. Added pt to wait list. As of right now, no sooner appt available with MD or NP.    I called pt at [phone]. LVM for her to call office.  Phone room: if pt calls back. Please reiterate that she will need updated visit first prior to being able to order new CPAP. I added her to wait list in case sooner appt opens up. She can call [facility] who gave her current CPAP to see if they can fix until she can be seen at our office for a visit. Their phone number is above.

## 2024-11-04 NOTE — Telephone Encounter (Signed)
 Pt called stating that she is in [facility] and requested to speak to nurse about getting Cpap Machines Informed Nurse telephone  note Pt states she would like to speak to Nurse

## 2024-11-06 NOTE — Telephone Encounter (Signed)
 Patient returned phone call. Patient said had a fall at home on 10/24/24 and injured back. Have CPAP at the hospital, but they decided not to use it due to message on machine. Would like a call back at [phone]

## 2024-11-06 NOTE — Telephone Encounter (Signed)
 Called pt at [phone].  Relayed that if her personal CPAP only has message that motor life exceeded its ok to still use. It is a warning that it may stop working in the near future but no reason she cannot still use it unless there are other messages showing than this. Then they may need to discuss further with [facility]. I encouraged pt to speak with hospital staff about using her CPAP and if not able, to try again to use hospital CPAP and see if they can make adjustments to be more comfortable for her. Expressed importance of using CPAP.  She is aware she is on wait list in case something sooner opens up and that appt needed first prior to being able to order new CPAP. She will call back if she has any further questions/concerns.

## 2024-11-07 DIAGNOSIS — R001 Bradycardia, unspecified: Secondary | ICD-10-CM

## 2024-11-18 ENCOUNTER — Ambulatory Visit: Payer: Self-pay | Admitting: Nurse Practitioner

## 2024-11-25 NOTE — Discharge Summary (Signed)
 "  [facility]  Discharge Summary  PCP: Butler JONETTA Der, MD Discharge Details   Admit date:         11/19/2024 Discharge date and time:       12/17/2024 Hospital LOS:    29  days  Active Hospital Problems   Diagnosis Date Noted POA   Morbid obesity (*) 11/06/2024 Yes   Closed T11 fracture (*) 10/31/2024 Yes   Hyponatremia 10/31/2024 Yes   History of peripheral neuropathy 10/31/2024 Not Applicable   PAF (paroxysmal atrial fibrillation) (*) 06/19/2023 Yes   Respiratory failure with hypoxia and hypercapnia, unspecified chronicity (*) 08/07/2021 Yes   Essential hypertension 08/07/2021 Yes   Hyperlipidemia 08/07/2021 Yes   OSA on CPAP 09/14/2015 Yes   Type 2 diabetes mellitus with other specified complication (*) 05/15/2013 Yes   Adult hypothyroidism 05/15/2013 Yes    Resolved Hospital Problems   Diagnosis Date Noted Date Resolved POA   *Ileus (*) 11/19/2024 12/17/2024 Yes   Acute on chronic respiratory failure with hypoxia (*) 10/31/2024 12/17/2024 Yes      Current Discharge Medication List     START taking these medications      Details  benzonatate  100 mg capsule Commonly known as: TESSALON   Take one capsule (100 mg dose) by mouth 3 (three) times a day as needed for Cough for up to 7 days. Quantity: 21 capsule   FT GAS RELIEF 80 MG chewable tablet Generic drug: simethicone  Chew one tablet (80 mg dose) by mouth 4 (four) times a day as needed for Flatulence. Quantity: 28 tablet   methocarbamol 500 mg tablet Commonly known as: ROBAXIN  Take one tablet (500 mg dose) by mouth 3 (three) times a day as needed for up to 5 days. Quantity: 15 tablet   polyethylene glycol 17 g packet Commonly known as: MIRALAX  Start taking on: December 18, 2024  Dissolve 17 grams (1 packet) in liquid and take daily as directed. Quantity: 14 packet   sulfamethoxazole -trimethoprim  800-160 mg per tablet Commonly  known as: BACTRIM  DS  Take one tablet by mouth every 12 (twelve) hours for 5 doses. Indication: Urinary Tract Infection Quantity: 5 tablet   WES-PHOS 250 NEUTRAL 155-852-130 MG Tabs  Take one tablet by mouth 2 (two) times daily. Quantity: 4 tablet       CONTINUE these medications which have CHANGED      Details  HM LIDOCAINE  PATCH 4 % Generic drug: lidocaine  4% patch What changed: Another medication with the same name was removed. Continue taking this medication, and follow the directions you see here.  Place one patch onto the skin daily as needed (pain). Remove & Discard patch within 12 hours or as directed by MD       CONTINUE these medications which have NOT CHANGED      Details  acetaminophen  650 MG CR tablet Commonly known as: TYLENOL  ARTHRITIS,MAPAP  Take two tablets (1,300 mg dose) by mouth 2 (two) times a day as needed for Pain.   amLODIPine  besylate 10 mg tablet Commonly known as: NORVASC   Take one tablet (10 mg dose) by mouth every morning.   azelastine  0.1 % nasal spray Commonly known as: ASTELIN   one spray by Nasal route 2 (two) times a day as needed.   cholecalciferol 1,000 units (25 mcg) tablet Commonly known as: VITAMIN D -3  Take one tablet (1,000 Units dose) by mouth 2 (two) times daily.   clotrimazole-betamethasone  cream Commonly known as: LOTRISONE  Apply one Application topically daily. To abdominal folds and groin   diclofenac  sodium 1 % gel Commonly known as: VOLTAREN   Apply four g topically as needed.   ELIQUIS  5 MG tablet Generic drug: apixaban   Take one tablet (5 mg dose) by mouth 2 (two) times daily.   ferrous sulfate  325 (65 FE) MG tablet  Take one tablet (325 mg dose) by mouth daily.   fish oil 1200 mg capsule  Take one capsule (1,200 mg dose) by mouth daily.   furosemide  40 mg tablet Commonly known as: LASIX   Take one half tablet (20 mg dose) by mouth daily.   gabapentin  300 mg capsule Commonly known as: NEURONTIN   Take  one capsule (300 mg dose) by mouth 3 (three) times a day.   HYDROcodone -acetaminophen  5-325 mg per tablet Commonly known as: NORCO  Take one tablet by mouth every 8 (eight) hours as needed for Pain. Max Daily Amount: 3 tablets   ipratropium-albuterol  0.5-2.5 mg/3 mL ML Soln nebulizer solution Commonly known as: DUONEB  Take 3 mLs by nebulization every 6 (six) hours as needed.   levothyroxine  sodium 100 mcg tablet Commonly known as: SYNTHROID ,LEVOTHROID,LEVOXYL   Take one tablet (100 mcg dose) by mouth daily.   lovastatin  40 mg tablet Commonly known as: MEVACOR   Take one tablet (40 mg dose) by mouth at bedtime.   magnesium  oxide 400 Tabs Commonly known as: MAG-OX  Take one tablet (400 mg dose) by mouth daily.   metoprolol  succinate 25 mg 24 hr tablet Commonly known as: TOPROL -XL  Take one half tablet (12.5 mg dose) by mouth daily.   Multi Vitamin/Minerals Tabs  Take 1 tablet by mouth daily.   naloxone nasal spray (TAKE HOME PACK) Commonly known as: NARCAN  Use 1 spray in nostril for Opioid Reversal. Call 911. Repeat in 3 minutes as needed if no or minimal response. Quantity: 2 each   olmesartan  20 MG tablet Commonly known as: BENICAR   Take one tablet (20 mg dose) by mouth daily.   omeprazole  40 mg capsule Commonly known as: PRILOSEC  Take one capsule (40 mg dose) by mouth 2 (two) times daily before meals. Quantity: 180 capsule   Respiratory Therapy Supplies Misc  by Does not apply route. CPAP   sertraline  100 mg tablet Commonly known as: ZOLOFT   Take one tablet (100 mg dose) by mouth daily. Quantity: 30 tablet   SPIRIVA HANDIHALER 18 mcg Caps inhalation capsule Generic drug: tiotropium Bromide  Place 1 Inhalation into inhaler and inhale daily.   SYSTANE 0.4-0.3 % ophthalmic solution Generic drug: polyethyl glycol-propyl glycol  Place one drop into both eyes 2 (two) times daily.   VITAMIN B-12 PO  Take 1 tablet by mouth daily.      * You might also be  taking other medications not listed above. If you have questions about any of your other medications, talk to the person who prescribed them or your Primary Care Provider.          STOP taking these medications    acetaminophen -codeine 300-30 mg per tablet Commonly known as: TYLENOL  #3   albuterol  sulfate HFA 108 (90 Base) MCG/ACT inhaler Commonly known as: PROVENTIL ,VENTOLIN ,PROAIR    cefdinir  300 mg capsule Commonly known as: OMNICEF    diphenhydrAMINE 25 mg capsule Commonly known as: BANOPHEN,BENADRYL   IRON PO   ketoconazole  2 % cream Commonly known as: NIZORAL    morphine sulfate ER 15 mg 12 hr tablet Commonly known as: MS CONTIN   nystatin  powder Commonly known as: MYCOSTATIN   Hospital Course   Indication for Admission/chief complaint: abd pain  Hospital Course:       83 yo female with history of chronic hypoxic respiratory failure on 2L, HTN, HLD, HFpEF, pAF, hx of chronic L2-L3 compression fractures and recent T11 compression fracture and hospital stay related to this who presented with abd pain.  She was admitted from 11/8-11/17 after several falls and a T11 compression fracture.  She was ultimately discharged to SNF and treated with MS Contin and PRN oxycodone .  She was diagnosed and treated for UTI and PNA at SNF.  Reports not much improvement in her back pain despite opioids and then had worsening abd pain, N/V and abd distention.   She was found to have an ileus and severe hyponatremia.  She was admitted, treated with IVF and supportive care and General Surgery consulted.  Her Na level steadily improved with normal saline and IVF was stopped on 12/1 with Na 134.  She was followed by General Surgery, no surgical intervention.  With fluids, bowel regimen and supportive care her ileus slowly resolved.  She was also treated for a pseudomonas UTI with Cefepime, completed therapy on 12/1.     She was quite weak and continued to have issues with back pain and abd  pain related to her ileus.  She was resistant to some suggestions and initially very hesitant to participate in therapy.  However, as her bowels improved, she did well off opioids and was able to really increase her overall participation and mobility on 12/2.  Due to the complex nature of her T11 compression fracture with epidural hematoma ongoing pain and complications such as ileus, we repeated her MRI, discussed with Neurosurgery and then consulted IR for kyphoplasty.  She underwent kyphoplasty 12/5 and did well.  She was evaluated by PT and HHPT with a 24-hour assistance was recommended.    Pt's hospitalization was prolonged by developing AECOPD with Influenza A, abdominal pain, recurrent UTI. All treated and improved. By discharge on baseline supplemental O2 at 2L [place].  Discharged on 2 more days of Bactrim  for UTI. She continued to work with PT/OT while hospitalized and continued to improve. On dc HH ordered along with DME including a hospital bed.  Liver lesions of unclear etiology- incidental finding On CT abdomen with IV contrast at presentation, scan noted subcentimeter hypoattenuating lesions that are too small to characterize, discussed these with the patient as well as her daughter via the phone, they are agreeable to outpatient follow-up/imaging as likely not currently contributing to her presentation   Remainder of patinet's chronic conditions were monitored and home medications continued with adjustments as needed. On day of discharge, the patient was deemed medically ready for discharge from the hospital.  The hospital course, treatment and discharge plans going forward were discussed with the patient and her grandson Chadwick). Offered to call daughter but pt declined and preferred to update daughter herself; all questions were answered.    Recommendations to physicians/followup needed:  - follow up on liver lesions with PCP, GI- liver lesions    Physical Exam: Vitals:   12/17/24 1644   BP: (!) 156/83  Pulse: 81  Resp: 18  Temp: 98.1 F (36.7 C)  SpO2: 95%   General: NAD, on [place] Head: Atraumatic. Normocephalic. Eyes: Vision grossly intact. Non-icteric, EOMI. Cardiac: RRR,  no LE edema Pulmonary: CTAB Abdominal: Normal bowel sounds. Soft, non-tender to palpation. Non-distended. Obese MSK: ROM intact, mobilizing with a walker Skin: Mild erythema under breast bilaterally Neuro:  AAOx4 Psych: Appropriate Mood and affect  for situation.   Labs on Discharge:  Recent Labs    Units 12/16/24 0234 12/15/24 0221 12/14/24 0122 12/13/24 0606 12/12/24 0415 12/11/24 0118  WBC thou/mcL 4.6 5.0 4.5 2.8* 3.0* 2.9*  HGB gm/dL 88.4 88.8* 88.2 87.8 88.6 10.9*  HCT % 35.6 35.2 36.0 36.9 34.9 33.7*  PLT thou/mcL 202 201 206 183 178 161   Recent Labs    Units 12/16/24 1046 12/16/24 0234 12/15/24 0221 12/14/24 0122 12/13/24 0606 12/12/24 0415 12/11/24 0118  NA mmol/L  --  132* 133* 133* 132* 135* 133*  K mmol/L  --  4.2 4.1 4.0 3.5* 3.8 4.0  CL mmol/L  --  93* 96* 92* 92* 93* 92*  CO2 mmol/L  --  26 26 29 31 29  33*  BUN mg/dL  --  11 12 11 9 12 16   CREATININE mg/dL  --  9.53* 9.59* 9.58* 0.40* 0.40* 0.40*  CALCIUM  mg/dL  --  89.6* 89.5* 89.5* 10.5* 10.5* 10.3*  PHOS mg/dL 2.4*  --   --   --   --   --   --    No results for input(s): BILITOT, AST, ALT, ALKPHOS, PROT, ALBUMIN, LDH, URICACID in the last 168 hours.  No results for input(s): TSH, HGBA1C in the last 168 hours. No results for input(s): LABPROT, INR, PTT in the last 168 hours.  No results for input(s): CHOL, LDL, HDL, TRIG in the last 168 hours. No results for input(s): TROPONIN, CK in the last 168 hours.  Invalid input(s): CK-MB  Diagnostics:  CT Abdomen Pelvis WO IV Contrast  Final Result  Impression:  1.  Increased mild bladder wall thickening and slight surrounding haziness which may reflect developing cystitis. Persistent distention of the bladder.  2.   Additional chronic/ancillary findings as above.          Electronically Signed by: Allison Kobus on 12/13/2024 7:57 AM    XR Chest Ap Portable  Final Result  IMPRESSION: Decreased lung volumes, with increasing bibasilar atelectasis.    Electronically Signed by: Grayce Linear, MD on 12/09/2024 10:58 AM    CT Abdomen Pelvis W IV Contrast  Final Result  IMPRESSION:    1. Mild bilateral hydronephrosis, likely related to marked urinary bladder distention.  2. Recent T11 kyphoplasty. Slightly increased bony retropulsion compared to 11/19/2024 CT, with moderate spinal canal stenosis. Old treated fractures of L2 and L3.  3. Mild anasarca.  4. Mild ileus, improved from 11/19/2024.  5. Panniculitis.  6. Additional chronic and/or stable findings discussed above.    Electronically Signed by: Venetia Smalling, MD on 12/02/2024 8:13 AM    XR Abdomen Portable  Final Result  IMPRESSION:    Moderate colonic stool. Nonobstructed bowel gas pattern.    Electronically Signed by: Reyes Luna, MD on 11/30/2024 4:38 PM    IR Kyphoplasty  Final Result  IMPRESSION:  1. Technically successful percutaneous kyphoplasty of the T11 vertebral body using a bipedicle approach.  2. No evidence of cement leakage.    Electronically Signed by: Sheree Daring MD, PHD on 11/28/2024 2:46 PM    XR Chest Ap Portable  Final Result  IMPRESSION: Mild bibasilar opacities with small effusions.    Electronically Signed by: Grayce Linear, MD on 11/24/2024 12:34 PM    XR Abdomen Portable  Final Result  IMPRESSION:    Similar appearance to partial small bowel obstruction versus ileus.    Electronically Signed by: Reyes Luna, MD on 11/24/2024 10:30 AM  MRI Spine Thoracic WO W Contrast  Final Result  IMPRESSION:  1.  3 column T11 fracture is redemonstrated with mild interval height loss as above.   2.  Partial resorption of the dorsal epidural hematoma at T10-T11.   3.  Discontinuity of the anterior  longitudinal ligament at T11.   4.  Varying degrees of multilevel spondylosis and epidural hematoma as above. Spinal canal stenosis is moderate at T10-T11.  5.  DISH morphology of the thoracic spine   6.  Imaging features are suggestive of osteopenia.   7.  Grade 1 retrolisthesis of T12 on L1.  8.  T10-T11 interspinous bursitis. Injury of the interspinous ligament is not excluded.    Electronically Signed by: Maude Angst on 11/23/2024 1:31 PM    XR Abdomen Portable  Final Result  IMPRESSION: Gaseous distention of small and large bowel loops is decreased compared to 11/20/2024.    Mild streaky left basilar opacity favoring atelectasis.    Electronically Signed by: Suzen Bumpers, MD on 11/22/2024 8:46 AM    XR Abdomen Portable  Final Result  IMPRESSION:  1. Ileus versus partial small bowel obstruction    Electronically Signed by: Glendia Guillaume, MD on 11/20/2024 12:11 PM    US  Venous Lower Extremity Left  Final Result  IMPRESSION:  No evidence of DVT.    Electronically Signed by: Lynwood Portugal on 11/19/2024 10:18 AM    CT Angio Chest Pulmonary  Final Result  IMPRESSION: No evidence of pulmonary embolus. Redemonstrated are small bilateral pleural effusions. Additional comments are as above.        Please note that any pulmonary nodule follow up recommendations are in accordance with the Fleischner Society 2017 guidelines, unless otherwise stated.    Electronically Signed by: Lynwood Portugal on 11/19/2024 9:45 AM    CT Abdomen Pelvis W IV Contrast  Final Result  IMPRESSION: Interval development of diffuse small bowel dilatation most suggestive of ileus. There is a small volume of ascites. Additional comments are as      Please note that any pulmonary nodule follow up recommendations are in accordance with the Fleischner Society 2017 guidelines, unless otherwise stated.    Electronically Signed by: Lynwood Portugal on 11/19/2024 9:43 AM    XR Chest Ap Portable  Final Result   Impression: Bibasilar atelectasis or pneumonia.    Electronically Signed by: Lynwood Portugal on 11/19/2024 7:38 AM        Post Hospital Care   Discharge Procedure Orders  Ambulatory Referral to Home Health  Referral Priority: Routine Referral Type: Home Health Care  Referral Reason: Evaluate and Return  Requested Specialty: Home Health Services  Number of Visits Requested: 1 Expiration Date: 06/05/25   Ambulatory referral to Gastroenterology  Standing Status: Future  Referral Priority: Routine Referral Type: Consultation  Referral Reason: Evaluate and Return  Requested Specialty: Gastroenterology  Number of Visits Requested: 1 Expiration Date: 06/14/25     Potential for Rehab:        Fair Code Status:   Full Code Disposition: Home with home health Consults: general surgery  Followup appointments:  Future Appointments  Date Time Provider Department Center  01/05/2025  1:40 PM Mallory Rich Shatlock, ACNP [facility] None     Time spent in discharge process:  total time spent 60 minutes   Electronically signed: Gladies GORMAN Crea, DO 12/17/2024 / 6:29 PM   *Some images could not be shown."

## 2024-11-26 NOTE — Telephone Encounter (Signed)
 Pt called stating that she called [facility]  and they stated they have n or received anything , I Informed Pt of  Nurse note  and informer her that  she spoke with Nurse . Nurse stated that Pt has to bee seen first to discuss New CPAP. Pt is in hospital and is not sure when she will be able to leave . Pt is very frustrated. I tried to inform Pt that  once she is release  since Pt stated that she may be discharged at the end of December . In formed  Pt That her appt will follow once discharged . So she will be able to get New Cpap . Pt is very frustrated  and could not breath on Phone . Informed Pt I will message the nurse .

## 2024-11-27 NOTE — Telephone Encounter (Signed)
 Called [facility] at [phone]. Spoke w/ Skippy. Relayed since pt has not been seen in over a yr, she will need updated visit first before we provide an updated order for new machine. She verbalized understanding. They are aware and have spoken with pt as well.   Called pt back at [phone]. Phone continued to ring, unable to LVM.

## 2024-11-28 ENCOUNTER — Ambulatory Visit: Attending: Nurse Practitioner | Admitting: Nurse Practitioner

## 2024-11-28 NOTE — Telephone Encounter (Signed)
 Lvm 2nd attempt by hf 11/28/24

## 2024-12-04 ENCOUNTER — Ambulatory Visit: Admitting: Dermatology

## 2024-12-12 ENCOUNTER — Other Ambulatory Visit: Payer: Self-pay | Admitting: Family Medicine

## 2024-12-12 DIAGNOSIS — I152 Hypertension secondary to endocrine disorders: Secondary | ICD-10-CM

## 2024-12-12 DIAGNOSIS — I480 Paroxysmal atrial fibrillation: Secondary | ICD-10-CM

## 2024-12-12 DIAGNOSIS — E1169 Type 2 diabetes mellitus with other specified complication: Secondary | ICD-10-CM

## 2024-12-12 NOTE — Telephone Encounter (Signed)
 Called to schedule appt no answer no voicemail Letter mailed

## 2024-12-12 NOTE — Telephone Encounter (Signed)
 [facility] NTBS in Jan for 3 mos FU RFs sent to pharmacy

## 2024-12-13 ENCOUNTER — Other Ambulatory Visit: Payer: Self-pay | Admitting: Family Medicine

## 2024-12-13 DIAGNOSIS — E1169 Type 2 diabetes mellitus with other specified complication: Secondary | ICD-10-CM

## 2024-12-13 DIAGNOSIS — E785 Hyperlipidemia, unspecified: Secondary | ICD-10-CM

## 2024-12-15 NOTE — Case Communication (Signed)
 NICS Cross Coverage  Nurse notified our team to review cultures and antibiotics ordered. Providencia is only sensitive to Bactrim .  I have asked pharmacy to review and advise for 7 days of antibiotic therapy.  She has a documented PCN allergy.   Lab Results  Component Value Date/Time   Blood Culture No growth at 5 days 11/19/2024 07:17 AM   Blood Culture No growth at 5 days 11/19/2024 07:17 AM   Urine Culture >100,000 CFU/mL Providencia stuartii (A) 12/13/2024 09:22 AM   Recent Labs    Units 12/15/24 0221  WBC thou/mcL 5.0  HGB gm/dL 88.8*  HCT % 64.7  PLT thou/mcL 201    BP 128/67 (BP Location: Right Upper Arm, Patient Position: Sitting)   Pulse 76   Temp 98.8 F (37.1 C) (Oral)   Resp 18   Ht 1.6 m (5' 3)   Wt 80.2 kg (176 lb 12.8 oz)   SpO2 94%   BMI 31.32 kg/m   Ronal MARLA Kalata, NP 12/15/2024 / 7:37 PM

## 2024-12-15 NOTE — Telephone Encounter (Signed)
 Called/mailed letter 12/12/2024 to schedule appt.

## 2024-12-15 NOTE — Telephone Encounter (Signed)
 [facility] NTBS in Jan for 3 mos FU RF sent to pharmacy

## 2024-12-17 NOTE — Progress Notes (Addendum)
 [facility] Case Management Discharge Note   Patient:   Danielle Parrish MR Number:  [id number] Patient Date of Birth: March 01, 1941 Age/Sex:  83 y.o./female   Discharge Plan   Case Management interviewed: Patient Disposition: Home Health  Home Health/Other Discharge Services: PT PT Services: Arranged  Home Health CM discussed geographic area, insurance, [place] financial affiliation, [place], patient choice and agency will discuss cost invloved, if applicable.: Yes Home Health agency list was: provided Home Health referral agreed upon by interviewee will be sent to: [facility] via [place] Link and expand search if no offers available Home Health Referral Coordination Status: CM requested the Resource Center to send referral to above preference(s) Home Health Liaison Name: [facility] Liaison Phone Number: [phone] Home Health Services accepted, selected and confirmation received through [place] Link: Yes Home Health start of care date provided by agency:  (24-48 hours of dc.) Home Health Agency Services start of care date was provided to: Patient   DME Discharge Planning Discharge Equipment: Bedside Commode, Environmental Consultant, Wheelchair, Hospital Bed, Advertising Copywriter DME Transfer Bench Status: Arranged for home delivery/Pt pickup (Tub Advertising Copywriter) DME Bedside Commode Status: Arranged for home delivery/Pt pickup DME Hospital Bed Status: Arranged for home delivery/Pt pickup (Estimated delivery date 12/19/24) DME [name] Status: Delivered bedside ([employer] to deliver to bedside prior to discharge.) DME Wheelchair Status: Patient declined DME (Insurance will only cover one mobility device. Pt selects walker.) Referral coordination status: CM sent referral DME Liaison Name and Phone Number: Rozelle Sharps [phone] DME referral accepted, selected and confirmation received through NH Link: Yes  Discussed readiness, willingness and ability to provide or support self-management  activities when needed after discharge from the acute care setting with: Patient Coordinated Support Services: No Needs     Transportation   Does the patient need discharge transport arranged?: No Who is arranging transport?: Patient/Grandson Mode of transportation?: private vehicle    Accepted Agency   Selected Continued Care - Admitted Since 11/19/2024     Durable Medical Equipment Coordination complete.    Service Provider Services Address Phone   [facility] [address] [place] [address] [phone]         Home Medical Care Coordination complete.    Service Provider Services Address Phone   [facility] [address] [name] [address] --           CM contacted [employer] rep Redell Pepper who will be bringing portable oxygen  tank for pt to use for transport. Per Redell, pt has home and portable concentrator at baseline. No changes/updated orders needed under current O2 rates.    ADDENDUM 3:25PM:  CM met with pt at bedside to complete IMM and pt requested CM confirm that DME was delivered to the home. CM contacted [employer] rep Landen who reports that DME delivery was attempted yesterday but no one was home. [employer] can deliver BSC, tub bench, and RW to bedside today but hospital bed would not be delivered until Friday. CM provided update to MD and ADT- waiting on response to see if pt can still discharge or if hospital bed will need to be delivered for dc.   ADDENDUM 3:57PM:  MD reports pt can still dc with hospital bed delivered to the home on Friday. Pt notiified and requests tub transfer bench and BSC be delivered to the home as well. CM updated [employer] rep Landon. Just RW will be delivered  bedside.     Electronically signed: Tully LITTIE Mau, MSW 12/17/2024 3:57 PM

## 2024-12-19 ENCOUNTER — Telehealth: Payer: Self-pay

## 2024-12-19 NOTE — Transitions of Care (Post Inpatient/ED Visit) (Signed)
 "  12/19/2024  Name: Danielle Parrish MRN: [id number] DOB: 04-Aug-1941  Today's TOC FU Call Status: Today's TOC FU Call Status:: Successful TOC FU Call Completed TOC FU Call Complete Date: 12/19/24  Patient's Name and Date of Birth confirmed. Name, DOB  Transition Care Management Follow-up Telephone Call Date of Discharge: 12/17/24 Discharge Facility: Other Mudlogger) Name of Other (Non[facility]) Discharge Facility: [facility] Type of Discharge: Inpatient Admission How have you been since you were released from the hospital?: Better Any questions or concerns?: No  Items Reviewed: Did you receive and understand the discharge instructions provided?: Yes Medications obtained,verified, and reconciled?: Yes (Medications Reviewed) Any new allergies since your discharge?: No Dietary orders reviewed?: NA Do you have support at home?: Yes Name of Support/Comfort Primary Source: Auston Beverley Rom, Emergency Contact  [phone] (Mobile)  Medications Reviewed Today: Medications Reviewed Today     Reviewed by Carolee Heron NOVAK, RN (Case Manager) on 12/19/24 at 1703  Med List Status: <None>   Medication Order Taking? Sig Documenting Provider Last Dose Status Informant  acetaminophen  (TYLENOL ) 500 MG tablet [id number]  Take 1 tablet (500 mg total) by mouth every 6 (six) hours as needed.  Patient not taking: Reported on 12/19/2024   Ijaola, Onyeje M, NP  Active   acetaminophen  (TYLENOL ) 650 MG CR tablet [id number] Yes Take 650 mg by mouth 2 (two) times daily as needed for pain. [provider]  Active   albuterol  (VENTOLIN  HFA) 108 (90 Base) MCG/ACT inhaler [id number]  Take 2 puffs eveery 6 hours as needed for wheezing or shortness of breath  Patient not taking: Reported on 12/19/2024   Dettinger, Fonda LABOR, MD  Active   amLODipine  (NORVASC ) 10 MG tablet [id number] Yes TAKE 1 TABLET BY MOUTH EVERY DAY Dettinger, Fonda LABOR, MD  Active   azelastine  (ASTELIN ) 0.1 % nasal spray  [id number] Yes Place 1 spray into both nostrils 2 (two) times daily. Use in each nostril as directed Dettinger, Fonda LABOR, MD  Active   b complex vitamins capsule [id number] Yes Take 1 capsule by mouth daily. [provider]  Active   benzonatate  (TESSALON ) 100 MG capsule [id number] Yes Take 100 mg by mouth 3 (three) times daily as needed for cough. [provider]  Active            Med Note SYDELL, HERON NOVAK   Fri Dec 19, 2024  4:56 PM) For up to 7 days.   Biotin 10 MG CAPS [id number]  Take 10 mg by mouth daily. [provider]  Active   Cholecalciferol (VITAMIN D -3 PO) [id number] Yes Take 1 capsule by mouth daily.  [provider]  Active Self  diclofenac  Sodium (VOLTAREN ) 1 % GEL [id number] Yes APPLY 4 GRAMS TOPICALLY 4 (FOUR) TIMES DAILY. Dettinger, Fonda LABOR, MD  Active   diphenhydrAMINE (BENADRYL) 25 MG tablet [id number]  Take 50 mg by mouth at bedtime as needed for allergies.   Patient not taking: Reported on 12/19/2024   [provider]  Active Self  ELIQUIS  5 MG TABS tablet [id number] Yes TAKE 1 TABLET BY MOUTH TWICE A DAY Dettinger, Fonda LABOR, MD  Active   Ferrous Sulfate  (IRON) 325 (65 Fe) MG TABS [id number] Yes Take 1 tablet by mouth 3 (three) times daily. [provider]  Active   fluorouracil  (EFUDEX ) 5 % cream [id number]  APPLY TOPICALLY 2 TIMES DAILY FOR 14 DAYS. 2 TIMES DAILY TO CHEEKS AND JAW LINE FOR 2 WEEKS. Paci, Karina M, MD  Active  furosemide  (LASIX ) 40 MG tablet [id number] Yes Take 1 tablet (40 mg total) by mouth daily. Miriam Norris, NP  Active   gabapentin  (NEURONTIN ) 300 MG capsule [id number] Yes TAKE 1 CAPSULE BY MOUTH THREE TIMES A DAY Dettinger, Fonda LABOR, MD  Active            Med Note SYDELL, HERON NOVAK   Fri Dec 19, 2024  4:41 PM) 1 capsule 300 mg dose three times a day by mouth.   HYDROcodone -acetaminophen  (NORCO/VICODIN) 5-325 MG tablet [id number] Yes Take 1 tablet by mouth every 8 (eight) hours as needed (Take one tablet by  mouth every 8 (eight) hours as needed for Pain. Max Daily Amount: 3 tablets). [provider]  Active   ipratropium-albuterol  (DUONEB) 0.5-2.5 (3) MG/3ML SOLN [id number] Yes Take 3 mLs by nebulization every 6 (six) hours as needed. [provider]  Active   K Phos Mono-Sod Phos Di & Mono (WES-PHOS 250 NEUTRAL) [id number] MG TABS [id number] Yes Take 1 tablet by mouth 2 (two) times daily. Quantity 4 tablets. [provider]  Active   ketoconazole  (NIZORAL ) 2 % cream [id number]  APPLY TO AFFECTED AREA EVERYDAY  Patient not taking: Reported on 12/19/2024   Jolinda Potter M, DO  Active   levothyroxine  (SYNTHROID ) 100 MCG tablet [id number] Yes Take 1 tablet (100 mcg total) by mouth daily before breakfast. Dettinger, Fonda LABOR, MD  Active   lidocaine  (LIDODERM ) 5 % [id number] Yes PLACE 1 PATCH ONTO THE SKIN DAILY. REMOVE & DISCARD PATCH WITHIN 12 HOURS OR AS DIRECTED BY MD  Patient taking differently: Place 1 patch onto the skin daily. Remove & Discard patch within 12 hours or as directed by MD. Patient using as needed.   Dettinger, Fonda LABOR, MD  Active   linaclotide  (LINZESS ) 145 MCG CAPS capsule [id number]  Take 1 capsule (145 mcg total) by mouth as needed.  Patient not taking: Reported on 12/19/2024   Dettinger, Fonda LABOR, MD  Active   lovastatin  (MEVACOR ) 40 MG tablet [id number]  TAKE 1 TABLET BY MOUTH EVERYDAY AT BEDTIME Dettinger, Fonda LABOR, MD  Active   magnesium  oxide (MAG-OX) 400 (240 Mg) MG tablet [id number] Yes Take 1 tablet (400 mg total) by mouth daily. Miriam Norris, NP  Active   methocarbamol (ROBAXIN) 500 MG tablet [id number] Yes Take 500 mg by mouth 3 (three) times daily as needed for muscle spasms (For up to 5 days). [provider]  Active   metoprolol  succinate (TOPROL -XL) 25 MG 24 hr tablet [id number] Yes Take 0.5 tablets (12.5 mg total) by mouth daily. Sheron Lorette GRADE, PA-C  Active   mirabegron  ER (MYRBETRIQ ) 25 MG TB24 tablet [id number]  Take 1 tablet  (25 mg total) by mouth daily.  Patient not taking: Reported on 12/19/2024   Dettinger, Fonda LABOR, MD  Active   Multiple Vitamin (MULTIVITAMIN WITH MINERALS) TABS tablet [id number] Yes Take 1 tablet by mouth daily. [provider]  Active   Multiple Vitamins-Minerals (PRESERVISION AREDS 2 PO) [id number]  Take by mouth. [provider]  Active   naloxone [facility]) nasal spray 4 mg/0.1 mL [id number] Yes Place 1 spray into the nose as needed (Use 1 spray in nostril for Opioid Reversal. Call 911. Repeat in 3 minutes as needed if no or minimal response. Quantity: 2 each). [provider]  Active   nystatin  (MYCOSTATIN /NYSTOP ) powder [id number]  APPLY TO AFFECTED AREA 4 TIMES A DAY  Patient not taking: Reported on 12/19/2024   Dettinger, Fonda LABOR, MD  Active  olmesartan  (BENICAR ) 20 MG tablet [id number] Yes TAKE 1 TABLET BY MOUTH EVERY DAY Dettinger, Fonda LABOR, MD  Active   Omega-3 Fatty Acids (FISH OIL) 1200 MG CAPS [id number] Yes Take 1 capsule by mouth daily. [provider]  Active   omeprazole  (PRILOSEC) 20 MG capsule [id number] Yes Take 1 capsule (20 mg total) by mouth 2 (two) times daily before a meal. Dettinger, Fonda LABOR, MD  Active   Polyethyl Glycol-Propyl Glycol 0.4-0.3 % SOLN [id number] Yes Apply 1 drop to eye in the morning and at bedtime. Administer in BOTH eyes. [provider]  Active   polyethylene glycol (MIRALAX  / GLYCOLAX ) 17 g packet [id number] Yes Take 17 g by mouth daily. [provider]  Active   pyridOXINE (VITAMIN B-6) 100 MG tablet [id number]  Take 100 mg by mouth daily. [provider]  Active Self  sertraline  (ZOLOFT ) 100 MG tablet [id number] Yes Take 2 tablets (200 mg total) by mouth daily.  Patient taking differently: Take 200 mg by mouth daily. Patient states she has always taking this twice a day. Will review with PCP   Dettinger, Fonda LABOR, MD  Active   simethicone (FT GAS RELIEF) 80 MG chewable tablet [id number] Yes Chew 80  mg by mouth 4 (four) times daily as needed for flatulence. [provider]  Active   sodium chloride  (OCEAN) 0.65 % SOLN nasal spray [id number]  Place 1 spray into both nostrils as needed for congestion.  Patient not taking: Reported on 12/19/2024   Harl Jayson CROME, MD  Active Self  sulfamethoxazole -trimethoprim  (BACTRIM  DS) 800-160 MG tablet [id number] Yes Take 1 tablet by mouth 2 (two) times daily. For 5 doses every 12 hours [provider]  Active   Tiotropium Bromide (SPIRIVA HANDIHALER) 18 MCG CAPS [id number] Yes Place 1 Inhaler into inhaler and inhale daily. [provider]  Active             Home Care and Equipment/Supplies: Were Home Health Services Ordered?: Yes Name of Home Health Agency:: [facility] Has Agency set up a time to come to your home?: Yes First Home Health Visit Date: 12/22/24 Any new equipment or medical supplies ordered?: Yes Name of Medical supply agency?: [employer] [facility], [employer]  Were you able to get the equipment/medical supplies?: Yes Do you have any questions related to the use of the equipment/supplies?: No  Functional Questionnaire: Do you need assistance with bathing/showering or dressing?: No Do you need assistance with meal preparation?: No Do you need assistance with eating?: No Do you have difficulty maintaining continence: No Do you need assistance with getting out of bed/getting out of a chair/moving?: No Do you have difficulty managing or taking your medications?: No  Follow up appointments reviewed: PCP Follow-up appointment confirmed?: No (Patient will call Monday to make HFU appointment) MD Provider Line Number:[phone] Given: No Specialist Hospital Follow-up appointment confirmed?: Yes Date of Specialist follow-up appointment?: 12/05/24 Follow-Up Specialty Provider:: GI Liver lesion follow up Do you need transportation to your follow-up appointment?: No Do you understand care options if your  condition(s) worsen?: Yes-patient verbalized understanding  SDOH Interventions Today    Flowsheet Row Most Recent Value  SDOH Interventions   Food Insecurity Interventions Intervention Not Indicated  Housing Interventions Intervention Not Indicated  Transportation Interventions Intervention Not Indicated, Patient Resources (Friends/Family)  Utilities Interventions Intervention Not Indicated   Successful post discharge outreach with patient and significant other by phone today.  Patient verbally declined need for follow up call or 30 day program via  TOC.  Patient understands discharge instructions and what to call provider for if condition worsens.  Patient to call to to schedule PCP HFU per preference after rationale provided/education on need.  Patient has specialist follow up appointments Care Gaps reviewed, patient had received flu vaccine this year.  The patient verbalized understanding with current POC and to contact PCP or specialist for any urgent questions or concerns.  The patient is directed to their insurance card regarding availability of benefits coverage  No changes to SDOH, patient reported they understood discharge instructions, medication review completed, medications obtained and understood what they are for and when to take, what to call provider for, no acute issues reported on assessment items, and no red flags noted on this call unless documented under assessment.      Bing Edison MSN, RN RN Case Sales [facility]  [facility] Office Hours M-F [phone] Direct Dial: [phone] Main Phone [phone]  Fax: [phone] [email or web address]    "

## 2024-12-22 ENCOUNTER — Telehealth: Payer: Self-pay | Admitting: Family Medicine

## 2024-12-22 NOTE — Telephone Encounter (Signed)
 Copied from CRM [id number]. Topic: Clinical - Home Health Verbal Orders >> Dec 22, 2024 10:54 AM Diannia DEL wrote: Caller/Agency: [facility] Rushing Number: [phone] Service Requested: Skilled Nursing Frequency: 2 week 1, 1 week 8, with 3 PRN's Any new concerns about the patient? No

## 2024-12-22 NOTE — Telephone Encounter (Signed)
"  Verbal given  "

## 2024-12-26 ENCOUNTER — Ambulatory Visit: Admitting: Family Medicine

## 2024-12-26 ENCOUNTER — Encounter: Payer: Self-pay | Admitting: Family Medicine

## 2024-12-26 VITALS — BP 132/78 | HR 85 | Ht 62.0 in | Wt 173.0 lb

## 2024-12-26 DIAGNOSIS — K769 Liver disease, unspecified: Secondary | ICD-10-CM

## 2024-12-26 DIAGNOSIS — N3000 Acute cystitis without hematuria: Secondary | ICD-10-CM | POA: Diagnosis not present

## 2024-12-26 DIAGNOSIS — K567 Ileus, unspecified: Secondary | ICD-10-CM

## 2024-12-26 DIAGNOSIS — J9622 Acute and chronic respiratory failure with hypercapnia: Secondary | ICD-10-CM | POA: Diagnosis not present

## 2024-12-26 DIAGNOSIS — E871 Hypo-osmolality and hyponatremia: Secondary | ICD-10-CM

## 2024-12-26 DIAGNOSIS — J9621 Acute and chronic respiratory failure with hypoxia: Secondary | ICD-10-CM

## 2024-12-26 DIAGNOSIS — S22089S Unspecified fracture of T11-T12 vertebra, sequela: Secondary | ICD-10-CM | POA: Diagnosis not present

## 2024-12-26 NOTE — Progress Notes (Signed)
 "  BP 132/78   Pulse 85   Ht 5' 2 (1.575 m)   Wt 173 lb (78.5 kg)   SpO2 94%   BMI 31.64 kg/m    Subjective:   Patient ID: Danielle Parrish, female    DOB: 01-Sep-1941, 84 y.o.   MRN: [id number]  HPI: Danielle Parrish is a 84 y.o. female presenting on 12/26/2024 for Hospitalization Follow-up   Discussed the use of AI scribe software for clinical note transcription with the patient, who gave verbal consent to proceed.  History of Present Illness   Danielle Parrish is an 84 year old female who presents for a hospital follow-up and transition of care visit after a recent hospitalization.  Thoracic vertebral fracture and postoperative status - Sustained a closed T11 fracture following a fall on 10/31/2024 - Underwent surgical intervention for the fracture during hospitalization - Initially placed in a nursing home post-surgery, now receiving care at home with preference for home setting  Gastrointestinal function and ileus - Developed ileus during hospitalization - Bowel movements have resumed with the aid of Miralax , taken twice daily - Previously experienced severe constipation with hard stools, now significantly improved  Urinary tract infection - Treated for urinary tract infection during hospitalization - Believes infection has resolved following antibiotic therapy  Electrolyte abnormalities - Experienced hyponatremia and hypomagnesemia during hospital stay - Sodium and magnesium  levels were low during hospitalization  Hepatic lesion - CT abdomen revealed a small punctate liver lesion - Lesion deemed too small to characterize, likely benign  Respiratory failure and infectious complications - Experienced acute on chronic respiratory failure with hypoxia during hospitalization - Diagnosed with pneumonia twice during hospital stay - Isolated for two weeks due to influenza, treated with Tamiflu - Continues to use supplemental oxygen  at 2 liters, consistent with  pre-hospitalization status  Acute myocardial infarction and cardiac intervention - Suffered a STEMI during hospitalization - Underwent stent placement - Has a scheduled cardiology follow-up appointment, which was missed  Home health and rehabilitation - Receives therapy at home twice weekly - Receives nurse visits once weekly     TOC visit, patient was contacted on 12/19/2024 by Heron Edison, RN for transitional care phone visit     Relevant past medical, surgical, family and social history reviewed and updated as indicated. Interim medical history since our last visit reviewed. Allergies and medications reviewed and updated.  Review of Systems  Constitutional:  Negative for chills and fever.  HENT:  Negative for congestion.   Eyes:  Negative for redness and visual disturbance.  Respiratory:  Positive for shortness of breath. Negative for chest tightness and wheezing.   Cardiovascular:  Negative for chest pain and leg swelling.  Genitourinary:  Negative for difficulty urinating and dysuria.  Musculoskeletal:  Negative for back pain and gait problem.  Skin:  Negative for rash.  Neurological:  Negative for light-headedness and headaches.  Psychiatric/Behavioral:  Negative for agitation and behavioral problems.   All other systems reviewed and are negative.   Per HPI unless specifically indicated above   Allergies as of 12/26/2024       Reactions   Nitrofurantoin  Shortness Of Breath   Likely macrodantin  pulmonary toxicity 06/2022    Nsaids    Burns stomach.    Penicillins Hives, Other (See Comments)   Has patient had a PCN reaction causing immediate rash, facial/tongue/throat swelling, SOB or lightheadedness with hypotension: Yes Has patient had a PCN reaction causing severe rash involving mucus membranes or skin necrosis: unknown  Has patient had a PCN reaction that required hospitalization No Has patient had a PCN reaction occurring within the last 10 years: No If all of the  above answers are NO, then may proceed with Cephalosporin use.   Rosuvastatin Other (See Comments)   Body aches.   Statins Other (See Comments)   Vytorin [ezetimibe-simvastatin]    Body aches.    Aspirin Nausea Only   Tolmetin Nausea Only   Burns stomach.         Medication List        Accurate as of December 26, 2024  3:34 PM. If you have any questions, ask your nurse or doctor.          STOP taking these medications    albuterol  108 (90 Base) MCG/ACT inhaler Commonly known as: VENTOLIN  HFA Stopped by: Fonda Levins, MD   diphenhydrAMINE 25 MG tablet Commonly known as: BENADRYL Stopped by: Fonda Levins, MD   ketoconazole  2 % cream Commonly known as: NIZORAL  Stopped by: Fonda Levins, MD   linaclotide  145 MCG Caps capsule Commonly known as: LINZESS  Stopped by: Fonda Levins, MD   mirabegron  ER 25 MG Tb24 tablet Commonly known as: Myrbetriq  Stopped by: Fonda Levins, MD   nystatin  powder Commonly known as: MYCOSTATIN /NYSTOP  Stopped by: Fonda Levins, MD   sodium chloride  0.65 % Soln nasal spray Commonly known as: OCEAN Stopped by: Fonda Levins, MD       TAKE these medications    acetaminophen  650 MG CR tablet Commonly known as: TYLENOL  Take 650 mg by mouth 2 (two) times daily as needed for pain. What changed: Another medication with the same name was removed. Continue taking this medication, and follow the directions you see here. Changed by: Fonda Levins, MD   amLODipine  10 MG tablet Commonly known as: NORVASC  TAKE 1 TABLET BY MOUTH EVERY DAY   azelastine  0.1 % nasal spray Commonly known as: ASTELIN  Place 1 spray into both nostrils 2 (two) times daily. Use in each nostril as directed   b complex vitamins capsule Take 1 capsule by mouth daily.   benzonatate  100 MG capsule Commonly known as: TESSALON  Take 100 mg by mouth 3 (three) times daily as needed for cough.   Biotin 10 MG Caps Take 10 mg by mouth daily.    diclofenac  Sodium 1 % Gel Commonly known as: VOLTAREN  APPLY 4 GRAMS TOPICALLY 4 (FOUR) TIMES DAILY.   Eliquis  5 MG Tabs tablet Generic drug: apixaban  TAKE 1 TABLET BY MOUTH TWICE A DAY   Fish Oil 1200 MG Caps Take 1 capsule by mouth daily.   fluorouracil  5 % cream Commonly known as: EFUDEX  APPLY TOPICALLY 2 TIMES DAILY FOR 14 DAYS. 2 TIMES DAILY TO CHEEKS AND JAW LINE FOR 2 WEEKS.   FT Gas Relief 80 MG chewable tablet Generic drug: simethicone Chew 80 mg by mouth 4 (four) times daily as needed for flatulence.   furosemide  40 MG tablet Commonly known as: LASIX  Take 1 tablet (40 mg total) by mouth daily.   gabapentin  300 MG capsule Commonly known as: NEURONTIN  TAKE 1 CAPSULE BY MOUTH THREE TIMES A DAY   HYDROcodone -acetaminophen  5-325 MG tablet Commonly known as: NORCO/VICODIN Take 1 tablet by mouth every 8 (eight) hours as needed (Take one tablet by mouth every 8 (eight) hours as needed for Pain. Max Daily Amount: 3 tablets).   ipratropium-albuterol  0.5-2.5 (3) MG/3ML Soln Commonly known as: DUONEB Take 3 mLs by nebulization every 6 (six) hours as needed.   Iron 325 (65  Fe) MG Tabs Take 1 tablet by mouth 3 (three) times daily.   levothyroxine  100 MCG tablet Commonly known as: SYNTHROID  Take 1 tablet (100 mcg total) by mouth daily before breakfast.   lidocaine  5 % Commonly known as: LIDODERM  PLACE 1 PATCH ONTO THE SKIN DAILY. REMOVE & DISCARD PATCH WITHIN 12 HOURS OR AS DIRECTED BY MD What changed: additional instructions   lovastatin  40 MG tablet Commonly known as: MEVACOR  TAKE 1 TABLET BY MOUTH EVERYDAY AT BEDTIME   magnesium  oxide 400 (240 Mg) MG tablet Commonly known as: MAG-OX Take 1 tablet (400 mg total) by mouth daily.   methocarbamol 500 MG tablet Commonly known as: ROBAXIN Take 500 mg by mouth 3 (three) times daily as needed for muscle spasms (For up to 5 days).   metoprolol  succinate 25 MG 24 hr tablet Commonly known as: TOPROL -XL Take 0.5  tablets (12.5 mg total) by mouth daily.   multivitamin with minerals Tabs tablet Take 1 tablet by mouth daily.   naloxone 4 MG/0.1ML Liqd nasal spray kit Commonly known as: NARCAN Place 1 spray into the nose as needed (Use 1 spray in nostril for Opioid Reversal. Call 911. Repeat in 3 minutes as needed if no or minimal response. Quantity: 2 each).   olmesartan  20 MG tablet Commonly known as: BENICAR  TAKE 1 TABLET BY MOUTH EVERY DAY   omeprazole  20 MG capsule Commonly known as: PRILOSEC Take 1 capsule (20 mg total) by mouth 2 (two) times daily before a meal.   Polyethyl Glycol-Propyl Glycol 0.4-0.3 % Soln Apply 1 drop to eye in the morning and at bedtime. Administer in BOTH eyes.   polyethylene glycol 17 g packet Commonly known as: MIRALAX  / GLYCOLAX  Take 17 g by mouth daily.   PRESERVISION AREDS 2 PO Take by mouth.   pyridOXINE 100 MG tablet Commonly known as: VITAMIN B6 Take 100 mg by mouth daily.   sertraline  100 MG tablet Commonly known as: ZOLOFT  Take 2 tablets (200 mg total) by mouth daily. What changed: additional instructions   Spiriva HandiHaler 18 MCG Caps Generic drug: Tiotropium Bromide Place 1 Inhaler into inhaler and inhale daily.   VITAMIN D -3 PO Take 1 capsule by mouth daily.   Wes-Phos 250 Neutral 155-852-130 MG Tabs Take 1 tablet by mouth 2 (two) times daily. Quantity 4 tablets.         Objective:   BP 132/78   Pulse 85   Ht 5' 2 (1.575 m)   Wt 173 lb (78.5 kg)   SpO2 94%   BMI 31.64 kg/m   Wt Readings from Last 3 Encounters:  12/26/24 173 lb (78.5 kg)  10/24/24 185 lb (83.9 kg)  10/10/24 191 lb 3.2 oz (86.7 kg)    Physical Exam Physical Exam   CHEST: Lungs clear to auscultation bilaterally. CARDIOVASCULAR: Heart regular rate and rhythm. MUSCULOSKELETAL: Spine non-tender.     2 L nasal cannula oxygen     Assessment & Plan:   Problem List Items Addressed This Visit   None Visit Diagnoses       Acute cystitis without  hematuria    -  Primary     Acute on chronic respiratory failure with hypoxia and hypercapnia (HCC)       Relevant Orders   CBC With Diff/Platelet   CMP14+EGFR     Hyponatremia       Relevant Orders   CBC With Diff/Platelet   CMP14+EGFR     Closed fracture of eleventh thoracic vertebra, unspecified fracture morphology, sequela  Relevant Orders   CBC With Diff/Platelet   CMP14+EGFR     Liver lesion       Relevant Orders   CBC With Diff/Platelet   CMP14+EGFR     Ileus (HCC)       Relevant Orders   CBC With Diff/Platelet   CMP14+EGFR          STEMI, status post stent placement Status post stent placement for STEMI. - Reschedule appointment with cardiologist.  Closed fracture of T11 vertebra, sequela Underwent surgery with no current pain or mobility issues. - Continue physical therapy twice a week.  Acute on chronic respiratory failure with hypoxia Requires 2 liters of oxygen  continuously. - Continue 2 liters of oxygen  therapy.  Hyponatremia Sodium levels to be rechecked. - Ordered blood work to recheck sodium levels.  Liver lesion Small punctate liver lesion identified on CT abdomen, likely benign and too small to characterize. No immediate concern.          Follow up plan: Return if symptoms worsen or fail to improve, for 1 to 25-month follow-up and blood work.  Counseling provided for all of the vaccine components Orders Placed This Encounter  Procedures   CBC With Diff/Platelet   CMP14+EGFR    Fonda Levins, MD [facility] 12/26/2024, 3:34 PM     "

## 2024-12-27 LAB — CBC WITH DIFF/PLATELET
Basophils Absolute: 0 x10E3/uL (ref 0.0–0.2)
Basos: 1 %
EOS (ABSOLUTE): 0 x10E3/uL (ref 0.0–0.4)
Eos: 0 %
Hematocrit: 38.8 % (ref 34.0–46.6)
Hemoglobin: 12.7 g/dL (ref 11.1–15.9)
Immature Grans (Abs): 0 x10E3/uL (ref 0.0–0.1)
Immature Granulocytes: 0 %
Lymphocytes Absolute: 2.2 x10E3/uL (ref 0.7–3.1)
Lymphs: 41 %
MCH: 32.5 pg (ref 26.6–33.0)
MCHC: 32.7 g/dL (ref 31.5–35.7)
MCV: 99 fL — ABNORMAL HIGH (ref 79–97)
Monocytes Absolute: 0.5 x10E3/uL (ref 0.1–0.9)
Monocytes: 9 %
Neutrophils Absolute: 2.7 x10E3/uL (ref 1.4–7.0)
Neutrophils: 49 %
Platelets: 246 x10E3/uL (ref 150–450)
RBC: 3.91 x10E6/uL (ref 3.77–5.28)
RDW: 11.7 % (ref 11.7–15.4)
WBC: 5.4 x10E3/uL (ref 3.4–10.8)

## 2024-12-27 LAB — CMP14+EGFR
ALT: 15 IU/L (ref 0–32)
AST: 17 IU/L (ref 0–40)
Albumin: 4.1 g/dL (ref 3.7–4.7)
Alkaline Phosphatase: 106 IU/L (ref 48–129)
BUN/Creatinine Ratio: 21 (ref 12–28)
BUN: 15 mg/dL (ref 8–27)
Bilirubin Total: 0.4 mg/dL (ref 0.0–1.2)
CO2: 27 mmol/L (ref 20–29)
Calcium: 10.6 mg/dL — AB (ref 8.7–10.3)
Chloride: 95 mmol/L — AB (ref 96–106)
Creatinine, Ser: 0.73 mg/dL (ref 0.57–1.00)
Globulin, Total: 2.1 g/dL (ref 1.5–4.5)
Glucose: 137 mg/dL — AB (ref 70–99)
Potassium: 4.8 mmol/L (ref 3.5–5.2)
Sodium: 133 mmol/L — AB (ref 134–144)
Total Protein: 6.2 g/dL (ref 6.0–8.5)
eGFR: 81 mL/min/1.73

## 2024-12-31 NOTE — Progress Notes (Signed)
 Danielle Parrish

## 2025-01-01 ENCOUNTER — Ambulatory Visit: Admitting: Adult Health

## 2025-01-01 ENCOUNTER — Encounter: Payer: Self-pay | Admitting: Adult Health

## 2025-01-01 VITALS — BP 144/78 | HR 74 | Ht 62.0 in | Wt 176.8 lb

## 2025-01-01 DIAGNOSIS — G4733 Obstructive sleep apnea (adult) (pediatric): Secondary | ICD-10-CM

## 2025-01-01 DIAGNOSIS — Z9981 Dependence on supplemental oxygen: Secondary | ICD-10-CM

## 2025-01-01 NOTE — Progress Notes (Signed)
 "   PATIENT: Danielle Parrish DOB: May 19, 1941  REASON FOR VISIT: follow up HISTORY FROM: patient  Chief Complaint  Patient presents with   Follow-up    Pt in 19       HISTORY OF PRESENT ILLNESS: Today 01/01/2025:  Danielle Parrish is a 84 y.o. female with a history of obstructive sleep apnea on CPAP. Returns today for follow-up.  She reports that CPAP is working well.  She states that she has been in the hospital twice in the last 2 months.  Spent 5 days at a nursing facility.  She is now back at home and doing better.  Her CPAP machine is giving her the message that it has exceeded motor life.  Her last study was in 2023.  Her download is below     09/25/23: Danielle Parrish is a 84 y.o. female with a history of OSA on CPAP. Returns today for follow-up.  She reports that the CPAP is working well.  She states occasionally her oxygen  level during the day will drop down to the 80s but it will pop back up to the 90s.  Pulmonology is prescribing her oxygen .  She does wear supplemental oxygen  at night.  Needs new tubing.  Download is below       09/19/22: Danielle Parrish is an 84 year old female with a history of obstructive sleep apnea on CPAP.  She returns today for follow-up.  She states that the CPAP is working well for her.  She is still struggling with fatigue but has saw pulmonology and is currently in physical therapy.  She feels that so far has been helpful.  Her download is below.  She does have supplemental O2 bled into her CPAP     10/25/21: Danielle Parrish is an 84 year old female with a history of obstructive sleep apnea on CPAP.  She returns today for follow-up.  She reports that she is now on nocturnal oxygen .  She states that she did not feel like it was working right with her CPAP so she stopped using the CPAP machine in the last 2 weeks.  Her download is below      REVIEW OF SYSTEMS: Out of a complete 14 system review of symptoms, the patient complains only of the  following symptoms, and all other reviewed systems are negative.  FSS 53 ESS 4  ALLERGIES: Allergies  Allergen Reactions   Nitrofurantoin  Shortness Of Breath    Likely macrodantin  pulmonary toxicity 06/2022    Nsaids     Burns stomach.    Penicillins Hives and Other (See Comments)    Has patient had a PCN reaction causing immediate rash, facial/tongue/throat swelling, SOB or lightheadedness with hypotension: Yes  Has patient had a PCN reaction causing severe rash involving mucus membranes or skin necrosis: unknown  Has patient had a PCN reaction that required hospitalization No  Has patient had a PCN reaction occurring within the last 10 years: No  If all of the above answers are NO, then may proceed with Cephalosporin use.   Rosuvastatin Other (See Comments)    Body aches.   Statins Other (See Comments)   Vytorin [Ezetimibe-Simvastatin]     Body aches.    Aspirin Nausea Only   Tolmetin Nausea Only    Burns stomach.     HOME MEDICATIONS: Outpatient Medications Prior to Visit  Medication Sig Dispense Refill   acetaminophen  (TYLENOL ) 650 MG CR tablet Take 650 mg by mouth 2 (two) times daily as needed for pain.  amLODipine  (NORVASC ) 10 MG tablet TAKE 1 TABLET BY MOUTH EVERY DAY 90 tablet 0   azelastine  (ASTELIN ) 0.1 % nasal spray Place 1 spray into both nostrils 2 (two) times daily. Use in each nostril as directed 30 mL 12   b complex vitamins capsule Take 1 capsule by mouth daily.     benzonatate  (TESSALON ) 100 MG capsule Take 100 mg by mouth 3 (three) times daily as needed for cough.     Biotin 10 MG CAPS Take 10 mg by mouth daily.     Cholecalciferol (VITAMIN D -3 PO) Take 1 capsule by mouth daily.      diclofenac  Sodium (VOLTAREN ) 1 % GEL APPLY 4 GRAMS TOPICALLY 4 (FOUR) TIMES DAILY. 400 g 1   ELIQUIS  5 MG TABS tablet TAKE 1 TABLET BY MOUTH TWICE A DAY 180 tablet 0   Ferrous Sulfate  (IRON) 325 (65 Fe) MG TABS Take 1 tablet by mouth 3 (three) times daily.      fluorouracil  (EFUDEX ) 5 % cream APPLY TOPICALLY 2 TIMES DAILY FOR 14 DAYS. 2 TIMES DAILY TO CHEEKS AND JAW LINE FOR 2 WEEKS. 40 g 0   furosemide  (LASIX ) 40 MG tablet Take 1 tablet (40 mg total) by mouth daily. 90 tablet 0   gabapentin  (NEURONTIN ) 300 MG capsule TAKE 1 CAPSULE BY MOUTH THREE TIMES A DAY 270 capsule 0   HYDROcodone -acetaminophen  (NORCO/VICODIN) 5-325 MG tablet Take 1 tablet by mouth every 8 (eight) hours as needed (Take one tablet by mouth every 8 (eight) hours as needed for Pain. Max Daily Amount: 3 tablets).     ipratropium-albuterol  (DUONEB) 0.5-2.5 (3) MG/3ML SOLN Take 3 mLs by nebulization every 6 (six) hours as needed.     K Phos Mono-Sod Phos Di & Mono (WES-PHOS 250 NEUTRAL) 155-852-130 MG TABS Take 1 tablet by mouth 2 (two) times daily. Quantity 4 tablets.     levothyroxine  (SYNTHROID ) 100 MCG tablet Take 1 tablet (100 mcg total) by mouth daily before breakfast. 90 tablet 3   lidocaine  (LIDODERM ) 5 % PLACE 1 PATCH ONTO THE SKIN DAILY. REMOVE & DISCARD PATCH WITHIN 12 HOURS OR AS DIRECTED BY MD (Patient taking differently: Place 1 patch onto the skin daily. Remove & Discard patch within 12 hours or as directed by MD. Patient using as needed.) 30 patch 5   lovastatin  (MEVACOR ) 40 MG tablet TAKE 1 TABLET BY MOUTH EVERYDAY AT BEDTIME 90 tablet 0   magnesium  oxide (MAG-OX) 400 (240 Mg) MG tablet Take 1 tablet (400 mg total) by mouth daily. 90 tablet 1   methocarbamol (ROBAXIN) 500 MG tablet Take 500 mg by mouth 3 (three) times daily as needed for muscle spasms (For up to 5 days).     metoprolol  succinate (TOPROL -XL) 25 MG 24 hr tablet Take 0.5 tablets (12.5 mg total) by mouth daily. 45 tablet 1   Multiple Vitamin (MULTIVITAMIN WITH MINERALS) TABS tablet Take 1 tablet by mouth daily.     Multiple Vitamins-Minerals (PRESERVISION AREDS 2 PO) Take by mouth.     naloxone (NARCAN) nasal spray 4 mg/0.1 mL Place 1 spray into the nose as needed (Use 1 spray in nostril for Opioid Reversal. Call  911. Repeat in 3 minutes as needed if no or minimal response. Quantity: 2 each).     olmesartan  (BENICAR ) 20 MG tablet TAKE 1 TABLET BY MOUTH EVERY DAY 90 tablet 0   Omega-3 Fatty Acids (FISH OIL) 1200 MG CAPS Take 1 capsule by mouth daily.     omeprazole  (PRILOSEC) 20  MG capsule Take 1 capsule (20 mg total) by mouth 2 (two) times daily before a meal. 180 capsule 3   Polyethyl Glycol-Propyl Glycol 0.4-0.3 % SOLN Apply 1 drop to eye in the morning and at bedtime. Administer in BOTH eyes.     polyethylene glycol (MIRALAX  / GLYCOLAX ) 17 g packet Take 17 g by mouth daily.     pyridOXINE (VITAMIN B-6) 100 MG tablet Take 100 mg by mouth daily.     sertraline  (ZOLOFT ) 100 MG tablet Take 2 tablets (200 mg total) by mouth daily. (Patient taking differently: Take 200 mg by mouth daily. Patient states she has always taking this twice a day. Will review with PCP) 180 tablet 3   simethicone (FT GAS RELIEF) 80 MG chewable tablet Chew 80 mg by mouth 4 (four) times daily as needed for flatulence.     Tiotropium Bromide (SPIRIVA HANDIHALER) 18 MCG CAPS Place 1 Inhaler into inhaler and inhale daily.     No facility-administered medications prior to visit.    PAST MEDICAL HISTORY: Past Medical History:  Diagnosis Date   Anxiety    Arthritis    CHF (congestive heart failure) (HCC)    Chronic pain of multiple joints    Depression    Essential hypertension    GERD (gastroesophageal reflux disease)    Hyperlipidemia    Hyperlipidemia associated with type 2 diabetes mellitus (HCC)    Hypertension associated with type 2 diabetes mellitus (HCC)    Hypothyroidism    Mild scoliosis 10/19/2021   Osteoporosis    Sleep apnea    CPAP   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, left 09/05/2023   left wrist   needs excision   Squamous cell carcinoma of skin 09/05/2023   left ear   needs mohs   Type 2 diabetes mellitus with hypercholesterolemia (HCC)    Vitamin D  deficiency     PAST SURGICAL HISTORY: Past Surgical  History:  Procedure Laterality Date   ABDOMINAL HERNIA REPAIR     ABDOMINAL HYSTERECTOMY  1977   BREAST EXCISIONAL BIOPSY Right    EYE SURGERY  2020   bilateral cataract removal    KNEE ARTHROSCOPY Left 09/30/2014   Procedure: LEFT KNEE ARTHROSCOPY MEDIAL MENISECTOMY ABRASION CHONDROPLASTY SYNOVECTOMY SUPRAPATELLER CUFF;  Surgeon: Tanda DELENA Heading, MD;  Location: [facility];  Service: Orthopedics;  Laterality: Left;    FAMILY HISTORY: Family History  Problem Relation Age of Onset   Hypertension Mother    Stroke Mother    Cancer Father    Sleep apnea Grandson    Sleep apnea Daughter     SOCIAL HISTORY: Social History   Socioeconomic History   Marital status: Widowed    Spouse name: Rutha   Number of children: 2   Years of education: Not on file   Highest education level: High school graduate  Occupational History   Occupation: CNA    Comment: [employer]   Occupation: retired  Tobacco Use   Smoking status: Never    Passive exposure: Past   Smokeless tobacco: Never  Vaping Use   Vaping status: Never Used  Substance and Sexual Activity   Alcohol use: No   Drug use: No   Sexual activity: Not Currently  Other Topics Concern   Not on file  Social History Narrative   Lives with her daughter   Not very active socially or physically   Social Drivers of Health   Tobacco Use: Low Risk (01/01/2025)   Patient History  Smoking Tobacco Use: Never    Smokeless Tobacco Use: Never    Passive Exposure: Past  Financial Resource Strain: Low Risk (08/15/2024)   Overall Financial Resource Strain (CARDIA)    Difficulty of Paying Living Expenses: Not hard at all  Food Insecurity: No Food Insecurity (12/19/2024)   Epic    Worried About Programme Researcher, Broadcasting/film/video in the Last Year: Never true    Ran Out of Food in the Last Year: Never true  Transportation Needs: No Transportation Needs (12/19/2024)   Epic    Lack of Transportation (Medical): No    Lack of Transportation (Non-Medical): No   Physical Activity: Insufficiently Active (08/15/2024)   Exercise Vital Sign    Days of Exercise per Week: 2 days    Minutes of Exercise per Session: 30 min  Stress: No Stress Concern Present (11/19/2024)   Received from [facility] - Occupational Stress Questionnaire    Do you feel stress - tense, restless, nervous, or anxious, or unable to sleep at night because your mind is troubled all the time - these days?: Only a little  Social Connections: Socially Isolated (08/15/2024)   Social Connection and Isolation Panel    Frequency of Communication with Friends and Family: More than three times a week    Frequency of Social Gatherings with Friends and Family: More than three times a week    Attends Religious Services: Never    Database Administrator or Organizations: No    Attends Banker Meetings: Never    Marital Status: Widowed  Intimate Partner Violence: Not At Risk (12/19/2024)   Epic    Fear of Current or Ex-Partner: No    Emotionally Abused: No    Physically Abused: No    Sexually Abused: No  Depression (PHQ2-9): Medium Risk (12/26/2024)   Depression (PHQ2-9)    PHQ-2 Score: 9  Alcohol Screen: Low Risk (08/15/2024)   Alcohol Screen    Last Alcohol Screening Score (AUDIT): 0  Housing: Low Risk (12/19/2024)   Epic    Unable to Pay for Housing in the Last Year: No    Number of Times Moved in the Last Year: 0    Homeless in the Last Year: No  Utilities: Not At Risk (12/19/2024)   Epic    Threatened with loss of utilities: No  Health Literacy: Adequate Health Literacy (08/15/2024)   B1300 Health Literacy    Frequency of need for help with medical instructions: Never      PHYSICAL EXAM  Vitals:   01/01/25 1134  BP: (!) 144/78  Pulse: 74  Weight: 176 lb 12.8 oz (80.2 kg)  Height: 5' 2 (1.575 m)     Body mass index is 32.34 kg/m.  Generalized: Well developed, in no acute distress  Chest: Lungs clear to  auscultation bilaterally  Neurological examination  Mentation: Alert oriented to time, place, history taking. Follows all commands speech and language fluent Cranial nerve II-XII: Extraocular movements were full, visual field were full on confrontational test Head turning and shoulder shrug  were normal and symmetric.  Gait and station: Uses a walker   DIAGNOSTIC DATA (LABS, IMAGING, TESTING) - I reviewed patient records, labs, notes, testing and imaging myself where available.  Lab Results  Component Value Date   WBC 5.4 12/26/2024   HGB 12.7 12/26/2024   HCT 38.8 12/26/2024   MCV 99 (H) 12/26/2024   PLT 246 12/26/2024      Component  Value Date/Time   NA 133 (L) 12/26/2024 1541   K 4.8 12/26/2024 1541   CL 95 (L) 12/26/2024 1541   CO2 27 12/26/2024 1541   GLUCOSE 137 (H) 12/26/2024 1541   GLUCOSE 102 (H) 06/11/2023 1632   BUN 15 12/26/2024 1541   CREATININE 0.73 12/26/2024 1541   CREATININE 0.86 05/15/2013 0941   CALCIUM  10.6 (H) 12/26/2024 1541   PROT 6.2 12/26/2024 1541   ALBUMIN 4.1 12/26/2024 1541   AST 17 12/26/2024 1541   ALT 15 12/26/2024 1541   ALKPHOS 106 12/26/2024 1541   BILITOT 0.4 12/26/2024 1541   GFRNONAA >60 06/11/2023 1632   GFRNONAA 68 05/15/2013 0941   GFRAA 101 06/07/2020 1158   GFRAA 78 05/15/2013 0941   Lab Results  Component Value Date   CHOL 152 07/23/2024   HDL 56 07/23/2024   LDLCALC 70 07/23/2024   TRIG 152 (H) 07/23/2024   CHOLHDL 2.7 07/23/2024   Lab Results  Component Value Date   HGBA1C 5.4 10/24/2024   Lab Results  Component Value Date   VITAMINB12 653 08/15/2024   Lab Results  Component Value Date   TSH 2.420 10/24/2024      ASSESSMENT AND PLAN 84 y.o. year old female  has a past medical history of Anxiety, Arthritis, CHF (congestive heart failure) (HCC), Chronic pain of multiple joints, Depression, Essential hypertension, GERD (gastroesophageal reflux disease), Hyperlipidemia, Hyperlipidemia associated with type 2  diabetes mellitus (HCC), Hypertension associated with type 2 diabetes mellitus (HCC), Hypothyroidism, Mild scoliosis (10/19/2021), Osteoporosis, Sleep apnea, 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, left (09/05/2023), Squamous cell carcinoma of skin (09/05/2023), Type 2 diabetes mellitus with hypercholesterolemia (HCC), and Vitamin D  deficiency. here with:  OSA on CPAP  - CPAP compliance good - Good treatment of AHI  - Encourage patient to use CPAP nightly and > 4 hours each night -Uses supplemental oxygen  managed by pulmonology - F/U in 1 year or sooner if needed   Duwaine Russell, MSN, NP-C 01/01/2025, 12:55 PM [facility] [address] [place], [place] [address] [phone]  The patient's condition requires frequent monitoring and adjustments in the treatment plan, reflecting the ongoing complexity of care.  This provider is the continuing focal point for all needed services for this condition.  "

## 2025-01-01 NOTE — Patient Instructions (Signed)
Continue using CPAP nightly and greater than 4 hours each night Order placed for new machine If your symptoms worsen or you develop new symptoms please let us know.

## 2025-01-01 NOTE — Progress Notes (Signed)
 SABRA

## 2025-01-05 ENCOUNTER — Ambulatory Visit: Payer: Self-pay | Admitting: Family Medicine

## 2025-01-06 ENCOUNTER — Ambulatory Visit: Admitting: Nurse Practitioner

## 2025-01-08 ENCOUNTER — Telehealth: Payer: Self-pay | Admitting: *Deleted

## 2025-01-08 DIAGNOSIS — Z9981 Dependence on supplemental oxygen: Secondary | ICD-10-CM

## 2025-01-08 DIAGNOSIS — G4733 Obstructive sleep apnea (adult) (pediatric): Secondary | ICD-10-CM

## 2025-01-08 NOTE — Telephone Encounter (Signed)
 Copied from CRM [id number]. Topic: General - Other >> Jan 07, 2025  3:13 PM Victoria B wrote: Reason for CRM: Crystal from [place], asking for visit of social worker to patient

## 2025-01-08 NOTE — Telephone Encounter (Signed)
 TCB to Crystal w/ [facility] - VO given to add child psychotherapist

## 2025-01-08 NOTE — Telephone Encounter (Signed)
 Per [facility] pt insurance OON will change patient to [employer] . Pt aware gave pt [employer] # sent over orders this afternoon Pt thanked me for calling

## 2025-01-08 NOTE — Telephone Encounter (Signed)
 Noted

## 2025-01-08 NOTE — Telephone Encounter (Signed)
 Her O2 is managed my pulmonology

## 2025-01-12 ENCOUNTER — Encounter: Payer: Self-pay | Admitting: Nurse Practitioner

## 2025-01-12 ENCOUNTER — Ambulatory Visit: Attending: Nurse Practitioner | Admitting: Nurse Practitioner

## 2025-01-12 VITALS — BP 142/78 | HR 73 | Ht 62.0 in | Wt 176.2 lb

## 2025-01-12 DIAGNOSIS — G4733 Obstructive sleep apnea (adult) (pediatric): Secondary | ICD-10-CM | POA: Diagnosis not present

## 2025-01-12 DIAGNOSIS — I5032 Chronic diastolic (congestive) heart failure: Secondary | ICD-10-CM

## 2025-01-12 DIAGNOSIS — I4891 Unspecified atrial fibrillation: Secondary | ICD-10-CM

## 2025-01-12 DIAGNOSIS — E785 Hyperlipidemia, unspecified: Secondary | ICD-10-CM | POA: Diagnosis not present

## 2025-01-12 DIAGNOSIS — I10 Essential (primary) hypertension: Secondary | ICD-10-CM

## 2025-01-12 DIAGNOSIS — J9611 Chronic respiratory failure with hypoxia: Secondary | ICD-10-CM | POA: Diagnosis not present

## 2025-01-12 DIAGNOSIS — I350 Nonrheumatic aortic (valve) stenosis: Secondary | ICD-10-CM

## 2025-01-12 DIAGNOSIS — E669 Obesity, unspecified: Secondary | ICD-10-CM | POA: Diagnosis not present

## 2025-01-12 DIAGNOSIS — R001 Bradycardia, unspecified: Secondary | ICD-10-CM

## 2025-01-12 NOTE — Patient Instructions (Signed)

## 2025-01-12 NOTE — Progress Notes (Signed)
 " Cardiology Office Note:  .   Date: 01/12/2025 ID:  Danielle Parrish, DOB [birth year], MRN [id number] PCP: Dettinger, Fonda LABOR, MD  [facility] Providers Cardiologist:  Diannah SHAUNNA Maywood, MD    History of Present Illness: .   Danielle Parrish is a 84 y.o. female with a PMH of PAF, hypertension, hyperlipidemia, type 2 diabetes, chronic hypoxic respiratory failure on oxygen , OSA on CPAP, who presents today for hospital follow-up.   Newly diagnosed with A-fib in June 2024 while in the pulmonology office.  She was sent to the ED, spontaneously converted to normal sinus rhythm.  Last seen by Dr. Maywood on September 07, 2023.  She was overall doing well at the time, but pt did note symptomatic DOE.  2-week event monitor was arranged to rule out any intermittent episodes of A-fib.  Lasix  was increased to 40 mg daily.  01/15/2024 - Today she presents for 3 month follow-up. She states she never received the heart monitor after last office visit/wore a heart monitor. She notes recent stress over the past few months. Daughter passed away recently and holiday season was difficult she states, denies any red flag signs/symptoms. Says overall she is doing fairly well. States when she takes off her O2 temporarily, she notes her SpO2 saturation and HR dropping but then both return to normal after putting her O2 back on. Denies any chest pain, palpitations, syncope, presyncope, dizziness, orthopnea, PND, swelling or significant weight changes, acute bleeding, or claudication. Denies any recent worsening SHOB. Tells she she has recently joined an exercise class and enjoying this very much, also will be starting PT soon.  02/18/2024 - Presents today for follow-up. Admits to fatigue, unsure what this is due to.  She confirms compliance with CPAP. Denies any chest pain, worsening shortness of breath, palpitations, syncope, presyncope, dizziness, orthopnea, PND, swelling or significant weight changes, acute  bleeding, or claudication.  ED visit on 02/28/2024 at [facility] for fatigue. Labwork overall unremarkable. See most recent labs noted below.   04/24/2024 - Today she presents for follow-up. She states she still is noticing fatigue.  Confirms compliance with CPAP. Denies any chest pain, worsening shortness of breath, palpitations, syncope, presyncope, dizziness, orthopnea, PND, swelling or significant weight changes, acute bleeding, or claudication.  According to her report, this fatigue has been intermittent 2 to 3 years.  She particularly says fatigue is better today than it was yesterday.  02/28/2024 labs:  Cardiac Troponin T: 19, 16, 17 CBC: WBC 3.8, normal H/H CMET: unremarkable Mag: 1.5 proBNP: WNL TSH: WNL Respiratory viral panel: Negative  Last seen by Lorette Kapur, PA-C on 08/07/2024.  She noted ongoing worsening fatigue since previous office visit.  Reported home heart rate throughout the day could get as low as 40 bpm.  Toprol -XL was decreased to 12.5 mg daily due to reported low heart rate and fatigue, recommended to consider switching to bisoprolol due to her chronic lung disease.  10/06/2024 -  Here for follow-up. Continues to note stable fatigue and nocturnal bradycardia since last office visit, states she hasn't noticed difference since medication was changed at last office visit. Does tell me she has been able to do more work around the house without getting tired or needing to take a break. Denies any chest pain, shortness of breath, palpitations, syncope, presyncope, dizziness, orthopnea, PND, swelling or significant weight changes, acute bleeding, or claudication. Does notice some weight gain.   Since last visit, she was hospitalized at [facility] and prior to  admission, sustained 5 separant falls leading to seevere back pain. Noted to have chronic lumbar compression fractures, s/p cement augmentation. MRI revealed 3 acute fractures at T11 associated with dorsal epidural hematoma  w/o spinal cord compression and/or abnormal cord signal. Neurosurgery deemed pt not to be candidate for intervention.   She wore a cardiac monitor 10/2024 - see report below. Echo 10/2024 showed normal LVEF, see full report. Was d/c to SNF.   She is here for hospital follow-up today. States her falls were mechanical falls. Currently wearing brace, Denies any chest pain, shortness of breath, palpitations, syncope, presyncope, dizziness, orthopnea, PND, swelling or significant weight changes, acute bleeding, or claudication.  ROS: Negative. See HPI.   Studies Reviewed: SABRA    EKG: EKG is not ordered today.  Echo 10/2024 ([facility]): The study was technically difficult.    Left Ventricle: Left ventricle size is normal. Systolic function is  normal. EF: 60-65%. Quantitative analysis of left ventricular Global  Longitudinal Strain (GLS) imaging is -17.400%.    Right Ventricle: Right ventricle size is normal. Systolic function is  mildly reduced.  Cardiac monitor 10/2024:   Patch wear time was for 7 days and 2 hours.   Normal sinus rhythm predominantly ranging from 43 to 197 bpm with an average HR 63 bpm.   16 runs of nonsustained SVT occurred, fastest/longest interval lasting 10 beats.  1 run of NSVT occurred lasting 3 beats.   No evidence of atrial fibrillation/flutter, high-grade AV block or pauses.   <1% PAC burden and <1% PVC burden.   No patient triggered events were noted.  Echo 02/2024:  1. Left ventricular ejection fraction, by estimation, is 55 to 60%. Left  ventricular ejection fraction by 3D volume is 56 %. The left ventricle has  normal function. Left ventricular endocardial border not optimally defined to evaluate regional wall  motion. Left ventricular diastolic parameters are consistent with Grade I  diastolic dysfunction (impaired relaxation). The average left ventricular  global longitudinal strain is -14.3 %.   2. Right ventricular systolic function is normal. The right  ventricular  size is normal. Tricuspid regurgitation signal is inadequate for assessing  PA pressure.   3. The mitral valve is normal in structure. No evidence of mitral valve  regurgitation. No evidence of mitral stenosis.   4. The aortic valve has an indeterminant number of cusps. Aortic valve  regurgitation is not visualized. Mild aortic valve stenosis. Aortic valve  mean gradient measures 8.0 mmHg. Aortic valve Vmax measures 2.02 m/s.   5. The inferior vena cava is normal in size with greater than 50%  respiratory variability, suggesting right atrial pressure of 3 mmHg.   6. Increased flow velocities may be secondary to anemia, thyrotoxicosis,  hyperdynamic or high flow state.   Comparison(s): A prior study was performed on 07/11/2022. Changes from prior study are noted. AI is not visualized.  Cardiac monitor 01/2024:   Patch wear time was for 13 days and 23 hours.   Normal sinus rhythm predominantly ranging from 38 to 203 bpm with an average HR 65 bpm.   44 runs of SVT, fastest interval lasting 11 beats/3.6 seconds and the longest interval lasting 24 beats/12 seconds.   3 runs of NSVT, fastest interval lasting 4 beats and the longest interval lasting 6 beats.   No evidence of high-grade AV block or pauses.   <1% PAC burden and <1% PVC burden.   No patient triggered events noted.  Echo 06/2022:   1. Left ventricular ejection  fraction, by estimation, is 60 to 65%. The  left ventricle has normal function. The left ventricle has no regional  wall motion abnormalities. There is mild left ventricular hypertrophy.  Left ventricular diastolic parameters  are consistent with Grade I diastolic dysfunction (impaired relaxation).  The average left ventricular global longitudinal strain is -19.2 %. The  global longitudinal strain is normal.   2. Right ventricular systolic function is low normal. The right  ventricular size is normal. There is normal pulmonary artery systolic  pressure. The  estimated right ventricular systolic pressure is 7.2 mmHg.   3. The mitral valve is degenerative. Trivial mitral valve regurgitation.   4. The aortic valve is tricuspid. There is mild calcification of the  aortic valve. Aortic valve regurgitation is mild. Mild aortic valve  stenosis. Aortic regurgitation PHT measures 1034 msec. Aortic valve area,  by VTI measures 1.86 cm. Aortic valve  mean gradient measures 9.0 mmHg.   5. The inferior vena cava is normal in size with greater than 50%  respiratory variability, suggesting right atrial pressure of 3 mmHg.   Comparison(s): Prior images unable to be directly viewed.  Holter monitor 09/2015:  48-hour Holter monitor reviewed. Sinus rhythm present with heart rate ranging from 36 bpm just briefly up to 90 bpm, average heart rate 56 bpm. Lowest sustained heart rate was in the mid 40s. Occasional PACs and PVCs were noted. Brief bursts of PAT ranging 5-7 beats also noted. There were no significant pauses, no atrial fibrillation. No obvious correlating symptoms described.   Physical Exam:   VS:  BP (!) 142/78 (BP Location: Right Arm)   Pulse 73   Ht 5' 2 (1.575 m)   Wt 176 lb 3.2 oz (79.9 kg)   SpO2 98%   BMI 32.23 kg/m    Wt Readings from Last 3 Encounters:  01/12/25 176 lb 3.2 oz (79.9 kg)  01/01/25 176 lb 12.8 oz (80.2 kg)  12/26/24 173 lb (78.5 kg)    GEN: Obese, 84 y.o. female in no acute distress, wearing chronic O2 NECK: No JVD; No carotid bruits CARDIAC: S1/S2, RRR, Grade 1/6 murmur, no rubs, no gallops RESPIRATORY:  Clear to auscultation without rales, wheezing or rhonchi  ABDOMEN: Soft, non-tender, non-distended EXTREMITIES:  No edema; No deformity   ASSESSMENT AND PLAN: .    HFpEF Stage C, NYHA class I-II symptoms.  EF from March 2025 was 55 to 60%, grade 1 DD.  Euvolemic and well compensated on exam. Continue current GDMT. Low sodium diet, fluid restriction <2L, and daily weights encouraged. Educated to contact our office for  weight gain of 2 lbs overnight or 5 lbs in one week.  A-fib, bradycardia Denies any tachycardia or palpitations.  HR is well controlled today. Continue Eliquis  5 mg BID for stroke prevention, denies any bleeding issues. On appropriate dosage. Continue Toprol  XL.   HTN Blood pressure stable. Discussed to monitor BP at home at least 2 hours after medications and sitting for 5-10 minutes.  No medication changes at this time. Heart healthy diet and regular cardiovascular exercise encouraged.   HLD LDL 70 06/2024.  This is being managed by PCP.  Continue lovastatin . Heart healthy diet and regular cardiovascular exercise encouraged.    4. Aortic valve stenosis Mild aortic valve stenosis noted on echocardiogram  from last year, Echo with [facility] in November 2025 was technically difficult study but no aortic stenosis noted on study. Will arrange to repeat Echo in May 2026.  Denies any concerning signs or symptoms.  5. Chronic hypoxic respiratory failure on oxygen , OSA on CPAP Denies any worsening symptoms.  Breathing is stable per her report.  She is compliant with CPAP.  Continue to follow-up with PCP and Dr. Darlean.   6. Obesity Weight loss via diet and exercise encouraged. Discussed the impact being overweight would have on cardiovascular risk.     Dispo: Follow-up with me/APP in 6 months or sooner anything changes.  Signed, Almarie Crate, NP   "

## 2025-01-22 ENCOUNTER — Telehealth: Payer: Self-pay | Admitting: Family Medicine

## 2025-01-22 NOTE — Telephone Encounter (Signed)
 Copied from CRM [id number]. Topic: Clinical - Home Health Verbal Orders >> Jan 22, 2025  1:23 PM Alfonso ORN wrote: Caller/Agency: Joesph (social service [facility]rt[facility] homehealth Callback Number:  [phone]  Service Requested:  was schedule to see patient for social Initial evaluation have to reschedule , requesting approval for see patient on 01/26/25  or that week  Frequency: N/A Any new concerns about the patient? No

## 2025-01-22 NOTE — Telephone Encounter (Signed)
 Verbal ok given to [place]. No further concerns.

## 2025-01-26 ENCOUNTER — Ambulatory Visit: Admitting: Family Medicine

## 2025-01-30 ENCOUNTER — Telehealth: Payer: Self-pay

## 2025-01-30 ENCOUNTER — Telehealth: Payer: Self-pay | Admitting: Family Medicine

## 2025-01-30 NOTE — Telephone Encounter (Signed)
 Copied from CRM [id number]. Topic: Clinical - Home Health Verbal Orders >> Jan 22, 2025  1:23 PM Alfonso ORN wrote: Caller/Agency: Joesph (social service [facility]rt[facility] homehealth Callback Number:  [phone]  Service Requested:  was schedule to see patient for social Initial evaluation have to reschedule , requesting approval for see patient on 01/26/25  or that week  Frequency: N/A Any new concerns about the patient? No

## 2025-01-30 NOTE — Telephone Encounter (Signed)
 Called to give verbal ok had to leave message

## 2025-01-30 NOTE — Telephone Encounter (Signed)
 Pt needs appt for qualifying walk and if she is wanting a neb and medication per faxes I have received.

## 2025-01-30 NOTE — Telephone Encounter (Signed)
 Copied from CRM [id number]. Topic: Clinical - Home Health Verbal Orders >> Jan 22, 2025  1:23 PM Alfonso ORN wrote: Caller/Agency: Joesph ([facility] service [facility] homehealth Callback Number:  [phone]  Service Requested:  was schedule to see patient for social Initial evaluation have to reschedule , requesting approval for see patient on 01/26/25  or that week  Frequency: N/A Any new concerns about the patient? No >> Jan 30, 2025  1:23 PM Susanna ORN wrote: Joesph, social worker with [facility], called stating that she was scheduled to see patient today for evaluation but unfortunately she has gotten sick. She states she needs to delay the evaluation to the week of February 9th. She has already spoken with patient and patient states that would work for her as well. CB #: [id number].

## 2025-01-30 NOTE — Telephone Encounter (Signed)
 Verbal given.

## 2025-01-30 NOTE — Telephone Encounter (Signed)
 Patient scheduled.

## 2025-02-16 ENCOUNTER — Ambulatory Visit: Admitting: Dermatology

## 2025-02-17 ENCOUNTER — Ambulatory Visit: Admitting: Dermatology

## 2025-03-10 ENCOUNTER — Ambulatory Visit: Admitting: Internal Medicine

## 2025-03-12 ENCOUNTER — Ambulatory Visit

## 2025-08-18 ENCOUNTER — Ambulatory Visit: Payer: Self-pay
